# Patient Record
Sex: Female | Born: 1935 | Race: White | Hispanic: No | Smoking: Former smoker
Health system: Southern US, Community
[De-identification: ages and names within clinical notes are randomized; demographics above are authoritative.]

## PROBLEM LIST (undated history)

## (undated) DIAGNOSIS — K649 Unspecified hemorrhoids: Secondary | ICD-10-CM

## (undated) DIAGNOSIS — M1990 Unspecified osteoarthritis, unspecified site: Secondary | ICD-10-CM

## (undated) DIAGNOSIS — R32 Unspecified urinary incontinence: Secondary | ICD-10-CM

## (undated) DIAGNOSIS — L570 Actinic keratosis: Secondary | ICD-10-CM

## (undated) DIAGNOSIS — E785 Hyperlipidemia, unspecified: Secondary | ICD-10-CM

## (undated) DIAGNOSIS — C182 Malignant neoplasm of ascending colon: Secondary | ICD-10-CM

## (undated) DIAGNOSIS — R112 Nausea with vomiting, unspecified: Secondary | ICD-10-CM

## (undated) DIAGNOSIS — Z9889 Other specified postprocedural states: Secondary | ICD-10-CM

## (undated) DIAGNOSIS — G2581 Restless legs syndrome: Secondary | ICD-10-CM

## (undated) DIAGNOSIS — K222 Esophageal obstruction: Secondary | ICD-10-CM

## (undated) DIAGNOSIS — G709 Myoneural disorder, unspecified: Secondary | ICD-10-CM

## (undated) DIAGNOSIS — G4700 Insomnia, unspecified: Secondary | ICD-10-CM

## (undated) DIAGNOSIS — R252 Cramp and spasm: Secondary | ICD-10-CM

## (undated) DIAGNOSIS — K219 Gastro-esophageal reflux disease without esophagitis: Secondary | ICD-10-CM

## (undated) DIAGNOSIS — I10 Essential (primary) hypertension: Secondary | ICD-10-CM

## (undated) DIAGNOSIS — H409 Unspecified glaucoma: Secondary | ICD-10-CM

## (undated) DIAGNOSIS — F329 Major depressive disorder, single episode, unspecified: Secondary | ICD-10-CM

## (undated) DIAGNOSIS — K5909 Other constipation: Secondary | ICD-10-CM

## (undated) DIAGNOSIS — K5790 Diverticulosis of intestine, part unspecified, without perforation or abscess without bleeding: Secondary | ICD-10-CM

## (undated) DIAGNOSIS — F32A Depression, unspecified: Secondary | ICD-10-CM

## (undated) DIAGNOSIS — Z8719 Personal history of other diseases of the digestive system: Secondary | ICD-10-CM

## (undated) DIAGNOSIS — H3530 Unspecified macular degeneration: Secondary | ICD-10-CM

## (undated) DIAGNOSIS — C4490 Unspecified malignant neoplasm of skin, unspecified: Secondary | ICD-10-CM

## (undated) DIAGNOSIS — J449 Chronic obstructive pulmonary disease, unspecified: Secondary | ICD-10-CM

## (undated) DIAGNOSIS — H7390 Unspecified disorder of tympanic membrane, unspecified ear: Secondary | ICD-10-CM

## (undated) DIAGNOSIS — Z7982 Long term (current) use of aspirin: Secondary | ICD-10-CM

## (undated) HISTORY — PX: INTRAOPERATIVE ARTERIOGRAM: SHX5157

## (undated) HISTORY — PX: HEMORROIDECTOMY: SUR656

## (undated) HISTORY — PX: COLECTOMY: SHX59

## (undated) HISTORY — PX: MASTOID DEBRIDEMENT: SHX2002

## (undated) HISTORY — PX: CATARACT EXTRACTION: SUR2

## (undated) HISTORY — PX: CARDIAC CATHETERIZATION: SHX172

## (undated) HISTORY — PX: TONSILLECTOMY: SUR1361

## (undated) HISTORY — PX: LEG SURGERY: SHX1003

## (undated) HISTORY — PX: FOOT SURGERY: SHX648

## (undated) HISTORY — PX: CHOLECYSTECTOMY: SHX55

## (undated) HISTORY — PX: INNER EAR SURGERY: SHX679

## (undated) HISTORY — PX: TOE FUSION: SHX1070

## (undated) HISTORY — PX: SKIN CANCER EXCISION: SHX779

## (undated) HISTORY — PX: FINGER NAIL SURGERY: SHX717

## (undated) HISTORY — PX: BLADDER REPAIR: SHX76

## (undated) HISTORY — PX: ABDOMINAL HYSTERECTOMY: SHX81

## (undated) HISTORY — PX: KNEE SURGERY: SHX244

---

## 1985-12-14 DIAGNOSIS — K222 Esophageal obstruction: Secondary | ICD-10-CM

## 1985-12-14 HISTORY — DX: Esophageal obstruction: K22.2

## 2000-10-06 ENCOUNTER — Encounter: Admission: RE | Admit: 2000-10-06 | Discharge: 2000-10-06 | Payer: Self-pay | Admitting: Otolaryngology

## 2000-10-06 ENCOUNTER — Encounter: Payer: Self-pay | Admitting: Otolaryngology

## 2000-12-21 ENCOUNTER — Observation Stay (HOSPITAL_COMMUNITY): Admission: RE | Admit: 2000-12-21 | Discharge: 2000-12-22 | Payer: Self-pay | Admitting: Urology

## 2001-05-24 ENCOUNTER — Encounter: Payer: Self-pay | Admitting: Internal Medicine

## 2001-05-24 ENCOUNTER — Ambulatory Visit (HOSPITAL_COMMUNITY): Admission: RE | Admit: 2001-05-24 | Discharge: 2001-05-24 | Payer: Self-pay | Admitting: Internal Medicine

## 2001-10-25 ENCOUNTER — Encounter: Payer: Self-pay | Admitting: Internal Medicine

## 2001-10-25 ENCOUNTER — Encounter: Admission: RE | Admit: 2001-10-25 | Discharge: 2001-10-25 | Payer: Self-pay | Admitting: Internal Medicine

## 2001-11-22 ENCOUNTER — Ambulatory Visit (HOSPITAL_COMMUNITY): Admission: RE | Admit: 2001-11-22 | Discharge: 2001-11-22 | Payer: Self-pay | Admitting: Cardiology

## 2002-01-04 ENCOUNTER — Encounter: Payer: Self-pay | Admitting: Orthopedic Surgery

## 2002-01-04 ENCOUNTER — Encounter: Admission: RE | Admit: 2002-01-04 | Discharge: 2002-01-04 | Payer: Self-pay | Admitting: Orthopedic Surgery

## 2002-04-18 ENCOUNTER — Ambulatory Visit (HOSPITAL_COMMUNITY): Admission: RE | Admit: 2002-04-18 | Discharge: 2002-04-18 | Payer: Self-pay | Admitting: *Deleted

## 2003-03-27 ENCOUNTER — Ambulatory Visit (HOSPITAL_BASED_OUTPATIENT_CLINIC_OR_DEPARTMENT_OTHER): Admission: RE | Admit: 2003-03-27 | Discharge: 2003-03-28 | Payer: Self-pay | Admitting: Orthopedic Surgery

## 2003-11-28 ENCOUNTER — Ambulatory Visit (HOSPITAL_COMMUNITY): Admission: RE | Admit: 2003-11-28 | Discharge: 2003-11-28 | Payer: Self-pay | Admitting: *Deleted

## 2004-07-16 ENCOUNTER — Ambulatory Visit (HOSPITAL_COMMUNITY): Admission: RE | Admit: 2004-07-16 | Discharge: 2004-07-16 | Payer: Self-pay | Admitting: Orthopedic Surgery

## 2004-07-16 ENCOUNTER — Ambulatory Visit (HOSPITAL_BASED_OUTPATIENT_CLINIC_OR_DEPARTMENT_OTHER): Admission: RE | Admit: 2004-07-16 | Discharge: 2004-07-16 | Payer: Self-pay | Admitting: Orthopedic Surgery

## 2004-07-16 ENCOUNTER — Encounter (INDEPENDENT_AMBULATORY_CARE_PROVIDER_SITE_OTHER): Payer: Self-pay | Admitting: Orthopedic Surgery

## 2004-10-29 ENCOUNTER — Encounter: Admission: RE | Admit: 2004-10-29 | Discharge: 2004-10-29 | Payer: Self-pay | Admitting: Internal Medicine

## 2004-11-12 ENCOUNTER — Encounter: Admission: RE | Admit: 2004-11-12 | Discharge: 2004-11-12 | Payer: Self-pay | Admitting: Internal Medicine

## 2005-06-24 ENCOUNTER — Ambulatory Visit (HOSPITAL_BASED_OUTPATIENT_CLINIC_OR_DEPARTMENT_OTHER): Admission: RE | Admit: 2005-06-24 | Discharge: 2005-06-24 | Payer: Self-pay | Admitting: Orthopedic Surgery

## 2005-06-24 ENCOUNTER — Encounter (INDEPENDENT_AMBULATORY_CARE_PROVIDER_SITE_OTHER): Payer: Self-pay | Admitting: *Deleted

## 2005-06-24 ENCOUNTER — Ambulatory Visit (HOSPITAL_COMMUNITY): Admission: RE | Admit: 2005-06-24 | Discharge: 2005-06-24 | Payer: Self-pay | Admitting: Orthopedic Surgery

## 2005-06-30 ENCOUNTER — Ambulatory Visit (HOSPITAL_COMMUNITY): Admission: RE | Admit: 2005-06-30 | Discharge: 2005-06-30 | Payer: Self-pay | Admitting: Internal Medicine

## 2005-07-08 ENCOUNTER — Ambulatory Visit (HOSPITAL_COMMUNITY): Admission: RE | Admit: 2005-07-08 | Discharge: 2005-07-08 | Payer: Self-pay | Admitting: Orthopedic Surgery

## 2005-07-20 ENCOUNTER — Encounter: Admission: RE | Admit: 2005-07-20 | Discharge: 2005-07-20 | Payer: Self-pay | Admitting: Orthopedic Surgery

## 2005-07-23 ENCOUNTER — Ambulatory Visit (HOSPITAL_BASED_OUTPATIENT_CLINIC_OR_DEPARTMENT_OTHER): Admission: RE | Admit: 2005-07-23 | Discharge: 2005-07-23 | Payer: Self-pay | Admitting: Orthopedic Surgery

## 2005-07-23 ENCOUNTER — Encounter (INDEPENDENT_AMBULATORY_CARE_PROVIDER_SITE_OTHER): Payer: Self-pay | Admitting: Specialist

## 2005-07-23 ENCOUNTER — Ambulatory Visit (HOSPITAL_COMMUNITY): Admission: RE | Admit: 2005-07-23 | Discharge: 2005-07-23 | Payer: Self-pay | Admitting: Orthopedic Surgery

## 2006-01-11 ENCOUNTER — Ambulatory Visit: Payer: Self-pay | Admitting: Hematology & Oncology

## 2006-09-15 ENCOUNTER — Ambulatory Visit (HOSPITAL_BASED_OUTPATIENT_CLINIC_OR_DEPARTMENT_OTHER): Admission: RE | Admit: 2006-09-15 | Discharge: 2006-09-15 | Payer: Self-pay | Admitting: Orthopedic Surgery

## 2006-09-15 ENCOUNTER — Encounter (INDEPENDENT_AMBULATORY_CARE_PROVIDER_SITE_OTHER): Payer: Self-pay | Admitting: Specialist

## 2008-03-12 ENCOUNTER — Ambulatory Visit (HOSPITAL_COMMUNITY): Admission: RE | Admit: 2008-03-12 | Discharge: 2008-03-12 | Payer: Self-pay | Admitting: Internal Medicine

## 2008-04-04 ENCOUNTER — Ambulatory Visit: Payer: Self-pay | Admitting: Vascular Surgery

## 2008-06-29 ENCOUNTER — Ambulatory Visit: Payer: Self-pay | Admitting: Vascular Surgery

## 2008-07-25 ENCOUNTER — Ambulatory Visit: Payer: Self-pay | Admitting: Vascular Surgery

## 2008-08-01 ENCOUNTER — Ambulatory Visit: Payer: Self-pay | Admitting: Vascular Surgery

## 2008-10-24 ENCOUNTER — Ambulatory Visit: Payer: Self-pay | Admitting: Vascular Surgery

## 2008-10-31 ENCOUNTER — Ambulatory Visit: Payer: Self-pay | Admitting: Vascular Surgery

## 2008-11-16 ENCOUNTER — Ambulatory Visit: Payer: Self-pay | Admitting: Vascular Surgery

## 2008-11-28 ENCOUNTER — Encounter (INDEPENDENT_AMBULATORY_CARE_PROVIDER_SITE_OTHER): Payer: Self-pay | Admitting: *Deleted

## 2008-11-28 ENCOUNTER — Inpatient Hospital Stay (HOSPITAL_COMMUNITY): Admission: EM | Admit: 2008-11-28 | Discharge: 2008-11-30 | Payer: Self-pay | Admitting: Emergency Medicine

## 2009-03-10 ENCOUNTER — Inpatient Hospital Stay (HOSPITAL_COMMUNITY): Admission: EM | Admit: 2009-03-10 | Discharge: 2009-03-12 | Payer: Self-pay | Admitting: Emergency Medicine

## 2010-01-01 ENCOUNTER — Ambulatory Visit (HOSPITAL_BASED_OUTPATIENT_CLINIC_OR_DEPARTMENT_OTHER): Admission: RE | Admit: 2010-01-01 | Discharge: 2010-01-01 | Payer: Self-pay | Admitting: Orthopedic Surgery

## 2010-02-04 ENCOUNTER — Encounter: Admission: RE | Admit: 2010-02-04 | Discharge: 2010-02-04 | Payer: Self-pay | Admitting: Internal Medicine

## 2010-04-17 ENCOUNTER — Ambulatory Visit (HOSPITAL_COMMUNITY): Admission: RE | Admit: 2010-04-17 | Discharge: 2010-04-17 | Payer: Self-pay | Admitting: Orthopedic Surgery

## 2010-10-30 ENCOUNTER — Encounter: Admission: RE | Admit: 2010-10-30 | Discharge: 2010-10-30 | Payer: Self-pay | Admitting: Orthopedic Surgery

## 2010-11-04 ENCOUNTER — Ambulatory Visit (HOSPITAL_BASED_OUTPATIENT_CLINIC_OR_DEPARTMENT_OTHER): Admission: RE | Admit: 2010-11-04 | Discharge: 2010-11-04 | Payer: Self-pay | Admitting: Orthopedic Surgery

## 2010-12-30 ENCOUNTER — Other Ambulatory Visit: Payer: Self-pay | Admitting: Dermatology

## 2011-01-03 ENCOUNTER — Encounter: Payer: Self-pay | Admitting: Internal Medicine

## 2011-01-04 ENCOUNTER — Encounter: Payer: Self-pay | Admitting: Internal Medicine

## 2011-02-02 NOTE — Op Note (Signed)
NAMEKAREMA, TOCCI               ACCOUNT NO.:  [id number]  MEDICAL RECORD NO.:  [id number]          PATIENT TYPE:  AMB  LOCATION:  [place]                          FACILITY:  [facility]  PHYSICIAN:  Daryll Brod, M.D.       DATE OF BIRTH:  1936/09/13  DATE OF PROCEDURE:  11/04/2010 DATE OF DISCHARGE:                              OPERATIVE REPORT   PREOPERATIVE DIAGNOSIS:  Lesion in left thumb nail.  POSTOPERATIVE DIAGNOSIS:  Lesion in left thumb nail.  OPERATION:  Excision of lesion left thumb with full-thickness skin graft, ablation of nail matrix.  SURGEON:  Daryll Brod, MD  ASSISTANT:  Dimas Millin, RN  ANESTHESIA:  General with local infiltration.  ANESTHESIOLOGIST:  Lorrene Reid, MD  HISTORY:  The patient is a 75 year old female with a history of a mass on her left thumb partially beneath the nail plate.  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  She is admitted now for further excision with skin grafting, ablation of the nail matrix, left thumb.  She is well aware of risks and complications including infection, recurrence injury to arteries, nerves, tendons, incomplete relief of symptoms, dystrophy, possibility that this may have transformed into a squamous cell carcinoma that she has had the similar occurrence of that problem on her right thigh.  The patient was seen in the preoperative area, the extremity marked by both the patient and surgeon.  Antibiotic given.  PROCEDURE:  The patient was brought to the operating room where a general anesthetic was carried out without difficulty.  She was prepped using ChloraPrep, supine position with the left arm free.  A 3-minute dry time was allowed.  Time-out taken confirming the patient and procedure.  The limb was exsanguinated from the wrist proximally with an Esmarch bandage.  Tourniquet placed high and the arm was inflated to 250 mmHg.  The nail plate was removed.  The nail matrix and tumor were removed en  bloc with a margin of approximately 5 mm around the entire tumor.  This was taken down to a thin layer of tissue over the bone proximally and distally taking care to try to remove the entire tumor as much as possible.  A template was then made with an Esmarch bandage. This was cleaned.  The instruments were changed.  A full-thickness skin graft was made from the upper inner arm after creating the template, changing this to an ellipse.  This was taken as a full-thickness skin graft.  Bleeders were electrocauterized with bipolar.  The skin undermined and closed with interrupted 4-0 Vicryl Rapide sutures.  The thumb tip was then copiously irrigated with saline.  Bleeders electrocauterized.  The thumb graft was then sutured into position with interrupted 6-0 chromic and then completed with a running 6-0 chromic around the entire margin.  A stent dressing was then applied over nonadherent gauze, maintained position with Steri-Strips.  Compressive dressing and splint was applied.  A sterile compressive dressing was applied to the graft site.  Upon deflation of the tourniquet, the remaining fingers pinked and she was taken to the recovery room for observation in a satisfactory  condition.  Specimen is sent in toto to Pathology.          ______________________________ Daryll Brod, M.D.     GK/MEDQ  D:  11/04/2010  T:  11/04/2010  Job:  [id number]  Electronically Signed by Daryll Brod M.D. on 02/02/2011 12:26:04 PM

## 2011-02-24 LAB — BASIC METABOLIC PANEL
BUN: 18 mg/dL (ref 6–23)
CO2: 26 mEq/L (ref 19–32)
Chloride: 104 mEq/L (ref 96–112)
Creatinine, Ser: 1.08 mg/dL (ref 0.4–1.2)
Glucose, Bld: 94 mg/dL (ref 70–99)

## 2011-03-01 LAB — BASIC METABOLIC PANEL
BUN: 24 mg/dL — ABNORMAL HIGH (ref 6–23)
CO2: 28 mEq/L (ref 19–32)
Chloride: 105 mEq/L (ref 96–112)
Creatinine, Ser: 0.98 mg/dL (ref 0.4–1.2)
Glucose, Bld: 91 mg/dL (ref 70–99)

## 2011-03-03 LAB — PROTIME-INR: Prothrombin Time: 13.5 seconds (ref 11.6–15.2)

## 2011-03-03 LAB — BASIC METABOLIC PANEL
BUN: 23 mg/dL (ref 6–23)
CO2: 26 mEq/L (ref 19–32)
Chloride: 106 mEq/L (ref 96–112)
Creatinine, Ser: 0.92 mg/dL (ref 0.4–1.2)
Potassium: 3.3 mEq/L — ABNORMAL LOW (ref 3.5–5.1)

## 2011-03-03 LAB — CBC
HCT: 34.3 % — ABNORMAL LOW (ref 36.0–46.0)
MCHC: 35.2 g/dL (ref 30.0–36.0)
MCV: 95 fL (ref 78.0–100.0)
Platelets: 256 10*3/uL (ref 150–400)

## 2011-03-26 LAB — CBC
Hemoglobin: 11.8 g/dL — ABNORMAL LOW (ref 12.0–15.0)
MCHC: 34.6 g/dL (ref 30.0–36.0)
RBC: 3.64 MIL/uL — ABNORMAL LOW (ref 3.87–5.11)
RBC: 3.76 MIL/uL — ABNORMAL LOW (ref 3.87–5.11)
RDW: 13.2 % (ref 11.5–15.5)
WBC: 7.6 10*3/uL (ref 4.0–10.5)

## 2011-03-26 LAB — DIFFERENTIAL
Eosinophils Absolute: 0 10*3/uL (ref 0.0–0.7)
Eosinophils Relative: 0 % (ref 0–5)
Lymphocytes Relative: 30 % (ref 12–46)
Lymphs Abs: 0.5 10*3/uL — ABNORMAL LOW (ref 0.7–4.0)
Monocytes Absolute: 0.7 10*3/uL (ref 0.1–1.0)
Monocytes Relative: 0 % — ABNORMAL LOW (ref 3–12)
Monocytes Relative: 9 % (ref 3–12)
Neutro Abs: 4.3 10*3/uL (ref 1.7–7.7)

## 2011-03-26 LAB — POCT I-STAT, CHEM 8
BUN: 22 mg/dL (ref 6–23)
Creatinine, Ser: 1.3 mg/dL — ABNORMAL HIGH (ref 0.4–1.2)
Glucose, Bld: 97 mg/dL (ref 70–99)
Potassium: 3.9 mEq/L (ref 3.5–5.1)
TCO2: 26 mmol/L (ref 0–100)

## 2011-03-26 LAB — URINALYSIS, ROUTINE W REFLEX MICROSCOPIC
Bilirubin Urine: NEGATIVE
Nitrite: NEGATIVE
Specific Gravity, Urine: 1.02 (ref 1.005–1.030)
pH: 5.5 (ref 5.0–8.0)

## 2011-03-26 LAB — POCT CARDIAC MARKERS
CKMB, poc: <1 ng/mL — ABNORMAL LOW (ref 1.0–8.0)
Troponin i, poc: <0.05 ng/mL (ref 0.00–0.09)

## 2011-03-26 LAB — HEMOGLOBIN A1C: Mean Plasma Glucose: 94 mg/dL

## 2011-03-26 LAB — COMPREHENSIVE METABOLIC PANEL
ALT: 15 U/L (ref 0–35)
AST: 24 U/L (ref 0–37)
CO2: 23 mEq/L (ref 19–32)
Calcium: 9.2 mg/dL (ref 8.4–10.5)
GFR calc Af Amer: 52 mL/min — ABNORMAL LOW (ref >60)
Potassium: 4.5 mEq/L (ref 3.5–5.1)
Sodium: 137 mEq/L (ref 135–145)
Total Protein: 6.7 g/dL (ref 6.0–8.3)

## 2011-03-26 LAB — CARDIAC PANEL(CRET KIN+CKTOT+MB+TROPI)
CK, MB: 0.7 ng/mL (ref 0.3–4.0)
Relative Index: INVALID (ref 0.0–2.5)
Total CK: 24 U/L (ref 7–177)

## 2011-03-26 LAB — LIPID PANEL
Cholesterol: 151 mg/dL (ref 0–200)
HDL: 26 mg/dL — ABNORMAL LOW (ref >39)
LDL Cholesterol: 95 mg/dL (ref 0–99)
Total CHOL/HDL Ratio: 5.8 RATIO
Triglycerides: 149 mg/dL (ref <150)

## 2011-03-31 ENCOUNTER — Other Ambulatory Visit: Payer: Self-pay | Admitting: Dermatology

## 2011-04-28 NOTE — H&P (Signed)
Erica Escobar, Erica Escobar               ACCOUNT NO.:  [id number]   MEDICAL RECORD NO.:  [id number]          PATIENT TYPE:  EMS   LOCATION:  ED                           FACILITY:  [facility]   PHYSICIAN:  Barbette Merino, M.D.      DATE OF BIRTH:  Jun 01, 1936   DATE OF ADMISSION:  11/26/2008  DATE OF DISCHARGE:                              HISTORY & PHYSICAL   PRIMARY CARE PHYSICIAN:  Dr. Deland Pretty.   CARDIOLOGIST:  Dr. Glade Lloyd.   PRESENTING COMPLAINT:  Left-sided chest pain for 2 days.   HISTORY OF PRESENT ILLNESS:  The patient is a 75 year old female with  history of hypertension and dyslipidemia as well as gastroesophageal  reflux disease who has been having chest pain for the past 2 days.  The  chest pain is mainly left-sided.  It has been coming on and off  apparently for a long time, usually nonexertional, but occasionally  exertional as well.  Two days ago when it came, it was mainly left-sided  rated as 4-5/10.  It started from the epigastric region, and then  radiated down to her back along the sides of her rib cage.  It was  associated with some diaphoresis and nausea.  The patient spent all of  yesterday in bed, and decided to go to see her allergist today thinking  this was asthma.  She was seen and evaluated by her allergist who  actually sent her to the ER for evaluation of chest pain.  She had a  similar episode 2 years ago at which point she was evaluated by Dr.  Glade Lloyd, and had a cardiac cath done that was cleared according to  patient.  She has some risk factors for coronary artery disease  including hypertension, dyslipidemia.  Also had some family history of  heart disease, although this is advanced age.  She denied any tobacco  use.   ALLERGIES:  She is allergic to CODEINE, PENICILLIN, VICODIN, and  TIZANIDINE.   MEDICATIONS:  1. Gemfibrozil 600 mg p.o. b.i.d.  2. Amiloride 5 mg p.o. q.h.s.  3. Ropinirole 1 mg daily.  4. Potassium chloride 10 mEq daily.  5.  Lasix 40 mg daily.  6. Omeprazole 20 mg daily.  7. Aspirin 81 mg daily.  8. Fish oil.  9. Vitamin B12 liver.  10.Albuterol inhaler.  11.Aleve as needed.   SOCIAL HISTORY:  The patient is married, lives in [place], [place], with her husband.  Denied any to tobacco, alcohol or IV drug  use.   FAMILY HISTORY:  Both parents have history of heart disease, but at  advanced ages.  Mom was diabetic.  Father had some lung disease.  They  are both deceased.   REVIEW OF SYSTEMS:  A 14-point review of system attempted and  essentially negative except per HPI.   PHYSICAL EXAMINATION:  Temperature is 97.4, initial blood pressure  172/88 currently 146/90, pulse 66, respiratory rate 20, sats 98% on room  air.  GENERAL:  She is awake, alert, oriented, in no acute distress.  HEENT:  PERRL, EOMI.  NECK:  Supple.  No JVD, no lymphadenopathy.  RESPIRATORY:  Shows good air entry bilaterally, no wheezes, no rales.  CARDIOVASCULAR:  She has S1, S2, no murmurs.  ABDOMEN:  Soft, nontender with positive bowel sounds.  EXTREMITIES:  Show no edema, cyanosis or clubbing.   LABORATORIES:  White count is 11.7, hemoglobin 12.8 with platelet count  325 with an ANC of 8.6.  Initial cardiac enzymes negative.  Sodium 138,  potassium 3.9, chloride 101, CO2 29, glucose 110, BUN 24, creatinine  1.08.  Calcium 8.8.   Her chest x-ray showed no acute findings except for mild cardiomegaly.  EKG showed a rate of 66 with regular intervals.  Possible evidence of  LVH but poor voltage or other low voltage.  She had nonspecific ST-T  wave changes mainly flattening of her T-waves in the lateral wall, but  there is no old EKG to compare.   ASSESSMENT:  1. Therefore this is a 75 year old female presenting with nonspecific      chest pain.  The patient has some risk factors for coronary artery      disease, although she had a negative cardiac cath 2 years ago.      With this in mind, we will proceed to admit the  patient mainly for      observation and also to rule out myocardial infarction.  We will      check serial cardiac enzymes x3.  If negative will get Dr. Glade Lloyd      to possibly do a stress test on the patient.  2. Gastroesophageal reflux disease.  This may be responsible for      patient's symptoms.  Will continue with proton pump inhibitors and      caution her against taking non-steroidal anti-inflammatory drugs      like Aleve that she is taking right now.  3. Hypertension.  Will continue with her home blood pressure therapy.      I will put her on some nitroglycerin sublingual as needed for chest      pain.  4. Dyslipidemia.  I will check a fasting lipid panel, and continue      with her gemfibrozil.  5. Asthma.  The patient is not having any exacerbation at this point.      Will continue with scheduled nebulizers while she is in the      hospital.  Otherwise, further treatment will depend on how the      patient responds to our initial measures.      Barbette Merino, M.D.  Electronically Signed     LG/MEDQ  D:  11/26/2008  T:  11/27/2008  Job:  [id number]

## 2011-04-28 NOTE — Consult Note (Signed)
Erica Escobar, Erica Escobar               ACCOUNT NO.:  [id number]   MEDICAL RECORD NO.:  [id number]          PATIENT TYPE:  OBV   LOCATION:  1410                         FACILITY:  [facility]   PHYSICIAN:  Ezzard Standing, M.D.DATE OF BIRTH:  10-26-1936   DATE OF CONSULTATION:  11/27/2008  DATE OF DISCHARGE:                                 CONSULTATION   REASON FOR CONSULTATION:  Chest discomfort.   This 75 year old female is seen at the request of the [facility]  physicians for evaluation of chest discomfort and diaphoresis.  The  patient is a somewhat poor historian.  She has a longstanding history of  esophageal reflux, asthma, hypertension and mild hyperlipidemia.  About  2 years ago following surgery on her hands she developed some chest  discomfort and describes what sounds like a cardiac catheterization and  was told she had the heart of a 75 year old.  There are no records of  the catheterization within the [facility] system but she clearly  describes one.  She did relatively well following that but recently had  some sclerotherapy of both lower extremities by Dr. Donnetta Hutching.  She  developed midsternal chest discomfort with some radiation down to the  left lower chest area, some accompanied with diaphoresis and sweating.  She saw her asthma doctor the next day who told her asthma was not  giving her problems and recommended that she come to the [facility]  Emergency Room.  He recommend that she go to the emergency room.  The  emergency room EKG showed T-wave inversions in V2-V5 but no EKG was  available for comparison.  She was admitted to the hospital and  myocardial infarction was ruled out.  She has had negative serial  enzymes but no follow-up EKGs.  I was asked to see her and I am seeing  her in consult today.  Normally she feels reasonably well.   PAST HISTORY:  Remarkable for:  1. Esophageal reflux.  2. Asthma.  3. Osteoarthritis.  4. Previous history of squamous cell cancer.  5. Hypertension.   PAST SURGICAL HISTORY:  1. Hysterectomy.  2. Appendectomy.  3. Carpal tunnel syndrome.  4. Mastoid surgery on her ear.  5. Bilateral surgery for squamous cell carcinoma.  6. A right knee arthroscopy.   ALLERGIES:  1. VICODIN.  2. PENICILLIN.  3. CODEINE.   MEDICATIONS PRIOR TO ADMISSION:  1. Gemfibrozil 600 b.i.d.  2. Amlodipine 5 mg nightly.  3. Potassium.  4. Furosemide.   FAMILY HISTORY:  Father died of lymphoma at age 68.  Mother died of  heart disease and diabetes at age 24.  One brother died of lung cancer  at age 80, one brother who has had a previous history of cardiac arrest  and bypass grafting.   SOCIAL HISTORY:  She was divorced from her first her first husband, is  remarried to her second husband about 26 years.  She formally works for  [employer].  She has no significant cigarette smoking  history as an adult.  Smoked briefly when she was pregnant with her  child, has 2  children.  Does not use alcohol.  One child died previously  of possible pulmonary embolus.   REVIEW OF SYSTEMS:  Obese for several years.  She has no eye problems.  She has previous history of mastoiditis.  She has no difficulty  swallowing.  She had an endoscopy and colonoscopy several years ago by  Dr. Lajoyce Corners and takes omeprazole.  She complains of some incontinence.  She  complains of arthritis mainly involving her knee.  There is no history  of stroke or TIA.  She has recently had a vein sclerotherapy done with  laser by Dr. Donnetta Hutching.  She does complain of some pleuritic chest pain now,  had a negative chest x-ray on admission.   On Examination, she is a pleasant, obese female appearing stated age in  is talkative.  Blood pressure is 170/65.  Pulse is 73.  Temperature was normal.  Oxygen  saturations were normal.  SKIN:  Warm and dry.  ENT:  EOMI.  PERRLA.  CNS:  Clear.  Fundi not examined.  Pharynx negative.  NECK:  Supple without masses, JVD, thyromegaly  or bruits.  There is no  carotid bruit noted.  LUNGS:  Clear to A and P.  CARDIOVASCULAR:  Exam showed normal S1, S2, no S3, S4 or murmur.  ABDOMEN:  Soft and nontender.  Femoral pulse 2+, peripheral pulses are 2+, trace edema was noted.   A 12-lead EKG shows sinus rhythm with T-wave inversions in the anterior  leads.  Chest x-ray showed no acute disease.   LABORATORY DATA:  Unremarkable except for mildly elevated glucose.   IMPRESSION:  1. Chest discomfort with some features suggestive of unstable angina.      She does have an abnormal baseline EKG but negative enzymes.      Reports a catheterization 2 years ago but I cannot confirm this.      This will need to be confirmed with Dr. Pennie Banter office.  2. Abnormal baseline EKG.  3. Recent vein sclerotherapy.  4. Hypertension.  5. History of asthma.  6. Esophageal reflux.  7. Hyperlipidemia under treatment with gemfibrozil.  8. Venous insufficiency.   RECOMMENDATIONS:  Keep n.p.o. for now.  I would continue aspirin and  other treatment medication.  I would keep her n.p.o. after midnight.  I  will confirm records at Dr. Pennie Banter office tomorrow.  She may need a  repeat catheterization depending on whether we can find the results of  the one done a couple of years ago.  In the meantime, I would check a D-  dimer and a CT scan to rule out a pulmonary embolus.  Obtain  echocardiogram.      Ezzard Standing, M.D.  Electronically Signed     WST/MEDQ  D:  11/27/2008  T:  11/28/2008  Job:  [id number]   cc:   Lynnell Chad. Shelia Media, M.D.  Fax: [phone]

## 2011-04-28 NOTE — Discharge Summary (Signed)
Erica Escobar, Erica Escobar               ACCOUNT NO.:  [id number]   MEDICAL RECORD NO.:  [id number]          PATIENT TYPE:  INP   LOCATION:  1416                         [facility]ACILITY[facility]  [facility]   PHYSICIAN:  Leana Gamer, MDDATE OF BIRTH:  14-Feb-1936   DATE OF ADMISSION:  03/10/2009  DATE OF DISCHARGE:  03/12/2009                               DISCHARGE SUMMARY   DISCHARGE DISPOSITION:  Home.   FINAL DISCHARGE DIAGNOSES:  1. Acute sinusitis.  2. Chronic bronchitis.  3. Cough.  4. Status post fall.  5. Sinus congestion.  6. History of hypertension.  7. History of coronary artery disease.  8. History of gastroesophageal reflux disease.  9. History of dyslipidemia.  10.History of asthma.  11.History of osteoarthritis.  12.History of squamous cell carcinoma.   DISCHARGE MEDICATIONS:  1. Levaquin 750 mg p.o. daily x4 days.  2. Tussionex PK 5 mL p.o. q.12 h. p.r.n. cough; not to exceed 5 days      consecutively.  3. Lopid 600 mg p.o. b.i.d.  4. Amiloride 5 mg p.o. daily.  5. Potassium chloride 10 mEq p.o. daily.  6. Polyethylene glycol daily p.r.n.  7. Lasix 20 mg p.o. daily.  8. Omeprazole 20 mg p.o. daily.  9. Sertraline 50 mg p.o. daily.  10.Zocor 20 mg p.o. at bedtime.  11.Aspirin 81 mg p.o. daily.  12.Multivitamin 1 tablet p.o. daily.  13.Aleve 2 tabs p.o. daily.  14.ProAir HFA inhaled as needed.  15.Claritin 1 tablet p.o. daily p.r.n.  16.Darvocet 1 tablet p.o. q.8 h. p.r.n.   CONSULTANTS:  None.   PROCEDURES:  None.   DIAGNOSTIC STUDIES:  1. Chest x-ray two-view done on admission shows no active      cardiopulmonary disease.  2. CT of the head without contrast shows negative CT cranial      examination.  3. CT of C-spine without contrast shows no acute C-spine findings.  4. CT of the maxillofacial area shows no acute facial bone injury.      Right maxillary chronic sinusitis and left maxillary and sphenoid      acute sinusitis.   ALLERGIES:  1. PENICILLIN.  2.  CODEINE.  3. VICODIN.  4. ZANAFLEX.   CODE STATUS:  Full code.   PRIMARY CARE PHYSICIAN:  Dr. Deland Pretty   CHIEF COMPLAINT:  Generalized weakness and fall.   HISTORY OF PRESENT ILLNESS:  Please refer to the history and physical by  Dr. Albertine Grates for details of the HPI.   HOSPITAL COURSE:  The patient was admitted and found to have acute  sinusitis and started on IV Levaquin.  The patient was also given a dose  of steroids in the emergency room.  However, there was no positive  improvement in the patient's condition.  The patient states that she has  been on a recent tapering course of prednisone, and the patient had no  sequela of an exacerbation of asthma.  Thus, the prednisone was  discontinued.  The patient did complain of sinus congestion and was  treated with pseudoephedrine for 24 hours with some relief in sinus  congestion.  The most  compelling symptom for the patient is cough, and  she has been given Tussionex which has alleviated the cough.  The  patient is being sent home on Tussionex for no more than 5 days.  Otherwise, the patient has remained stable and is to continue her other  medications.  Of note, the patient did have positive orthostatics but  complained of no dizziness.  She also reiterates that her fall was not  secondary to dizziness or passing out but rather because she tripped and  fell on her face.   DIETARY RESTRICTIONS:  The patient should be on a heart-healthy 2 gram  sodium diet.   PHYSICAL RESTRICTIONS:  None.  The patient was evaluated by physical  therapy and occupational therapy while hospitalized, and no needs are  identified at this time.   FOLLOWUP:  The patient is to follow with Dr. Shelia Media in 1 week.   Total time for discharge was 31 minutes.      Leana Gamer, MD  Electronically Signed     MAM/MEDQ  D:  03/12/2009  T:  03/12/2009  Job:  [id number]

## 2011-04-28 NOTE — Procedures (Signed)
LOWER EXTREMITY VENOUS REFLUX EXAM   INDICATION:  Bilateral varicose vein pain.   EXAM:  Using color-flow imaging and pulse Doppler spectral analysis, the  right and left common femoral, superficial femoral, popliteal, posterior  tibial, greater and lesser saphenous veins are evaluated.  There is no  evidence suggesting deep venous insufficiency in the right and left  lower extremity.   The right and left saphenofemoral junctions are not competent.  The  right and left GSV's are not competent with the caliber as described  below.   The right and left proximal short saphenous vein demonstrates  competency.   GSV Diameter (used if found to be incompetent only)                                            Right    Left  Proximal Greater Saphenous Vein           0.78 cm  0.78 cm  Proximal-to-mid-thigh                     cm       cm  Mid thigh                                 0.46 cm  0.48 cm  Mid-distal thigh                          cm       cm  Distal thigh                              0.70 cm  0.39 cm  Knee                                      0.44 cm  0.41 cm   IMPRESSION:  1. The right greater saphenous vein reflux is identified with the      caliber ranging from 0.44 cm to 0.78 cm knee to groin.  The left      ranging from 0.39 cm to 0.78 cm.  2. The right and left greater saphenous veins are not aneurysmal.  3. The right and left greater saphenous veins are not tortuous.  4. The deep venous system is competent without evidence of deep venous      thrombosis.  5. The right and left lesser saphenous veins are competent.   ___________________________________________  Rosetta Posner, M.D.   AS/MEDQ  D:  06/29/2008  T:  06/29/2008  Job:  [id number]

## 2011-04-28 NOTE — H&P (Signed)
Erica Escobar, Erica Escobar               ACCOUNT NO.:  [id number]   MEDICAL RECORD NO.:  [id number]          PATIENT TYPE:  INP   LOCATION:  0104                         FACILITY:  [facility]   PHYSICIAN:  Remer Macho, MD        DATE OF BIRTH:  1936-06-04   DATE OF ADMISSION:  03/10/2009  DATE OF DISCHARGE:                              HISTORY & PHYSICAL   PRIMARY CARE PHYSICIAN:  Dr. Deland Pretty.   CHIEF COMPLAINT:  Generalized weakness/fall.   HISTORY OF PRESENT ILLNESS:  The patient is a 75 year old Caucasian  female who presents to the emergency room with complaints of weakness.  Apparently the patient fell from her bed two nights ago with injuries to  her head and neck but did not to come to the emergency room at that  time.  Her son who visited her today found her having increasing cough  and shortness of breath in addition to an injury from her recent fall  and decided to get her to the emergency room.  Upon further questioning  the patient states that she has had symptoms since the 14th of this  month.  At this time she was diagnosed with an upper respiratory  infection and treated with Zithromax which did not improve her symptoms  at all.  She has had continuous nasal drainage, has been coughing  incessantly causing her to have some chest discomfort as a result, and  her weakness has progressively gotten worse over the last 2 weeks.  She  also complains of headache, occasional chest tightness, and increasing  shortness of breath.  No palpitations or weakness, any loss of  consciousness or any focal weakness of any part of the body.   PAST MEDICAL HISTORY:  Significant for:  1. Hypertension.  2. Coronary artery disease.  3. GERD.  4. Dyslipidemia.  5. Asthma.  6. Osteoarthritis.  7. History of squamous cell carcinoma.   PAST SURGICAL HISTORY:  1. Recent cardiac catheterization in December 2009.  2. History of surgical removal of squamous cell carcinoma.  3. History of  pubovaginal sling with DuraMatrix.   CURRENT MEDICATIONS:  1. Gemfibrozil 600 mg twice a day.  2. Amiloride 5 mg p.o. nightly.  3. Potassium 10 mEq daily.  4. Lasix 20 mg daily.  5. Omeprazole 20 mg daily.  6. Aspirin 81 mg daily.  7. Vitamin.  8. Fish oil daily.  9. Albuterol inhaler as needed.  10.Aleve as needed.  11.Zoloft 50 mg daily.   ALLERGIES:  1. CODEINE.  2. PENICILLIN.  3. VICODIN.  4. TIZANIDINE.   SOCIAL HISTORY:  The patient is married, lives in [place], [place] with her husband.  Denies any alcohol, tobacco or drug use.  Is  independent in all her ADLs.   FAMILY HISTORY:  Both parents have a history of heart disease.  At  advanced age, mom was a diabetic.  Father had some lung disease.  They  are both deceased.   REVIEW OF SYSTEMS:  Extensive review of systems was done, and all  systems are negative except for the positives  mentioned in the history  of present illness.   PHYSICAL EXAMINATION:  VITALS:  On admission, pulse 68, blood pressure  131/68, respiratory rate of 20, O2 saturations of 95% on room air,  temperature 98.9.  GENERAL:  Elderly Caucasian female.  Alert and oriented to time, place  and person.  Mild to moderate distress, coughing frequently.  HEENT:  No scleral icterus.  Mild pallor.  Ears are negative.  Poor  dental hygiene.  NECK:  Supple.  No lymphadenopathy.  No JVD.  No carotid bruit.  CHEST:  Breath sounds heard bilaterally.  Ocean air entry.  Bilateral  rhonchi.  CVS: S1, S2, regular.  No gallop or rub.  ABDOMEN:  Soft, obese, nontender.  Bowel sounds present.  EXTREMITIES: No cyanosis, clubbing or edema.  NEUROLOGIC EXAMINATION:  Cranial nerves 2-12 are grossly intact.  No  focal motor or sensory deficits noted on gross examination.  SKIN:  There is a laceration to the bridge of the nose.   LABORATORY DATA:  Her WBC count was 7.6, hemoglobin 9.8, hematocrit  34.2, platelet count of 260.  Her troponin was less than  0.05.  Urinalysis was negative.  Her sodium was 142, potassium 3.9, chloride  107.  Glucose 97, BUN 22, creatinine 1.3.  Her CT of the head was  negative.  CT of the maxillofacial area revealed no acute facial bone  injury, left maxillary and sphenoid sinusitis and chronic maxillary  sinusitis.  CT of the cervical spine showed spondylosis.  Chest x-ray  showed no acute cardiopulmonary disease.   IMPRESSION AND PLAN:  This is a 75 year old Caucasian female with  history of hypertension, asthma and hyperlipidemia who presents with  persistent upper respiratory symptoms for the last 2 weeks and  generalized weakness and recent fall.  1. Asthma exacerbation/sinusitis.  Given the patient's persistent      symptoms even in the absence of her having a white count, fever or      x-ray evidence of pneumonia, we will go ahead and treat her with      antibiotics.  As she is allergic to penicillin, will put her on      Levaquin 750 mg IV daily and start her on Xopenex and Atrovent      nebulizers.  Give her a dose of intravenous Solu-Medrol in the ER      and monitor closely.  2. Generalized weakness secondary to orthostasis.  The patient was      noted to be orthostatic in the ER.  She is started on IV fluids.      We will hold her Lasix for now and monitor closely.  3. Hypertension as mentioned above.  The patient is orthostatic, and      her blood pressure is otherwise controlled.  For now we will      continue her on her current medications including Norvasc and      monitor.  4. Coronary artery disease.  The patient has had a negative recent      catheterization.  Her chest discomfort is      atypical.  EKG does not show any new changes.  We will cycle      enzymes and follow up.  She will not need any further workup if her      enzymes are negative.  5. Deep venous thrombosis and gastrointestinal prophylaxis.  Protonix      and subcutaneous heparin.      Remer Macho, MD   Electronically  Signed     BA/MEDQ  D:  03/10/2009  T:  03/10/2009  Job:  [id number]   cc:   Lynnell Chad. Shelia Media, M.D.  Fax: [phone]

## 2011-04-28 NOTE — Procedures (Signed)
DUPLEX DEEP VENOUS EXAM - LOWER EXTREMITY   INDICATION:  Followup right greater saphenous vein ablation.   HISTORY:  Edema:  Yes.  Trauma/Surgery:  Yes.  Pain:  Mild.  PE:  No.  Previous DVT:  No.  Anticoagulants:  Other:   DUPLEX EXAM:                CFV   SFV   PopV  PTV    GSV                R  L  R  L  R  L  R   L  R  L  Thrombosis    0  0  0     0     0      +  Spontaneous   +  +  +     +     +      0  Phasic        +  +  +     +     +      0  Augmentation  +  +  +     +     +      0  Compressible  +  +  +     +     +      0  Competent     +  +  +     +     +      0   Legend:  + - yes  o - no  p - partial  D - decreased   IMPRESSION:  1. No evidence of DVT noted in the right leg.  2. The right greater saphenous vein appears ablated (occluded) from      proximal thigh to distal.    _____________________________  Rosetta Posner, M.D.   MG/MEDQ  D:  08/01/2008  T:  08/01/2008  Job:  [id number]

## 2011-04-28 NOTE — Discharge Summary (Signed)
Erica Escobar, Erica Escobar               ACCOUNT NO.:  [id number]   MEDICAL RECORD NO.:  [id number]          PATIENT TYPE:  INP   LOCATION:                               FACILITY:  [facility]   PHYSICIAN:  Girard Cooter, MD         DATE OF BIRTH:  [birth year]   DATE OF ADMISSION:  11/29/2008  DATE OF DISCHARGE:  11/30/2008                               DISCHARGE SUMMARY   DISCHARGE DIAGNOSES:  1. Nonspecific atypical chest pain.  2. Coronary artery disease ruled out, status post cardiac      catheterization on November 29, 2008, with no significant coronary      artery disease, normal left ventricular function.  3. Gastroesophageal reflux disease, stable.  4. Hypertension, stable.  5. Dyslipidemia.  6. Asthma, stable.  7. Osteoarthritis.  8. History of squamous cell cancer.   BRIEF HISTORY OF PRESENT ILLNESS:  The patient is a 75 year old who  represented with chest discomfort and diaphoresis on day of admission,  November 28, 2008.  She has a long history of gastroesophageal reflux  disease and asthma.  She was admitted to the medical telemetry unit.  Cardiac enzymes were cycled.  Cardiology consultation was requested.  The patient underwent catheterization which was negative, results above.  The patient remained chest pain free.   CONSULTANT:  Dr. Wynonia Lawman.   PROCEDURE:  The patient had a cardiac catheterization on November 29, 2008, which showed normal coronary arteries, no significant  calcification, left main artery normal, left anterior descending largely  extends to the apex and appears normal.  No calcification or  irregularities.  Normal ventricular function.   RECOMMENDATIONS:  Continue intensive treatment medically.  Placed on  calcium channel blocker, rule out small-vessel disease.  Treat her with  nonsteroidal anti-inflammatories and the small effusion.   PROGNOSIS:  Good.   DISCHARGE DIET:  Low-salt cardiac diet.   FOLLOWUP:  Primary care physician outpatient.   DISCHARGE MEDICATIONS:  1. Albuterol 2.5 mg inhaled every 6 hours as needed.  2. New medication, Norvasc 5 mg p.o. at night.  3. Norvasc 5 mg p.o. daily.  4. Aspirin 81 mg p.o. daily.  5. Lasix 40 mg p.o. daily.  6. Lopid/gemfibrozil 600 mg p.o. twice daily.  7. Risperdal 1 mg p.o. per previous dose prior to admission.  8. Vitamin B12 per preadmission doses.  9. Fish oil per preadmission dose.  10.Omeprazole 20 mg p.o. daily.  11.Potassium chloride 20 mEq p.o. daily.  12.Amiloride 5 mg p.o. daily.   DISCHARGE CONDITION:  Stable.  The patient advised to call with any  chest pain, shortness of breath, nausea or vomiting.   FOLLOWUP:  Dr. Wynonia Lawman in 2 weeks.  The patient advised to make an  appointment.      Girard Cooter, MD  Electronically Signed     RR/MEDQ  D:  11/30/2008  T:  11/30/2008  Job:  [id number]

## 2011-04-28 NOTE — Assessment & Plan Note (Signed)
OFFICE VISIT   Escobar, Erica L  DOB:  03-16-1936                                       08/01/2008  JFHLK#:[id number]   The patient presents today in 1 week followup of laser ablation of her  right great saphenous vein and sclerotherapy of a prior area of bleeding  on her right pretibial calf.  She has done quite well with the  procedure.  She does have the usual amount of erythema in her thigh  related to the ablation.  The area of her varix that had bled was  decompressed.  She did undergo venous duplex today in our office and  this reveals closure of her saphenous vein throughout the thigh up to  the saphenofemoral junction and she has no evidence of deep venous  thrombosis.  I am quite pleased with her initial result as is the  patient.  She does wish to proceed with left leg laser ablation for  similar difficulty on the left leg but wishes to defer this temporarily  so she can care for her debilitated husband.  I will see her again in 6  weeks for further followup of her ablation of her right leg.  She will  notify us when she wishes to proceed with treatment of her left leg.   Rosetta Posner, M.D.  Electronically Signed   TFE/MEDQ  D:  08/01/2008  T:  08/02/2008  Job:  [id number]   cc:   Lynnell Chad. Shelia Media, M.D.  Dayton Bailiff. [place], M.D.

## 2011-04-28 NOTE — Consult Note (Signed)
NEW PATIENT CONSULTATION   Frutiger, Genifer L  DOB:  02-21-1936                                       04/04/2008  ZYYQM#:[id number]   Nori Winegar presents today for evaluation of venous hypertension worse  on her right leg than her left leg.  She has a past history of a  significant bleed from a right pretibial venous varicosity.  She  controlled this with local pressure.  She does have a history of  varicosities and multiple spider vein telangiectasia bilaterally, more  so on the right than on the left.  She has multiple complaints in her  lower extremities, particularly with heaviness with prolonged standing.  She also has severe night cramps and day cramps and also burning in her  feet and has been diagnosed with restless less syndrome.   PAST HISTORY:  Significant for hypertension, elevated cholesterol.  She  does not have any history of deep vein thrombosis.  She does not have a  premature atherosclerotic history in her family.   SOCIAL HISTORY:  She is married, she is retired, she does not smoke or  drink alcohol.   REVIEW OF SYSTEMS:  Her weight was reported at 190 pounds, she is 5 feet  3 inches tall, she does have shortness of breath of breath with  exertion, asthma and wheezing.  She does have a history of constipation.  She does have headaches and does have difficulty with arthritis and  muscle pain.   MEDICATIONS:  1. Gemfibrozil 600 mg one daily.  2. Amiloride 5 mg daily.  3. Requip 1 mg 30 minutes prior to bedtime.  4. Klor-Con 10 mEq daily.  5. GlycoLax powder daily.  6. One Bayer Aspirin daily.  7. Claritin one daily.  8. Aleve daily.  9. Omeprazole 20 mg daily.  10.Lasix 40 mg daily.   PHYSICAL EXAM:  A well-developed, well-nourished white female appearing  stated age of 81.  Blood pressure is 136/84, pulse 66, respirations 18,  her radial pulses are 2+ bilaterally.  She has 1 to 2+ posterior tibial  pulses bilaterally.  Her legs were  noted for an obvious area of very  superficial varix over her pretibial area on the right at the area where  she has had prior bleeding.  There is still blood in this superficial  varix.  She has multiple spider vein telangiectasia and some reticular  and varicose veins, more so on her right calf.  She underwent handheld  duplex by me and this reveals a gross reflux in her right great  saphenous vein on screening study.  She does have a dilated great  saphenous vein as well.  Her left great saphenous vein does not appear  to be as large and does not have reflux.  I discussed options with Mrs.  Zarr and her husband present.  I explained that the significant bleed  was related to venous hypertension which is correctable with laser  ablation.  Also would require sclerotherapy in the individual varix that  has bled at the time of her procedure.  She has not worn compression  garments and we will attempt this initially to determine if this gives  any improvement in her heavy sensation that she is feeling.  We will  plan to proceed with this and see her in 3 months to determine of  conservative therapy has helped.   Rosetta Posner, M.D.  Electronically Signed   TFE/MEDQ  D:  04/04/2008  T:  04/05/2008  Job:  [id number]   cc:   Dayton Bailiff. [place], M.D.  Lynnell Chad. Shelia Media, M.D.

## 2011-04-28 NOTE — Assessment & Plan Note (Signed)
OFFICE VISIT   Erica Escobar, Erica Escobar  DOB:  [birth year]                                       11/16/2008  VJKQA#:[id number]   Erica Escobar presents today for follow-up after her staged bilateral  great saphenous vein laser ablation and stab phlebectomies in her left  leg.  The right leg was treated in August 2009 and the left leg in  November 2009.  She reports minimal discomfort following her most recent  procedure approximately 3 weeks ago.  Her bruising is all completely  resolved and she has no further evidence of varicosities.  She had  undergone a duplex in our office on November 19 showing a complete  closure of her left great saphenous vein that was treated.  She did have  some distal side branch flow on the right.  I reimaged her with handheld  Doppler today and she does have closure of the saphenous vein  bilaterally.  I am quite pleased with her result as is Ms. Guidotti.  She  will see [place] again on an as-needed basis.   Rosetta Posner, M.D.  Electronically Signed   TFE/MEDQ  D:  11/16/2008  T:  11/19/2008  Job:  [id number]   cc:   Lynnell Chad. Shelia Media, M.D.

## 2011-04-28 NOTE — Cardiovascular Report (Signed)
NAMEMALETA, PACHA               ACCOUNT NO.:  [id number]   MEDICAL RECORD NO.:  [id number]          PATIENT TYPE:  OUT   LOCATION:  CATH                         FACILITY:  [facility]   PHYSICIAN:  Ezzard Standing, M.D.DATE OF BIRTH:  Apr 19, 1936   DATE OF PROCEDURE:  11/29/2008  DATE OF DISCHARGE:                            CARDIAC CATHETERIZATION   HISTORY:  A 75 year old female who has a history of hypertension,  hyperlipidemia, and an abnormal EKG.  She presented to the [facility]  Emergency Room with what was thought to be typical pain suggestive of  angina, relieved with nitroglycerin.  She has a baseline abnormal EKG  with anterolateral T wave inversions noted.  An echocardiogram shows a  very small pericardial effusion without tamponade.  She has continued to  have symptoms in the hospital and has negative enzymes.   PROCEDURE:  Left heart catheterization with coronary angiograms and left  ventriculogram.   COMMENTS ABOUT PROCEDURE:  The patient was brought to the cardiac  catheterization laboratory from [facility] and prepped in the usual  manner.  After Xylocaine anesthesia, a 6-French sheath was placed in the  right femoral percutaneously.  Angiograms were made using 6-French  catheters and a 30-mL ventriculogram was performed.  She tolerated the  procedure well and was returned later to [facility].  She was taken to  the holding area for manual sheath removal.   HEMODYNAMIC DATA:  Aorta postcontrast 130/67.  LV postcontrast 130/114.   ANGIOGRAPHIC DATA:  Left ventriculogram:  Performed in the 30-degree RAO  projection.  The aortic valve is normal.  The mitral valve is normal.  The left ventricle appears normal in size.  Estimated ejection fraction  is 65%.  Coronary arteries:  Arise and distribute normally.  There is no  significant coronary calcification noted.  The left main coronary artery  is normal.  The left anterior descending is a large vessel extends to  the apex and appears normal.  There is no calcification or  irregularities noted.  Circumflex has a large intermediate branch that  is free of disease.  The circumflex is free of disease and appears  normal.  The right coronary artery is a large dominant vessel and  contains no significant disease.   IMPRESSION:  1. No significant coronary artery disease identified.  2. Normal left ventricular function.   RECOMMENDATIONS:  The patient is brought back to [facility].  It is  recommended that she continued to be intensively treated medically.  I  would place her on a calcium channel blocker to rule out small vessel  disease and also treat her with nonsteroidal anti-inflammatories with  small effusion.  Her prognosis should be good from a cardiac viewpoint.     Ezzard Standing, M.D.  Electronically Signed    WST/MEDQ  D:  11/29/2008  T:  11/29/2008  Job:  [id number]

## 2011-04-28 NOTE — Assessment & Plan Note (Signed)
OFFICE VISIT   Erica Escobar, Erica Escobar  DOB:  28-Apr-1936                                       06/29/2008  VKPQA#:[id number]   The patient presents today for continued followup of her lower extremity  venous varicosities and pain.  Her symptoms are worse on her right leg  than on her left leg.  She does have a history of significant bleed from  the right pretibial superficial varix.  This varix is still present and  does have flow in it with no recurrent bleeding since the initial event.  She does continue to have pain and swelling in both lower extremities  despite using thigh high graduated compression garments.  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  She  also reports that bending and squatting are difficult secondary to leg  pain and swelling, and that it is very difficult mowing her yard,  picking vegetables in her garden, and housework.  She also reports pain  and swelling at night making it difficult for her to sleep.  She did  undergo a formal duplex evaluation, which shows reflux throughout her  great saphenous vein bilaterally with flow from the vein directly into a  varix, at the site of her former bleeding spot.  Her deep system shows  no evidence of reflux or DVT.  Her small saphenous veins are without  reflux bilaterally.  We discussed options for this patient.  I feel that  she is an excellent candidate for a laser ablation of her saphenous vein  for reduction of her venous hypertension and improvement of her pain and  swelling.  She also is at a continued risk for rebleeding from the very  superficial varix in her right pretibial area.  I have recommended that  we do sclerotherapy on this at the time of her laser ablation, again to  reduce her risk for rebleeding.  She understands and wishes to schedule  this as soon as possible.   Rosetta Posner, M.D.  Electronically Signed   TFE/MEDQ  D:  06/29/2008  T:  07/02/2008  Job:  [id number]   cc:   Dayton Bailiff. [place], M.D.  Lynnell Chad. Shelia Media, M.D.

## 2011-05-01 NOTE — Op Note (Signed)
NAMEKAELEA, GATHRIGHT               ACCOUNT NO.:  [id number]   MEDICAL RECORD NO.:  [id number]          PATIENT TYPE:  AMB   LOCATION:  [place]                          FACILITY:  [facility]   PHYSICIAN:  Daryll Brod, M.D.       DATE OF BIRTH:  12-May-1936   DATE OF PROCEDURE:  06/24/2005  DATE OF DISCHARGE:                                 OPERATIVE REPORT   PREOPERATIVE DIAGNOSIS:  Actinic keratosis with early squamous cell  carcinoma changes, right thumb.   POSTOPERATIVE DIAGNOSIS:  Actinic keratosis with early squamous cell  carcinoma changes, right thumb.   OPERATION:  Excisional biopsy, full-thickness skin graft antecubital fossa  to nail bed marker distally, right thumb.   SURGEON:  Daryll Brod, M.D.   ASSISTANT:  __________ .   ANESTHESIA:  General.   HISTORY:  The patient is a 75 year old female with a history of squamous  cell carcinoma on her left thumb.  She has developed a lesion on her right  thumb that has been biopsied.  Unfortunately, it has become infected.  This  reveals an actinic keratosis with squamous cell carcinoma changes.  She is  admitted for resection and skin graft.   DESCRIPTION OF PROCEDURE:  The patient was brought to the operating room  where a general anesthetic was carried out without difficulty.  She was  prepped using DuraPrep in the supine position with right arm free.  The limb  was exsanguinated with an Esmarch bandage from the wrist proximally and a  tourniquet placed on the arm was inflated to 150 mmHg.  The lesion was  excised using sharp dissection down to the nail matrix, trying to maintain  this as much as possible.  The extensor tendon was maintained.  The specimen  was marked distally and sent to pathology.  New instruments were then used.  The area was irrigated with saline. A full-thickness skin graft was then  taken from the antecubital fossa after a template was made of the area of  the lesion.  The full-thickness skin graft was defatted,  the area undermined  and closed in the antecubital fossa.  The skin graft was then shaped and  sutured into position, this was a kidney-shaped graft.  Suturing was done  with a running 5-0 chromic suture.  A stent was placed.  A sterile  compressive dressing and splint applied and nonadherent gauze plus  waterproof bandage applied to the antecubital fossa.  The patient tolerated  the procedure well and was taken to the recovery room observation in  satisfactory condition.   She is discharged home to return to the [facility] in a week  to 10 days on Talwin NX and Septra DS.       GK/MEDQ  D:  06/24/2005  T:  06/24/2005  Job:  [id number]   cc:   Dayton Bailiff. [place], M.D.  [address] [place]  [place] [address]  Fax: [phone]

## 2011-05-01 NOTE — Op Note (Signed)
NAMEGREYSON, RICCARDI               ACCOUNT NO.:  [id number]   MEDICAL RECORD NO.:  [id number]          PATIENT TYPE:  AMB   LOCATION:  [place]                          FACILITY:  [facility]   PHYSICIAN:  Daryll Brod, M.D.       DATE OF BIRTH:  01-Sep-1936   DATE OF PROCEDURE:  07/23/2005  DATE OF DISCHARGE:                                 OPERATIVE REPORT   PREOPERATIVE DIAGNOSIS:  Squamous cell carcinoma, right thumb involving the  nail margin.   POSTOPERATIVE DIAGNOSIS:  Squamous cell carcinoma, right thumb involving the  nail margin.   OPERATION:  Excision squamous cell carcinoma, ablation nail, full-thickness  skin graft, right thumb.   SURGEON:  Kuzma.   ASSISTANT:  None.   ANESTHESIA:  General.   HISTORY:  The patient is a 75 year old female who has a squamous cell  carcinoma of her right thumb. She has undergone excision to the level of the  nail bed.  A margin was not obtainable.  She is returned now for repeat  excision with ablation of the nail and full-thickness skin grafting.   OPERATION:  Ablation nail, excision squamous cell carcinoma, full-thickness  skin graft, right thumb from right upper arm.   PROCEDURE:  The patient is brought to the operating room where a general  anesthetic was carried out without difficulty. She was prepped using  DuraPrep, supine position, right arm free.  A stirrup tourniquet was used to  allow proximal placement of the skin graft donor site. A tourniquet placed  on the arm was inflated to 250 mmHg. A large incision was made involving the  entire nail matrix after removal of the nail plate including the old skin  graft with a margin around the entire aspect any potential skip lesion. This  was carried with sharp dissection through the subcutaneous tissue. The  entire specimen was lifted as a single unit.  This was tagged distally and  sent to pathology. The wound was irrigated.  A template was then made using  a portion of an Esmarch bandage.  This was made slightly smaller to allow  better compression. Bleeders were electrocauterized. A full-thickness skin  graft was then harvested from the inner upper arm. This area was then closed  with interrupted 5-0 nylon sutures. The shape of the old thumb nail and tip  was then shaped from the graft. This was placed on the thumb, sutured into  position with interrupted 6-0 chromic in a running 6-0 chromic around the  margin.  A stent dressing was applied fixed with Steri-Strips and Benzoin.  The wounds were then bandaged with a compressive dressing and splint to the  thumb and  Hypafix to the donor site. The patient tolerated the procedure well and was  taken to the recovery room for observation in satisfactory condition. She  will be discharged home to return to the [facility] in one  week on Talwin NX.       GK/MEDQ  D:  07/23/2005  T:  07/23/2005  Job:  [id number]

## 2011-05-01 NOTE — Op Note (Signed)
[facility]  Patient:    Erica Escobar, Erica Escobar                      MRN: [id number] Proc. Date: 12/21/00 Adm. Date:  [id number] Attending:  Hortencia Pilar CC:         Lynnell Chad. Shelia Media, M.D.  John R. Glade Lloyd, M.D.   Operative Report  PROCEDURE:  Pubovaginal sling with Dermatrix.  PREOPERATIVE DIAGNOSIS:  Stress incontinence.  POSTOPERATIVE DIAGNOSIS:  Stress incontinence.  SURGEON:  Dr. Irine Seal.  ANESTHESIA:  General.  DRAINS:  Suprapubic tube and vaginal pack.  COMPLICATIONS:  None.  INDICATIONS FOR PROCEDURE:  Ms. Canova is a 75 year old white female sent by Dr. Shelia Media for evaluation of stress incontinence. She was found to have type 3 stress incontinence with some vaginal mucosal atrophy. She was offered sling and urethral bulking agent. She elected to proceed with the sling.  FINDINGS AND PROCEDURE:  The patient was given p.o. tequin and taken to the operating room where a general anesthetic was induced. She was fitted with TED and PAS hose. She was placed in lithotomy position. Her mons were shaved.  She was prepped with Betadine solution and draped in the usual sterile fashion. A weighted vaginal retractor was placed. A Foley catheter was inserted through the urethra. The anterior vaginal wall was infiltrated with 10 cc of 1% lidocaine with epinephrine. A transverse incision was made at the midline just above the pubis approximately 3 cm in length. The fat was spread down to the fascia and a moist sponge was placed in the wound. The Dermatrix 2 x 8 cm strip was then placed in antibiotic solution. A vertical incision was made in the anterior vaginal well from bladder neck to mid urethral level. The vaginal mucosa was elevated off the pubourethral fascia on each side. The fascia was perforated at its attachment to the pubis with Strulle scissors and finger dissection was used to create a path into the retropubic space. This was  done initially on the right and then the left. The Dermatrix strip was then prepared by placing a #1 Prolene through each end using quadruple helical stitch. The sling was then tacked at the mid urethral level with a 2-0 Vicryl tacking stitch. Two passes of the Raz needle were then made from the abdominal incision into the each of side of the vaginal incision under finger guidance and the suspension sutures were passed in the abdominal incision. At this point, the vaginal incision was closed using a running locked 2-0 Vicryl. The Foley catheter was removed and cystoscopy was performed and this revealed no suture passage through the bladder wall and no injury to the bladder wall and good placement of the sling. The bladder was filled, the patient was placed in the Trendelenburg position and a microvasive fader tip suprapubic tube was placed through a separate stab wound superior to the abdominal incision. The tube was placed under cystoscopic guidance and once in position, the trocar was removed, the core was formed and secured. The bladder was then drained. The patient was taken out of Trendelenburg position. The cystoscope sheath was left in the urethra and held in the neutral position while the suspension sutures were tied under minimal to no tension. Once each was tied, they were tied across the midline to each other and the knot was tucked back in the abdominal incision. The abdominal incision was irrigated with antibiotic solution and closed with running intracuticular 4-0 Vicryl. A  2 incision iodoform vaginal pack was placed. The suprapubic tube was secured to the skin with a silk suture and Steri-Strips and dressing was applied to the wound. The patient was taken down from lithotomy position and her anesthetic was reversed. She was removed to the recovery room in stable condition, there were no complications. DD:  12/21/00 TD:  12/21/00 Job: [id number]

## 2011-05-01 NOTE — Op Note (Signed)
NAMEVALBORG, Erica Escobar               ACCOUNT NO.:  [id number]   MEDICAL RECORD NO.:  [id number]          PATIENT TYPE:  AMB   LOCATION:  [place]                          FACILITY:  [facility]   PHYSICIAN:  Daryll Brod, M.D.       DATE OF BIRTH:  September 23, 1936   DATE OF PROCEDURE:  09/15/2006  DATE OF DISCHARGE:                                 OPERATIVE REPORT   PREOPERATIVE DIAGNOSIS:  Questionable recurrent squamous cell carcinoma,  left thumb.   POSTOPERATIVE DIAGNOSIS:  Questionable recurrent squamous cell carcinoma,  left thumb.   OPERATION:  Excision, lesion, left thumb with full-thickness skin graft.   SURGEON:  Daryll Brod, M.D.   ASSISTANT:  Dimas Millin R.N.   ANESTHESIA:  General.   HISTORY:  The patient is a 75 year old female with a history of squamous  cell carcinoma of the nail folds of each thumb.  She has undergone ablation  of her right.  She is had skin grafting to the left, but there appears to be  a lesion forming, with the possibility of recurrence.   PROCEDURE:  The patient is brought to the operating room, where a general  anesthetic was carried out without difficulty after marking the area in the  preoperative area by both the patient and surgeon, answering questions.  A  general anesthetic was carried out without difficulty.  She was prepped  using DuraPrep, supine position, left arm free.  The limb was exsanguinated  from the wrist proximally.  A tourniquet placed high on the arm was  inflated, to 150 mmHg.  The nail plate was removed to be certain that the  lesion did not progress under the nail plate.  No changes were noted.  An  excision was then performed full-thickness to remove the entire area of  question.  This was tagged distally and sent to pathology.  This maintained  an area that was going to require a graft.  Template was made.  The  instruments were changed, as were gloves.  A full-thickness skin graft was  then taken from the antecubital fossa.  This was  undermined and closed with  interrupted 5-0 nylon sutures.  The graft was placed and sutured into  position with a running 6-0 chromic suture.  A stent dressing was applied.  The proximal aspect of the nail plate was reapproximated to the proximal  nail fold prior to the stent dressing.  A sterile compressive dressing was  applied to the thumb and to the antecubital fossa graft site.  The patient  tolerated the procedure well and was taken to the recovery room for  observation in satisfactory condition.  She will be discharged home to  return to the [facility] in 1 week on Talwin NX.           ______________________________  Daryll Brod, M.D.    GK/MEDQ  D:  09/15/2006  T:  09/16/2006  Job:  [id number]   cc:   Dayton Bailiff. [place], M.D.

## 2011-05-01 NOTE — Op Note (Signed)
NAMEELEINA, JERGENS                         ACCOUNT NO.:  [id number]   MEDICAL RECORD NO.:  [id number]                   PATIENT TYPE:  AMB   LOCATION:  [place]                                  FACILITY:  [facility]   PHYSICIAN:  Daryll Brod, M.D.                    DATE OF BIRTH:  [birth year]   DATE OF PROCEDURE:  07/16/2004  DATE OF DISCHARGE:                                 OPERATIVE REPORT   PREOPERATIVE DIAGNOSIS:  Possible recurrent squamous cell carcinoma, left  thumb.   POSTOPERATIVE DIAGNOSIS:  Possible recurrent squamous cell carcinoma, left  thumb.   OPERATION PERFORMED:  Excisional biopsy of multiple lesions, left thumb.   SURGEON:  Daryll Brod, M.D.   ASSISTANTDimas Millin.   ANESTHESIA:  General.   INDICATIONS FOR PROCEDURE:  The patient is a 75 year old female referred  from dermatology having had a treatment for a squamous cell carcinoma of her  nail cuticle.  She has had a recurrence of a lesion in the same area.  This  is crusted and hypertrophic.  She is desirous of excisional biopsy with  reconstruction of the nail attempting to maintain the nail plate and nail  bed.   DESCRIPTION OF PROCEDURE:  The patient was brought to the operating room a  general anesthetic was carried out without difficulty.  She was prepped  using DuraPrep in supine position, left arm free.  The limb was  exsanguinated from the wrist proximally with an Esmarch bandage.  Tourniquet  placed high on the arm was inflated to 250 mmHg.  An elliptical incision was  made covering the entire cuticle.  This was taken down through subcutaneous  tissue.  The entire area was sent to pathology.  A second lesion was  identified more proximally.  This was also excised elliptically.  This was  tagged with a suture in the distal margin and sent to pathology.  There was  a thickened area of the nail matrix.  This was also excised and sent to  pathology.  The wounds were irrigated, a template was then made.  This  was  then used for harvesting of a full thickness skin graft in the antecubital  fossa.  The wounds on the nail bed and skin proximally were closed with  interrupted 6-0 chromic sutures.  The skin graft was harvested.  This was  then closed with interrupted 5-0 nylon sutures.  The harvesting was done  with a new blade.  The skin graft was then placed onto the nail cuticle and  sutured into position with interrupted and running 6-0 chromic sutures.  Sterile compressive dressing and splint was applied.  The patient tolerated  the procedure well and was taken to the recovery room for observation in  satisfactory condition.  She is discharged to home to return to the [facility] in one week on Talwin NX.  Specimens  were sent  separately to pathology.                                               Daryll Brod, M.D.    Aretta Nip  D:  07/16/2004  T:  07/16/2004  Job:  [id number]

## 2011-05-01 NOTE — Op Note (Signed)
Erica Escobar, Erica Escobar                         ACCOUNT NO.:  [id number]   MEDICAL RECORD NO.:  [id number]                   PATIENT TYPE:  AMB   LOCATION:  [place]                                  FACILITY:  [facility]   PHYSICIAN:  Weber Cooks, M.D.                  DATE OF BIRTH:  11-10-1936   DATE OF PROCEDURE:  03/27/2003  DATE OF DISCHARGE:                                 OPERATIVE REPORT   PREOPERATIVE DIAGNOSES:  1. Right second metatarsalgia.  2. Right second through fifth acquired hammer toes.  3. Right side Achilles tendon.   POSTOPERATIVE DIAGNOSES:  1. Right second metatarsalgia.  2. Right second through fifth acquired hammer toes.  3. Right side Achilles tendon.   OPERATION:  1. Right second Weil metatarsal shortening osteotomy.  2. Right second and third extensor digitorum longus tenotomies.  3. Right fifth extensor digitorum longus lengthening.  4. Right second toe extensor digitorum brevis to extensor digitorum longus     transfer.  5. Right percutaneous tendon Achilles lengthening.  6. Right second metatarsal phalangeal joint dorsal capsulotomy with     collateral release.   SURGEON:  Weber Cooks, M.D.   ASSISTANT:  Gerri Lins, P.A.   ANESTHESIA:  General endotracheal tube.   ESTIMATED BLOOD LOSS:  Minimal.   TOURNIQUET TIME:  Approximately one hour.   COMPLICATIONS:  None   DISPOSITION:  Stable to the PR.   INDICATIONS FOR PROCEDURE:  This is a 75 year old female who has had long  standing forefoot pain that was resistant to conservative management.  She  was consented for the above procedure.  All risks, which include infection,  nerve or vessel injury, nonunion, malunion, rotation, failure, persistent  pain, worsening of pain, stiffness, arthritis, recurrence of deformity,  ischemia of the toe were all explained.  Questions were encouraged and  answered.   DESCRIPTION OF PROCEDURE:  The patient was brought to the operating room and  placed in  the supine position.  After adequate general endotracheal tube  anesthesia was administered, as well as Ancef 1 gm IV piggyback, the right  lower extremity was then prepped and draped in a sterile manner over a  proximal placed thigh tourniquet with the bump placed under ipsilateral hip.  The procedure commenced with a dorsal longitudinal incision over the second  MTP joint.  Extensor tendons were identified.  The extensor digitorum longus  was tenotomized proximal medial and the brevis distal lateral.  The MTP  joint dorsal capsulotomy was then done.  Once the dorsal capsulotomy with  collateral release was done with a #15 blade scalpel, the toe was plantar  flexed and the metatarsal head was visualized.  With a stacked sagittal saw  blade an osteotomy was made parallel to the plantar aspect of the foot as  described by Malka So.  Once this was done, the head was then proximally  transferred approximately 2 to  3 mm and then with a [place] right medical  12 mm Weil osteotomy screw then was then fixed.  This was then verified with  serial x-ray in AP and lateral planes to be in proper position.  This had  excellent compression of the osteotomy as well.  The end of the metatarsal  was then trimmed with a rongeur.  Toe was ranged and had excellent range of  motion.  The extensor digitorum brevis was then transferred to the extensor  digitorum longus using 3-0 PDS suture.  Once this was done, we then made a  longitudinal incision over the dorsal aspect over the fourth extensor  digitorum longus tendon.  Dissection was carried down through skin.  Extensor digitorum longus tendon was then identified to the third and fourth  toe and was tenotomized.  This was verified by pulling on each tendon and  extending the toe.  We then identified the fifth extensor digitorum longus  and then did a Z-lengthening of this tendon.  We also performed the  percutaneous tendon Achilles lengthening with two medial and  lateral  hemisections which extended this.  It had excellent release of the tight  Achilles tendon.  All wound were copiously irrigated with normal saline.  Hemostasis was obtained.  Wounds were closed with 4-0 Nylon.  Sterile  dressings were applied.  Cam walker boot was applied.  The patient was  stable to the PR.                                               Weber Cooks, M.D.    PB/MEDQ  D:  03/27/2003  T:  03/27/2003  Job:  [id number]

## 2011-05-01 NOTE — Procedures (Signed)
[facility]  Patient:    ANWYN, KRIEGEL Visit Number: [id number] MRN: [id number]          Service Type: END Location: ENDO Attending Physician:  Jim Desanctis Dictated by:   Jim Desanctis, M.D. Proc. Date: 04/18/02 Admit Date:  04/18/2002                             Procedure Report  PROCEDURE:  Colonoscopy.  ENDOSCOPIST:  Jim Desanctis, M.D.  INDICATIONS:  Rectal bleeding.  ANESTHESIA:  Demerol 30 mg, Versed 3 mg.  DESCRIPTION OF PROCEDURE:  With the patient mildly sedated in the left lateral decubitus position, the Olympus videoscopic was inserted colonoscope was inserted into the rectum and passed under direct vision to the cecum, identified by the ileocecal valve and appendiceal orifice.  Pressure was applied to the abdomen to advance to this area.  Photographs were taken from this point.  The endoscope was slowly withdrawn taking circumferential views of the entire colonic mucosa stopping only in the rectum which appeared normal.  Indirect view showed hemorrhoids and on retroflexed view.  The endoscope was straightened and withdrawn.  The patients vital signs and pulse oximetry remained stable.  The patient tolerated the procedure well without apparent complications.  FINDINGS: 1. Diverticulosis of the sigmoid colon. 2. Internal hemorrhoids. 3. Otherwise unremarkable examination.  PLAN:  Repeat examination possibly in 5-10 years. Dictated by:   Jim Desanctis, M.D. Attending Physician:  Jim Desanctis DD:  04/18/02 TD:  04/19/02 Job: [id number]

## 2011-05-01 NOTE — Op Note (Signed)
Erica Escobar, Erica Escobar                         ACCOUNT NO.:  [id number]   MEDICAL RECORD NO.:  [id number]                   PATIENT TYPE:  AMB   LOCATION:  [place]                                 [facility]ACILITY[facility]   PHYSICIAN:  Waverly Ferrari, M.D.                 DATE OF BIRTH:  1936/09/07   DATE OF PROCEDURE:  DATE OF DISCHARGE:                                 OPERATIVE REPORT   PROCEDURE:  Flexible sigmoidoscopy.   INDICATIONS FOR PROCEDURE:  Rectal bleeding consistent with hemorrhoidal  type bleeding.   ANESTHESIA:  None given.   DESCRIPTION OF PROCEDURE:  With the patient in the left lateral decubitus  position, perineal inspection revealed external hemorrhoids.  Subsequently a  rectal examination was performed which  felt internal hemorrhoids as well.  Subsequently the Olympus videoscopic colonoscope was inserted in the rectum,  passed to the distal sigmoid colon. The patient became uncomfortable and we  pulled back from there taking circumferential views of the colonic mucosa  visualized from approximately 50 cm distally until we reached back to the  rectum which appeared normal on direct view and showed large internal  hemorrhoids on retroflexed view. The endoscope was straightened and pulled  through the anal canal and no fissure was seen. The scope was withdrawn.  The patient's vital signs and pulse oximeter remained stable. The patient  tolerated the procedure well without apparent complications.   FINDINGS:  Flexible sigmoidoscopy revealing internal and external  hemorrhoids otherwise an unremarkable examination.   PLAN:  The patient has some degree of constipation, will add Citrucel to her  diet prior to supper and have her followup with me as needed.                                               Waverly Ferrari, M.D.    GMO/MEDQ  D:  11/28/2003  T:  11/28/2003  Job:  [id number]

## 2011-05-01 NOTE — Procedures (Signed)
. [facility]  Patient:    Erica Escobar, Erica Escobar Visit Number: [id number] MRN: [id number]          Service Type: END Location: ENDO Attending Physician:  Jim Desanctis Dictated by:   Jim Desanctis, M.D. Admit Date:  04/18/2002                             Procedure Report  PROCEDURE:  Upper endoscopy.  INDICATION:  GERD, Hemoccult-positive stools.  ANESTHESIA:  Demerol 70 mg, Versed 7 mg.  DESCRIPTION OF PROCEDURE:  With the patient mildly sedated in the left lateral decubitus position, the Olympus videoscopic endoscope was inserted in the mouth, passed under direct vision through the esophagus, which appeared normal, into the stomach.  The fundus, body, antrum, duodenal bulb, second portion of duodenum were all visualized, appeared normal.  From this point the endoscope was slowly withdrawn, taking circumferential views of the duodenal mucosa until the endoscope had been pulled back into the stomach, placed in retroflexion to view the stomach from below.  The endoscope was then straightened and withdrawn, taking circumferential views of the remaining gastric and esophageal mucosa.  The patients vital signs and pulse oximetry remained stable.  The patient tolerated the procedure well without apparent complications.  FINDINGS:  Negative endoscopic examination.  PLAN:  Continue present therapy and proceed to colonoscopy as planned. Dictated by:   Jim Desanctis, M.D. Attending Physician:  Jim Desanctis DD:  04/18/02 TD:  04/19/02 Job: [id number]

## 2011-05-01 NOTE — Procedures (Signed)
DUPLEX DEEP VENOUS EXAM - LOWER EXTREMITY   INDICATION:  Follow-up evaluation, status post greater saphenous vein  ablation.   HISTORY:  Edema:  Left leg edema.  Patient is using a stocking.  Trauma/Surgery:  Left greater saphenous vein ablation 1 week ago.  Right  leg greater saphenous vein ablation, 07/2008.  Pain:  Left leg.  PE:  No.  Previous DVT:  No.  Anticoagulants:  No.  Other:   DUPLEX EXAM:                CFV   SFV   PopV  PTV    GSV                R  L  R  L  R  L  R   L  R  L  Thrombosis    o  o     o     o      o  +  +  Spontaneous   +  +     +     +      +  +  o  Phasic        +  +     +     +      +  +  o  Augmentation  +  +     +     +      +  o  o  Compressible  +  +     +     +      +  o  o  Competent     +  +     +     +      +  o  o   Legend:  + - yes  o - no  p - partial  D - decreased   IMPRESSION:  1. 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  No thrombus is seen      in the circumflex vein or common femoral vein.  No left leg deep      venous thrombosis.  2. On the right, the anterior branch of the greater saphenous vein is      patent and incompetent to the mid thigh.  The mid-to-distal thigh      portion of the anterior branch is occluded.  The right medial      branch of the greater saphenous vein is competent.    _____________________________  Erica Escobar, M.D.   MC/MEDQ  D:  10/31/2008  T:  10/31/2008  Job:  [id number]

## 2011-05-01 NOTE — H&P (Signed)
Erica Escobar, Erica Escobar               ACCOUNT NO.:  [id number]   MEDICAL RECORD NO.:  [id number]          PATIENT TYPE:  AMB   LOCATION:                               FACILITY:  [facility]   PHYSICIAN:  Daryll Brod, M.D.       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  She  has undergone excision.  Margins, however, were not clear, the margin being  over to the nail matrix.  She is admitted now for re-removal with ablation  of her nail and full thickness skin grafting.  She is biopsy-proven and  surgery-proven squamous cell carcinoma.   PAST MEDICAL HISTORY:  She is allergic to PENICILLIN and CODEINE.  She is on  ___________, ___________, hydrochloride, Requip, Ceclor, K-Lor, Glycolax,  aspirin, Aleve, fish oil.   PAST SURGERIES:  1.  Hysterectomy.  2.  Hemorrhoidectomy.  3.  Knee surgery.  4.  Surgery on her right foot, left thumb.  5.  T&A as a child.  6.  Carpal tunnel release.   SOCIAL HISTORY:  She is retired.  She does not smoke.  Does not drink.   FAMILY HISTORY:  Noncontributory.   REVIEW OF SYSTEMS:  Positive for arthritis, joint pain, impaired vision, and  squamous cell carcinoma, otherwise negative.   PHYSICAL EXAMINATION:  GENERAL:  Well-developed, well-nourished female in no  acute distress.  Alert and oriented.  HEENT:  No bony changes.  Pupils are equal, round, and reactive to light and  accommodation.  She is normocephalic.  NECK:  Supple without mass or megaly.  CHEST:  Clear to auscultation.  BREASTS:  Not done.  HEART:  Regular without murmurs, rubs, or gallops.  ABDOMEN:  Soft, nontender without mass or megaly.  GENITOURINARY:  Not done.  EXTREMITIES:  Healing graft on her right thumb, healed graft on her left  thumb.  Skin is  otherwise normal.  She has full mobility, normal sensation.   DIAGNOSES:  Squamous cell carcinoma right thumb.   PLAN:  Re-resection with ablation of her nail matrix left thumb as an  outpatient.           ______________________________  Daryll Brod, M.D.     GK/MEDQ  D:  08/31/2005  T:  09/01/2005  Job:  [id number]

## 2011-09-17 LAB — CARDIAC PANEL(CRET KIN+CKTOT+MB+TROPI)
CK, MB: 0.7 ng/mL (ref 0.3–4.0)
CK, MB: 0.7 ng/mL (ref 0.3–4.0)
CK, MB: 0.8 ng/mL (ref 0.3–4.0)
Relative Index: INVALID (ref 0.0–2.5)
Relative Index: INVALID (ref 0.0–2.5)
Relative Index: INVALID (ref 0.0–2.5)
Total CK: 23 U/L (ref 7–177)
Total CK: 29 U/L (ref 7–177)
Total CK: 34 U/L (ref 7–177)
Troponin I: <0.01 ng/mL (ref 0.00–0.06)
Troponin I: <0.01 ng/mL (ref 0.00–0.06)
Troponin I: <0.01 ng/mL (ref 0.00–0.06)

## 2011-09-17 LAB — URINALYSIS, MICROSCOPIC ONLY
Bilirubin Urine: NEGATIVE
Nitrite: NEGATIVE
Specific Gravity, Urine: 1.011 (ref 1.005–1.030)
pH: 6.5 (ref 5.0–8.0)

## 2011-09-17 LAB — CBC
HCT: 37.8 % (ref 36.0–46.0)
Hemoglobin: 12.5 g/dL (ref 12.0–15.0)
Hemoglobin: 12.8 g/dL (ref 12.0–15.0)
RDW: 12.7 % (ref 11.5–15.5)
RDW: 13.1 % (ref 11.5–15.5)

## 2011-09-17 LAB — TSH: TSH: 1.24 u[IU]/mL (ref 0.350–4.500)

## 2011-09-17 LAB — BASIC METABOLIC PANEL
CO2: 29 mEq/L (ref 19–32)
Calcium: 9 mg/dL (ref 8.4–10.5)
Calcium: 9.1 mg/dL (ref 8.4–10.5)
Chloride: 102 mEq/L (ref 96–112)
Creatinine, Ser: 1.23 mg/dL — ABNORMAL HIGH (ref 0.4–1.2)
GFR calc Af Amer: 52 mL/min — ABNORMAL LOW (ref >60)
GFR calc Af Amer: 58 mL/min — ABNORMAL LOW (ref >60)
GFR calc non Af Amer: 48 mL/min — ABNORMAL LOW (ref >60)
GFR calc non Af Amer: 50 mL/min — ABNORMAL LOW (ref >60)
Glucose, Bld: 101 mg/dL — ABNORMAL HIGH (ref 70–99)
Glucose, Bld: 110 mg/dL — ABNORMAL HIGH (ref 70–99)
Potassium: 3.9 mEq/L (ref 3.5–5.1)
Sodium: 138 mEq/L (ref 135–145)
Sodium: 139 mEq/L (ref 135–145)

## 2011-09-17 LAB — POCT CARDIAC MARKERS: Troponin i, poc: <0.05 ng/mL (ref 0.00–0.09)

## 2011-09-17 LAB — APTT
aPTT: 30 seconds (ref 24–37)
aPTT: 34 seconds (ref 24–37)

## 2011-09-17 LAB — DIFFERENTIAL
Basophils Absolute: 0.1 10*3/uL (ref 0.0–0.1)
Eosinophils Relative: 1 % (ref 0–5)
Lymphocytes Relative: 18 % (ref 12–46)
Monocytes Absolute: 0.8 10*3/uL (ref 0.1–1.0)

## 2011-09-17 LAB — B-NATRIURETIC PEPTIDE (CONVERTED LAB): Pro B Natriuretic peptide (BNP): <30 pg/mL (ref 0.0–100.0)

## 2011-09-17 LAB — PHOSPHORUS: Phosphorus: 3.2 mg/dL (ref 2.3–4.6)

## 2011-09-17 LAB — COMPREHENSIVE METABOLIC PANEL
BUN: 20 mg/dL (ref 6–23)
CO2: 27 mEq/L (ref 19–32)
Calcium: 8.7 mg/dL (ref 8.4–10.5)
Chloride: 107 mEq/L (ref 96–112)
Creatinine, Ser: 1.02 mg/dL (ref 0.4–1.2)
GFR calc non Af Amer: 54 mL/min — ABNORMAL LOW (ref >60)
Total Bilirubin: 0.6 mg/dL (ref 0.3–1.2)

## 2011-09-17 LAB — LIPID PANEL
LDL Cholesterol: 91 mg/dL (ref 0–99)
VLDL: 21 mg/dL (ref 0–40)

## 2011-09-17 LAB — PROTIME-INR: Prothrombin Time: 14 seconds (ref 11.6–15.2)

## 2011-09-17 LAB — D-DIMER, QUANTITATIVE: D-Dimer, Quant: <0.22 ug/mL-FEU (ref 0.00–0.48)

## 2011-10-06 ENCOUNTER — Other Ambulatory Visit: Payer: Self-pay | Admitting: Dermatology

## 2012-04-05 ENCOUNTER — Other Ambulatory Visit: Payer: Self-pay | Admitting: Dermatology

## 2013-03-20 ENCOUNTER — Telehealth: Payer: Self-pay | Admitting: *Deleted

## 2013-03-20 MED ORDER — OXCARBAZEPINE 150 MG PO TABS: 150.0000 mg | ORAL_TABLET | Freq: Two times a day (BID) | ORAL | Status: DC

## 2013-03-20 NOTE — Telephone Encounter (Signed)
I called patient. The patient could not tolerate the gabapentin secondary to throat swelling and leg swelling. She was taken off of this medication. The patient will be placed on Trileptal for the occipital neuralgia.

## 2013-03-20 NOTE — Telephone Encounter (Signed)
Patient called stating Dr. Shelia Media has taken her off Gabapentin and she would like to speak with physician

## 2013-07-14 ENCOUNTER — Ambulatory Visit: Payer: Self-pay | Admitting: Neurology

## 2013-09-29 ENCOUNTER — Other Ambulatory Visit: Payer: Self-pay | Admitting: Dermatology

## 2013-10-16 ENCOUNTER — Other Ambulatory Visit: Payer: Self-pay | Admitting: Dermatology

## 2013-12-29 ENCOUNTER — Other Ambulatory Visit: Payer: Self-pay | Admitting: Dermatology

## 2014-03-30 ENCOUNTER — Other Ambulatory Visit: Payer: Self-pay | Admitting: Dermatology

## 2014-06-29 ENCOUNTER — Other Ambulatory Visit: Payer: Self-pay | Admitting: Dermatology

## 2014-12-13 ENCOUNTER — Other Ambulatory Visit: Payer: Self-pay | Admitting: Cardiology

## 2014-12-13 ENCOUNTER — Ambulatory Visit
Admission: RE | Admit: 2014-12-13 | Discharge: 2014-12-13 | Disposition: A | Discharge status: Home / Self Care | Payer: Medicare Other | Source: Ambulatory Visit | Attending: Cardiology | Admitting: Cardiology

## 2014-12-13 DIAGNOSIS — R0602 Shortness of breath: Secondary | ICD-10-CM

## 2014-12-18 DIAGNOSIS — D649 Anemia, unspecified: Secondary | ICD-10-CM | POA: Diagnosis not present

## 2014-12-20 DIAGNOSIS — R0602 Shortness of breath: Secondary | ICD-10-CM | POA: Diagnosis not present

## 2014-12-20 DIAGNOSIS — R0789 Other chest pain: Secondary | ICD-10-CM | POA: Diagnosis not present

## 2014-12-20 DIAGNOSIS — R9431 Abnormal electrocardiogram [ECG] [EKG]: Secondary | ICD-10-CM | POA: Diagnosis not present

## 2014-12-24 DIAGNOSIS — R195 Other fecal abnormalities: Secondary | ICD-10-CM | POA: Diagnosis not present

## 2014-12-24 DIAGNOSIS — Z1212 Encounter for screening for malignant neoplasm of rectum: Secondary | ICD-10-CM | POA: Diagnosis not present

## 2014-12-24 DIAGNOSIS — R1011 Right upper quadrant pain: Secondary | ICD-10-CM | POA: Diagnosis not present

## 2014-12-24 DIAGNOSIS — D508 Other iron deficiency anemias: Secondary | ICD-10-CM | POA: Diagnosis not present

## 2014-12-26 DIAGNOSIS — R195 Other fecal abnormalities: Secondary | ICD-10-CM | POA: Diagnosis not present

## 2014-12-26 DIAGNOSIS — D500 Iron deficiency anemia secondary to blood loss (chronic): Secondary | ICD-10-CM | POA: Diagnosis not present

## 2014-12-26 DIAGNOSIS — R112 Nausea with vomiting, unspecified: Secondary | ICD-10-CM | POA: Diagnosis not present

## 2015-01-01 ENCOUNTER — Other Ambulatory Visit: Payer: Self-pay | Admitting: Gastroenterology

## 2015-01-01 ENCOUNTER — Ambulatory Visit (HOSPITAL_COMMUNITY)
Admission: RE | Admit: 2015-01-01 | Discharge: 2015-01-01 | Disposition: A | Discharge status: Home / Self Care | Payer: Medicare Other | Source: Ambulatory Visit | Attending: Gastroenterology | Admitting: Gastroenterology

## 2015-01-01 DIAGNOSIS — K5730 Diverticulosis of large intestine without perforation or abscess without bleeding: Secondary | ICD-10-CM | POA: Diagnosis not present

## 2015-01-01 DIAGNOSIS — C182 Malignant neoplasm of ascending colon: Secondary | ICD-10-CM | POA: Diagnosis not present

## 2015-01-01 DIAGNOSIS — I700 Atherosclerosis of aorta: Secondary | ICD-10-CM | POA: Diagnosis not present

## 2015-01-01 DIAGNOSIS — D500 Iron deficiency anemia secondary to blood loss (chronic): Secondary | ICD-10-CM | POA: Diagnosis not present

## 2015-01-01 DIAGNOSIS — C183 Malignant neoplasm of hepatic flexure: Secondary | ICD-10-CM | POA: Diagnosis not present

## 2015-01-01 DIAGNOSIS — D122 Benign neoplasm of ascending colon: Secondary | ICD-10-CM | POA: Diagnosis not present

## 2015-01-01 DIAGNOSIS — Z85038 Personal history of other malignant neoplasm of large intestine: Secondary | ICD-10-CM | POA: Diagnosis not present

## 2015-01-01 DIAGNOSIS — K635 Polyp of colon: Secondary | ICD-10-CM | POA: Diagnosis not present

## 2015-01-01 DIAGNOSIS — K6389 Other specified diseases of intestine: Secondary | ICD-10-CM | POA: Diagnosis not present

## 2015-01-01 DIAGNOSIS — D509 Iron deficiency anemia, unspecified: Secondary | ICD-10-CM | POA: Diagnosis not present

## 2015-01-01 MED ORDER — IOHEXOL 300 MG/ML  SOLN
100.0000 mL | Freq: Once | INTRAMUSCULAR | Status: AC | PRN
  Administered 2015-01-01: 100 mL via INTRAVENOUS

## 2015-01-07 ENCOUNTER — Other Ambulatory Visit (INDEPENDENT_AMBULATORY_CARE_PROVIDER_SITE_OTHER): Payer: Self-pay | Admitting: Surgery

## 2015-01-07 DIAGNOSIS — C189 Malignant neoplasm of colon, unspecified: Secondary | ICD-10-CM

## 2015-01-10 ENCOUNTER — Ambulatory Visit
Admission: RE | Admit: 2015-01-10 | Discharge: 2015-01-10 | Disposition: A | Discharge status: Home / Self Care | Payer: Medicare Other | Source: Ambulatory Visit | Attending: Surgery | Admitting: Surgery

## 2015-01-10 ENCOUNTER — Other Ambulatory Visit: Payer: Self-pay | Admitting: Surgery

## 2015-01-10 DIAGNOSIS — Z01818 Encounter for other preprocedural examination: Secondary | ICD-10-CM | POA: Diagnosis not present

## 2015-01-10 DIAGNOSIS — R0602 Shortness of breath: Secondary | ICD-10-CM | POA: Diagnosis not present

## 2015-01-10 DIAGNOSIS — C189 Malignant neoplasm of colon, unspecified: Secondary | ICD-10-CM | POA: Diagnosis not present

## 2015-01-10 DIAGNOSIS — K449 Diaphragmatic hernia without obstruction or gangrene: Secondary | ICD-10-CM | POA: Diagnosis not present

## 2015-01-10 MED ORDER — IOHEXOL 300 MG/ML  SOLN
75.0000 mL | Freq: Once | INTRAMUSCULAR | Status: AC | PRN
  Administered 2015-01-10: 75 mL via INTRAVENOUS

## 2015-01-11 DIAGNOSIS — E611 Iron deficiency: Secondary | ICD-10-CM | POA: Diagnosis not present

## 2015-01-11 LAB — CEA: CEA: 8.9 ng/mL — ABNORMAL HIGH (ref 0.0–5.0)

## 2015-01-16 ENCOUNTER — Other Ambulatory Visit (INDEPENDENT_AMBULATORY_CARE_PROVIDER_SITE_OTHER): Payer: Self-pay | Admitting: Surgery

## 2015-01-16 DIAGNOSIS — C183 Malignant neoplasm of hepatic flexure: Secondary | ICD-10-CM | POA: Diagnosis not present

## 2015-01-16 NOTE — H&P (Signed)
Erica Escobar 01/16/2015 8:45 AM Location: [facility] Patient #: [id number] DOB: 1936-08-06 Married / Language: Erica Escobar / Race: White Female History of Present Illness Erica Hector MD; 01/16/2015 10:22 AM) Patient words: eval colon mass.  The patient is a 79 year old female who presents with colorectal cancer. Patient sent by her gastroenterologist Dr. Carol Escobar for mass and ascending colon causing anemia. Concerning for cancer. Pleasant elderly but active woman. Has been struggling with anemia and weakness. History of normal colonoscopies in the past. Underwent gastroenterology evaluation. Upper endoscopy normal. Lower endoscopy showed mass in the hepatic flexure. Biopsy showing poorly differentiated adenocarcinoma and ascending colon polyp as well as a mass in the hepatic flexure showing cancer. Most likely same area. Surgical consultation requested. Patient normally has a bowel movement every day. No history of bed constipation and diarrhea. She can walk 20 minutes without difficulty. No history of cardiac issues or strokes. Had an abdominal hysterectomy many decades ago. Tolerated laparoscopic cholecystectomy 4 years ago. She lives at home with her husband. He is wheelchair-bound. She takes care of him. Has some friends in town but no major family members. She is taking iron. That seems to have helped things. Surgical consultation recommended. No history Crohn's or colitis. Not limited bowel disease. No history of ulcers or heartburn issues. Takes 81 mg aspirin for general prevention. Not on any aggressive blood thinners. Other Problems Erica Buffy, RN; 01/16/2015 8:46 AM) Arthritis Back Pain Bladder Problems Chest pain Cholelithiasis Depression Gastroesophageal Reflux Disease General anesthesia - complications Hemorrhoids High blood pressure Hypercholesterolemia Oophorectomy  Past Surgical History Erica Buffy, RN; 01/16/2015  8:52 AM) Cataract Surgery Bilateral. Colon Polyp Removal - Open Foot Surgery Right. Gallbladder Surgery - Open Hysterectomy (not due to cancer) - Complete Knee Surgery Right. Tonsillectomy Catheterization, Right & Left Heart Hemorrhoidectomy Gallbladder Surgery bladder tack  Diagnostic Studies History Erica Buffy, RN; 01/16/2015 8:46 AM) Colonoscopy within last year Mammogram within last year Pap Smear >5 years ago  Allergies Erica Buffy, RN; 01/16/2015 8:46 AM) Gabapentin *ANTICONVULSANTS* Swelling.  Medication History Erica Buffy, RN; 01/16/2015 8:50 AM) Potassium Chloride ER (10MEQ Capsule ER, Oral daily) Active. AmLODIPine Besylate (5MG Tablet, Oral daily) Active. Gemfibrozil (600MG Tablet, Oral daily) Active. Lasix (40MG Tablet, Oral daily) Active. Aspirin Low Strength (81MG Tablet Chewable, Oral daily) Active. Loratadine Allergy Relief (10MG Tablet Disperse, Oral as needed) Active. Citracal + D (250-200MG-UNIT Tablet, Oral daily) Active. Aleve (220MG Capsule, Oral as needed) Active. Ocuvite Adult 50+ (Oral daily) Active. Multivitamin Adults 50+ (Oral daily) Active. MiraLax (Oral as needed) Active. Fish Oil Burp-Less (1000MG Capsule, Oral daily) Active. Omeprazole (20MG Capsule DR, Oral daily) Active. Nortriptyline HCl (10MG Capsule, Oral daily) Active. B12-Active (1MG Tablet Chewable, Oral daily) Active. ROPINIRole HCl (0.5MG Tablet, Oral daily) Active. Pantoprazole Sodium (40MG Tablet DR, Oral daily) Active.  Social History Erica Buffy, RN; 01/16/2015 8:46 AM) Caffeine use Carbonated beverages, Tea. No alcohol use No drug use  Family History Erica Buffy, RN; 01/16/2015 8:46 AM) Arthritis Mother. Cancer Brother, Father. Depression Mother. Diabetes Mellitus Mother. Heart Disease Father. Hypertension Father, Mother.  Pregnancy / Birth History Erica Buffy, RN; 01/16/2015 8:46 AM) Age at menarche 9  years. Gravida 2 Maternal age 37-25 Para 2     Review of Systems Erica Buffy RN; 01/16/2015 8:46 AM) General Present- Fatigue and Weight Loss. Not Present- Appetite Loss, Chills, Fever, Night Sweats and Weight Gain. Skin Present- Dryness. Not Present- Change in Wart/Mole, Hives, Jaundice, New Lesions, Non-Healing Wounds, Rash and Ulcer. HEENT  Present- Seasonal Allergies and Wears glasses/contact lenses. Not Present- Earache, Hearing Loss, Hoarseness, Nose Bleed, Oral Ulcers, Ringing in the Ears, Sinus Pain, Sore Throat, Visual Disturbances and Yellow Eyes. Respiratory Present- Snoring. Not Present- Bloody sputum, Chronic Cough, Difficulty Breathing and Wheezing. Breast Not Present- Breast Mass, Breast Pain, Nipple Discharge and Skin Changes. Cardiovascular Present- Leg Cramps. Not Present- Chest Pain, Difficulty Breathing Lying Down, Palpitations, Rapid Heart Rate, Shortness of Breath and Swelling of Extremities. Gastrointestinal Present- Abdominal Pain, Bloating, Constipation and Hemorrhoids. Not Present- Bloody Stool, Change in Bowel Habits, Chronic diarrhea, Difficulty Swallowing, Excessive gas, Gets full quickly at meals, Indigestion, Nausea, Rectal Pain and Vomiting. Female Genitourinary Present- Frequency and Nocturia. Not Present- Painful Urination, Pelvic Pain and Urgency. Musculoskeletal Present- Back Pain and Muscle Pain. Not Present- Joint Pain, Joint Stiffness, Muscle Weakness and Swelling of Extremities. Psychiatric Not Present- Anxiety, Bipolar, Change in Sleep Pattern, Depression, Fearful and Frequent crying. Endocrine Not Present- Cold Intolerance, Excessive Hunger, Hair Changes, Heat Intolerance, Hot flashes and New Diabetes. Hematology Not Present- Easy Bruising, Excessive bleeding, Gland problems, HIV and Persistent Infections.  Vitals Erica Shan Moffitt RN; 01/16/2015 8:46 AM) 01/16/2015 8:46 AM Weight: 173.6 lb Height: 63in Body Surface Area: 1.87 m Body Mass  Index: 30.75 kg/m Temp.: 98.2F(Oral)  Pulse: 68 (Regular)  Resp.: 18 (Unlabored)  BP: 128/72 (Sitting, Left Arm, Standard)     Physical Exam Erica Hector MD; 01/16/2015 10:23 AM)  General Mental Status-Alert. General Appearance-Not in acute distress, Not Sickly. Orientation-Oriented X3. Hydration-Well hydrated. Voice-Normal.  Integumentary Global Assessment Upon inspection and palpation of skin surfaces of the - Axillae: non-tender, no inflammation or ulceration, no drainage. and Distribution of scalp and body hair is normal. General Characteristics Temperature - normal warmth is noted.  Head and Neck Head-normocephalic, atraumatic with no lesions or palpable masses. Face Global Assessment - atraumatic, no absence of expression. Neck Global Assessment - no abnormal movements, no bruit auscultated on the right, no bruit auscultated on the left, no decreased range of motion, non-tender. Trachea-midline. Thyroid Gland Characteristics - non-tender.  Eye Eyeball - Left-Extraocular movements intact, No Nystagmus. Eyeball - Right-Extraocular movements intact, No Nystagmus. Cornea - Left-No Hazy. Cornea - Right-No Hazy. Sclera/Conjunctiva - Left-No scleral icterus, No Discharge. Sclera/Conjunctiva - Right-No scleral icterus, No Discharge. Pupil - Left-Direct reaction to light normal. Pupil - Right-Direct reaction to light normal.  ENMT Ears Pinna - Left - no drainage observed, no generalized tenderness observed. Right - no drainage observed, no generalized tenderness observed. Nose and Sinuses External Inspection of the Nose - no destructive lesion observed. Inspection of the nares - Left - quiet respiration. Right - quiet respiration. Mouth and Throat Lips - Upper Lip - no fissures observed, no pallor noted. Lower Lip - no fissures observed, no pallor noted. Nasopharynx - no discharge present. Oral Cavity/Oropharynx - Tongue - no  dryness observed. Oral Mucosa - no cyanosis observed. Hypopharynx - no evidence of airway distress observed.  Chest and Lung Exam Inspection Movements - Normal and Symmetrical. Accessory muscles - No use of accessory muscles in breathing. Palpation Palpation of the chest reveals - Non-tender. Auscultation Breath sounds - Normal and Clear.  Cardiovascular Auscultation Rhythm - Regular. Murmurs & Other Heart Sounds - Auscultation of the heart reveals - No Murmurs and No Systolic Clicks.  Abdomen Inspection Inspection of the abdomen reveals - No Visible peristalsis and No Abnormal pulsations. Umbilicus - No Bleeding, No Urine drainage. Palpation/Percussion Palpation and Percussion of the abdomen reveal - Soft, Non Tender, No Rebound  tenderness, No Rigidity (guarding) and No Cutaneous hyperesthesia. Note: Overweight but soft. Midline incision without hernia. Mild discomfort in right side of abdomen but no discrete mass. No peritonitis or guarding.   Female Genitourinary Sexual Maturity Tanner 5 - Adult hair pattern. Note: No vaginal bleeding nor discharge   Peripheral Vascular Upper Extremity Inspection - Left - No Cyanotic nailbeds, Not Ischemic. Right - No Cyanotic nailbeds, Not Ischemic.  Neurologic Neurologic evaluation reveals -normal attention span and ability to concentrate, able to name objects and repeat phrases. Appropriate fund of knowledge , normal sensation and normal coordination. Mental Status Affect - not angry, not paranoid. Cranial Nerves-Normal Bilaterally. Gait-Normal.  Neuropsychiatric Mental status exam performed with findings of-able to articulate well with normal speech/language, rate, volume and coherence, thought content normal with ability to perform basic computations and apply abstract reasoning and no evidence of hallucinations, delusions, obsessions or homicidal/suicidal ideation.  Musculoskeletal Global Assessment Spine, Ribs and  Pelvis - no instability, subluxation or laxity. Right Upper Extremity - no instability, subluxation or laxity.  Lymphatic Head & Neck  General Head & Neck Lymphatics: Bilateral - Description - No Localized lymphadenopathy. Axillary  General Axillary Region: Bilateral - Description - No Localized lymphadenopathy. Femoral & Inguinal  Generalized Femoral & Inguinal Lymphatics: Left - Description - No Localized lymphadenopathy. Right - Description - No Localized lymphadenopathy.    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 Confirms cancer. I recommended surgical resection.  Standard of care would be segmental colonic resection. Reasonable to do robotic/laparoscopic approach. Intracorporeal anastomosis. Hopefully decrease pain and ileus and faster recovery. She has already got cardiac clearance by Dr. Wynonia Lawman. She wishes to be aggressive. I discussed with her. We will work to coordinate a convenient time.  The anatomy & physiology of the digestive tract was discussed. The pathophysiology of the colon was discussed. Natural history risks without surgery was discussed. I feel the risks of no intervention will lead to serious problems that outweigh the operative risks; therefore, I recommended a partial colectomy to remove the pathology. Minimally invasive (Robotic/Laparoscopic) & open techniques were discussed.  Risks such as bleeding, infection, abscess, leak, reoperation, possible ostomy, hernia, heart attack, death, and other risks were discussed. I noted a good likelihood this will help address the problem. Goals of post-operative recovery were discussed as well. Need for bowel regimen and healthy physical activity to optimize recovery noted as well. We will work to minimize complications. Educational materials were given as well. Questions were answered. The  patient expresses understanding & wishes to proceed with surgery.  Current Plans Schedule for Surgery The anatomy & physiology of the digestive tract was discussed. The pathophysiology of the colon was discussed. Natural history risks without surgery was discussed. I feel the risks of no intervention will lead to serious problems that outweigh the operative risks; therefore, I recommended a partial colectomy to remove the pathology. Minimally invasive (Robotic/Laparoscopic) & open techniques were discussed.  Risks such as bleeding, infection, abscess, leak, reoperation, possible ostomy, hernia, heart attack, death, and other risks were discussed. I noted a good likelihood this will help address the problem. Goals of post-operative recovery were discussed as well. Need for adequate nutrition, daily bowel regimen and healthy physical activity, to optimize recovery was noted as well. We will work to minimize complications. Educational materials were available as well. Questions were answered. The patient  expresses understanding & wishes to proceed with surgery. Pt Education - [facility] Abdominal Surgery (Maraki Macquarrie) Pt Education - [facility] Good Bowel Health (Dudley Mages) Pt Education - [facility] Pain Control (Ayyub Krall) Pt Education - [facility] Pelvic Floor Exercises (Kegels) and Dysfunction HCI (Layson Bertsch) Pt Education - [facility] Colon Bowel Prep 2015 Miralax/Antibiotics Started Neomycin Sulfate 500MG, 2 (two) Tablet SEE NOTE, #6, 01/16/2015, No Refill. Local Order: TAKE TWO TABLETS AT 2 PM, 3 PM, AND 10 PM THE DAY PRIOR TO SURGERY Started Flagyl 500MG, 2 (two) Tablet SEE NOTE, #6, 01/16/2015, No Refill. Local Order: Take at 2pm, 3pm, and 10pm the day prior to your colon operation  Erica Escobar, M.D., F.A.C.S. Gastrointestinal and Minimally Invasive Surgery [facility] [address] [place], [place] [address] [phone] Main / Paging

## 2015-02-11 ENCOUNTER — Encounter (HOSPITAL_COMMUNITY)
Admission: RE | Admit: 2015-02-11 | Discharge: 2015-02-11 | Disposition: A | Discharge status: Home / Self Care | Payer: Medicare Other | Source: Ambulatory Visit | Attending: Surgery | Admitting: Surgery

## 2015-02-11 ENCOUNTER — Encounter (HOSPITAL_COMMUNITY): Payer: Self-pay

## 2015-02-11 DIAGNOSIS — C182 Malignant neoplasm of ascending colon: Secondary | ICD-10-CM | POA: Insufficient documentation

## 2015-02-11 DIAGNOSIS — Z01818 Encounter for other preprocedural examination: Secondary | ICD-10-CM | POA: Diagnosis not present

## 2015-02-11 HISTORY — DX: Unspecified osteoarthritis, unspecified site: M19.90

## 2015-02-11 HISTORY — DX: Cramp and spasm: R25.2

## 2015-02-11 HISTORY — DX: Personal history of other diseases of the digestive system: Z87.19

## 2015-02-11 HISTORY — DX: Unspecified macular degeneration: H35.30

## 2015-02-11 HISTORY — DX: Gastro-esophageal reflux disease without esophagitis: K21.9

## 2015-02-11 HISTORY — DX: Hyperlipidemia, unspecified: E78.5

## 2015-02-11 HISTORY — DX: Unspecified hemorrhoids: K64.9

## 2015-02-11 HISTORY — DX: Other specified postprocedural states: Z98.890

## 2015-02-11 HISTORY — DX: Unspecified disorder of tympanic membrane, unspecified ear: H73.90

## 2015-02-11 HISTORY — DX: Restless legs syndrome: G25.81

## 2015-02-11 HISTORY — DX: Chronic obstructive pulmonary disease, unspecified: J44.9

## 2015-02-11 HISTORY — DX: Myoneural disorder, unspecified: G70.9

## 2015-02-11 HISTORY — DX: Unspecified glaucoma: H40.9

## 2015-02-11 HISTORY — DX: Diverticulosis of intestine, part unspecified, without perforation or abscess without bleeding: K57.90

## 2015-02-11 HISTORY — DX: Depression, unspecified: F32.A

## 2015-02-11 HISTORY — DX: Long term (current) use of aspirin: Z79.82

## 2015-02-11 HISTORY — DX: Other constipation: K59.09

## 2015-02-11 HISTORY — DX: Actinic keratosis: L57.0

## 2015-02-11 HISTORY — DX: Insomnia, unspecified: G47.00

## 2015-02-11 HISTORY — DX: Major depressive disorder, single episode, unspecified: F32.9

## 2015-02-11 HISTORY — DX: Essential (primary) hypertension: I10

## 2015-02-11 HISTORY — DX: Unspecified malignant neoplasm of skin, unspecified: C44.90

## 2015-02-11 HISTORY — DX: Unspecified urinary incontinence: R32

## 2015-02-11 HISTORY — DX: Other specified postprocedural states: R11.2

## 2015-02-11 LAB — CBC
HCT: 33.1 % — ABNORMAL LOW (ref 36.0–46.0)
HEMOGLOBIN: 10.7 g/dL — AB (ref 12.0–15.0)
MCH: 29.2 pg (ref 26.0–34.0)
MCHC: 32.3 g/dL (ref 30.0–36.0)
MCV: 90.2 fL (ref 78.0–100.0)
Platelets: 323 10*3/uL (ref 150–400)
RBC: 3.67 MIL/uL — ABNORMAL LOW (ref 3.87–5.11)
RDW: 14.4 % (ref 11.5–15.5)
WBC: 6.6 10*3/uL (ref 4.0–10.5)

## 2015-02-11 LAB — BASIC METABOLIC PANEL
Anion gap: 9 (ref 5–15)
BUN: 18 mg/dL (ref 6–23)
CHLORIDE: 100 mmol/L (ref 96–112)
CO2: 30 mmol/L (ref 19–32)
Calcium: 9.4 mg/dL (ref 8.4–10.5)
Creatinine, Ser: 1.11 mg/dL — ABNORMAL HIGH (ref 0.50–1.10)
GFR calc non Af Amer: 47 mL/min — ABNORMAL LOW (ref >90)
GFR, EST AFRICAN AMERICAN: 54 mL/min — AB (ref >90)
GLUCOSE: 96 mg/dL (ref 70–99)
POTASSIUM: 4.1 mmol/L (ref 3.5–5.1)
Sodium: 139 mmol/L (ref 135–145)

## 2015-02-11 LAB — ABO/RH: ABO/RH(D): A POS

## 2015-02-11 NOTE — Patient Instructions (Addendum)
20 Erica Escobar  02/11/2015   Your procedure is scheduled on:   -02-15-2015  Enter through [facility]  Entrance and follow signs to [facility]. Arrive at      [place]  AM.  Call this number if you have problems the morning of surgery: [phone]  Or Presurgical Testing [phone].   For Living Will and/or Health Care Power Attorney Forms: please provide copy for your medical record,may bring AM of surgery(Forms should be already notarized -we do not provide this service).(02-11-15 No information preferred today).  Remember: Follow any bowel prep instructions per MD office.   Do not eat food/ or drink: After Midnight.      Take these medicines the morning of surgery with A SIP OF WATER: Amlodipine. .Gemfibrozil. Omeprazole. Use/bring eye drops.   Do not wear jewelry, make-up or nail polish.  Do not wear deodorant, lotions, powders, or perfumes.   Do not shave legs and under arms- 48 hours(2 days) prior to first CHG shower.(Shaving face and neck okay.)  Do not bring valuables to the hospital.(Hospital is not responsible for lost valuables).  Contacts, dentures or removable bridgework, body piercing, hair pins may not be worn into surgery.  Leave suitcase in the car. After surgery it may be brought to your room.  For patients admitted to the hospital, checkout time is 11:00 AM the day of discharge.(Restricted visitors-Any Persons displaying flu-like symptoms or illness).    Patients discharged the day of surgery will not be allowed to drive home. Must have responsible person with you x 24 hours once discharged.  Name and phone number of your driver: Erica Escobar will provide phone number AM of.     Please read over the following fact sheets that you were given:  CHG(Chlorhexidine Gluconate 4% Surgical Soap) use.  Remember : Type/Screen "Blue armbands" - may not be removed once applied(would result in being retested AM of surgery, if removed).         [place] -  Preparing for Surgery Before surgery, you can play an important role.  Because skin is not sterile, your skin needs to be as free of germs as possible.  You can reduce the number of germs on your skin by washing with CHG (chlorahexidine gluconate) soap before surgery.  CHG is an antiseptic cleaner which kills germs and bonds with the skin to continue killing germs even after washing. Please DO NOT use if you have an allergy to CHG or antibacterial soaps.  If your skin becomes reddened/irritated stop using the CHG and inform your nurse when you arrive at Short Stay. Do not shave (including legs and underarms) for at least 48 hours prior to the first CHG shower.  You may shave your face/neck. Please follow these instructions carefully:  1.  Shower with CHG Soap the night before surgery and the  morning of Surgery.  2.  If you choose to wash your hair, wash your hair first as usual with your  normal  shampoo.  3.  After you shampoo, rinse your hair and body thoroughly to remove the  shampoo.                           4.  Use CHG as you would any other liquid soap.  You can apply chg directly  to the skin and wash  Gently with a scrungie or clean washcloth.  5.  Apply the CHG Soap to your body ONLY FROM THE NECK DOWN.   Do not use on face/ open                           Wound or open sores. Avoid contact with eyes, ears mouth and genitals (private parts).                       Wash face,  Genitals (private parts) with your normal soap.             6.  Wash thoroughly, paying special attention to the area where your surgery  will be performed.  7.  Thoroughly rinse your body with warm water from the neck down.  8.  DO NOT shower/wash with your normal soap after using and rinsing off  the CHG Soap.                9.  Pat yourself dry with a clean towel.            10.  Wear clean pajamas.            11.  Place clean sheets on your bed the night of your first shower and do not  sleep  with pets. Day of Surgery : Do not apply any lotions/deodorants the morning of surgery.  Please wear clean clothes to the hospital/surgery center.  FAILURE TO FOLLOW THESE INSTRUCTIONS MAY RESULT IN THE CANCELLATION OF YOUR SURGERY PATIENT SIGNATURE_________________________________  NURSE SIGNATURE__________________________________  ________________________________________________________________________

## 2015-02-11 NOTE — Pre-Procedure Instructions (Signed)
02-11-15 EKG 1'16, Stress 1'16, CXR 12'15, LOV notes Dr. Shelia Media 01-11-15- reports with chart.

## 2015-02-11 NOTE — Progress Notes (Signed)
CBC and BMP results done 02/11/2015 faxed via EPIC to Dr Johney Maine.

## 2015-02-12 LAB — HEMOGLOBIN A1C
HEMOGLOBIN A1C: 5.1 % (ref 4.8–5.6)
Mean Plasma Glucose: 100 mg/dL

## 2015-02-14 MED ORDER — GENTAMICIN SULFATE 40 MG/ML IJ SOLN: 1.0000 "application " | INTRAMUSCULAR | Status: DC

## 2015-02-14 MED ORDER — GENTAMICIN SULFATE 40 MG/ML IJ SOLN
5.0000 mg/kg | INTRAVENOUS | Status: AC
  Administered 2015-02-15: 310 mg via INTRAVENOUS
  Filled 2015-02-14 (×2): qty 7.75

## 2015-02-14 MED ORDER — CLINDAMYCIN PHOSPHATE 900 MG/50ML IV SOLN
900.0000 mg | INTRAVENOUS | Status: AC
  Administered 2015-02-15: 900 mg via INTRAVENOUS

## 2015-02-14 MED ORDER — GENTAMICIN SULFATE 40 MG/ML IJ SOLN
INTRAMUSCULAR | Status: DC
  Filled 2015-02-14: qty 6

## 2015-02-15 ENCOUNTER — Inpatient Hospital Stay (HOSPITAL_COMMUNITY)
Admission: RE | Admit: 2015-02-15 | Discharge: 2015-02-19 | DRG: 330 | Disposition: A | Discharge status: Home / Self Care | Payer: Medicare Other | Source: Ambulatory Visit | Attending: Surgery | Admitting: Surgery

## 2015-02-15 ENCOUNTER — Encounter (HOSPITAL_COMMUNITY): Payer: Self-pay

## 2015-02-15 ENCOUNTER — Inpatient Hospital Stay (HOSPITAL_COMMUNITY): Payer: Medicare Other | Admitting: Anesthesiology

## 2015-02-15 ENCOUNTER — Encounter (HOSPITAL_COMMUNITY)
Admission: RE | Disposition: A | Discharge status: Home / Self Care | Payer: Self-pay | Source: Ambulatory Visit | Attending: Surgery

## 2015-02-15 DIAGNOSIS — Z79899 Other long term (current) drug therapy: Secondary | ICD-10-CM

## 2015-02-15 DIAGNOSIS — C183 Malignant neoplasm of hepatic flexure: Secondary | ICD-10-CM | POA: Diagnosis not present

## 2015-02-15 DIAGNOSIS — Z01812 Encounter for preprocedural laboratory examination: Secondary | ICD-10-CM | POA: Diagnosis not present

## 2015-02-15 DIAGNOSIS — M1990 Unspecified osteoarthritis, unspecified site: Secondary | ICD-10-CM | POA: Diagnosis not present

## 2015-02-15 DIAGNOSIS — G4700 Insomnia, unspecified: Secondary | ICD-10-CM | POA: Diagnosis present

## 2015-02-15 DIAGNOSIS — K639 Disease of intestine, unspecified: Secondary | ICD-10-CM | POA: Diagnosis present

## 2015-02-15 DIAGNOSIS — Z87891 Personal history of nicotine dependence: Secondary | ICD-10-CM | POA: Diagnosis not present

## 2015-02-15 DIAGNOSIS — Z809 Family history of malignant neoplasm, unspecified: Secondary | ICD-10-CM | POA: Diagnosis not present

## 2015-02-15 DIAGNOSIS — J449 Chronic obstructive pulmonary disease, unspecified: Secondary | ICD-10-CM | POA: Diagnosis present

## 2015-02-15 DIAGNOSIS — I10 Essential (primary) hypertension: Secondary | ICD-10-CM | POA: Diagnosis present

## 2015-02-15 DIAGNOSIS — Z8249 Family history of ischemic heart disease and other diseases of the circulatory system: Secondary | ICD-10-CM

## 2015-02-15 DIAGNOSIS — K449 Diaphragmatic hernia without obstruction or gangrene: Secondary | ICD-10-CM | POA: Diagnosis not present

## 2015-02-15 DIAGNOSIS — F329 Major depressive disorder, single episode, unspecified: Secondary | ICD-10-CM | POA: Diagnosis not present

## 2015-02-15 DIAGNOSIS — C189 Malignant neoplasm of colon, unspecified: Secondary | ICD-10-CM | POA: Diagnosis not present

## 2015-02-15 DIAGNOSIS — E876 Hypokalemia: Secondary | ICD-10-CM | POA: Diagnosis not present

## 2015-02-15 DIAGNOSIS — Z833 Family history of diabetes mellitus: Secondary | ICD-10-CM

## 2015-02-15 DIAGNOSIS — G2581 Restless legs syndrome: Secondary | ICD-10-CM | POA: Diagnosis present

## 2015-02-15 DIAGNOSIS — D62 Acute posthemorrhagic anemia: Secondary | ICD-10-CM | POA: Diagnosis not present

## 2015-02-15 DIAGNOSIS — C182 Malignant neoplasm of ascending colon: Secondary | ICD-10-CM | POA: Diagnosis present

## 2015-02-15 DIAGNOSIS — Z7982 Long term (current) use of aspirin: Secondary | ICD-10-CM | POA: Diagnosis not present

## 2015-02-15 DIAGNOSIS — F32A Depression, unspecified: Secondary | ICD-10-CM | POA: Diagnosis present

## 2015-02-15 DIAGNOSIS — K219 Gastro-esophageal reflux disease without esophagitis: Secondary | ICD-10-CM | POA: Diagnosis not present

## 2015-02-15 HISTORY — DX: Malignant neoplasm of ascending colon: C18.2

## 2015-02-15 HISTORY — DX: Esophageal obstruction: K22.2

## 2015-02-15 LAB — TYPE AND SCREEN
ABO/RH(D): A POS
Antibody Screen: NEGATIVE

## 2015-02-15 SURGERY — COLECTOMY, PARTIAL, ROBOT-ASSISTED, LAPAROSCOPIC
Anesthesia: General | Site: Abdomen

## 2015-02-15 MED ORDER — SACCHAROMYCES BOULARDII 250 MG PO CAPS
250.0000 mg | ORAL_CAPSULE | Freq: Two times a day (BID) | ORAL | Status: DC
  Administered 2015-02-15 – 2015-02-18 (×8): 250 mg via ORAL
  Filled 2015-02-15 (×10): qty 1

## 2015-02-15 MED ORDER — NEOSTIGMINE METHYLSULFATE 10 MG/10ML IV SOLN
INTRAVENOUS | Status: DC | PRN
  Administered 2015-02-15: 3 mg via INTRAVENOUS

## 2015-02-15 MED ORDER — GLYCOPYRROLATE 0.2 MG/ML IJ SOLN
INTRAMUSCULAR | Status: DC | PRN
  Administered 2015-02-15: 0.4 mg via INTRAVENOUS

## 2015-02-15 MED ORDER — ACETAMINOPHEN 500 MG PO TABS
1000.0000 mg | ORAL_TABLET | Freq: Three times a day (TID) | ORAL | Status: DC
  Administered 2015-02-15 – 2015-02-18 (×11): 1000 mg via ORAL
  Filled 2015-02-15 (×14): qty 2

## 2015-02-15 MED ORDER — BUPIVACAINE-EPINEPHRINE 0.25% -1:200000 IJ SOLN
INTRAMUSCULAR | Status: DC | PRN
  Administered 2015-02-15: 75 mL

## 2015-02-15 MED ORDER — LIP MEDEX EX OINT
1.0000 "application " | TOPICAL_OINTMENT | Freq: Two times a day (BID) | CUTANEOUS | Status: DC
  Administered 2015-02-15 – 2015-02-19 (×8): 1 via TOPICAL

## 2015-02-15 MED ORDER — KETOROLAC TROMETHAMINE 30 MG/ML IJ SOLN
INTRAMUSCULAR | Status: AC
  Filled 2015-02-15: qty 1

## 2015-02-15 MED ORDER — ALVIMOPAN 12 MG PO CAPS
12.0000 mg | ORAL_CAPSULE | Freq: Two times a day (BID) | ORAL | Status: DC
  Administered 2015-02-16: 12 mg via ORAL
  Filled 2015-02-15 (×2): qty 1

## 2015-02-15 MED ORDER — SODIUM CHLORIDE 0.9 % IV SOLN
INTRAVENOUS | Status: DC
  Administered 2015-02-15: 75 mL via INTRAVENOUS

## 2015-02-15 MED ORDER — EPHEDRINE SULFATE 50 MG/ML IJ SOLN
INTRAMUSCULAR | Status: DC | PRN
  Administered 2015-02-15: 5 mg via INTRAVENOUS

## 2015-02-15 MED ORDER — PROMETHAZINE HCL 25 MG/ML IJ SOLN: 6.2500 mg | INTRAMUSCULAR | Status: DC | PRN

## 2015-02-15 MED ORDER — METOPROLOL TARTRATE 1 MG/ML IV SOLN
5.0000 mg | Freq: Four times a day (QID) | INTRAVENOUS | Status: DC
  Administered 2015-02-15 – 2015-02-18 (×7): 5 mg via INTRAVENOUS
  Filled 2015-02-15 (×16): qty 5

## 2015-02-15 MED ORDER — CISATRACURIUM BESYLATE 20 MG/10ML IV SOLN
INTRAVENOUS | Status: AC
  Filled 2015-02-15: qty 10

## 2015-02-15 MED ORDER — ONDANSETRON HCL 4 MG PO TABS: 4.0000 mg | ORAL_TABLET | Freq: Four times a day (QID) | ORAL | Status: DC | PRN

## 2015-02-15 MED ORDER — CALCIUM CARBONATE-VITAMIN D 500-200 MG-UNIT PO TABS
1.0000 | ORAL_TABLET | Freq: Every morning | ORAL | Status: DC
  Administered 2015-02-15 – 2015-02-18 (×4): 1 via ORAL
  Filled 2015-02-15 (×5): qty 1

## 2015-02-15 MED ORDER — EPHEDRINE SULFATE 50 MG/ML IJ SOLN
INTRAMUSCULAR | Status: AC
  Filled 2015-02-15: qty 1

## 2015-02-15 MED ORDER — HYDROMORPHONE HCL 1 MG/ML IJ SOLN
0.2500 mg | INTRAMUSCULAR | Status: DC | PRN
  Administered 2015-02-15: 0.25 mg via INTRAVENOUS

## 2015-02-15 MED ORDER — PANTOPRAZOLE SODIUM 40 MG PO TBEC
40.0000 mg | DELAYED_RELEASE_TABLET | Freq: Every day | ORAL | Status: DC
  Administered 2015-02-16 – 2015-02-18 (×3): 40 mg via ORAL
  Filled 2015-02-15 (×5): qty 1

## 2015-02-15 MED ORDER — MENTHOL 3 MG MT LOZG
1.0000 | LOZENGE | OROMUCOSAL | Status: DC | PRN
  Filled 2015-02-15: qty 9

## 2015-02-15 MED ORDER — SODIUM CHLORIDE 0.9 % IJ SOLN
3.0000 mL | Freq: Two times a day (BID) | INTRAMUSCULAR | Status: DC
  Administered 2015-02-17 (×2): 3 mL via INTRAVENOUS

## 2015-02-15 MED ORDER — DIPHENHYDRAMINE HCL 50 MG/ML IJ SOLN: 12.5000 mg | Freq: Four times a day (QID) | INTRAMUSCULAR | Status: DC | PRN

## 2015-02-15 MED ORDER — ASPIRIN EC 81 MG PO TBEC
81.0000 mg | DELAYED_RELEASE_TABLET | Freq: Every morning | ORAL | Status: DC
  Administered 2015-02-15 – 2015-02-17 (×3): 81 mg via ORAL
  Filled 2015-02-15 (×4): qty 1

## 2015-02-15 MED ORDER — CHLORHEXIDINE GLUCONATE 4 % EX LIQD: 60.0000 mL | Freq: Once | CUTANEOUS | Status: DC

## 2015-02-15 MED ORDER — ONDANSETRON HCL 4 MG/2ML IJ SOLN
INTRAMUSCULAR | Status: AC
Start: 2015-02-15 — End: 2015-02-15
  Filled 2015-02-15: qty 2

## 2015-02-15 MED ORDER — ROPINIROLE HCL 0.5 MG PO TABS
0.5000 mg | ORAL_TABLET | Freq: Every day | ORAL | Status: DC
  Administered 2015-02-15 – 2015-02-18 (×4): 0.5 mg via ORAL
  Filled 2015-02-15 (×5): qty 1

## 2015-02-15 MED ORDER — PHENOL 1.4 % MT LIQD
2.0000 | OROMUCOSAL | Status: DC | PRN
  Filled 2015-02-15: qty 177

## 2015-02-15 MED ORDER — GEMFIBROZIL 600 MG PO TABS
600.0000 mg | ORAL_TABLET | Freq: Every day | ORAL | Status: DC
  Administered 2015-02-16 – 2015-02-19 (×4): 600 mg via ORAL
  Filled 2015-02-15 (×6): qty 1

## 2015-02-15 MED ORDER — ONDANSETRON HCL 4 MG/2ML IJ SOLN
INTRAMUSCULAR | Status: DC | PRN
  Administered 2015-02-15: 4 mg via INTRAVENOUS

## 2015-02-15 MED ORDER — OMEGA-3-ACID ETHYL ESTERS 1 G PO CAPS
1.0000 g | ORAL_CAPSULE | Freq: Every day | ORAL | Status: DC
  Administered 2015-02-15 – 2015-02-18 (×4): 1 g via ORAL
  Filled 2015-02-15 (×5): qty 1

## 2015-02-15 MED ORDER — LIDOCAINE HCL (CARDIAC) 20 MG/ML IV SOLN
INTRAVENOUS | Status: AC
  Filled 2015-02-15: qty 5

## 2015-02-15 MED ORDER — DEXAMETHASONE SODIUM PHOSPHATE 10 MG/ML IJ SOLN
INTRAMUSCULAR | Status: AC
  Filled 2015-02-15: qty 1

## 2015-02-15 MED ORDER — ZOLPIDEM TARTRATE 5 MG PO TABS: 5.0000 mg | ORAL_TABLET | Freq: Every evening | ORAL | Status: DC | PRN

## 2015-02-15 MED ORDER — SODIUM CHLORIDE 0.9 % IV SOLN
INTRAVENOUS | Status: DC | PRN
  Administered 2015-02-15: 11:00:00 via INTRAPERITONEAL

## 2015-02-15 MED ORDER — VITAMIN C 500 MG PO TABS
500.0000 mg | ORAL_TABLET | Freq: Every day | ORAL | Status: DC
Start: 2015-02-15 — End: 2015-02-19
  Administered 2015-02-15 – 2015-02-18 (×4): 500 mg via ORAL
  Filled 2015-02-15 (×5): qty 1

## 2015-02-15 MED ORDER — MAGIC MOUTHWASH
15.0000 mL | Freq: Four times a day (QID) | ORAL | Status: DC | PRN
  Filled 2015-02-15: qty 15

## 2015-02-15 MED ORDER — 0.9 % SODIUM CHLORIDE (POUR BTL) OPTIME
TOPICAL | Status: DC | PRN
  Administered 2015-02-15: 2000 mL

## 2015-02-15 MED ORDER — LORATADINE 10 MG PO TABS
10.0000 mg | ORAL_TABLET | Freq: Every day | ORAL | Status: DC | PRN
  Filled 2015-02-15: qty 1

## 2015-02-15 MED ORDER — AMLODIPINE BESYLATE 5 MG PO TABS
5.0000 mg | ORAL_TABLET | Freq: Every morning | ORAL | Status: DC
  Administered 2015-02-16 – 2015-02-18 (×3): 5 mg via ORAL
  Filled 2015-02-15 (×4): qty 1

## 2015-02-15 MED ORDER — OCUVITE PO TABS
1.0000 | ORAL_TABLET | Freq: Two times a day (BID) | ORAL | Status: DC
  Administered 2015-02-15 – 2015-02-18 (×7): 1 via ORAL
  Filled 2015-02-15 (×11): qty 1

## 2015-02-15 MED ORDER — CISATRACURIUM BESYLATE (PF) 10 MG/5ML IV SOLN
INTRAVENOUS | Status: DC | PRN
  Administered 2015-02-15: 4 mg via INTRAVENOUS
  Administered 2015-02-15 (×2): 2 mg via INTRAVENOUS
  Administered 2015-02-15: 10 mg via INTRAVENOUS

## 2015-02-15 MED ORDER — ADULT MULTIVITAMIN W/MINERALS CH
1.0000 | ORAL_TABLET | Freq: Every morning | ORAL | Status: DC
  Administered 2015-02-15 – 2015-02-18 (×4): 1 via ORAL
  Filled 2015-02-15 (×5): qty 1

## 2015-02-15 MED ORDER — PROPOFOL 10 MG/ML IV BOLUS
INTRAVENOUS | Status: AC
  Filled 2015-02-15: qty 20

## 2015-02-15 MED ORDER — PROPOFOL 10 MG/ML IV BOLUS
INTRAVENOUS | Status: DC | PRN
  Administered 2015-02-15: 120 mg via INTRAVENOUS

## 2015-02-15 MED ORDER — DIPHENHYDRAMINE HCL 12.5 MG/5ML PO ELIX: 12.5000 mg | ORAL_SOLUTION | Freq: Four times a day (QID) | ORAL | Status: DC | PRN

## 2015-02-15 MED ORDER — ONDANSETRON HCL 4 MG/2ML IJ SOLN
4.0000 mg | Freq: Four times a day (QID) | INTRAMUSCULAR | Status: DC | PRN
  Administered 2015-02-17: 4 mg via INTRAVENOUS
  Filled 2015-02-15: qty 2

## 2015-02-15 MED ORDER — SODIUM CHLORIDE 0.9 % IJ SOLN
3.0000 mL | INTRAMUSCULAR | Status: DC | PRN
  Administered 2015-02-16: 3 mL via INTRAVENOUS
  Filled 2015-02-15: qty 3

## 2015-02-15 MED ORDER — FENTANYL CITRATE 0.05 MG/ML IJ SOLN
INTRAMUSCULAR | Status: DC | PRN
  Administered 2015-02-15 (×5): 50 ug via INTRAVENOUS

## 2015-02-15 MED ORDER — LIDOCAINE HCL (CARDIAC) 20 MG/ML IV SOLN
INTRAVENOUS | Status: DC | PRN
  Administered 2015-02-15: 40 mg via INTRAVENOUS

## 2015-02-15 MED ORDER — HEPARIN SODIUM (PORCINE) 5000 UNIT/ML IJ SOLN
5000.0000 [IU] | Freq: Once | INTRAMUSCULAR | Status: AC
  Administered 2015-02-15: 5000 [IU] via SUBCUTANEOUS
  Filled 2015-02-15: qty 1

## 2015-02-15 MED ORDER — HYDROMORPHONE HCL 1 MG/ML IJ SOLN
INTRAMUSCULAR | Status: AC
  Filled 2015-02-15: qty 1

## 2015-02-15 MED ORDER — LACTATED RINGERS IV SOLN
INTRAVENOUS | Status: DC | PRN
  Administered 2015-02-15 (×3): via INTRAVENOUS

## 2015-02-15 MED ORDER — LIP MEDEX EX OINT
TOPICAL_OINTMENT | CUTANEOUS | Status: AC
  Administered 2015-02-15: 14:00:00
  Filled 2015-02-15: qty 7

## 2015-02-15 MED ORDER — KETOROLAC TROMETHAMINE 30 MG/ML IJ SOLN
INTRAMUSCULAR | Status: DC | PRN
  Administered 2015-02-15: 15 mg via INTRAVENOUS

## 2015-02-15 MED ORDER — SODIUM CHLORIDE 0.9 % IV SOLN
250.0000 mL | INTRAVENOUS | Status: DC | PRN
Start: 2015-02-15 — End: 2015-02-19

## 2015-02-15 MED ORDER — CLINDAMYCIN PHOSPHATE 900 MG/50ML IV SOLN
INTRAVENOUS | Status: AC
  Filled 2015-02-15: qty 50

## 2015-02-15 MED ORDER — LACTATED RINGERS IV BOLUS (SEPSIS): 1000.0000 mL | Freq: Three times a day (TID) | INTRAVENOUS | Status: AC | PRN

## 2015-02-15 MED ORDER — ALVIMOPAN 12 MG PO CAPS
12.0000 mg | ORAL_CAPSULE | Freq: Once | ORAL | Status: AC
  Administered 2015-02-15: 12 mg via ORAL
  Filled 2015-02-15: qty 1

## 2015-02-15 MED ORDER — NORTRIPTYLINE HCL 10 MG PO CAPS
20.0000 mg | ORAL_CAPSULE | Freq: Every day | ORAL | Status: DC
  Administered 2015-02-15 – 2015-02-17 (×3): 20 mg via ORAL
  Filled 2015-02-15 (×6): qty 4

## 2015-02-15 MED ORDER — ALUM & MAG HYDROXIDE-SIMETH 200-200-20 MG/5ML PO SUSP: 30.0000 mL | Freq: Four times a day (QID) | ORAL | Status: DC | PRN

## 2015-02-15 MED ORDER — FENTANYL CITRATE 0.05 MG/ML IJ SOLN
INTRAMUSCULAR | Status: AC
  Filled 2015-02-15: qty 5

## 2015-02-15 MED ORDER — HEPARIN SODIUM (PORCINE) 5000 UNIT/ML IJ SOLN
5000.0000 [IU] | Freq: Three times a day (TID) | INTRAMUSCULAR | Status: DC
  Administered 2015-02-16 – 2015-02-17 (×4): 5000 [IU] via SUBCUTANEOUS
  Filled 2015-02-15 (×7): qty 1

## 2015-02-15 MED ORDER — LACTATED RINGERS IR SOLN
Status: DC | PRN
  Administered 2015-02-15: 1000 mL

## 2015-02-15 MED ORDER — BUPIVACAINE-EPINEPHRINE (PF) 0.25% -1:200000 IJ SOLN
INTRAMUSCULAR | Status: AC
  Filled 2015-02-15: qty 30

## 2015-02-15 MED ORDER — HYDROMORPHONE HCL 1 MG/ML IJ SOLN
0.5000 mg | INTRAMUSCULAR | Status: DC | PRN
  Administered 2015-02-15 – 2015-02-17 (×10): 1 mg via INTRAVENOUS
  Administered 2015-02-17 (×2): 0.5 mg via INTRAVENOUS
  Administered 2015-02-17: 1 mg via INTRAVENOUS
  Administered 2015-02-18 (×2): 0.5 mg via INTRAVENOUS
  Filled 2015-02-15 (×15): qty 1

## 2015-02-15 MED ORDER — DEXAMETHASONE SODIUM PHOSPHATE 10 MG/ML IJ SOLN
INTRAMUSCULAR | Status: DC | PRN
  Administered 2015-02-15: 10 mg via INTRAVENOUS

## 2015-02-15 MED ORDER — BUPIVACAINE-EPINEPHRINE (PF) 0.25% -1:200000 IJ SOLN
INTRAMUSCULAR | Status: AC
Start: 2015-02-15 — End: 2015-02-15
  Filled 2015-02-15: qty 60

## 2015-02-15 MED ORDER — SODIUM CHLORIDE 0.9 % IJ SOLN
INTRAMUSCULAR | Status: AC
  Filled 2015-02-15: qty 10

## 2015-02-15 MED ORDER — TRAMADOL HCL 50 MG PO TABS: 50.0000 mg | ORAL_TABLET | Freq: Four times a day (QID) | ORAL | Status: DC | PRN

## 2015-02-15 SURGICAL SUPPLY — 119 items
APPLIER CLIP 5 13 M/L LIGAMAX5 (MISCELLANEOUS) ×3
APPLIER CLIP ROT 10 11.4 M/L (STAPLE)
APR CLP MED LRG 11.4X10 (STAPLE)
APR CLP MED LRG 5 ANG JAW (MISCELLANEOUS) ×1
BAG SPEC RTRVL LRG 6X4 10 (ENDOMECHANICALS) ×1
BLADE EXTENDED COATED 6.5IN (ELECTRODE) ×3 IMPLANT
BLADE SURG SZ11 CARB STEEL (BLADE) ×3 IMPLANT
CABLE HIGH FREQUENCY MONO STRZ (ELECTRODE) IMPLANT
CANNULA REDUC XI 12-8 STAPL (CANNULA) ×1
CANNULA REDUC XI 12-8MM STAPL (CANNULA) ×1
CANNULA REDUCER 12-8 DVNC XI (CANNULA) IMPLANT
CATH KIT ON-Q SILVERSOAK 7.5 (CATHETERS) IMPLANT
CATH KIT ON-Q SILVERSOAK 7.5IN (CATHETERS) IMPLANT
CATH ROBINSON RED A/P 22FR (CATHETERS) IMPLANT
CELLS DAT CNTRL 66122 CELL SVR (MISCELLANEOUS) ×1 IMPLANT
CLIP APPLIE 5 13 M/L LIGAMAX5 (MISCELLANEOUS) IMPLANT
CLIP APPLIE ROT 10 11.4 M/L (STAPLE) IMPLANT
CLIP LIGATING HEM O LOK PURPLE (MISCELLANEOUS) ×4 IMPLANT
CLIP LIGATING HEMO O LOK GREEN (MISCELLANEOUS) IMPLANT
CLIP LIGATING HEMOLOK MED (MISCELLANEOUS) ×2 IMPLANT
COVER TIP SHEARS 8 DVNC (MISCELLANEOUS) ×1 IMPLANT
COVER TIP SHEARS 8MM DA VINCI (MISCELLANEOUS) ×2
DECANTER SPIKE VIAL GLASS SM (MISCELLANEOUS) ×2 IMPLANT
DEVICE TROCAR PUNCTURE CLOSURE (ENDOMECHANICALS) IMPLANT
DRAIN CHANNEL 19F RND (DRAIN) IMPLANT
DRAPE ARM DVNC X/XI (DISPOSABLE) ×4 IMPLANT
DRAPE COLUMN DVNC XI (DISPOSABLE) ×1 IMPLANT
DRAPE DA VINCI XI ARM (DISPOSABLE) ×8
DRAPE DA VINCI XI COLUMN (DISPOSABLE) ×2
DRAPE SURG IRRIG POUCH 19X23 (DRAPES) ×3 IMPLANT
DRSG OPSITE POSTOP 4X10 (GAUZE/BANDAGES/DRESSINGS) IMPLANT
DRSG OPSITE POSTOP 4X6 (GAUZE/BANDAGES/DRESSINGS) IMPLANT
DRSG OPSITE POSTOP 4X8 (GAUZE/BANDAGES/DRESSINGS) IMPLANT
DRSG TEGADERM 2-3/8X2-3/4 SM (GAUZE/BANDAGES/DRESSINGS) ×9 IMPLANT
DRSG TEGADERM 4X4.75 (GAUZE/BANDAGES/DRESSINGS) ×2 IMPLANT
ELECT PENCIL ROCKER SW 15FT (MISCELLANEOUS) ×6 IMPLANT
ELECT REM PT RETURN 9FT ADLT (ELECTROSURGICAL) ×3
ELECTRODE REM PT RTRN 9FT ADLT (ELECTROSURGICAL) ×1 IMPLANT
ENDOLOOP SUT PDS II  0 18 (SUTURE)
ENDOLOOP SUT PDS II 0 18 (SUTURE) IMPLANT
EVACUATOR SILICONE 100CC (DRAIN) IMPLANT
GAUZE SPONGE 2X2 8PLY STRL LF (GAUZE/BANDAGES/DRESSINGS) ×1 IMPLANT
GAUZE SPONGE 4X4 12PLY STRL (GAUZE/BANDAGES/DRESSINGS) IMPLANT
GLOVE BIO SURGEON STRL SZ 6.5 (GLOVE) ×2 IMPLANT
GLOVE BIO SURGEONS STRL SZ 6.5 (GLOVE) ×2
GLOVE BIOGEL PI IND STRL 7.0 (GLOVE) IMPLANT
GLOVE BIOGEL PI IND STRL 7.5 (GLOVE) IMPLANT
GLOVE BIOGEL PI INDICATOR 7.0 (GLOVE) ×4
GLOVE BIOGEL PI INDICATOR 7.5 (GLOVE) ×4
GLOVE ECLIPSE 8.0 STRL XLNG CF (GLOVE) ×9 IMPLANT
GLOVE INDICATOR 8.0 STRL GRN (GLOVE) ×9 IMPLANT
GLOVE SURG SS PI 6.5 STRL IVOR (GLOVE) ×4 IMPLANT
GOWN STRL REUS W/TWL XL LVL3 (GOWN DISPOSABLE) ×16 IMPLANT
KIT PROCEDURE DA VINCI SI (MISCELLANEOUS)
KIT PROCEDURE DVNC SI (MISCELLANEOUS) ×1 IMPLANT
LEGGING LITHOTOMY PAIR STRL (DRAPES) ×3 IMPLANT
LUBRICANT JELLY K Y 4OZ (MISCELLANEOUS) IMPLANT
NDL INSUFFLATION 14GA 120MM (NEEDLE) ×1 IMPLANT
NEEDLE INSUFFLATION 14GA 120MM (NEEDLE) ×3 IMPLANT
PACK CARDIOVASCULAR III (CUSTOM PROCEDURE TRAY) ×3 IMPLANT
PACK COLON (CUSTOM PROCEDURE TRAY) ×3 IMPLANT
PEN SKIN MARKING BROAD (MISCELLANEOUS) ×3 IMPLANT
PORT LAP GEL ALEXIS MED 5-9CM (MISCELLANEOUS) IMPLANT
POUCH SPECIMEN RETRIEVAL 10MM (ENDOMECHANICALS) ×2 IMPLANT
PUMP PAIN ON-Q (MISCELLANEOUS) IMPLANT
RELOAD STAPLE 45 BLU REG DVNC (STAPLE) IMPLANT
RELOAD STAPLE 45 GRN THCK DVNC (STAPLE) IMPLANT
RETRACTOR WND ALEXIS 18 MED (MISCELLANEOUS) IMPLANT
RTRCTR WOUND ALEXIS 18CM MED (MISCELLANEOUS) ×3
SCISSORS LAP 5X45 EPIX DISP (ENDOMECHANICALS) IMPLANT
SCRUB PCMX 4 OZ (MISCELLANEOUS) ×3 IMPLANT
SEAL CANN UNIV 5-8 DVNC XI (MISCELLANEOUS) ×3 IMPLANT
SEAL XI 5MM-8MM UNIVERSAL (MISCELLANEOUS) ×6
SEALER TISSUE G2 STRG ARTC 35C (ENDOMECHANICALS) IMPLANT
SEALER VESSEL DA VINCI XI (MISCELLANEOUS)
SEALER VESSEL EXT DVNC XI (MISCELLANEOUS) IMPLANT
SET IRRIG TUBING LAPAROSCOPIC (IRRIGATION / IRRIGATOR) ×3 IMPLANT
SLEEVE XCEL OPT CAN 5 100 (ENDOMECHANICALS) ×2 IMPLANT
SOLUTION ELECTROLUBE (MISCELLANEOUS) ×3 IMPLANT
SPONGE GAUZE 2X2 STER 10/PKG (GAUZE/BANDAGES/DRESSINGS) ×2
STAPLER 45 BLU RELOAD XI (STAPLE) ×3 IMPLANT
STAPLER 45 BLUE RELOAD XI (STAPLE) ×6
STAPLER 45 GREEN RELOAD XI (STAPLE)
STAPLER 45 GRN RELOAD XI (STAPLE) IMPLANT
STAPLER CANNULA SEAL DVNC XI (STAPLE) ×1 IMPLANT
STAPLER CANNULA SEAL XI (STAPLE) ×4
STAPLER SHEATH (SHEATH)
STAPLER SHEATH ENDOWRIST DVNC (SHEATH) IMPLANT
SUT MNCRL AB 4-0 PS2 18 (SUTURE) ×3 IMPLANT
SUT PDS AB 1 CTX 36 (SUTURE) ×10 IMPLANT
SUT PDS AB 1 TP1 96 (SUTURE) IMPLANT
SUT PDS AB 2-0 CT2 27 (SUTURE) IMPLANT
SUT PROLENE 0 CT 2 (SUTURE) ×1 IMPLANT
SUT PROLENE 2 0 SH DA (SUTURE) IMPLANT
SUT SILK 2 0 (SUTURE) ×3
SUT SILK 2 0 SH (SUTURE) ×2 IMPLANT
SUT SILK 2 0 SH CR/8 (SUTURE) ×3 IMPLANT
SUT SILK 2-0 18XBRD TIE 12 (SUTURE) ×1 IMPLANT
SUT SILK 3 0 (SUTURE) ×3
SUT SILK 3 0 SH CR/8 (SUTURE) ×3 IMPLANT
SUT SILK 3-0 18XBRD TIE 12 (SUTURE) ×1 IMPLANT
SUT V-LOC BARB 180 2/0GR6 GS22 (SUTURE) ×6
SUT VIC AB 3-0 SH 18 (SUTURE) IMPLANT
SUT VIC AB 3-0 SH 27 (SUTURE) ×6
SUT VIC AB 3-0 SH 27XBRD (SUTURE) IMPLANT
SUT VICRYL 0 UR6 27IN ABS (SUTURE) ×5 IMPLANT
SUT VLOC 180 2-0 9IN GS21 (SUTURE) IMPLANT
SUTURE V-LC BRB 180 2/0GR6GS22 (SUTURE) IMPLANT
SYRINGE 10CC LL (SYRINGE) ×3 IMPLANT
SYS LAPSCP GELPORT 120MM (MISCELLANEOUS)
SYSTEM LAPSCP GELPORT 120MM (MISCELLANEOUS) IMPLANT
TAPE UMBILICAL COTTON 1/8X30 (MISCELLANEOUS) ×3 IMPLANT
TOWEL OR NON WOVEN STRL DISP B (DISPOSABLE) ×3 IMPLANT
TRAY FOLEY CATH 14FRSI W/METER (CATHETERS) ×3 IMPLANT
TROCAR BLADELESS OPT 5 100 (ENDOMECHANICALS) ×3 IMPLANT
TUBING CONNECTING 10 (TUBING) IMPLANT
TUBING CONNECTING 10' (TUBING)
TUBING FILTER THERMOFLATOR (ELECTROSURGICAL) ×3 IMPLANT
TUNNELER SHEATH ON-Q 16GX12 DP (PAIN MANAGEMENT) IMPLANT

## 2015-02-15 NOTE — Anesthesia Procedure Notes (Signed)
Procedure Name: Intubation Date/Time: 02/15/2015 7:38 AM Performed by: Glory Buff Pre-anesthesia Checklist: Patient identified, Emergency Drugs available, Suction available, Patient being monitored and Timeout performed Patient Re-evaluated:Patient Re-evaluated prior to inductionOxygen Delivery Method: Circle system utilized Preoxygenation: Pre-oxygenation with 100% oxygen Intubation Type: IV induction Ventilation: Mask ventilation without difficulty Laryngoscope Size: Miller and 3 Grade View: Grade I Tube type: Oral Tube size: 7.0 mm Number of attempts: 1 Airway Equipment and Method: Stylet Placement Confirmation: ETT inserted through vocal cords under direct vision,  positive ETCO2 and breath sounds checked- equal and bilateral Secured at: 20 cm Tube secured with: Tape Dental Injury: Teeth and Oropharynx as per pre-operative assessment

## 2015-02-15 NOTE — Anesthesia Postprocedure Evaluation (Signed)
  Anesthesia Post-op Note  Patient: Erica Escobar  Procedure(s) Performed: Procedure(s) (LRB): XI ROBOT ASSISTED PROXIMAL RIGHT COLON (N/A)  Patient Location: PACU  Anesthesia Type: General  Level of Consciousness: awake and alert   Airway and Oxygen Therapy: Patient Spontanous Breathing  Post-op Pain: mild  Post-op Assessment: Post-op Vital signs reviewed, Patient's Cardiovascular Status Stable, Respiratory Function Stable, Patent Airway and No signs of Nausea or vomiting  Last Vitals:  Filed Vitals:   02/15/15 1430  BP: 158/70  Pulse: 60  Temp: 36.7 C  Resp: 18    Post-op Vital Signs: stable   Complications: No apparent anesthesia complications

## 2015-02-15 NOTE — Interval H&P Note (Signed)
History and Physical Interval Note:  02/15/2015 6:59 AM  Erica Escobar  has presented today for surgery, with the diagnosis of Cancer of Hepatic Flexure of Colon  The various methods of treatment have been discussed with the patient and family. After consideration of risks, benefits and other options for treatment, the patient has consented to  Procedure(s): XI [place] (N/A) as a surgical intervention .  The patient's history has been reviewed, patient examined, no change in status, stable for surgery.  I have reviewed the patient's chart and labs.  Questions were answered to the patient's satisfaction.     Markasia Carrol C.

## 2015-02-15 NOTE — Op Note (Signed)
02/15/2015  11:22 AM  PATIENT:  Erica Escobar  79 y.o. female  Patient Care Team: Horatio Pel, MD as PCP - General (Internal Medicine) Michael Boston, MD as Consulting Physician (General Surgery) Beryle Beams, MD as Consulting Physician (Gastroenterology) Jacolyn Reedy, MD as Consulting Physician (Cardiology)  PRE-OPERATIVE DIAGNOSIS:  Cancer of Hepatic Flexure of Colon  POST-OPERATIVE DIAGNOSIS:  Cancer of Hepatic Flexure of Colon  PROCEDURE:  Procedure(s): XI ROBOT ASSISTED PROXIMAL RIGHT COLECTOMY  SURGEON:  Surgeon(s): Michael Boston, MD Leighton Ruff, MD - Assist   ANESTHESIA:   local and general  EBL:  Total I/O In: 2000 [I.V.:2000] Out: 250 [Urine:100; Blood:150]  Delay start of Pharmacological VTE agent (>24hrs) due to surgical blood loss or risk of bleeding:  no  DRAINS: none   SPECIMEN:  PROXIMAL COLON  DISPOSITION OF SPECIMEN:  PATHOLOGY  COUNTS:  YES  PLAN OF CARE: Admit to inpatient   PATIENT DISPOSITION:  PACU - hemodynamically stable.  INDICATION:    Pleasant woman with anemia.  Found to have bleeding cancer hepatic flexure of colon.  Cleared by cardiology.  I recommended segmental resection:  The anatomy & physiology of the digestive tract was discussed.  The pathophysiology was discussed.  Natural history risks without surgery was discussed.   I worked to give an overview of the disease and the frequent need to have multispecialty involvement.  I feel the risks of no intervention will lead to serious problems that outweigh the operative risks; therefore, I recommended a partial colectomy to remove the pathology.  Laparoscopic & open techniques were discussed.   Risks such as bleeding, infection, abscess, Escobar, reoperation, possible ostomy, hernia, heart attack, death, and other risks were discussed.  I noted a good likelihood this will help address the problem.   Goals of post-operative recovery were discussed as well.  We will work to  minimize complications.  An educational handout on the pathology was given as well.  Questions were answered.    The patient expresses understanding & wishes to proceed with surgery.  OR FINDINGS:   Patient had obvious mass tattooed just proximal to the hepatic flexure of the colon.  No obvious metastatic disease on visceral parietal peritoneum or liver.  It is an ileocolonic anastomosis (isoperistalstic side to side) that rests in the epigastric region.  DESCRIPTION:   Informed consent was confirmed.  The patient underwent general anaesthesia without difficulty.  The patient was positioned with arms tucked & secured appropriately.  VTE prevention in place.  The patient's abdomen was clipped, prepped, & draped in a sterile fashion.  Surgical timeout confirmed our plan.  The patient was positioned in reverse Trendelenburg.  Abdominal entry was gained using Veress needle technique using trach hook on the anterior abdominal wall fascia for countertension in the left upper abdomen.  Entry was clean.  I induced carbon dioxide insufflation.  Camera inspection revealed no injury.  Extra ports were carefully placed under direct laparoscopic visualization.  We docked the [employer] Vinci robot carefully and placed intstruments under visualization  I mobilized & reflected the greater omentum into the upper abdomen.  Obviously tattooed mass found in the distal ascending colon near the hepatic flexure.  I was able to elevate the proximal colon to isolate the ileocolonic pedicle.  I scored the ileal mesentery just proximal to that.   I carried that further dissection in a medial to lateral fashion.  I was able to bluntly get into the retro-mesenteric plane on the right side.  I freed the proximal right sided colonic mesentery off the retroperitoneum including the duodenal sweep, pancreatic head, & Gerota's fascia of the right kidney. I was able to get underneath the hepatic flexure.  I was able to get  underneath the proximal and mid transverse colon.  I isolated the proximal ileocecal pedicle.  I skeletonized it & transected the vessels.  There was a small side branch liter that was controlled with 5 mm clip applier.  Inspection of the duodenal sweep showed a small 6 mm serosal tear.  I covered that using a 2-0 silk suture using robotic intracorporeal suturing  I then proceeded to mobilize the terminal ileum & proximal "right" colon in a lateral to medial fashion.  I mobilized the distal ileal mesentery off its retroperitoneal and pelvic attachments.  I mobilized the ascending colon off It is side wall attachments to the paracolic gutter and retroperitoneum.  I also mobilized the greater omentum off the mid transverse colon and mobilized the mid to proximal transverse colon in a superior to inferior fashion.  This allowed me to mobilize the hepatic flexure and get a complete mobilization of the proximal "right" colon to the mid-transverse colon..  I could isolate the pathology.  We went ahead and proceeded with transection.  We took the distal ileal mesentery radially using Vessel sealer energy.   Also transected the transverse colon mesentery radially just proximal to the right middle colic vessel pedicle in the mid transverse colon.  I transected in the distal ileum with a robotic stapler.  Then transected at the proximal transverse colon with a robotic stapler. We assured hemostasis.   I did a side-to-side stapled anastomosis of ileum to mid-transverse colon using a 36m robotic stapler.  I sewed the common staple channel wound with an absorbable suture, 2-0 V-lock x 2 in a running CGreenwoodfashion starting at each corners and meeting in the middle..  We did reinspection of the abdomen.  Hemostasis was good.   Ureters, retroperitoneum, and bowel uninjured.  The anastomosis looked healthy.   We did a final irrigation of antibiotic solution (900 mg clindamycin/240 mg gentamicin in a liter of crystalloid) &  held that while we placed the wound protector through the 12 mm robotic port site in the left lower quadrant after extending the skin and anterior rectus fascia transversely for a wound of 6 cm.  Split through the rectus and peritoneum vertically.  Wound protector placed.  Specimen removed without incident.   Ports and wound protector removed.  The patient was re-draped.  Sterile unused instruments were used from this point out per colon SSI prevention protocol.  A closed the peritoneum at the extraction site with the rectus using 0 Vicryl suture vertically.  I closed the anterior rectus fascia transversely with PDS suture.   Skin closed using Monocryl stitch and sterile dressing.  I closed the skin with some interrupted Monocryl stitches. I placed wicks in between those areas. I placed sterile dressing.   Patient is being extubated go to recovery room. I discussed postop care with the patient in detail the office & in the holding area. Instructions are written.  I updated the patient's status to the family.  Recommendations were made.  Questions were answered.  The family expressed understanding & appreciation.  SAdin Hector M.D., F.A.C.S. Gastrointestinal and Minimally Invasive Surgery [facility] [address] [place] [phone] / Paging

## 2015-02-15 NOTE — Transfer of Care (Signed)
Immediate Anesthesia Transfer of Care Note  Patient: Erica Escobar  Procedure(s) Performed: Procedure(s): XI ROBOT ASSISTED PROXIMAL RIGHT COLON (N/A)  Patient Location: PACU  Anesthesia Type:General  Level of Consciousness: awake, alert  and oriented  Airway & Oxygen Therapy: Patient Spontanous Breathing and Patient connected to face mask oxygen  Post-op Assessment: Report given to RN and Post -op Vital signs reviewed and stable  Post vital signs: Reviewed and stable  Last Vitals:  Filed Vitals:   02/15/15 0506  BP: 136/85  Pulse: 81  Temp: 36.7 C  Resp: 16    Complications: No apparent anesthesia complications

## 2015-02-15 NOTE — Progress Notes (Signed)
Pt tolerated clear liquids well with no nausea or pain. Advanced to fulls. Will continue to monitor.

## 2015-02-15 NOTE — H&P (View-Only) (Signed)
Erica Escobar 01/16/2015 8:45 AM Location: [facility] Patient #: [id number] DOB: 01-25-1936 Married / Language: [name] / Race: White Female History of Present Illness [name]; 01/16/2015 10:22 AM) Patient words: eval colon mass.  The patient is a 79 year old female who presents with colorectal cancer. Patient sent by her gastroenterologist Dr. Carol Ada for mass and ascending colon causing anemia. Concerning for cancer. Pleasant elderly but active woman. Has been struggling with anemia and weakness. History of normal colonoscopies in the past. Underwent gastroenterology evaluation. Upper endoscopy normal. Lower endoscopy showed mass in the hepatic flexure. Biopsy showing poorly differentiated adenocarcinoma and ascending colon polyp as well as a mass in the hepatic flexure showing cancer. Most likely same area. Surgical consultation requested. Patient normally has a bowel movement every day. No history of bed constipation and diarrhea. She can walk 20 minutes without difficulty. No history of cardiac issues or strokes. Had an abdominal hysterectomy many decades ago. Tolerated laparoscopic cholecystectomy 4 years ago. She lives at home with her husband. He is wheelchair-bound. She takes care of him. Has some friends in town but no major family members. She is taking iron. That seems to have helped things. Surgical consultation recommended. No history Crohn's or colitis. Not limited bowel disease. No history of ulcers or heartburn issues. Takes 81 mg aspirin for general prevention. Not on any aggressive blood thinners. Other Problems [name], RN; 01/16/2015 8:46 AM) Arthritis Back Pain Bladder Problems Chest pain Cholelithiasis Depression Gastroesophageal Reflux Disease General anesthesia - complications Hemorrhoids High blood pressure Hypercholesterolemia Oophorectomy  Past Surgical History [name], RN; 01/16/2015  8:52 AM) Cataract Surgery Bilateral. Colon Polyp Removal - Open Foot Surgery Right. Gallbladder Surgery - Open Hysterectomy (not due to cancer) - Complete Knee Surgery Right. Tonsillectomy Catheterization, Right & Left Heart Hemorrhoidectomy Gallbladder Surgery bladder tack  Diagnostic Studies History [name], RN; 01/16/2015 8:46 AM) Colonoscopy within last year Mammogram within last year Pap Smear >5 years ago  Allergies [name], RN; 01/16/2015 8:46 AM) Gabapentin *ANTICONVULSANTS* Swelling.  Medication History [name], RN; 01/16/2015 8:50 AM) Potassium Chloride ER (10MEQ Capsule ER, Oral daily) Active. AmLODIPine Besylate (5MG Tablet, Oral daily) Active. Gemfibrozil (600MG Tablet, Oral daily) Active. Lasix (40MG Tablet, Oral daily) Active. Aspirin Low Strength (81MG Tablet Chewable, Oral daily) Active. Loratadine Allergy Relief (10MG Tablet Disperse, Oral as needed) Active. Citracal + D (250-200MG-UNIT Tablet, Oral daily) Active. Aleve (220MG Capsule, Oral as needed) Active. Ocuvite Adult 50+ (Oral daily) Active. Multivitamin Adults 50+ (Oral daily) Active. MiraLax (Oral as needed) Active. Fish Oil Burp-Less (1000MG Capsule, Oral daily) Active. Omeprazole (20MG Capsule DR, Oral daily) Active. Nortriptyline HCl (10MG Capsule, Oral daily) Active. B12-Active (1MG Tablet Chewable, Oral daily) Active. ROPINIRole HCl (0.5MG Tablet, Oral daily) Active. Pantoprazole Sodium (40MG Tablet DR, Oral daily) Active.  Social History [name], RN; 01/16/2015 8:46 AM) Caffeine use Carbonated beverages, Tea. No alcohol use No drug use  Family History [name], RN; 01/16/2015 8:46 AM) Arthritis Mother. Cancer Brother, Father. Depression Mother. Diabetes Mellitus Mother. Heart Disease Father. Hypertension Father, Mother.  Pregnancy / Birth History [name], RN; 01/16/2015 8:46 AM) Age at menarche 39  years. Gravida 2 Maternal age 1-25 Para 2     Review of Systems [name] RN; 01/16/2015 8:46 AM) General Present- Fatigue and Weight Loss. Not Present- Appetite Loss, Chills, Fever, Night Sweats and Weight Gain. Skin Present- Dryness. Not Present- Change in Wart/Mole, Hives, Jaundice, New Lesions, Non-Healing Wounds, Rash and Ulcer. HEENT  Present- Seasonal Allergies and Wears glasses/contact lenses. Not Present- Earache, Hearing Loss, Hoarseness, Nose Bleed, Oral Ulcers, Ringing in the Ears, Sinus Pain, Sore Throat, Visual Disturbances and Yellow Eyes. Respiratory Present- Snoring. Not Present- Bloody sputum, Chronic Cough, Difficulty Breathing and Wheezing. Breast Not Present- Breast Mass, Breast Pain, Nipple Discharge and Skin Changes. Cardiovascular Present- Leg Cramps. Not Present- Chest Pain, Difficulty Breathing Lying Down, Palpitations, Rapid Heart Rate, Shortness of Breath and Swelling of Extremities. Gastrointestinal Present- Abdominal Pain, Bloating, Constipation and Hemorrhoids. Not Present- Bloody Stool, Change in Bowel Habits, Chronic diarrhea, Difficulty Swallowing, Excessive gas, Gets full quickly at meals, Indigestion, Nausea, Rectal Pain and Vomiting. Female Genitourinary Present- Frequency and Nocturia. Not Present- Painful Urination, Pelvic Pain and Urgency. Musculoskeletal Present- Back Pain and Muscle Pain. Not Present- Joint Pain, Joint Stiffness, Muscle Weakness and Swelling of Extremities. Psychiatric Not Present- Anxiety, Bipolar, Change in Sleep Pattern, Depression, Fearful and Frequent crying. Endocrine Not Present- Cold Intolerance, Excessive Hunger, Hair Changes, Heat Intolerance, Hot flashes and New Diabetes. Hematology Not Present- Easy Bruising, Excessive bleeding, Gland problems, HIV and Persistent Infections.  Vitals [name] RN; 01/16/2015 8:46 AM) 01/16/2015 8:46 AM Weight: 173.6 lb Height: 63in Body Surface Area: 1.87 m Body Mass  Index: 30.75 kg/m Temp.: 98.47F(Oral)  Pulse: 68 (Regular)  Resp.: 18 (Unlabored)  BP: 128/72 (Sitting, Left Arm, Standard)     Physical Exam [name]; 01/16/2015 10:23 AM)  General Mental Status-Alert. General Appearance-Not in acute distress, Not Sickly. Orientation-Oriented X3. Hydration-Well hydrated. Voice-Normal.  Integumentary Global Assessment Upon inspection and palpation of skin surfaces of the - Axillae: non-tender, no inflammation or ulceration, no drainage. and Distribution of scalp and body hair is normal. General Characteristics Temperature - normal warmth is noted.  Head and Neck Head-normocephalic, atraumatic with no lesions or palpable masses. Face Global Assessment - atraumatic, no absence of expression. Neck Global Assessment - no abnormal movements, no bruit auscultated on the right, no bruit auscultated on the left, no decreased range of motion, non-tender. Trachea-midline. Thyroid Gland Characteristics - non-tender.  Eye Eyeball - Left-Extraocular movements intact, No Nystagmus. Eyeball - Right-Extraocular movements intact, No Nystagmus. Cornea - Left-No Hazy. Cornea - Right-No Hazy. Sclera/Conjunctiva - Left-No scleral icterus, No Discharge. Sclera/Conjunctiva - Right-No scleral icterus, No Discharge. Pupil - Left-Direct reaction to light normal. Pupil - Right-Direct reaction to light normal.  ENMT Ears Pinna - Left - no drainage observed, no generalized tenderness observed. Right - no drainage observed, no generalized tenderness observed. Nose and Sinuses External Inspection of the Nose - no destructive lesion observed. Inspection of the nares - Left - quiet respiration. Right - quiet respiration. Mouth and Throat Lips - Upper Lip - no fissures observed, no pallor noted. Lower Lip - no fissures observed, no pallor noted. Nasopharynx - no discharge present. Oral Cavity/Oropharynx - Tongue - no  dryness observed. Oral Mucosa - no cyanosis observed. Hypopharynx - no evidence of airway distress observed.  Chest and Lung Exam Inspection Movements - Normal and Symmetrical. Accessory muscles - No use of accessory muscles in breathing. Palpation Palpation of the chest reveals - Non-tender. Auscultation Breath sounds - Normal and Clear.  Cardiovascular Auscultation Rhythm - Regular. Murmurs & Other Heart Sounds - Auscultation of the heart reveals - No Murmurs and No Systolic Clicks.  Abdomen Inspection Inspection of the abdomen reveals - No Visible peristalsis and No Abnormal pulsations. Umbilicus - No Bleeding, No Urine drainage. Palpation/Percussion Palpation and Percussion of the abdomen reveal - Soft, Non Tender, No Rebound  tenderness, No Rigidity (guarding) and No Cutaneous hyperesthesia. Note: Overweight but soft. Midline incision without hernia. Mild discomfort in right side of abdomen but no discrete mass. No peritonitis or guarding.   Female Genitourinary Sexual Maturity Tanner 5 - Adult hair pattern. Note: No vaginal bleeding nor discharge   Peripheral Vascular Upper Extremity Inspection - Left - No Cyanotic nailbeds, Not Ischemic. Right - No Cyanotic nailbeds, Not Ischemic.  Neurologic Neurologic evaluation reveals -normal attention span and ability to concentrate, able to name objects and repeat phrases. Appropriate fund of knowledge , normal sensation and normal coordination. Mental Status Affect - not angry, not paranoid. Cranial Nerves-Normal Bilaterally. Gait-Normal.  Neuropsychiatric Mental status exam performed with findings of-able to articulate well with normal speech/language, rate, volume and coherence, thought content normal with ability to perform basic computations and apply abstract reasoning and no evidence of hallucinations, delusions, obsessions or homicidal/suicidal ideation.  Musculoskeletal Global Assessment Spine, Ribs and  Pelvis - no instability, subluxation or laxity. Right Upper Extremity - no instability, subluxation or laxity.  Lymphatic Head & Neck  General Head & Neck Lymphatics: Bilateral - Description - No Localized lymphadenopathy. Axillary  General Axillary Region: Bilateral - Description - No Localized lymphadenopathy. Femoral & Inguinal  Generalized Femoral & Inguinal Lymphatics: Left - Description - No Localized lymphadenopathy. Right - Description - No Localized lymphadenopathy.    Assessment & Plan [name]; 01/16/2015 10:23 AM)  PRIMARY CANCER OF HEPATIC FLEXURE OF COLON (153.0  C18.3) Impression: We were able to get the rest of the records. Confirms cancer. I recommended surgical resection.  Standard of care would be segmental colonic resection. Reasonable to do robotic/laparoscopic approach. Intracorporeal anastomosis. Hopefully decrease pain and ileus and faster recovery. She has already got cardiac clearance by Dr. Wynonia Lawman. She wishes to be aggressive. I discussed with her. We will work to coordinate a convenient time.  The anatomy & physiology of the digestive tract was discussed. The pathophysiology of the colon was discussed. Natural history risks without surgery was discussed. I feel the risks of no intervention will lead to serious problems that outweigh the operative risks; therefore, I recommended a partial colectomy to remove the pathology. Minimally invasive (Robotic/Laparoscopic) & open techniques were discussed.  Risks such as bleeding, infection, abscess, leak, reoperation, possible ostomy, hernia, heart attack, death, and other risks were discussed. I noted a good likelihood this will help address the problem. Goals of post-operative recovery were discussed as well. Need for bowel regimen and healthy physical activity to optimize recovery noted as well. We will work to minimize complications. Educational materials were given as well. Questions were answered. The  patient expresses understanding & wishes to proceed with surgery.  Current Plans Schedule for Surgery The anatomy & physiology of the digestive tract was discussed. The pathophysiology of the colon was discussed. Natural history risks without surgery was discussed. I feel the risks of no intervention will lead to serious problems that outweigh the operative risks; therefore, I recommended a partial colectomy to remove the pathology. Minimally invasive (Robotic/Laparoscopic) & open techniques were discussed.  Risks such as bleeding, infection, abscess, leak, reoperation, possible ostomy, hernia, heart attack, death, and other risks were discussed. I noted a good likelihood this will help address the problem. Goals of post-operative recovery were discussed as well. Need for adequate nutrition, daily bowel regimen and healthy physical activity, to optimize recovery was noted as well. We will work to minimize complications. Educational materials were available as well. Questions were answered. The patient  expresses understanding & wishes to proceed with surgery. Pt Education - [facility] Abdominal Surgery (Josemiguel Gries) Pt Education - [facility] Good Bowel Health (Juanmiguel Defelice) Pt Education - [facility] Pain Control (Elsa Ploch) Pt Education - [facility] Pelvic Floor Exercises (Kegels) and Dysfunction HCI (Ernesteen Mihalic) Pt Education - [facility] Colon Bowel Prep 2015 Miralax/Antibiotics Started Neomycin Sulfate 500MG, 2 (two) Tablet SEE NOTE, #6, 01/16/2015, No Refill. Local Order: TAKE TWO TABLETS AT 2 PM, 3 PM, AND 10 PM THE DAY PRIOR TO SURGERY Started Flagyl 500MG, 2 (two) Tablet SEE NOTE, #6, 01/16/2015, No Refill. Local Order: Take at 2pm, 3pm, and 10pm the day prior to your colon operation  [name], M.D., F.A.C.S. Gastrointestinal and Minimally Invasive Surgery [facility] [address] [place], [place] [address] [phone] Main / Paging

## 2015-02-15 NOTE — Anesthesia Preprocedure Evaluation (Addendum)
Anesthesia Evaluation  Patient identified by MRN, date of birth, ID band Patient awake    Reviewed: Allergy & Precautions, NPO status , Patient's Chart, lab work & pertinent test results  History of Anesthesia Complications (+) PONV and history of anesthetic complications  Airway Mallampati: II  TM Distance: >3 FB Neck ROM: Full    Dental  (+) Edentulous Lower, Edentulous Upper   Pulmonary COPDformer smoker,  breath sounds clear to auscultation  Pulmonary exam normal       Cardiovascular hypertension, Pt. on medications Rhythm:Regular Rate:Normal     Neuro/Psych PSYCHIATRIC DISORDERS Depression  Neuromuscular disease    GI/Hepatic Neg liver ROS, hiatal hernia, GERD-  ,  Endo/Other  negative endocrine ROS  Renal/GU negative Renal ROS  negative genitourinary   Musculoskeletal  (+) Arthritis -,   Abdominal (+) + obese,   Peds negative pediatric ROS (+)  Hematology negative hematology ROS (+)   Anesthesia Other Findings   Reproductive/Obstetrics negative OB ROS                           Anesthesia Physical Anesthesia Plan  ASA: II  Anesthesia Plan: General   Post-op Pain Management:    Induction: Intravenous  Airway Management Planned: Oral ETT  Additional Equipment:   Intra-op Plan:   Post-operative Plan: Extubation in OR  Informed Consent: I have reviewed the patients History and Physical, chart, labs and discussed the procedure including the risks, benefits and alternatives for the proposed anesthesia with the patient or authorized representative who has indicated his/her understanding and acceptance.   Dental advisory given  Plan Discussed with: CRNA  Anesthesia Plan Comments:         Anesthesia Quick Evaluation

## 2015-02-16 LAB — CBC
HEMATOCRIT: 25.1 % — AB (ref 36.0–46.0)
Hemoglobin: 8.4 g/dL — ABNORMAL LOW (ref 12.0–15.0)
MCH: 29.6 pg (ref 26.0–34.0)
MCHC: 33.5 g/dL (ref 30.0–36.0)
MCV: 88.4 fL (ref 78.0–100.0)
PLATELETS: 232 10*3/uL (ref 150–400)
RBC: 2.84 MIL/uL — ABNORMAL LOW (ref 3.87–5.11)
RDW: 14.3 % (ref 11.5–15.5)
WBC: 10.2 10*3/uL (ref 4.0–10.5)

## 2015-02-16 LAB — BASIC METABOLIC PANEL
ANION GAP: 5 (ref 5–15)
BUN: 11 mg/dL (ref 6–23)
CALCIUM: 8.3 mg/dL — AB (ref 8.4–10.5)
CO2: 26 mmol/L (ref 19–32)
Chloride: 105 mmol/L (ref 96–112)
Creatinine, Ser: 0.9 mg/dL (ref 0.50–1.10)
GFR, EST AFRICAN AMERICAN: 70 mL/min — AB (ref >90)
GFR, EST NON AFRICAN AMERICAN: 61 mL/min — AB (ref >90)
Glucose, Bld: 98 mg/dL (ref 70–99)
Potassium: 3.4 mmol/L — ABNORMAL LOW (ref 3.5–5.1)
Sodium: 136 mmol/L (ref 135–145)

## 2015-02-16 LAB — MAGNESIUM: MAGNESIUM: 1.7 mg/dL (ref 1.5–2.5)

## 2015-02-16 MED ORDER — POTASSIUM CHLORIDE IN NACL 20-0.9 MEQ/L-% IV SOLN
INTRAVENOUS | Status: DC
  Administered 2015-02-16 – 2015-02-18 (×4): via INTRAVENOUS
  Filled 2015-02-16 (×4): qty 1000

## 2015-02-16 NOTE — Progress Notes (Signed)
1 Day Post-Op  Subjective: Her pain is under control. She was up and walking several times yesterday with assistance. She had 2 episodes of fecal incontinence yesterday that were liquid stool with flatus. No nausea, vomiting, or bloating. She is using incentive spirometer as directed.    Objective: Vital signs in last 24 hours: Temp:  [97.6 F (36.4 C)-98.9 F (37.2 C)] 98.9 F (37.2 C) (03/05 0552) Pulse Rate:  [60-85] 72 (03/05 0552) Resp:  [12-18] 18 (03/05 0552) BP: (122-159)/(53-77) 139/62 mmHg (03/05 0552) SpO2:  [96 %-100 %] 100 % (03/05 0552) Weight:  [171 lb 4.8 oz (77.7 kg)] 171 lb 4.8 oz (77.7 kg) (03/05 0208) Last BM Date: 02/16/15  Intake/Output from previous day: 03/04 0701 - 03/05 0700 In: 4378.8 [P.O.:510; I.V.:3048.8] Out: 701 [Urine:550; Stool:1; Blood:150] Intake/Output this shift:    PE: General- In NAD CV - RRR Lungs - CTA b/l Abdomen- Soft, tender around incision sites. Dressings clean and intact. Hypoactive bowel sounds.  Lab Results:   Recent Labs  02/16/15 0513  WBC 10.2  HGB 8.4*  HCT 25.1*  PLT 232   BMET  Recent Labs  02/16/15 0513  NA 136  K 3.4*  CL 105  CO2 26  GLUCOSE 98  BUN 11  CREATININE 0.90  CALCIUM 8.3*   PT/INR No results for input(s): LABPROT, INR in the last 72 hours. Comprehensive Metabolic Panel:    Component Value Date/Time   NA 136 02/16/2015 0513   NA 139 02/11/2015 1200   K 3.4* 02/16/2015 0513   K 4.1 02/11/2015 1200   CL 105 02/16/2015 0513   CL 100 02/11/2015 1200   CO2 26 02/16/2015 0513   CO2 30 02/11/2015 1200   BUN 11 02/16/2015 0513   BUN 18 02/11/2015 1200   CREATININE 0.90 02/16/2015 0513   CREATININE 1.11* 02/11/2015 1200   GLUCOSE 98 02/16/2015 0513   GLUCOSE 96 02/11/2015 1200   CALCIUM 8.3* 02/16/2015 0513   CALCIUM 9.4 02/11/2015 1200   AST 24 03/11/2009 0450   AST 13 11/27/2008 0003   ALT 15 03/11/2009 0450   ALT 11 11/27/2008 0003   ALKPHOS 74 03/11/2009 0450   ALKPHOS 84  11/27/2008 0003   BILITOT 0.6 03/11/2009 0450   BILITOT 0.6 11/27/2008 0003   PROT 6.7 03/11/2009 0450   PROT 6.1 11/27/2008 0003   ALBUMIN 3.5 03/11/2009 0450   ALBUMIN 3.4* 11/27/2008 0003     Studies/Results: No results found.  Anti-infectives: Anti-infectives    Start     Dose/Rate Route Frequency Ordered Stop   02/15/15 1107  clindamycin (CLEOCIN) 900 mg, gentamicin (GARAMYCIN) 240 mg in sodium chloride 0.9 % 1,000 mL for intraperitoneal lavage  Status:  Discontinued       As needed 02/15/15 1107 02/15/15 1108   02/15/15 0600  clindamycin (CLEOCIN) 900 mg, gentamicin (GARAMYCIN) 240 mg in sodium chloride 0.9 % 1,000 mL for intraperitoneal lavage  Status:  Discontinued      Intraperitoneal On call to O.R. 02/14/15 1528 02/15/15 1148   02/15/15 0600  clindamycin (CLEOCIN) IVPB 900 mg     900 mg 100 mL/hr over 30 Minutes Intravenous On call to O.R. 02/14/15 1530 02/15/15 0810   02/15/15 0600  gentamicin (GARAMYCIN) 310 mg in dextrose 5 % 100 mL IVPB     5 mg/kg  62.6 kg (Adjusted) 107.8 mL/hr over 60 Minutes Intravenous On call to O.R. 02/14/15 1530 02/15/15 0840   02/14/15 1530  clindamycin (CLEOCIN) 900 mg, gentamicin (GARAMYCIN)  240 mg in sodium chloride 0.9 % 1,000 mL for intraperitoneal lavage  Status:  Discontinued    Comments:  Pharmacy may adjust dosing strength, schedule, rate of infusion, etc as needed to optimize therapy   1 application Intraperitoneal To Surgery 02/14/15 1525 02/14/15 1528      Assessment Principal Problem:   Cancer of ascending colon s/p robotic colectomy 02/15/2015-some ABL anemia Active Problems:   Hypokalemia   Restless leg syndrome   Depression   Insomnia   COPD (chronic obstructive pulmonary disease)   GERD (gastroesophageal reflux disease)    LOS: 1 day   Plan: Continue on full liquid diet. Continue to ambulate and use incentive spirometer. Correct potassium.  Repeat lab tomorrow.   Gwenith Spitz PA-S 02/16/2015

## 2015-02-17 LAB — CBC
HCT: 25.8 % — ABNORMAL LOW (ref 36.0–46.0)
HEMATOCRIT: 22.8 % — AB (ref 36.0–46.0)
HEMOGLOBIN: 7.6 g/dL — AB (ref 12.0–15.0)
Hemoglobin: 8.7 g/dL — ABNORMAL LOW (ref 12.0–15.0)
MCH: 29.8 pg (ref 26.0–34.0)
MCH: 30.5 pg (ref 26.0–34.0)
MCHC: 33.3 g/dL (ref 30.0–36.0)
MCHC: 33.7 g/dL (ref 30.0–36.0)
MCV: 89.4 fL (ref 78.0–100.0)
MCV: 90.5 fL (ref 78.0–100.0)
PLATELETS: 200 10*3/uL (ref 150–400)
Platelets: 230 10*3/uL (ref 150–400)
RBC: 2.55 MIL/uL — ABNORMAL LOW (ref 3.87–5.11)
RBC: 2.85 MIL/uL — ABNORMAL LOW (ref 3.87–5.11)
RDW: 14.8 % (ref 11.5–15.5)
RDW: 14.6 % (ref 11.5–15.5)
WBC: 6.7 10*3/uL (ref 4.0–10.5)
WBC: 7.8 10*3/uL (ref 4.0–10.5)

## 2015-02-17 LAB — BASIC METABOLIC PANEL
ANION GAP: 4 — AB (ref 5–15)
BUN: 8 mg/dL (ref 6–23)
CO2: 27 mmol/L (ref 19–32)
Calcium: 8.3 mg/dL — ABNORMAL LOW (ref 8.4–10.5)
Chloride: 108 mmol/L (ref 96–112)
Creatinine, Ser: 0.92 mg/dL (ref 0.50–1.10)
GFR calc Af Amer: 68 mL/min — ABNORMAL LOW (ref >90)
GFR calc non Af Amer: 59 mL/min — ABNORMAL LOW (ref >90)
Glucose, Bld: 90 mg/dL (ref 70–99)
Potassium: 3.8 mmol/L (ref 3.5–5.1)
Sodium: 139 mmol/L (ref 135–145)

## 2015-02-17 NOTE — Plan of Care (Signed)
Problem: Phase III Progression Outcomes Goal: Activity at appropriate level-compared to baseline (UP IN CHAIR FOR HEMODIALYSIS)  Outcome: Progressing Up with assistance. C/o dizziness when up to BR this am.

## 2015-02-17 NOTE — Progress Notes (Signed)
2 Days Post-Op  Subjective: Having loose BMs.  Felt a little light-headed when she got up.  Objective: Vital signs in last 24 hours: Temp:  [98.1 F (36.7 C)-98.5 F (36.9 C)] 98.5 F (36.9 C) (03/06 0500) Pulse Rate:  [64-74] 74 (03/06 0500) Resp:  [18-20] 20 (03/06 0500) BP: (122-158)/(55-59) 158/59 mmHg (03/06 0500) SpO2:  [93 %-95 %] 95 % (03/06 0500) Weight:  [172 lb 3.2 oz (78.109 kg)] 172 lb 3.2 oz (78.109 kg) (03/06 0500) Last BM Date: 02/17/15  Intake/Output from previous day: 03/05 0701 - 03/06 0700 In: 1950.5 [P.O.:480; I.V.:1470.5] Out: 2150 [Urine:2150] Intake/Output this shift: Total I/O In: -  Out: 200 [Urine:200]  PE: General- Awake, in NAD. Abdomen- Soft, tender around incision sites. Dressings clean and intact. Active bowel sounds.  Lab Results:   Recent Labs  02/16/15 0513 02/17/15 0455  WBC 10.2 6.7  HGB 8.4* 7.6*  HCT 25.1* 22.8*  PLT 232 200   BMET  Recent Labs  02/16/15 0513 02/17/15 0455  NA 136 139  K 3.4* 3.8  CL 105 108  CO2 26 27  GLUCOSE 98 90  BUN 11 8  CREATININE 0.90 0.92  CALCIUM 8.3* 8.3*   PT/INR No results for input(s): LABPROT, INR in the last 72 hours. Comprehensive Metabolic Panel:    Component Value Date/Time   NA 139 02/17/2015 0455   NA 136 02/16/2015 0513   K 3.8 02/17/2015 0455   K 3.4* 02/16/2015 0513   CL 108 02/17/2015 0455   CL 105 02/16/2015 0513   CO2 27 02/17/2015 0455   CO2 26 02/16/2015 0513   BUN 8 02/17/2015 0455   BUN 11 02/16/2015 0513   CREATININE 0.92 02/17/2015 0455   CREATININE 0.90 02/16/2015 0513   GLUCOSE 90 02/17/2015 0455   GLUCOSE 98 02/16/2015 0513   CALCIUM 8.3* 02/17/2015 0455   CALCIUM 8.3* 02/16/2015 0513   AST 24 03/11/2009 0450   AST 13 11/27/2008 0003   ALT 15 03/11/2009 0450   ALT 11 11/27/2008 0003   ALKPHOS 74 03/11/2009 0450   ALKPHOS 84 11/27/2008 0003   BILITOT 0.6 03/11/2009 0450   BILITOT 0.6 11/27/2008 0003   PROT 6.7 03/11/2009 0450   PROT 6.1  11/27/2008 0003   ALBUMIN 3.5 03/11/2009 0450   ALBUMIN 3.4* 11/27/2008 0003     Studies/Results: No results found.  Anti-infectives: Anti-infectives    Start     Dose/Rate Route Frequency Ordered Stop   02/15/15 1107  clindamycin (CLEOCIN) 900 mg, gentamicin (GARAMYCIN) 240 mg in sodium chloride 0.9 % 1,000 mL for intraperitoneal lavage  Status:  Discontinued       As needed 02/15/15 1107 02/15/15 1108   02/15/15 0600  clindamycin (CLEOCIN) 900 mg, gentamicin (GARAMYCIN) 240 mg in sodium chloride 0.9 % 1,000 mL for intraperitoneal lavage  Status:  Discontinued      Intraperitoneal On call to O.R. 02/14/15 1528 02/15/15 1148   02/15/15 0600  clindamycin (CLEOCIN) IVPB 900 mg     900 mg 100 mL/hr over 30 Minutes Intravenous On call to O.R. 02/14/15 1530 02/15/15 0810   02/15/15 0600  gentamicin (GARAMYCIN) 310 mg in dextrose 5 % 100 mL IVPB     5 mg/kg  62.6 kg (Adjusted) 107.8 mL/hr over 60 Minutes Intravenous On call to O.R. 02/14/15 1530 02/15/15 0840   02/14/15 1530  clindamycin (CLEOCIN) 900 mg, gentamicin (GARAMYCIN) 240 mg in sodium chloride 0.9 % 1,000 mL for intraperitoneal lavage  Status:  Discontinued  Comments:  Pharmacy may adjust dosing strength, schedule, rate of infusion, etc as needed to optimize therapy   1 application Intraperitoneal To Surgery 02/14/15 1525 02/14/15 1528      Assessment Principal Problem:   Cancer of ascending colon s/p robotic colectomy 02/15/2015-continued ABL anemia Active Problems:   Hypokalemia-corrected     LOS: 2 days   Plan: Continue  full liquid diet. Check hemoglobin this afternoon and tomorrow.  Mylisa Brunson J PA-S 02/17/2015

## 2015-02-18 LAB — CBC
HEMATOCRIT: 23.3 % — AB (ref 36.0–46.0)
Hemoglobin: 7.8 g/dL — ABNORMAL LOW (ref 12.0–15.0)
MCH: 29.8 pg (ref 26.0–34.0)
MCHC: 33.5 g/dL (ref 30.0–36.0)
MCV: 88.9 fL (ref 78.0–100.0)
Platelets: 201 10*3/uL (ref 150–400)
RBC: 2.62 MIL/uL — ABNORMAL LOW (ref 3.87–5.11)
RDW: 14.6 % (ref 11.5–15.5)
WBC: 6.4 10*3/uL (ref 4.0–10.5)

## 2015-02-18 MED ORDER — SODIUM CHLORIDE 0.9 % IV SOLN: 250.0000 mL | INTRAVENOUS | Status: DC | PRN

## 2015-02-18 MED ORDER — BISMUTH SUBSALICYLATE 262 MG/15ML PO SUSP
30.0000 mL | Freq: Three times a day (TID) | ORAL | Status: DC | PRN
  Filled 2015-02-18: qty 118

## 2015-02-18 MED ORDER — BISMUTH SUBSALICYLATE 262 MG/15ML PO SUSP
30.0000 mL | Freq: Two times a day (BID) | ORAL | Status: AC
  Administered 2015-02-18 (×2): 30 mL via ORAL
  Filled 2015-02-18: qty 118

## 2015-02-18 MED ORDER — OXYCODONE HCL 5 MG PO TABS
5.0000 mg | ORAL_TABLET | ORAL | Status: DC | PRN
  Administered 2015-02-18: 5 mg via ORAL
  Administered 2015-02-18 – 2015-02-19 (×2): 10 mg via ORAL
  Filled 2015-02-18: qty 1
  Filled 2015-02-18 (×2): qty 2

## 2015-02-18 MED ORDER — SODIUM CHLORIDE 0.9 % IJ SOLN
3.0000 mL | Freq: Two times a day (BID) | INTRAMUSCULAR | Status: DC
  Administered 2015-02-18 (×2): 3 mL via INTRAVENOUS

## 2015-02-18 MED ORDER — LACTATED RINGERS IV BOLUS (SEPSIS): 1000.0000 mL | Freq: Three times a day (TID) | INTRAVENOUS | Status: DC | PRN

## 2015-02-18 MED ORDER — FERROUS SULFATE 325 (65 FE) MG PO TABS
325.0000 mg | ORAL_TABLET | Freq: Three times a day (TID) | ORAL | Status: DC
  Administered 2015-02-18 – 2015-02-19 (×3): 325 mg via ORAL
  Filled 2015-02-18 (×6): qty 1

## 2015-02-18 MED ORDER — SODIUM CHLORIDE 0.9 % IJ SOLN: 3.0000 mL | INTRAMUSCULAR | Status: DC | PRN

## 2015-02-18 NOTE — Care Management Note (Signed)
    Page 1 of 1   02/18/2015     12:13:48 PM CARE MANAGEMENT NOTE 02/18/2015  Patient:  Erica Escobar, Erica Escobar   Account Number:  [id number]  Date Initiated:  02/18/2015  Documentation initiated by:  Sunday Spillers  Subjective/Objective Assessment:   79 yo female admitted s/p colectomy. PTA lived at home with spouse.     Action/Plan:   Home when stable   Anticipated DC Date:  02/19/2015   Anticipated DC Plan:  [place]  CM consult      Choice offered to / List presented to:             Status of service:  Completed, signed off Medicare Important Message given?  YES (If response is "NO", the following Medicare IM given date fields will be blank) Date Medicare IM given:  02/18/2015 Medicare IM given by:  [facility] Date Additional Medicare IM given:   Additional Medicare IM given by:    Discharge Disposition:  HOME/SELF CARE  Per UR Regulation:  Reviewed for med. necessity/level of care/duration of stay  If discussed at [place] of Stay Meetings, dates discussed:    Comments:

## 2015-02-18 NOTE — Progress Notes (Addendum)
[address] [place], [place] [address] Phone: [phone] FAX: [place] [phone] 03/29/1936  CARE TEAM:  PCP: Horatio Pel, MD  Outpatient Care Team: Patient Care Team: Horatio Pel, MD as PCP - General (Internal Medicine) Michael Boston, MD as Consulting Physician (General Surgery) Beryle Beams, MD as Consulting Physician (Gastroenterology) Jacolyn Reedy, MD as Consulting Physician (Cardiology)  Inpatient Treatment Team: Treatment Team: Attending Provider: Michael Boston, MD; Technician: Joaquin Music, NT; Registered Nurse: Kristopher Oppenheim, RN; Technician: Rudi Heap, NT  Problem List:   Principal Problem:   Cancer of ascending colon s/p robotic colectomy 02/15/2015 Active Problems:   Restless leg syndrome   Depression   Insomnia   COPD (chronic obstructive pulmonary disease)   GERD (gastroesophageal reflux disease)   3 Days Post-Op    POST-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  Add back iron.  Stop ASA -follow off IVF.  Let her auto-diurese.  Hold on Lasix for now.  Check lytes -f/u pathology -VTE prophylaxis- SCDs, etc -mobilize as tolerated to help recovery.  PT/OT evals.  Claims she has home helper for her husband & may use her as well.  [name], M.D., F.A.C.S. Gastrointestinal and Minimally Invasive Surgery [facility] [address] [place],  [address] [phone] Main / Paging   02/18/2015  Subjective:  Tired but better Walking in room Using bathroom  Objective:  Vital  signs:  Filed Vitals:   02/17/15 1257 02/17/15 1823 02/17/15 2152 02/18/15 0607  BP: 148/66 137/54 138/68 140/80  Pulse: 73 79 82 79  Temp: 98.2 F (36.8 C)  98.8 F (37.1 C) 98.3 F (36.8 C)  TempSrc: Oral  Oral Oral  Resp: 18   18  Height:      Weight:      SpO2: 97%  98% 96%    Last BM Date: 02/18/15  Intake/Output   Yesterday:  03/06 0701 - 03/07 0700 In: 2763 [P.O.:960; I.V.:1803] Out: 2875 [Urine:2875] This shift:     Bowel function:  Flatus: y  BM: many loose  Drain: n/a  Physical Exam:  General: Pt awake/alert/oriented x4 in no acute distress Eyes: PERRL, normal EOM.  Sclera clear.  No icterus Neuro: CN II-XII intact w/o focal sensory/motor deficits. Lymph: No head/neck/groin lymphadenopathy Psych:  No delerium/psychosis/paranoia HENT: Normocephalic, Mucus membranes moist.  No thrush Neck: Supple, No tracheal deviation Chest: No chest wall pain w good excursion CV:  Pulses intact.  Regular rhythm MS: Normal AROM mjr joints.  No obvious deformity Abdomen: Soft.  Nondistended.  Mildly tender at incisions only.  No evidence of peritonitis.  No incarcerated hernias. Ext:  SCDs BLE.  No mjr edema.  No cyanosis Skin: No petechiae / purpura  Results:   Labs: Results for orders placed or performed during the hospital encounter of 02/15/15 (from the past 48 hour(s))  CBC     Status: Abnormal   Collection Time: 02/17/15  4:55 AM  Result Value Ref Range   WBC 6.7 4.0 - 10.5 K/uL   RBC 2.55 (L) 3.87 - 5.11 MIL/uL  Hemoglobin 7.6 (L) 12.0 - 15.0 g/dL   HCT 22.8 (L) 36.0 - 46.0 %   MCV 89.4 78.0 - 100.0 fL   MCH 29.8 26.0 - 34.0 pg   MCHC 33.3 30.0 - 36.0 g/dL   RDW 14.8 11.5 - 15.5 %   Platelets 200 150 - 400 K/uL  Basic metabolic panel     Status: Abnormal   Collection Time: 02/17/15  4:55 AM  Result Value Ref Range   Sodium 139 135 - 145 mmol/L   Potassium 3.8 3.5 - 5.1 mmol/L   Chloride 108 96 - 112 mmol/L   CO2 27 19 - 32 mmol/L   Glucose, Bld  90 70 - 99 mg/dL   BUN 8 6 - 23 mg/dL   Creatinine, Ser 0.92 0.50 - 1.10 mg/dL   Calcium 8.3 (L) 8.4 - 10.5 mg/dL   GFR calc non Af Amer 59 (L) >90 mL/min   GFR calc Af Amer 68 (L) >90 mL/min    Comment: (NOTE) The eGFR has been calculated using the CKD EPI equation. This calculation has not been validated in all clinical situations. eGFR's persistently <90 mL/min signify possible Chronic Kidney Disease.    Anion gap 4 (L) 5 - 15  CBC     Status: Abnormal   Collection Time: 02/17/15  3:15 PM  Result Value Ref Range   WBC 7.8 4.0 - 10.5 K/uL   RBC 2.85 (L) 3.87 - 5.11 MIL/uL   Hemoglobin 8.7 (L) 12.0 - 15.0 g/dL   HCT 25.8 (L) 36.0 - 46.0 %   MCV 90.5 78.0 - 100.0 fL   MCH 30.5 26.0 - 34.0 pg   MCHC 33.7 30.0 - 36.0 g/dL   RDW 14.6 11.5 - 15.5 %   Platelets 230 150 - 400 K/uL  CBC     Status: Abnormal   Collection Time: 02/18/15  4:34 AM  Result Value Ref Range   WBC 6.4 4.0 - 10.5 K/uL   RBC 2.62 (L) 3.87 - 5.11 MIL/uL   Hemoglobin 7.8 (L) 12.0 - 15.0 g/dL   HCT 23.3 (L) 36.0 - 46.0 %   MCV 88.9 78.0 - 100.0 fL   MCH 29.8 26.0 - 34.0 pg   MCHC 33.5 30.0 - 36.0 g/dL   RDW 14.6 11.5 - 15.5 %   Platelets 201 150 - 400 K/uL    Imaging / Studies: No results found.  Medications / Allergies: per chart  Antibiotics: Anti-infectives    Start     Dose/Rate Route Frequency Ordered Stop   02/15/15 1107  clindamycin (CLEOCIN) 900 mg, gentamicin (GARAMYCIN) 240 mg in sodium chloride 0.9 % 1,000 mL for intraperitoneal lavage  Status:  Discontinued       As needed 02/15/15 1107 02/15/15 1108   02/15/15 0600  clindamycin (CLEOCIN) 900 mg, gentamicin (GARAMYCIN) 240 mg in sodium chloride 0.9 % 1,000 mL for intraperitoneal lavage  Status:  Discontinued      Intraperitoneal On call to O.R. 02/14/15 1528 02/15/15 1148   02/15/15 0600  clindamycin (CLEOCIN) IVPB 900 mg     900 mg 100 mL/hr over 30 Minutes Intravenous On call to O.R. 02/14/15 1530 02/15/15 0810   02/15/15 0600   gentamicin (GARAMYCIN) 310 mg in dextrose 5 % 100 mL IVPB     5 mg/kg  62.6 kg (Adjusted) 107.8 mL/hr over 60 Minutes Intravenous On call to O.R. 02/14/15 1530 02/15/15 0840   02/14/15 1530  clindamycin (CLEOCIN) 900 mg, gentamicin (GARAMYCIN) 240 mg in  sodium chloride 0.9 % 1,000 mL for intraperitoneal lavage  Status:  Discontinued    Comments:  Pharmacy may adjust dosing strength, schedule, rate of infusion, etc as needed to optimize therapy   1 application Intraperitoneal To Surgery 02/14/15 1525 02/14/15 1528       Note: Portions of this report may have been transcribed using voice recognition software. Every effort was made to ensure accuracy; however, inadvertent computerized transcription errors may be present.   Any transcriptional errors that result from this process are unintentional.     [name], M.D., F.A.C.S. Gastrointestinal and Minimally Invasive Surgery [facility] [address], [place], [place] [address] [phone] Main / Paging   02/18/2015

## 2015-02-19 LAB — CBC
HEMATOCRIT: 25.8 % — AB (ref 36.0–46.0)
HEMOGLOBIN: 8.6 g/dL — AB (ref 12.0–15.0)
MCH: 29.8 pg (ref 26.0–34.0)
MCHC: 33.3 g/dL (ref 30.0–36.0)
MCV: 89.3 fL (ref 78.0–100.0)
Platelets: 291 10*3/uL (ref 150–400)
RBC: 2.89 MIL/uL — ABNORMAL LOW (ref 3.87–5.11)
RDW: 14.5 % (ref 11.5–15.5)
WBC: 5.9 10*3/uL (ref 4.0–10.5)

## 2015-02-19 LAB — BASIC METABOLIC PANEL
Anion gap: 8 (ref 5–15)
BUN: 8 mg/dL (ref 6–23)
CHLORIDE: 104 mmol/L (ref 96–112)
CO2: 29 mmol/L (ref 19–32)
Calcium: 8.6 mg/dL (ref 8.4–10.5)
Creatinine, Ser: 0.89 mg/dL (ref 0.50–1.10)
GFR calc non Af Amer: 61 mL/min — ABNORMAL LOW (ref >90)
GFR, EST AFRICAN AMERICAN: 71 mL/min — AB (ref >90)
Glucose, Bld: 89 mg/dL (ref 70–99)
POTASSIUM: 3.9 mmol/L (ref 3.5–5.1)
SODIUM: 141 mmol/L (ref 135–145)

## 2015-02-19 NOTE — Progress Notes (Signed)
Discharge instructions reviewed with patient, patient is alert and oriented, incisions are within normal limits, patient to follow up with MD, patient tolerating diet without complaints, questions and concerns answered Neta Mends RN

## 2015-02-19 NOTE — Evaluation (Signed)
Physical Therapy One Time Evaluation Patient Details Name: LANIYAH ROSENWALD MRN: [id number] DOB: 1936-11-30 Today's Date: 02/19/2015   History of Present Illness  Pt is a 79 year old female admitted for cancer of ascending colon s/p robotic colectomy3/03/2015 with PMHx of restless legs, neuropathy, COPD, GERD  Clinical Impression  Patient evaluated by Physical Therapy with no further acute PT needs identified. All education has been completed and the patient has no further questions.  Pt ambulating in hallway without assistive device, slow pace however steady, no LOB.  Pt reports she hired caregiver to assist with spouse as well as perform household chores for approx a week while she continues to recover.  No follow-up Physical Therapy or equipment needs.  Pt has multiple walkers and agreeable to use if she feels the need.  PT is signing off. Thank you for this referral.     Follow Up Recommendations No PT follow up    Equipment Recommendations  None recommended by PT    Recommendations for Other Services       Precautions / Restrictions Precautions Precautions: None Restrictions Weight Bearing Restrictions: No      Mobility  Bed Mobility Overal bed mobility: Modified Independent                Transfers Overall transfer level: Modified independent                  Ambulation/Gait Ambulation/Gait assistance: Supervision;Modified independent (Device/Increase time) Ambulation Distance (Feet): 400 Feet   Gait Pattern/deviations: Step-through pattern;Decreased stride length Gait velocity: decr   General Gait Details: decreased arm swing as pt maintained hands on upper thighs during gait, also slow pace however steady, no LOB  Stairs            Wheelchair Mobility    Modified Rankin (Stroke Patients Only)       Balance Overall balance assessment: No apparent balance deficits (not formally assessed)                                            Pertinent Vitals/Pain Pain Assessment: 0-10 Pain Score: 2  Pain Location: abdominal surgical site Pain Descriptors / Indicators: Sore Pain Intervention(s): Limited activity within patient's tolerance;Monitored during session    Home Living Family/patient expects to be discharged to:: Private residence Living Arrangements: Spouse/significant other Available Help at Discharge: Personal care attendant (for a week, days) Type of Home: House Home Access: Stairs to enter;Ramped entrance     Home Layout: One level Home Equipment: Environmental consultant - 2 wheels;Cane - single point      Prior Function Level of Independence: Independent               Hand Dominance        Extremity/Trunk Assessment               Lower Extremity Assessment: Overall WFL for tasks assessed         Communication   Communication: No difficulties  Cognition Arousal/Alertness: Awake/alert Behavior During Therapy: WFL for tasks assessed/performed Overall Cognitive Status: Within Functional Limits for tasks assessed                      General Comments      Exercises        Assessment/Plan    PT Assessment Patent does not need any further PT services  PT Diagnosis     PT Problem List    PT Treatment Interventions     PT Goals (Current goals can be found in the Care Plan section) Acute Rehab PT Goals PT Goal Formulation: All assessment and education complete, DC therapy    Frequency     Barriers to discharge        Co-evaluation               End of Session   Activity Tolerance: Patient tolerated treatment well Patient left: in bed;with call bell/phone within reach           Time: 0855-0907 PT Time Calculation (min) (ACUTE ONLY): 12 min   Charges:   PT Evaluation $Initial PT Evaluation Tier I: 1 Procedure     PT G Codes:        Yaelis Scharfenberg,KATHrine E 02/19/2015, 9:11 AM Carmelia Bake, PT, DPT 02/19/2015 Pager: [phone]

## 2015-02-19 NOTE — Discharge Summary (Signed)
Physician Discharge Summary  Patient ID: Erica Escobar MRN: [id number] DOB/AGE: November 21, 1936 79 y.o.  Admit date: 02/15/2015 Discharge date: 02/19/2015  Patient Care Team: Deland Pretty, MD as PCP - General (Internal Medicine) Michael Boston, MD as Consulting Physician (General Surgery) Carol Ada, MD as Consulting Physician (Gastroenterology) Jacolyn Reedy, MD as Consulting Physician (Cardiology)  Admission Diagnoses: Principal Problem:   Cancer of ascending colon s/p robotic colectomy 02/15/2015 Active Problems:   Restless leg syndrome   Depression   Insomnia   COPD (chronic obstructive pulmonary disease)   GERD (gastroesophageal reflux disease)   Discharge Diagnoses:  Principal Problem:   Cancer of ascending colon s/p robotic colectomy 02/15/2015 Active Problems:   Restless leg syndrome   Depression   Insomnia   COPD (chronic obstructive pulmonary disease)   GERD (gastroesophageal reflux disease)   POST-OPERATIVE DIAGNOSIS: Cancer of Hepatic Flexure of Colon  PROCEDURE: Procedure(s): XI ROBOT ASSISTED PROXIMAL RIGHT COLECTOMY  SURGEON: Surgeon(s): Michael Boston, MD Leighton Ruff, MD - Assist   PATHOLOGY PENDING  Consults: None  Hospital Course:   The patient underwent the surgery above.  Postoperatively, the patient gradually mobilized and advanced to a solid diet.  Pain and other symptoms were treated aggressively.  Early diarrhea improved with solid diet & bismuth x 2 doses.  By the time of discharge, the patient was walking well the hallways, eating food, having flatus.  Pain was well-controlled on an oral medications.  Based on meeting discharge criteria and continuing to recover, I felt it was safe for the patient to be discharged from the hospital to further recover with close followup. Postoperative recommendations were discussed in detail.  Patient had many appropriate questions - answered to her satisfaction.  They are written as well.  Follow up on  pathology results later this week   Significant Diagnostic Studies:  Results for orders placed or performed during the hospital encounter of 02/15/15 (from the past 72 hour(s))  CBC     Status: Abnormal   Collection Time: 02/17/15  4:55 AM  Result Value Ref Range   WBC 6.7 4.0 - 10.5 K/uL   RBC 2.55 (L) 3.87 - 5.11 MIL/uL   Hemoglobin 7.6 (L) 12.0 - 15.0 g/dL   HCT 22.8 (L) 36.0 - 46.0 %   MCV 89.4 78.0 - 100.0 fL   MCH 29.8 26.0 - 34.0 pg   MCHC 33.3 30.0 - 36.0 g/dL   RDW 14.8 11.5 - 15.5 %   Platelets 200 150 - 400 K/uL  Basic metabolic panel     Status: Abnormal   Collection Time: 02/17/15  4:55 AM  Result Value Ref Range   Sodium 139 135 - 145 mmol/L   Potassium 3.8 3.5 - 5.1 mmol/L   Chloride 108 96 - 112 mmol/L   CO2 27 19 - 32 mmol/L   Glucose, Bld 90 70 - 99 mg/dL   BUN 8 6 - 23 mg/dL   Creatinine, Ser 0.92 0.50 - 1.10 mg/dL   Calcium 8.3 (L) 8.4 - 10.5 mg/dL   GFR calc non Af Amer 59 (L) >90 mL/min   GFR calc Af Amer 68 (L) >90 mL/min    Comment: (NOTE) The eGFR has been calculated using the CKD EPI equation. This calculation has not been validated in all clinical situations. eGFR's persistently <90 mL/min signify possible Chronic Kidney Disease.    Anion gap 4 (L) 5 - 15  CBC     Status: Abnormal   Collection Time: 02/17/15  3:15 PM  Result Value Ref Range   WBC 7.8 4.0 - 10.5 K/uL   RBC 2.85 (L) 3.87 - 5.11 MIL/uL   Hemoglobin 8.7 (L) 12.0 - 15.0 g/dL   HCT 25.8 (L) 36.0 - 46.0 %   MCV 90.5 78.0 - 100.0 fL   MCH 30.5 26.0 - 34.0 pg   MCHC 33.7 30.0 - 36.0 g/dL   RDW 14.6 11.5 - 15.5 %   Platelets 230 150 - 400 K/uL  CBC     Status: Abnormal   Collection Time: 02/18/15  4:34 AM  Result Value Ref Range   WBC 6.4 4.0 - 10.5 K/uL   RBC 2.62 (L) 3.87 - 5.11 MIL/uL   Hemoglobin 7.8 (L) 12.0 - 15.0 g/dL   HCT 23.3 (L) 36.0 - 46.0 %   MCV 88.9 78.0 - 100.0 fL   MCH 29.8 26.0 - 34.0 pg   MCHC 33.5 30.0 - 36.0 g/dL   RDW 14.6 11.5 - 15.5 %   Platelets  201 150 - 400 K/uL  CBC     Status: Abnormal   Collection Time: 02/19/15  5:21 AM  Result Value Ref Range   WBC 5.9 4.0 - 10.5 K/uL   RBC 2.89 (L) 3.87 - 5.11 MIL/uL   Hemoglobin 8.6 (L) 12.0 - 15.0 g/dL   HCT 25.8 (L) 36.0 - 46.0 %   MCV 89.3 78.0 - 100.0 fL   MCH 29.8 26.0 - 34.0 pg   MCHC 33.3 30.0 - 36.0 g/dL   RDW 14.5 11.5 - 15.5 %   Platelets 291 150 - 400 K/uL    Comment: REPEATED TO VERIFY DELTA CHECK NOTED   Basic metabolic panel     Status: Abnormal   Collection Time: 02/19/15  5:21 AM  Result Value Ref Range   Sodium 141 135 - 145 mmol/L   Potassium 3.9 3.5 - 5.1 mmol/L   Chloride 104 96 - 112 mmol/L   CO2 29 19 - 32 mmol/L   Glucose, Bld 89 70 - 99 mg/dL   BUN 8 6 - 23 mg/dL   Creatinine, Ser 0.89 0.50 - 1.10 mg/dL   Calcium 8.6 8.4 - 10.5 mg/dL   GFR calc non Af Amer 61 (L) >90 mL/min   GFR calc Af Amer 71 (L) >90 mL/min    Comment: (NOTE) The eGFR has been calculated using the CKD EPI equation. This calculation has not been validated in all clinical situations. eGFR's persistently <90 mL/min signify possible Chronic Kidney Disease.    Anion gap 8 5 - 15    No results found.  Discharge Exam: Blood pressure 163/61, pulse 75, temperature 98.5 F (36.9 C), temperature source Oral, resp. rate 18, height _0  (1.6 m), weight 172 lb 3.2 oz (78.109 kg), SpO2 98 %.  General: Pt awake/alert/oriented x4 in no major acute distress Eyes: PERRL, normal EOM. Sclera nonicteric Neuro: CN II-XII intact w/o focal sensory/motor deficits. Lymph: No head/neck/groin lymphadenopathy Psych:  No delerium/psychosis/paranoia.  Inquisitive.  Calm. HENT: Normocephalic, Mucus membranes moist.  No thrush Neck: Supple, No tracheal deviation Chest: No pain.  Good respiratory excursion. CV:  Pulses intact.  Regular rhythm MS: Normal AROM mjr joints.  No obvious deformity Abdomen: Soft, Nondistended.  Incisions on abdomen well healed with normal healing ridges.  Nontender.  No  incarcerated hernias. Ext:  SCDs BLE.  No significant edema.  No cyanosis Skin: No petechiae / purpura  Discharged Condition: good   Past Medical History  Diagnosis Date  . PONV (postoperative  nausea and vomiting)   . Hypertension   . Keratosis, actinic   . Neuromuscular disorder     neuropathy legs,   . Restless leg syndrome   . Urinary incontinence     occ, stress  . History of hiatal hernia     dx. '87 Schatzki's ring  . Hyperlipidemia     good control  . Constipation, chronic   . On aspirin at home     chronic use  . Skin cancer     multiple, "tx. area right anterior wrist"  . Abnormal tympanic membrane     right ear and mastoid problems  . Diverticulosis   . Glaucoma     bilateral-sugery with laser, using eye drops  . Macular degeneration   . Arthritis     oseoarthritis  . Hemorrhoids     Internal  . Depression   . Insomnia     hx. of  . COPD (chronic obstructive pulmonary disease)     mild COPD  . Leg cramps   . GERD (gastroesophageal reflux disease)   . Schatzki's ring 1987  . Cancer of ascending colon 02/15/2015    Past Surgical History  Procedure Laterality Date  . Cataract extraction, bilateral Bilateral   . Colonoscopy    . Hemorrhoidectomy       x2  . Cardiac catheterization      last 12'09- negative  . Eye surgery      laser eye surgery  . Abdominal hysterectomy    . Tonsillectomy      History   Social History  . Marital Status: Married    Spouse Name: N/A  . Number of Children: N/A  . Years of Education: N/A   Occupational History  . Not on file.   Social History Main Topics  . Smoking status: Former Research scientist (life sciences)  . Smokeless tobacco: Not on file     Comment: short time x1 - during pregnancy  . Alcohol Use: No  . Drug Use: No  . Sexual Activity: Not Currently   Other Topics Concern  . Not on file   Social History Narrative    History reviewed. No pertinent family history.  Current Facility-Administered Medications   Medication Dose Route Frequency Provider Last Rate Last Dose  . 0.9 %  sodium chloride infusion  250 mL Intravenous PRN Michael Boston, MD      . 0.9 %  sodium chloride infusion  250 mL Intravenous PRN Michael Boston, MD      . acetaminophen (TYLENOL) tablet 1,000 mg  1,000 mg Oral TID Michael Boston, MD   1,000 mg at 02/18/15 2103  . alum & mag hydroxide-simeth (MAALOX/MYLANTA) 200-200-20 MG/5ML suspension 30 mL  30 mL Oral Q6H PRN Michael Boston, MD      . amLODipine (NORVASC) tablet 5 mg  5 mg Oral q morning - 10a Michael Boston, MD   5 mg at 02/18/15 0947  . beta carotene w/minerals (OCUVITE) tablet 1 tablet  1 tablet Oral BID Michael Boston, MD   1 tablet at 02/18/15 2103  . bismuth subsalicylate (PEPTO BISMOL) 262 MG/15ML suspension 30 mL  30 mL Oral Q8H PRN Michael Boston, MD      . calcium-vitamin D (OSCAL WITH D) 500-200 MG-UNIT per tablet 1 tablet  1 tablet Oral q morning - 10a Michael Boston, MD   1 tablet at 02/18/15 [phone]  . diphenhydrAMINE (BENADRYL) 12.5 MG/5ML elixir 12.5 mg  12.5 mg Oral Q6H PRN Michael Boston, MD  Or  . diphenhydrAMINE (BENADRYL) injection 12.5 mg  12.5 mg Intravenous Q6H PRN Michael Boston, MD      . ferrous sulfate tablet 325 mg  325 mg Oral TID WC Michael Boston, MD   325 mg at 02/18/15 1644  . gemfibrozil (LOPID) tablet 600 mg  600 mg Oral QAC breakfast Michael Boston, MD   600 mg at 02/18/15 0814  . HYDROmorphone (DILAUDID) injection 0.5-2 mg  0.5-2 mg Intravenous Q1H PRN Michael Boston, MD   0.5 mg at 02/18/15 0353  . lactated ringers bolus 1,000 mL  1,000 mL Intravenous Q8H PRN Michael Boston, MD      . lip balm (CARMEX) ointment 1 application  1 application Topical BID Michael Boston, MD   1 application at 74/08/14 2200  . loratadine (CLARITIN) tablet 10 mg  10 mg Oral Daily PRN Michael Boston, MD      . magic mouthwash  15 mL Oral QID PRN Michael Boston, MD      . menthol-cetylpyridinium (CEPACOL) lozenge 3 mg  1 lozenge Oral PRN Michael Boston, MD      . multivitamin with minerals  tablet 1 tablet  1 tablet Oral q morning - 10a Michael Boston, MD   1 tablet at 02/18/15 [phone]  . nortriptyline (PAMELOR) capsule 20-40 mg  20-40 mg Oral QHS Michael Boston, MD   20 mg at 02/17/15 2105  . omega-3 acid ethyl esters (LOVAZA) capsule 1 g  1 g Oral Daily Michael Boston, MD   1 g at 02/18/15 0947  . ondansetron (ZOFRAN) tablet 4 mg  4 mg Oral Q6H PRN Michael Boston, MD       Or  . ondansetron [facility]) injection 4 mg  4 mg Intravenous Q6H PRN Michael Boston, MD   4 mg at 02/17/15 1211  . oxyCODONE (Oxy IR/ROXICODONE) immediate release tablet 5-10 mg  5-10 mg Oral Q4H PRN Michael Boston, MD   10 mg at 02/19/15 0604  . pantoprazole (PROTONIX) EC tablet 40 mg  40 mg Oral Daily Michael Boston, MD   40 mg at 02/18/15 0947  . phenol (CHLORASEPTIC) mouth spray 2 spray  2 spray Mouth/Throat PRN Michael Boston, MD      . promethazine (PHENERGAN) injection 6.25-12.5 mg  6.25-12.5 mg Intravenous Q4H PRN Michael Boston, MD      . rOPINIRole (REQUIP) tablet 0.5 mg  0.5 mg Oral Q2000 Michael Boston, MD   0.5 mg at 02/18/15 2103  . saccharomyces boulardii (FLORASTOR) capsule 250 mg  250 mg Oral BID Michael Boston, MD   250 mg at 02/18/15 2103  . sodium chloride 0.9 % injection 3 mL  3 mL Intravenous Q12H Michael Boston, MD   3 mL at 02/17/15 2200  . sodium chloride 0.9 % injection 3 mL  3 mL Intravenous PRN Michael Boston, MD   3 mL at 02/16/15 1034  . sodium chloride 0.9 % injection 3 mL  3 mL Intravenous Q12H Michael Boston, MD   3 mL at 02/18/15 2104  . sodium chloride 0.9 % injection 3 mL  3 mL Intravenous PRN Michael Boston, MD      . vitamin C (ASCORBIC ACID) tablet 500 mg  500 mg Oral Daily Michael Boston, MD   500 mg at 02/18/15 0947  . zolpidem (AMBIEN) tablet 5 mg  5 mg Oral QHS PRN Michael Boston, MD         Allergies  Allergen Reactions  . Codeine Itching  . Gabapentin Swelling    Throat  and leg swelling  . Oxybutynin Other (See Comments)    "made me go crazy"-- had crazy dreams.   . Penicillins Itching  .  Pentazocine Itching and Nausea Only    Headache.   . Tizanidine Other (See Comments)    "made me crazy"  . Vicodin [Hydrocodone-Acetaminophen] Other (See Comments)    "made me go crazy"    Disposition:   Discharge Instructions    Call MD for:  extreme fatigue    Complete by:  As directed      Call MD for:  extreme fatigue    Complete by:  As directed      Call MD for:  hives    Complete by:  As directed      Call MD for:  hives    Complete by:  As directed      Call MD for:  persistant nausea and vomiting    Complete by:  As directed      Call MD for:  persistant nausea and vomiting    Complete by:  As directed      Call MD for:  redness, tenderness, or signs of infection (pain, swelling, redness, odor or green/yellow discharge around incision site)    Complete by:  As directed      Call MD for:  redness, tenderness, or signs of infection (pain, swelling, redness, odor or green/yellow discharge around incision site)    Complete by:  As directed      Call MD for:  severe uncontrolled pain    Complete by:  As directed      Call MD for:  severe uncontrolled pain    Complete by:  As directed      Call MD for:    Complete by:  As directed   Temperature > 101.59F     Call MD for:    Complete by:  As directed   Temperature > 101.59F     Diet - low sodium heart healthy    Complete by:  As directed      Diet - low sodium heart healthy    Complete by:  As directed      Discharge instructions    Complete by:  As directed   Please see discharge instruction sheets.  Also refer to handout given an office.  Please call our office if you have any questions or concerns [phone]     Discharge instructions    Complete by:  As directed   Please see discharge instruction sheets.  Also refer to handout given an office.  Please call our office if you have any questions or concerns [phone]     Discharge wound care:    Complete by:  As directed   If you have closed incisions, shower  and bathe over these incisions with soap and water every day.  Remove all surgical dressings on postoperative day #3.  You do not need to replace dressings over the closed incisions unless you feel more comfortable with a Band-Aid covering it.   If you have an open wound that requires packing, please see wound care instructions.  In general, remove all dressings, wash wound with soap and water and then replace with saline moistened gauze.  Do the dressing change at least every day.  Please call our office [phone] if you have further questions.     Discharge wound care:    Complete by:  As directed   If you have closed incisions, shower  and bathe over these incisions with soap and water every day.  Remove all surgical dressings on postoperative day #3.  You do not need to replace dressings over the closed incisions unless you feel more comfortable with a Band-Aid covering it.   If you have an open wound that requires packing, please see wound care instructions.  In general, remove all dressings, wash wound with soap and water and then replace with saline moistened gauze.  Do the dressing change at least every day.  Please call our office [phone] if you have further questions.     Driving Restrictions    Complete by:  As directed   No driving until off narcotics and can safely swerve away without pain during an emergency     Driving Restrictions    Complete by:  As directed   No driving until off narcotics and can safely swerve away without pain during an emergency     Increase activity slowly    Complete by:  As directed   Walk an hour a day.  Use 20-30 minute walks.  When you can walk 30 minutes without difficulty, increase to low impact/moderate activities such as biking, jogging, swimming, sexual activity..  Eventually can increase to unrestricted activity when not feeling pain.  If you feel pain: STOP!Marland Kitchen   Let pain protect you from overdoing it.  Use ice/heat/over-the-counter pain  medications to help minimize his soreness.  Use pain prescriptions as needed to remain active.  It is better to take extra pain medications and be more active than to stay bedridden to avoid all pain medications.     Increase activity slowly    Complete by:  As directed   Walk an hour a day.  Use 20-30 minute walks.  When you can walk 30 minutes without difficulty, increase to low impact/moderate activities such as biking, jogging, swimming, sexual activity..  Eventually can increase to unrestricted activity when not feeling pain.  If you feel pain: STOP!Marland Kitchen   Let pain protect you from overdoing it.  Use ice/heat/over-the-counter pain medications to help minimize his soreness.  Use pain prescriptions as needed to remain active.  It is better to take extra pain medications and be more active than to stay bedridden to avoid all pain medications.     Lifting restrictions    Complete by:  As directed   Avoid heavy lifting initially.  Do not push through pain.  You have no specific weight limit.  Coughing and sneezing or four more stressful to your incision than any lifting you will do. Pain will protect you from injury.  Therefore, avoid intense activity until off all narcotic pain medications.  Coughing and sneezing or four more stressful to your incision than any lifting he will do.     Lifting restrictions    Complete by:  As directed   Avoid heavy lifting initially.  Do not push through pain.  You have no specific weight limit.  Coughing and sneezing or four more stressful to your incision than any lifting you will do. Pain will protect you from injury.  Therefore, avoid intense activity until off all narcotic pain medications.  Coughing and sneezing or four more stressful to your incision than any lifting he will do.     May shower / Bathe    Complete by:  As directed      May shower / Bathe    Complete by:  As directed      May walk up steps  Complete by:  As directed      May walk up steps     Complete by:  As directed      Sexual Activity Restrictions    Complete by:  As directed   Sexual activity as tolerated.  Do not push through pain.  Pain will protect you from injury.     Sexual Activity Restrictions    Complete by:  As directed   Sexual activity as tolerated.  Do not push through pain.  Pain will protect you from injury.     Walk with assistance    Complete by:  As directed   Walk over an hour a day.  May use a walker/cane/companion to help with balance and stamina.     Walk with assistance    Complete by:  As directed   Walk over an hour a day.  May use a walker/cane/companion to help with balance and stamina.            Medication List    STOP taking these medications        bisacodyl 5 MG EC tablet  Commonly known as:  DULCOLAX     metroNIDAZOLE 500 MG tablet  Commonly known as:  FLAGYL     neomycin 500 MG tablet  Commonly known as:  MYCIFRADIN     OXcarbazepine 150 MG tablet  Commonly known as:  TRILEPTAL     pantoprazole 40 MG tablet  Commonly known as:  PROTONIX      TAKE these medications        acetaminophen 325 MG tablet  Commonly known as:  TYLENOL  Take 650 mg by mouth every 6 (six) hours as needed for moderate pain or headache. Equate     amLODipine 5 MG tablet  Commonly known as:  NORVASC  Take 5 mg by mouth every morning.     aspirin EC 81 MG tablet  Take 81 mg by mouth every morning.     beta carotene w/minerals tablet  Take 1 tablet by mouth 2 (two) times daily.     calcium-vitamin D 250-125 MG-UNIT per tablet  Commonly known as:  OSCAL  Take 1 tablet by mouth every morning.     ferrous sulfate 325 (65 FE) MG tablet  Take 325 mg by mouth 3 (three) times daily with meals.     FISH OIL PO  Take 2 capsules by mouth every morning.     furosemide 40 MG tablet  Commonly known as:  LASIX  Take 20 mg by mouth every morning.     gemfibrozil 600 MG tablet  Commonly known as:  LOPID  Take 600 mg by mouth every morning.      loratadine 10 MG tablet  Commonly known as:  CLARITIN  Take 10 mg by mouth daily as needed for allergies.     multivitamin with minerals Tabs tablet  Take 1 tablet by mouth every morning.     naproxen sodium 220 MG tablet  Commonly known as:  ANAPROX  Take 440 mg by mouth every morning.     nortriptyline 10 MG capsule  Commonly known as:  PAMELOR  Take 20-40 mg by mouth at bedtime.     omeprazole 20 MG capsule  Commonly known as:  PRILOSEC  Take 20 mg by mouth every morning.     OVER THE COUNTER MEDICATION  Take 1 drop by mouth 2 (two) times daily. Smoothe II     polyethylene glycol packet  Commonly known  as:  MIRALAX / GLYCOLAX  Take 17 g by mouth at bedtime.     potassium chloride 10 MEQ tablet  Commonly known as:  K-DUR  Take 10 mEq by mouth every morning.     rOPINIRole 0.5 MG tablet  Commonly known as:  REQUIP  Take 0.5 mg by mouth at bedtime. 1-3 hours before bedtime.     traMADol 50 MG tablet  Commonly known as:  ULTRAM  Take 1-2 tablets (50-100 mg total) by mouth every 6 (six) hours as needed for moderate pain or severe pain.     VITAMIN B 12 PO  Take 1 tablet by mouth every morning.     vitamin C 500 MG tablet  Commonly known as:  ASCORBIC ACID  Take 500 mg by mouth daily. Will not take prior to surgery          Signed: Morton Peters, M.D., F.A.C.S. Gastrointestinal and Minimally Invasive Surgery [facility] [address] [place], [place] [address] [phone] Main / Paging   02/19/2015, 7:39 AM

## 2015-02-21 ENCOUNTER — Telehealth: Payer: Self-pay | Admitting: *Deleted

## 2015-02-21 NOTE — Telephone Encounter (Signed)
Noted referral request from Dr. Johney Maine for medical oncology. Scheduled for Dr. Benay Spice on 03/12/15 at 2:00 pm. Made her aware of [facility] parking, bring someone with her, start writing down questions. Also bring medication list. Patient had inquired about Dr. Marin Olp, but she does not want to drive to [facility], so she will see Dr. Benay Spice.

## 2015-02-22 ENCOUNTER — Telehealth: Payer: Self-pay | Admitting: Oncology

## 2015-02-22 NOTE — Telephone Encounter (Signed)
Chart delivered 02/22/15

## 2015-02-26 ENCOUNTER — Encounter (HOSPITAL_COMMUNITY): Payer: Self-pay

## 2015-02-27 ENCOUNTER — Encounter: Payer: Self-pay | Admitting: *Deleted

## 2015-02-27 NOTE — [facility] Oncology Navigator Note (Signed)
Patient presented at [facility] today. Needs BRAF and MSI testing on case #[id number]. Request sent to [facility] pathology department via email.

## 2015-03-04 ENCOUNTER — Encounter (HOSPITAL_COMMUNITY): Payer: Self-pay

## 2015-03-05 DIAGNOSIS — D649 Anemia, unspecified: Secondary | ICD-10-CM | POA: Diagnosis not present

## 2015-03-11 ENCOUNTER — Ambulatory Visit: Payer: Medicare Other | Admitting: Oncology

## 2015-03-12 ENCOUNTER — Ambulatory Visit (HOSPITAL_BASED_OUTPATIENT_CLINIC_OR_DEPARTMENT_OTHER): Payer: Medicare Other | Admitting: Oncology

## 2015-03-12 ENCOUNTER — Encounter: Payer: Self-pay | Admitting: Oncology

## 2015-03-12 ENCOUNTER — Telehealth: Payer: Self-pay | Admitting: Oncology

## 2015-03-12 ENCOUNTER — Other Ambulatory Visit (HOSPITAL_BASED_OUTPATIENT_CLINIC_OR_DEPARTMENT_OTHER): Payer: Medicare Other

## 2015-03-12 ENCOUNTER — Other Ambulatory Visit: Payer: Self-pay | Admitting: Oncology

## 2015-03-12 VITALS — BP 138/84 | HR 87 | Temp 98.5°F | Resp 18 | Ht 63.0 in | Wt 167.6 lb

## 2015-03-12 DIAGNOSIS — D649 Anemia, unspecified: Secondary | ICD-10-CM

## 2015-03-12 DIAGNOSIS — Z85038 Personal history of other malignant neoplasm of large intestine: Secondary | ICD-10-CM

## 2015-03-12 DIAGNOSIS — C182 Malignant neoplasm of ascending colon: Secondary | ICD-10-CM

## 2015-03-12 DIAGNOSIS — Z801 Family history of malignant neoplasm of trachea, bronchus and lung: Secondary | ICD-10-CM

## 2015-03-12 LAB — CBC WITH DIFFERENTIAL/PLATELET
BASO%: 0.5 % (ref 0.0–2.0)
BASOS ABS: 0 10*3/uL (ref 0.0–0.1)
EOS ABS: 0.4 10*3/uL (ref 0.0–0.5)
EOS%: 6.6 % (ref 0.0–7.0)
HEMATOCRIT: 31.1 % — AB (ref 34.8–46.6)
HGB: 10.2 g/dL — ABNORMAL LOW (ref 11.6–15.9)
LYMPH#: 1.4 10*3/uL (ref 0.9–3.3)
LYMPH%: 22.4 % (ref 14.0–49.7)
MCH: 29.2 pg (ref 25.1–34.0)
MCHC: 33 g/dL (ref 31.5–36.0)
MCV: 88.4 fL (ref 79.5–101.0)
MONO#: 0.6 10*3/uL (ref 0.1–0.9)
MONO%: 10.1 % (ref 0.0–14.0)
NEUT%: 60.4 % (ref 38.4–76.8)
NEUTROS ABS: 3.7 10*3/uL (ref 1.5–6.5)
PLATELETS: 328 10*3/uL (ref 145–400)
RBC: 3.51 10*6/uL — AB (ref 3.70–5.45)
RDW: 15.4 % — ABNORMAL HIGH (ref 11.2–14.5)
WBC: 6.2 10*3/uL (ref 3.9–10.3)

## 2015-03-12 LAB — CEA: CEA: 2.1 ng/mL (ref 0.0–5.0)

## 2015-03-12 NOTE — Progress Notes (Signed)
[facility] New Patient Consult   Referring MD: Ulani Degrasse 79 y.o.  08/14/1936    Reason for Referral: Colon cancer   HPI: She reports developing malaise beginning in the summer of 2015. She was referred to cardiology and noted to have anemia. She was referred to Dr. Benson Norway and underwent an upper endoscopy/colonoscopy on 01/01/2015. The endoscopy revealed a normal stomach, duodenum, and esophagus. A fungating mass was found in the hepatic flexure. No bleeding was present. The mass was biopsied. The area was tattooed. 2 sessile polyps were found in the ascending colon these were removed. The pathology revealed fragments of poorly differentiated adenocarcinoma at the ascending colon biopsy. The hepatic flexure mass returned as a poorly invasive adenocarcinoma. Immunohistochemical stains for mismatch repair proteins revealed absent MLH1 and PMS2.  She was referred to Dr. Johney Maine. A CT of the abdomen and pelvis on 01/01/2015 revealed a mass in the mid ascending colon. No adjacent lymphadenopathy. No lymphadenopathy in the abdomen or pelvis. A CT of the chest on 01/10/2015.  She was taken the operating room on 02/15/2015 for a robotic-assisted  right colectomy. A mass was noted proximal to the hepatic flexure. No metastatic disease was noted.  The pathology ([id number]) revealed invasive poorly depicted adenocarcinoma of the ascending colon. Macroscopic tumor perforation was not present. Tumor invaded through the muscularis propria. Lymphovascular invasion was present. No perineural invasion. No tumor deposits. The resection margins were negative. 19 lymph nodes were negative. The tumor returned MSI-high with loss of MLH1 and PMS2 expression. A BRAF mutation was detected.   Past Medical History  Diagnosis Date  . PONV (postoperative nausea and vomiting)   . Hypertension   . Keratosis, actinic   . Neuromuscular disorder     neuropathy legs,   . Restless leg  syndrome   . Urinary incontinence     occ, stress  . History of hiatal hernia     dx. '87 Schatzki's ring  . Hyperlipidemia     good control  . Constipation, chronic   .        Marland Kitchen Skin cancer-multiple squamous cell carcinomas        . Abnormal tympanic membrane     right ear and mastoid problems  . Diverticulosis   . Glaucoma     bilateral-sugery with laser, using eye drops  . Macular degeneration   . Arthritis     oseoarthritis  . Hemorrhoids     Internal  . Depression   . Insomnia     hx. of  . COPD (chronic obstructive pulmonary disease)     mild COPD  . Leg cramps   . GERD (gastroesophageal reflux disease)   . Schatzki's ring 1987  . Cancer of ascending colon (T3 N0)  02/15/2015    Past Surgical History  Procedure Laterality Date  . Cataract extraction, bilateral Bilateral   . Colonoscopy    . Hemorrhoidectomy       x2  . Cardiac catheterization      last 12'09- negative  . Eye surgery      laser eye surgery  . Abdominal hysterectomy    . Tonsillectomy      .   Cholecystectomy     Medications: Reviewed  Allergies:  Allergies  Allergen Reactions  . Codeine Itching  . Gabapentin Swelling    Throat and leg swelling  . Oxybutynin Other (See Comments)    "made me go crazy"-- had crazy dreams.   Marland Kitchen  Penicillins Itching  . Pentazocine Itching and Nausea Only    Headache.   . Tizanidine Other (See Comments)    "made me crazy"  . Vicodin [Hydrocodone-Acetaminophen] Other (See Comments)    "made me go crazy"    Family history: Her father had lymphoma. Her mother had "cancer ". A brother died of lung cancer at age 40 and was a smoker  Social History:   She lives with her husband in [place]. She is a Agricultural engineer. She does not use tobacco or alcohol. No transfusion history.  History  Alcohol Use No    History  Smoking status  . Former Smoker  Smokeless tobacco  . Not on file    Comment: short time x1 - during pregnancy      ROS:   Positives  include: Constipation, right abdominal pain prior to surgery, malaise prior to surgery  A complete ROS was otherwise negative.  Physical Exam:  Blood pressure 138/84, pulse 87, temperature 98.5 F (36.9 C), temperature source Oral, resp. rate 18, height _0  (1.6 m), weight 167 lb 9.6 oz (76.023 kg), SpO2 98 %.  HEENT: Neck without mass, oropharynx without visible mass Lungs: Clear bilaterally Cardiac: Regular rate and rhythm Abdomen: No hepatosplenomegaly, no mass, healed surgical incisions  Vascular: No leg edema Lymph nodes: No cervical, supraclavicular, axillary, or inguinal nodes Neurologic: Alert and oriented, the motor exam appears intact in the upper and lower extremities Skin: Multiple scars at the site of mole removal. Raised nodular lesion at the distal right arm Musculoskeletal: No spine tenderness   LAB:  CBC  Lab Results  Component Value Date   WBC 6.2 03/12/2015   HGB 10.2* 03/12/2015   HCT 31.1* 03/12/2015   MCV 88.4 03/12/2015   PLT 328 03/12/2015   NEUTROABS 3.7 03/12/2015     CMP      Component Value Date/Time   NA 141 02/19/2015 0521   K 3.9 02/19/2015 0521   CL 104 02/19/2015 0521   CO2 29 02/19/2015 0521   GLUCOSE 89 02/19/2015 0521   BUN 8 02/19/2015 0521   CREATININE 0.89 02/19/2015 0521   CALCIUM 8.6 02/19/2015 0521   PROT 6.7 03/11/2009 0450   ALBUMIN 3.5 03/11/2009 0450   AST 24 03/11/2009 0450   ALT 15 03/11/2009 0450   ALKPHOS 74 03/11/2009 0450   BILITOT 0.6 03/11/2009 0450   GFRNONAA 61* 02/19/2015 0521   GFRAA 71* 02/19/2015 0521    Lab Results  Component Value Date   CEA 8.9* 01/10/2015    Imaging: As per history of present illness, abdomen CT 01/01/2015 reviewed   Assessment/Plan:   1. Poorly differentiated adenocarcinoma of the ascending colon, stage II (T3 N0) , status post a right colectomy 02/15/2015  Loss of MLH1 and PMS2, MSI-high, BRAF mutation detected  2.  Anemia-likely secondary to bleeding from the  colon cancer and surgery, improved today 3.  Ascending colon "polyps "removed on the colonoscopy 12/22/2014 with the pathology revealing poorly to Richard adenocarcinoma and a sessile serrated adenoma polyp, similar to the pathology on the hepatic flexure mass 4.  History of multiple skin cancers 5.  Glaucoma   Disposition:   Ms. Hollar has been diagnosed with stage II colon cancer. I discussed the prognosis and reviewed the details of the surgical pathology report with her today. She has a good prognosis for a long-term disease-free survival. She appears to have a sporadic tumor with a BRAF mutation. The tumor is microsatellite instability high which may predict  for a decreased response to 5-fluorouracil chemotherapy.  I explained there is no clear role for adjuvant chemotherapy in patients with resected stage II colon cancer. The tumor has lymphovascular invasion as the only clear high-risk feature. I do not recommend adjuvant chemotherapy in her case. She is comfortable with observation.  She understands her family members are at increased risk for developing colon cancer. She will alert them of her diagnosis so they can receive appropriate screening. We discussed diet and exercise maneuvers that may decrease the risk of developing colon cancer.  Ms. Kaufhold will return for an office visit and CEA in 6 months. She will continue surveillance colonoscopies with Dr. Benson Norway.  Approximate 50 minutes were spent with patient today. The majority of the time was used for counseling and coordination of care.  [place], [place] 03/12/2015, 4:52 PM

## 2015-03-12 NOTE — Telephone Encounter (Signed)
Gave avs & calendar for September

## 2015-03-12 NOTE — [facility] Oncology Navigator Note (Signed)
Met with patient and daughter-in-law Vaughan Basta) during new patient visit. Explained the role of the GI Nurse Navigator and provided New Patient Packet with information on: 1.  Colon cancer 2. Support groups 3. Advanced Directives 4. Fall Safety Plan Answered questions, reviewed current treatment plan using TEACH back and provided emotional support. Provided copy of current treatment plan.  Merceda Elks, RN, BSN GI Oncology [facility]

## 2015-03-13 ENCOUNTER — Telehealth: Payer: Self-pay | Admitting: *Deleted

## 2015-03-13 DIAGNOSIS — D509 Iron deficiency anemia, unspecified: Secondary | ICD-10-CM | POA: Diagnosis not present

## 2015-03-13 NOTE — Telephone Encounter (Signed)
Per Dr. Benay Spice; notified pt that hb is better '10.2'.  Pt happy; verbalized understanding and confirmed apt for 09/16

## 2015-03-13 NOTE — Telephone Encounter (Signed)
-----   Message from Ladell Pier, MD sent at 03/13/2015  3:05 PM EDT ----- Please call patient, cea is normal

## 2015-03-13 NOTE — Telephone Encounter (Signed)
Per Dr. Benay Spice; notified pt that cea is normal.  Pt verbalized understanding.

## 2015-03-13 NOTE — Telephone Encounter (Signed)
-----   Message from Ladell Pier, MD sent at 03/12/2015  5:11 PM EDT ----- Please call patient, hb is better

## 2015-03-19 ENCOUNTER — Other Ambulatory Visit: Payer: Self-pay | Admitting: Dermatology

## 2015-03-19 DIAGNOSIS — L570 Actinic keratosis: Secondary | ICD-10-CM | POA: Diagnosis not present

## 2015-03-19 DIAGNOSIS — C44622 Squamous cell carcinoma of skin of right upper limb, including shoulder: Secondary | ICD-10-CM | POA: Diagnosis not present

## 2015-03-19 DIAGNOSIS — L821 Other seborrheic keratosis: Secondary | ICD-10-CM | POA: Diagnosis not present

## 2015-03-19 DIAGNOSIS — D0461 Carcinoma in situ of skin of right upper limb, including shoulder: Secondary | ICD-10-CM | POA: Diagnosis not present

## 2015-03-19 DIAGNOSIS — Z85828 Personal history of other malignant neoplasm of skin: Secondary | ICD-10-CM | POA: Diagnosis not present

## 2015-03-26 DIAGNOSIS — D509 Iron deficiency anemia, unspecified: Secondary | ICD-10-CM | POA: Diagnosis not present

## 2015-03-26 DIAGNOSIS — E785 Hyperlipidemia, unspecified: Secondary | ICD-10-CM | POA: Diagnosis not present

## 2015-03-26 DIAGNOSIS — I10 Essential (primary) hypertension: Secondary | ICD-10-CM | POA: Diagnosis not present

## 2015-03-26 DIAGNOSIS — Z0000 Encounter for general adult medical examination without abnormal findings: Secondary | ICD-10-CM | POA: Diagnosis not present

## 2015-03-29 DIAGNOSIS — I10 Essential (primary) hypertension: Secondary | ICD-10-CM | POA: Diagnosis not present

## 2015-03-29 DIAGNOSIS — Z0000 Encounter for general adult medical examination without abnormal findings: Secondary | ICD-10-CM | POA: Diagnosis not present

## 2015-03-29 DIAGNOSIS — E785 Hyperlipidemia, unspecified: Secondary | ICD-10-CM | POA: Diagnosis not present

## 2015-03-29 DIAGNOSIS — C189 Malignant neoplasm of colon, unspecified: Secondary | ICD-10-CM | POA: Diagnosis not present

## 2015-04-09 ENCOUNTER — Other Ambulatory Visit: Payer: Self-pay | Admitting: Dermatology

## 2015-04-09 DIAGNOSIS — C44622 Squamous cell carcinoma of skin of right upper limb, including shoulder: Secondary | ICD-10-CM | POA: Diagnosis not present

## 2015-04-16 DIAGNOSIS — Z08 Encounter for follow-up examination after completed treatment for malignant neoplasm: Secondary | ICD-10-CM | POA: Diagnosis not present

## 2015-05-06 DIAGNOSIS — D649 Anemia, unspecified: Secondary | ICD-10-CM | POA: Diagnosis not present

## 2015-05-29 DIAGNOSIS — I10 Essential (primary) hypertension: Secondary | ICD-10-CM | POA: Diagnosis not present

## 2015-05-29 DIAGNOSIS — C189 Malignant neoplasm of colon, unspecified: Secondary | ICD-10-CM | POA: Diagnosis not present

## 2015-05-29 DIAGNOSIS — D509 Iron deficiency anemia, unspecified: Secondary | ICD-10-CM | POA: Diagnosis not present

## 2015-05-29 DIAGNOSIS — E611 Iron deficiency: Secondary | ICD-10-CM | POA: Diagnosis not present

## 2015-06-28 DIAGNOSIS — D225 Melanocytic nevi of trunk: Secondary | ICD-10-CM | POA: Diagnosis not present

## 2015-06-28 DIAGNOSIS — L570 Actinic keratosis: Secondary | ICD-10-CM | POA: Diagnosis not present

## 2015-06-28 DIAGNOSIS — L821 Other seborrheic keratosis: Secondary | ICD-10-CM | POA: Diagnosis not present

## 2015-06-28 DIAGNOSIS — C44321 Squamous cell carcinoma of skin of nose: Secondary | ICD-10-CM | POA: Diagnosis not present

## 2015-06-28 DIAGNOSIS — Z85828 Personal history of other malignant neoplasm of skin: Secondary | ICD-10-CM | POA: Diagnosis not present

## 2015-06-28 DIAGNOSIS — C44602 Unspecified malignant neoplasm of skin of right upper limb, including shoulder: Secondary | ICD-10-CM | POA: Diagnosis not present

## 2015-06-28 DIAGNOSIS — C44692 Other specified malignant neoplasm of skin of right upper limb, including shoulder: Secondary | ICD-10-CM | POA: Diagnosis not present

## 2015-07-16 DIAGNOSIS — L905 Scar conditions and fibrosis of skin: Secondary | ICD-10-CM | POA: Diagnosis not present

## 2015-07-16 DIAGNOSIS — C44692 Other specified malignant neoplasm of skin of right upper limb, including shoulder: Secondary | ICD-10-CM | POA: Diagnosis not present

## 2015-07-24 DIAGNOSIS — C44321 Squamous cell carcinoma of skin of nose: Secondary | ICD-10-CM | POA: Diagnosis not present

## 2015-07-30 DIAGNOSIS — E611 Iron deficiency: Secondary | ICD-10-CM | POA: Diagnosis not present

## 2015-08-02 DIAGNOSIS — H8110 Benign paroxysmal vertigo, unspecified ear: Secondary | ICD-10-CM | POA: Diagnosis not present

## 2015-08-02 DIAGNOSIS — Z23 Encounter for immunization: Secondary | ICD-10-CM | POA: Diagnosis not present

## 2015-08-02 DIAGNOSIS — D509 Iron deficiency anemia, unspecified: Secondary | ICD-10-CM | POA: Diagnosis not present

## 2015-08-08 ENCOUNTER — Encounter (HOSPITAL_COMMUNITY): Payer: Self-pay

## 2015-08-08 ENCOUNTER — Emergency Department (HOSPITAL_COMMUNITY): Payer: Medicare Other

## 2015-08-08 ENCOUNTER — Emergency Department (HOSPITAL_COMMUNITY)
Admission: EM | Admit: 2015-08-08 | Discharge: 2015-08-08 | Disposition: A | Discharge status: Home / Self Care | Payer: Medicare Other | Attending: Emergency Medicine | Admitting: Emergency Medicine

## 2015-08-08 DIAGNOSIS — W06XXXA Fall from bed, initial encounter: Secondary | ICD-10-CM | POA: Insufficient documentation

## 2015-08-08 DIAGNOSIS — S0990XA Unspecified injury of head, initial encounter: Secondary | ICD-10-CM | POA: Diagnosis present

## 2015-08-08 DIAGNOSIS — Y9289 Other specified places as the place of occurrence of the external cause: Secondary | ICD-10-CM | POA: Diagnosis not present

## 2015-08-08 DIAGNOSIS — Z85038 Personal history of other malignant neoplasm of large intestine: Secondary | ICD-10-CM | POA: Insufficient documentation

## 2015-08-08 DIAGNOSIS — I10 Essential (primary) hypertension: Secondary | ICD-10-CM | POA: Insufficient documentation

## 2015-08-08 DIAGNOSIS — Y9389 Activity, other specified: Secondary | ICD-10-CM | POA: Insufficient documentation

## 2015-08-08 DIAGNOSIS — R51 Headache: Secondary | ICD-10-CM | POA: Diagnosis not present

## 2015-08-08 DIAGNOSIS — Z872 Personal history of diseases of the skin and subcutaneous tissue: Secondary | ICD-10-CM | POA: Insufficient documentation

## 2015-08-08 DIAGNOSIS — J449 Chronic obstructive pulmonary disease, unspecified: Secondary | ICD-10-CM | POA: Insufficient documentation

## 2015-08-08 DIAGNOSIS — E785 Hyperlipidemia, unspecified: Secondary | ICD-10-CM | POA: Diagnosis not present

## 2015-08-08 DIAGNOSIS — G2581 Restless legs syndrome: Secondary | ICD-10-CM | POA: Diagnosis not present

## 2015-08-08 DIAGNOSIS — Z79899 Other long term (current) drug therapy: Secondary | ICD-10-CM | POA: Diagnosis not present

## 2015-08-08 DIAGNOSIS — Q394 Esophageal web: Secondary | ICD-10-CM | POA: Insufficient documentation

## 2015-08-08 DIAGNOSIS — F329 Major depressive disorder, single episode, unspecified: Secondary | ICD-10-CM | POA: Diagnosis not present

## 2015-08-08 DIAGNOSIS — Z7982 Long term (current) use of aspirin: Secondary | ICD-10-CM | POA: Insufficient documentation

## 2015-08-08 DIAGNOSIS — Y998 Other external cause status: Secondary | ICD-10-CM | POA: Diagnosis not present

## 2015-08-08 DIAGNOSIS — M1990 Unspecified osteoarthritis, unspecified site: Secondary | ICD-10-CM | POA: Insufficient documentation

## 2015-08-08 DIAGNOSIS — Z87891 Personal history of nicotine dependence: Secondary | ICD-10-CM | POA: Insufficient documentation

## 2015-08-08 DIAGNOSIS — W19XXXA Unspecified fall, initial encounter: Secondary | ICD-10-CM

## 2015-08-08 DIAGNOSIS — Z880 Allergy status to penicillin: Secondary | ICD-10-CM | POA: Insufficient documentation

## 2015-08-08 DIAGNOSIS — K219 Gastro-esophageal reflux disease without esophagitis: Secondary | ICD-10-CM | POA: Insufficient documentation

## 2015-08-08 DIAGNOSIS — S0003XA Contusion of scalp, initial encounter: Secondary | ICD-10-CM | POA: Insufficient documentation

## 2015-08-08 MED ORDER — ACETAMINOPHEN 325 MG PO TABS
650.0000 mg | ORAL_TABLET | Freq: Once | ORAL | Status: AC
  Administered 2015-08-08: 650 mg via ORAL
  Filled 2015-08-08: qty 2

## 2015-08-08 NOTE — ED Provider Notes (Signed)
Patient fell out of bed yesterday morning striking her forehead on corner of night table.  Complains of frontal headache and slight blurred vision of left eye. No other associated complaint. On exam alert Glasgow Coma Score 15 HEENT exam there is a 5cm meter by 2 cm oblong  ecchymosisat left frontal area without hematoma. Eyes without redness. Extraocular muscles intact. Otherwise normocephalic atraumatic neck supple. Nontender. Neurologic Glasgow Coma Score 15 gait normal Romberg normal pronator drift normal motor strength 5 over 5 overall cranial nerves II through XII grossly intact  Orlie Dakin, MD 08/08/15 1729

## 2015-08-08 NOTE — ED Notes (Signed)
Pt c/o increasing headache/head injury from hitting her head on a nightstand after falling out of bed x 1 day ago.  Pain score 8/10.  Pt reports that she was dreaming that she was chasing monsters when she fell out of bed.  Hematoma noted to L forehead.  Pt take 28m Aspirin daily and took Aleive x 2 w/o relief.

## 2015-08-08 NOTE — ED Provider Notes (Signed)
CSN: [id number]     Arrival date & time 08/08/15  1446 History   First MD Initiated Contact with Patient 08/08/15 1658     Chief Complaint  Patient presents with  . Fall  . Head Injury     (Consider location/radiation/quality/duration/timing/severity/associated sxs/prior Treatment) HPI Comments: Patient presents today with a head injury.  She states that she fell out of bed yesterday morning and hit the left side of her forehead on a night stand.  She sustained a hematoma to the left forehead.  She is unsure is she loss consciousness, but she does not think that she did.  She states that she has had a constant headache since the time of the fall.  Headache located on the left side of her forehead in the area of hematoma.  She has taken Aleve for the pain with some relief.  She also reports some blurred vision of the left eye and some intermittent dizziness since the fall.  She denies diplopia.  She denies nausea or vomiting.  She is currently on 81 mg ASA daily, but no other anticoagulant.    The history is provided by the patient.    Past Medical History  Diagnosis Date  . PONV (postoperative nausea and vomiting)   . Hypertension   . Keratosis, actinic   . Neuromuscular disorder     neuropathy legs,   . Restless leg syndrome   . Urinary incontinence     occ, stress  . History of hiatal hernia     dx. '87 Schatzki's ring  . Hyperlipidemia     good control  . Constipation, chronic   . On aspirin at home     chronic use  . Skin cancer     multiple, "tx. area right anterior wrist"  . Abnormal tympanic membrane     right ear and mastoid problems  . Diverticulosis   . Glaucoma     bilateral-sugery with laser, using eye drops  . Macular degeneration   . Arthritis     oseoarthritis  . Hemorrhoids     Internal  . Depression   . Insomnia     hx. of  . COPD (chronic obstructive pulmonary disease)     mild COPD  . Leg cramps   . GERD (gastroesophageal reflux disease)   .  Schatzki's ring 1987  . Cancer of ascending colon 02/15/2015   Past Surgical History  Procedure Laterality Date  . Cataract extraction, bilateral Bilateral   . Colonoscopy    . Hemorrhoidectomy       x2  . Cardiac catheterization      last 12'09- negative  . Eye surgery      laser eye surgery  . Abdominal hysterectomy    . Tonsillectomy     History reviewed. No pertinent family history. Social History  Substance Use Topics  . Smoking status: Former Research scientist (life sciences)  . Smokeless tobacco: None     Comment: short time x1 - during pregnancy  . Alcohol Use: No   OB History    No data available     Review of Systems  All other systems reviewed and are negative.     Allergies  Codeine; Gabapentin; Oxybutynin; Penicillins; Pentazocine; Tizanidine; and Vicodin  Home Medications   Prior to Admission medications   Medication Sig Start Date End Date Taking? Authorizing Provider  acetaminophen (TYLENOL) 325 MG tablet Take 650 mg by mouth every 6 (six) hours as needed for moderate pain or headache. Equate  Historical Provider, MD  amLODipine (NORVASC) 5 MG tablet Take 5 mg by mouth every morning.    Historical Provider, MD  aspirin EC 81 MG tablet Take 81 mg by mouth every morning.    Historical Provider, MD  beta carotene w/minerals (OCUVITE) tablet Take 1 tablet by mouth 2 (two) times daily.    Historical Provider, MD  calcium-vitamin D (OSCAL) 250-125 MG-UNIT per tablet Take 1 tablet by mouth every morning.    Historical Provider, MD  Cyanocobalamin (VITAMIN B 12 PO) Take 1 tablet by mouth every morning.    Historical Provider, MD  ferrous sulfate 325 (65 FE) MG tablet Take 325 mg by mouth 3 (three) times daily with meals.    Historical Provider, MD  furosemide (LASIX) 40 MG tablet Take 20 mg by mouth every morning.    Historical Provider, MD  gemfibrozil (LOPID) 600 MG tablet Take 600 mg by mouth every morning.    Historical Provider, MD  loratadine (CLARITIN) 10 MG tablet Take 10 mg by  mouth daily as needed for allergies.    Historical Provider, MD  Multiple Vitamin (MULTIVITAMIN WITH MINERALS) TABS tablet Take 1 tablet by mouth every morning.    Historical Provider, MD  naproxen sodium (ANAPROX) 220 MG tablet Take 440 mg by mouth every morning.    Historical Provider, MD  nortriptyline (PAMELOR) 10 MG capsule Take 20-40 mg by mouth at bedtime.    Historical Provider, MD  Omega-3 Fatty Acids (FISH OIL PO) Take 2 capsules by mouth every morning.    Historical Provider, MD  omeprazole (PRILOSEC) 20 MG capsule Take 20 mg by mouth every morning.    Historical Provider, MD  OVER THE COUNTER MEDICATION Place 1 drop into both eyes 2 (two) times daily. Smoothe II    Historical Provider, MD  polyethylene glycol (MIRALAX / GLYCOLAX) packet Take 17 g by mouth at bedtime.    Historical Provider, MD  potassium chloride (K-DUR) 10 MEQ tablet Take 10 mEq by mouth every morning.    Historical Provider, MD  rOPINIRole (REQUIP) 0.5 MG tablet Take 0.5 mg by mouth at bedtime. 1-3 hours before bedtime.    Historical Provider, MD  traMADol (ULTRAM) 50 MG tablet Take 1-2 tablets (50-100 mg total) by mouth every 6 (six) hours as needed for moderate pain or severe pain. 02/15/15   Michael Boston, MD  vitamin C (ASCORBIC ACID) 500 MG tablet Take 500 mg by mouth daily. Will not take prior to surgery    Historical Provider, MD   BP 157/81 mmHg  Pulse 78  Temp(Src) 98.1 F (36.7 C) (Oral)  Ht 5' 3"  (1.6 m)  Wt 174 lb (78.926 kg)  BMI 30.83 kg/m2  SpO2 96% Physical Exam  Constitutional: She appears well-developed and well-nourished.  HENT:  Head:    Eyes: EOM are normal. Pupils are equal, round, and reactive to light.    Visual Acuity  Right Eye Distance: 20/50 Left Eye Distance: 20/50 (With effort; eye squinting) Bilateral Distance: 20/50  Right Eye Near: R Near: 2020 Left Eye Near:  L Near: 20/20 Bilateral Near:  20/20   Neck: Normal range of motion. Neck supple.  Cardiovascular: Normal  rate, regular rhythm and normal heart sounds.   Pulmonary/Chest: Effort normal and breath sounds normal.  Musculoskeletal: Normal range of motion.       Cervical back: She exhibits normal range of motion, no tenderness, no bony tenderness, no swelling, no edema and no deformity.       Thoracic  back: She exhibits normal range of motion, no tenderness, no bony tenderness, no swelling, no edema and no deformity.       Lumbar back: She exhibits normal range of motion, no tenderness, no bony tenderness, no swelling, no edema and no deformity.  Full UE and LE bilaterally without pain  Neurological: She is alert. She has normal strength. No cranial nerve deficit or sensory deficit. Coordination and gait normal.  Skin: Skin is warm and dry.  Psychiatric: She has a normal mood and affect.  Nursing note and vitals reviewed.   ED Course  Procedures (including critical care time) Labs Review Labs Reviewed - No data to display  Imaging Review Ct Head Wo Contrast  08/08/2015   CLINICAL DATA:  Increasing headache.  Recent head injury.  EXAM: CT HEAD WITHOUT CONTRAST  TECHNIQUE: Contiguous axial images were obtained from the base of the skull through the vertex without intravenous contrast.  COMPARISON:  03/10/2009  FINDINGS: No acute cortical infarct, hemorrhage, or mass lesion ispresent. Ventricles are of normal size. No significant extra-axial fluid collection is present. The paranasal sinuses andmastoid air cells are clear. The osseous skull is intact. There is a left frontal scalp hematoma, image 18 of series 2.  IMPRESSION: 1. No acute intracranial abnormalities. 2. Left frontal scalp hematoma.   Electronically Signed   By: Kerby Moors M.D.   On: 08/08/2015 18:29   I have personally reviewed and evaluated these images and lab results as part of my medical decision-making.   EKG Interpretation None      MDM   Final diagnoses:  None   Patient presents today with a headache and hematoma of  the left forehead after falling and hitting her head on a nightstand yesterday morning.   Normal neurological exam.  CT head w/o contrast for acute intracranial abnormalities.  Patient with normal gait.  No spinal tenderness to palpation.  Full ROM of all extremities.  Patient stable for discharge.  Return precautions given.    Hyman Bible, PA-C 08/08/15 [place], MD 08/08/15 [phone]

## 2015-08-14 DIAGNOSIS — R42 Dizziness and giddiness: Secondary | ICD-10-CM | POA: Diagnosis not present

## 2015-09-10 ENCOUNTER — Ambulatory Visit (HOSPITAL_BASED_OUTPATIENT_CLINIC_OR_DEPARTMENT_OTHER): Payer: Medicare Other

## 2015-09-10 ENCOUNTER — Telehealth: Payer: Self-pay | Admitting: Oncology

## 2015-09-10 ENCOUNTER — Ambulatory Visit (HOSPITAL_BASED_OUTPATIENT_CLINIC_OR_DEPARTMENT_OTHER): Payer: Medicare Other | Admitting: Oncology

## 2015-09-10 VITALS — BP 151/71 | HR 71 | Temp 97.5°F | Resp 18 | Ht 63.0 in | Wt 174.1 lb

## 2015-09-10 DIAGNOSIS — D649 Anemia, unspecified: Secondary | ICD-10-CM

## 2015-09-10 DIAGNOSIS — C182 Malignant neoplasm of ascending colon: Secondary | ICD-10-CM

## 2015-09-10 DIAGNOSIS — Z85038 Personal history of other malignant neoplasm of large intestine: Secondary | ICD-10-CM

## 2015-09-10 LAB — CBC WITH DIFFERENTIAL/PLATELET
BASO%: 0.7 % (ref 0.0–2.0)
Basophils Absolute: 0 10*3/uL (ref 0.0–0.1)
EOS ABS: 0.2 10*3/uL (ref 0.0–0.5)
EOS%: 3.4 % (ref 0.0–7.0)
HCT: 32.3 % — ABNORMAL LOW (ref 34.8–46.6)
HGB: 11.4 g/dL — ABNORMAL LOW (ref 11.6–15.9)
LYMPH%: 23.5 % (ref 14.0–49.7)
MCH: 32.7 pg (ref 25.1–34.0)
MCHC: 35.2 g/dL (ref 31.5–36.0)
MCV: 93 fL (ref 79.5–101.0)
MONO#: 0.5 10*3/uL (ref 0.1–0.9)
MONO%: 10.4 % (ref 0.0–14.0)
NEUT#: 3.1 10*3/uL (ref 1.5–6.5)
NEUT%: 62 % (ref 38.4–76.8)
PLATELETS: 247 10*3/uL (ref 145–400)
RBC: 3.48 10*6/uL — AB (ref 3.70–5.45)
RDW: 13.5 % (ref 11.2–14.5)
WBC: 5 10*3/uL (ref 3.9–10.3)
lymph#: 1.2 10*3/uL (ref 0.9–3.3)

## 2015-09-10 NOTE — Telephone Encounter (Signed)
Gave and printed appt sched and avs for pt for March 2017..the patient sched to see Dr. Benson Norway on 12.5 @ 9am

## 2015-09-10 NOTE — Progress Notes (Signed)
  [facility] OFFICE PROGRESS NOTE   Diagnosis: Colon cancer  INTERVAL HISTORY:   Erica Escobar returns for a scheduled visit. Good appetite. She feels well. She takes MiraLAX in order to have a bowel movement. She continues iron 3 times per day. She had one episode of "hemorrhoid "bleeding after a firm bowel movement.  Objective:  Vital signs in last 24 hours:  Blood pressure 151/71, pulse 71, temperature 97.5 F (36.4 C), temperature source Oral, resp. rate 18, height 5' 3"  (1.6 m), weight 174 lb 1.6 oz (78.971 kg), SpO2 100 %.    HEENT: Neck without mass Lymphatics: No cervical, supra-clavicular, axillary, or inguinal nodes Resp: Lungs clear bilaterally Cardio: Regular rate and rhythm GI: No hepatosplenomegaly, no mass, nontender Vascular: No leg edema N  Lab Results:  Lab Results  Component Value Date   WBC 5.0 09/10/2015   HGB 11.4* 09/10/2015   HCT 32.3* 09/10/2015   MCV 93.0 09/10/2015   PLT 247 09/10/2015   NEUTROABS 3.1 09/10/2015     Lab Results  Component Value Date   CEA 2.1 03/12/2015    Medications: I have reviewed the patient's current medications.  Assessment/Plan: 1. Poorly differentiated adenocarcinoma of the ascending colon, stage II (T3 N0) , status post a right colectomy 02/15/2015  Loss of MLH1 and PMS2, MSI-high, BRAF mutation detected  2. Anemia-likely secondary to bleeding from the colon cancer and surgery, improved 3. Ascending colon "polyps "removed on the colonoscopy 12/22/2014 with the pathology revealing poorly differentiated adenocarcinoma and a sessile serrated adenoma polyp, similar to the pathology on the hepatic flexure mass 4. History of multiple skin cancers 5. Glaucoma    Disposition:  Erica Escobar remains in clinical remission from colon cancer. We will follow-up on the CEA from today. She has persistent mild anemia, unlikely related to iron deficiency. She will decrease the first sulfate to once daily.  Hopefully this will help the constipation. She will return for an office and lab visit in 6 months. We will refer her to Dr. Benson Norway for a one-year surveillance colonoscopy.  Betsy Coder, MD  09/10/2015  11:48 AM

## 2015-09-11 ENCOUNTER — Telehealth: Payer: Self-pay | Admitting: *Deleted

## 2015-09-11 LAB — CEA: CEA: 2.4 ng/mL (ref 0.0–5.0)

## 2015-09-11 NOTE — Telephone Encounter (Signed)
-----   Message from Ladell Pier, MD sent at 09/11/2015  9:15 AM EDT ----- Please call patient, cea is normal

## 2015-09-13 DIAGNOSIS — H40013 Open angle with borderline findings, low risk, bilateral: Secondary | ICD-10-CM | POA: Diagnosis not present

## 2015-09-13 DIAGNOSIS — Z9849 Cataract extraction status, unspecified eye: Secondary | ICD-10-CM | POA: Diagnosis not present

## 2015-10-04 DIAGNOSIS — L820 Inflamed seborrheic keratosis: Secondary | ICD-10-CM | POA: Diagnosis not present

## 2015-10-04 DIAGNOSIS — Z85828 Personal history of other malignant neoplasm of skin: Secondary | ICD-10-CM | POA: Diagnosis not present

## 2015-10-04 DIAGNOSIS — D0472 Carcinoma in situ of skin of left lower limb, including hip: Secondary | ICD-10-CM | POA: Diagnosis not present

## 2015-10-04 DIAGNOSIS — L570 Actinic keratosis: Secondary | ICD-10-CM | POA: Diagnosis not present

## 2015-10-04 DIAGNOSIS — D1801 Hemangioma of skin and subcutaneous tissue: Secondary | ICD-10-CM | POA: Diagnosis not present

## 2015-11-12 DIAGNOSIS — G2581 Restless legs syndrome: Secondary | ICD-10-CM | POA: Diagnosis not present

## 2015-11-12 DIAGNOSIS — D649 Anemia, unspecified: Secondary | ICD-10-CM | POA: Diagnosis not present

## 2015-11-12 DIAGNOSIS — R252 Cramp and spasm: Secondary | ICD-10-CM | POA: Diagnosis not present

## 2015-11-18 DIAGNOSIS — Z85038 Personal history of other malignant neoplasm of large intestine: Secondary | ICD-10-CM | POA: Diagnosis not present

## 2015-11-22 ENCOUNTER — Encounter: Payer: Self-pay | Admitting: Neurology

## 2015-11-22 ENCOUNTER — Ambulatory Visit (INDEPENDENT_AMBULATORY_CARE_PROVIDER_SITE_OTHER): Payer: Medicare Other | Admitting: Neurology

## 2015-11-22 ENCOUNTER — Telehealth: Payer: Self-pay | Admitting: Neurology

## 2015-11-22 VITALS — BP 130/80 | HR 90 | Ht 63.0 in | Wt 177.0 lb

## 2015-11-22 DIAGNOSIS — G4752 REM sleep behavior disorder: Secondary | ICD-10-CM | POA: Diagnosis not present

## 2015-11-22 DIAGNOSIS — G609 Hereditary and idiopathic neuropathy, unspecified: Secondary | ICD-10-CM | POA: Diagnosis not present

## 2015-11-22 DIAGNOSIS — R252 Cramp and spasm: Secondary | ICD-10-CM | POA: Diagnosis not present

## 2015-11-22 MED ORDER — PREGABALIN 50 MG PO CAPS: 50.0000 mg | ORAL_CAPSULE | Freq: Every day | ORAL | Status: DC

## 2015-11-22 NOTE — Telephone Encounter (Signed)
Called patient and made her aware she could  take a shot glass size of tonic water prior to bed to help with leg cramps. She will call with any concerns.

## 2015-11-22 NOTE — Progress Notes (Signed)
Erica Escobar was seen today in neurologic consultation at the request of Horatio Pel, MD.  The consultation is for the evaluation of leg pain.  I have reviewed records made available to me.  The patient was previously a patient of Dr. Jannifer Franklin.  I do not have his records as they are not currently in epic.  There is a phone note from April, 2014 and it appears that he was seeing her for occipital neuralgia and tried gabapentin and the patient felt that caused throat swelling and leg swelling and it was discontinued.  Pt does state that she didn't have trouble breathing with the medication.  The patient states today that she is having burning, stinging pain in the legs but mostly below the knee and "the bottom of my feet are the worse."  It has been going on for years but it is getting worse.  She states that balance is off when she first gets off of the couch but otherwise she does pretty well.  She asks me what she can do to improve balance.  She is not DM.    Her primary care physician tried her on Requip 1 mg at night for presumed restless leg syndrome.  It is not helping.  She was also on pamelor 30 mg at night and it was decreased to 20 mg at night because it wasn't helping.  She thinks it may help a little but not a lot.  Her legs do cramp some at night and walking helps that.  She does report that she has previously had an EMG and was told that she had neuropathy.  The etiology was unknown.  This was years ago.   She does have episodes of acting out dreams.  She has fallen out of the bed 3 times but has vivid dreams nearly every night.   ALLERGIES:   Allergies  Allergen Reactions  . Codeine Itching  . Gabapentin Swelling    Throat and leg swelling  . Niaspan [Niacin]   . Oxybutynin Other (See Comments)    "made me go crazy"-- had crazy dreams.   . Paxil [Paroxetine Hcl]   . Penicillins Itching  . Pentazocine Itching and Nausea Only    Headache.   . Tizanidine Other (See  Comments)    "made me crazy"  . Vicodin [Hydrocodone-Acetaminophen] Other (See Comments)    "made me go crazy"    CURRENT MEDICATIONS:  Outpatient Encounter Prescriptions as of 11/22/2015  Medication Sig  . amLODipine (NORVASC) 5 MG tablet Take 5 mg by mouth every morning.  Marland Kitchen aspirin EC 81 MG tablet Take 81 mg by mouth every morning.  . beta carotene w/minerals (OCUVITE) tablet Take 1 tablet by mouth daily.   . calcium-vitamin D (OSCAL) 250-125 MG-UNIT per tablet Take 1 tablet by mouth every morning.  . Cyanocobalamin (VITAMIN B 12 PO) Take 1 tablet by mouth every morning.  . furosemide (LASIX) 40 MG tablet Take 20 mg by mouth every morning.  Marland Kitchen gemfibrozil (LOPID) 600 MG tablet Take 600 mg by mouth every morning.  Marland Kitchen KLOR-CON M20 20 MEQ tablet Take 20 mEq by mouth daily.  Marland Kitchen loratadine (CLARITIN) 10 MG tablet Take 10 mg by mouth daily as needed for allergies.  . Multiple Vitamin (MULTIVITAMIN WITH MINERALS) TABS tablet Take 1 tablet by mouth every morning.  . naproxen sodium (ANAPROX) 220 MG tablet Take 440 mg by mouth every morning.  . nortriptyline (PAMELOR) 10 MG capsule Take 20-40 mg by mouth  at bedtime.  . Omega-3 Fatty Acids (FISH OIL PO) Take 2 capsules by mouth every morning.  Marland Kitchen omeprazole (PRILOSEC) 20 MG capsule Take 20 mg by mouth every morning.  Marland Kitchen OVER THE COUNTER MEDICATION Place 1 drop into both eyes 2 (two) times daily. Smoothe II  . pantoprazole (PROTONIX) 40 MG tablet Take 40 mg by mouth 2 (two) times daily.   . polyethylene glycol (MIRALAX / GLYCOLAX) packet Take 17 g by mouth at bedtime.  . potassium chloride (K-DUR) 10 MEQ tablet Take 10 mEq by mouth every morning.  Marland Kitchen rOPINIRole (REQUIP) 1 MG tablet Take 1 mg by mouth at bedtime. 1-3 hours before bedtime  . vitamin C (ASCORBIC ACID) 500 MG tablet Take 500 mg by mouth daily. Will not take prior to surgery  . [DISCONTINUED] acetaminophen (TYLENOL) 325 MG tablet Take 650 mg by mouth every 6 (six) hours as needed for  moderate pain or headache. Equate  . [DISCONTINUED] ferrous sulfate 325 (65 FE) MG tablet Take 325 mg by mouth 3 (three) times daily with meals.  . [DISCONTINUED] traMADol (ULTRAM) 50 MG tablet Take 1-2 tablets (50-100 mg total) by mouth every 6 (six) hours as needed for moderate pain or severe pain. (Patient not taking: Reported on 08/08/2015)   No facility-administered encounter medications on file as of 11/22/2015.    PAST MEDICAL HISTORY:   Past Medical History  Diagnosis Date  . PONV (postoperative nausea and vomiting)   . Hypertension   . Keratosis, actinic   . Neuromuscular disorder (HCC)     neuropathy legs,   . Restless leg syndrome   . Urinary incontinence     occ, stress  . History of hiatal hernia     dx. '87 Schatzki's ring  . Hyperlipidemia     good control  . Constipation, chronic   . On aspirin at home     chronic use  . Skin cancer     multiple, "tx. area right anterior wrist"  . Abnormal tympanic membrane     right ear and mastoid problems  . Diverticulosis   . Glaucoma     bilateral-sugery with laser, using eye drops  . Macular degeneration   . Arthritis     oseoarthritis  . Hemorrhoids     Internal  . Depression   . Insomnia     hx. of  . COPD (chronic obstructive pulmonary disease) (HCC)     mild COPD  . Leg cramps   . GERD (gastroesophageal reflux disease)   . Schatzki's ring 1987  . Cancer of ascending colon ([place]) 02/15/2015    PAST SURGICAL HISTORY:   Past Surgical History  Procedure Laterality Date  . Inner ear surgery Right     3-4 times  . Hemorroidectomy    . Abdominal hysterectomy    . Knee surgery Right   . Foot surgery Right   . Skin cancer excision      right/left thumb nail, left face, right ear/neck, right leg  . Tonsillectomy    . Bladder repair    . Mastoid debridement      x2  . Cardiac catheterization  2007, 2009  . Finger nail surgery    . Toe fusion    . Leg surgery Bilateral     laser ablation  . Cholecystectomy     . Intraoperative arteriogram      right arm  . Cataract extraction Bilateral   . Colectomy      SOCIAL HISTORY:  Social History   Social History  . Marital Status: Married    Spouse Name: N/A  . Number of Children: N/A  . Years of Education: N/A   Occupational History  . Not on file.   Social History Main Topics  . Smoking status: Former Research scientist (life sciences)  . Smokeless tobacco: Not on file     Comment: short time x1 - during pregnancy  . Alcohol Use: No  . Drug Use: No  . Sexual Activity: Not Currently   Other Topics Concern  . Not on file   Social History Narrative    FAMILY HISTORY:   Family Status  Relation Status Death Age  . Mother Deceased     DM  . Father Deceased     lymphoma  . Brother Deceased     lung cancer  . Brother Alive     bladder cancer    ROS:  A complete 10 system review of systems was obtained and was unremarkable apart from what is mentioned above.  PHYSICAL EXAMINATION:    VITALS:   Filed Vitals:   11/22/15 1313  BP: 130/80  Pulse: 90  Height: 5' 3"  (1.6 m)  Weight: 177 lb (80.287 kg)    GEN:  Normal appears female in no acute distress.  Appears stated age. HEENT:  Normocephalic, atraumatic. The mucous membranes are moist. The superficial temporal arteries are without ropiness or tenderness. Cardiovascular: Regular rate and rhythm. Lungs: Clear to auscultation bilaterally. Neck/Heme: There are no carotid bruits noted bilaterally.  NEUROLOGICAL: Orientation:  The patient is alert and oriented x 3.  Fund of knowledge is appropriate.  Recent and remote memory intact.  Attention span and concentration normal.  Repeats and names without difficulty. Cranial nerves: There is good facial symmetry. The pupils are equal round and reactive to light bilaterally. Fundoscopic exam is attempted but the disc margins are not well visualized bilaterally. Extraocular muscles are intact and visual fields are full to confrontational testing. Speech is  fluent and clear. Soft palate rises symmetrically and there is no tongue deviation. Hearing is intact to conversational tone. Tone: Tone is good throughout. Sensation: Sensation is intact to light touch and pinprick throughout (facial, trunk, extremities).  Pinprick is decreased in a stocking and glove distribution.  Vibration is markedly decreased in a distal fashion.. There is no extinction with double simultaneous stimulation. There is no sensory dermatomal level identified. Coordination:  The patient has no difficulty with RAM's or FNF bilaterally. Motor: Strength is 5/5 in the bilateral upper and lower extremities.  Shoulder shrug is equal and symmetric. There is no pronator drift.  There are no fasciculations noted. DTR's: Deep tendon reflexes are 2-/4 at the bilateral biceps, triceps, brachioradialis, patella and absent at the bilateral achilles.  Plantar responses are downgoing bilaterally. Gait and Station: The patient is able to ambulate without difficulty.  she is wide-based.  She is unable to ambulate in a tandem fashion.  She is able to stand in Romberg position with eyes open and closed.   position.  Lab Results  Component Value Date   WBC 5.0 09/10/2015   HGB 11.4* 09/10/2015   HCT 32.3* 09/10/2015   MCV 93.0 09/10/2015   PLT 247 09/10/2015    No results found for: VITAMINB12 (is a B12 from 12/18/2014 from her primary care physician and it was 912).    Chemistry      Component Value Date/Time   NA 141 02/19/2015 0521   K 3.9 02/19/2015 0521   CL  104 02/19/2015 0521   CO2 29 02/19/2015 0521   BUN 8 02/19/2015 0521   CREATININE 0.89 02/19/2015 0521      Component Value Date/Time   CALCIUM 8.6 02/19/2015 0521   ALKPHOS 74 03/11/2009 0450   AST 24 03/11/2009 0450   ALT 15 03/11/2009 0450   BILITOT 0.6 03/11/2009 0450     Lab Results  Component Value Date   HGBA1C 5.1 02/11/2015     IMPRESSION/PLAN  1. gait instability due to peripheral neuropathy  -I wanted  to do lab work today to look at causes for peripheral neuropathy, but she states that she just had lab work done at her primary care physician and did not want further lab work done today.  I will try to get a copy.  -She asked me about what she could do to help balance and I talked to her about balance therapy, but she ultimately decided that she did not want to go.  -We decided to very cautiously try Lyrica, 50 mg daily.  I explained that this is a similar drug to gabapentin.  She had difficulties with this drug in the past.  I gave her samples.  I will call her in 2 weeks and if she tolerates it, we will obviously need to increase this very small dose.  Risks, benefits, side effects and alternative therapies were discussed.  The opportunity to ask questions was given and they were answered to the best of my ability.  The patient expressed understanding and willingness to follow the outlined treatment protocols.  -Safety associated with peripheral neuropathy was discussed.  2.  REM behavior disorder  -I did not want to add too many medications at once.  For now, she will need to put pillows around her to make sure that she is safe and does not fall out of the bed.   3.  Nightime leg cramps  -try tonic water  4.  I will plan on seeing her back in the next few months, sooner should new neurologic issues arise.  Much greater than 50% of this visit was spent in counseling with the patient and the family.  Total face to face time:  60 min

## 2015-11-22 NOTE — Patient Instructions (Signed)
You can take a shot glass size of tonic water prior to bed to help with leg cramps.

## 2015-12-04 DIAGNOSIS — Z85828 Personal history of other malignant neoplasm of skin: Secondary | ICD-10-CM | POA: Diagnosis not present

## 2015-12-04 DIAGNOSIS — D044 Carcinoma in situ of skin of scalp and neck: Secondary | ICD-10-CM | POA: Diagnosis not present

## 2015-12-04 DIAGNOSIS — L570 Actinic keratosis: Secondary | ICD-10-CM | POA: Diagnosis not present

## 2015-12-04 DIAGNOSIS — L218 Other seborrheic dermatitis: Secondary | ICD-10-CM | POA: Diagnosis not present

## 2015-12-04 DIAGNOSIS — D045 Carcinoma in situ of skin of trunk: Secondary | ICD-10-CM | POA: Diagnosis not present

## 2015-12-06 ENCOUNTER — Telehealth: Payer: Self-pay | Admitting: Neurology

## 2015-12-06 NOTE — Telephone Encounter (Signed)
She wanted to wait and finish the samples to let me know.

## 2015-12-06 NOTE — Telephone Encounter (Signed)
Spoke with patient to see how she is doing on Lyrica 50 mg daily. She states she is doing well. She has no interest in increasing the medication right now. She states she will call when she is out of samples and may be okay with increasing at that time.

## 2015-12-06 NOTE — Telephone Encounter (Signed)
Okay.  Would she at least like a RX if she is doing well?

## 2015-12-12 ENCOUNTER — Telehealth: Payer: Self-pay | Admitting: Neurology

## 2015-12-12 NOTE — Telephone Encounter (Signed)
PT called in regards to the medication Lyraca/Dawn CB# [phone]

## 2015-12-13 NOTE — Telephone Encounter (Signed)
Patient states that for several days she has had swelling in her legs and face and aching in all her joints. She states she also feels like her mind is not clear. She wants to stop Lyrica, because she thinks symptoms are coming from medication. She will stop medication today, aware Dr Tat is not back until Monday to suggest any alternative medications. She is still not interested in balance therapy. Please advise.

## 2015-12-13 NOTE — Telephone Encounter (Signed)
Stop medication for now. Thanks

## 2015-12-13 NOTE — Telephone Encounter (Signed)
Patient stopped medication for now and will address next steps next week.

## 2015-12-13 NOTE — Telephone Encounter (Signed)
Left message for patient to call back  

## 2016-01-14 DIAGNOSIS — R252 Cramp and spasm: Secondary | ICD-10-CM | POA: Diagnosis not present

## 2016-01-14 DIAGNOSIS — G629 Polyneuropathy, unspecified: Secondary | ICD-10-CM | POA: Diagnosis not present

## 2016-01-14 DIAGNOSIS — G2581 Restless legs syndrome: Secondary | ICD-10-CM | POA: Diagnosis not present

## 2016-02-11 DIAGNOSIS — Z85038 Personal history of other malignant neoplasm of large intestine: Secondary | ICD-10-CM | POA: Diagnosis not present

## 2016-02-11 DIAGNOSIS — Z980 Intestinal bypass and anastomosis status: Secondary | ICD-10-CM | POA: Diagnosis not present

## 2016-02-11 DIAGNOSIS — D126 Benign neoplasm of colon, unspecified: Secondary | ICD-10-CM | POA: Diagnosis not present

## 2016-02-11 DIAGNOSIS — Z1211 Encounter for screening for malignant neoplasm of colon: Secondary | ICD-10-CM | POA: Diagnosis not present

## 2016-02-11 DIAGNOSIS — K5730 Diverticulosis of large intestine without perforation or abscess without bleeding: Secondary | ICD-10-CM | POA: Diagnosis not present

## 2016-02-11 DIAGNOSIS — K635 Polyp of colon: Secondary | ICD-10-CM | POA: Diagnosis not present

## 2016-02-11 DIAGNOSIS — Z86010 Personal history of colonic polyps: Secondary | ICD-10-CM | POA: Diagnosis not present

## 2016-02-11 DIAGNOSIS — K5140 Inflammatory polyps of colon without complications: Secondary | ICD-10-CM | POA: Diagnosis not present

## 2016-02-18 ENCOUNTER — Ambulatory Visit: Payer: Medicare Other | Admitting: Neurology

## 2016-03-04 DIAGNOSIS — L814 Other melanin hyperpigmentation: Secondary | ICD-10-CM | POA: Diagnosis not present

## 2016-03-04 DIAGNOSIS — Z85828 Personal history of other malignant neoplasm of skin: Secondary | ICD-10-CM | POA: Diagnosis not present

## 2016-03-04 DIAGNOSIS — D225 Melanocytic nevi of trunk: Secondary | ICD-10-CM | POA: Diagnosis not present

## 2016-03-04 DIAGNOSIS — L821 Other seborrheic keratosis: Secondary | ICD-10-CM | POA: Diagnosis not present

## 2016-03-04 DIAGNOSIS — L570 Actinic keratosis: Secondary | ICD-10-CM | POA: Diagnosis not present

## 2016-03-07 ENCOUNTER — Telehealth: Payer: Self-pay | Admitting: Oncology

## 2016-03-07 NOTE — Telephone Encounter (Signed)
Returned call to patient re rescheduling 3/28 lab/fu due to her spouse passed awary. Spoke with patient re new date/time for 4/7 @ 11:15 am.

## 2016-03-10 ENCOUNTER — Other Ambulatory Visit: Payer: Medicare Other

## 2016-03-10 ENCOUNTER — Ambulatory Visit: Payer: Medicare Other | Admitting: Nurse Practitioner

## 2016-03-20 ENCOUNTER — Telehealth: Payer: Self-pay | Admitting: Nurse Practitioner

## 2016-03-20 ENCOUNTER — Other Ambulatory Visit (HOSPITAL_BASED_OUTPATIENT_CLINIC_OR_DEPARTMENT_OTHER): Payer: Medicare Other

## 2016-03-20 ENCOUNTER — Ambulatory Visit (HOSPITAL_BASED_OUTPATIENT_CLINIC_OR_DEPARTMENT_OTHER): Payer: Medicare Other | Admitting: Nurse Practitioner

## 2016-03-20 VITALS — BP 138/70 | HR 74 | Temp 97.7°F | Resp 18 | Ht 63.0 in | Wt 176.7 lb

## 2016-03-20 DIAGNOSIS — Z85038 Personal history of other malignant neoplasm of large intestine: Secondary | ICD-10-CM | POA: Diagnosis not present

## 2016-03-20 DIAGNOSIS — C182 Malignant neoplasm of ascending colon: Secondary | ICD-10-CM

## 2016-03-20 DIAGNOSIS — D649 Anemia, unspecified: Secondary | ICD-10-CM

## 2016-03-20 DIAGNOSIS — H409 Unspecified glaucoma: Secondary | ICD-10-CM

## 2016-03-20 LAB — CBC WITH DIFFERENTIAL/PLATELET
BASO%: 0.7 % (ref 0.0–2.0)
BASOS ABS: 0 10*3/uL (ref 0.0–0.1)
EOS%: 6.8 % (ref 0.0–7.0)
Eosinophils Absolute: 0.4 10*3/uL (ref 0.0–0.5)
HCT: 35.5 % (ref 34.8–46.6)
HGB: 12.3 g/dL (ref 11.6–15.9)
LYMPH%: 22.5 % (ref 14.0–49.7)
MCH: 31.8 pg (ref 25.1–34.0)
MCHC: 34.6 g/dL (ref 31.5–36.0)
MCV: 91.8 fL (ref 79.5–101.0)
MONO#: 0.5 10*3/uL (ref 0.1–0.9)
MONO%: 8.4 % (ref 0.0–14.0)
NEUT#: 3.9 10*3/uL (ref 1.5–6.5)
NEUT%: 61.6 % (ref 38.4–76.8)
Platelets: 263 10*3/uL (ref 145–400)
RBC: 3.86 10*6/uL (ref 3.70–5.45)
RDW: 13.3 % (ref 11.2–14.5)
WBC: 6.2 10*3/uL (ref 3.9–10.3)
lymph#: 1.4 10*3/uL (ref 0.9–3.3)

## 2016-03-20 NOTE — Progress Notes (Signed)
  [facility] OFFICE PROGRESS NOTE   Diagnosis:  Colon cancer  INTERVAL HISTORY:   Erica Escobar returns as scheduled. No change in bowel habits. Bowels moving regularly. No bloody or black stools. No abdominal pain.  Objective:  Vital signs in last 24 hours:  Blood pressure 138/70, pulse 74, temperature 97.7 F (36.5 C), temperature source Oral, resp. rate 18, height 5' 3" (1.6 m), weight 176 lb 11.2 oz (80.151 kg), SpO2 99 %.    HEENT: No thrush or ulcers. Lymphatics: No palpable cervical, supraclavicular, axillary or inguinal lymph nodes. Resp: Lungs clear bilaterally. Cardio: Regular rate and rhythm. GI: Abdomen soft and nontender. No hepatomegaly. No mass. Vascular: Trace lower leg edema bilaterally.   Lab Results:  Lab Results  Component Value Date   WBC 6.2 03/20/2016   HGB 12.3 03/20/2016   HCT 35.5 03/20/2016   MCV 91.8 03/20/2016   PLT 263 03/20/2016   NEUTROABS 3.9 03/20/2016    Imaging:  No results found.  Medications: I have reviewed the patient's current medications.  Assessment/Plan: 1. Poorly differentiated adenocarcinoma of the ascending colon, stage II (T3 N0) , status post a right colectomy 02/15/2015  Loss of MLH1 and PMS2, MSI-high, BRAF mutation detected  Colonoscopy February 2017  2. Anemia-likely secondary to bleeding from the colon cancer and surgery. Hemoglobin in normal range 03/20/2016 3. Ascending colon "polyps" removed on the colonoscopy 12/22/2014 with the pathology revealing poorly differentiated adenocarcinoma and a sessile serrated adenoma polyp, similar to the pathology on the hepatic flexure mass 4. History of multiple skin cancers 5. Glaucoma    Disposition: Erica Escobar remains in clinical remission from colon cancer. We will follow-up on the CEA from today. She will return for a follow-up visit and CEA in 6 months.  We will contact Dr. Ulyses Amor office for the recent colonoscopy report.    Ned Card  ANP/GNP-BC   03/20/2016  12:26 PM

## 2016-03-20 NOTE — Telephone Encounter (Signed)
per pof to sch pt appt-gave pt copy of avs °

## 2016-03-21 LAB — CEA (PARALLEL TESTING): CEA: 1.4 ng/mL

## 2016-03-21 LAB — CEA: CEA: 2.8 ng/mL (ref 0.0–4.7)

## 2016-03-23 ENCOUNTER — Telehealth: Payer: Self-pay | Admitting: *Deleted

## 2016-03-23 NOTE — Telephone Encounter (Signed)
Per Dr. Benay Spice, pt notified that cea is normal.  Pt appreciative of call and has no questions at this time.

## 2016-03-23 NOTE — Telephone Encounter (Signed)
-----   Message from Ladell Pier, MD sent at 03/21/2016  9:00 AM EDT ----- Please call patient, cea is normal

## 2016-03-25 DIAGNOSIS — E785 Hyperlipidemia, unspecified: Secondary | ICD-10-CM | POA: Diagnosis not present

## 2016-03-25 DIAGNOSIS — I10 Essential (primary) hypertension: Secondary | ICD-10-CM | POA: Diagnosis not present

## 2016-03-25 DIAGNOSIS — Z0000 Encounter for general adult medical examination without abnormal findings: Secondary | ICD-10-CM | POA: Diagnosis not present

## 2016-03-25 DIAGNOSIS — E611 Iron deficiency: Secondary | ICD-10-CM | POA: Diagnosis not present

## 2016-04-01 DIAGNOSIS — G4752 REM sleep behavior disorder: Secondary | ICD-10-CM | POA: Diagnosis not present

## 2016-04-01 DIAGNOSIS — G629 Polyneuropathy, unspecified: Secondary | ICD-10-CM | POA: Diagnosis not present

## 2016-04-01 DIAGNOSIS — Z85038 Personal history of other malignant neoplasm of large intestine: Secondary | ICD-10-CM | POA: Diagnosis not present

## 2016-04-01 DIAGNOSIS — H3530 Unspecified macular degeneration: Secondary | ICD-10-CM | POA: Diagnosis not present

## 2016-06-05 DIAGNOSIS — B078 Other viral warts: Secondary | ICD-10-CM | POA: Diagnosis not present

## 2016-06-05 DIAGNOSIS — L821 Other seborrheic keratosis: Secondary | ICD-10-CM | POA: Diagnosis not present

## 2016-06-05 DIAGNOSIS — L814 Other melanin hyperpigmentation: Secondary | ICD-10-CM | POA: Diagnosis not present

## 2016-06-05 DIAGNOSIS — D045 Carcinoma in situ of skin of trunk: Secondary | ICD-10-CM | POA: Diagnosis not present

## 2016-06-05 DIAGNOSIS — Z85828 Personal history of other malignant neoplasm of skin: Secondary | ICD-10-CM | POA: Diagnosis not present

## 2016-06-05 DIAGNOSIS — L570 Actinic keratosis: Secondary | ICD-10-CM | POA: Diagnosis not present

## 2016-09-08 DIAGNOSIS — L821 Other seborrheic keratosis: Secondary | ICD-10-CM | POA: Diagnosis not present

## 2016-09-08 DIAGNOSIS — D1801 Hemangioma of skin and subcutaneous tissue: Secondary | ICD-10-CM | POA: Diagnosis not present

## 2016-09-08 DIAGNOSIS — D0471 Carcinoma in situ of skin of right lower limb, including hip: Secondary | ICD-10-CM | POA: Diagnosis not present

## 2016-09-08 DIAGNOSIS — C44622 Squamous cell carcinoma of skin of right upper limb, including shoulder: Secondary | ICD-10-CM | POA: Diagnosis not present

## 2016-09-08 DIAGNOSIS — Z85828 Personal history of other malignant neoplasm of skin: Secondary | ICD-10-CM | POA: Diagnosis not present

## 2016-09-08 DIAGNOSIS — L814 Other melanin hyperpigmentation: Secondary | ICD-10-CM | POA: Diagnosis not present

## 2016-09-08 DIAGNOSIS — L570 Actinic keratosis: Secondary | ICD-10-CM | POA: Diagnosis not present

## 2016-09-16 DIAGNOSIS — H40013 Open angle with borderline findings, low risk, bilateral: Secondary | ICD-10-CM | POA: Diagnosis not present

## 2016-09-16 DIAGNOSIS — H353131 Nonexudative age-related macular degeneration, bilateral, early dry stage: Secondary | ICD-10-CM | POA: Diagnosis not present

## 2016-09-16 DIAGNOSIS — H11423 Conjunctival edema, bilateral: Secondary | ICD-10-CM | POA: Diagnosis not present

## 2016-09-16 DIAGNOSIS — H35033 Hypertensive retinopathy, bilateral: Secondary | ICD-10-CM | POA: Diagnosis not present

## 2016-09-16 DIAGNOSIS — H26493 Other secondary cataract, bilateral: Secondary | ICD-10-CM | POA: Diagnosis not present

## 2016-09-21 ENCOUNTER — Ambulatory Visit (HOSPITAL_BASED_OUTPATIENT_CLINIC_OR_DEPARTMENT_OTHER): Payer: Medicare Other | Admitting: Oncology

## 2016-09-21 ENCOUNTER — Telehealth: Payer: Self-pay | Admitting: *Deleted

## 2016-09-21 ENCOUNTER — Other Ambulatory Visit (HOSPITAL_BASED_OUTPATIENT_CLINIC_OR_DEPARTMENT_OTHER): Payer: Medicare Other

## 2016-09-21 ENCOUNTER — Telehealth: Payer: Self-pay | Admitting: Oncology

## 2016-09-21 VITALS — BP 132/86 | HR 85 | Temp 98.0°F | Resp 17 | Ht 63.0 in | Wt 175.8 lb

## 2016-09-21 DIAGNOSIS — C182 Malignant neoplasm of ascending colon: Secondary | ICD-10-CM

## 2016-09-21 DIAGNOSIS — D649 Anemia, unspecified: Secondary | ICD-10-CM

## 2016-09-21 DIAGNOSIS — H401190 Primary open-angle glaucoma, unspecified eye, stage unspecified: Secondary | ICD-10-CM | POA: Diagnosis not present

## 2016-09-21 DIAGNOSIS — Z85038 Personal history of other malignant neoplasm of large intestine: Secondary | ICD-10-CM

## 2016-09-21 DIAGNOSIS — Z85828 Personal history of other malignant neoplasm of skin: Secondary | ICD-10-CM | POA: Diagnosis not present

## 2016-09-21 LAB — CEA (IN HOUSE-CHCC): CEA (CHCC-In House): 1.54 ng/mL (ref 0.00–5.00)

## 2016-09-21 NOTE — Progress Notes (Signed)
  [place] OFFICE PROGRESS NOTE   Diagnosis: Colon cancer  INTERVAL HISTORY:   Ms. Zachman returns as scheduled. She complains of intermittent sharp pain in the right abdomen for the past several weeks. She also has burning discomfort in the upper abdomen/chest. She is having bowel movements. No bleeding. She complains of exertional dyspnea. Mild cough and wheezing.  Objective:  Vital signs in last 24 hours:  Blood pressure 132/86, pulse 85, temperature 98 F (36.7 C), temperature source Oral, resp. rate 17, height _0  (1.6 m), weight 175 lb 12.8 oz (79.7 kg), SpO2 98 %.    HEENT: Neck without mass, mild conjunctival pallor Lymphatics: No cervical, supra-clavicular, axillary, or inguinal nodes Resp: Lungs clear bilaterally Cardio: Regular rate and rhythm GI: No hepatosplenic the, no mass, mild tenderness in the right mid abdomen Vascular: No leg edema     Lab Results:  Lab Results  Component Value Date   WBC 6.2 03/20/2016   HGB 12.3 03/20/2016   HCT 35.5 03/20/2016   MCV 91.8 03/20/2016   PLT 263 03/20/2016   NEUTROABS 3.9 03/20/2016    CEA pending today Medications: I have reviewed the patient's current medications.  Assessment/Plan: 1. 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 Loss of MLH1 and PMS2, MSI-high, BRAF mutation detected ? Colonoscopy February 2017  2. Anemia-likely secondary to bleeding from the colon cancer and surgery. Hemoglobin in normal range 03/20/2016 3. Ascending colon "polyps" removed on the colonoscopy 12/22/2014 with the pathology revealing poorly differentiated adenocarcinoma and a sessile serrated adenoma polyp, similar to the pathology on the hepatic flexure mass 4. History of multiple skin cancers 5. Glaucoma   Disposition:  Erica Escobar remains in clinical remission from colon cancer. We will follow-up on the CEA from today. She  will return for an office visit and CEA in 6 months.  She will contact us for consistent abdominal pain. She will try an over-the-counter antiacid for relief of the pain.  She is scheduled to see Dr. Shelia Media next week. She will ask him to evaluate the dyspnea. We will ask Dr. Shelia Media to check a hemoglobin level.   Betsy Coder, MD  09/21/2016  12:31 PM

## 2016-09-21 NOTE — Telephone Encounter (Signed)
Called Dr. Pennie Banter office: spoke with Estill Bamberg, requested CBC to be faxed to this office after PCP visit next week.

## 2016-09-21 NOTE — Telephone Encounter (Signed)
Avs report and appointment schedule given to patient, per 09/21/16 los

## 2016-09-22 ENCOUNTER — Telehealth: Payer: Self-pay | Admitting: *Deleted

## 2016-09-22 LAB — CEA: CEA: 2.7 ng/mL (ref 0.0–4.7)

## 2016-09-22 NOTE — Telephone Encounter (Signed)
Pt notified of CEA result. She voiced appreciation for call.

## 2016-09-22 NOTE — Telephone Encounter (Signed)
-----   Message from Ladell Pier, MD sent at 09/21/2016  4:55 PM EDT ----- Please call patient, cea is normal

## 2016-09-28 ENCOUNTER — Encounter: Payer: Self-pay | Admitting: *Deleted

## 2016-09-28 DIAGNOSIS — D649 Anemia, unspecified: Secondary | ICD-10-CM | POA: Diagnosis not present

## 2016-09-28 NOTE — Progress Notes (Signed)
Patient in lobby and states that she was in Dr. Pennie Banter office this AM and Gay Filler from Dr. Pennie Banter office informed her that there was not an order for CBC results to be faxed to [facility].  Call placed to Dr. Pennie Banter office and spoke with Dr. Shelia Media.  CBC was complete this AM at his office and faxed to [facility] by Dr. Shelia Media.

## 2016-10-02 DIAGNOSIS — J449 Chronic obstructive pulmonary disease, unspecified: Secondary | ICD-10-CM | POA: Diagnosis not present

## 2016-10-02 DIAGNOSIS — Z23 Encounter for immunization: Secondary | ICD-10-CM | POA: Diagnosis not present

## 2016-10-02 DIAGNOSIS — D649 Anemia, unspecified: Secondary | ICD-10-CM | POA: Diagnosis not present

## 2016-10-02 DIAGNOSIS — R079 Chest pain, unspecified: Secondary | ICD-10-CM | POA: Diagnosis not present

## 2016-10-02 DIAGNOSIS — R0602 Shortness of breath: Secondary | ICD-10-CM | POA: Diagnosis not present

## 2016-10-05 DIAGNOSIS — R9431 Abnormal electrocardiogram [ECG] [EKG]: Secondary | ICD-10-CM | POA: Diagnosis not present

## 2016-10-05 DIAGNOSIS — R0789 Other chest pain: Secondary | ICD-10-CM | POA: Diagnosis not present

## 2016-10-05 DIAGNOSIS — R0602 Shortness of breath: Secondary | ICD-10-CM | POA: Diagnosis not present

## 2016-10-05 DIAGNOSIS — R0600 Dyspnea, unspecified: Secondary | ICD-10-CM | POA: Diagnosis not present

## 2016-10-06 ENCOUNTER — Other Ambulatory Visit: Payer: Self-pay | Admitting: Cardiology

## 2016-10-06 DIAGNOSIS — R0602 Shortness of breath: Secondary | ICD-10-CM

## 2016-10-07 ENCOUNTER — Ambulatory Visit (HOSPITAL_COMMUNITY)
Admission: RE | Admit: 2016-10-07 | Discharge: 2016-10-07 | Disposition: A | Discharge status: Home / Self Care | Payer: Medicare Other | Source: Ambulatory Visit | Attending: Cardiology | Admitting: Cardiology

## 2016-10-07 DIAGNOSIS — R0600 Dyspnea, unspecified: Secondary | ICD-10-CM | POA: Diagnosis present

## 2016-10-07 DIAGNOSIS — I517 Cardiomegaly: Secondary | ICD-10-CM | POA: Insufficient documentation

## 2016-10-07 DIAGNOSIS — R0602 Shortness of breath: Secondary | ICD-10-CM | POA: Insufficient documentation

## 2016-10-07 DIAGNOSIS — R0609 Other forms of dyspnea: Secondary | ICD-10-CM | POA: Diagnosis not present

## 2016-10-07 NOTE — Progress Notes (Signed)
  Echocardiogram 2D Echocardiogram has been performed.  Erica Escobar 10/07/2016, 11:45 AM

## 2016-10-12 DIAGNOSIS — I119 Hypertensive heart disease without heart failure: Secondary | ICD-10-CM | POA: Diagnosis not present

## 2016-10-12 DIAGNOSIS — R0602 Shortness of breath: Secondary | ICD-10-CM | POA: Diagnosis not present

## 2016-10-14 DIAGNOSIS — I519 Heart disease, unspecified: Secondary | ICD-10-CM | POA: Diagnosis not present

## 2016-10-15 DIAGNOSIS — H903 Sensorineural hearing loss, bilateral: Secondary | ICD-10-CM | POA: Diagnosis not present

## 2016-10-15 DIAGNOSIS — H9313 Tinnitus, bilateral: Secondary | ICD-10-CM | POA: Diagnosis not present

## 2016-10-15 DIAGNOSIS — H8143 Vertigo of central origin, bilateral: Secondary | ICD-10-CM | POA: Diagnosis not present

## 2016-10-15 DIAGNOSIS — J320 Chronic maxillary sinusitis: Secondary | ICD-10-CM | POA: Diagnosis not present

## 2016-10-15 DIAGNOSIS — J322 Chronic ethmoidal sinusitis: Secondary | ICD-10-CM | POA: Diagnosis not present

## 2016-10-15 DIAGNOSIS — H7011 Chronic mastoiditis, right ear: Secondary | ICD-10-CM | POA: Diagnosis not present

## 2016-10-16 DIAGNOSIS — M48061 Spinal stenosis, lumbar region without neurogenic claudication: Secondary | ICD-10-CM | POA: Diagnosis not present

## 2016-10-16 DIAGNOSIS — M545 Low back pain: Secondary | ICD-10-CM | POA: Diagnosis not present

## 2016-10-22 DIAGNOSIS — C169 Malignant neoplasm of stomach, unspecified: Secondary | ICD-10-CM | POA: Diagnosis not present

## 2016-11-11 DIAGNOSIS — R0609 Other forms of dyspnea: Secondary | ICD-10-CM | POA: Diagnosis not present

## 2016-11-11 DIAGNOSIS — N390 Urinary tract infection, site not specified: Secondary | ICD-10-CM | POA: Diagnosis not present

## 2016-11-24 DIAGNOSIS — I872 Venous insufficiency (chronic) (peripheral): Secondary | ICD-10-CM | POA: Diagnosis not present

## 2016-11-24 DIAGNOSIS — R0602 Shortness of breath: Secondary | ICD-10-CM | POA: Diagnosis not present

## 2016-11-24 DIAGNOSIS — I119 Hypertensive heart disease without heart failure: Secondary | ICD-10-CM | POA: Diagnosis not present

## 2016-12-03 DIAGNOSIS — R0789 Other chest pain: Secondary | ICD-10-CM | POA: Diagnosis not present

## 2016-12-03 DIAGNOSIS — I119 Hypertensive heart disease without heart failure: Secondary | ICD-10-CM | POA: Diagnosis not present

## 2016-12-03 DIAGNOSIS — R0602 Shortness of breath: Secondary | ICD-10-CM | POA: Diagnosis not present

## 2016-12-16 DIAGNOSIS — D692 Other nonthrombocytopenic purpura: Secondary | ICD-10-CM | POA: Diagnosis not present

## 2016-12-16 DIAGNOSIS — L820 Inflamed seborrheic keratosis: Secondary | ICD-10-CM | POA: Diagnosis not present

## 2016-12-16 DIAGNOSIS — L814 Other melanin hyperpigmentation: Secondary | ICD-10-CM | POA: Diagnosis not present

## 2016-12-16 DIAGNOSIS — L821 Other seborrheic keratosis: Secondary | ICD-10-CM | POA: Diagnosis not present

## 2016-12-16 DIAGNOSIS — L570 Actinic keratosis: Secondary | ICD-10-CM | POA: Diagnosis not present

## 2016-12-16 DIAGNOSIS — D2261 Melanocytic nevi of right upper limb, including shoulder: Secondary | ICD-10-CM | POA: Diagnosis not present

## 2016-12-16 DIAGNOSIS — Z85828 Personal history of other malignant neoplasm of skin: Secondary | ICD-10-CM | POA: Diagnosis not present

## 2016-12-22 DIAGNOSIS — K219 Gastro-esophageal reflux disease without esophagitis: Secondary | ICD-10-CM | POA: Diagnosis not present

## 2016-12-22 DIAGNOSIS — R350 Frequency of micturition: Secondary | ICD-10-CM | POA: Diagnosis not present

## 2016-12-22 DIAGNOSIS — J0100 Acute maxillary sinusitis, unspecified: Secondary | ICD-10-CM | POA: Diagnosis not present

## 2016-12-25 DIAGNOSIS — S0502XA Injury of conjunctiva and corneal abrasion without foreign body, left eye, initial encounter: Secondary | ICD-10-CM | POA: Diagnosis not present

## 2016-12-26 DIAGNOSIS — S0502XD Injury of conjunctiva and corneal abrasion without foreign body, left eye, subsequent encounter: Secondary | ICD-10-CM | POA: Diagnosis not present

## 2016-12-28 DIAGNOSIS — S0502XS Injury of conjunctiva and corneal abrasion without foreign body, left eye, sequela: Secondary | ICD-10-CM | POA: Diagnosis not present

## 2017-02-17 DIAGNOSIS — M810 Age-related osteoporosis without current pathological fracture: Secondary | ICD-10-CM | POA: Diagnosis not present

## 2017-03-01 DIAGNOSIS — M810 Age-related osteoporosis without current pathological fracture: Secondary | ICD-10-CM | POA: Diagnosis not present

## 2017-03-17 DIAGNOSIS — L570 Actinic keratosis: Secondary | ICD-10-CM | POA: Diagnosis not present

## 2017-03-17 DIAGNOSIS — L821 Other seborrheic keratosis: Secondary | ICD-10-CM | POA: Diagnosis not present

## 2017-03-17 DIAGNOSIS — L814 Other melanin hyperpigmentation: Secondary | ICD-10-CM | POA: Diagnosis not present

## 2017-03-17 DIAGNOSIS — D485 Neoplasm of uncertain behavior of skin: Secondary | ICD-10-CM | POA: Diagnosis not present

## 2017-03-17 DIAGNOSIS — Z85828 Personal history of other malignant neoplasm of skin: Secondary | ICD-10-CM | POA: Diagnosis not present

## 2017-03-17 DIAGNOSIS — C44519 Basal cell carcinoma of skin of other part of trunk: Secondary | ICD-10-CM | POA: Diagnosis not present

## 2017-03-22 ENCOUNTER — Telehealth: Payer: Self-pay | Admitting: Oncology

## 2017-03-22 ENCOUNTER — Ambulatory Visit (HOSPITAL_BASED_OUTPATIENT_CLINIC_OR_DEPARTMENT_OTHER): Payer: Medicare Other | Admitting: Nurse Practitioner

## 2017-03-22 ENCOUNTER — Other Ambulatory Visit (HOSPITAL_BASED_OUTPATIENT_CLINIC_OR_DEPARTMENT_OTHER): Payer: Medicare Other

## 2017-03-22 VITALS — BP 129/82 | HR 77 | Temp 98.2°F | Resp 18 | Ht 63.0 in | Wt 171.5 lb

## 2017-03-22 DIAGNOSIS — Z85038 Personal history of other malignant neoplasm of large intestine: Secondary | ICD-10-CM

## 2017-03-22 DIAGNOSIS — C182 Malignant neoplasm of ascending colon: Secondary | ICD-10-CM

## 2017-03-22 LAB — CEA (IN HOUSE-CHCC): CEA (CHCC-In House): 1.47 ng/mL (ref 0.00–5.00)

## 2017-03-22 NOTE — Progress Notes (Signed)
  [place] OFFICE PROGRESS NOTE   Diagnosis:  Colon cancer  INTERVAL HISTORY:   Erica Escobar returns as scheduled. Overall she feels good. No change in bowel habits. No bloody or black stools. She has occasional abdominal pain. Dyspnea on exertion is better as compared to 6 months ago. She reports undergoing a negative cardiac evaluation.  Objective:  Vital signs in last 24 hours:  Blood pressure 129/82, pulse 77, temperature 98.2 F (36.8 C), temperature source Oral, resp. rate 18, height 5' 3"  (1.6 m), weight 171 lb 8 oz (77.8 kg), SpO2 98 %.    HEENT: Neck without mass. Lymphatics: No palpable cervical, supra clavicular, axillary or inguinal lymph nodes. Resp: Lungs clear bilaterally. Cardio: Regular rate and rhythm. GI: Abdomen soft and nontender. No hepatomegaly. Vascular: No leg edema.   Lab Results:  Lab Results  Component Value Date   WBC 6.2 03/20/2016   HGB 12.3 03/20/2016   HCT 35.5 03/20/2016   MCV 91.8 03/20/2016   PLT 263 03/20/2016   NEUTROABS 3.9 03/20/2016    Imaging:  No results found.  Medications: I have reviewed the patient's current medications.  Assessment/Plan: 1. 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 Loss of MLH1 and PMS2, MSI-high, BRAF mutation detected ? Colonoscopy February 2017  2. Anemia-likely secondary to bleeding from the colon cancer and surgery. Hemoglobin in normal range 03/20/2016 3. Ascending colon "polyps" removed on the colonoscopy 12/22/2014 with the pathology revealing poorly differentiated adenocarcinoma and a sessile serrated adenoma polyp, similar to the pathology on the hepatic flexure mass 4. History of multiple skin cancers. Seen by dermatology every 3 months. 5. Glaucoma    Disposition:Erica Escobar remains in clinical remission from colon cancer. We will follow-up on the CEA from today. She will return for a  follow-up visit and CEA in 6 months. She will contact the office in the interim with any problems.    Ned Card ANP/GNP-BC   03/22/2017  9:58 AM

## 2017-03-22 NOTE — Telephone Encounter (Signed)
Gave patient AVS and calender per 4/9 los.

## 2017-03-23 ENCOUNTER — Telehealth: Payer: Self-pay | Admitting: *Deleted

## 2017-03-23 LAB — CEA: CEA1: 2.4 ng/mL (ref 0.0–4.7)

## 2017-03-23 NOTE — Telephone Encounter (Signed)
Telephone call to patient advised lab results are normal. Pt verbalized an understanding and appreciated the call.

## 2017-03-23 NOTE — Telephone Encounter (Signed)
-----   Message from Ladell Pier, MD sent at 03/23/2017  6:47 AM EDT ----- Please call patient, cea is normal

## 2017-04-05 DIAGNOSIS — E559 Vitamin D deficiency, unspecified: Secondary | ICD-10-CM | POA: Diagnosis not present

## 2017-04-05 DIAGNOSIS — R252 Cramp and spasm: Secondary | ICD-10-CM | POA: Diagnosis not present

## 2017-04-05 DIAGNOSIS — I10 Essential (primary) hypertension: Secondary | ICD-10-CM | POA: Diagnosis not present

## 2017-04-05 DIAGNOSIS — R600 Localized edema: Secondary | ICD-10-CM | POA: Diagnosis not present

## 2017-04-05 DIAGNOSIS — D509 Iron deficiency anemia, unspecified: Secondary | ICD-10-CM | POA: Diagnosis not present

## 2017-04-05 DIAGNOSIS — Z0000 Encounter for general adult medical examination without abnormal findings: Secondary | ICD-10-CM | POA: Diagnosis not present

## 2017-04-05 DIAGNOSIS — M810 Age-related osteoporosis without current pathological fracture: Secondary | ICD-10-CM | POA: Diagnosis not present

## 2017-04-08 DIAGNOSIS — E785 Hyperlipidemia, unspecified: Secondary | ICD-10-CM | POA: Diagnosis not present

## 2017-04-08 DIAGNOSIS — I10 Essential (primary) hypertension: Secondary | ICD-10-CM | POA: Diagnosis not present

## 2017-04-08 DIAGNOSIS — R350 Frequency of micturition: Secondary | ICD-10-CM | POA: Diagnosis not present

## 2017-04-08 DIAGNOSIS — Z85038 Personal history of other malignant neoplasm of large intestine: Secondary | ICD-10-CM | POA: Diagnosis not present

## 2017-04-08 DIAGNOSIS — J449 Chronic obstructive pulmonary disease, unspecified: Secondary | ICD-10-CM | POA: Diagnosis not present

## 2017-04-15 ENCOUNTER — Other Ambulatory Visit (HOSPITAL_COMMUNITY): Payer: Self-pay | Admitting: Respiratory Therapy

## 2017-04-15 DIAGNOSIS — J449 Chronic obstructive pulmonary disease, unspecified: Secondary | ICD-10-CM

## 2017-04-19 ENCOUNTER — Ambulatory Visit (HOSPITAL_COMMUNITY)
Admission: RE | Admit: 2017-04-19 | Discharge: 2017-04-19 | Disposition: A | Discharge status: Home / Self Care | Payer: Medicare Other | Source: Ambulatory Visit | Attending: Internal Medicine | Admitting: Internal Medicine

## 2017-04-19 DIAGNOSIS — J449 Chronic obstructive pulmonary disease, unspecified: Secondary | ICD-10-CM | POA: Diagnosis not present

## 2017-04-19 MED ORDER — ALBUTEROL SULFATE (2.5 MG/3ML) 0.083% IN NEBU
2.5000 mg | INHALATION_SOLUTION | Freq: Once | RESPIRATORY_TRACT | Status: AC
  Administered 2017-04-19: 2.5 mg via RESPIRATORY_TRACT

## 2017-04-22 LAB — PULMONARY FUNCTION TEST
DL/VA % pred: 88 %
DL/VA: 4 ml/min/mmHg/L
DLCO UNC: 9.74 ml/min/mmHg
DLCO unc % pred: 45 %
FEF 25-75 Post: 0.86 L/sec
FEF 25-75 Pre: 1.38 L/sec
FEF2575-%Change-Post: -37 %
FEF2575-%Pred-Post: 63 %
FEF2575-%Pred-Pre: 101 %
FEV1-%Change-Post: -9 %
FEV1-%PRED-PRE: 87 %
FEV1-%Pred-Post: 79 %
FEV1-POST: 1.43 L
FEV1-Pre: 1.57 L
FEV1FVC-%CHANGE-POST: 3 %
FEV1FVC-%Pred-Pre: 105 %
FEV6-%CHANGE-POST: -12 %
FEV6-%PRED-PRE: 88 %
FEV6-%Pred-Post: 77 %
FEV6-PRE: 2.02 L
FEV6-Post: 1.77 L
FEV6FVC-%Change-Post: 0 %
FEV6FVC-%PRED-POST: 105 %
FEV6FVC-%PRED-PRE: 105 %
FVC-%Change-Post: -12 %
FVC-%Pred-Post: 73 %
FVC-%Pred-Pre: 83 %
FVC-PRE: 2.02 L
FVC-Post: 1.77 L
POST FEV1/FVC RATIO: 81 %
POST FEV6/FVC RATIO: 100 %
Pre FEV1/FVC ratio: 78 %
Pre FEV6/FVC Ratio: 100 %
RV % PRED: 113 %
RV: 2.56 L
TLC % PRED: 92 %
TLC: 4.4 L

## 2017-04-26 DIAGNOSIS — R0602 Shortness of breath: Secondary | ICD-10-CM | POA: Diagnosis not present

## 2017-06-21 DIAGNOSIS — Z85828 Personal history of other malignant neoplasm of skin: Secondary | ICD-10-CM | POA: Diagnosis not present

## 2017-06-21 DIAGNOSIS — L814 Other melanin hyperpigmentation: Secondary | ICD-10-CM | POA: Diagnosis not present

## 2017-06-21 DIAGNOSIS — D0471 Carcinoma in situ of skin of right lower limb, including hip: Secondary | ICD-10-CM | POA: Diagnosis not present

## 2017-06-21 DIAGNOSIS — D225 Melanocytic nevi of trunk: Secondary | ICD-10-CM | POA: Diagnosis not present

## 2017-06-21 DIAGNOSIS — L570 Actinic keratosis: Secondary | ICD-10-CM | POA: Diagnosis not present

## 2017-06-21 DIAGNOSIS — L821 Other seborrheic keratosis: Secondary | ICD-10-CM | POA: Diagnosis not present

## 2017-06-21 DIAGNOSIS — L218 Other seborrheic dermatitis: Secondary | ICD-10-CM | POA: Diagnosis not present

## 2017-09-22 DIAGNOSIS — H11423 Conjunctival edema, bilateral: Secondary | ICD-10-CM | POA: Diagnosis not present

## 2017-09-22 DIAGNOSIS — H1820 Unspecified corneal edema: Secondary | ICD-10-CM | POA: Diagnosis not present

## 2017-09-22 DIAGNOSIS — H16143 Punctate keratitis, bilateral: Secondary | ICD-10-CM | POA: Diagnosis not present

## 2017-09-22 DIAGNOSIS — H02824 Cysts of left upper eyelid: Secondary | ICD-10-CM | POA: Diagnosis not present

## 2017-09-22 DIAGNOSIS — H16223 Keratoconjunctivitis sicca, not specified as Sjogren's, bilateral: Secondary | ICD-10-CM | POA: Diagnosis not present

## 2017-09-27 DIAGNOSIS — D225 Melanocytic nevi of trunk: Secondary | ICD-10-CM | POA: Diagnosis not present

## 2017-09-27 DIAGNOSIS — D0471 Carcinoma in situ of skin of right lower limb, including hip: Secondary | ICD-10-CM | POA: Diagnosis not present

## 2017-09-27 DIAGNOSIS — D1801 Hemangioma of skin and subcutaneous tissue: Secondary | ICD-10-CM | POA: Diagnosis not present

## 2017-09-27 DIAGNOSIS — D0472 Carcinoma in situ of skin of left lower limb, including hip: Secondary | ICD-10-CM | POA: Diagnosis not present

## 2017-09-27 DIAGNOSIS — Z85828 Personal history of other malignant neoplasm of skin: Secondary | ICD-10-CM | POA: Diagnosis not present

## 2017-09-27 DIAGNOSIS — L570 Actinic keratosis: Secondary | ICD-10-CM | POA: Diagnosis not present

## 2017-09-27 DIAGNOSIS — L821 Other seborrheic keratosis: Secondary | ICD-10-CM | POA: Diagnosis not present

## 2017-09-27 DIAGNOSIS — L814 Other melanin hyperpigmentation: Secondary | ICD-10-CM | POA: Diagnosis not present

## 2017-09-28 ENCOUNTER — Ambulatory Visit (HOSPITAL_BASED_OUTPATIENT_CLINIC_OR_DEPARTMENT_OTHER): Payer: Medicare Other | Admitting: Oncology

## 2017-09-28 ENCOUNTER — Telehealth: Payer: Self-pay | Admitting: Nurse Practitioner

## 2017-09-28 ENCOUNTER — Other Ambulatory Visit (HOSPITAL_BASED_OUTPATIENT_CLINIC_OR_DEPARTMENT_OTHER): Payer: Medicare Other

## 2017-09-28 ENCOUNTER — Telehealth: Payer: Self-pay | Admitting: Emergency Medicine

## 2017-09-28 VITALS — BP 122/71 | HR 85 | Temp 98.5°F | Resp 19 | Ht 63.0 in | Wt 170.3 lb

## 2017-09-28 DIAGNOSIS — D649 Anemia, unspecified: Secondary | ICD-10-CM

## 2017-09-28 DIAGNOSIS — M545 Low back pain: Secondary | ICD-10-CM

## 2017-09-28 DIAGNOSIS — C182 Malignant neoplasm of ascending colon: Secondary | ICD-10-CM

## 2017-09-28 DIAGNOSIS — Z9181 History of falling: Secondary | ICD-10-CM

## 2017-09-28 LAB — CEA (IN HOUSE-CHCC): CEA (CHCC-In House): 2.23 ng/mL (ref 0.00–5.00)

## 2017-09-28 NOTE — Telephone Encounter (Signed)
Gave avs and calendar for April 2019

## 2017-09-28 NOTE — Telephone Encounter (Addendum)
Pt verbalized understanding of this note.    ----- Message from Ladell Pier, MD sent at 09/28/2017  3:28 PM EDT ----- Please call patient, cea is normal, f/u as scheduled

## 2017-09-28 NOTE — Progress Notes (Signed)
  [place] OFFICE PROGRESS NOTE   Diagnosis: colon cancer  INTERVAL HISTORY:   Erica Escobar returns as scheduled.she recently fell and injured her back. She has discomfort at the lower back.She has pain after a firm bowel movement. No bleeding. She had multiple skin lesions removed by Dr. Elvera Lennox yesterday. Objective:  Vital signs in last 24 hours:  Blood pressure 122/71, pulse 85, temperature 98.5 F (36.9 C), temperature source Oral, resp. rate 19, height _0  (1.6 m), weight 170 lb 4.8 oz (77.2 kg), SpO2 100 %.    HEENT: neck without mass Lymphatics: no cervical, supraclavicular, axillary, or inguinal nodes Resp: lungs clear bilaterally Cardio: regular rate and rhythm GI: no hepatosplenomegaly, no mass, nontender Vascular: no leg edema   Lab Results:  Lab Results  Component Value Date   CEA1 2.23 09/28/2017     Medications: I have reviewed the patient's current medications.  Assessment/Plan: 1. 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 Loss of MLH1 and PMS2, MSI-high, BRAF mutation detected ? Colonoscopy February 2017  2. Anemia-likely secondary to bleeding from the colon cancer and surgery. Hemoglobin in normal range 03/20/2016 3. Ascending colon "polyps" removed on the colonoscopy 12/22/2014 with the pathology revealing poorly differentiated adenocarcinoma and a sessile serrated adenoma polyp, similar to the pathology on the hepatic flexure mass 4. History of multiple skin cancers. Seen by dermatology every 3 months. 5. Glaucoma    Disposition:  Erica Escobar remains in clinical remission from colon cancer. She will return for an office visit and CEA in 6 months.  15 minutes were spent with the patient today. The majority of the time was used for counseling and coordination of care.  Donneta Romberg, MD  09/28/2017  12:14 PM

## 2017-10-08 DIAGNOSIS — N184 Chronic kidney disease, stage 4 (severe): Secondary | ICD-10-CM | POA: Diagnosis not present

## 2017-10-08 DIAGNOSIS — I10 Essential (primary) hypertension: Secondary | ICD-10-CM | POA: Diagnosis not present

## 2017-10-08 DIAGNOSIS — D649 Anemia, unspecified: Secondary | ICD-10-CM | POA: Diagnosis not present

## 2017-10-08 DIAGNOSIS — M545 Low back pain: Secondary | ICD-10-CM | POA: Diagnosis not present

## 2017-10-08 DIAGNOSIS — Z23 Encounter for immunization: Secondary | ICD-10-CM | POA: Diagnosis not present

## 2017-10-14 DIAGNOSIS — G8929 Other chronic pain: Secondary | ICD-10-CM | POA: Diagnosis not present

## 2017-10-14 DIAGNOSIS — M5442 Lumbago with sciatica, left side: Secondary | ICD-10-CM | POA: Diagnosis not present

## 2017-10-14 DIAGNOSIS — M5090 Cervical disc disorder, unspecified, unspecified cervical region: Secondary | ICD-10-CM | POA: Diagnosis not present

## 2017-10-14 DIAGNOSIS — M48061 Spinal stenosis, lumbar region without neurogenic claudication: Secondary | ICD-10-CM | POA: Diagnosis not present

## 2017-10-24 ENCOUNTER — Emergency Department (HOSPITAL_COMMUNITY)
Admission: EM | Admit: 2017-10-24 | Discharge: 2017-10-25 | Disposition: A | Discharge status: Home / Self Care | Payer: Medicare Other | Attending: Emergency Medicine | Admitting: Emergency Medicine

## 2017-10-24 ENCOUNTER — Other Ambulatory Visit: Payer: Self-pay

## 2017-10-24 ENCOUNTER — Encounter (HOSPITAL_COMMUNITY): Payer: Self-pay | Admitting: Emergency Medicine

## 2017-10-24 DIAGNOSIS — M5440 Lumbago with sciatica, unspecified side: Secondary | ICD-10-CM | POA: Insufficient documentation

## 2017-10-24 DIAGNOSIS — Z7982 Long term (current) use of aspirin: Secondary | ICD-10-CM | POA: Insufficient documentation

## 2017-10-24 DIAGNOSIS — I10 Essential (primary) hypertension: Secondary | ICD-10-CM | POA: Diagnosis not present

## 2017-10-24 DIAGNOSIS — Z79899 Other long term (current) drug therapy: Secondary | ICD-10-CM | POA: Insufficient documentation

## 2017-10-24 DIAGNOSIS — J449 Chronic obstructive pulmonary disease, unspecified: Secondary | ICD-10-CM | POA: Diagnosis not present

## 2017-10-24 DIAGNOSIS — M5489 Other dorsalgia: Secondary | ICD-10-CM | POA: Diagnosis not present

## 2017-10-24 DIAGNOSIS — G8929 Other chronic pain: Secondary | ICD-10-CM

## 2017-10-24 DIAGNOSIS — R030 Elevated blood-pressure reading, without diagnosis of hypertension: Secondary | ICD-10-CM | POA: Diagnosis not present

## 2017-10-24 DIAGNOSIS — Z87891 Personal history of nicotine dependence: Secondary | ICD-10-CM | POA: Insufficient documentation

## 2017-10-24 DIAGNOSIS — M549 Dorsalgia, unspecified: Secondary | ICD-10-CM | POA: Diagnosis present

## 2017-10-24 MED ORDER — TRAMADOL HCL 50 MG PO TABS
50.0000 mg | ORAL_TABLET | Freq: Once | ORAL | Status: AC
Start: 2017-10-24 — End: 2017-10-24
  Administered 2017-10-24: 50 mg via ORAL
  Filled 2017-10-24: qty 1

## 2017-10-24 MED ORDER — ONDANSETRON 4 MG PO TBDP
4.0000 mg | ORAL_TABLET | Freq: Once | ORAL | Status: AC
  Administered 2017-10-24: 4 mg via ORAL
  Filled 2017-10-24: qty 1

## 2017-10-24 NOTE — ED Provider Notes (Signed)
[facility] DEPT Provider Note   CSN: [id number] Arrival date & time: 10/24/17  2011     History   Chief Complaint Chief Complaint  Patient presents with  . Back Pain    HPI Erica Escobar is a 81 y.o. female.  81yo F w/ PMH including COPD, HTN, previous colon CA, arthritis who p/w back pain.  Patient states that she has had long-standing back pain but she had a fall on 08/18/17 after which her back pain has been worse.  She followed up with an orthopedic doctor for her symptoms and at the beginning of November had x-rays done of her back.  He started her on prednisone.  Her primary care provider also switched her from Aleve to tramadol.  She takes 1/2 tablet of tramadol every 6 hours.  She reports that her back pain starts in her right lower back and then radiates up her back.  She occasionally has tingling down her right leg.  No incontinence, saddle anesthesia, or leg weakness although she states when the pain becomes severe she has to sit down.  This evening her back pain flared up again and she took half of a tramadol tablet around 615 with no relief of her symptoms so she called EMS. No fevers or abdominal pain. No urinary sx.   The history is provided by the patient.  Back Pain      Past Medical History:  Diagnosis Date  . Abnormal tympanic membrane    right ear and mastoid problems  . Arthritis    oseoarthritis  . Cancer of ascending colon ([place]) 02/15/2015  . Constipation, chronic   . COPD (chronic obstructive pulmonary disease) (HCC)    mild COPD  . Depression   . Diverticulosis   . GERD (gastroesophageal reflux disease)   . Glaucoma    bilateral-sugery with laser, using eye drops  . Hemorrhoids    Internal  . History of hiatal hernia    dx. '87 Schatzki's ring  . Hyperlipidemia    good control  . Hypertension   . Insomnia    hx. of  . Keratosis, actinic   . Leg cramps   . Macular degeneration   . Neuromuscular disorder (HCC)     neuropathy legs,   . On aspirin at home    chronic use  . PONV (postoperative nausea and vomiting)   . Restless leg syndrome   . Schatzki's ring 1987  . Skin cancer    multiple, "tx. area right anterior wrist"  . Urinary incontinence    occ, stress    Patient Active Problem List   Diagnosis Date Noted  . Cancer of ascending colon s/p robotic colectomy 02/15/2015 02/15/2015  . Restless leg syndrome   . Depression   . Insomnia   . COPD (chronic obstructive pulmonary disease) ([place])   . GERD (gastroesophageal reflux disease)     Past Surgical History:  Procedure Laterality Date  . ABDOMINAL HYSTERECTOMY    . BLADDER REPAIR    . CARDIAC CATHETERIZATION  2007, 2009  . CATARACT EXTRACTION Bilateral   . CHOLECYSTECTOMY    . COLECTOMY    . FINGER NAIL SURGERY    . FOOT SURGERY Right   . HEMORROIDECTOMY    . INNER EAR SURGERY Right    3-4 times  . INTRAOPERATIVE ARTERIOGRAM     right arm  . KNEE SURGERY Right   . LEG SURGERY Bilateral    laser ablation  . MASTOID DEBRIDEMENT  x2  . SKIN CANCER EXCISION     right/left thumb nail, left face, right ear/neck, right leg  . TOE FUSION    . TONSILLECTOMY      OB History    No data available       Home Medications    Prior to Admission medications   Medication Sig Start Date End Date Taking? Authorizing Provider  acetaminophen (TYLENOL) 325 MG tablet Take 650 mg by mouth every 6 (six) hours as needed.    [provider]  amLODipine (NORVASC) 5 MG tablet Take 5 mg by mouth every morning.    [provider]  aspirin EC 81 MG tablet Take 81 mg by mouth every morning.    [provider]  beta carotene w/minerals (OCUVITE) tablet Take 1 tablet by mouth daily.     [provider]  calcium-vitamin D (OSCAL) 250-125 MG-UNIT per tablet Take 1 tablet by mouth every morning.    [provider]  Cyanocobalamin (VITAMIN B 12 PO) Take 1 tablet by mouth every morning.    [provider]  furosemide (LASIX) 40 MG tablet Take 20 mg by mouth every morning.    [provider]  gemfibrozil (LOPID) 600 MG tablet Take 600 mg by mouth every morning.    [provider]  loratadine (CLARITIN) 10 MG tablet Take 10 mg by mouth daily as needed for allergies.    [provider]  Multiple Vitamin (MULTIVITAMIN WITH MINERALS) TABS tablet Take 1 tablet by mouth every morning.    [provider]  naproxen sodium (ANAPROX) 220 MG tablet Take 440 mg by mouth every morning.    [provider]  Omega-3 Fatty Acids (FISH OIL PO) Take 2 capsules by mouth every morning.    [provider]  OVER THE COUNTER MEDICATION Place 1 drop into both eyes 2 (two) times daily. Smoothe II    [provider]  pantoprazole (PROTONIX) 40 MG tablet Take 40 mg by mouth daily. 03/09/17   [provider]  polyethylene glycol (MIRALAX / GLYCOLAX) packet Take 17 g by mouth at bedtime.    [provider]  vitamin C (ASCORBIC ACID) 500 MG tablet Take 500 mg by mouth daily. Will not take prior to surgery    [provider]    Family History History reviewed. No pertinent family history.  Social History Social History   Tobacco Use  . Smoking status: Former Research scientist (life sciences)  . Smokeless tobacco: Never Used  . Tobacco comment: short time x1 - during pregnancy  Substance Use Topics  . Alcohol use: No    Alcohol/week: 0.0 oz  . Drug use: No     Allergies   Codeine; Gabapentin; Niaspan [niacin]; Oxybutynin; Paxil [paroxetine hcl]; Penicillins; Pentazocine; Tizanidine; and Vicodin [hydrocodone-acetaminophen]   Review of Systems Review of Systems  Musculoskeletal: Positive for back pain.   All other systems reviewed and are negative except that which was mentioned in HPI   Physical Exam Updated Vital Signs BP (!) 141/89   Pulse 80   Temp 98.8 F (37.1 C) (Oral)   Resp 16   Ht 5' 3"  (1.6 m)   Wt 77.1 kg (170 lb)    SpO2 97%   BMI 30.11 kg/m   Physical Exam  Constitutional: She is oriented to person, place, and time. She appears well-developed and well-nourished. No distress.  HENT:  Head: Normocephalic and atraumatic.  Moist mucous membranes  Eyes: Conjunctivae are normal. Pupils are equal, round,  and reactive to light.  Neck: Neck supple.  Cardiovascular: Normal rate, regular rhythm and normal heart sounds.  No murmur heard. Pulmonary/Chest: Effort normal and breath sounds normal.  Abdominal: Soft. Bowel sounds are normal. She exhibits no distension. There is no tenderness.  Musculoskeletal: She exhibits no edema or tenderness.  No focal back tenderness  Neurological: She is alert and oriented to person, place, and time. She displays normal reflexes. She exhibits normal muscle tone.  Fluent speech 5/5 strength and normal sensation BLE No clonus Slow steady gait  Skin: Skin is warm and dry. No rash noted.  Psychiatric: She has a normal mood and affect. Judgment normal.  Nursing note and vitals reviewed.    ED Treatments / Results  Labs (all labs ordered are listed, but only abnormal results are displayed) Labs Reviewed - No data to display  EKG  EKG Interpretation None       Radiology No results found.  Procedures Procedures (including critical care time)  Medications Ordered in ED Medications  traMADol (ULTRAM) tablet 50 mg (50 mg Oral Given 10/24/17 2219)  ondansetron (ZOFRAN-ODT) disintegrating tablet 4 mg (4 mg Oral Given 10/24/17 2219)     Initial Impression / Assessment and Plan / ED Course  I have reviewed the triage vital signs and the nursing notes.      PT w/ acute flare of back pain that she has been dealing with chronically, worse after a fall 2 months ago. Recent XR done at ortho office, on prednisone and tramadol. Well appearing on exam, no midline tenderness. No recent trauma and given she's had previous imaging for the same symptoms, I do not feel  repeat imaging would be of benefit to her today.  I explained that I can try to improve her pain tonight but would not be able to offer definitive management, rather she will have to follow-up with orthopedics for ongoing symptom control. Gave tramadol (allergy to vicodin) after which she was able to ambulate independently without problems. Encouraged her to take 1 tramadol rather than 1/2 tablet if her pain is severe. No signs/sx to suggest infection or cauda equina. Return precautions given.  Final Clinical Impressions(s) / ED Diagnoses   Final diagnoses:  Chronic low back pain with sciatica, sciatica laterality unspecified, unspecified back pain laterality    ED Discharge Orders    None       Little, Wenda Overland, MD 10/24/17 [phone]

## 2017-10-24 NOTE — Discharge Instructions (Signed)
YOU MAY TAKE 1 TABLET OF TRAMADOL EVERY 6 HOURS IF YOU NEED TO FOR SEVERE PAIN. RETURN TO ER IF YOU HAVE ANY LEG WEAKNESS, NUMBNESS IN YOUR LEGS OR GROIN, OR INABILITY TO GO TO THE BATHROOM. FOLLOW UP WITH YOUR ORTHOPEDIC SPECIALIST.

## 2017-10-24 NOTE — ED Notes (Signed)
Bed: IO27 Expected date:  Expected time:  Means of arrival:  Comments: 81 f back pain

## 2017-10-24 NOTE — ED Notes (Signed)
Pt stated, "my back doesn't hurt as bad as it did before I took that Tramadol."

## 2017-10-24 NOTE — ED Notes (Signed)
Pt has ambulated to and from the bathroom two times without assistance with steady gait.

## 2017-10-24 NOTE — ED Triage Notes (Signed)
Patient called EMS. Patient has hx of back pain. Patient states she only hurts when she moves. Patient states around 6 pm she started getting worse and needed to come to the ED.

## 2017-10-26 DIAGNOSIS — M5090 Cervical disc disorder, unspecified, unspecified cervical region: Secondary | ICD-10-CM | POA: Diagnosis not present

## 2017-10-26 DIAGNOSIS — M48061 Spinal stenosis, lumbar region without neurogenic claudication: Secondary | ICD-10-CM | POA: Diagnosis not present

## 2017-11-10 DIAGNOSIS — R2681 Unsteadiness on feet: Secondary | ICD-10-CM | POA: Diagnosis not present

## 2017-11-10 DIAGNOSIS — R1084 Generalized abdominal pain: Secondary | ICD-10-CM | POA: Diagnosis not present

## 2017-11-10 DIAGNOSIS — J069 Acute upper respiratory infection, unspecified: Secondary | ICD-10-CM | POA: Diagnosis not present

## 2017-11-10 DIAGNOSIS — R260 Ataxic gait: Secondary | ICD-10-CM | POA: Diagnosis not present

## 2017-11-10 DIAGNOSIS — K625 Hemorrhage of anus and rectum: Secondary | ICD-10-CM | POA: Diagnosis not present

## 2017-11-15 DIAGNOSIS — R1310 Dysphagia, unspecified: Secondary | ICD-10-CM | POA: Diagnosis not present

## 2017-11-15 DIAGNOSIS — K625 Hemorrhage of anus and rectum: Secondary | ICD-10-CM | POA: Diagnosis not present

## 2017-11-15 DIAGNOSIS — Z85038 Personal history of other malignant neoplasm of large intestine: Secondary | ICD-10-CM | POA: Diagnosis not present

## 2017-11-17 DIAGNOSIS — Z01810 Encounter for preprocedural cardiovascular examination: Secondary | ICD-10-CM | POA: Diagnosis not present

## 2017-11-17 DIAGNOSIS — E785 Hyperlipidemia, unspecified: Secondary | ICD-10-CM | POA: Diagnosis not present

## 2017-11-17 DIAGNOSIS — I119 Hypertensive heart disease without heart failure: Secondary | ICD-10-CM | POA: Diagnosis not present

## 2017-11-17 DIAGNOSIS — I872 Venous insufficiency (chronic) (peripheral): Secondary | ICD-10-CM | POA: Diagnosis not present

## 2017-12-03 DIAGNOSIS — Z791 Long term (current) use of non-steroidal anti-inflammatories (NSAID): Secondary | ICD-10-CM | POA: Diagnosis not present

## 2017-12-03 DIAGNOSIS — D649 Anemia, unspecified: Secondary | ICD-10-CM | POA: Diagnosis not present

## 2017-12-03 DIAGNOSIS — I129 Hypertensive chronic kidney disease with stage 1 through stage 4 chronic kidney disease, or unspecified chronic kidney disease: Secondary | ICD-10-CM | POA: Diagnosis not present

## 2017-12-03 DIAGNOSIS — K219 Gastro-esophageal reflux disease without esophagitis: Secondary | ICD-10-CM | POA: Diagnosis not present

## 2017-12-03 DIAGNOSIS — N184 Chronic kidney disease, stage 4 (severe): Secondary | ICD-10-CM | POA: Diagnosis not present

## 2017-12-08 ENCOUNTER — Other Ambulatory Visit: Payer: Self-pay | Admitting: Nephrology

## 2017-12-08 DIAGNOSIS — I129 Hypertensive chronic kidney disease with stage 1 through stage 4 chronic kidney disease, or unspecified chronic kidney disease: Secondary | ICD-10-CM

## 2017-12-08 DIAGNOSIS — N184 Chronic kidney disease, stage 4 (severe): Secondary | ICD-10-CM

## 2017-12-15 DIAGNOSIS — H26493 Other secondary cataract, bilateral: Secondary | ICD-10-CM | POA: Diagnosis not present

## 2017-12-15 DIAGNOSIS — H353131 Nonexudative age-related macular degeneration, bilateral, early dry stage: Secondary | ICD-10-CM | POA: Diagnosis not present

## 2017-12-15 DIAGNOSIS — H16223 Keratoconjunctivitis sicca, not specified as Sjogren's, bilateral: Secondary | ICD-10-CM | POA: Diagnosis not present

## 2017-12-15 DIAGNOSIS — H40013 Open angle with borderline findings, low risk, bilateral: Secondary | ICD-10-CM | POA: Diagnosis not present

## 2017-12-15 DIAGNOSIS — H04123 Dry eye syndrome of bilateral lacrimal glands: Secondary | ICD-10-CM | POA: Diagnosis not present

## 2017-12-16 DIAGNOSIS — Z85038 Personal history of other malignant neoplasm of large intestine: Secondary | ICD-10-CM | POA: Diagnosis not present

## 2017-12-16 DIAGNOSIS — R1310 Dysphagia, unspecified: Secondary | ICD-10-CM | POA: Diagnosis not present

## 2017-12-16 DIAGNOSIS — D123 Benign neoplasm of transverse colon: Secondary | ICD-10-CM | POA: Diagnosis not present

## 2017-12-16 DIAGNOSIS — K635 Polyp of colon: Secondary | ICD-10-CM | POA: Diagnosis not present

## 2017-12-16 DIAGNOSIS — K210 Gastro-esophageal reflux disease with esophagitis: Secondary | ICD-10-CM | POA: Diagnosis not present

## 2017-12-16 DIAGNOSIS — K5730 Diverticulosis of large intestine without perforation or abscess without bleeding: Secondary | ICD-10-CM | POA: Diagnosis not present

## 2017-12-16 DIAGNOSIS — D125 Benign neoplasm of sigmoid colon: Secondary | ICD-10-CM | POA: Diagnosis not present

## 2017-12-16 DIAGNOSIS — K208 Other esophagitis: Secondary | ICD-10-CM | POA: Diagnosis not present

## 2017-12-21 DIAGNOSIS — M5432 Sciatica, left side: Secondary | ICD-10-CM | POA: Diagnosis not present

## 2017-12-21 DIAGNOSIS — M5431 Sciatica, right side: Secondary | ICD-10-CM | POA: Diagnosis not present

## 2017-12-22 ENCOUNTER — Other Ambulatory Visit: Payer: Self-pay | Admitting: Internal Medicine

## 2017-12-22 DIAGNOSIS — M5432 Sciatica, left side: Secondary | ICD-10-CM

## 2017-12-24 ENCOUNTER — Ambulatory Visit
Admission: RE | Admit: 2017-12-24 | Discharge: 2017-12-24 | Disposition: A | Discharge status: Home / Self Care | Payer: Medicare Other | Source: Ambulatory Visit | Attending: Nephrology | Admitting: Nephrology

## 2017-12-24 DIAGNOSIS — N184 Chronic kidney disease, stage 4 (severe): Secondary | ICD-10-CM

## 2017-12-24 DIAGNOSIS — I129 Hypertensive chronic kidney disease with stage 1 through stage 4 chronic kidney disease, or unspecified chronic kidney disease: Secondary | ICD-10-CM

## 2017-12-28 DIAGNOSIS — C44629 Squamous cell carcinoma of skin of left upper limb, including shoulder: Secondary | ICD-10-CM | POA: Diagnosis not present

## 2017-12-28 DIAGNOSIS — C44221 Squamous cell carcinoma of skin of unspecified ear and external auricular canal: Secondary | ICD-10-CM | POA: Diagnosis not present

## 2017-12-28 DIAGNOSIS — D0461 Carcinoma in situ of skin of right upper limb, including shoulder: Secondary | ICD-10-CM | POA: Diagnosis not present

## 2017-12-28 DIAGNOSIS — C44622 Squamous cell carcinoma of skin of right upper limb, including shoulder: Secondary | ICD-10-CM | POA: Diagnosis not present

## 2017-12-28 DIAGNOSIS — L570 Actinic keratosis: Secondary | ICD-10-CM | POA: Diagnosis not present

## 2017-12-28 DIAGNOSIS — L814 Other melanin hyperpigmentation: Secondary | ICD-10-CM | POA: Diagnosis not present

## 2017-12-28 DIAGNOSIS — D1801 Hemangioma of skin and subcutaneous tissue: Secondary | ICD-10-CM | POA: Diagnosis not present

## 2017-12-28 DIAGNOSIS — Z85828 Personal history of other malignant neoplasm of skin: Secondary | ICD-10-CM | POA: Diagnosis not present

## 2017-12-28 DIAGNOSIS — L821 Other seborrheic keratosis: Secondary | ICD-10-CM | POA: Diagnosis not present

## 2017-12-28 DIAGNOSIS — C44229 Squamous cell carcinoma of skin of left ear and external auricular canal: Secondary | ICD-10-CM | POA: Diagnosis not present

## 2017-12-29 ENCOUNTER — Other Ambulatory Visit: Payer: Medicare Other

## 2018-01-03 ENCOUNTER — Encounter (HOSPITAL_COMMUNITY): Payer: Self-pay | Admitting: Emergency Medicine

## 2018-01-03 ENCOUNTER — Emergency Department (HOSPITAL_COMMUNITY): Payer: Medicare Other

## 2018-01-03 ENCOUNTER — Other Ambulatory Visit: Payer: Self-pay

## 2018-01-03 ENCOUNTER — Emergency Department (HOSPITAL_BASED_OUTPATIENT_CLINIC_OR_DEPARTMENT_OTHER)
Admission: EM | Admit: 2018-01-03 | Discharge: 2018-01-03 | Disposition: A | Discharge status: Home / Self Care | Payer: Medicare Other | Source: Home / Self Care

## 2018-01-03 ENCOUNTER — Emergency Department (HOSPITAL_COMMUNITY)
Admission: EM | Admit: 2018-01-03 | Discharge: 2018-01-03 | Disposition: A | Discharge status: Home / Self Care | Payer: Medicare Other | Attending: Emergency Medicine | Admitting: Emergency Medicine

## 2018-01-03 DIAGNOSIS — Z87891 Personal history of nicotine dependence: Secondary | ICD-10-CM | POA: Diagnosis not present

## 2018-01-03 DIAGNOSIS — Z85038 Personal history of other malignant neoplasm of large intestine: Secondary | ICD-10-CM | POA: Diagnosis not present

## 2018-01-03 DIAGNOSIS — Z7982 Long term (current) use of aspirin: Secondary | ICD-10-CM | POA: Insufficient documentation

## 2018-01-03 DIAGNOSIS — M79609 Pain in unspecified limb: Secondary | ICD-10-CM

## 2018-01-03 DIAGNOSIS — I10 Essential (primary) hypertension: Secondary | ICD-10-CM | POA: Diagnosis not present

## 2018-01-03 DIAGNOSIS — M7989 Other specified soft tissue disorders: Secondary | ICD-10-CM

## 2018-01-03 DIAGNOSIS — J449 Chronic obstructive pulmonary disease, unspecified: Secondary | ICD-10-CM | POA: Diagnosis not present

## 2018-01-03 DIAGNOSIS — L03116 Cellulitis of left lower limb: Secondary | ICD-10-CM | POA: Diagnosis not present

## 2018-01-03 DIAGNOSIS — Z85828 Personal history of other malignant neoplasm of skin: Secondary | ICD-10-CM | POA: Diagnosis not present

## 2018-01-03 DIAGNOSIS — R2242 Localized swelling, mass and lump, left lower limb: Secondary | ICD-10-CM | POA: Diagnosis not present

## 2018-01-03 DIAGNOSIS — Z79899 Other long term (current) drug therapy: Secondary | ICD-10-CM | POA: Insufficient documentation

## 2018-01-03 DIAGNOSIS — M79605 Pain in left leg: Secondary | ICD-10-CM | POA: Diagnosis not present

## 2018-01-03 LAB — BASIC METABOLIC PANEL
Anion gap: 11 (ref 5–15)
BUN: 16 mg/dL (ref 6–20)
CALCIUM: 8.9 mg/dL (ref 8.9–10.3)
CO2: 30 mmol/L (ref 22–32)
CREATININE: 1.16 mg/dL — AB (ref 0.44–1.00)
Chloride: 101 mmol/L (ref 101–111)
GFR calc Af Amer: 51 mL/min — ABNORMAL LOW (ref >60)
GFR, EST NON AFRICAN AMERICAN: 44 mL/min — AB (ref >60)
GLUCOSE: 97 mg/dL (ref 65–99)
Potassium: 3.2 mmol/L — ABNORMAL LOW (ref 3.5–5.1)
Sodium: 142 mmol/L (ref 135–145)

## 2018-01-03 LAB — CBC WITH DIFFERENTIAL/PLATELET
BASOS ABS: 0 10*3/uL (ref 0.0–0.1)
Basophils Relative: 0 %
EOS PCT: 0 %
Eosinophils Absolute: 0 10*3/uL (ref 0.0–0.7)
HCT: 30.9 % — ABNORMAL LOW (ref 36.0–46.0)
Hemoglobin: 10.6 g/dL — ABNORMAL LOW (ref 12.0–15.0)
LYMPHS ABS: 0.9 10*3/uL (ref 0.7–4.0)
LYMPHS PCT: 8 %
MCH: 32.4 pg (ref 26.0–34.0)
MCHC: 34.3 g/dL (ref 30.0–36.0)
MCV: 94.5 fL (ref 78.0–100.0)
MONO ABS: 0.7 10*3/uL (ref 0.1–1.0)
Monocytes Relative: 6 %
Neutro Abs: 9.8 10*3/uL — ABNORMAL HIGH (ref 1.7–7.7)
Neutrophils Relative %: 86 %
PLATELETS: 299 10*3/uL (ref 150–400)
RBC: 3.27 MIL/uL — ABNORMAL LOW (ref 3.87–5.11)
RDW: 13.1 % (ref 11.5–15.5)
WBC: 11.5 10*3/uL — ABNORMAL HIGH (ref 4.0–10.5)

## 2018-01-03 LAB — I-STAT CG4 LACTIC ACID, ED: Lactic Acid, Venous: 1.26 mmol/L (ref 0.5–1.9)

## 2018-01-03 MED ORDER — OXYCODONE-ACETAMINOPHEN 5-325 MG PO TABS
1.0000 | ORAL_TABLET | Freq: Once | ORAL | Status: AC
  Administered 2018-01-03: 1 via ORAL
  Filled 2018-01-03: qty 1

## 2018-01-03 MED ORDER — DOXYCYCLINE HYCLATE 100 MG PO CAPS: 100.0000 mg | ORAL_CAPSULE | Freq: Two times a day (BID) | ORAL | 0 refills | Status: DC

## 2018-01-03 NOTE — ED Provider Notes (Signed)
Medical screening examination/treatment/procedure(s) were conducted as a shared visit with non-physician practitioner(s) and myself.  I personally evaluated the patient during the encounter.   EKG Interpretation None     82 year old female complains of pain and swelling with erythema to her mid left foot on the dorsal surface.  Findings consistent with cellulitis.  Will treat with antibiotics.  Had a negative Doppler study of her leg.   Lacretia Leigh, MD 01/03/18 1416

## 2018-01-03 NOTE — ED Triage Notes (Addendum)
Pt complaint of left foot and leg pain onset yesterday; denies injury. Pt feels touch. Denies recent travel or hx of same.

## 2018-01-03 NOTE — Progress Notes (Signed)
Left lower extremity venous duplex has been completed. Negative for DVT. Results were given to Carmon Sails PA.  01/03/18 1:04 PM Carlos Levering RVT

## 2018-01-03 NOTE — ED Notes (Signed)
Bed: WA02 Expected date:  Expected time:  Means of arrival:  Comments: 

## 2018-01-03 NOTE — ED Notes (Signed)
Left foot marked with marker to denote redness area.

## 2018-01-03 NOTE — ED Notes (Signed)
Pt reports that she has been having left foot pain that started yesterday area is red, has some swelling, and is markedly warm to touch. 8/10 throbbing/aching pain. No signs of injury or trauma is noted to the area. Pt is escorted with a friend who is at bedside.

## 2018-01-03 NOTE — Discharge Instructions (Signed)
X-ray showed some age-related arthritis on foot but no fractures. Ultrasound was negative for a blood clot in your leg. Given your symptoms we will treat you for superficial skin infection, cellulitis. Take the medications as prescribed. For pain, can take 1000 mg of Tylenol every 8 hours. Take your tramadol for more severe pain. Follow with your primary care doctor in 2-3 days for reevaluation of redness to the foot. Return to the ED sooner if you have fevers, chills, numbness to foot.

## 2018-01-03 NOTE — ED Provider Notes (Signed)
[facility] DEPT Provider Note   CSN: [id number] Arrival date & time: 01/03/18  1029     History   Chief Complaint Chief Complaint  Patient presents with  . Foot Pain  . Leg Pain    HPI Erica Escobar is a 82 y.o. female ith history of colon cancer after resection,hypertension, hyperlipidemia is here for evaluation of gradually worsening pain, redness, warmth to the left lower extremity worse at the top of her foot. Denies trauma. Aggravating factors include palpation, range of motion and weightbearing. Has not been able to walk today due to the pain. Has tried aspirin cream which has not helped. She denies previous history of DVT/PE.No history of gout. IV drug use. No trauma. No recent travel, immobilization, estrogen use.  She denies fevers, chills, chest pain, shortness of breath, pleuritic chest pain, cough, abdominal pain, vomiting, nausea, diarrhea, constipation. No numbness, tingling or weakness to lower extremities. No back pain or recent falls.  HPI  Past Medical History:  Diagnosis Date  . Abnormal tympanic membrane    right ear and mastoid problems  . Arthritis    oseoarthritis  . Cancer of ascending colon ([place]) 02/15/2015  . Constipation, chronic   . COPD (chronic obstructive pulmonary disease) (HCC)    mild COPD  . Depression   . Diverticulosis   . GERD (gastroesophageal reflux disease)   . Glaucoma    bilateral-sugery with laser, using eye drops  . Hemorrhoids    Internal  . History of hiatal hernia    dx. '87 Schatzki's ring  . Hyperlipidemia    good control  . Hypertension   . Insomnia    hx. of  . Keratosis, actinic   . Leg cramps   . Macular degeneration   . Neuromuscular disorder (HCC)    neuropathy legs,   . On aspirin at home    chronic use  . PONV (postoperative nausea and vomiting)   . Restless leg syndrome   . Schatzki's ring 1987  . Skin cancer    multiple, "tx. area right anterior wrist"  . Urinary  incontinence    occ, stress    Patient Active Problem List   Diagnosis Date Noted  . Cancer of ascending colon s/p robotic colectomy 02/15/2015 02/15/2015  . Restless leg syndrome   . Depression   . Insomnia   . COPD (chronic obstructive pulmonary disease) ([place])   . GERD (gastroesophageal reflux disease)     Past Surgical History:  Procedure Laterality Date  . ABDOMINAL HYSTERECTOMY    . BLADDER REPAIR    . CARDIAC CATHETERIZATION  2007, 2009  . CATARACT EXTRACTION Bilateral   . CHOLECYSTECTOMY    . COLECTOMY    . FINGER NAIL SURGERY    . FOOT SURGERY Right   . HEMORROIDECTOMY    . INNER EAR SURGERY Right    3-4 times  . INTRAOPERATIVE ARTERIOGRAM     right arm  . KNEE SURGERY Right   . LEG SURGERY Bilateral    laser ablation  . MASTOID DEBRIDEMENT     x2  . SKIN CANCER EXCISION     right/left thumb nail, left face, right ear/neck, right leg  . TOE FUSION    . TONSILLECTOMY      OB History    No data available       Home Medications    Prior to Admission medications   Medication Sig Start Date End Date Taking? Authorizing Provider  acetaminophen (TYLENOL) 325  MG tablet Take 650 mg by mouth every 6 (six) hours as needed for mild pain, moderate pain, fever or headache.    Yes [provider]  Artificial Tear Solution (SOOTHE HYDRATION) 1.25 % SOLN Place 6 drops into both eyes daily.   Yes [provider]  Ascorbic Acid (VITAMIN C) 1000 MG tablet Take 1,000 mg by mouth daily.   Yes [provider]  aspirin EC 81 MG tablet Take 81 mg by mouth every morning.   Yes [provider]  beta carotene w/minerals (OCUVITE) tablet Take 1 tablet by mouth daily.    Yes [provider]  calcium-vitamin D (OSCAL) 250-125 MG-UNIT per tablet Take 1 tablet by mouth every morning.   Yes [provider]  Cyanocobalamin (VITAMIN B 12 PO) Take 1 tablet by mouth every morning.   Yes [provider]  cycloSPORINE (RESTASIS)  0.05 % ophthalmic emulsion Place 1 drop into both eyes 2 (two) times daily.   Yes [provider]  furosemide (LASIX) 40 MG tablet Take 40 mg by mouth daily.    Yes [provider]  gemfibrozil (LOPID) 600 MG tablet Take 600 mg by mouth every morning.   Yes [provider]  loratadine (CLARITIN) 10 MG tablet Take 10 mg by mouth daily as needed for allergies.   Yes [provider]  Multiple Vitamin (MULTIVITAMIN WITH MINERALS) TABS tablet Take 1 tablet by mouth every morning.   Yes [provider]  Omega-3 Fatty Acids (FISH OIL PO) Take 1 capsule by mouth daily with breakfast.    Yes [provider]  Pancrelipase, Lip-Prot-Amyl, (CREON PO) Take 1 capsule by mouth daily. *gets samples from dr*   Yes [provider]  pantoprazole (PROTONIX) 40 MG tablet Take 40 mg by mouth daily. 03/09/17  Yes [provider]  Polyvinyl Alcohol-Povidone PF (REFRESH) 1.4-0.6 % SOLN Place 6 drops into both eyes daily.   Yes [provider]  traMADol (ULTRAM) 50 MG tablet Take 25 mg by mouth every 6 (six) hours. 12/21/17  Yes [provider]  doxycycline (VIBRAMYCIN) 100 MG capsule Take 1 capsule (100 mg total) by mouth 2 (two) times daily. 01/03/18   Kinnie Feil, PA-C    Family History No family history on file.  Social History Social History   Tobacco Use  . Smoking status: Former Research scientist (life sciences)  . Smokeless tobacco: Never Used  . Tobacco comment: short time x1 - during pregnancy  Substance Use Topics  . Alcohol use: No    Alcohol/week: 0.0 oz  . Drug use: No     Allergies   Azithromycin; Codeine; Gabapentin; Niaspan [niacin]; Oxybutynin; Paxil [paroxetine hcl]; Penicillins; Pentazocine; Tizanidine; and Vicodin [hydrocodone-acetaminophen]   Review of Systems Review of Systems  Musculoskeletal: Positive for myalgias.  Skin: Positive for color change.  All other systems reviewed and are negative.    Physical  Exam Updated Vital Signs BP (!) 154/74 (BP Location: Left Arm)   Pulse 73   Temp 98.3 F (36.8 C) (Oral)   Resp 18   Ht 5' 3"  (1.6 m)   Wt 75.3 kg (166 lb)   SpO2 98%   BMI 29.41 kg/m   Physical Exam  Constitutional: She is oriented to person, place, and time. She appears well-developed and well-nourished. No distress.  NAD. Non toxic. AxO to self, place, time, event.   HENT:  Head: Normocephalic and atraumatic.  Right Ear: External ear normal.  Left Ear: External ear normal.  Nose: Nose  normal.  Eyes: Conjunctivae and EOM are normal. No scleral icterus.  Neck: Normal range of motion. Neck supple.  Cardiovascular: Normal rate, regular rhythm and normal heart sounds.  No murmur heard. 2+ DP, PT and popliteal pulses bilaterally. Edema from left foot to knee compared to right.   Pulmonary/Chest: Effort normal and breath sounds normal. She has no wheezes.  Abdominal: Soft. There is no tenderness.  Musculoskeletal: Normal range of motion. She exhibits no deformity.  Exquisite pain to left dorsum foot along metatarsals, lateral foot and sole. Mild pain with PROM of ankle and toes.  No focal bony tenderness to heels, malleoli, knee.   Neurological: She is alert and oriented to person, place, and time.  Sensation to light touch intact in lower extremities. 5/5 strength with dorsiflexion and plantar flexion bilaterally.   Skin: Skin is warm and dry. Capillary refill takes less than 2 seconds. There is erythema.  3 x 5 cm area of mild erythema to the left lateral dorsum foot, tender.  Psychiatric: She has a normal mood and affect. Her behavior is normal. Judgment and thought content normal.  Nursing note and vitals reviewed.    ED Treatments / Results  Labs (all labs ordered are listed, but only abnormal results are displayed) Labs Reviewed  CBC WITH DIFFERENTIAL/PLATELET - Abnormal; Notable for the following components:      Result Value   WBC 11.5 (*)    RBC 3.27 (*)     Hemoglobin 10.6 (*)    HCT 30.9 (*)    Neutro Abs 9.8 (*)    All other components within normal limits  BASIC METABOLIC PANEL - Abnormal; Notable for the following components:   Potassium 3.2 (*)    Creatinine, Ser 1.16 (*)    GFR calc non Af Amer 44 (*)    GFR calc Af Amer 51 (*)    All other components within normal limits  I-STAT CG4 LACTIC ACID, ED  I-STAT CG4 LACTIC ACID, ED    EKG  EKG Interpretation None       Radiology Dg Foot Complete Left  Result Date: 01/03/2018 CLINICAL DATA:  Swelling and erythema began yesterday over the dorsum of the foot anteriorly. No known injury. EXAM: LEFT FOOT - COMPLETE 3+ VIEW COMPARISON:  None in PACs FINDINGS: The bones are subjectively mildly osteopenic. The phalanges and metatarsals exhibit no fractures. No lytic or blastic lesions are observed. The interphalangeal joints exhibit mild narrowing. The MTP joints appear normal. The TMT and intertarsal joints exhibit mild degenerative change. The tarsal bones are intact. There are plantar and Achilles region calcaneal spurs. The soft tissues exhibit no significant swelling radiographically. IMPRESSION: There is no acute bony abnormality of the left foot. There are mild degenerative changes of the intertarsal, tarsometatarsal, and interphalangeal joints. Electronically Signed   By: David  Martinique M.D.   On: 01/03/2018 12:26    Procedures Procedures (including critical care time)  Medications Ordered in ED Medications  oxyCODONE-acetaminophen (PERCOCET/ROXICET) 5-325 MG per tablet 1 tablet (1 tablet Oral Given 01/03/18 1230)     Initial Impression / Assessment and Plan / ED Course  I have reviewed the triage vital signs and the nursing notes.  Pertinent labs & imaging results that were available during my care of the patient were reviewed by me and considered in my medical decision making (see chart for details).    82 yo F with h/o cancer here for LLE edema, warmth and pain x 2 days. On  exam she  has asymmetric edema from left foot to knee, exquisite tenderness and small area of erythema to lateral foot. NVI. No TTP to MTP. No h/o gout, IVD, trauma, DM.  X-ray and LLE [place] w/o acute injuries. No leukocytosis. No fever, tachycardia in ED. Will discharge with tx for cellulitis. Pt shared with SP.   Final Clinical Impressions(s) / ED Diagnoses   Final diagnoses:  Pain and swelling of left lower extremity  Cellulitis of left lower extremity    ED Discharge Orders        Ordered    doxycycline (VIBRAMYCIN) 100 MG capsule  2 times daily     01/03/18 1416       Kinnie Feil, [place] 01/03/18 1419

## 2018-01-06 DIAGNOSIS — R110 Nausea: Secondary | ICD-10-CM | POA: Diagnosis not present

## 2018-01-06 DIAGNOSIS — L03116 Cellulitis of left lower limb: Secondary | ICD-10-CM | POA: Diagnosis not present

## 2018-01-10 DIAGNOSIS — L03115 Cellulitis of right lower limb: Secondary | ICD-10-CM | POA: Diagnosis not present

## 2018-01-10 DIAGNOSIS — L98491 Non-pressure chronic ulcer of skin of other sites limited to breakdown of skin: Secondary | ICD-10-CM | POA: Diagnosis not present

## 2018-01-13 DIAGNOSIS — M419 Scoliosis, unspecified: Secondary | ICD-10-CM | POA: Diagnosis not present

## 2018-01-17 DIAGNOSIS — L0390 Cellulitis, unspecified: Secondary | ICD-10-CM | POA: Diagnosis not present

## 2018-01-17 DIAGNOSIS — D72829 Elevated white blood cell count, unspecified: Secondary | ICD-10-CM | POA: Diagnosis not present

## 2018-01-17 DIAGNOSIS — L03116 Cellulitis of left lower limb: Secondary | ICD-10-CM | POA: Diagnosis not present

## 2018-01-17 DIAGNOSIS — N183 Chronic kidney disease, stage 3 (moderate): Secondary | ICD-10-CM | POA: Diagnosis not present

## 2018-01-17 DIAGNOSIS — Z86010 Personal history of colonic polyps: Secondary | ICD-10-CM | POA: Diagnosis not present

## 2018-01-17 DIAGNOSIS — M19072 Primary osteoarthritis, left ankle and foot: Secondary | ICD-10-CM | POA: Diagnosis not present

## 2018-01-17 DIAGNOSIS — K5900 Constipation, unspecified: Secondary | ICD-10-CM | POA: Diagnosis not present

## 2018-01-17 DIAGNOSIS — M79672 Pain in left foot: Secondary | ICD-10-CM | POA: Diagnosis not present

## 2018-01-17 DIAGNOSIS — K208 Other esophagitis: Secondary | ICD-10-CM | POA: Diagnosis not present

## 2018-01-18 DIAGNOSIS — E785 Hyperlipidemia, unspecified: Secondary | ICD-10-CM | POA: Diagnosis not present

## 2018-01-18 DIAGNOSIS — Z85038 Personal history of other malignant neoplasm of large intestine: Secondary | ICD-10-CM | POA: Diagnosis not present

## 2018-01-18 DIAGNOSIS — I10 Essential (primary) hypertension: Secondary | ICD-10-CM | POA: Diagnosis not present

## 2018-01-18 DIAGNOSIS — D649 Anemia, unspecified: Secondary | ICD-10-CM | POA: Diagnosis not present

## 2018-01-19 DIAGNOSIS — E876 Hypokalemia: Secondary | ICD-10-CM | POA: Diagnosis not present

## 2018-01-19 DIAGNOSIS — M79672 Pain in left foot: Secondary | ICD-10-CM | POA: Diagnosis not present

## 2018-01-19 DIAGNOSIS — D72829 Elevated white blood cell count, unspecified: Secondary | ICD-10-CM | POA: Diagnosis not present

## 2018-01-24 DIAGNOSIS — M5431 Sciatica, right side: Secondary | ICD-10-CM | POA: Diagnosis not present

## 2018-01-24 DIAGNOSIS — R1110 Vomiting, unspecified: Secondary | ICD-10-CM | POA: Diagnosis not present

## 2018-01-24 DIAGNOSIS — D509 Iron deficiency anemia, unspecified: Secondary | ICD-10-CM | POA: Diagnosis not present

## 2018-01-24 DIAGNOSIS — E876 Hypokalemia: Secondary | ICD-10-CM | POA: Diagnosis not present

## 2018-01-24 DIAGNOSIS — I10 Essential (primary) hypertension: Secondary | ICD-10-CM | POA: Diagnosis not present

## 2018-01-24 DIAGNOSIS — M109 Gout, unspecified: Secondary | ICD-10-CM | POA: Diagnosis not present

## 2018-01-24 DIAGNOSIS — K5909 Other constipation: Secondary | ICD-10-CM | POA: Diagnosis not present

## 2018-01-25 DIAGNOSIS — Z791 Long term (current) use of non-steroidal anti-inflammatories (NSAID): Secondary | ICD-10-CM | POA: Diagnosis not present

## 2018-01-25 DIAGNOSIS — M109 Gout, unspecified: Secondary | ICD-10-CM | POA: Diagnosis not present

## 2018-01-25 DIAGNOSIS — I129 Hypertensive chronic kidney disease with stage 1 through stage 4 chronic kidney disease, or unspecified chronic kidney disease: Secondary | ICD-10-CM | POA: Diagnosis not present

## 2018-01-25 DIAGNOSIS — K219 Gastro-esophageal reflux disease without esophagitis: Secondary | ICD-10-CM | POA: Diagnosis not present

## 2018-01-25 DIAGNOSIS — N184 Chronic kidney disease, stage 4 (severe): Secondary | ICD-10-CM | POA: Diagnosis not present

## 2018-01-26 DIAGNOSIS — T50905A Adverse effect of unspecified drugs, medicaments and biological substances, initial encounter: Secondary | ICD-10-CM | POA: Diagnosis not present

## 2018-01-26 DIAGNOSIS — L299 Pruritus, unspecified: Secondary | ICD-10-CM | POA: Diagnosis not present

## 2018-01-28 DIAGNOSIS — Z85828 Personal history of other malignant neoplasm of skin: Secondary | ICD-10-CM | POA: Diagnosis not present

## 2018-01-28 DIAGNOSIS — L570 Actinic keratosis: Secondary | ICD-10-CM | POA: Diagnosis not present

## 2018-01-28 DIAGNOSIS — L821 Other seborrheic keratosis: Secondary | ICD-10-CM | POA: Diagnosis not present

## 2018-02-01 DIAGNOSIS — E790 Hyperuricemia without signs of inflammatory arthritis and tophaceous disease: Secondary | ICD-10-CM | POA: Diagnosis not present

## 2018-02-01 DIAGNOSIS — M109 Gout, unspecified: Secondary | ICD-10-CM | POA: Diagnosis not present

## 2018-02-01 DIAGNOSIS — M79673 Pain in unspecified foot: Secondary | ICD-10-CM | POA: Diagnosis not present

## 2018-02-01 DIAGNOSIS — N189 Chronic kidney disease, unspecified: Secondary | ICD-10-CM | POA: Diagnosis not present

## 2018-02-01 DIAGNOSIS — Z79899 Other long term (current) drug therapy: Secondary | ICD-10-CM | POA: Diagnosis not present

## 2018-02-02 DIAGNOSIS — L299 Pruritus, unspecified: Secondary | ICD-10-CM | POA: Diagnosis not present

## 2018-02-02 DIAGNOSIS — T50905A Adverse effect of unspecified drugs, medicaments and biological substances, initial encounter: Secondary | ICD-10-CM | POA: Diagnosis not present

## 2018-02-15 DIAGNOSIS — L299 Pruritus, unspecified: Secondary | ICD-10-CM | POA: Diagnosis not present

## 2018-02-15 DIAGNOSIS — T50905A Adverse effect of unspecified drugs, medicaments and biological substances, initial encounter: Secondary | ICD-10-CM | POA: Diagnosis not present

## 2018-02-18 DIAGNOSIS — M48061 Spinal stenosis, lumbar region without neurogenic claudication: Secondary | ICD-10-CM | POA: Diagnosis not present

## 2018-02-18 DIAGNOSIS — M5136 Other intervertebral disc degeneration, lumbar region: Secondary | ICD-10-CM | POA: Diagnosis not present

## 2018-02-18 DIAGNOSIS — M5416 Radiculopathy, lumbar region: Secondary | ICD-10-CM | POA: Diagnosis not present

## 2018-02-18 DIAGNOSIS — M4726 Other spondylosis with radiculopathy, lumbar region: Secondary | ICD-10-CM | POA: Diagnosis not present

## 2018-03-03 DIAGNOSIS — Z79899 Other long term (current) drug therapy: Secondary | ICD-10-CM | POA: Diagnosis not present

## 2018-03-03 DIAGNOSIS — M109 Gout, unspecified: Secondary | ICD-10-CM | POA: Diagnosis not present

## 2018-03-03 DIAGNOSIS — M79673 Pain in unspecified foot: Secondary | ICD-10-CM | POA: Diagnosis not present

## 2018-03-03 DIAGNOSIS — N189 Chronic kidney disease, unspecified: Secondary | ICD-10-CM | POA: Diagnosis not present

## 2018-03-03 DIAGNOSIS — E790 Hyperuricemia without signs of inflammatory arthritis and tophaceous disease: Secondary | ICD-10-CM | POA: Diagnosis not present

## 2018-03-16 DIAGNOSIS — E790 Hyperuricemia without signs of inflammatory arthritis and tophaceous disease: Secondary | ICD-10-CM | POA: Diagnosis not present

## 2018-03-16 DIAGNOSIS — M79673 Pain in unspecified foot: Secondary | ICD-10-CM | POA: Diagnosis not present

## 2018-03-16 DIAGNOSIS — N189 Chronic kidney disease, unspecified: Secondary | ICD-10-CM | POA: Diagnosis not present

## 2018-03-16 DIAGNOSIS — Z79899 Other long term (current) drug therapy: Secondary | ICD-10-CM | POA: Diagnosis not present

## 2018-03-16 DIAGNOSIS — M109 Gout, unspecified: Secondary | ICD-10-CM | POA: Diagnosis not present

## 2018-03-23 DIAGNOSIS — M545 Low back pain: Secondary | ICD-10-CM | POA: Diagnosis not present

## 2018-03-29 ENCOUNTER — Inpatient Hospital Stay: Payer: Medicare Other | Attending: Nurse Practitioner | Admitting: Nurse Practitioner

## 2018-03-29 ENCOUNTER — Inpatient Hospital Stay: Payer: Medicare Other

## 2018-03-29 ENCOUNTER — Telehealth: Payer: Self-pay | Admitting: Oncology

## 2018-03-29 ENCOUNTER — Encounter: Payer: Self-pay | Admitting: Nurse Practitioner

## 2018-03-29 ENCOUNTER — Other Ambulatory Visit: Payer: Self-pay | Admitting: Nurse Practitioner

## 2018-03-29 VITALS — BP 133/84 | HR 88 | Temp 97.6°F | Resp 18 | Ht 63.0 in | Wt 157.9 lb

## 2018-03-29 DIAGNOSIS — H409 Unspecified glaucoma: Secondary | ICD-10-CM | POA: Insufficient documentation

## 2018-03-29 DIAGNOSIS — C182 Malignant neoplasm of ascending colon: Secondary | ICD-10-CM

## 2018-03-29 DIAGNOSIS — Z79899 Other long term (current) drug therapy: Secondary | ICD-10-CM | POA: Insufficient documentation

## 2018-03-29 DIAGNOSIS — Z85038 Personal history of other malignant neoplasm of large intestine: Secondary | ICD-10-CM

## 2018-03-29 DIAGNOSIS — Z85828 Personal history of other malignant neoplasm of skin: Secondary | ICD-10-CM | POA: Insufficient documentation

## 2018-03-29 DIAGNOSIS — D649 Anemia, unspecified: Secondary | ICD-10-CM | POA: Insufficient documentation

## 2018-03-29 LAB — CBC WITH DIFFERENTIAL (CANCER CENTER ONLY)
Basophils Absolute: 0 10*3/uL (ref 0.0–0.1)
Basophils Relative: 0 %
EOS ABS: 0.1 10*3/uL (ref 0.0–0.5)
EOS PCT: 1 %
HCT: 34.3 % — ABNORMAL LOW (ref 34.8–46.6)
Hemoglobin: 11.9 g/dL (ref 11.6–15.9)
LYMPHS ABS: 1 10*3/uL (ref 0.9–3.3)
Lymphocytes Relative: 9 %
MCH: 32.9 pg (ref 25.1–34.0)
MCHC: 34.6 g/dL (ref 31.5–36.0)
MCV: 95.3 fL (ref 79.5–101.0)
MONOS PCT: 8 %
Monocytes Absolute: 0.9 10*3/uL (ref 0.1–0.9)
Neutro Abs: 8.9 10*3/uL — ABNORMAL HIGH (ref 1.5–6.5)
Neutrophils Relative %: 82 %
PLATELETS: 430 10*3/uL — AB (ref 145–400)
RBC: 3.6 MIL/uL — ABNORMAL LOW (ref 3.70–5.45)
RDW: 15.5 % — ABNORMAL HIGH (ref 11.2–14.5)
WBC Count: 10.8 10*3/uL — ABNORMAL HIGH (ref 3.9–10.3)

## 2018-03-29 LAB — CMP (CANCER CENTER ONLY)
ALBUMIN: 3.1 g/dL — AB (ref 3.5–5.0)
ALK PHOS: 60 U/L (ref 40–150)
ALT: 10 U/L (ref 0–55)
AST: 10 U/L (ref 5–34)
Anion gap: 11 (ref 3–11)
BUN: 32 mg/dL — ABNORMAL HIGH (ref 7–26)
CALCIUM: 9.4 mg/dL (ref 8.4–10.4)
CO2: 29 mmol/L (ref 22–29)
CREATININE: 1.34 mg/dL — AB (ref 0.60–1.10)
Chloride: 104 mmol/L (ref 98–109)
GFR, EST AFRICAN AMERICAN: 42 mL/min — AB (ref >60)
GFR, Estimated: 37 mL/min — ABNORMAL LOW (ref >60)
GLUCOSE: 93 mg/dL (ref 70–140)
Potassium: 3.9 mmol/L (ref 3.5–5.1)
SODIUM: 144 mmol/L (ref 136–145)
Total Bilirubin: 0.5 mg/dL (ref 0.2–1.2)
Total Protein: 6.9 g/dL (ref 6.4–8.3)

## 2018-03-29 LAB — CEA (IN HOUSE-CHCC): CEA (CHCC-In House): 4.21 ng/mL (ref 0.00–5.00)

## 2018-03-29 NOTE — Telephone Encounter (Signed)
Appt scheduled AVS/Calendar printed per 4/16 los

## 2018-03-29 NOTE — Progress Notes (Addendum)
[facility] OFFICE PROGRESS NOTE   Diagnosis: Colon cancer  INTERVAL HISTORY:   Ms. Frees returns as scheduled.  No change in bowel habits.  She thinks she may occasionally see a small amount of blood in her stool.  Main complaint today is progressive back pain related to arthritis.  She is seeing Dr. Gioffre.  She had a recent "injection" with no improvement.  She reports being diagnosed with "gout" in February.  Around the same time she developed nausea/vomiting/diarrhea for 2 weeks and reports losing about 30 pounds.  She has regained some of that weight.  She overall has a good appetite.  Objective:  Vital signs in last 24 hours:  Blood pressure 133/84, pulse 88, temperature 97.6 F (36.4 C), temperature source Oral, resp. rate 18, height 5' 3" (1.6 m), weight 157 lb 14.4 oz (71.6 kg), SpO2 100 %.    HEENT: Neck without mass. Lymphatics: No palpable cervical, supraclavicular, axillary or inguinal lymph nodes. Resp: Lungs clear bilaterally. Cardio: Regular rate and rhythm. GI: Abdomen soft and nontender.  No hepatomegaly. Vascular: Trace lower leg edema bilaterally.   Lab Results:  Lab Results  Component Value Date   WBC 11.5 (H) 01/03/2018   HGB 10.6 (L) 01/03/2018   HCT 30.9 (L) 01/03/2018   MCV 94.5 01/03/2018   PLT 299 01/03/2018   NEUTROABS 9.8 (H) 01/03/2018    Imaging:  No results found.  Medications: I have reviewed the patient's current medications.  Assessment/Plan: 1. 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 Loss of MLH1 and PMS2, MSI-high, BRAF mutation detected ? Colonoscopy February 2017 ? Colonoscopy 12/16/2017-3 mm polyp in the transverse colon, 10 mm polyp in the sigmoid colon.  Diverticulosis in the sigmoid colon.  Transverse colon polyp, tubular adenoma; sigmoid colon polyp, tubular adenoma.  2. Anemia-likely secondary to bleeding from the colon  cancer and surgery. Hemoglobin in normal range 03/20/2016 3. Ascending colon "polyps" removed on the colonoscopy 12/22/2014 with the pathology revealing poorly differentiated adenocarcinoma and a sessile serrated adenoma polyp, similar to the pathology on the hepatic flexure mass 4. History of multiple skin cancers. Seen by dermatology every 3 months. 5. Glaucoma    Disposition: Ms. Rowen has a history of stage II colon cancer dating to March 2016.  She remains in clinical remission.  We will follow-up on the CEA from today.    At today's visit she has several complaints including worsening back pain over the past 6 months and weight loss.  We recommended she follow-up with Dr. Gioffre in the near future regarding the back pain.  She thinks she is regaining some of the weight she lost earlier this year.    Review of labs done in January of this year show recurrent anemia.  She will return to the lab today for a CBC and chemistry panel.    We decided to see her back at a 3-month interval for reevaluation of the above.  She will contact the office prior to that visit with further problems.  Patient seen with Dr. Sherrill.  25 minutes were spent face-to-face at today's visit with the majority of that time involved in counseling/coordination of care.      ANP/GNP-BC   03/29/2018  10:19 AM  This was a shared visit with  .  Ms. Mcneary was interviewed and examined.  The back pain is most likely related to a benign musculoskeletal condition.  She reports   weight loss to nausea when she had an episode of gout in February.  She reports recent weight gain.  We will follow-up on a CBC and CEA today.  Julieanne Manson, MD

## 2018-03-31 ENCOUNTER — Telehealth: Payer: Self-pay

## 2018-03-31 ENCOUNTER — Telehealth: Payer: Self-pay | Admitting: Oncology

## 2018-03-31 NOTE — Telephone Encounter (Addendum)
Pt voiced understanding of message below. Also states that she is in pain and requesting pain medication. Will alert Lattie Haw NP Per Lattie Haw NP, pt to follow up with Dr. Gladstone Lighter. Pt voiced understanding ----- Message from Owens Shark, NP sent at 03/29/2018  3:51 PM EDT ----- Please let her know the CEA from today was a little higher but is still in the normal range.  Dr. Benay Spice would like for her to return for a follow-up visit and repeat CEA in 1 month.  I will enter the lab order and send the schedule message.

## 2018-03-31 NOTE — Telephone Encounter (Signed)
Scheduled appt per 4/16 sch msg - spoke with patient regarding appts that were added.

## 2018-04-12 DIAGNOSIS — N390 Urinary tract infection, site not specified: Secondary | ICD-10-CM | POA: Diagnosis not present

## 2018-04-12 DIAGNOSIS — M109 Gout, unspecified: Secondary | ICD-10-CM | POA: Diagnosis not present

## 2018-04-12 DIAGNOSIS — E559 Vitamin D deficiency, unspecified: Secondary | ICD-10-CM | POA: Diagnosis not present

## 2018-04-12 DIAGNOSIS — Z7982 Long term (current) use of aspirin: Secondary | ICD-10-CM | POA: Diagnosis not present

## 2018-04-12 DIAGNOSIS — I10 Essential (primary) hypertension: Secondary | ICD-10-CM | POA: Diagnosis not present

## 2018-04-12 DIAGNOSIS — E785 Hyperlipidemia, unspecified: Secondary | ICD-10-CM | POA: Diagnosis not present

## 2018-04-14 DIAGNOSIS — M79673 Pain in unspecified foot: Secondary | ICD-10-CM | POA: Diagnosis not present

## 2018-04-14 DIAGNOSIS — Z79899 Other long term (current) drug therapy: Secondary | ICD-10-CM | POA: Diagnosis not present

## 2018-04-14 DIAGNOSIS — M109 Gout, unspecified: Secondary | ICD-10-CM | POA: Diagnosis not present

## 2018-04-14 DIAGNOSIS — E790 Hyperuricemia without signs of inflammatory arthritis and tophaceous disease: Secondary | ICD-10-CM | POA: Diagnosis not present

## 2018-04-14 DIAGNOSIS — N189 Chronic kidney disease, unspecified: Secondary | ICD-10-CM | POA: Diagnosis not present

## 2018-04-15 DIAGNOSIS — Z0000 Encounter for general adult medical examination without abnormal findings: Secondary | ICD-10-CM | POA: Diagnosis not present

## 2018-04-15 DIAGNOSIS — I519 Heart disease, unspecified: Secondary | ICD-10-CM | POA: Diagnosis not present

## 2018-04-15 DIAGNOSIS — J309 Allergic rhinitis, unspecified: Secondary | ICD-10-CM | POA: Diagnosis not present

## 2018-04-15 DIAGNOSIS — K449 Diaphragmatic hernia without obstruction or gangrene: Secondary | ICD-10-CM | POA: Diagnosis not present

## 2018-04-15 DIAGNOSIS — I10 Essential (primary) hypertension: Secondary | ICD-10-CM | POA: Diagnosis not present

## 2018-04-19 DIAGNOSIS — J449 Chronic obstructive pulmonary disease, unspecified: Secondary | ICD-10-CM | POA: Diagnosis not present

## 2018-04-19 DIAGNOSIS — M810 Age-related osteoporosis without current pathological fracture: Secondary | ICD-10-CM | POA: Diagnosis not present

## 2018-05-02 ENCOUNTER — Encounter: Payer: Self-pay | Admitting: Nurse Practitioner

## 2018-05-02 ENCOUNTER — Telehealth: Payer: Self-pay | Admitting: Oncology

## 2018-05-02 ENCOUNTER — Inpatient Hospital Stay: Payer: Medicare Other | Admitting: Nurse Practitioner

## 2018-05-02 ENCOUNTER — Inpatient Hospital Stay: Payer: Medicare Other | Attending: Nurse Practitioner

## 2018-05-02 ENCOUNTER — Inpatient Hospital Stay: Payer: Medicare Other

## 2018-05-02 VITALS — BP 144/95 | HR 87 | Temp 98.6°F | Resp 21 | Ht 63.0 in | Wt 164.8 lb

## 2018-05-02 DIAGNOSIS — D649 Anemia, unspecified: Secondary | ICD-10-CM

## 2018-05-02 DIAGNOSIS — Z85828 Personal history of other malignant neoplasm of skin: Secondary | ICD-10-CM | POA: Insufficient documentation

## 2018-05-02 DIAGNOSIS — C182 Malignant neoplasm of ascending colon: Secondary | ICD-10-CM

## 2018-05-02 DIAGNOSIS — Z85038 Personal history of other malignant neoplasm of large intestine: Secondary | ICD-10-CM

## 2018-05-02 LAB — CMP (CANCER CENTER ONLY)
ALT: 11 U/L (ref 0–55)
ANION GAP: 8 (ref 3–11)
AST: 14 U/L (ref 5–34)
Albumin: 3.5 g/dL (ref 3.5–5.0)
Alkaline Phosphatase: 56 U/L (ref 40–150)
BILIRUBIN TOTAL: 0.7 mg/dL (ref 0.2–1.2)
BUN: 27 mg/dL — ABNORMAL HIGH (ref 7–26)
CALCIUM: 9.1 mg/dL (ref 8.4–10.4)
CO2: 28 mmol/L (ref 22–29)
Chloride: 107 mmol/L (ref 98–109)
Creatinine: 1.53 mg/dL — ABNORMAL HIGH (ref 0.60–1.10)
GFR, EST AFRICAN AMERICAN: 36 mL/min — AB (ref >60)
GFR, Estimated: 31 mL/min — ABNORMAL LOW (ref >60)
Glucose, Bld: 81 mg/dL (ref 70–140)
Potassium: 4.1 mmol/L (ref 3.5–5.1)
Sodium: 143 mmol/L (ref 136–145)
TOTAL PROTEIN: 6.5 g/dL (ref 6.4–8.3)

## 2018-05-02 LAB — CEA (IN HOUSE-CHCC): CEA (CHCC-In House): 2.78 ng/mL (ref 0.00–5.00)

## 2018-05-02 LAB — CBC WITH DIFFERENTIAL (CANCER CENTER ONLY)
BASOS ABS: 0 10*3/uL (ref 0.0–0.1)
BASOS PCT: 0 %
Eosinophils Absolute: 0.1 10*3/uL (ref 0.0–0.5)
Eosinophils Relative: 1 %
HEMATOCRIT: 30.2 % — AB (ref 34.8–46.6)
HEMOGLOBIN: 10.3 g/dL — AB (ref 11.6–15.9)
Lymphocytes Relative: 16 %
Lymphs Abs: 1 10*3/uL (ref 0.9–3.3)
MCH: 33.7 pg (ref 25.1–34.0)
MCHC: 34.2 g/dL (ref 31.5–36.0)
MCV: 98.5 fL (ref 79.5–101.0)
Monocytes Absolute: 0.5 10*3/uL (ref 0.1–0.9)
Monocytes Relative: 9 %
NEUTROS ABS: 4.6 10*3/uL (ref 1.5–6.5)
NEUTROS PCT: 74 %
Platelet Count: 291 10*3/uL (ref 145–400)
RBC: 3.06 MIL/uL — ABNORMAL LOW (ref 3.70–5.45)
RDW: 15.1 % — AB (ref 11.2–14.5)
WBC Count: 6.2 10*3/uL (ref 3.9–10.3)

## 2018-05-02 NOTE — Progress Notes (Signed)
  [place] OFFICE PROGRESS NOTE   Diagnosis: Colon cancer  INTERVAL HISTORY:   Erica Escobar returns as scheduled.  She reports her back pain is much better since beginning meloxicam.  She also applies Aspercreme.  Bowels moving regularly.  Overall she has a good appetite.  She has gained some weight since her last visit.  Objective:  Vital signs in last 24 hours:  Blood pressure (!) 144/95, pulse 87, temperature 98.6 F (37 C), temperature source Oral, resp. rate (!) 21, height 5' 3"  (1.6 m), weight 164 lb 12.8 oz (74.8 kg), SpO2 99 %.    HEENT: No thrush or ulcers. Resp: Lungs clear bilaterally. Cardio: Regular rate and rhythm. GI: Abdomen is soft.  No hepatomegaly. Vascular: Trace edema at the lower legs bilaterally.   Lab Results:  Lab Results  Component Value Date   WBC 10.8 (H) 03/29/2018   HGB 11.9 03/29/2018   HCT 34.3 (L) 03/29/2018   MCV 95.3 03/29/2018   PLT 430 (H) 03/29/2018   NEUTROABS 8.9 (H) 03/29/2018   05/02/2018 CEA- 2.78 Imaging:  No results found.  Medications: I have reviewed the patient's current medications.  Assessment/Plan: 1. 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 Loss of MLH1 and PMS2, MSI-high, BRAF mutation detected ? Colonoscopy February 2017 ? Colonoscopy 12/16/2017-3 mm polyp in the transverse colon, 10 mm polyp in the sigmoid colon.  Diverticulosis in the sigmoid colon.  Transverse colon polyp, tubular adenoma; sigmoid colon polyp, tubular adenoma.  2. Anemia-likely secondary to bleeding from the colon cancer and surgery. Hemoglobin in normal range 03/20/2016 3. Ascending colon "polyps" removed on the colonoscopy 12/22/2014 with the pathology revealing poorly differentiated adenocarcinoma and a sessile serrated adenoma polyp, similar to the pathology on the hepatic flexure mass 4. History of multiple skin cancers. Seen by dermatology  every 3 months. 5. Glaucoma    Disposition: Erica Escobar appears stable.  She has a history of stage II colon cancer dating to March 2016.  She remains in clinical remission.  The CEA was higher in the normal range when she was here 03/29/2017.  Repeat CEA from today is improved.  She was having significant back pain when she was here last month.  She notes improvement since beginning meloxicam.  She will continue to follow-up with Dr. Gladstone Lighter.  She will return to the lab today for a follow-up CBC and chemistry panel.  She will return for lab and follow-up in 2 months.  She will contact the office in the interim with any problems.  Plan reviewed with Dr. Benay Spice.    Ned Card ANP/GNP-BC   05/02/2018  3:00 PM

## 2018-05-02 NOTE — Telephone Encounter (Signed)
Appointments scheduled AVS/Calendar printed per 5/20 los

## 2018-05-03 ENCOUNTER — Telehealth: Payer: Self-pay | Admitting: Emergency Medicine

## 2018-05-03 ENCOUNTER — Encounter: Payer: Self-pay | Admitting: Internal Medicine

## 2018-05-03 ENCOUNTER — Ambulatory Visit: Payer: Medicare Other | Admitting: Internal Medicine

## 2018-05-03 VITALS — BP 128/72 | HR 98 | Ht 63.0 in | Wt 166.4 lb

## 2018-05-03 DIAGNOSIS — R942 Abnormal results of pulmonary function studies: Secondary | ICD-10-CM | POA: Diagnosis not present

## 2018-05-03 DIAGNOSIS — R0689 Other abnormalities of breathing: Secondary | ICD-10-CM | POA: Diagnosis not present

## 2018-05-03 DIAGNOSIS — J42 Unspecified chronic bronchitis: Secondary | ICD-10-CM

## 2018-05-03 DIAGNOSIS — R0600 Dyspnea, unspecified: Secondary | ICD-10-CM | POA: Diagnosis not present

## 2018-05-03 NOTE — Progress Notes (Signed)
Subjective:     Patient ID: Erica Escobar, female   DOB: March 13, 1936, 82 y.o.   MRN: [id number]   Erica Dopp, MD  HPI   IOV 05/03/18 D Chief Complaint  Patient presents with  . Consult    Referred by Dr. Shelia Escobar for COPD.  CAT score: 24.   History is gained from talking to the patient, review of the old chart.  Patient is a fairly accurate historian.  She tells me that she is been dyspneic for many years.  Unclear if it is progressive or stable.  Moderate in intensity.  She needs a walker because of DJD and gout issues and back pain.  She has no problems going to the mailbox which is downhill but while coming back uphill she has to stop to rest and this is because of shortness of breath associated with knee and back pain.  She does have atypical chest pains and she is on nitroglycerin as needed tablets but she tells me a few years ago Dr. Wynonia Escobar did nuclear medicine cardiac stress test and this was normal.  She is listed as a smoker but then she tells me that she only smoked for 9 months when she was pregnant with her first child because she was craving as part of her pregnancy cigarettes but then she quit.  There is no associated cough or wheezing  Relevant tests reviewed include echocardiogram October 2017 that shows grade 1 diastolic dysfunction but normal systolic function She has a history of adenocarcinoma of the colon She had CT scan of the chest January 2016 that I personally visualized and the lung fields look clear Blood labs April 2019 shows slightly elevated creatinine 1.34 mg percent with a reduced GFR of 37 and a hemoglobin of 11.9 g% She had pulmonary function test Apr 15, 2018 that basically shows normal spirometry and normal total lung capacity but isolated reduction in diffusion capacity to 46%.  But she did not desaturate here but stopped it to labs because of shortness of breath.      CAT COPD Symptom & Quality of Life Score (GSK trademark) 0 is no burden. 5  is highest burden 05/03/2018   Never Cough -> Cough all the time   No phlegm in chest -> Chest is full of phlegm   No chest tightness -> Chest feels very tight   No dyspnea for 1 flight stairs/hill -> Very dyspneic for 1 flight of stairs   No limitations for ADL at home -> Very limited with ADL at home   Confident leaving home -> Not at all confident leaving home   Sleep soundly -> Do not sleep soundly because of lung condition   Lots of Energy -> No energy at all   TOTAL Score (max 40)  24    Simple office walk 185 feet x  3 laps goal with forehead probe 05/03/2018   O2 used Room air  Number laps completed 2   Comments about pace Very slow pace, needed walker  Resting Pulse Ox/HR 98% and 98/min  Final Pulse Ox/HR 97% and 107/min  Desaturated </= 88% no  Desaturated <= 3% points no  Got Tachycardic >/= 90/min yes  Symptoms at end of test Fatigute, back pain, dyspnea  Miscellaneous comments Only 2 laps   Results for Erica Escobar (MRN [id number]) as of 05/03/2018 09:47  Ref. Range 04/15/2017 13:52  FEV1-Post Latest Units: L 1.43  FEV1-%Pred-Post Latest Units: % 79  FEV1-%Change-Post Latest Units: % -9  Post FEV1/FVC ratio Latest Units: % 81  FEV1FVC-%Change-Post Latest Units: % 3  Results for Erica Escobar (MRN [id number]) as of 05/03/2018 09:47  Ref. Range 04/15/2017 13:52  DLCO unc Latest Units: ml/min/mmHg 9.74  DLCO unc % pred Latest Units: % 45     has a past medical history of Abnormal tympanic membrane, Arthritis, Cancer of ascending colon ([place]) (02/15/2015), Constipation, chronic, COPD (chronic obstructive pulmonary disease) (HCC), Depression, Diverticulosis, GERD (gastroesophageal reflux disease), Glaucoma, Hemorrhoids, History of hiatal hernia, Hyperlipidemia, Hypertension, Insomnia, Keratosis, actinic, Leg cramps, Macular degeneration, Neuromuscular disorder (HCC), On aspirin at home, PONV (postoperative nausea and vomiting), Restless leg syndrome, Schatzki's ring (1987),  Skin cancer, and Urinary incontinence.   reports that she has quit smoking. She has never used smokeless tobacco.  Past Surgical History:  Procedure Laterality Date  . ABDOMINAL HYSTERECTOMY    . BLADDER REPAIR    . CARDIAC CATHETERIZATION  2007, 2009  . CATARACT EXTRACTION Bilateral   . CHOLECYSTECTOMY    . COLECTOMY    . FINGER NAIL SURGERY    . FOOT SURGERY Right   . HEMORROIDECTOMY    . INNER EAR SURGERY Right    3-4 times  . INTRAOPERATIVE ARTERIOGRAM     right arm  . KNEE SURGERY Right   . LEG SURGERY Bilateral    laser ablation  . MASTOID DEBRIDEMENT     x2  . SKIN CANCER EXCISION     right/left thumb nail, left face, right ear/neck, right leg  . TOE FUSION    . TONSILLECTOMY      Allergies  Allergen Reactions  . Azithromycin Itching  . Cephalexin   . Codeine Itching  . Gabapentin Swelling    Throat and leg swelling  . Niaspan [Niacin]   . Oxybutynin Other (See Comments)    "made me go crazy"-- had crazy dreams.   . Paxil [Paroxetine Hcl]     Unknown reaction  . Penicillins Itching    Has patient had a PCN reaction causing immediate rash, facial/tongue/throat swelling, SOB or lightheadedness with hypotension: No Has patient had a PCN reaction causing severe rash involving mucus membranes or skin necrosis: Yes Has patient had a PCN reaction that required hospitalization: No Has patient had a PCN reaction occurring within the last 10 years: No If all of the above answers are "NO", then may proceed with Cephalosporin use.   Marland Kitchen Pentazocine Itching and Nausea Only    Headache.   . Tizanidine Other (See Comments)    "made me crazy"  . Vicodin [Hydrocodone-Acetaminophen] Other (See Comments)    "made me go crazy"    Immunization History  Administered Date(s) Administered  . Influenza, High Dose Seasonal PF 12/03/2017    No family history on file.   Current Outpatient Medications:  .  acetaminophen (TYLENOL) 325 MG tablet, Take 650 mg by mouth every 6  (six) hours as needed for mild pain, moderate pain, fever or headache. , Disp: , Rfl:  .  Artificial Tear Solution (SOOTHE HYDRATION) 1.25 % SOLN, Place 6 drops into both eyes daily., Disp: , Rfl:  .  Ascorbic Acid (VITAMIN C) 1000 MG tablet, Take 1,000 mg by mouth daily., Disp: , Rfl:  .  aspirin EC 81 MG tablet, Take 81 mg by mouth every morning., Disp: , Rfl:  .  beta carotene w/minerals (OCUVITE) tablet, Take 1 tablet by mouth daily. , Disp: , Rfl:  .  betamethasone valerate ointment (VALISONE) 0.1 %, , Disp: , Rfl:  .  calcium-vitamin D (OSCAL) 250-125 MG-UNIT per tablet, Take 1 tablet by mouth every morning., Disp: , Rfl:  .  clotrimazole-betamethasone (LOTRISONE) cream, clotrimazole-betamethasone 1 %-0.05 % topical cream, Disp: , Rfl:  .  Cyanocobalamin (VITAMIN B 12 PO), Take 1 tablet by mouth every morning., Disp: , Rfl:  .  furosemide (LASIX) 40 MG tablet, Take 40 mg by mouth daily. , Disp: , Rfl:  .  gemfibrozil (LOPID) 600 MG tablet, Take 600 mg by mouth every morning., Disp: , Rfl:  .  loratadine (CLARITIN) 10 MG tablet, Take 10 mg by mouth daily as needed for allergies., Disp: , Rfl:  .  Multiple Vitamin (MULTIVITAMIN WITH MINERALS) TABS tablet, Take 1 tablet by mouth every morning., Disp: , Rfl:  .  Omega-3 Fatty Acids (FISH OIL PO), Take 1 capsule by mouth daily with breakfast. , Disp: , Rfl:  .  pantoprazole (PROTONIX) 40 MG tablet, Take 40 mg by mouth daily., Disp: , Rfl:  .  Polyvinyl Alcohol-Povidone PF (REFRESH) 1.4-0.6 % SOLN, Place 6 drops into both eyes daily., Disp: , Rfl:  .  predniSONE (DELTASONE) 10 MG tablet, , Disp: , Rfl:  .  promethazine (PHENERGAN) 25 MG tablet, , Disp: , Rfl:  .  ULORIC 40 MG tablet, , Disp: , Rfl:    Review of Systems     Objective:   Physical Exam Vitals:   05/03/18 0920  BP: 128/72  Pulse: 98  SpO2: 100%  Weight: 166 lb 6.4 oz (75.5 kg)  Height: 5' 3"  (1.6 m)    Estimated body mass index is 29.48 kg/m as calculated from the  following:   Height as of this encounter: 5' 3"  (1.6 m).   Weight as of this encounter: 166 lb 6.4 oz (75.5 kg).   General Appearance:     OBESE - +  Head:    Normocephalic, without obvious abnormality, atraumatic  Eyes:    PERRL - yes, conjunctiva/corneas - clear      Ears:    Normal external ear canals, both ears  Nose:   NG tube - no  Throat:  ETT TUBE - no , OG tube - no  Neck:   Supple,  No enlargement/tenderness/nodules     Lungs:     Clear to auscultation bilaterally,   Chest wall:    No deformity  Heart:    S1 and S2 normal, no murmur, CVP - no.  Pressors - no  Abdomen:     Soft, no masses, no organomegaly  Genitalia:    Not done  Rectal:   not done  Extremities:   Extremities- intact. HAS WALKER.      Skin:   Intact in exposed areas .      Neurologic:   Sedation - none -> RASS - na . Moves all 4s - yes. CAM-ICU - neg . Orientation - x3+        Assessment:       ICD-10-CM   1. Dyspnea and respiratory abnormalities R06.00    R06.89   2. Chronic bronchitis, unspecified chronic bronchitis type (HCC) J42 CT CHEST HIGH RESOLUTION    Pulse oximetry, overnight  3. Abnormal PFT R94.2    My concern is that she has obesity and diastolic dysfunction related to shortness of breath.  Also physical deconditioning.  The isolated reduction diffusion capacity in the absence of significant smoking behooves that we rule out interstitial lung disease and test for significant nocturnal desaturations.    Plan:  We will regroup after high-resolution CT chest and overnight oxygen study   Dr. Brand Males, M.D., [facility].C.P Pulmonary and Critical Care Medicine Staff Physician, [place] Director - Interstitial Lung Disease  Program  [facility] [place] at [place], [place], [address]  Pager: [phone], If no answer or between  15:00h - 7:00h: call [phone] Telephone: [phone]

## 2018-05-03 NOTE — Progress Notes (Signed)
   Subjective:    Patient ID: [name], female    DOB: May 25, 1936, 82 y.o.   MRN: [id number]  HPI    Review of Systems  Constitutional: Negative for fever and unexpected weight change.  HENT: Positive for postnasal drip and sinus pressure. Negative for congestion, dental problem, ear pain, nosebleeds, rhinorrhea, sneezing, sore throat and trouble swallowing.   Eyes: Negative for redness and itching.  Respiratory: Positive for shortness of breath. Negative for cough, chest tightness and wheezing.   Cardiovascular: Negative for palpitations and leg swelling.  Gastrointestinal: Negative for nausea and vomiting.  Genitourinary: Negative for dysuria.  Musculoskeletal: Negative for joint swelling.  Skin: Negative for rash.  Neurological: Negative for headaches.  Hematological: Does not bruise/bleed easily.  Psychiatric/Behavioral: Negative for dysphoric mood. The patient is not nervous/anxious.        Objective:   Physical Exam        Assessment & Plan:

## 2018-05-03 NOTE — Patient Instructions (Signed)
Shortness of breath  Plan  - do HRCT supine and prone  - do ONO test on room air  Followup - next few weeks but after above; ok to see app to discuss results

## 2018-05-03 NOTE — Telephone Encounter (Addendum)
Labs faxed to Dr.Dunhams office at [phone]  ----- Message from Owens Shark, NP sent at 05/03/2018 12:45 PM EDT ----- Please send copy of the labs from 05/02/2018 to Dr. Jamal Maes

## 2018-05-06 ENCOUNTER — Telehealth: Payer: Self-pay | Admitting: Internal Medicine

## 2018-05-06 DIAGNOSIS — Z85828 Personal history of other malignant neoplasm of skin: Secondary | ICD-10-CM | POA: Diagnosis not present

## 2018-05-06 DIAGNOSIS — C44622 Squamous cell carcinoma of skin of right upper limb, including shoulder: Secondary | ICD-10-CM | POA: Diagnosis not present

## 2018-05-06 DIAGNOSIS — L570 Actinic keratosis: Secondary | ICD-10-CM | POA: Diagnosis not present

## 2018-05-06 DIAGNOSIS — L814 Other melanin hyperpigmentation: Secondary | ICD-10-CM | POA: Diagnosis not present

## 2018-05-06 DIAGNOSIS — D485 Neoplasm of uncertain behavior of skin: Secondary | ICD-10-CM | POA: Diagnosis not present

## 2018-05-06 DIAGNOSIS — L821 Other seborrheic keratosis: Secondary | ICD-10-CM | POA: Diagnosis not present

## 2018-05-06 NOTE — Telephone Encounter (Signed)
Called and spoke to patient. Patient stated that [facility] was supposed to bring out the equipment for ONO on room air twice now and haven't shown up either time. She has called multiple times and has become frustrated. I attempted to call [facility] but it is now after hours.  PCCs please help follow up on this. Thank you!

## 2018-05-10 ENCOUNTER — Encounter: Payer: Self-pay | Admitting: Internal Medicine

## 2018-05-10 DIAGNOSIS — J449 Chronic obstructive pulmonary disease, unspecified: Secondary | ICD-10-CM | POA: Diagnosis not present

## 2018-05-10 DIAGNOSIS — R0902 Hypoxemia: Secondary | ICD-10-CM | POA: Diagnosis not present

## 2018-05-10 NOTE — Telephone Encounter (Signed)
Erica Escobar called me back & left vm.  Erica Escobar called all of his patients back on Friday but neglected to call Erica Escobar.  He called her this morning & told her he would be bringing machine to her today.  Erica Escobar stated he would be picking it up tomorrow himself & would download so MR will have results asap.  Nothing further needed at this time.

## 2018-05-10 NOTE — Telephone Encounter (Signed)
Called [employer] & spoke to [place].  Order was received on 5/21 & per note on the acct Tomasita Crumble the tech called pt on 5/23 to deliver ono. Pt stated she had doctor's appt at 11:00 & wouldn't be home til 2:00.  Jeneen Rinks is going to call Tomasita Crumble this morning & find out what happened.  If machine was not delivered Tomasita Crumble would still have machine with him.  There is no record of pt calling them multiple times and Jeneen Rinks also checked log of voicemails & pt did not left them one.  Would appear maybe pt had tried to call them & just couldn't get thru.  He will call me back after he talks to [place].

## 2018-05-13 ENCOUNTER — Ambulatory Visit (INDEPENDENT_AMBULATORY_CARE_PROVIDER_SITE_OTHER)
Admission: RE | Admit: 2018-05-13 | Discharge: 2018-05-13 | Disposition: A | Discharge status: Home / Self Care | Payer: Medicare Other | Source: Ambulatory Visit | Attending: Internal Medicine | Admitting: Internal Medicine

## 2018-05-13 DIAGNOSIS — R0600 Dyspnea, unspecified: Secondary | ICD-10-CM

## 2018-05-13 DIAGNOSIS — J40 Bronchitis, not specified as acute or chronic: Secondary | ICD-10-CM | POA: Diagnosis not present

## 2018-05-13 DIAGNOSIS — J42 Unspecified chronic bronchitis: Secondary | ICD-10-CM

## 2018-05-24 ENCOUNTER — Encounter: Payer: Self-pay | Admitting: Internal Medicine

## 2018-05-24 ENCOUNTER — Ambulatory Visit: Payer: Medicare Other | Admitting: Internal Medicine

## 2018-05-24 VITALS — BP 120/68 | HR 87 | Ht 63.0 in | Wt 160.6 lb

## 2018-05-24 DIAGNOSIS — R0600 Dyspnea, unspecified: Secondary | ICD-10-CM | POA: Diagnosis not present

## 2018-05-24 DIAGNOSIS — R0689 Other abnormalities of breathing: Secondary | ICD-10-CM | POA: Diagnosis not present

## 2018-05-24 NOTE — Patient Instructions (Signed)
ICD-10-CM   1. Dyspnea and respiratory abnormalities R06.00    R06.89    Shortness of breath is due to physical deconditioning, weight and back pain No evidence of lung disease No evidence of night o2 drop  Plan followup as needed Please address back issues with PCP Deland Pretty, MD or Dr Kathlene November

## 2018-05-24 NOTE — Progress Notes (Signed)
Subjective:     Patient ID: Erica Escobar, female   DOB: Nov 10, 1936, 82 y.o.   MRN: [id number]  HPI  PCPP Erica Pretty, MD  HPI   IOV 05/03/18 D Chief Complaint  Patient presents with  . Consult    Referred by Dr. Shelia Media for COPD.  CAT score: 24.   History is gained from talking to the patient, review of the old chart.  Patient is a fairly accurate historian.  She tells me that she is been dyspneic for many years.  Unclear if it is progressive or stable.  Moderate in intensity.  She needs a walker because of DJD and gout issues and back pain.  She has no problems going to the mailbox which is downhill but while coming back uphill she has to stop to rest and this is because of shortness of breath associated with knee and back pain.  She does have atypical chest pains and she is on nitroglycerin as needed tablets but she tells me a few years ago Dr. Wynonia Lawman did nuclear medicine cardiac stress test and this was normal.  She is listed as a smoker but then she tells me that she only smoked for 9 months when she was pregnant with her first child because she was craving as part of her pregnancy cigarettes but then she quit.  There is no associated cough or wheezing  Relevant tests reviewed include echocardiogram October 2017 that shows grade 1 diastolic dysfunction but normal systolic function  She has a history of adenocarcinoma of the colon  She had CT scan of the chest January 2016 that I personally visualized and the lung fields look clear  Blood labs April 2019 shows slightly elevated creatinine 1.34 mg percent with a reduced GFR of 37 and a hemoglobin of 11.9 g%  She had pulmonary function test Apr 15, 2018 that basically shows normal spirometry and normal total lung capacity but isolated reduction in diffusion capacity to 46%.  But she did not desaturate here but stopped it to labs because of shortness of breath.        Simple office walk 185 feet x  3 laps goal with forehead  probe 05/03/2018   O2 used Room air  Number laps completed 2   Comments about pace Very slow pace, needed walker  Resting Pulse Ox/HR 98% and 98/min  Final Pulse Ox/HR 97% and 107/min  Desaturated </= 88% no  Desaturated <= 3% points no  Got Tachycardic >/= 90/min yes  Symptoms at end of test Fatigute, back pain, dyspnea  Miscellaneous comments Only 2 laps   Results for Erica, Escobar (MRN [id number]) as of 05/03/2018 09:47  Ref. Range 04/15/2017 13:52  FEV1-Post Latest Units: L 1.43  FEV1-%Pred-Post Latest Units: % 79  FEV1-%Change-Post Latest Units: % -9  Post FEV1/FVC ratio Latest Units: % 81  FEV1FVC-%Change-Post Latest Units: % 3  Results for Erica, Escobar (MRN [id number]) as of 05/03/2018 09:47  Ref. Range 04/15/2017 13:52  DLCO unc Latest Units: ml/min/mmHg 9.74  DLCO unc % pred Latest Units: % 45    OV 05/24/2018  Chief Complaint  Patient presents with  . Follow-up    HRCT performed 5/31.  Pt is having a lot of trouble with back pain today but other than that states she has been doing well.    Erica Escobar , 82 y.o. , with dob 09/07/1936 and female ,Not Hispanic or Latino from [address] [place] [address] - presents to lung  clinic for fu of dyspnea.  She had overnight pulse oximetry study.  This visit is purely to follow results of overnight pulse oximetry and high-resolution CT chest.  Overnight pulse oximetry done on May 10, 2018 shows the overall average pulse ox to be 94%.  Her lowest pulse ox was 75%.  The total time spent less than or equal to 88% was 2 minutes and 39 seconds.  She does not qualify for oxygen need at night.  She did have a high-resolution CT chest that I personally visualized and I do agree with the radiologist that this is essentially normal lung fields.  She tells me that her dyspnea persist unchanged.  The big issue is that she is having acute flareup of her back pain for the last few to several days.  She is going to see the rheumatologist  Escobar soon.  Tylenol is not helping it.  She does not have any bladder or bowel incontinence or any weakness in her legs at this point.  She continues to use a walker.       has a past medical history of Abnormal tympanic membrane, Arthritis, Cancer of ascending colon ([place]) (02/15/2015), Constipation, chronic, COPD (chronic obstructive pulmonary disease) ([place]), Depression, Diverticulosis, GERD (gastroesophageal reflux disease), Glaucoma, Hemorrhoids, History of hiatal hernia, Hyperlipidemia, Hypertension, Insomnia, Keratosis, actinic, Leg cramps, Macular degeneration, Neuromuscular disorder ([place]), On aspirin at home, PONV (postoperative nausea and vomiting), Restless leg syndrome, Schatzki's ring (1987), Skin cancer, and Urinary incontinence.   reports that she has quit smoking. She has never used smokeless tobacco.  Past Surgical History:  Procedure Laterality Date  . ABDOMINAL HYSTERECTOMY    . BLADDER REPAIR    . CARDIAC CATHETERIZATION  2007, 2009  . CATARACT EXTRACTION Bilateral   . CHOLECYSTECTOMY    . COLECTOMY    . FINGER NAIL SURGERY    . FOOT SURGERY Right   . HEMORROIDECTOMY    . INNER EAR SURGERY Right    3-4 times  . INTRAOPERATIVE ARTERIOGRAM     right arm  . KNEE SURGERY Right   . LEG SURGERY Bilateral    laser ablation  . MASTOID DEBRIDEMENT     x2  . SKIN CANCER EXCISION     right/left thumb nail, left face, right ear/neck, right leg  . TOE FUSION    . TONSILLECTOMY      Allergies  Allergen Reactions  . Azithromycin Itching  . Cephalexin   . Codeine Itching  . Gabapentin Swelling    Throat and leg swelling  . Niaspan [Niacin]   . Oxybutynin Other (See Comments)    "made me go crazy"-- had crazy dreams.   . Paxil [Paroxetine Hcl]     Unknown reaction  . Penicillins Itching    Has patient had a PCN reaction causing immediate rash, facial/tongue/throat swelling, SOB or lightheadedness with hypotension: No Has patient had a PCN reaction causing severe  rash involving mucus membranes or skin necrosis: Yes Has patient had a PCN reaction that required hospitalization: No Has patient had a PCN reaction occurring within the last 10 years: No If all of the above answers are "NO", then may proceed with Cephalosporin use.   Marland Kitchen Pentazocine Itching and Nausea Only    Headache.   . Tizanidine Other (See Comments)    "made me crazy"  . Vicodin [Hydrocodone-Acetaminophen] Other (See Comments)    "made me go crazy"    Immunization History  Administered Date(s) Administered  . Influenza, High Dose Seasonal PF  12/03/2017    No family history on file.   Current Outpatient Medications:  .  acetaminophen (TYLENOL) 325 MG tablet, Take 650 mg by mouth every 6 (six) hours as needed for mild pain, moderate pain, fever or headache. , Disp: , Rfl:  .  allopurinol (ZYLOPRIM) 100 MG tablet, , Disp: , Rfl:  .  Artificial Tear Solution (SOOTHE HYDRATION) 1.25 % SOLN, Place 6 drops into both eyes daily., Disp: , Rfl:  .  Ascorbic Acid (VITAMIN C) 1000 MG tablet, Take 1,000 mg by mouth daily., Disp: , Rfl:  .  aspirin EC 81 MG tablet, Take 81 mg by mouth every morning., Disp: , Rfl:  .  beta carotene w/minerals (OCUVITE) tablet, Take 1 tablet by mouth daily. , Disp: , Rfl:  .  betamethasone valerate ointment (VALISONE) 0.1 %, , Disp: , Rfl:  .  calcium-vitamin D (OSCAL) 250-125 MG-UNIT per tablet, Take 1 tablet by mouth every morning., Disp: , Rfl:  .  clotrimazole-betamethasone (LOTRISONE) cream, clotrimazole-betamethasone 1 %-0.05 % topical cream, Disp: , Rfl:  .  Cyanocobalamin (VITAMIN B 12 PO), Take 1 tablet by mouth every morning., Disp: , Rfl:  .  furosemide (LASIX) 40 MG tablet, Take 40 mg by mouth daily. , Disp: , Rfl:  .  gemfibrozil (LOPID) 600 MG tablet, Take 600 mg by mouth every morning., Disp: , Rfl:  .  loratadine (CLARITIN) 10 MG tablet, Take 10 mg by mouth daily as needed for allergies., Disp: , Rfl:  .  Multiple Vitamin (MULTIVITAMIN WITH  MINERALS) TABS tablet, Take 1 tablet by mouth every morning., Disp: , Rfl:  .  Omega-3 Fatty Acids (FISH OIL PO), Take 1 capsule by mouth daily with breakfast. , Disp: , Rfl:  .  pantoprazole (PROTONIX) 40 MG tablet, Take 40 mg by mouth daily., Disp: , Rfl:  .  Polyvinyl Alcohol-Povidone PF (REFRESH) 1.4-0.6 % SOLN, Place 6 drops into both eyes daily., Disp: , Rfl:  .  predniSONE (DELTASONE) 10 MG tablet, , Disp: , Rfl:  .  promethazine (PHENERGAN) 25 MG tablet, , Disp: , Rfl:  .  trolamine salicylate (ASPERCREME) 10 % cream, Apply 1 application topically as needed for muscle pain (as needed for back pain)., Disp: , Rfl:  .  ULORIC 40 MG tablet, , Disp: , Rfl:      has a past medical history of Abnormal tympanic membrane, Arthritis, Cancer of ascending colon ([place]) (02/15/2015), Constipation, chronic, COPD (chronic obstructive pulmonary disease) ([place]), Depression, Diverticulosis, GERD (gastroesophageal reflux disease), Glaucoma, Hemorrhoids, History of hiatal hernia, Hyperlipidemia, Hypertension, Insomnia, Keratosis, actinic, Leg cramps, Macular degeneration, Neuromuscular disorder ([place]), On aspirin at home, PONV (postoperative nausea and vomiting), Restless leg syndrome, Schatzki's ring (1987), Skin cancer, and Urinary incontinence.   reports that she has quit smoking. She has never used smokeless tobacco.  Past Surgical History:  Procedure Laterality Date  . ABDOMINAL HYSTERECTOMY    . BLADDER REPAIR    . CARDIAC CATHETERIZATION  2007, 2009  . CATARACT EXTRACTION Bilateral   . CHOLECYSTECTOMY    . COLECTOMY    . FINGER NAIL SURGERY    . FOOT SURGERY Right   . HEMORROIDECTOMY    . INNER EAR SURGERY Right    3-4 times  . INTRAOPERATIVE ARTERIOGRAM     right arm  . KNEE SURGERY Right   . LEG SURGERY Bilateral    laser ablation  . MASTOID DEBRIDEMENT     x2  . SKIN CANCER EXCISION     right/left  thumb nail, left face, right ear/neck, right leg  . TOE FUSION    . TONSILLECTOMY       Allergies  Allergen Reactions  . Azithromycin Itching  . Cephalexin   . Codeine Itching  . Gabapentin Swelling    Throat and leg swelling  . Niaspan [Niacin]   . Oxybutynin Other (See Comments)    "made me go crazy"-- had crazy dreams.   . Paxil [Paroxetine Hcl]     Unknown reaction  . Penicillins Itching    Has patient had a PCN reaction causing immediate rash, facial/tongue/throat swelling, SOB or lightheadedness with hypotension: No Has patient had a PCN reaction causing severe rash involving mucus membranes or skin necrosis: Yes Has patient had a PCN reaction that required hospitalization: No Has patient had a PCN reaction occurring within the last 10 years: No If all of the above answers are "NO", then may proceed with Cephalosporin use.   Marland Kitchen Pentazocine Itching and Nausea Only    Headache.   . Tizanidine Other (See Comments)    "made me crazy"  . Vicodin [Hydrocodone-Acetaminophen] Other (See Comments)    "made me go crazy"    Immunization History  Administered Date(s) Administered  . Influenza, High Dose Seasonal PF 12/03/2017    No family history on file.   Current Outpatient Medications:  .  acetaminophen (TYLENOL) 325 MG tablet, Take 650 mg by mouth every 6 (six) hours as needed for mild pain, moderate pain, fever or headache. , Disp: , Rfl:  .  allopurinol (ZYLOPRIM) 100 MG tablet, , Disp: , Rfl:  .  Artificial Tear Solution (SOOTHE HYDRATION) 1.25 % SOLN, Place 6 drops into both eyes daily., Disp: , Rfl:  .  Ascorbic Acid (VITAMIN C) 1000 MG tablet, Take 1,000 mg by mouth daily., Disp: , Rfl:  .  aspirin EC 81 MG tablet, Take 81 mg by mouth every morning., Disp: , Rfl:  .  beta carotene w/minerals (OCUVITE) tablet, Take 1 tablet by mouth daily. , Disp: , Rfl:  .  betamethasone valerate ointment (VALISONE) 0.1 %, , Disp: , Rfl:  .  calcium-vitamin D (OSCAL) 250-125 MG-UNIT per tablet, Take 1 tablet by mouth every morning., Disp: , Rfl:  .   clotrimazole-betamethasone (LOTRISONE) cream, clotrimazole-betamethasone 1 %-0.05 % topical cream, Disp: , Rfl:  .  Cyanocobalamin (VITAMIN B 12 PO), Take 1 tablet by mouth every morning., Disp: , Rfl:  .  furosemide (LASIX) 40 MG tablet, Take 40 mg by mouth daily. , Disp: , Rfl:  .  gemfibrozil (LOPID) 600 MG tablet, Take 600 mg by mouth every morning., Disp: , Rfl:  .  loratadine (CLARITIN) 10 MG tablet, Take 10 mg by mouth daily as needed for allergies., Disp: , Rfl:  .  Multiple Vitamin (MULTIVITAMIN WITH MINERALS) TABS tablet, Take 1 tablet by mouth every morning., Disp: , Rfl:  .  Omega-3 Fatty Acids (FISH OIL PO), Take 1 capsule by mouth daily with breakfast. , Disp: , Rfl:  .  pantoprazole (PROTONIX) 40 MG tablet, Take 40 mg by mouth daily., Disp: , Rfl:  .  Polyvinyl Alcohol-Povidone PF (REFRESH) 1.4-0.6 % SOLN, Place 6 drops into both eyes daily., Disp: , Rfl:  .  predniSONE (DELTASONE) 10 MG tablet, , Disp: , Rfl:  .  promethazine (PHENERGAN) 25 MG tablet, , Disp: , Rfl:  .  trolamine salicylate (ASPERCREME) 10 % cream, Apply 1 application topically as needed for muscle pain (as needed for back pain)., Disp: , Rfl:  .  ULORIC 40 MG tablet, , Disp: , Rfl:    Review of Systems     Objective:   Physical Exam Vitals:   05/24/18 1103  BP: 120/68  Pulse: 87  SpO2: 96%  Weight: 160 lb 9.6 oz (72.8 kg)  Height: 5' 3"  (1.6 m)    Estimated body mass index is 28.45 kg/m as calculated from the following:   Height as of this encounter: 5' 3"  (1.6 m).   Weight as of this encounter: 160 lb 9.6 oz (72.8 kg).  Obese lady.  Looks to be somewhat in chronic pain.  Has a walker.  Normal neuromuscular strength in the lower extremities.  Able to stand from a sitting position without any problem clear to auscultation normal heart sounds.  Flat affect.     Assessment:       ICD-10-CM   1. Dyspnea and respiratory abnormalities R06.00    R06.89        Plan:     Shortness of breath is  due to physical deconditioning, weight and back pain No evidence of lung disease No evidence of night o2 drop  Plan followup as needed Please address back issues with PCP Erica Pretty, MD or Dr Kathlene November     Dr. Brand Males, M.D., [facility].C.P Pulmonary and Critical Care Medicine Staff Physician, [place] Director - Interstitial Lung Disease  Program  Pulmonary [place] [facility] [place], [place], [address]  Pager: [phone], If no answer or between  15:00h - 7:00h: call [phone] Telephone: [phone]

## 2018-05-25 DIAGNOSIS — M546 Pain in thoracic spine: Secondary | ICD-10-CM | POA: Diagnosis not present

## 2018-05-25 DIAGNOSIS — M5136 Other intervertebral disc degeneration, lumbar region: Secondary | ICD-10-CM | POA: Diagnosis not present

## 2018-05-25 DIAGNOSIS — M62830 Muscle spasm of back: Secondary | ICD-10-CM | POA: Diagnosis not present

## 2018-06-03 DIAGNOSIS — N189 Chronic kidney disease, unspecified: Secondary | ICD-10-CM | POA: Diagnosis not present

## 2018-06-03 DIAGNOSIS — Z79899 Other long term (current) drug therapy: Secondary | ICD-10-CM | POA: Diagnosis not present

## 2018-06-03 DIAGNOSIS — E790 Hyperuricemia without signs of inflammatory arthritis and tophaceous disease: Secondary | ICD-10-CM | POA: Diagnosis not present

## 2018-06-03 DIAGNOSIS — M109 Gout, unspecified: Secondary | ICD-10-CM | POA: Diagnosis not present

## 2018-06-03 DIAGNOSIS — M79673 Pain in unspecified foot: Secondary | ICD-10-CM | POA: Diagnosis not present

## 2018-06-11 ENCOUNTER — Emergency Department (HOSPITAL_COMMUNITY): Payer: Medicare Other

## 2018-06-11 ENCOUNTER — Encounter (HOSPITAL_COMMUNITY): Payer: Self-pay | Admitting: *Deleted

## 2018-06-11 ENCOUNTER — Other Ambulatory Visit: Payer: Self-pay

## 2018-06-11 ENCOUNTER — Emergency Department (HOSPITAL_COMMUNITY)
Admission: EM | Admit: 2018-06-11 | Discharge: 2018-06-11 | Disposition: A | Discharge status: Home / Self Care | Payer: Medicare Other | Attending: Emergency Medicine | Admitting: Emergency Medicine

## 2018-06-11 DIAGNOSIS — R42 Dizziness and giddiness: Secondary | ICD-10-CM | POA: Diagnosis not present

## 2018-06-11 DIAGNOSIS — M545 Low back pain, unspecified: Secondary | ICD-10-CM

## 2018-06-11 DIAGNOSIS — Z7982 Long term (current) use of aspirin: Secondary | ICD-10-CM | POA: Insufficient documentation

## 2018-06-11 DIAGNOSIS — Z87891 Personal history of nicotine dependence: Secondary | ICD-10-CM | POA: Insufficient documentation

## 2018-06-11 DIAGNOSIS — G8929 Other chronic pain: Secondary | ICD-10-CM

## 2018-06-11 DIAGNOSIS — Z85828 Personal history of other malignant neoplasm of skin: Secondary | ICD-10-CM | POA: Diagnosis not present

## 2018-06-11 DIAGNOSIS — J449 Chronic obstructive pulmonary disease, unspecified: Secondary | ICD-10-CM | POA: Insufficient documentation

## 2018-06-11 DIAGNOSIS — I10 Essential (primary) hypertension: Secondary | ICD-10-CM | POA: Diagnosis not present

## 2018-06-11 DIAGNOSIS — R1011 Right upper quadrant pain: Secondary | ICD-10-CM | POA: Insufficient documentation

## 2018-06-11 DIAGNOSIS — K5730 Diverticulosis of large intestine without perforation or abscess without bleeding: Secondary | ICD-10-CM | POA: Diagnosis not present

## 2018-06-11 LAB — CBC
HCT: 34.6 % — ABNORMAL LOW (ref 36.0–46.0)
HEMOGLOBIN: 12.1 g/dL (ref 12.0–15.0)
MCH: 33.4 pg (ref 26.0–34.0)
MCHC: 35 g/dL (ref 30.0–36.0)
MCV: 95.6 fL (ref 78.0–100.0)
Platelets: 351 10*3/uL (ref 150–400)
RBC: 3.62 MIL/uL — AB (ref 3.87–5.11)
RDW: 13.2 % (ref 11.5–15.5)
WBC: 9.9 10*3/uL (ref 4.0–10.5)

## 2018-06-11 LAB — BASIC METABOLIC PANEL
ANION GAP: 9 (ref 5–15)
BUN: 44 mg/dL — ABNORMAL HIGH (ref 8–23)
CALCIUM: 9.2 mg/dL (ref 8.9–10.3)
CO2: 26 mmol/L (ref 22–32)
Chloride: 107 mmol/L (ref 98–111)
Creatinine, Ser: 1.39 mg/dL — ABNORMAL HIGH (ref 0.44–1.00)
GFR calc non Af Amer: 35 mL/min — ABNORMAL LOW (ref >60)
GFR, EST AFRICAN AMERICAN: 41 mL/min — AB (ref >60)
Glucose, Bld: 94 mg/dL (ref 70–99)
Potassium: 3.8 mmol/L (ref 3.5–5.1)
SODIUM: 142 mmol/L (ref 135–145)

## 2018-06-11 LAB — URINALYSIS, ROUTINE W REFLEX MICROSCOPIC
BILIRUBIN URINE: NEGATIVE
Glucose, UA: NEGATIVE mg/dL
Hgb urine dipstick: NEGATIVE
Ketones, ur: NEGATIVE mg/dL
Leukocytes, UA: NEGATIVE
NITRITE: NEGATIVE
PROTEIN: NEGATIVE mg/dL
SPECIFIC GRAVITY, URINE: 1.021 (ref 1.005–1.030)
pH: 5 (ref 5.0–8.0)

## 2018-06-11 MED ORDER — TRAMADOL HCL 50 MG PO TABS
50.0000 mg | ORAL_TABLET | Freq: Once | ORAL | Status: AC
  Administered 2018-06-11: 50 mg via ORAL
  Filled 2018-06-11: qty 1

## 2018-06-11 MED ORDER — ACETAMINOPHEN 500 MG PO TABS: 1000.0000 mg | ORAL_TABLET | Freq: Four times a day (QID) | ORAL | 0 refills | Status: DC | PRN

## 2018-06-11 MED ORDER — TRAMADOL HCL 50 MG PO TABS: 50.0000 mg | ORAL_TABLET | Freq: Four times a day (QID) | ORAL | 0 refills | Status: DC | PRN

## 2018-06-11 NOTE — Discharge Instructions (Signed)
1.  You may take 2 extra strength Tylenol and 1 tramadol tablet (every 6-8 hours) with breakfast, lunch and dinner for pain control for the next couple of days.  If you feel that the tramadol is making you dizzy, confused or unsteady, discontinue it. 2.  Call your orthopedic doctor or family doctor for a follow-up appointment regarding back pain and appropriate referrals or alternative treatments for back pain  as you described not tolerating pain medications very well.

## 2018-06-11 NOTE — ED Notes (Signed)
Medicated for back and abdominal pain-assisted to bathroom with stedy-void qs-assisted back to bed and reconnected to hardwire monitor

## 2018-06-11 NOTE — ED Triage Notes (Addendum)
Pt states she has fallen against the wall 3 times today due to dizziness, pt c/o of pain and burning all over both inside and outside, rt flank/side pain, pt continues to describe multiple complaints. Just finished z pack for back pain, stated by pt

## 2018-06-11 NOTE — ED Provider Notes (Signed)
[facility] DEPT Provider Note   CSN: [id number] Arrival date & time: 06/11/18  1807     History   Chief Complaint Chief Complaint  Patient presents with  . Dizziness  . Flank Pain    Right    HPI Erica Escobar is a 82 y.o. female.  HPI Patient has had left lower back pain and flank pain for about 3 weeks.  She has been seeing her doctor and has been given steroids.  She reports she just finished a steroid taper.  Reports she gets a sharp knifelike pain in her back which is the flank and iliac region on the left.  She reports it radiates up her back and to her buttock.  She denies any pain into her leg.  She denies weakness numbness or tingling of the legs.  She denies difficulty urinating.  Is worse with certain movements and positions.  She reports she has been unable to sleep for 3 nights due to pain.  She reports starting last night she began feeling kind of "swimmy headed".  She denies she has had any recent falls.  She reports she gets off balance and has to steady herself on the walls but has not fallen to the ground or struck her head.  No visual changes, no weakness numbness or tingling of extremities.  No confusion or speech changes.  Patient reports she does get episodes of dizziness that wax and wane.  She reports this is happened since she took a really hard fall in February.  Reports  at that time she did strike her head and has had problems with intermittent dizziness since that time.  She reports he also gets problems with abdominal pain and nausea due to her hiatal hernia. Past Medical History:  Diagnosis Date  . Abnormal tympanic membrane    right ear and mastoid problems  . Arthritis    oseoarthritis  . Cancer of ascending colon ([facility]) 02/15/2015  . Constipation, chronic   . COPD (chronic obstructive pulmonary disease) (HCC)    mild COPD  . Depression   . Diverticulosis   . GERD (gastroesophageal reflux disease)   . Glaucoma    bilateral-sugery with laser, using eye drops  . Hemorrhoids    Internal  . History of hiatal hernia    dx. '87 Schatzki's ring  . Hyperlipidemia    good control  . Hypertension   . Insomnia    hx. of  . Keratosis, actinic   . Leg cramps   . Macular degeneration   . Neuromuscular disorder (HCC)    neuropathy legs,   . On aspirin at home    chronic use  . PONV (postoperative nausea and vomiting)   . Restless leg syndrome   . Schatzki's ring 1987  . Skin cancer    multiple, "tx. area right anterior wrist"  . Urinary incontinence    occ, stress    Patient Active Problem List   Diagnosis Date Noted  . Cancer of ascending colon s/p robotic colectomy 02/15/2015 02/15/2015  . Restless leg syndrome   . Depression   . Insomnia   . COPD (chronic obstructive pulmonary disease) ([facility])   . GERD (gastroesophageal reflux disease)     Past Surgical History:  Procedure Laterality Date  . ABDOMINAL HYSTERECTOMY    . BLADDER REPAIR    . CARDIAC CATHETERIZATION  2007, 2009  . CATARACT EXTRACTION Bilateral   . CHOLECYSTECTOMY    . COLECTOMY    .  FINGER NAIL SURGERY    . FOOT SURGERY Right   . HEMORROIDECTOMY    . INNER EAR SURGERY Right    3-4 times  . INTRAOPERATIVE ARTERIOGRAM     right arm  . KNEE SURGERY Right   . LEG SURGERY Bilateral    laser ablation  . MASTOID DEBRIDEMENT     x2  . SKIN CANCER EXCISION     right/left thumb nail, left face, right ear/neck, right leg  . TOE FUSION    . TONSILLECTOMY       OB History   None      Home Medications    Prior to Admission medications   Medication Sig Start Date End Date Taking? Authorizing Provider  acetaminophen (TYLENOL) 500 MG tablet Take 500-1,000 mg by mouth every 6 (six) hours as needed for moderate pain.   Yes [provider]  aspirin EC 81 MG tablet Take 81 mg by mouth every morning.   Yes [provider]  beta carotene w/minerals (OCUVITE) tablet Take 1 tablet by mouth daily.    Yes  [provider]  calcium-vitamin D (OSCAL) 250-125 MG-UNIT per tablet Take 1 tablet by mouth every morning.   Yes [provider]  Cyanocobalamin (VITAMIN B 12 PO) Take 1,000 mg by mouth every morning.    Yes [provider]  furosemide (LASIX) 40 MG tablet Take 20 mg by mouth daily.    Yes [provider]  gemfibrozil (LOPID) 600 MG tablet Take 600 mg by mouth every morning.   Yes [provider]  meloxicam (MOBIC) 15 MG tablet Take 15 mg by mouth daily.  05/30/18  Yes [provider]  Multiple Vitamin (MULTIVITAMIN WITH MINERALS) TABS tablet Take 1 tablet by mouth every morning.   Yes [provider]  Omega-3 Fatty Acids (FISH OIL PO) Take 2 capsules by mouth daily with breakfast.    Yes [provider]  pantoprazole (PROTONIX) 40 MG tablet Take 40 mg by mouth daily. 03/09/17  Yes [provider]  polyethylene glycol (MIRALAX / GLYCOLAX) packet Take 17 g by mouth daily as needed for moderate constipation.   Yes [provider]  Polyvinyl Alcohol-Povidone PF (REFRESH) 1.4-0.6 % SOLN Place 6 drops into both eyes daily.   Yes [provider]  potassium chloride (K-DUR) 10 MEQ tablet Take 10 mEq by mouth daily.  06/08/18  Yes [provider]  predniSONE (DELTASONE) 5 MG tablet Take 5 mg by mouth daily with breakfast.  06/09/18  Yes [provider]  promethazine (PHENERGAN) 25 MG tablet Take 12.5 mg by mouth every 8 (eight) hours as needed for nausea or vomiting.   Yes [provider]  ULORIC 40 MG tablet Take 40 mg by mouth daily.  03/16/18  Yes [provider]  acetaminophen (TYLENOL) 500 MG tablet Take 2 tablets (1,000 mg total) by mouth every 6 (six) hours as needed. 06/11/18   Charlesetta Shanks, MD  loratadine (CLARITIN) 10 MG tablet Take 10 mg by mouth daily as needed for allergies.    [provider]  traMADol (ULTRAM) 50 MG tablet Take 1 tablet (50 mg total) by  mouth every 6 (six) hours as needed. 06/11/18   Charlesetta Shanks, MD  trolamine salicylate (ASPERCREME) 10 % cream Apply 1 application topically as needed for muscle pain (as needed for back pain).    [provider]    Family History No family history on file.  Social History Social History   Tobacco  Use  . Smoking status: Former Smoker  . Smokeless tobacco: Never Used  . Tobacco comment: short time x1 - during pregnancy  Substance Use Topics  . Alcohol use: No    Alcohol/week: 0.0 oz  . Drug use: No     Allergies   Azithromycin; Allopurinol; Cephalexin; Codeine; Gabapentin; Niaspan [niacin]; Oxybutynin; Paxil [paroxetine hcl]; Penicillins; Pentazocine; Tizanidine; and Vicodin [hydrocodone-acetaminophen]   Review of Systems Review of Systems 10 Systems reviewed and are negative for acute change except as noted in the HPI.   Physical Exam Updated Vital Signs BP (!) 192/88   Pulse 79   Temp 98 F (36.7 C) (Oral)   Resp 19   Ht 5' 3"  (1.6 m)   Wt 72.6 kg (160 lb)   SpO2 99%   BMI 28.34 kg/m   Physical Exam  Constitutional: She is oriented to person, place, and time.  Patient is alert and nontoxic.  Mental status clear.  Deconditioned with central obesity.  HENT:  Head: Normocephalic and atraumatic.  Eyes: EOM are normal.  Neck: Neck supple.  No C-spine tenderness.  Cardiovascular: Normal rate, regular rhythm, normal heart sounds and intact distal pulses.  Pulmonary/Chest: Effort normal and breath sounds normal.  Abdominal: Soft. She exhibits no distension. There is no tenderness. There is no guarding.  Musculoskeletal:  Examination of the patient's back shows it to be normal without rashes or lesions.  She does wince and endorse pain to palpation to the flank area on the left just above the wing of the iliac and slightly superior.  She does not endorse bony point tenderness over the central vertebral column.  Patient was examined in a standing position.   When I was pressing into the flank region she would wince and buckle her knee slightly on the left.  She was then again seated and lower extremity strength tested to be symmetric 5\5.  She did experience pain with having to twist and rotate to get back into the stretcher.  He again became more comfortable in a supine position on her right side.  Neurological: She is alert and oriented to person, place, and time. She exhibits normal muscle tone. Coordination normal.  Skin: Skin is warm.  Psychiatric: She has a normal mood and affect.     ED Treatments / Results  Labs (all labs ordered are listed, but only abnormal results are displayed) Labs Reviewed  BASIC METABOLIC PANEL - Abnormal; Notable for the following components:      Result Value   BUN 44 (*)    Creatinine, Ser 1.39 (*)    GFR calc non Af Amer 35 (*)    GFR calc Af Amer 41 (*)    All other components within normal limits  CBC - Abnormal; Notable for the following components:   RBC 3.62 (*)    HCT 34.6 (*)    All other components within normal limits  URINALYSIS, ROUTINE W REFLEX MICROSCOPIC  CBG MONITORING, ED    EKG EKG Interpretation  Date/Time:  Saturday June 11 2018 18:44:20 EDT Ventricular Rate:  89 PR Interval:    QRS Duration: 92 QT Interval:  353 QTC Calculation: 430 R Axis:   -13 Text Interpretation:  Sinus rhythm Low voltage, precordial leads Abnormal R-wave progression, early transition Borderline repolarization abnormality no sig change from previous Confirmed by Charlesetta Shanks [phone]) on 06/11/2018 8:00:11 PM   Radiology Ct Renal Stone Study  Result Date: 06/11/2018 CLINICAL DATA:  Right flank pain and burning. EXAM: CT ABDOMEN  AND PELVIS WITHOUT CONTRAST TECHNIQUE: Multidetector CT imaging of the abdomen and pelvis was performed following the standard protocol without IV contrast. COMPARISON:  01/01/2015 CT FINDINGS: Lower chest: Normal heart size without pericardial effusion. Small hiatal hernia.  Clear lung bases. Hepatobiliary: Cholecystectomy. The unenhanced liver is unremarkable. No biliary dilatation or definite mass. Pancreas: Normal without inflammation or ductal dilatation. Spleen: Normal without mass. Adrenals/Urinary Tract: Normal bilateral adrenal glands. No nephrolithiasis nor hydroureteronephrosis. Nondistended urinary bladder without focal mural thickening or calculi. Stomach/Bowel: Right hemicolectomy with sutures in place since the interim. No bowel obstruction or inflammation. Moderate stool burden within the colon. Colonic diverticulosis along the distal descending and sigmoid colon without acute diverticulitis. Vascular/Lymphatic: Small vascular phleboliths are seen adjacent to the ureters bilaterally. Mild aortoiliac atherosclerosis. Reproductive: Hysterectomy.  No adnexal mass. Other: No free air nor free fluid. Musculoskeletal: Thoracolumbar spondylosis with mild lower lumbar levoscoliosis. IMPRESSION: 1. No nephrolithiasis nor hydroureteronephrosis. 2. Status post right hemicolectomy. Increased colonic stool burden. No bowel obstruction or inflammation. 3. Left-sided colonic diverticulosis without acute diverticulitis. 4. Thoracolumbar spondylosis. Electronically Signed   By: Ashley Royalty M.D.   On: 06/11/2018 21:12    Procedures Procedures (including critical care time)  Medications Ordered in ED Medications  traMADol (ULTRAM) tablet 50 mg (50 mg Oral Given 06/11/18 2024)     Initial Impression / Assessment and Plan / ED Course  I have reviewed the triage vital signs and the nursing notes.  Pertinent labs & imaging results that were available during my care of the patient were reviewed by me and considered in my medical decision making (see chart for details).      Final Clinical Impressions(s) / ED Diagnoses   Final diagnoses:  Chronic left-sided low back pain without sciatica   Patient describes problems with left-sided lower back pain for weeks.  She has been  seeing her doctor and has been tried on steroids and been taking Tylenol without relief.  She does not have radiculopathy or lower extremity weakness.  Pain is off the midline in the flank area.  Urinalysis is negative and CT scan does not show any stone or other acute findings.  Pain is reproducible to palpation and patient experienced pain with position changes.  At this time appears most consistent musculoskeletal pain.  Patient has many allergies medication intolerances listed.  Tramadol was not included on this list.  He ordered a tramadol for the patient to try to help alleviate pain.  She reported to me that she told the nurse she had an allergy to tramadol and it was a medication that made her dizzy and fall.  She reports though that she was then agreeable to taking the medication.  She reports after she took it on the way to CT scan she sneezed really hard 4 or 5 times and she thinks it was due to the tramadol.  Now however she reports her pain is improved.  Discussed the fact that we are very limited in the medications that she can take for pain.  She may not have any NSAIDs due to chronic renal insufficiency.  She is aware of this.  She reports Vicodin "makes her crazy".  Now apparently the tramadol is not tolerated either both due to previous experience of dizziness and sneezing.  Patient did however plan to try it in conjunction with Tylenol as she wished for pain control.  She is counseled that if she finds it has adverse effects that she may discontinue it but there are  really no other prescription medications that are appropriate for her.  Is counseled to follow-up with her physician or orthopedic doctor to discuss possible interventional treatment or physical therapy. ED Discharge Orders        Ordered    traMADol (ULTRAM) 50 MG tablet  Every 6 hours PRN     06/11/18 2210    acetaminophen (TYLENOL) 500 MG tablet  Every 6 hours PRN     06/11/18 2210       Charlesetta Shanks, MD 06/11/18  2226

## 2018-06-11 NOTE — ED Notes (Signed)
CBG 81. Info not transferring.

## 2018-06-14 DIAGNOSIS — M545 Low back pain: Secondary | ICD-10-CM | POA: Diagnosis not present

## 2018-06-14 LAB — CBG MONITORING, ED: Glucose-Capillary: 81 mg/dL (ref 70–99)

## 2018-06-28 ENCOUNTER — Telehealth: Payer: Self-pay | Admitting: Oncology

## 2018-06-28 ENCOUNTER — Inpatient Hospital Stay: Payer: Medicare Other

## 2018-06-28 ENCOUNTER — Inpatient Hospital Stay: Payer: Medicare Other | Attending: Nurse Practitioner | Admitting: Oncology

## 2018-06-28 VITALS — BP 142/92 | HR 102 | Temp 97.8°F | Resp 18 | Ht 63.0 in | Wt 152.9 lb

## 2018-06-28 DIAGNOSIS — C182 Malignant neoplasm of ascending colon: Secondary | ICD-10-CM | POA: Diagnosis not present

## 2018-06-28 LAB — CBC WITH DIFFERENTIAL (CANCER CENTER ONLY)
BASOS PCT: 0 %
Basophils Absolute: 0 10*3/uL (ref 0.0–0.1)
EOS PCT: 1 %
Eosinophils Absolute: 0.1 10*3/uL (ref 0.0–0.5)
HCT: 31.6 % — ABNORMAL LOW (ref 34.8–46.6)
HEMOGLOBIN: 11.3 g/dL — AB (ref 11.6–15.9)
LYMPHS ABS: 1 10*3/uL (ref 0.9–3.3)
LYMPHS PCT: 11 %
MCH: 33.8 pg (ref 25.1–34.0)
MCHC: 35.7 g/dL (ref 31.5–36.0)
MCV: 94.7 fL (ref 79.5–101.0)
MONO ABS: 0.9 10*3/uL (ref 0.1–0.9)
Monocytes Relative: 11 %
Neutro Abs: 6.7 10*3/uL — ABNORMAL HIGH (ref 1.5–6.5)
Neutrophils Relative %: 77 %
PLATELETS: 312 10*3/uL (ref 145–400)
RBC: 3.33 MIL/uL — AB (ref 3.70–5.45)
RDW: 13.9 % (ref 11.2–14.5)
WBC Count: 8.7 10*3/uL (ref 3.9–10.3)

## 2018-06-28 LAB — SEDIMENTATION RATE: Sed Rate: 46 mm/hr — ABNORMAL HIGH (ref 0–22)

## 2018-06-28 LAB — CMP (CANCER CENTER ONLY)
ALBUMIN: 3.1 g/dL — AB (ref 3.5–5.0)
ALT: 9 U/L (ref 0–44)
AST: 17 U/L (ref 15–41)
Alkaline Phosphatase: 61 U/L (ref 38–126)
Anion gap: 11 (ref 5–15)
BILIRUBIN TOTAL: 0.5 mg/dL (ref 0.3–1.2)
BUN: 20 mg/dL (ref 8–23)
CHLORIDE: 103 mmol/L (ref 98–111)
CO2: 29 mmol/L (ref 22–32)
Calcium: 9.5 mg/dL (ref 8.9–10.3)
Creatinine: 1.39 mg/dL — ABNORMAL HIGH (ref 0.44–1.00)
GFR, Est AFR Am: 41 mL/min — ABNORMAL LOW (ref >60)
GFR, Estimated: 35 mL/min — ABNORMAL LOW (ref >60)
GLUCOSE: 96 mg/dL (ref 70–99)
POTASSIUM: 3.8 mmol/L (ref 3.5–5.1)
Sodium: 143 mmol/L (ref 135–145)
Total Protein: 6.2 g/dL — ABNORMAL LOW (ref 6.5–8.1)

## 2018-06-28 LAB — CEA (IN HOUSE-CHCC): CEA (CHCC-IN HOUSE): 1.75 ng/mL (ref 0.00–5.00)

## 2018-06-28 NOTE — Progress Notes (Signed)
Erica Escobar OFFICE PROGRESS NOTE   Diagnosis: Colon cancer  INTERVAL HISTORY:   Erica Escobar returns for a scheduled visit.  She continues to have severe "back "pain.  She describes pain in the right flank region.  The pain is worse with standing.  She gets relief when she lies down.  She is not taking pain medication.  She has nausea when the pain is severe. Erica Escobar was seen in the emergency room on 06/11/2018.  She was noted to have tenderness in the right flank region.  A CT renal stone study revealed no stone and no acute finding in the abdomen.  There is thoracolumbar spondylosis. She had a CT of the chest ordered by pulmonary medicine on 05/13/2018 that revealed no active pulmonary disease.  No aggressive appearing osseous lesions were seen.  Objective:  Vital signs in last 24 hours:  Blood pressure (!) 142/92, pulse (!) 102, temperature 97.8 F (36.6 C), temperature source Oral, resp. rate 18, height _0  (1.6 m), weight 152 lb 14.4 oz (69.4 kg), SpO2 100 %.    HEENT: Neck without mass Lymphatics: No cervical, supraclavicular, axillary, or inguinal nodes Resp: Lungs clear bilaterally Cardio: Regular rate and rhythm GI: No hepatosplenomegaly, soft, mild diffuse tenderness, no mass Vascular: No leg edema Neuro: The leg and foot strength are intact bilaterally Skin: No rash Skeletal: Tender at the right mid posterior chest wall.  No mass    Lab Results:  Lab Results  Component Value Date   WBC 8.7 06/28/2018   HGB 11.3 (L) 06/28/2018   HCT 31.6 (L) 06/28/2018   MCV 94.7 06/28/2018   PLT 312 06/28/2018   NEUTROABS 6.7 (H) 06/28/2018    CMP  Lab Results  Component Value Date   NA 143 06/28/2018   K 3.8 06/28/2018   CL 103 06/28/2018   CO2 29 06/28/2018   GLUCOSE 96 06/28/2018   BUN 20 06/28/2018   CREATININE 1.39 (H) 06/28/2018   CALCIUM 9.5 06/28/2018   PROT 6.2 (L) 06/28/2018   ALBUMIN 3.1 (L) 06/28/2018   AST 17 06/28/2018   ALT 9  06/28/2018   ALKPHOS 61 06/28/2018   BILITOT 0.5 06/28/2018   GFRNONAA 35 (L) 06/28/2018   GFRAA 41 (L) 06/28/2018    Lab Results  Component Value Date   CEA1 1.75 06/28/2018     Medications: I have reviewed the patient's current medications.   Assessment/Plan: 1. 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 Loss of MLH1 and PMS2, MSI-high, BRAF mutation detected ? Colonoscopy February 2017 ? Colonoscopy 12/16/2017-3 mm polyp in the transverse colon, 10 mm polyp in the sigmoid colon. Diverticulosis in the sigmoid colon. Transverse colon polyp, tubular adenoma; sigmoid colon polyp, tubular adenoma.  2. Anemia-likely secondary to bleeding from the colon cancer and surgery. Hemoglobin in normal range 03/20/2016, mild persistent anemia 3. Ascending colon "polyps" removed on the colonoscopy 12/22/2014 with the pathology revealing poorly differentiated adenocarcinoma and a sessile serrated adenoma polyp, similar to the pathology on the hepatic flexure mass 4. History of multiple skin cancers. Seen by dermatology every 3 months. 5. Glaucoma 6.  Severe back pain-etiology unclear 7.  Anorexia/weight loss   Disposition: Erica Escobar remains in clinical remission from colon cancer.  She continues to have severe back pain of unclear etiology.  There is no clear explanation for the pain.  She is losing weight. Recent CTs of the chest and abdomen  have not revealed evidence of recurrent colon cancer or another malignancy.  I will reviewed the CT images and radiology with attention to the thoracic and lumbar spine.  I contacted Dr. Shelia Media.  He will evaluate Erica Escobar this week.  We checked an erythrocyte sedimentation rate today.  Erica Escobar will return for an office visit in 2 months.  I am available to see her sooner as needed.  25 minutes were spent with the patient today.  The majority of the time was used  for counseling and coordination of care.  Betsy Coder, MD  06/28/2018  12:57 PM

## 2018-06-28 NOTE — Telephone Encounter (Signed)
Gave patient avs report and appointments for September.  °

## 2018-06-29 DIAGNOSIS — M5440 Lumbago with sciatica, unspecified side: Secondary | ICD-10-CM | POA: Diagnosis not present

## 2018-06-29 DIAGNOSIS — R42 Dizziness and giddiness: Secondary | ICD-10-CM | POA: Diagnosis not present

## 2018-06-29 DIAGNOSIS — Z85038 Personal history of other malignant neoplasm of large intestine: Secondary | ICD-10-CM | POA: Diagnosis not present

## 2018-06-29 DIAGNOSIS — S0990XA Unspecified injury of head, initial encounter: Secondary | ICD-10-CM | POA: Diagnosis not present

## 2018-06-30 ENCOUNTER — Other Ambulatory Visit (HOSPITAL_COMMUNITY): Payer: Self-pay | Admitting: Internal Medicine

## 2018-06-30 DIAGNOSIS — Z85038 Personal history of other malignant neoplasm of large intestine: Secondary | ICD-10-CM

## 2018-06-30 DIAGNOSIS — M5440 Lumbago with sciatica, unspecified side: Principal | ICD-10-CM

## 2018-06-30 DIAGNOSIS — G8929 Other chronic pain: Secondary | ICD-10-CM

## 2018-07-01 DIAGNOSIS — M546 Pain in thoracic spine: Secondary | ICD-10-CM | POA: Diagnosis not present

## 2018-07-04 ENCOUNTER — Other Ambulatory Visit: Payer: Self-pay | Admitting: Internal Medicine

## 2018-07-04 DIAGNOSIS — S0990XA Unspecified injury of head, initial encounter: Secondary | ICD-10-CM

## 2018-07-06 DIAGNOSIS — R2681 Unsteadiness on feet: Secondary | ICD-10-CM | POA: Diagnosis not present

## 2018-07-06 DIAGNOSIS — D649 Anemia, unspecified: Secondary | ICD-10-CM | POA: Diagnosis not present

## 2018-07-06 DIAGNOSIS — Z85038 Personal history of other malignant neoplasm of large intestine: Secondary | ICD-10-CM | POA: Diagnosis not present

## 2018-07-06 DIAGNOSIS — I10 Essential (primary) hypertension: Secondary | ICD-10-CM | POA: Diagnosis not present

## 2018-07-07 ENCOUNTER — Encounter (HOSPITAL_COMMUNITY): Payer: Self-pay

## 2018-07-07 ENCOUNTER — Emergency Department (HOSPITAL_COMMUNITY)
Admission: EM | Admit: 2018-07-07 | Discharge: 2018-07-07 | Disposition: A | Discharge status: Home / Self Care | Payer: Medicare Other | Attending: Emergency Medicine | Admitting: Emergency Medicine

## 2018-07-07 ENCOUNTER — Emergency Department (HOSPITAL_COMMUNITY): Payer: Medicare Other

## 2018-07-07 ENCOUNTER — Other Ambulatory Visit: Payer: Self-pay

## 2018-07-07 DIAGNOSIS — M79605 Pain in left leg: Secondary | ICD-10-CM | POA: Diagnosis not present

## 2018-07-07 DIAGNOSIS — M47896 Other spondylosis, lumbar region: Secondary | ICD-10-CM | POA: Diagnosis not present

## 2018-07-07 DIAGNOSIS — M25552 Pain in left hip: Secondary | ICD-10-CM | POA: Diagnosis not present

## 2018-07-07 DIAGNOSIS — Z7982 Long term (current) use of aspirin: Secondary | ICD-10-CM | POA: Diagnosis not present

## 2018-07-07 DIAGNOSIS — Z87891 Personal history of nicotine dependence: Secondary | ICD-10-CM | POA: Insufficient documentation

## 2018-07-07 DIAGNOSIS — R531 Weakness: Secondary | ICD-10-CM | POA: Diagnosis present

## 2018-07-07 DIAGNOSIS — M47816 Spondylosis without myelopathy or radiculopathy, lumbar region: Secondary | ICD-10-CM | POA: Diagnosis not present

## 2018-07-07 DIAGNOSIS — M25551 Pain in right hip: Secondary | ICD-10-CM | POA: Diagnosis not present

## 2018-07-07 DIAGNOSIS — M5489 Other dorsalgia: Secondary | ICD-10-CM | POA: Diagnosis not present

## 2018-07-07 DIAGNOSIS — Z79899 Other long term (current) drug therapy: Secondary | ICD-10-CM | POA: Insufficient documentation

## 2018-07-07 DIAGNOSIS — S79911A Unspecified injury of right hip, initial encounter: Secondary | ICD-10-CM | POA: Diagnosis not present

## 2018-07-07 DIAGNOSIS — S79912A Unspecified injury of left hip, initial encounter: Secondary | ICD-10-CM | POA: Diagnosis not present

## 2018-07-07 MED ORDER — ACETAMINOPHEN 500 MG PO TABS
1000.0000 mg | ORAL_TABLET | Freq: Once | ORAL | Status: AC
  Administered 2018-07-07: 1000 mg via ORAL
  Filled 2018-07-07: qty 2

## 2018-07-07 NOTE — ED Provider Notes (Signed)
[facility] DEPT Provider Note   CSN: [id number] Arrival date & time: 07/07/18  2100     History   Chief Complaint Chief Complaint  Patient presents with  . Fall  . Weakness    HPI Erica Escobar is a 82 y.o. female.  HPI   She presents for evaluation of pain in the left leg, radiating from the left pelvic region to the left lower thigh.  She also has ongoing chronic back pain.  She saw her PCP yesterday, for follow-up on recent initiation of treatment for depression and poor appetite.  At that time the PCP talked about referring her to a neurosurgeon for her chronic back pain.  She had a back injection about 1 month ago, by her orthopedist, for chronic back pain.  She denies fever, chills, vomiting, dizziness, dysuria or urinary frequency.  She came here with her son by private vehicle for evaluation.  There are no other no modifying factors.  Past Medical History:  Diagnosis Date  . Abnormal tympanic membrane    right ear and mastoid problems  . Arthritis    oseoarthritis  . Cancer of ascending colon ([place]) 02/15/2015  . Constipation, chronic   . COPD (chronic obstructive pulmonary disease) (HCC)    mild COPD  . Depression   . Diverticulosis   . GERD (gastroesophageal reflux disease)   . Glaucoma    bilateral-sugery with laser, using eye drops  . Hemorrhoids    Internal  . History of hiatal hernia    dx. '87 Schatzki's ring  . Hyperlipidemia    good control  . Hypertension   . Insomnia    hx. of  . Keratosis, actinic   . Leg cramps   . Macular degeneration   . Neuromuscular disorder (HCC)    neuropathy legs,   . On aspirin at home    chronic use  . PONV (postoperative nausea and vomiting)   . Restless leg syndrome   . Schatzki's ring 1987  . Skin cancer    multiple, "tx. area right anterior wrist"  . Urinary incontinence    occ, stress    Patient Active Problem List   Diagnosis Date Noted  . Cancer of ascending colon s/p  robotic colectomy 02/15/2015 02/15/2015  . Restless leg syndrome   . Depression   . Insomnia   . COPD (chronic obstructive pulmonary disease) ([place])   . GERD (gastroesophageal reflux disease)     Past Surgical History:  Procedure Laterality Date  . ABDOMINAL HYSTERECTOMY    . BLADDER REPAIR    . CARDIAC CATHETERIZATION  2007, 2009  . CATARACT EXTRACTION Bilateral   . CHOLECYSTECTOMY    . COLECTOMY    . FINGER NAIL SURGERY    . FOOT SURGERY Right   . HEMORROIDECTOMY    . INNER EAR SURGERY Right    3-4 times  . INTRAOPERATIVE ARTERIOGRAM     right arm  . KNEE SURGERY Right   . LEG SURGERY Bilateral    laser ablation  . MASTOID DEBRIDEMENT     x2  . SKIN CANCER EXCISION     right/left thumb nail, left face, right ear/neck, right leg  . TOE FUSION    . TONSILLECTOMY       OB History   None      Home Medications    Prior to Admission medications   Medication Sig Start Date End Date Taking? Authorizing Provider  acetaminophen (TYLENOL) 500 MG tablet Take 2  tablets (1,000 mg total) by mouth every 6 (six) hours as needed. 06/11/18  Yes Charlesetta Shanks, MD  aspirin EC 81 MG tablet Take 81 mg by mouth every morning.   Yes [provider]  beta carotene w/minerals (OCUVITE) tablet Take 1 tablet by mouth daily.    Yes [provider]  calcium-vitamin D (OSCAL) 250-125 MG-UNIT per tablet Take 1 tablet by mouth every morning.   Yes [provider]  Cyanocobalamin (VITAMIN B 12 PO) Take 1,000 mg by mouth every morning.    Yes [provider]  cyclobenzaprine (FLEXERIL) 5 MG tablet Take 5 mg by mouth 3 (three) times daily. 06/29/18  Yes [provider]  furosemide (LASIX) 40 MG tablet Take 20 mg by mouth daily.    Yes [provider]  gemfibrozil (LOPID) 600 MG tablet Take 600 mg by mouth every morning.   Yes [provider]  loratadine (CLARITIN) 10 MG tablet Take 10 mg by mouth daily as needed for allergies.   Yes  [provider]  meloxicam (MOBIC) 15 MG tablet Take 15 mg by mouth daily.  05/30/18  Yes [provider]  mirtazapine (REMERON) 15 MG tablet Take 7.5 mg by mouth at bedtime. 06/29/18  Yes [provider]  Multiple Vitamin (MULTIVITAMIN WITH MINERALS) TABS tablet Take 1 tablet by mouth every morning.   Yes [provider]  Omega-3 Fatty Acids (FISH OIL PO) Take 2 capsules by mouth daily with breakfast.    Yes [provider]  pantoprazole (PROTONIX) 40 MG tablet Take 40 mg by mouth daily. 03/09/17  Yes [provider]  polyethylene glycol (MIRALAX / GLYCOLAX) packet Take 17 g by mouth daily as needed for moderate constipation.   Yes [provider]  Polyvinyl Alcohol-Povidone PF (REFRESH) 1.4-0.6 % SOLN Place 6 drops into both eyes daily.   Yes [provider]  potassium chloride (K-DUR) 10 MEQ tablet Take 10 mEq by mouth daily.  06/08/18  Yes [provider]  promethazine (PHENERGAN) 25 MG tablet Take 12.5 mg by mouth every 8 (eight) hours as needed for nausea or vomiting.   Yes [provider]  trolamine salicylate (ASPERCREME) 10 % cream Apply 1 application topically as needed for muscle pain (as needed for back pain).   Yes [provider]  ULORIC 40 MG tablet Take 40 mg by mouth daily.  03/16/18  Yes [provider]    Family History History reviewed. No pertinent family history.  Social History Social History   Tobacco Use  . Smoking status: Former Research scientist (life sciences)  . Smokeless tobacco: Never Used  . Tobacco comment: short time x1 - during pregnancy  Substance Use Topics  . Alcohol use: No    Alcohol/week: 0.0 oz  . Drug use: No     Allergies   Azithromycin; Allopurinol; Cephalexin; Codeine; Gabapentin; Niaspan [niacin]; Oxybutynin; Paxil [paroxetine hcl]; Penicillins; Pentazocine; Tizanidine; Tramadol; and Vicodin [hydrocodone-acetaminophen]   Review of Systems Review of Systems    All other systems reviewed and are negative.    Physical Exam Updated Vital Signs BP (!) 167/109 (BP Location: Left Arm)   Pulse (!) 105   Temp 98.2 F (36.8 C) (Oral)   Resp 18   SpO2 94%   Physical Exam  Constitutional: She is oriented to person, place, and time. She appears well-developed and well-nourished.  HENT:  Head: Normocephalic and atraumatic.  Eyes: Pupils are equal, round, and reactive to light. Conjunctivae and EOM are normal.  Neck: Normal  range of motion and phonation normal. Neck supple.  Cardiovascular: Normal rate and regular rhythm.  Pulmonary/Chest: Effort normal and breath sounds normal. She exhibits no tenderness.  Musculoskeletal:  Patient is able to move to sit up on the edge of the bed, with minimal assistance.  Negative straight leg raising bilaterally.  No pain with mobilization of the hips bilaterally, by me.  Mild tenderness lower lumbar area diffusely.  Neurological: She is alert and oriented to person, place, and time. She exhibits normal muscle tone.  Skin: Skin is warm and dry.  Psychiatric: She has a normal mood and affect. Her behavior is normal. Judgment and thought content normal.  Nursing note and vitals reviewed.    ED Treatments / Results  Labs (all labs ordered are listed, but only abnormal results are displayed) Labs Reviewed - No data to display  EKG None  Radiology Dg Hip Unilat With Pelvis 2-3 Views Left  Result Date: 07/07/2018 CLINICAL DATA:  Fall at home today. Weakness and difficulty walking for the past few months. Bilateral hip pain, worse on the left. EXAM: DG HIP (WITH OR WITHOUT PELVIS) 2-3V RIGHT; DG HIP (WITH OR WITHOUT PELVIS) 2-3V LEFT COMPARISON:  CT abdomen and pelvis 06/11/2018 FINDINGS: Single view of the pelvis including the hips with two views of both hips obtained. Degenerative changes in the lower lumbar spine and in both hips. Diffuse bone demineralization. No evidence of acute fracture or dislocation  involving the pelvis, right hip, or left hip. SI joints and symphysis pubis are not displaced. Calcified phleboliths in pelvis. IMPRESSION: Degenerative changes in the hips. No acute displaced fractures identified. Electronically Signed   By: Lucienne Capers M.D.   On: 07/07/2018 22:20   Dg Hip Unilat  With Pelvis 2-3 Views Right  Result Date: 07/07/2018 CLINICAL DATA:  Fall at home today. Weakness and difficulty walking for the past few months. Bilateral hip pain, worse on the left. EXAM: DG HIP (WITH OR WITHOUT PELVIS) 2-3V RIGHT; DG HIP (WITH OR WITHOUT PELVIS) 2-3V LEFT COMPARISON:  CT abdomen and pelvis 06/11/2018 FINDINGS: Single view of the pelvis including the hips with two views of both hips obtained. Degenerative changes in the lower lumbar spine and in both hips. Diffuse bone demineralization. No evidence of acute fracture or dislocation involving the pelvis, right hip, or left hip. SI joints and symphysis pubis are not displaced. Calcified phleboliths in pelvis. IMPRESSION: Degenerative changes in the hips. No acute displaced fractures identified. Electronically Signed   By: Lucienne Capers M.D.   On: 07/07/2018 22:20    Procedures Procedures (including critical care time)  Medications Ordered in ED Medications  acetaminophen (TYLENOL) tablet 1,000 mg (has no administration in time range)     Initial Impression / Assessment and Plan / ED Course  I have reviewed the triage vital signs and the nursing notes.  Pertinent labs & imaging results that were available during my care of the patient were reviewed by me and considered in my medical decision making (see chart for details).  Clinical Course as of Jul 08 2307  Thu Jul 07, 2018  2258 No fracture or significant arthritis images reviewed by me  DG Hip Unilat  With Pelvis 2-3 Views Right [EW]  2258 No fracture or significant arthritis, images reviewed by me  DG Hip Unilat With Pelvis 2-3 Views Left [EW]    Clinical Course User  Index [EW] Daleen Bo, MD     Patient Vitals for the past 24 hrs:  BP  Temp Temp src Pulse Resp SpO2  07/07/18 2119 (!) 167/109 98.2 F (36.8 C) Oral (!) 105 18 94 %    10:59 PM Reevaluation with update and discussion. After initial assessment and treatment, an updated evaluation reveals no change in clinical status, findings discussed with patient and son, all questions answered. Daleen Bo   Medical Decision Making: Left leg pain with back pain, and chronic back disorder.  I was able to review some spine images of the patient's lumbar region, with the patient's son and the patient.  It shows significant spondylosis with shift of multiple lumbar vertebrae, due to the arthritis.  This likely represents the source of her back and leg pain.  She appears to have a lumbar radiculopathy with left hip and thigh pain.  Doubt fracture, myelopathy, intraperitoneal abnormality.  CRITICAL CARE-no Performed by: Daleen Bo   Nursing Notes Reviewed/ Care Coordinated Applicable Imaging Reviewed Interpretation of Laboratory Data incorporated into ED treatment  The patient appears reasonably screened and/or stabilized for discharge and I doubt any other medical condition or other [facility] requiring further screening, evaluation, or treatment in the ED at this time prior to discharge.  Plan: Home Medications-APAP for pain, usual medications; Home Treatments-rest, fluids, heat to affected area; return here if the recommended treatment, does not improve the symptoms; Recommended follow up-PCP as needed for ongoing management of chronic back pain, with possible referral to neurosurgery    Final Clinical Impressions(s) / ED Diagnoses   Final diagnoses:  Lumbar spondylosis    ED Discharge Orders    None       Daleen Bo, MD 07/07/18 2308

## 2018-07-07 NOTE — Discharge Instructions (Addendum)
Use heat on your lower back 3 or 4 times a day.  For pain use Tylenol every 4 hours.

## 2018-07-07 NOTE — ED Triage Notes (Signed)
Pt reports that she been having increased weakness and is unable to ambulate at her baseline.  pt denies n/v/d. Pt reports that she saw her PCP yesterday and was placed on new medications Cyclobenzaprine and mirtazaprine. Pt report that she had a fall on Tuesday and did not go to ED,  pt reports that she hurt her left hip and is c/o 10/10 sharp pain to bilateral hips and back.  Pt is escorted with Son. Pt is moaning in discomfort.

## 2018-07-08 ENCOUNTER — Other Ambulatory Visit: Payer: Self-pay

## 2018-07-08 ENCOUNTER — Emergency Department (HOSPITAL_COMMUNITY): Payer: Medicare Other

## 2018-07-08 ENCOUNTER — Emergency Department (HOSPITAL_COMMUNITY)
Admission: EM | Admit: 2018-07-08 | Discharge: 2018-07-08 | Disposition: A | Discharge status: Home / Self Care | Payer: Medicare Other | Attending: Emergency Medicine | Admitting: Emergency Medicine

## 2018-07-08 ENCOUNTER — Encounter (HOSPITAL_COMMUNITY): Payer: Self-pay

## 2018-07-08 DIAGNOSIS — Z79899 Other long term (current) drug therapy: Secondary | ICD-10-CM | POA: Diagnosis not present

## 2018-07-08 DIAGNOSIS — R262 Difficulty in walking, not elsewhere classified: Secondary | ICD-10-CM | POA: Diagnosis not present

## 2018-07-08 DIAGNOSIS — Y9301 Activity, walking, marching and hiking: Secondary | ICD-10-CM | POA: Diagnosis not present

## 2018-07-08 DIAGNOSIS — Y999 Unspecified external cause status: Secondary | ICD-10-CM | POA: Insufficient documentation

## 2018-07-08 DIAGNOSIS — W01198A Fall on same level from slipping, tripping and stumbling with subsequent striking against other object, initial encounter: Secondary | ICD-10-CM | POA: Insufficient documentation

## 2018-07-08 DIAGNOSIS — I10 Essential (primary) hypertension: Secondary | ICD-10-CM | POA: Diagnosis not present

## 2018-07-08 DIAGNOSIS — Z043 Encounter for examination and observation following other accident: Secondary | ICD-10-CM | POA: Diagnosis present

## 2018-07-08 DIAGNOSIS — Y92012 Bathroom of single-family (private) house as the place of occurrence of the external cause: Secondary | ICD-10-CM | POA: Diagnosis not present

## 2018-07-08 DIAGNOSIS — M545 Low back pain: Secondary | ICD-10-CM | POA: Diagnosis not present

## 2018-07-08 DIAGNOSIS — S79912A Unspecified injury of left hip, initial encounter: Secondary | ICD-10-CM | POA: Diagnosis not present

## 2018-07-08 DIAGNOSIS — Z85038 Personal history of other malignant neoplasm of large intestine: Secondary | ICD-10-CM | POA: Diagnosis not present

## 2018-07-08 DIAGNOSIS — J449 Chronic obstructive pulmonary disease, unspecified: Secondary | ICD-10-CM | POA: Insufficient documentation

## 2018-07-08 DIAGNOSIS — W19XXXA Unspecified fall, initial encounter: Secondary | ICD-10-CM | POA: Diagnosis not present

## 2018-07-08 DIAGNOSIS — R0789 Other chest pain: Secondary | ICD-10-CM | POA: Diagnosis not present

## 2018-07-08 DIAGNOSIS — M25552 Pain in left hip: Secondary | ICD-10-CM | POA: Diagnosis not present

## 2018-07-08 DIAGNOSIS — R269 Unspecified abnormalities of gait and mobility: Secondary | ICD-10-CM | POA: Diagnosis not present

## 2018-07-08 DIAGNOSIS — S3992XA Unspecified injury of lower back, initial encounter: Secondary | ICD-10-CM | POA: Diagnosis not present

## 2018-07-08 DIAGNOSIS — I959 Hypotension, unspecified: Secondary | ICD-10-CM | POA: Diagnosis not present

## 2018-07-08 LAB — BASIC METABOLIC PANEL
Anion gap: 10 (ref 5–15)
BUN: 24 mg/dL — AB (ref 8–23)
CO2: 28 mmol/L (ref 22–32)
CREATININE: 1.48 mg/dL — AB (ref 0.44–1.00)
Calcium: 9.8 mg/dL (ref 8.9–10.3)
Chloride: 102 mmol/L (ref 98–111)
GFR calc Af Amer: 38 mL/min — ABNORMAL LOW (ref >60)
GFR, EST NON AFRICAN AMERICAN: 32 mL/min — AB (ref >60)
Glucose, Bld: 103 mg/dL — ABNORMAL HIGH (ref 70–99)
Potassium: 3.4 mmol/L — ABNORMAL LOW (ref 3.5–5.1)
SODIUM: 140 mmol/L (ref 135–145)

## 2018-07-08 LAB — URINALYSIS, ROUTINE W REFLEX MICROSCOPIC
Bilirubin Urine: NEGATIVE
GLUCOSE, UA: NEGATIVE mg/dL
Hgb urine dipstick: NEGATIVE
KETONES UR: NEGATIVE mg/dL
Leukocytes, UA: NEGATIVE
NITRITE: NEGATIVE
PH: 5 (ref 5.0–8.0)
Protein, ur: NEGATIVE mg/dL
Specific Gravity, Urine: 1.013 (ref 1.005–1.030)

## 2018-07-08 LAB — CBC WITH DIFFERENTIAL/PLATELET
BASOS ABS: 0 10*3/uL (ref 0.0–0.1)
Basophils Relative: 0 %
EOS ABS: 0.1 10*3/uL (ref 0.0–0.7)
EOS PCT: 1 %
HCT: 29.6 % — ABNORMAL LOW (ref 36.0–46.0)
Hemoglobin: 10.7 g/dL — ABNORMAL LOW (ref 12.0–15.0)
Lymphocytes Relative: 13 %
Lymphs Abs: 0.8 10*3/uL (ref 0.7–4.0)
MCH: 33.9 pg (ref 26.0–34.0)
MCHC: 36.1 g/dL — ABNORMAL HIGH (ref 30.0–36.0)
MCV: 93.7 fL (ref 78.0–100.0)
Monocytes Absolute: 0.8 10*3/uL (ref 0.1–1.0)
Monocytes Relative: 12 %
Neutro Abs: 4.7 10*3/uL (ref 1.7–7.7)
Neutrophils Relative %: 74 %
Platelets: 347 10*3/uL (ref 150–400)
RBC: 3.16 MIL/uL — AB (ref 3.87–5.11)
RDW: 13.4 % (ref 11.5–15.5)
WBC: 6.4 10*3/uL (ref 4.0–10.5)

## 2018-07-08 MED ORDER — FENTANYL CITRATE (PF) 100 MCG/2ML IJ SOLN
25.0000 ug | Freq: Once | INTRAMUSCULAR | Status: AC
  Administered 2018-07-08: 25 ug via INTRAVENOUS
  Filled 2018-07-08: qty 2

## 2018-07-08 MED ORDER — ACETAMINOPHEN 500 MG PO TABS
1000.0000 mg | ORAL_TABLET | Freq: Once | ORAL | Status: AC
  Administered 2018-07-08: 1000 mg via ORAL
  Filled 2018-07-08: qty 2

## 2018-07-08 MED ORDER — OXYCODONE-ACETAMINOPHEN 5-325 MG PO TABS: 1.0000 | ORAL_TABLET | ORAL | 0 refills | Status: DC | PRN

## 2018-07-08 NOTE — ED Provider Notes (Signed)
[facility] DEPT Provider Note   CSN: [id number] Arrival date & time: 07/08/18  1535     History   Chief Complaint Chief Complaint  Patient presents with  . Fall    HPI Erica Escobar is a 82 y.o. female.  Pt presents to the ED today with fall and left side pain.  Pt walks with a walker and was walking into the bathroom.  The pt said her legs became wobby and gave out.  She fell, hitting her left side against the sink.  She does not think she hit her head.  No loc.  She was not able to stand up, but crawled to the phone where she called 911.  No blood thinners.  She was here last night for her chronic back pain.     Past Medical History:  Diagnosis Date  . Abnormal tympanic membrane    right ear and mastoid problems  . Arthritis    oseoarthritis  . Cancer of ascending colon ([place]) 02/15/2015  . Constipation, chronic   . COPD (chronic obstructive pulmonary disease) (HCC)    mild COPD  . Depression   . Diverticulosis   . GERD (gastroesophageal reflux disease)   . Glaucoma    bilateral-sugery with laser, using eye drops  . Hemorrhoids    Internal  . History of hiatal hernia    dx. '87 Schatzki's ring  . Hyperlipidemia    good control  . Hypertension   . Insomnia    hx. of  . Keratosis, actinic   . Leg cramps   . Macular degeneration   . Neuromuscular disorder (HCC)    neuropathy legs,   . On aspirin at home    chronic use  . PONV (postoperative nausea and vomiting)   . Restless leg syndrome   . Schatzki's ring 1987  . Skin cancer    multiple, "tx. area right anterior wrist"  . Urinary incontinence    occ, stress    Patient Active Problem List   Diagnosis Date Noted  . Cancer of ascending colon s/p robotic colectomy 02/15/2015 02/15/2015  . Restless leg syndrome   . Depression   . Insomnia   . COPD (chronic obstructive pulmonary disease) ([place])   . GERD (gastroesophageal reflux disease)     Past Surgical History:    Procedure Laterality Date  . ABDOMINAL HYSTERECTOMY    . BLADDER REPAIR    . CARDIAC CATHETERIZATION  2007, 2009  . CATARACT EXTRACTION Bilateral   . CHOLECYSTECTOMY    . COLECTOMY    . FINGER NAIL SURGERY    . FOOT SURGERY Right   . HEMORROIDECTOMY    . INNER EAR SURGERY Right    3-4 times  . INTRAOPERATIVE ARTERIOGRAM     right arm  . KNEE SURGERY Right   . LEG SURGERY Bilateral    laser ablation  . MASTOID DEBRIDEMENT     x2  . SKIN CANCER EXCISION     right/left thumb nail, left face, right ear/neck, right leg  . TOE FUSION    . TONSILLECTOMY       OB History   None      Home Medications    Prior to Admission medications   Medication Sig Start Date End Date Taking? Authorizing Provider  acetaminophen (TYLENOL) 500 MG tablet Take 2 tablets (1,000 mg total) by mouth every 6 (six) hours as needed. 06/11/18  Yes Charlesetta Shanks, MD  aspirin EC 81 MG tablet Take 81  mg by mouth every morning.   Yes [provider]  beta carotene w/minerals (OCUVITE) tablet Take 1 tablet by mouth daily.    Yes [provider]  calcium-vitamin D (OSCAL) 250-125 MG-UNIT per tablet Take 1 tablet by mouth every morning.   Yes [provider]  Cyanocobalamin (VITAMIN B 12 PO) Take 1,000 mg by mouth every morning.    Yes [provider]  cyclobenzaprine (FLEXERIL) 5 MG tablet Take 5 mg by mouth 3 (three) times daily. 06/29/18  Yes [provider]  furosemide (LASIX) 40 MG tablet Take 20 mg by mouth daily.    Yes [provider]  gemfibrozil (LOPID) 600 MG tablet Take 600 mg by mouth every morning.   Yes [provider]  meloxicam (MOBIC) 15 MG tablet Take 15 mg by mouth daily.  05/30/18  Yes [provider]  mirtazapine (REMERON) 15 MG tablet Take 7.5 mg by mouth at bedtime. 06/29/18  Yes [provider]  Multiple Vitamin (MULTIVITAMIN WITH MINERALS) TABS tablet Take 1 tablet by mouth every morning.   Yes [provider]  Omega-3 Fatty Acids (FISH OIL PO) Take 2 capsules by mouth daily with breakfast.    Yes [provider]  pantoprazole (PROTONIX) 40 MG tablet Take 40 mg by mouth daily. 03/09/17  Yes [provider]  Polyvinyl Alcohol-Povidone PF (REFRESH) 1.4-0.6 % SOLN Place 6 drops into both eyes daily.   Yes [provider]  potassium chloride (K-DUR) 10 MEQ tablet Take 10 mEq by mouth daily.  06/08/18  Yes [provider]  promethazine (PHENERGAN) 25 MG tablet Take 12.5 mg by mouth every 8 (eight) hours as needed for nausea or vomiting.   Yes [provider]  ULORIC 40 MG tablet Take 40 mg by mouth daily.  03/16/18  Yes [provider]  loratadine (CLARITIN) 10 MG tablet Take 10 mg by mouth daily as needed for allergies.    [provider]  polyethylene glycol (MIRALAX / GLYCOLAX) packet Take 17 g by mouth daily as needed for moderate constipation.    [provider]  trolamine salicylate (ASPERCREME) 10 % cream Apply 1 application topically as needed for muscle pain (as needed for back pain).    [provider]    Family History History reviewed. No pertinent family history.  Social History Social History   Tobacco Use  . Smoking status: Former Research scientist (life sciences)  . Smokeless tobacco: Never Used  . Tobacco comment: short time x1 - during pregnancy  Substance Use Topics  . Alcohol use: No    Alcohol/week: 0.0 oz  . Drug use: No     Allergies   Azithromycin; Allopurinol; Cephalexin; Codeine; Gabapentin; Niaspan [niacin]; Oxybutynin; Paxil [paroxetine hcl]; Penicillins; Pentazocine; Tizanidine; Tramadol; and Vicodin [hydrocodone-acetaminophen]   Review of Systems Review of Systems  Musculoskeletal:       Left sided cp, left hip, back pain  All other systems reviewed and are negative.    Physical Exam Updated Vital Signs BP (!) 159/89   Pulse 95   Temp 98.1 F (36.7 C)   Resp 18   Ht 5' 3"  (1.6 m)   Wt  68.9 kg (152 lb)   SpO2 97%   BMI 26.93 kg/m   Physical Exam  Constitutional: She is oriented to person, place, and time. She appears well-developed and well-nourished.  HENT:  Head: Normocephalic and atraumatic.  Right Ear: External ear normal.  Left Ear: External ear normal.  Nose: Nose normal.  Mouth/Throat: Oropharynx is clear and moist.  Eyes: Pupils are equal, round, and reactive to light. Conjunctivae and EOM are normal.  Neck: Normal range of motion. Neck supple.  Cardiovascular: Normal rate, regular rhythm, normal heart sounds and intact distal pulses.  Pulmonary/Chest: Effort normal and breath sounds normal.  Abdominal: Soft. Bowel sounds are normal.  Musculoskeletal:       Arms: Neurological: She is alert and oriented to person, place, and time.  Skin: Skin is warm. Capillary refill takes less than 2 seconds.  Psychiatric: She has a normal mood and affect. Her behavior is normal. Judgment and thought content normal.  Nursing note and vitals reviewed.    ED Treatments / Results  Labs (all labs ordered are listed, but only abnormal results are displayed) Labs Reviewed  BASIC METABOLIC PANEL - Abnormal; Notable for the following components:      Result Value   Potassium 3.4 (*)    Glucose, Bld 103 (*)    BUN 24 (*)    Creatinine, Ser 1.48 (*)    GFR calc non Af Amer 32 (*)    GFR calc Af Amer 38 (*)    All other components within normal limits  CBC WITH DIFFERENTIAL/PLATELET - Abnormal; Notable for the following components:   RBC 3.16 (*)    Hemoglobin 10.7 (*)    HCT 29.6 (*)    MCHC 36.1 (*)    All other components within normal limits  URINALYSIS, ROUTINE W REFLEX MICROSCOPIC    EKG None  Radiology Dg Ribs Unilateral W/chest Left  Result Date: 07/08/2018 CLINICAL DATA:  Pain after fall EXAM: LEFT RIBS AND CHEST - 3+ VIEW COMPARISON:  Chest x-ray October 02, 2016 FINDINGS: The heart, hila, mediastinum, lungs, and pleura are normal. No acute rib  fractures noted. No pneumothorax. IMPRESSION: Negative. Electronically Signed   By: Dorise Bullion III M.D   On: 07/08/2018 18:04   Dg Lumbar Spine Complete  Result Date: 07/08/2018 CLINICAL DATA:  Pain after fall EXAM: LUMBAR SPINE - COMPLETE 4+ VIEW COMPARISON:  CT scan June 11, 2018 FINDINGS: Scoliotic curvature of the lumbar spine, apex to the right. No acute fractures are seen. IMPRESSION: Degenerative changes and scoliosis.  No acute fracture identified. Electronically Signed   By: Dorise Bullion III M.D   On: 07/08/2018 18:02   Dg Hip Unilat W Or Wo Pelvis 2-3 Views Left  Result Date: 07/08/2018 CLINICAL DATA:  Pain after fall. EXAM: DG HIP (WITH OR WITHOUT PELVIS) 2-3V LEFT COMPARISON:  None. FINDINGS: There is no evidence of hip fracture or dislocation. There is no evidence of arthropathy or other focal bone abnormality. IMPRESSION: Negative. Electronically Signed   By: Dorise Bullion III M.D   On: 07/08/2018 18:00   Dg Hip Unilat With Pelvis 2-3 Views Left  Result Date: 07/07/2018 CLINICAL DATA:  Fall at home today. Weakness and difficulty walking for the past few months. Bilateral hip pain, worse on the left. EXAM: DG HIP (WITH OR WITHOUT PELVIS) 2-3V RIGHT; DG HIP (WITH OR WITHOUT PELVIS) 2-3V LEFT COMPARISON:  CT abdomen and pelvis 06/11/2018 FINDINGS: Single view of the pelvis including the hips with two views of both hips obtained. Degenerative changes in the lower lumbar spine and in both hips. Diffuse bone demineralization. No evidence of acute fracture or dislocation involving the pelvis, right hip, or left hip. SI joints and symphysis pubis are not displaced. Calcified phleboliths in pelvis. IMPRESSION: Degenerative changes in the hips. No acute displaced fractures identified. Electronically  Signed   By: Lucienne Capers M.D.   On: 07/07/2018 22:20   Dg Hip Unilat  With Pelvis 2-3 Views Right  Result Date: 07/07/2018 CLINICAL DATA:  Fall at home today. Weakness and difficulty  walking for the past few months. Bilateral hip pain, worse on the left. EXAM: DG HIP (WITH OR WITHOUT PELVIS) 2-3V RIGHT; DG HIP (WITH OR WITHOUT PELVIS) 2-3V LEFT COMPARISON:  CT abdomen and pelvis 06/11/2018 FINDINGS: Single view of the pelvis including the hips with two views of both hips obtained. Degenerative changes in the lower lumbar spine and in both hips. Diffuse bone demineralization. No evidence of acute fracture or dislocation involving the pelvis, right hip, or left hip. SI joints and symphysis pubis are not displaced. Calcified phleboliths in pelvis. IMPRESSION: Degenerative changes in the hips. No acute displaced fractures identified. Electronically Signed   By: Lucienne Capers M.D.   On: 07/07/2018 22:20    Procedures Procedures (including critical care time)  Medications Ordered in ED Medications  acetaminophen (TYLENOL) tablet 1,000 mg (1,000 mg Oral Given 07/08/18 1654)     Initial Impression / Assessment and Plan / ED Course  I have reviewed the triage vital signs and the nursing notes.  Pertinent labs & imaging results that were available during my care of the patient were reviewed by me and considered in my medical decision making (see chart for details).    Pt able to ambulate.  Continue using walker.  Return if worse.  F/u with pcp.  Final Clinical Impressions(s) / ED Diagnoses   Final diagnoses:  Fall, initial encounter  Ambulatory dysfunction    ED Discharge Orders    None       Isla Pence, MD 07/08/18 1816

## 2018-07-08 NOTE — ED Triage Notes (Signed)
Pt arrived via EMS pt had a fall today in her bedroom. Pt tripped going from ned to bathroom. Pt denies LOC. Pt is now c/o hip pain and back pain.      EMS v/s 138/62 HR 98 O2 98%  RR 18

## 2018-07-08 NOTE — ED Notes (Addendum)
Pt was changed into blue scrubs for discharge.   ED is under emergency lockdown, which is delaying pt's discharge.

## 2018-07-08 NOTE — ED Notes (Signed)
Called pt son to report pt was in [place]. Pt son was aware. Made son aware that pt would be discharged shortly. Son stated to call back when pt was ready to go, after pt was is able to ambulate and bear weight,

## 2018-07-08 NOTE — ED Notes (Signed)
Per verbal order from EDP, attempted to ambulate with a walker. Pt was able to stand, take a few steps within the room, and sit down on a chair. Pt then was able to stand from chair and walk back to bed. Pt states she is in pain and believes that is what is hindering her progress with ambulating.

## 2018-07-08 NOTE — ED Notes (Signed)
PATIENT UNABLE TO WALK PATIENT UNABLE TO STAND WITHOUT MAX ASSIST

## 2018-07-12 ENCOUNTER — Encounter (HOSPITAL_COMMUNITY)
Admission: RE | Admit: 2018-07-12 | Discharge: 2018-07-12 | Disposition: A | Discharge status: Home / Self Care | Payer: Medicare Other | Source: Ambulatory Visit | Attending: Internal Medicine | Admitting: Internal Medicine

## 2018-07-12 ENCOUNTER — Encounter (HOSPITAL_COMMUNITY): Payer: Self-pay

## 2018-07-12 ENCOUNTER — Other Ambulatory Visit: Payer: Self-pay

## 2018-07-12 ENCOUNTER — Emergency Department (HOSPITAL_COMMUNITY): Payer: Medicare Other

## 2018-07-12 ENCOUNTER — Inpatient Hospital Stay (HOSPITAL_COMMUNITY)
Admission: EM | Admit: 2018-07-12 | Discharge: 2018-07-16 | DRG: 844 | Disposition: A | Discharge status: Skilled Nursing Facility | Payer: Medicare Other | Attending: Internal Medicine | Admitting: Internal Medicine

## 2018-07-12 DIAGNOSIS — C182 Malignant neoplasm of ascending colon: Secondary | ICD-10-CM | POA: Diagnosis not present

## 2018-07-12 DIAGNOSIS — I130 Hypertensive heart and chronic kidney disease with heart failure and stage 1 through stage 4 chronic kidney disease, or unspecified chronic kidney disease: Secondary | ICD-10-CM | POA: Diagnosis not present

## 2018-07-12 DIAGNOSIS — M6281 Muscle weakness (generalized): Secondary | ICD-10-CM | POA: Diagnosis not present

## 2018-07-12 DIAGNOSIS — Z791 Long term (current) use of non-steroidal anti-inflammatories (NSAID): Secondary | ICD-10-CM

## 2018-07-12 DIAGNOSIS — Z880 Allergy status to penicillin: Secondary | ICD-10-CM

## 2018-07-12 DIAGNOSIS — Z7401 Bed confinement status: Secondary | ICD-10-CM | POA: Diagnosis not present

## 2018-07-12 DIAGNOSIS — R627 Adult failure to thrive: Secondary | ICD-10-CM | POA: Diagnosis not present

## 2018-07-12 DIAGNOSIS — N183 Chronic kidney disease, stage 3 (moderate): Secondary | ICD-10-CM | POA: Diagnosis not present

## 2018-07-12 DIAGNOSIS — R488 Other symbolic dysfunctions: Secondary | ICD-10-CM | POA: Diagnosis not present

## 2018-07-12 DIAGNOSIS — Z6826 Body mass index (BMI) 26.0-26.9, adult: Secondary | ICD-10-CM | POA: Diagnosis not present

## 2018-07-12 DIAGNOSIS — C799 Secondary malignant neoplasm of unspecified site: Secondary | ICD-10-CM | POA: Diagnosis present

## 2018-07-12 DIAGNOSIS — K219 Gastro-esophageal reflux disease without esophagitis: Secondary | ICD-10-CM | POA: Diagnosis not present

## 2018-07-12 DIAGNOSIS — M25552 Pain in left hip: Secondary | ICD-10-CM | POA: Diagnosis present

## 2018-07-12 DIAGNOSIS — Z85038 Personal history of other malignant neoplasm of large intestine: Secondary | ICD-10-CM | POA: Insufficient documentation

## 2018-07-12 DIAGNOSIS — C7951 Secondary malignant neoplasm of bone: Secondary | ICD-10-CM | POA: Diagnosis not present

## 2018-07-12 DIAGNOSIS — N2889 Other specified disorders of kidney and ureter: Secondary | ICD-10-CM

## 2018-07-12 DIAGNOSIS — G8929 Other chronic pain: Secondary | ICD-10-CM | POA: Insufficient documentation

## 2018-07-12 DIAGNOSIS — C412 Malignant neoplasm of vertebral column: Secondary | ICD-10-CM | POA: Diagnosis not present

## 2018-07-12 DIAGNOSIS — M25551 Pain in right hip: Secondary | ICD-10-CM | POA: Diagnosis present

## 2018-07-12 DIAGNOSIS — M5440 Lumbago with sciatica, unspecified side: Principal | ICD-10-CM

## 2018-07-12 DIAGNOSIS — R222 Localized swelling, mass and lump, trunk: Secondary | ICD-10-CM | POA: Diagnosis not present

## 2018-07-12 DIAGNOSIS — E785 Hyperlipidemia, unspecified: Secondary | ICD-10-CM | POA: Diagnosis present

## 2018-07-12 DIAGNOSIS — D630 Anemia in neoplastic disease: Secondary | ICD-10-CM | POA: Diagnosis not present

## 2018-07-12 DIAGNOSIS — C7989 Secondary malignant neoplasm of other specified sites: Secondary | ICD-10-CM | POA: Diagnosis not present

## 2018-07-12 DIAGNOSIS — F32A Depression, unspecified: Secondary | ICD-10-CM | POA: Diagnosis present

## 2018-07-12 DIAGNOSIS — Z85820 Personal history of malignant melanoma of skin: Secondary | ICD-10-CM | POA: Diagnosis not present

## 2018-07-12 DIAGNOSIS — W19XXXA Unspecified fall, initial encounter: Secondary | ICD-10-CM

## 2018-07-12 DIAGNOSIS — I5032 Chronic diastolic (congestive) heart failure: Secondary | ICD-10-CM | POA: Diagnosis present

## 2018-07-12 DIAGNOSIS — G2581 Restless legs syndrome: Secondary | ICD-10-CM | POA: Diagnosis not present

## 2018-07-12 DIAGNOSIS — M8458XA Pathological fracture in neoplastic disease, other specified site, initial encounter for fracture: Secondary | ICD-10-CM | POA: Diagnosis not present

## 2018-07-12 DIAGNOSIS — K5900 Constipation, unspecified: Secondary | ICD-10-CM | POA: Diagnosis not present

## 2018-07-12 DIAGNOSIS — H3530 Unspecified macular degeneration: Secondary | ICD-10-CM | POA: Diagnosis present

## 2018-07-12 DIAGNOSIS — M2550 Pain in unspecified joint: Secondary | ICD-10-CM | POA: Diagnosis not present

## 2018-07-12 DIAGNOSIS — Z87891 Personal history of nicotine dependence: Secondary | ICD-10-CM | POA: Diagnosis not present

## 2018-07-12 DIAGNOSIS — Z9049 Acquired absence of other specified parts of digestive tract: Secondary | ICD-10-CM | POA: Diagnosis not present

## 2018-07-12 DIAGNOSIS — R630 Anorexia: Secondary | ICD-10-CM | POA: Diagnosis not present

## 2018-07-12 DIAGNOSIS — Z7982 Long term (current) use of aspirin: Secondary | ICD-10-CM

## 2018-07-12 DIAGNOSIS — M8448XA Pathological fracture, other site, initial encounter for fracture: Secondary | ICD-10-CM | POA: Diagnosis present

## 2018-07-12 DIAGNOSIS — R2689 Other abnormalities of gait and mobility: Secondary | ICD-10-CM | POA: Diagnosis not present

## 2018-07-12 DIAGNOSIS — E876 Hypokalemia: Secondary | ICD-10-CM | POA: Diagnosis present

## 2018-07-12 DIAGNOSIS — F329 Major depressive disorder, single episode, unspecified: Secondary | ICD-10-CM | POA: Diagnosis present

## 2018-07-12 DIAGNOSIS — R918 Other nonspecific abnormal finding of lung field: Secondary | ICD-10-CM | POA: Diagnosis not present

## 2018-07-12 DIAGNOSIS — H409 Unspecified glaucoma: Secondary | ICD-10-CM | POA: Diagnosis not present

## 2018-07-12 DIAGNOSIS — R109 Unspecified abdominal pain: Secondary | ICD-10-CM | POA: Diagnosis not present

## 2018-07-12 DIAGNOSIS — M549 Dorsalgia, unspecified: Secondary | ICD-10-CM

## 2018-07-12 DIAGNOSIS — Z9181 History of falling: Secondary | ICD-10-CM | POA: Diagnosis not present

## 2018-07-12 DIAGNOSIS — M545 Low back pain: Secondary | ICD-10-CM | POA: Diagnosis not present

## 2018-07-12 DIAGNOSIS — J449 Chronic obstructive pulmonary disease, unspecified: Secondary | ICD-10-CM | POA: Diagnosis not present

## 2018-07-12 DIAGNOSIS — Z85828 Personal history of other malignant neoplasm of skin: Secondary | ICD-10-CM

## 2018-07-12 DIAGNOSIS — C7889 Secondary malignant neoplasm of other digestive organs: Secondary | ICD-10-CM | POA: Diagnosis not present

## 2018-07-12 DIAGNOSIS — G893 Neoplasm related pain (acute) (chronic): Secondary | ICD-10-CM | POA: Diagnosis not present

## 2018-07-12 DIAGNOSIS — S3991XA Unspecified injury of abdomen, initial encounter: Secondary | ICD-10-CM | POA: Diagnosis not present

## 2018-07-12 DIAGNOSIS — R296 Repeated falls: Secondary | ICD-10-CM | POA: Diagnosis not present

## 2018-07-12 DIAGNOSIS — M8458XD Pathological fracture in neoplastic disease, other specified site, subsequent encounter for fracture with routine healing: Secondary | ICD-10-CM | POA: Diagnosis not present

## 2018-07-12 DIAGNOSIS — I129 Hypertensive chronic kidney disease with stage 1 through stage 4 chronic kidney disease, or unspecified chronic kidney disease: Secondary | ICD-10-CM | POA: Diagnosis not present

## 2018-07-12 LAB — COMPREHENSIVE METABOLIC PANEL
ALK PHOS: 69 U/L (ref 38–126)
ALT: 10 U/L (ref 0–44)
ANION GAP: 16 — AB (ref 5–15)
AST: 24 U/L (ref 15–41)
Albumin: 3.4 g/dL — ABNORMAL LOW (ref 3.5–5.0)
BILIRUBIN TOTAL: 0.8 mg/dL (ref 0.3–1.2)
BUN: 21 mg/dL (ref 8–23)
CALCIUM: 11.3 mg/dL — AB (ref 8.9–10.3)
CO2: 31 mmol/L (ref 22–32)
Chloride: 95 mmol/L — ABNORMAL LOW (ref 98–111)
Creatinine, Ser: 1.29 mg/dL — ABNORMAL HIGH (ref 0.44–1.00)
GFR calc Af Amer: 44 mL/min — ABNORMAL LOW (ref >60)
GFR calc non Af Amer: 38 mL/min — ABNORMAL LOW (ref >60)
Glucose, Bld: 100 mg/dL — ABNORMAL HIGH (ref 70–99)
Potassium: 3.3 mmol/L — ABNORMAL LOW (ref 3.5–5.1)
Sodium: 142 mmol/L (ref 135–145)
Total Protein: 7 g/dL (ref 6.5–8.1)

## 2018-07-12 LAB — CBC WITH DIFFERENTIAL/PLATELET
BASOS PCT: 0 %
Basophils Absolute: 0 10*3/uL (ref 0.0–0.1)
EOS PCT: 0 %
Eosinophils Absolute: 0 10*3/uL (ref 0.0–0.7)
HCT: 34.3 % — ABNORMAL LOW (ref 36.0–46.0)
HEMOGLOBIN: 12.2 g/dL (ref 12.0–15.0)
Lymphocytes Relative: 10 %
Lymphs Abs: 0.8 10*3/uL (ref 0.7–4.0)
MCH: 33.2 pg (ref 26.0–34.0)
MCHC: 35.6 g/dL (ref 30.0–36.0)
MCV: 93.5 fL (ref 78.0–100.0)
MONO ABS: 0.9 10*3/uL (ref 0.1–1.0)
Monocytes Relative: 11 %
NEUTROS PCT: 79 %
Neutro Abs: 6.4 10*3/uL (ref 1.7–7.7)
Platelets: 368 10*3/uL (ref 150–400)
RBC: 3.67 MIL/uL — ABNORMAL LOW (ref 3.87–5.11)
RDW: 13.5 % (ref 11.5–15.5)
WBC: 8.1 10*3/uL (ref 4.0–10.5)

## 2018-07-12 LAB — MAGNESIUM: MAGNESIUM: 1.5 mg/dL — AB (ref 1.7–2.4)

## 2018-07-12 LAB — URINALYSIS, ROUTINE W REFLEX MICROSCOPIC
BILIRUBIN URINE: NEGATIVE
Glucose, UA: NEGATIVE mg/dL
Hgb urine dipstick: NEGATIVE
KETONES UR: NEGATIVE mg/dL
Leukocytes, UA: NEGATIVE
NITRITE: NEGATIVE
PH: 5 (ref 5.0–8.0)
Protein, ur: NEGATIVE mg/dL
Specific Gravity, Urine: 1.008 (ref 1.005–1.030)

## 2018-07-12 LAB — LIPASE, BLOOD: Lipase: 27 U/L (ref 11–51)

## 2018-07-12 MED ORDER — PROSIGHT PO TABS
1.0000 | ORAL_TABLET | Freq: Every day | ORAL | Status: DC
  Administered 2018-07-13 – 2018-07-14 (×2): 1 via ORAL
  Filled 2018-07-12 (×2): qty 1

## 2018-07-12 MED ORDER — GEMFIBROZIL 600 MG PO TABS
600.0000 mg | ORAL_TABLET | Freq: Every morning | ORAL | Status: DC
  Administered 2018-07-13 – 2018-07-16 (×4): 600 mg via ORAL
  Filled 2018-07-12 (×4): qty 1

## 2018-07-12 MED ORDER — ONDANSETRON HCL 4 MG/2ML IJ SOLN: 4.0000 mg | Freq: Four times a day (QID) | INTRAMUSCULAR | Status: DC | PRN

## 2018-07-12 MED ORDER — PANTOPRAZOLE SODIUM 40 MG PO TBEC
40.0000 mg | DELAYED_RELEASE_TABLET | Freq: Every day | ORAL | Status: DC
  Administered 2018-07-13 – 2018-07-16 (×4): 40 mg via ORAL
  Filled 2018-07-12 (×4): qty 1

## 2018-07-12 MED ORDER — POTASSIUM CHLORIDE CRYS ER 20 MEQ PO TBCR
40.0000 meq | EXTENDED_RELEASE_TABLET | Freq: Once | ORAL | Status: AC
  Administered 2018-07-12: 40 meq via ORAL
  Filled 2018-07-12: qty 2

## 2018-07-12 MED ORDER — DOCUSATE SODIUM 100 MG PO CAPS
100.0000 mg | ORAL_CAPSULE | Freq: Two times a day (BID) | ORAL | Status: DC
  Administered 2018-07-13 – 2018-07-15 (×6): 100 mg via ORAL
  Filled 2018-07-12 (×6): qty 1

## 2018-07-12 MED ORDER — ONDANSETRON HCL 4 MG PO TABS: 4.0000 mg | ORAL_TABLET | Freq: Four times a day (QID) | ORAL | Status: DC | PRN

## 2018-07-12 MED ORDER — POLYETHYLENE GLYCOL 3350 17 G PO PACK
17.0000 g | PACK | Freq: Every day | ORAL | Status: DC | PRN
Start: 2018-07-12 — End: 2018-07-16
  Administered 2018-07-14 – 2018-07-16 (×3): 17 g via ORAL
  Filled 2018-07-12 (×4): qty 1

## 2018-07-12 MED ORDER — ACETAMINOPHEN 650 MG RE SUPP: 650.0000 mg | Freq: Four times a day (QID) | RECTAL | Status: DC | PRN

## 2018-07-12 MED ORDER — OXYCODONE-ACETAMINOPHEN 5-325 MG PO TABS
1.0000 | ORAL_TABLET | ORAL | Status: DC | PRN
  Administered 2018-07-12 – 2018-07-16 (×9): 1 via ORAL
  Filled 2018-07-12 (×9): qty 1

## 2018-07-12 MED ORDER — LORATADINE 10 MG PO TABS: 10.0000 mg | ORAL_TABLET | Freq: Every day | ORAL | Status: DC | PRN

## 2018-07-12 MED ORDER — TECHNETIUM TC 99M MEDRONATE IV KIT
20.0000 | PACK | Freq: Once | INTRAVENOUS | Status: AC | PRN
  Administered 2018-07-12: 20 via INTRAVENOUS

## 2018-07-12 MED ORDER — ONDANSETRON HCL 4 MG/2ML IJ SOLN
4.0000 mg | Freq: Once | INTRAMUSCULAR | Status: AC
  Administered 2018-07-12: 4 mg via INTRAVENOUS
  Filled 2018-07-12: qty 2

## 2018-07-12 MED ORDER — IOPAMIDOL (ISOVUE-300) INJECTION 61%
INTRAVENOUS | Status: AC
  Filled 2018-07-12: qty 100

## 2018-07-12 MED ORDER — PROMETHAZINE HCL 25 MG PO TABS
12.5000 mg | ORAL_TABLET | Freq: Three times a day (TID) | ORAL | Status: DC | PRN
Start: 2018-07-12 — End: 2018-07-16

## 2018-07-12 MED ORDER — IOPAMIDOL (ISOVUE-300) INJECTION 61%
80.0000 mL | Freq: Once | INTRAVENOUS | Status: AC | PRN
  Administered 2018-07-12: 80 mL via INTRAVENOUS

## 2018-07-12 MED ORDER — CALCIUM CARBONATE-VITAMIN D 500-200 MG-UNIT PO TABS
1.0000 | ORAL_TABLET | Freq: Every morning | ORAL | Status: DC
Start: 2018-07-13 — End: 2018-07-14
  Administered 2018-07-13: 1 via ORAL
  Filled 2018-07-12 (×2): qty 1

## 2018-07-12 MED ORDER — MELOXICAM 15 MG PO TABS
15.0000 mg | ORAL_TABLET | Freq: Every day | ORAL | Status: DC
  Administered 2018-07-13 – 2018-07-16 (×4): 15 mg via ORAL
  Filled 2018-07-12 (×4): qty 1

## 2018-07-12 MED ORDER — FUROSEMIDE 20 MG PO TABS
20.0000 mg | ORAL_TABLET | Freq: Every day | ORAL | Status: DC
  Administered 2018-07-13 – 2018-07-16 (×4): 20 mg via ORAL
  Filled 2018-07-12 (×4): qty 1

## 2018-07-12 MED ORDER — ASPIRIN EC 81 MG PO TBEC
81.0000 mg | DELAYED_RELEASE_TABLET | Freq: Every morning | ORAL | Status: DC
  Administered 2018-07-13 – 2018-07-16 (×4): 81 mg via ORAL
  Filled 2018-07-12 (×4): qty 1

## 2018-07-12 MED ORDER — FEBUXOSTAT 40 MG PO TABS
40.0000 mg | ORAL_TABLET | Freq: Every day | ORAL | Status: DC
  Administered 2018-07-13 – 2018-07-16 (×4): 40 mg via ORAL
  Filled 2018-07-12 (×4): qty 1

## 2018-07-12 MED ORDER — MORPHINE SULFATE (PF) 4 MG/ML IV SOLN
4.0000 mg | Freq: Once | INTRAVENOUS | Status: AC
  Administered 2018-07-12: 4 mg via INTRAVENOUS
  Filled 2018-07-12: qty 1

## 2018-07-12 MED ORDER — POTASSIUM CHLORIDE CRYS ER 10 MEQ PO TBCR
10.0000 meq | EXTENDED_RELEASE_TABLET | Freq: Every day | ORAL | Status: DC
  Administered 2018-07-13 – 2018-07-16 (×4): 10 meq via ORAL
  Filled 2018-07-12 (×4): qty 1

## 2018-07-12 MED ORDER — CYCLOBENZAPRINE HCL 5 MG PO TABS
5.0000 mg | ORAL_TABLET | Freq: Three times a day (TID) | ORAL | Status: DC
  Administered 2018-07-13 – 2018-07-16 (×11): 5 mg via ORAL
  Filled 2018-07-12 (×11): qty 1

## 2018-07-12 MED ORDER — GLYCERIN-HYPROMELLOSE-PEG 400 0.2-0.2-1 % OP SOLN
6.0000 [drp] | Freq: Every day | OPHTHALMIC | Status: DC
Start: 2018-07-12 — End: 2018-07-16
  Administered 2018-07-13 – 2018-07-16 (×4): 6 [drp] via OPHTHALMIC
  Filled 2018-07-12: qty 15

## 2018-07-12 MED ORDER — ACETAMINOPHEN 325 MG PO TABS: 650.0000 mg | ORAL_TABLET | Freq: Four times a day (QID) | ORAL | Status: DC | PRN

## 2018-07-12 MED ORDER — MIRTAZAPINE 15 MG PO TABS
7.5000 mg | ORAL_TABLET | Freq: Every day | ORAL | Status: DC
  Administered 2018-07-13 – 2018-07-15 (×4): 7.5 mg via ORAL
  Filled 2018-07-12 (×6): qty 1

## 2018-07-12 MED ORDER — ENOXAPARIN SODIUM 40 MG/0.4ML ~~LOC~~ SOLN
40.0000 mg | Freq: Every day | SUBCUTANEOUS | Status: DC
  Administered 2018-07-13: 40 mg via SUBCUTANEOUS
  Filled 2018-07-12: qty 0.4

## 2018-07-12 MED ORDER — VITAMIN B-12 1000 MCG PO TABS
1000.0000 ug | ORAL_TABLET | Freq: Every morning | ORAL | Status: DC
  Administered 2018-07-13 – 2018-07-16 (×4): 1000 ug via ORAL
  Filled 2018-07-12 (×4): qty 1

## 2018-07-12 MED ORDER — POLYVINYL ALCOHOL 1.4 % OP SOLN
6.0000 [drp] | Freq: Every day | OPHTHALMIC | Status: DC
  Filled 2018-07-12: qty 15

## 2018-07-12 NOTE — ED Notes (Signed)
ED TO INPATIENT HANDOFF REPORT  Name/Age/Gender Erica Escobar 82 y.o. female  Code Status    Code Status Orders  (From admission, onward)        Start     Ordered   07/12/18 2004  Full code  Continuous     07/12/18 2005    Code Status History    Date Active Date Inactive Code Status Order ID Comments User Context   02/15/2015 1239 02/19/2015 1334 Full Code [id number]  Michael Boston, MD Inpatient      Home/SNF/Other Home  Chief Complaint Fall (last week), back pain  Level of Care/Admitting Diagnosis ED Disposition    ED Disposition Condition [place]: [facility] [[id number]]  Level of Care: Med-Surg [16]  Diagnosis: Metastatic cancer [facility]) [[id number]]  Admitting Physician: Rosemarie Beath [facility] [[id number]]  Attending Physician: [facility], [facility] [[id number]]  Estimated length of stay: past midnight tomorrow  Certification:: I certify this patient will need inpatient services for at least 2 midnights  PT Class (Do Not Modify): Inpatient [101]  PT Acc Code (Do Not Modify): Private [1]       Medical History Past Medical History:  Diagnosis Date  . Abnormal tympanic membrane    right ear and mastoid problems  . Arthritis    oseoarthritis  . Cancer of ascending colon ([place]) 02/15/2015  . Constipation, chronic   . COPD (chronic obstructive pulmonary disease) (HCC)    mild COPD  . Depression   . Diverticulosis   . GERD (gastroesophageal reflux disease)   . Glaucoma    bilateral-sugery with laser, using eye drops  . Hemorrhoids    Internal  . History of hiatal hernia    dx. '87 Schatzki's ring  . Hyperlipidemia    good control  . Hypertension   . Insomnia    hx. of  . Keratosis, actinic   . Leg cramps   . Macular degeneration   . Neuromuscular disorder (HCC)    neuropathy legs,   . On aspirin at home    chronic use  . PONV (postoperative nausea and vomiting)   . Restless leg syndrome   . Schatzki's ring 1987  .  Skin cancer    multiple, "tx. area right anterior wrist"  . Urinary incontinence    occ, stress    Allergies Allergies  Allergen Reactions  . Azithromycin Itching  . Allopurinol   . Cephalexin   . Codeine Itching  . Gabapentin Swelling    Throat and leg swelling  . Niaspan [Niacin]   . Oxybutynin Other (See Comments)    "made me go crazy"-- had crazy dreams.   . Paxil [Paroxetine Hcl]     Unknown reaction  . Penicillins Itching    Has patient had a PCN reaction causing immediate rash, facial/tongue/throat swelling, SOB or lightheadedness with hypotension: No Has patient had a PCN reaction causing severe rash involving mucus membranes or skin necrosis: Yes Has patient had a PCN reaction that required hospitalization: No Has patient had a PCN reaction occurring within the last 10 years: No If all of the above answers are "NO", then may proceed with Cephalosporin use.   Marland Kitchen Pentazocine Itching and Nausea Only    Headache.   . Tizanidine Other (See Comments)    "made me crazy"  . Tramadol     Head feels "swimmy" and weakness  . Vicodin [Hydrocodone-Acetaminophen] Other (See Comments)    "made me go crazy"    IV  Location/Drains/Wounds Patient Lines/Drains/Airways Status   Active Line/Drains/Airways    Name:   Placement date:   Placement time:   Site:   Days:   Peripheral IV 07/12/18 Right Antecubital   07/12/18    1532    Antecubital   less than 1          Labs/Imaging Results for orders placed or performed during the hospital encounter of 07/12/18 (from the past 48 hour(s))  Urinalysis, Routine w reflex microscopic     Status: None   Collection Time: 07/12/18  3:20 PM  Result Value Ref Range   Color, Urine YELLOW YELLOW   APPearance CLEAR CLEAR   Specific Gravity, Urine 1.008 1.005 - 1.030   pH 5.0 5.0 - 8.0   Glucose, UA NEGATIVE NEGATIVE mg/dL   Hgb urine dipstick NEGATIVE NEGATIVE   Bilirubin Urine NEGATIVE NEGATIVE   Ketones, ur NEGATIVE NEGATIVE mg/dL    Protein, ur NEGATIVE NEGATIVE mg/dL   Nitrite NEGATIVE NEGATIVE   Leukocytes, UA NEGATIVE NEGATIVE    Comment: Performed at [facility], [place] [address], [place], [place] [address]  CBC with Differential     Status: Abnormal   Collection Time: 07/12/18  3:30 PM  Result Value Ref Range   WBC 8.1 4.0 - 10.5 K/uL   RBC 3.67 (L) 3.87 - 5.11 MIL/uL   Hemoglobin 12.2 12.0 - 15.0 g/dL   HCT 34.3 (L) 36.0 - 46.0 %   MCV 93.5 78.0 - 100.0 fL   MCH 33.2 26.0 - 34.0 pg   MCHC 35.6 30.0 - 36.0 g/dL   RDW 13.5 11.5 - 15.5 %   Platelets 368 150 - 400 K/uL    Comment: SPECIMEN CHECKED FOR CLOTS REPEATED TO VERIFY PLATELET COUNT CONFIRMED BY SMEAR    Neutrophils Relative % 79 %   Lymphocytes Relative 10 %   Monocytes Relative 11 %   Eosinophils Relative 0 %   Basophils Relative 0 %   Neutro Abs 6.4 1.7 - 7.7 K/uL   Lymphs Abs 0.8 0.7 - 4.0 K/uL   Monocytes Absolute 0.9 0.1 - 1.0 K/uL   Eosinophils Absolute 0.0 0.0 - 0.7 K/uL   Basophils Absolute 0.0 0.0 - 0.1 K/uL    Comment: Performed at [facility], [place] [address] [place], [place] [address]  Comprehensive metabolic panel     Status: Abnormal   Collection Time: 07/12/18  3:30 PM  Result Value Ref Range   Sodium 142 135 - 145 mmol/L   Potassium 3.3 (L) 3.5 - 5.1 mmol/L   Chloride 95 (L) 98 - 111 mmol/L   CO2 31 22 - 32 mmol/L   Glucose, Bld 100 (H) 70 - 99 mg/dL   BUN 21 8 - 23 mg/dL   Creatinine, Ser 1.29 (H) 0.44 - 1.00 mg/dL   Calcium 11.3 (H) 8.9 - 10.3 mg/dL   Total Protein 7.0 6.5 - 8.1 g/dL   Albumin 3.4 (L) 3.5 - 5.0 g/dL   AST 24 15 - 41 U/L   ALT 10 0 - 44 U/L   Alkaline Phosphatase 69 38 - 126 U/L   Total Bilirubin 0.8 0.3 - 1.2 mg/dL   GFR calc non Af Amer 38 (L) >60 mL/min   GFR calc Af Amer 44 (L) >60 mL/min    Comment: (NOTE) The eGFR has been calculated using the CKD EPI equation. This calculation has not been validated in all clinical situations. eGFR's persistently <60  mL/min signify possible Chronic Kidney Disease.  Anion gap 16 (H) 5 - 15    Comment: Performed at [facility], [place] [address], [place], [place] [address]  Lipase, blood     Status: None   Collection Time: 07/12/18  3:30 PM  Result Value Ref Range   Lipase 27 11 - 51 U/L    Comment: Performed at [facility], [place] [address], [place], [place] [address]  Magnesium     Status: Abnormal   Collection Time: 07/12/18  7:57 PM  Result Value Ref Range   Magnesium 1.5 (L) 1.7 - 2.4 mg/dL    Comment: Performed at [facility], [place] [address], [place], [place] [address]   Dg Chest 2 View  Result Date: 07/12/2018 CLINICAL DATA:  Chest mass EXAM: CHEST - 2 VIEW COMPARISON:  07/08/2018, bone scan 07/12/2018 FINDINGS: Low lung volume. No pleural effusion. Borderline to mild cardiomegaly. No pneumothorax. Scarring within the anterior lung base. IMPRESSION: No active cardiopulmonary disease.  Borderline to mild cardiomegaly. Electronically Signed   By: Donavan Foil M.D.   On: 07/12/2018 19:41   Nm Bone Scan Whole Body  Result Date: 07/12/2018 CLINICAL DATA:  Chronic BILATERAL low back pain with sciatica, history colon cancer EXAM: NUCLEAR MEDICINE WHOLE BODY BONE SCAN TECHNIQUE: Whole body anterior and posterior images were obtained approximately 3 hours after intravenous injection of radiopharmaceutical. RADIOPHARMACEUTICALS:  20.2 mCi Technetium-55mMDP IV COMPARISON:  None Radiographic correlation: CT abdomen pelvis 07/12/2018 FINDINGS: Motion artifacts at head/shoulders. Uptake at the shoulders, knees and feet typically degenerative. Small focus of abnormal increased tracer localization is seen at the posterior RIGHT eleventh rib with adjacent areas of slightly decreased tracer localization at the medial posterior RIGHT tenth and eleventh ribs. These correspond to a pathologic fracture of the RIGHT eleventh rib and to a large posterior chest wall  destructive soft tissue mass seen on CT which extends into the RIGHT paraspinal muscles. No additional sites of abnormal osseous tracer accumulation are identified. Expected urinary tract and soft tissue distribution of tracer. IMPRESSION: Foci of decreased uptake in the posterior medial RIGHT tenth and eleventh ribs with additional uptake at a pathologic fracture of the posterior RIGHT eleventh rib, all at a site of a destructive chest wall soft tissue mass identified on CT. No additional scintigraphic abnormalities identified. Electronically Signed   By: MLavonia DanaM.D.   On: 07/12/2018 17:49   Ct Abdomen Pelvis W Contrast  Result Date: 07/12/2018 CLINICAL DATA:  Back pain and BILATERAL hip pain after 3 falls in the past week, worsening abdominal and back pain, blunt abdominal trauma, stable, history COPD, colon cancer, hypertension, former smoker EXAM: CT ABDOMEN AND PELVIS WITH CONTRAST TECHNIQUE: Multidetector CT imaging of the abdomen and pelvis was performed using the standard protocol following bolus administration of intravenous contrast. Sagittal and coronal MPR images reconstructed from axial data set. Additional reconstructed images of the CONTRAST:  827mISOVUE-300 IOPAMIDOL (ISOVUE-300) INJECTION 61% IV. No oral contrast. COMPARISON:  06/11/2018 FINDINGS: Lower chest: Subsegmental atelectasis BILATERAL lower lobes Hepatobiliary: Gallbladder surgically absent.  Liver unremarkable. Pancreas: Atrophic without mass Spleen: Calcified granuloma.  Otherwise normal appearance. Adrenals/Urinary Tract: Adrenal glands unremarkable. Mass in LEFT perinephric space lateral to mid LEFT kidney measuring 3.0 x 2.3 x 3.0 cm in size, increased since 06/11/2018. This abuts the cortex of the LEFT kidney but appears to arise within the adjacent perinephric space rather than renal parenchyma. Kidneys, ureters, and bladder otherwise normal appearance. Stomach/Bowel: Prior RIGHT hemicolectomy. Mild sigmoid diverticulosis.  Stomach and bowel loops otherwise normal appearance. Vascular/Lymphatic: Atherosclerotic calcifications aorta without aneurysm. No adenopathy. Reproductive: Uterus surgically absent. Nonvisualization of ovaries. Other: No free air or free fluid. No acute inflammatory process. No hernia. Musculoskeletal: Subcutaneous nodule anterior LEFT mid abdomen, 2.2 x 1.4 x 1.7 cm increased since prior exam question subcutaneous metastasis. Large to soft tissue mass identified at the RIGHT paraspinal region extending from T8-L2 measuring 10.5 x 6.1 x 8.3 cm in size consistent with a large chest wall metastasis. Associated destruction of the posterior RIGHT tenth and eleventh ribs with a pathologic fracture of the posterior eleventh rib. Mass involves the RIGHT paraspinal muscles and traverses the midline into the medial LEFT paraspinal muscles. Pathologic fractures are also seen at the spinous processes of T8 and T9 with additional bone destruction at the posterior elements of RIGHT T9, T10 and T11. Mass extends into the RIGHT T10-T11 neural foramen. IMPRESSION: Large destructive chest wall mass posterior RIGHT paraspinal at T10-L2 measuring 10.5 x 6.1 x 8.3 cm in size, with associated bone destruction involving the RIGHT posterior elements of T9, T10 and T11, spinous processes of T8 and T9, and the posterior RIGHT tenth and eleventh ribs as above. Probable subcutaneous metastasis anterior LEFT abdominal wall 2.2 cm greatest size. Enlarging mass in the LEFT perinephric space 3.0 cm in size, question metastasis versus renal tumor. Electronically Signed   By: Lavonia Dana M.D.   On: 07/12/2018 18:17   Ct L-spine No Charge  Result Date: 07/12/2018 CLINICAL DATA:  Back pain and bilateral hip pain. Multiple recent falls. EXAM: CT LUMBAR SPINE WITHOUT CONTRAST TECHNIQUE: Multidetector CT imaging of the lumbar spine was performed without intravenous contrast administration. Multiplanar CT image reconstructions were also generated.  COMPARISON:  None. FINDINGS: Segmentation: 5 lumbar type vertebrae. Alignment: Left convex scoliosis apexes at L4. Vertebrae: There is a soft tissue mass centered in the right paraspinous musculature at the T10-T11 level, extending inferiorly as far as the L1 level. The mass is predominantly hyperdense and has increased in size over time. There is associated destruction of the right transverse process of T11 and T12. Paraspinal and other soft tissues: Please refer to dedicated CT abdomen pelvis report. Disc levels: T11-T12: Suspected encroachment on the right neural foramen by the paraspinous mass. No other stenosis. T12-L1: Disc vacuum phenomenon without stenosis. L1-L2: Disc vacuum phenomenon with moderate left foraminal stenosis. L2-L3: Disc space narrowing with severe left foraminal stenosis. L3-L4: Disc space narrowing with prominent endplate osteophytes. Severe bilateral neural foraminal stenosis. Mild spinal canal stenosis. L4-L5: Disc space narrowing with endplate osteophytes. Severe right and moderate left foraminal stenosis. L5-S1: Disc space narrowing with moderate facet hypertrophy. Severe right and mild left foraminal stenosis. IMPRESSION: 1. Paraspinous soft tissue mass on the right at the lower thoracic spine, likely metastatic disease. This causes destruction of the right T11 and 12 transverse processes and may encroach on the right T11 neural foramen. 2. Please refer to dedicated report for concomitant CT abdomen pelvis for soft tissue findings. 3. Multilevel severe neural foraminal stenosis. Electronically Signed   By: Ulyses Jarred M.D.   On: 07/12/2018 18:10   Dg Hips Bilat W Or Wo Pelvis 2 Views  Result Date: 07/12/2018 CLINICAL DATA:  Bilateral hip pain after multiple falls. EXAM: DG HIP (WITH OR WITHOUT PELVIS) 2V BILAT COMPARISON:  None. FINDINGS: There is no evidence of hip fracture or dislocation. There is no evidence of arthropathy or other focal bone abnormality. IMPRESSION: Normal  bilateral hips. Electronically Signed  By: Marijo Conception, M.D.   On: 07/12/2018 16:28    Pending Labs Unresulted Labs (From admission, onward)   Start     Ordered   07/13/18 0500  Comprehensive metabolic panel  Tomorrow morning,   R     07/12/18 2005   07/13/18 0500  CBC  Tomorrow morning,   R     07/12/18 2005   07/12/18 1957  CEA  Once,   R     07/12/18 1957      Vitals/Pain Today's Vitals   07/12/18 1840 07/12/18 1900 07/12/18 2054 07/12/18 2207  BP: (!) 169/85 (!) 181/96 (!) 141/87   Pulse: 93 94 100   Resp: 14 16 16    Temp:      TempSrc:      SpO2: 90% 97% 97%   Weight:      Height:      PainSc:    8     Isolation Precautions No active isolations  Medications Medications  iopamidol (ISOVUE-300) 61 % injection (has no administration in time range)  aspirin EC tablet 81 mg (has no administration in time range)  beta carotene w/minerals (OCUVITE) tablet 1 tablet (has no administration in time range)  calcium-vitamin D (OSCAL) 250-125 MG-UNIT per tablet 1 tablet (has no administration in time range)  Vitamin B 12 LOZG 1,000 mg (has no administration in time range)  cyclobenzaprine (FLEXERIL) tablet 5 mg (has no administration in time range)  furosemide (LASIX) tablet 20 mg (has no administration in time range)  gemfibrozil (LOPID) tablet 600 mg (has no administration in time range)  loratadine (CLARITIN) tablet 10 mg (has no administration in time range)  meloxicam (MOBIC) tablet 15 mg (has no administration in time range)  mirtazapine (REMERON) tablet 7.5 mg (has no administration in time range)  oxyCODONE-acetaminophen (PERCOCET/ROXICET) 5-325 MG per tablet 1 tablet (has no administration in time range)  pantoprazole (PROTONIX) EC tablet 40 mg (has no administration in time range)  polyethylene glycol (MIRALAX / GLYCOLAX) packet 17 g (has no administration in time range)  Polyvinyl Alcohol-Povidone PF 1.4-0.6 % SOLN 6 drop (has no administration in time range)   potassium chloride (K-DUR,KLOR-CON) CR tablet 10 mEq (has no administration in time range)  promethazine (PHENERGAN) tablet 12.5 mg (has no administration in time range)  febuxostat (ULORIC) tablet 40 mg (has no administration in time range)  enoxaparin (LOVENOX) injection 40 mg (has no administration in time range)  acetaminophen (TYLENOL) tablet 650 mg (has no administration in time range)    Or  acetaminophen (TYLENOL) suppository 650 mg (has no administration in time range)  docusate sodium (COLACE) capsule 100 mg (has no administration in time range)  ondansetron (ZOFRAN) tablet 4 mg (has no administration in time range)    Or  ondansetron (ZOFRAN) injection 4 mg (has no administration in time range)  morphine 4 MG/ML injection 4 mg (4 mg Intravenous Given 07/12/18 1534)  ondansetron (ZOFRAN) injection 4 mg (4 mg Intravenous Given 07/12/18 1533)  iopamidol (ISOVUE-300) 61 % injection 80 mL (80 mLs Intravenous Contrast Given 07/12/18 1726)  potassium chloride SA (K-DUR,KLOR-CON) CR tablet 40 mEq (40 mEq Oral Given 07/12/18 2025)    Mobility non-ambulatory

## 2018-07-12 NOTE — ED Notes (Signed)
Urine sample at bedside if needed

## 2018-07-12 NOTE — ED Notes (Addendum)
Receiving RN was unable to take report at this time.

## 2018-07-12 NOTE — ED Triage Notes (Signed)
Pt c/o of back pain and bilateral hip pain after 3 falls in the past week. Seen x3 days ago for same.

## 2018-07-12 NOTE — ED Provider Notes (Signed)
[facility] DEPT Provider Note   CSN: [id number] Arrival date & time: 07/12/18  1332     History   Chief Complaint Chief Complaint  Patient presents with  . Back Pain    HPI Erica Escobar is a 82 y.o. female.  Erica Escobar is a 82 y.o. Female with a history of hypertension, hyperlipidemia, COPD, GERD, colon cancer and neuropathy, who presents to the emergency department for evaluation of recurrent falls, she was last seen on 7/26, after a fall and then reports she fell again hitting her low back and hips as she fell into a piece of furniture on 7/27, she additionally did not return to the emergency department because she had been evaluated twice in the 2 days prior for falls, reports she is continued to have severe pain in her hips, left < right, as well as the middle of her low back.  Patient reports she has been seeing Dr.Goiffre at [facility] for low back pain, with some improvement but after her falls pain seems to have gotten significantly worse. Patient also reports some abdominal pain and is unsure if she hit this.  Patient does not think she hit her head, did not have loss of consciousness, and she denies pain in her neck from this fall.  She reports it feels like her back and hips are on fire and is radiating down her legs.  She denies chest pain or shortness of breath.  Reports diffuse abdominal pain worse across the lower abdomen, no nausea or vomiting.  Reports she has tripped on things are lost her balance, occasionally feels dizzy before falling.  Patient reports she uses a walker when ambulating at all times, currently lives by herself and has no help at home aside from her sister intermittently coming to visit and check on her.    Additional history obtained from electronic medical record, reviewing patient's 2 prior visits for falls, as well as from the patient's sister.     Past Medical History:  Diagnosis Date  . Abnormal tympanic  membrane    right ear and mastoid problems  . Arthritis    oseoarthritis  . Cancer of ascending colon ([place]) 02/15/2015  . Constipation, chronic   . COPD (chronic obstructive pulmonary disease) (HCC)    mild COPD  . Depression   . Diverticulosis   . GERD (gastroesophageal reflux disease)   . Glaucoma    bilateral-sugery with laser, using eye drops  . Hemorrhoids    Internal  . History of hiatal hernia    dx. '87 Schatzki's ring  . Hyperlipidemia    good control  . Hypertension   . Insomnia    hx. of  . Keratosis, actinic   . Leg cramps   . Macular degeneration   . Neuromuscular disorder (HCC)    neuropathy legs,   . On aspirin at home    chronic use  . PONV (postoperative nausea and vomiting)   . Restless leg syndrome   . Schatzki's ring 1987  . Skin cancer    multiple, "tx. area right anterior wrist"  . Urinary incontinence    occ, stress    Patient Active Problem List   Diagnosis Date Noted  . Metastatic cancer ([place]) 07/12/2018  . Chronic back pain 07/12/2018  . Pathologic rib fracture, initial encounter 07/12/2018  . Chest wall mass 07/12/2018  . Cancer of ascending colon s/p robotic colectomy 02/15/2015 02/15/2015  . Restless leg syndrome   . Depression   .  Insomnia   . COPD (chronic obstructive pulmonary disease) ([place])   . GERD (gastroesophageal reflux disease)     Past Surgical History:  Procedure Laterality Date  . ABDOMINAL HYSTERECTOMY    . BLADDER REPAIR    . CARDIAC CATHETERIZATION  2007, 2009  . CATARACT EXTRACTION Bilateral   . CHOLECYSTECTOMY    . COLECTOMY    . FINGER NAIL SURGERY    . FOOT SURGERY Right   . HEMORROIDECTOMY    . INNER EAR SURGERY Right    3-4 times  . INTRAOPERATIVE ARTERIOGRAM     right arm  . KNEE SURGERY Right   . LEG SURGERY Bilateral    laser ablation  . MASTOID DEBRIDEMENT     x2  . SKIN CANCER EXCISION     right/left thumb nail, left face, right ear/neck, right leg  . TOE FUSION    . TONSILLECTOMY       OB  History   None      Home Medications    Prior to Admission medications   Medication Sig Start Date End Date Taking? Authorizing Provider  acetaminophen (TYLENOL) 500 MG tablet Take 2 tablets (1,000 mg total) by mouth every 6 (six) hours as needed. 06/11/18  Yes Charlesetta Shanks, MD  aspirin EC 81 MG tablet Take 81 mg by mouth every morning.   Yes [provider]  beta carotene w/minerals (OCUVITE) tablet Take 1 tablet by mouth daily.    Yes [provider]  calcium-vitamin D (OSCAL) 250-125 MG-UNIT per tablet Take 1 tablet by mouth every morning.   Yes [provider]  Cyanocobalamin (VITAMIN B 12 PO) Take 1,000 mg by mouth every morning.    Yes [provider]  cyclobenzaprine (FLEXERIL) 5 MG tablet Take 5 mg by mouth 3 (three) times daily. 06/29/18  Yes [provider]  furosemide (LASIX) 40 MG tablet Take 20 mg by mouth daily.    Yes [provider]  gemfibrozil (LOPID) 600 MG tablet Take 600 mg by mouth every morning.   Yes [provider]  loratadine (CLARITIN) 10 MG tablet Take 10 mg by mouth daily as needed for allergies.   Yes [provider]  meloxicam (MOBIC) 15 MG tablet Take 15 mg by mouth daily.  05/30/18  Yes [provider]  mirtazapine (REMERON) 15 MG tablet Take 7.5 mg by mouth at bedtime. 06/29/18  Yes [provider]  Multiple Vitamin (MULTIVITAMIN WITH MINERALS) TABS tablet Take 1 tablet by mouth every morning.   Yes [provider]  Omega-3 Fatty Acids (FISH OIL PO) Take 2 capsules by mouth daily with breakfast.    Yes [provider]  oxyCODONE-acetaminophen (PERCOCET/ROXICET) 5-325 MG tablet Take 1 tablet by mouth every 4 (four) hours as needed for severe pain. 07/08/18  Yes Isla Pence, MD  pantoprazole (PROTONIX) 40 MG tablet Take 40 mg by mouth daily. 03/09/17  Yes [provider]  polyethylene glycol (MIRALAX / GLYCOLAX) packet Take 17 g by mouth daily  as needed for moderate constipation.   Yes [provider]  Polyvinyl Alcohol-Povidone PF (REFRESH) 1.4-0.6 % SOLN Place 6 drops into both eyes daily.   Yes [provider]  potassium chloride (K-DUR) 10 MEQ tablet Take 10 mEq by mouth daily.  06/08/18  Yes [provider]  promethazine (PHENERGAN) 25 MG tablet Take 12.5 mg by mouth every 8 (eight) hours as needed for nausea or vomiting.   Yes [provider]  trolamine salicylate (ASPERCREME) 10 %  cream Apply 1 application topically as needed for muscle pain (as needed for back pain).   Yes [provider]  ULORIC 40 MG tablet Take 40 mg by mouth daily.  03/16/18  Yes [provider]    Family History No family history on file.  Social History Social History   Tobacco Use  . Smoking status: Former Research scientist (life sciences)  . Smokeless tobacco: Never Used  . Tobacco comment: short time x1 - during pregnancy  Substance Use Topics  . Alcohol use: No    Alcohol/week: 0.0 oz  . Drug use: No     Allergies   Azithromycin; Allopurinol; Cephalexin; Codeine; Gabapentin; Niaspan [niacin]; Oxybutynin; Paxil [paroxetine hcl]; Penicillins; Pentazocine; Tizanidine; Tramadol; and Vicodin [hydrocodone-acetaminophen]   Review of Systems Review of Systems  Constitutional: Negative for chills and fever.  HENT: Negative for congestion, rhinorrhea and sore throat.   Eyes: Negative for visual disturbance.  Respiratory: Negative for cough and shortness of breath.   Cardiovascular: Negative for chest pain.  Gastrointestinal: Positive for abdominal pain. Negative for diarrhea, nausea and vomiting.  Genitourinary: Negative for dysuria and flank pain.  Musculoskeletal: Positive for arthralgias and back pain. Negative for joint swelling and neck pain.  Skin: Negative for color change, rash and wound.  Neurological: Positive for dizziness and weakness. Negative for numbness and headaches.     Physical Exam Updated  Vital Signs BP (!) 185/92 (BP Location: Right Arm)   Pulse (!) 110   Temp 97.6 F (36.4 C) (Oral)   Resp 16   Ht 5' 3"  (1.6 m)   Wt 68.9 kg (152 lb)   SpO2 95%   BMI 26.93 kg/m   Physical Exam  Constitutional: She is oriented to person, place, and time. She appears well-developed and well-nourished. No distress.  HENT:  Head: Normocephalic and atraumatic.  Scalp without signs of trauma, no palpable hematoma, no step-off, negative battle sign, no evidence of hemotympanum or CSF otorrhea   Eyes: Right eye exhibits no discharge. Left eye exhibits no discharge.  Neck: Normal range of motion. Neck supple.  C-spine nontender to palpation at midline or paraspinally, normal range of motion in all directions.  No seatbelt sign, no palpable deformity or crepitus  Cardiovascular: Normal rate, regular rhythm, normal heart sounds and intact distal pulses.  Pulmonary/Chest: Effort normal and breath sounds normal. No stridor. No respiratory distress. She has no wheezes. She has no rales. She exhibits no tenderness.  Respirations equal and unlabored, patient able to speak in full sentences, lungs clear to auscultation bilaterally. Chest NTTP, no bruising or palpable defmority  Abdominal: Soft. Bowel sounds are normal. She exhibits no distension and no mass. There is tenderness. There is guarding.  Abdomen soft, nondistended, no ecchymosis noted, bowel sounds present throughout, there is diffuse tenderness, most focal in the lower quadrants with guarding present  Musculoskeletal: She exhibits no edema or deformity.  Tenderness to palpation over the lower thoracic and upper lumbar spine at midline without palpable deformity.  Tenderness over both hips without obvious palpable deformity, tenderness more severe over the left hip.  Both legs are not shortened, or held in internal or external rotation.  Patient is able to move knees and ankles without difficulty, no appreciable swelling or deformity.    Neurological: She is alert and oriented to person, place, and time. Coordination normal.  Speech is clear, able to follow commands CN III-XII intact Normal strength in upper and lower extremities bilaterally including dorsiflexion and plantar flexion, strong and equal grip  strength Sensation normal to light and sharp touch Moves extremities without ataxia, coordination intact  Skin: Skin is warm and dry. She is not diaphoretic.  Psychiatric: She has a normal mood and affect. Her behavior is normal.  Nursing note and vitals reviewed.    ED Treatments / Results  Labs (all labs ordered are listed, but only abnormal results are displayed) Labs Reviewed  CBC WITH DIFFERENTIAL/PLATELET - Abnormal; Notable for the following components:      Result Value   RBC 3.67 (*)    HCT 34.3 (*)    All other components within normal limits  COMPREHENSIVE METABOLIC PANEL - Abnormal; Notable for the following components:   Potassium 3.3 (*)    Chloride 95 (*)    Glucose, Bld 100 (*)    Creatinine, Ser 1.29 (*)    Calcium 11.3 (*)    Albumin 3.4 (*)    GFR calc non Af Amer 38 (*)    GFR calc Af Amer 44 (*)    Anion gap 16 (*)    All other components within normal limits  MAGNESIUM - Abnormal; Notable for the following components:   Magnesium 1.5 (*)    All other components within normal limits  LIPASE, BLOOD  URINALYSIS, ROUTINE W REFLEX MICROSCOPIC  CEA  COMPREHENSIVE METABOLIC PANEL  CBC    EKG None  Radiology Dg Chest 2 View  Result Date: 07/12/2018 CLINICAL DATA:  Chest mass EXAM: CHEST - 2 VIEW COMPARISON:  07/08/2018, bone scan 07/12/2018 FINDINGS: Low lung volume. No pleural effusion. Borderline to mild cardiomegaly. No pneumothorax. Scarring within the anterior lung base. IMPRESSION: No active cardiopulmonary disease.  Borderline to mild cardiomegaly. Electronically Signed   By: Donavan Foil M.D.   On: 07/12/2018 19:41   Nm Bone Scan Whole Body  Result Date:  07/12/2018 CLINICAL DATA:  Chronic BILATERAL low back pain with sciatica, history colon cancer EXAM: NUCLEAR MEDICINE WHOLE BODY BONE SCAN TECHNIQUE: Whole body anterior and posterior images were obtained approximately 3 hours after intravenous injection of radiopharmaceutical. RADIOPHARMACEUTICALS:  20.2 mCi Technetium-29mMDP IV COMPARISON:  None Radiographic correlation: CT abdomen pelvis 07/12/2018 FINDINGS: Motion artifacts at head/shoulders. Uptake at the shoulders, knees and feet typically degenerative. Small focus of abnormal increased tracer localization is seen at the posterior RIGHT eleventh rib with adjacent areas of slightly decreased tracer localization at the medial posterior RIGHT tenth and eleventh ribs. These correspond to a pathologic fracture of the RIGHT eleventh rib and to a large posterior chest wall destructive soft tissue mass seen on CT which extends into the RIGHT paraspinal muscles. No additional sites of abnormal osseous tracer accumulation are identified. Expected urinary tract and soft tissue distribution of tracer. IMPRESSION: Foci of decreased uptake in the posterior medial RIGHT tenth and eleventh ribs with additional uptake at a pathologic fracture of the posterior RIGHT eleventh rib, all at a site of a destructive chest wall soft tissue mass identified on CT. No additional scintigraphic abnormalities identified. Electronically Signed   By: MLavonia DanaM.D.   On: 07/12/2018 17:49   Ct Abdomen Pelvis W Contrast  Result Date: 07/12/2018 CLINICAL DATA:  Back pain and BILATERAL hip pain after 3 falls in the past week, worsening abdominal and back pain, blunt abdominal trauma, stable, history COPD, colon cancer, hypertension, former smoker EXAM: CT ABDOMEN AND PELVIS WITH CONTRAST TECHNIQUE: Multidetector CT imaging of the abdomen and pelvis was performed using the standard protocol following bolus administration of intravenous contrast. Sagittal and coronal MPR images  reconstructed  from axial data set. Additional reconstructed images of the CONTRAST:  8m ISOVUE-300 IOPAMIDOL (ISOVUE-300) INJECTION 61% IV. No oral contrast. COMPARISON:  06/11/2018 FINDINGS: Lower chest: Subsegmental atelectasis BILATERAL lower lobes Hepatobiliary: Gallbladder surgically absent.  Liver unremarkable. Pancreas: Atrophic without mass Spleen: Calcified granuloma.  Otherwise normal appearance. Adrenals/Urinary Tract: Adrenal glands unremarkable. Mass in LEFT perinephric space lateral to mid LEFT kidney measuring 3.0 x 2.3 x 3.0 cm in size, increased since 06/11/2018. This abuts the cortex of the LEFT kidney but appears to arise within the adjacent perinephric space rather than renal parenchyma. Kidneys, ureters, and bladder otherwise normal appearance. Stomach/Bowel: Prior RIGHT hemicolectomy. Mild sigmoid diverticulosis. Stomach and bowel loops otherwise normal appearance. Vascular/Lymphatic: Atherosclerotic calcifications aorta without aneurysm. No adenopathy. Reproductive: Uterus surgically absent. Nonvisualization of ovaries. Other: No free air or free fluid. No acute inflammatory process. No hernia. Musculoskeletal: Subcutaneous nodule anterior LEFT mid abdomen, 2.2 x 1.4 x 1.7 cm increased since prior exam question subcutaneous metastasis. Large to soft tissue mass identified at the RIGHT paraspinal region extending from T8-L2 measuring 10.5 x 6.1 x 8.3 cm in size consistent with a large chest wall metastasis. Associated destruction of the posterior RIGHT tenth and eleventh ribs with a pathologic fracture of the posterior eleventh rib. Mass involves the RIGHT paraspinal muscles and traverses the midline into the medial LEFT paraspinal muscles. Pathologic fractures are also seen at the spinous processes of T8 and T9 with additional bone destruction at the posterior elements of RIGHT T9, T10 and T11. Mass extends into the RIGHT T10-T11 neural foramen. IMPRESSION: Large destructive chest wall mass posterior  RIGHT paraspinal at T10-L2 measuring 10.5 x 6.1 x 8.3 cm in size, with associated bone destruction involving the RIGHT posterior elements of T9, T10 and T11, spinous processes of T8 and T9, and the posterior RIGHT tenth and eleventh ribs as above. Probable subcutaneous metastasis anterior LEFT abdominal wall 2.2 cm greatest size. Enlarging mass in the LEFT perinephric space 3.0 cm in size, question metastasis versus renal tumor. Electronically Signed   By: MLavonia DanaM.D.   On: 07/12/2018 18:17   Ct L-spine No Charge  Result Date: 07/12/2018 CLINICAL DATA:  Back pain and bilateral hip pain. Multiple recent falls. EXAM: CT LUMBAR SPINE WITHOUT CONTRAST TECHNIQUE: Multidetector CT imaging of the lumbar spine was performed without intravenous contrast administration. Multiplanar CT image reconstructions were also generated. COMPARISON:  None. FINDINGS: Segmentation: 5 lumbar type vertebrae. Alignment: Left convex scoliosis apexes at L4. Vertebrae: There is a soft tissue mass centered in the right paraspinous musculature at the T10-T11 level, extending inferiorly as far as the L1 level. The mass is predominantly hyperdense and has increased in size over time. There is associated destruction of the right transverse process of T11 and T12. Paraspinal and other soft tissues: Please refer to dedicated CT abdomen pelvis report. Disc levels: T11-T12: Suspected encroachment on the right neural foramen by the paraspinous mass. No other stenosis. T12-L1: Disc vacuum phenomenon without stenosis. L1-L2: Disc vacuum phenomenon with moderate left foraminal stenosis. L2-L3: Disc space narrowing with severe left foraminal stenosis. L3-L4: Disc space narrowing with prominent endplate osteophytes. Severe bilateral neural foraminal stenosis. Mild spinal canal stenosis. L4-L5: Disc space narrowing with endplate osteophytes. Severe right and moderate left foraminal stenosis. L5-S1: Disc space narrowing with moderate facet hypertrophy.  Severe right and mild left foraminal stenosis. IMPRESSION: 1. Paraspinous soft tissue mass on the right at the lower thoracic spine, likely metastatic disease. This causes destruction of the right  T11 and 12 transverse processes and may encroach on the right T11 neural foramen. 2. Please refer to dedicated report for concomitant CT abdomen pelvis for soft tissue findings. 3. Multilevel severe neural foraminal stenosis. Electronically Signed   By: Ulyses Jarred M.D.   On: 07/12/2018 18:10   Dg Hips Bilat W Or Wo Pelvis 2 Views  Result Date: 07/12/2018 CLINICAL DATA:  Bilateral hip pain after multiple falls. EXAM: DG HIP (WITH OR WITHOUT PELVIS) 2V BILAT COMPARISON:  None. FINDINGS: There is no evidence of hip fracture or dislocation. There is no evidence of arthropathy or other focal bone abnormality. IMPRESSION: Normal bilateral hips. Electronically Signed   By: Marijo Conception, M.D.   On: 07/12/2018 16:28    Procedures Procedures (including critical care time)  Medications Ordered in ED Medications  morphine 4 MG/ML injection 4 mg (4 mg Intravenous Given 07/12/18 1534)  ondansetron (ZOFRAN) injection 4 mg (4 mg Intravenous Given 07/12/18 1533)  iopamidol (ISOVUE-300) 61 % injection 80 mL (80 mLs Intravenous Contrast Given 07/12/18 1726)  potassium chloride SA (K-DUR,KLOR-CON) CR tablet 40 mEq (40 mEq Oral Given 07/12/18 2025)     Initial Impression / Assessment and Plan / ED Course  I have reviewed the triage vital signs and the nursing notes.  Pertinent labs & imaging results that were available during my care of the patient were reviewed by me and considered in my medical decision making (see chart for details).  Presents for evaluation of multiple falls with persistent back pain, as well as some abdominal pain.  On arrival patient tachycardic and hypertensive, although I do feel this could be due to pain, patient does seem quite uncomfortable.  Patient is tender to palpation along the lower  thoracic and upper lumbar spine without obvious palpable deformity.  She is also diffusely tender across the lower abdomen with some guarding.  And has diffuse tenderness over bilateral hips, left> right.  Lower extremities are neurovascularly intact, with normal neurologic exam.  Denies head or neck trauma and there is no evidence of this on exam.  Will get abdominal labs, and CT abdomen pelvis with recon of the lumbar spine as well as x-ray of bilateral hips.  Labs overall reassuring, normal hemoglobin and no leukocytosis to suggest intra-abdominal bleeding or injury.  Patient has mild hypokalemia and hypochloremia, no other acute electrolyte derangements.  Creatinine is slightly improved from baseline, normal liver function, and normal lipase.  Urinalysis without signs of infection, no hematuria to suggest trauma to the urinary tract.  X-ray of hips is negative for acute fracture.  CT shows Large destructive chest wall mass posterior RIGHT paraspinal at T10-L2 measuring 10.5 x 6.1 x 8.3 cm in size, with associated bone destruction involving the RIGHT posterior elements of T9, T10 and T11, spinous processes of T8 and T9, and the posterior RIGHT tenth and eleventh ribs, pathologic fractures of the 10th and 11th ribs. Probable subcutaneous metastasis anterior LEFT abdominal wall 2.2 cm greatest size. Enlarging mass in the LEFT perinephric space 3.0 cm in size.  These masses are concerning for possible metastasis, versus primary renal tumor, patient does have known history of colon cancer, but this has been in remission.  Discussed results with Dr. Darl Householder, and feel patient will require admission for pain control and further evaluation and work-up of these masses.  Case discussed with Dr. Hollice Gong with [facility] hospital[facility] who will see and admit the patient.   Final Clinical Impressions(s) / ED Diagnoses   Final diagnoses:  Fall  Chest wall mass  Renal mass, left  Pathological fracture of rib, initial  encounter  Hypokalemia    ED Discharge Orders    None       Jacqlyn Larsen, [place] 07/13/18 0057    Drenda Freeze, MD 07/16/18 [id number]

## 2018-07-12 NOTE — H&P (Signed)
History and Physical  Erica Escobar RDE:[id number] DOB: [birth year] DOA: 07/12/2018   PCP: Deland Pretty, MD   Patient coming from: Home   Chief Complaint: Back pain, weakness, falls   HPI: Erica Escobar is a 82 y.o. female with medical history significant for poorly differentiated colon adenocarcinoma s/p treatment in 2016 as well as chronic back pain who is being admitted with acute on chronic back pain found to have large posterior chest wall mass concerning for metastatic disease. The history is provided by the patient who says she had worsening of her chronic back pain about 2 weeks ago. Pain is in the low back and radiates across the whole back and into her left hip. She had had normal appetite but lost about 18 pounds unintentionally in the last year. Her oncologist is Dr. Benay Spice and she has had regular followup with the Oncology team with no evidence of disease recurrence thus far. She has been having some worsening constipation of late, but it is well controlled with Miralax. She has had five falls now in the last few months.   ED Course: In the ER, CT imaging was performed and showed evidence of a large soft tissue mass in the posterior chest wall with related bony destruction and rib fractures as detailed below. Also a perinephric mass and left anterior abdominal wall mass. Hospitalist was asked to consider admission for pain control, physical therapy and to expedite workup of these new soft tissue masses.  Review of Systems: Please see HPI for pertinent positives and negatives. A complete 10 system review of systems are otherwise negative.  Past Medical History:  Diagnosis Date  . Abnormal tympanic membrane    right ear and mastoid problems  . Arthritis    oseoarthritis  . Cancer of ascending colon ([place]) 02/15/2015  . Constipation, chronic   . COPD (chronic obstructive pulmonary disease) (HCC)    mild COPD  . Depression   . Diverticulosis   . GERD (gastroesophageal reflux  disease)   . Glaucoma    bilateral-sugery with laser, using eye drops  . Hemorrhoids    Internal  . History of hiatal hernia    dx. '87 Schatzki's ring  . Hyperlipidemia    good control  . Hypertension   . Insomnia    hx. of  . Keratosis, actinic   . Leg cramps   . Macular degeneration   . Neuromuscular disorder (HCC)    neuropathy legs,   . On aspirin at home    chronic use  . PONV (postoperative nausea and vomiting)   . Restless leg syndrome   . Schatzki's ring 1987  . Skin cancer    multiple, "tx. area right anterior wrist"  . Urinary incontinence    occ, stress   Past Surgical History:  Procedure Laterality Date  . ABDOMINAL HYSTERECTOMY    . BLADDER REPAIR    . CARDIAC CATHETERIZATION  2007, 2009  . CATARACT EXTRACTION Bilateral   . CHOLECYSTECTOMY    . COLECTOMY    . FINGER NAIL SURGERY    . FOOT SURGERY Right   . HEMORROIDECTOMY    . INNER EAR SURGERY Right    3-4 times  . INTRAOPERATIVE ARTERIOGRAM     right arm  . KNEE SURGERY Right   . LEG SURGERY Bilateral    laser ablation  . MASTOID DEBRIDEMENT     x2  . SKIN CANCER EXCISION     right/left thumb nail, left face, right ear/neck, right leg  .  TOE FUSION    . TONSILLECTOMY      Social History:  reports that she has quit smoking. She has never used smokeless tobacco. She reports that she does not drink alcohol or use drugs.   Allergies  Allergen Reactions  . Azithromycin Itching  . Allopurinol   . Cephalexin   . Codeine Itching  . Gabapentin Swelling    Throat and leg swelling  . Niaspan [Niacin]   . Oxybutynin Other (See Comments)    "made me go crazy"-- had crazy dreams.   . Paxil [Paroxetine Hcl]     Unknown reaction  . Penicillins Itching    Has patient had a PCN reaction causing immediate rash, facial/tongue/throat swelling, SOB or lightheadedness with hypotension: No Has patient had a PCN reaction causing severe rash involving mucus membranes or skin necrosis: Yes Has patient had  a PCN reaction that required hospitalization: No Has patient had a PCN reaction occurring within the last 10 years: No If all of the above answers are "NO", then may proceed with Cephalosporin use.   Marland Kitchen Pentazocine Itching and Nausea Only    Headache.   . Tizanidine Other (See Comments)    "made me crazy"  . Tramadol     Head feels "swimmy" and weakness  . Vicodin [Hydrocodone-Acetaminophen] Other (See Comments)    "made me go crazy"    No family history on file.   Prior to Admission medications   Medication Sig Start Date End Date Taking? Authorizing Provider  acetaminophen (TYLENOL) 500 MG tablet Take 2 tablets (1,000 mg total) by mouth every 6 (six) hours as needed. 06/11/18  Yes Charlesetta Shanks, MD  aspirin EC 81 MG tablet Take 81 mg by mouth every morning.   Yes [provider]  beta carotene w/minerals (OCUVITE) tablet Take 1 tablet by mouth daily.    Yes [provider]  calcium-vitamin D (OSCAL) 250-125 MG-UNIT per tablet Take 1 tablet by mouth every morning.   Yes [provider]  Cyanocobalamin (VITAMIN B 12 PO) Take 1,000 mg by mouth every morning.    Yes [provider]  cyclobenzaprine (FLEXERIL) 5 MG tablet Take 5 mg by mouth 3 (three) times daily. 06/29/18  Yes [provider]  furosemide (LASIX) 40 MG tablet Take 20 mg by mouth daily.    Yes [provider]  gemfibrozil (LOPID) 600 MG tablet Take 600 mg by mouth every morning.   Yes [provider]  loratadine (CLARITIN) 10 MG tablet Take 10 mg by mouth daily as needed for allergies.   Yes [provider]  meloxicam (MOBIC) 15 MG tablet Take 15 mg by mouth daily.  05/30/18  Yes [provider]  mirtazapine (REMERON) 15 MG tablet Take 7.5 mg by mouth at bedtime. 06/29/18  Yes [provider]  Multiple Vitamin (MULTIVITAMIN WITH MINERALS) TABS tablet Take 1 tablet by mouth every morning.   Yes [provider]  Omega-3 Fatty Acids  (FISH OIL PO) Take 2 capsules by mouth daily with breakfast.    Yes [provider]  oxyCODONE-acetaminophen (PERCOCET/ROXICET) 5-325 MG tablet Take 1 tablet by mouth every 4 (four) hours as needed for severe pain. 07/08/18  Yes Isla Pence, MD  pantoprazole (PROTONIX) 40 MG tablet Take 40 mg by mouth daily. 03/09/17  Yes [provider]  polyethylene glycol (MIRALAX / GLYCOLAX) packet Take 17 g by mouth daily as needed for moderate constipation.   Yes [provider]  Polyvinyl Alcohol-Povidone PF (REFRESH) 1.4-0.6 %  SOLN Place 6 drops into both eyes daily.   Yes [provider]  potassium chloride (K-DUR) 10 MEQ tablet Take 10 mEq by mouth daily.  06/08/18  Yes [provider]  promethazine (PHENERGAN) 25 MG tablet Take 12.5 mg by mouth every 8 (eight) hours as needed for nausea or vomiting.   Yes [provider]  trolamine salicylate (ASPERCREME) 10 % cream Apply 1 application topically as needed for muscle pain (as needed for back pain).   Yes [provider]  ULORIC 40 MG tablet Take 40 mg by mouth daily.  03/16/18  Yes [provider]    Physical Exam: BP (!) 169/85   Pulse 93   Temp 97.6 F (36.4 C) (Oral)   Resp 14   Ht 5' 3"  (1.6 m)   Wt 68.9 kg (152 lb)   SpO2 90%   BMI 26.93 kg/m   General:  Alert, oriented, calm, in no acute distress, appears her stated age Eyes: EOMI, clear conjuctivae, white sclerea Neck: supple, no masses, trachea mildline  Cardiovascular: RRR, no murmurs or rubs, no peripheral edema  Respiratory: clear to auscultation bilaterally, no wheezes, no crackles  Abdomen: soft, no rebound, some tenderness in LLQ, nondistended, normal bowel tones heard  Skin: dry, no rashes  Musculoskeletal: no joint effusions, normal range of motion, point tenderness along the lower thoracic spine Psychiatric: appropriate affect, normal speech  Neurologic: extraocular muscles intact, clear speech, moving  all extremities with intact sensorium            Labs on Admission:  Basic Metabolic Panel: Recent Labs  Lab 07/08/18 1606 07/12/18 1530  NA 140 142  K 3.4* 3.3*  CL 102 95*  CO2 28 31  GLUCOSE 103* 100*  BUN 24* 21  CREATININE 1.48* 1.29*  CALCIUM 9.8 11.3*   Liver Function Tests: Recent Labs  Lab 07/12/18 1530  AST 24  ALT 10  ALKPHOS 69  BILITOT 0.8  PROT 7.0  ALBUMIN 3.4*   Recent Labs  Lab 07/12/18 1530  LIPASE 27   No results for input(s): AMMONIA in the last 168 hours. CBC: Recent Labs  Lab 07/08/18 1606 07/12/18 1530  WBC 6.4 8.1  NEUTROABS 4.7 6.4  HGB 10.7* 12.2  HCT 29.6* 34.3*  MCV 93.7 93.5  PLT 347 368   Cardiac Enzymes: No results for input(s): CKTOTAL, CKMB, CKMBINDEX, TROPONINI in the last 168 hours.  BNP (last 3 results) No results for input(s): BNP in the last 8760 hours.  ProBNP (last 3 results) No results for input(s): PROBNP in the last 8760 hours.  CBG: No results for input(s): GLUCAP in the last 168 hours.  Radiological Exams on Admission: Dg Chest 2 View  Result Date: 07/12/2018 CLINICAL DATA:  Chest mass EXAM: CHEST - 2 VIEW COMPARISON:  07/08/2018, bone scan 07/12/2018 FINDINGS: Low lung volume. No pleural effusion. Borderline to mild cardiomegaly. No pneumothorax. Scarring within the anterior lung base. IMPRESSION: No active cardiopulmonary disease.  Borderline to mild cardiomegaly. Electronically Signed   By: Donavan Foil M.D.   On: 07/12/2018 19:41   Nm Bone Scan Whole Body  Result Date: 07/12/2018 CLINICAL DATA:  Chronic BILATERAL low back pain with sciatica, history colon cancer EXAM: NUCLEAR MEDICINE WHOLE BODY BONE SCAN TECHNIQUE: Whole body anterior and posterior images were obtained approximately 3 hours after intravenous injection of radiopharmaceutical. RADIOPHARMACEUTICALS:  20.2 mCi Technetium-5mMDP IV COMPARISON:  None Radiographic correlation: CT abdomen pelvis 07/12/2018 FINDINGS: Motion artifacts at  head/shoulders. Uptake at  the shoulders, knees and feet typically degenerative. Small focus of abnormal increased tracer localization is seen at the posterior RIGHT eleventh rib with adjacent areas of slightly decreased tracer localization at the medial posterior RIGHT tenth and eleventh ribs. These correspond to a pathologic fracture of the RIGHT eleventh rib and to a large posterior chest wall destructive soft tissue mass seen on CT which extends into the RIGHT paraspinal muscles. No additional sites of abnormal osseous tracer accumulation are identified. Expected urinary tract and soft tissue distribution of tracer. IMPRESSION: Foci of decreased uptake in the posterior medial RIGHT tenth and eleventh ribs with additional uptake at a pathologic fracture of the posterior RIGHT eleventh rib, all at a site of a destructive chest wall soft tissue mass identified on CT. No additional scintigraphic abnormalities identified. Electronically Signed   By: Lavonia Dana M.D.   On: 07/12/2018 17:49   Ct Abdomen Pelvis W Contrast  Result Date: 07/12/2018 CLINICAL DATA:  Back pain and BILATERAL hip pain after 3 falls in the past week, worsening abdominal and back pain, blunt abdominal trauma, stable, history COPD, colon cancer, hypertension, former smoker EXAM: CT ABDOMEN AND PELVIS WITH CONTRAST TECHNIQUE: Multidetector CT imaging of the abdomen and pelvis was performed using the standard protocol following bolus administration of intravenous contrast. Sagittal and coronal MPR images reconstructed from axial data set. Additional reconstructed images of the CONTRAST:  34m ISOVUE-300 IOPAMIDOL (ISOVUE-300) INJECTION 61% IV. No oral contrast. COMPARISON:  06/11/2018 FINDINGS: Lower chest: Subsegmental atelectasis BILATERAL lower lobes Hepatobiliary: Gallbladder surgically absent.  Liver unremarkable. Pancreas: Atrophic without mass Spleen: Calcified granuloma.  Otherwise normal appearance. Adrenals/Urinary Tract: Adrenal  glands unremarkable. Mass in LEFT perinephric space lateral to mid LEFT kidney measuring 3.0 x 2.3 x 3.0 cm in size, increased since 06/11/2018. This abuts the cortex of the LEFT kidney but appears to arise within the adjacent perinephric space rather than renal parenchyma. Kidneys, ureters, and bladder otherwise normal appearance. Stomach/Bowel: Prior RIGHT hemicolectomy. Mild sigmoid diverticulosis. Stomach and bowel loops otherwise normal appearance. Vascular/Lymphatic: Atherosclerotic calcifications aorta without aneurysm. No adenopathy. Reproductive: Uterus surgically absent. Nonvisualization of ovaries. Other: No free air or free fluid. No acute inflammatory process. No hernia. Musculoskeletal: Subcutaneous nodule anterior LEFT mid abdomen, 2.2 x 1.4 x 1.7 cm increased since prior exam question subcutaneous metastasis. Large to soft tissue mass identified at the RIGHT paraspinal region extending from T8-L2 measuring 10.5 x 6.1 x 8.3 cm in size consistent with a large chest wall metastasis. Associated destruction of the posterior RIGHT tenth and eleventh ribs with a pathologic fracture of the posterior eleventh rib. Mass involves the RIGHT paraspinal muscles and traverses the midline into the medial LEFT paraspinal muscles. Pathologic fractures are also seen at the spinous processes of T8 and T9 with additional bone destruction at the posterior elements of RIGHT T9, T10 and T11. Mass extends into the RIGHT T10-T11 neural foramen. IMPRESSION: Large destructive chest wall mass posterior RIGHT paraspinal at T10-L2 measuring 10.5 x 6.1 x 8.3 cm in size, with associated bone destruction involving the RIGHT posterior elements of T9, T10 and T11, spinous processes of T8 and T9, and the posterior RIGHT tenth and eleventh ribs as above. Probable subcutaneous metastasis anterior LEFT abdominal wall 2.2 cm greatest size. Enlarging mass in the LEFT perinephric space 3.0 cm in size, question metastasis versus renal tumor.  Electronically Signed   By: MLavonia DanaM.D.   On: 07/12/2018 18:17   Ct L-spine No Charge  Result Date: 07/12/2018 CLINICAL DATA:  Back pain and bilateral hip pain. Multiple recent falls. EXAM: CT LUMBAR SPINE WITHOUT CONTRAST TECHNIQUE: Multidetector CT imaging of the lumbar spine was performed without intravenous contrast administration. Multiplanar CT image reconstructions were also generated. COMPARISON:  None. FINDINGS: Segmentation: 5 lumbar type vertebrae. Alignment: Left convex scoliosis apexes at L4. Vertebrae: There is a soft tissue mass centered in the right paraspinous musculature at the T10-T11 level, extending inferiorly as far as the L1 level. The mass is predominantly hyperdense and has increased in size over time. There is associated destruction of the right transverse process of T11 and T12. Paraspinal and other soft tissues: Please refer to dedicated CT abdomen pelvis report. Disc levels: T11-T12: Suspected encroachment on the right neural foramen by the paraspinous mass. No other stenosis. T12-L1: Disc vacuum phenomenon without stenosis. L1-L2: Disc vacuum phenomenon with moderate left foraminal stenosis. L2-L3: Disc space narrowing with severe left foraminal stenosis. L3-L4: Disc space narrowing with prominent endplate osteophytes. Severe bilateral neural foraminal stenosis. Mild spinal canal stenosis. L4-L5: Disc space narrowing with endplate osteophytes. Severe right and moderate left foraminal stenosis. L5-S1: Disc space narrowing with moderate facet hypertrophy. Severe right and mild left foraminal stenosis. IMPRESSION: 1. Paraspinous soft tissue mass on the right at the lower thoracic spine, likely metastatic disease. This causes destruction of the right T11 and 12 transverse processes and may encroach on the right T11 neural foramen. 2. Please refer to dedicated report for concomitant CT abdomen pelvis for soft tissue findings. 3. Multilevel severe neural foraminal stenosis.  Electronically Signed   By: Ulyses Jarred M.D.   On: 07/12/2018 18:10   Dg Hips Bilat W Or Wo Pelvis 2 Views  Result Date: 07/12/2018 CLINICAL DATA:  Bilateral hip pain after multiple falls. EXAM: DG HIP (WITH OR WITHOUT PELVIS) 2V BILAT COMPARISON:  None. FINDINGS: There is no evidence of hip fracture or dislocation. There is no evidence of arthropathy or other focal bone abnormality. IMPRESSION: Normal bilateral hips. Electronically Signed   By: Marijo Conception, M.D.   On: 07/12/2018 16:28    Assessment/Plan Present on Admission: . Metastatic cancer ([place]) . Cancer of ascending colon s/p robotic colectomy 02/15/2015 . Restless leg syndrome . Depression . GERD (gastroesophageal reflux disease) . Chronic back pain . Pathologic rib fracture, initial encounter . Chest wall mass  Principal Problem:   Metastatic cancer ([place]) suspected due to large soft tissue mass and history of poorly differentiated colon adenocarcinoma. - hospital admission due to falls and need to expeditiously workup this new mass - oncology consult with Dr. Benay Spice in AM (needs to be called) - IR consultation placed to consider CT guided biopsy of soft tissue mass - check CEA level Active Problems:   Cancer of ascending colon s/p robotic colectomy 02/15/2015   Hypokalemia - likely due to diuretic use, replete orally and check K. Resume home K dosing.   Restless leg syndrome   Multiple falls at home - physical therapy evaluation   Depression   GERD (gastroesophageal reflux disease) - PO PPI   Chronic back pain - continue home oral analgesia, add additional PRN due to known rib fractures   Pathologic rib fracture, initial encounter - likely due to destruction from soft tissue mass and recent falls   CKD - unclear etiology, has had elevated Cr since the last 6 months or so, seems to be at her new baseline  DVT prophylaxis: Lovenox   Code Status: FULL   Family Communication: Family present at bedside in ER.  Disposition Plan: Likely home with [facility] or SNF at DC.   Consults called: None   Admission status: Inpatient  Time spent: 48 minutes  Kambree Krauss Marry Guan MD [facility] Pager [phone]  If 7PM-7AM, please contact night-coverage [email or web address] Password [facility]  07/12/2018, 8:28 PM

## 2018-07-13 ENCOUNTER — Inpatient Hospital Stay (HOSPITAL_COMMUNITY): Payer: Medicare Other

## 2018-07-13 DIAGNOSIS — C799 Secondary malignant neoplasm of unspecified site: Secondary | ICD-10-CM

## 2018-07-13 LAB — CBC
HCT: 30.2 % — ABNORMAL LOW (ref 36.0–46.0)
HEMOGLOBIN: 10.7 g/dL — AB (ref 12.0–15.0)
MCH: 33.2 pg (ref 26.0–34.0)
MCHC: 35.4 g/dL (ref 30.0–36.0)
MCV: 93.8 fL (ref 78.0–100.0)
Platelets: 370 10*3/uL (ref 150–400)
RBC: 3.22 MIL/uL — AB (ref 3.87–5.11)
RDW: 13.5 % (ref 11.5–15.5)
WBC: 6.3 10*3/uL (ref 4.0–10.5)

## 2018-07-13 LAB — COMPREHENSIVE METABOLIC PANEL
ALT: 11 U/L (ref 0–44)
ANION GAP: 11 (ref 5–15)
AST: 21 U/L (ref 15–41)
Albumin: 2.9 g/dL — ABNORMAL LOW (ref 3.5–5.0)
Alkaline Phosphatase: 58 U/L (ref 38–126)
BILIRUBIN TOTAL: 0.6 mg/dL (ref 0.3–1.2)
BUN: 19 mg/dL (ref 8–23)
CHLORIDE: 97 mmol/L — AB (ref 98–111)
CO2: 33 mmol/L — ABNORMAL HIGH (ref 22–32)
Calcium: 10.8 mg/dL — ABNORMAL HIGH (ref 8.9–10.3)
Creatinine, Ser: 1.27 mg/dL — ABNORMAL HIGH (ref 0.44–1.00)
GFR calc non Af Amer: 39 mL/min — ABNORMAL LOW (ref >60)
GFR, EST AFRICAN AMERICAN: 45 mL/min — AB (ref >60)
Glucose, Bld: 101 mg/dL — ABNORMAL HIGH (ref 70–99)
Potassium: 3.5 mmol/L (ref 3.5–5.1)
Sodium: 141 mmol/L (ref 135–145)
TOTAL PROTEIN: 6.1 g/dL — AB (ref 6.5–8.1)

## 2018-07-13 LAB — PROTIME-INR
INR: 1
Prothrombin Time: 13.1 seconds (ref 11.4–15.2)

## 2018-07-13 MED ORDER — ENOXAPARIN SODIUM 40 MG/0.4ML ~~LOC~~ SOLN
40.0000 mg | SUBCUTANEOUS | Status: DC
  Administered 2018-07-14 – 2018-07-16 (×3): 40 mg via SUBCUTANEOUS
  Filled 2018-07-13 (×2): qty 0.4

## 2018-07-13 MED ORDER — MIDAZOLAM HCL 2 MG/2ML IJ SOLN
INTRAMUSCULAR | Status: AC
  Filled 2018-07-13: qty 4

## 2018-07-13 MED ORDER — FENTANYL CITRATE (PF) 100 MCG/2ML IJ SOLN
INTRAMUSCULAR | Status: AC | PRN
  Administered 2018-07-13: 50 ug via INTRAVENOUS

## 2018-07-13 MED ORDER — FENTANYL CITRATE (PF) 100 MCG/2ML IJ SOLN
INTRAMUSCULAR | Status: AC
Start: 2018-07-13 — End: 2018-07-13
  Filled 2018-07-13: qty 2

## 2018-07-13 MED ORDER — MIDAZOLAM HCL 2 MG/2ML IJ SOLN
INTRAMUSCULAR | Status: AC | PRN
  Administered 2018-07-13: 1 mg via INTRAVENOUS

## 2018-07-13 MED ORDER — HYDRALAZINE HCL 20 MG/ML IJ SOLN
5.0000 mg | INTRAMUSCULAR | Status: DC | PRN
  Administered 2018-07-13: 5 mg via INTRAVENOUS
  Filled 2018-07-13 (×2): qty 1

## 2018-07-13 MED ORDER — LIDOCAINE HCL (PF) 1 % IJ SOLN
INTRAMUSCULAR | Status: AC | PRN
  Administered 2018-07-13: 10 mL

## 2018-07-13 MED ORDER — HYDROCORTISONE 1 % EX LOTN
TOPICAL_LOTION | Freq: Four times a day (QID) | CUTANEOUS | Status: DC | PRN
  Filled 2018-07-13 (×2): qty 118

## 2018-07-13 MED ORDER — MORPHINE SULFATE (PF) 2 MG/ML IV SOLN
2.0000 mg | INTRAVENOUS | Status: DC | PRN
  Administered 2018-07-13 – 2018-07-16 (×4): 2 mg via INTRAVENOUS
  Filled 2018-07-13 (×4): qty 1

## 2018-07-13 MED ORDER — SODIUM CHLORIDE 0.9 % IV SOLN
INTRAVENOUS | Status: DC
  Administered 2018-07-13 (×2): via INTRAVENOUS

## 2018-07-13 MED ORDER — MAGNESIUM SULFATE 2 GM/50ML IV SOLN
2.0000 g | Freq: Once | INTRAVENOUS | Status: AC
  Administered 2018-07-13: 2 g via INTRAVENOUS
  Filled 2018-07-13: qty 50

## 2018-07-13 NOTE — Progress Notes (Signed)
PROGRESS NOTE    AMELIA BURGARD  UXL:[id number] DOB: 01-11-1936 DOA: 07/12/2018 PCP: Deland Pretty, MD     Brief Narrative:  Erica Escobar is a 82 y.o. female with medical history significant for poorly differentiated colon adenocarcinoma s/p treatment in 2016 as well as chronic back pain who is being admitted with acute on chronic back pain found to have large posterior chest wall mass concerning for metastatic disease. The history is provided by the patient who says she had worsening of her chronic back pain about 2 weeks ago. Pain is in the low back and radiates across the whole back and into her left hip. She had had normal appetite but lost about 30 pounds unintentionally in the last year. Her oncologist is Dr. Benay Spice and she has had regular followup with the Oncology team with no evidence of disease recurrence thus far. In the ER, CT imaging was performed and showed evidence of a large soft tissue mass in the posterior chest wall with related bony destruction and rib fractures as detailed below. Also a perinephric mass and left anterior abdominal wall mass. She was admitted for further work up and treatment.   New events last 24 hours / Subjective: No new events, continues to have low back pain which radiates upward to her shoulder blades. No complaints of chest pain, has some right sided abdominal pain which has been ongoing for 3 months. No nausea, vomiting. Admits to poor PO intake and decreased appetite along with unintentional weight loss.   Assessment & Plan:   Principal Problem:   Metastatic cancer ([place]) Active Problems:   Cancer of ascending colon s/p robotic colectomy 02/15/2015   Restless leg syndrome   Depression   GERD (gastroesophageal reflux disease)   Chronic back pain   Pathologic rib fracture, initial encounter   Chest wall mass  Concern for metastatic disease -CT A/P revealed large soft tissue chest wall mass posterior paraspinal with associated bone destruction  right posterior T9-T11, spinous process T8-T9, posterior right rib 10, 11, subcutaneous mets anterior left abdominal wall, enlarging mass left perinephric space  -PET with uptake at pathologic fx posterior right rib 11 and site of destructive chest wall soft tissue mass -IR consulted for CT guided biopsy -CEA pending  -Spoke with Dr. Benay Spice for consultation, he will follow up tomorrow AM. He recommends going forward with IR biopsy   Hx poorly differentiated adenocarcinoma of ascending colon cancer s/p right colectomy 2016 -Follows with Dr. Benay Spice   Acute on chronic back pain -Due to #1 -Pain control  Falls at home -PT eval  Hypomagnesemia -Replace, trend   CKD stage 3 -Baseline Cr 1.3-1.5 -Stable   Chronic diastolic HF -Without acute exacerbation, continue home lasix    DVT prophylaxis: Lovenox Code Status: Full Family Communication: At bedside Disposition Plan: Pending work up   Consultants:   IR  Oncology  Procedures:   None   Antimicrobials:  Anti-infectives (From admission, onward)   None       Objective: Vitals:   07/12/18 2054 07/12/18 2327 07/13/18 0302 07/13/18 0940  BP: (!) 141/87 136/78 (!) 145/82 (!) 163/87  Pulse: 100 98 91 93  Resp: 16 20  20   Temp:  98.6 F (37 C) 98.3 F (36.8 C)   TempSrc:  Oral Oral   SpO2: 97% 94% 97% 97%  Weight:  68.9 kg (151 lb 14.4 oz)    Height:  5' 3"  (1.6 m)      Intake/Output Summary (Last 24 hours)  at 07/13/2018 1055 Last data filed at 07/13/2018 0600 Gross per 24 hour  Intake 333.3 ml  Output 125 ml  Net 208.3 ml   Filed Weights   07/12/18 1338 07/12/18 2327  Weight: 68.9 kg (152 lb) 68.9 kg (151 lb 14.4 oz)    Examination:  General exam: Appears calm and comfortable  Respiratory system: Clear to auscultation. Respiratory effort normal. Cardiovascular system: S1 & S2 heard, RRR. No JVD, murmurs, rubs, gallops or clicks. No pedal edema. Gastrointestinal system: Abdomen is nondistended, soft  and nontender. No organomegaly or masses felt. Normal bowel sounds heard. Central nervous system: Alert and oriented. No focal neurological deficits. Extremities: Symmetric in appearance  Skin: No rashes, lesions or ulcers Psychiatry: Judgement and insight appear normal. Mood & affect appropriate.   Data Reviewed: I have personally reviewed following labs and imaging studies  CBC: Recent Labs  Lab 07/08/18 1606 07/12/18 1530 07/13/18 0439  WBC 6.4 8.1 6.3  NEUTROABS 4.7 6.4  --   HGB 10.7* 12.2 10.7*  HCT 29.6* 34.3* 30.2*  MCV 93.7 93.5 93.8  PLT 347 368 448   Basic Metabolic Panel: Recent Labs  Lab 07/08/18 1606 07/12/18 1530 07/12/18 1957 07/13/18 0439  NA 140 142  --  141  K 3.4* 3.3*  --  3.5  CL 102 95*  --  97*  CO2 28 31  --  33*  GLUCOSE 103* 100*  --  101*  BUN 24* 21  --  19  CREATININE 1.48* 1.29*  --  1.27*  CALCIUM 9.8 11.3*  --  10.8*  MG  --   --  1.5*  --    GFR: Estimated Creatinine Clearance: 33.5 mL/min (A) (by C-G formula based on SCr of 1.27 mg/dL (H)). Liver Function Tests: Recent Labs  Lab 07/12/18 1530 07/13/18 0439  AST 24 21  ALT 10 11  ALKPHOS 69 58  BILITOT 0.8 0.6  PROT 7.0 6.1*  ALBUMIN 3.4* 2.9*   Recent Labs  Lab 07/12/18 1530  LIPASE 27   No results for input(s): AMMONIA in the last 168 hours. Coagulation Profile: Recent Labs  Lab 07/13/18 0910  INR 1.00   Cardiac Enzymes: No results for input(s): CKTOTAL, CKMB, CKMBINDEX, TROPONINI in the last 168 hours. BNP (last 3 results) No results for input(s): PROBNP in the last 8760 hours. HbA1C: No results for input(s): HGBA1C in the last 72 hours. CBG: No results for input(s): GLUCAP in the last 168 hours. Lipid Profile: No results for input(s): CHOL, HDL, LDLCALC, TRIG, CHOLHDL, LDLDIRECT in the last 72 hours. Thyroid Function Tests: No results for input(s): TSH, T4TOTAL, FREET4, T3FREE, THYROIDAB in the last 72 hours. Anemia Panel: No results for input(s):  VITAMINB12, FOLATE, FERRITIN, TIBC, IRON, RETICCTPCT in the last 72 hours. Sepsis Labs: No results for input(s): PROCALCITON, LATICACIDVEN in the last 168 hours.  No results found for this or any previous visit (from the past 240 hour(s)).     Radiology Studies: Dg Chest 2 View  Result Date: 07/12/2018 CLINICAL DATA:  Chest mass EXAM: CHEST - 2 VIEW COMPARISON:  07/08/2018, bone scan 07/12/2018 FINDINGS: Low lung volume. No pleural effusion. Borderline to mild cardiomegaly. No pneumothorax. Scarring within the anterior lung base. IMPRESSION: No active cardiopulmonary disease.  Borderline to mild cardiomegaly. Electronically Signed   By: Donavan Foil M.D.   On: 07/12/2018 19:41   Nm Bone Scan Whole Body  Result Date: 07/12/2018 CLINICAL DATA:  Chronic BILATERAL low back pain with sciatica, history colon  cancer EXAM: NUCLEAR MEDICINE WHOLE BODY BONE SCAN TECHNIQUE: Whole body anterior and posterior images were obtained approximately 3 hours after intravenous injection of radiopharmaceutical. RADIOPHARMACEUTICALS:  20.2 mCi Technetium-42mMDP IV COMPARISON:  None Radiographic correlation: CT abdomen pelvis 07/12/2018 FINDINGS: Motion artifacts at head/shoulders. Uptake at the shoulders, knees and feet typically degenerative. Small focus of abnormal increased tracer localization is seen at the posterior RIGHT eleventh rib with adjacent areas of slightly decreased tracer localization at the medial posterior RIGHT tenth and eleventh ribs. These correspond to a pathologic fracture of the RIGHT eleventh rib and to a large posterior chest wall destructive soft tissue mass seen on CT which extends into the RIGHT paraspinal muscles. No additional sites of abnormal osseous tracer accumulation are identified. Expected urinary tract and soft tissue distribution of tracer. IMPRESSION: Foci of decreased uptake in the posterior medial RIGHT tenth and eleventh ribs with additional uptake at a pathologic fracture of  the posterior RIGHT eleventh rib, all at a site of a destructive chest wall soft tissue mass identified on CT. No additional scintigraphic abnormalities identified. Electronically Signed   By: MLavonia DanaM.D.   On: 07/12/2018 17:49   Ct Abdomen Pelvis W Contrast  Result Date: 07/12/2018 CLINICAL DATA:  Back pain and BILATERAL hip pain after 3 falls in the past week, worsening abdominal and back pain, blunt abdominal trauma, stable, history COPD, colon cancer, hypertension, former smoker EXAM: CT ABDOMEN AND PELVIS WITH CONTRAST TECHNIQUE: Multidetector CT imaging of the abdomen and pelvis was performed using the standard protocol following bolus administration of intravenous contrast. Sagittal and coronal MPR images reconstructed from axial data set. Additional reconstructed images of the CONTRAST:  852mISOVUE-300 IOPAMIDOL (ISOVUE-300) INJECTION 61% IV. No oral contrast. COMPARISON:  06/11/2018 FINDINGS: Lower chest: Subsegmental atelectasis BILATERAL lower lobes Hepatobiliary: Gallbladder surgically absent.  Liver unremarkable. Pancreas: Atrophic without mass Spleen: Calcified granuloma.  Otherwise normal appearance. Adrenals/Urinary Tract: Adrenal glands unremarkable. Mass in LEFT perinephric space lateral to mid LEFT kidney measuring 3.0 x 2.3 x 3.0 cm in size, increased since 06/11/2018. This abuts the cortex of the LEFT kidney but appears to arise within the adjacent perinephric space rather than renal parenchyma. Kidneys, ureters, and bladder otherwise normal appearance. Stomach/Bowel: Prior RIGHT hemicolectomy. Mild sigmoid diverticulosis. Stomach and bowel loops otherwise normal appearance. Vascular/Lymphatic: Atherosclerotic calcifications aorta without aneurysm. No adenopathy. Reproductive: Uterus surgically absent. Nonvisualization of ovaries. Other: No free air or free fluid. No acute inflammatory process. No hernia. Musculoskeletal: Subcutaneous nodule anterior LEFT mid abdomen, 2.2 x 1.4 x 1.7  cm increased since prior exam question subcutaneous metastasis. Large to soft tissue mass identified at the RIGHT paraspinal region extending from T8-L2 measuring 10.5 x 6.1 x 8.3 cm in size consistent with a large chest wall metastasis. Associated destruction of the posterior RIGHT tenth and eleventh ribs with a pathologic fracture of the posterior eleventh rib. Mass involves the RIGHT paraspinal muscles and traverses the midline into the medial LEFT paraspinal muscles. Pathologic fractures are also seen at the spinous processes of T8 and T9 with additional bone destruction at the posterior elements of RIGHT T9, T10 and T11. Mass extends into the RIGHT T10-T11 neural foramen. IMPRESSION: Large destructive chest wall mass posterior RIGHT paraspinal at T10-L2 measuring 10.5 x 6.1 x 8.3 cm in size, with associated bone destruction involving the RIGHT posterior elements of T9, T10 and T11, spinous processes of T8 and T9, and the posterior RIGHT tenth and eleventh ribs as above. Probable subcutaneous metastasis anterior LEFT  abdominal wall 2.2 cm greatest size. Enlarging mass in the LEFT perinephric space 3.0 cm in size, question metastasis versus renal tumor. Electronically Signed   By: Lavonia Dana M.D.   On: 07/12/2018 18:17   Ct L-spine No Charge  Result Date: 07/12/2018 CLINICAL DATA:  Back pain and bilateral hip pain. Multiple recent falls. EXAM: CT LUMBAR SPINE WITHOUT CONTRAST TECHNIQUE: Multidetector CT imaging of the lumbar spine was performed without intravenous contrast administration. Multiplanar CT image reconstructions were also generated. COMPARISON:  None. FINDINGS: Segmentation: 5 lumbar type vertebrae. Alignment: Left convex scoliosis apexes at L4. Vertebrae: There is a soft tissue mass centered in the right paraspinous musculature at the T10-T11 level, extending inferiorly as far as the L1 level. The mass is predominantly hyperdense and has increased in size over time. There is associated  destruction of the right transverse process of T11 and T12. Paraspinal and other soft tissues: Please refer to dedicated CT abdomen pelvis report. Disc levels: T11-T12: Suspected encroachment on the right neural foramen by the paraspinous mass. No other stenosis. T12-L1: Disc vacuum phenomenon without stenosis. L1-L2: Disc vacuum phenomenon with moderate left foraminal stenosis. L2-L3: Disc space narrowing with severe left foraminal stenosis. L3-L4: Disc space narrowing with prominent endplate osteophytes. Severe bilateral neural foraminal stenosis. Mild spinal canal stenosis. L4-L5: Disc space narrowing with endplate osteophytes. Severe right and moderate left foraminal stenosis. L5-S1: Disc space narrowing with moderate facet hypertrophy. Severe right and mild left foraminal stenosis. IMPRESSION: 1. Paraspinous soft tissue mass on the right at the lower thoracic spine, likely metastatic disease. This causes destruction of the right T11 and 12 transverse processes and may encroach on the right T11 neural foramen. 2. Please refer to dedicated report for concomitant CT abdomen pelvis for soft tissue findings. 3. Multilevel severe neural foraminal stenosis. Electronically Signed   By: Ulyses Jarred M.D.   On: 07/12/2018 18:10   Dg Hips Bilat W Or Wo Pelvis 2 Views  Result Date: 07/12/2018 CLINICAL DATA:  Bilateral hip pain after multiple falls. EXAM: DG HIP (WITH OR WITHOUT PELVIS) 2V BILAT COMPARISON:  None. FINDINGS: There is no evidence of hip fracture or dislocation. There is no evidence of arthropathy or other focal bone abnormality. IMPRESSION: Normal bilateral hips. Electronically Signed   By: Marijo Conception, M.D.   On: 07/12/2018 16:28      Scheduled Meds: . aspirin EC  81 mg Oral q morning - 10a  . calcium-vitamin D  1 tablet Oral q morning - 10a  . cyclobenzaprine  5 mg Oral TID  . docusate sodium  100 mg Oral BID  . enoxaparin (LOVENOX) injection  40 mg Subcutaneous QHS  . febuxostat  40 mg  Oral Daily  . furosemide  20 mg Oral Daily  . gemfibrozil  600 mg Oral q morning - 10a  . Glycerin-Hypromellose-PEG 400  6 drop Both Eyes Daily  . meloxicam  15 mg Oral Daily  . mirtazapine  7.5 mg Oral QHS  . multivitamin  1 tablet Oral Daily  . pantoprazole  40 mg Oral Daily  . potassium chloride  10 mEq Oral Daily  . vitamin B-12  1,000 mcg Oral q morning - 10a   Continuous Infusions: . sodium chloride 75 mL/hr at 07/13/18 0600  . magnesium sulfate 1 - 4 g bolus IVPB       LOS: 1 day    Time spent: 35 minutes   Dessa Phi, DO [facility] [email or web address] Password [facility] 07/13/2018, 10:55 AM

## 2018-07-13 NOTE — Evaluation (Signed)
Physical Therapy Evaluation Patient Details Name: Erica Escobar MRN: [id number] DOB: 1936/05/27 Today's Date: 07/13/2018   History of Present Illness  Erica Escobar is a 82 y.o. female with medical history significant for poorly differentiated colon adenocarcinoma, chronic back pain who is being admitted 07/12/18 with acute on chronic back pain.  CT imaging  showed evidence of a large soft tissue mass in the posterior chest wall with related bony destruction left 11th  rib fracture,  pathological fractures T8/9 ,, destruction of the right T11 and 12 transverse processes and may encroach on the right T11 neural foramen.   Clinical Impression  The patient did mobilize to  Recliner with 1 assist and RW. Limited by feeling dizzy and some increased pain in the back. Pt admitted with above diagnosis. Pt currently with functional limitations due to the deficits listed below (see PT Problem List).  Pt will benefit from skilled PT to increase their independence and safety with mobility to allow discharge to the venue listed below.       Follow Up Recommendations SNF    Equipment Recommendations  None recommended by PT    Recommendations for Other Services       Precautions / Restrictions Precautions Precaution Comments: pathological fxs T spine, ribs      Mobility  Bed Mobility Overal bed mobility: Needs Assistance Bed Mobility: Supine to Sit     Supine to sit: Min assist;Mod assist     General bed mobility comments: extra time to mobilize to bed edge, gentle pull of bed pad to assist the patient . Patient able to sit upright.   Transfers Overall transfer level: Needs assistance Equipment used: Rolling walker (2 wheeled) Transfers: Sit to/from [facility] Sit to Stand: Mod assist Stand pivot transfers: Mod assist       General transfer comment: Steady assist to rise from bed , small steps to recliner. Complained of feeling dizzy. BP 146/93/ HR  95  Ambulation/Gait                Stairs            Wheelchair Mobility    Modified Rankin (Stroke Patients Only)       Balance Overall balance assessment: Needs assistance Sitting-balance support: Feet supported;No upper extremity supported Sitting balance-Leahy Scale: Good     Standing balance support: During functional activity;Bilateral upper extremity supported Standing balance-Leahy Scale: Fair                               Pertinent Vitals/Pain Pain Assessment: Faces Faces Pain Scale: Hurts little more Pain Location: left posterior lower back Pain Descriptors / Indicators: Aching Pain Intervention(s): Monitored during session;Premedicated before session;Repositioned    Home Living Family/patient expects to be discharged to:: Private residence Living Arrangements: Alone   Type of Home: Mobile home Home Access: Ramped entrance     Home Layout: One level Home Equipment: Environmental consultant - 4 wheels      Prior Function Level of Independence: Independent with assistive device(s)         Comments: has not driven in  3 months     Hand Dominance        Extremity/Trunk Assessment   Upper Extremity Assessment Upper Extremity Assessment: Generalized weakness    Lower Extremity Assessment Lower Extremity Assessment: Generalized weakness    Cervical / Trunk Assessment Cervical / Trunk Assessment: Normal  Communication   Communication: No difficulties;HOH  Cognition Arousal/Alertness: Awake/alert Behavior During Therapy: WFL for tasks assessed/performed Overall Cognitive Status: Within Functional Limits for tasks assessed                                        General Comments      Exercises     Assessment/Plan    PT Assessment Patient needs continued PT services  PT Problem List Decreased strength;Decreased range of motion;Decreased activity tolerance;Decreased mobility;Decreased knowledge of  precautions;Decreased safety awareness;Decreased knowledge of use of DME;Pain       PT Treatment Interventions DME instruction;Gait training;Therapeutic exercise;Functional mobility training;Therapeutic activities;Patient/family education    PT Goals (Current goals can be found in the Care Plan section)  Acute Rehab PT Goals Patient Stated Goal: agreed to mobilize. PT Goal Formulation: With patient/family Time For Goal Achievement: 07/27/18 Potential to Achieve Goals: Good    Frequency Min 2X/week   Barriers to discharge Decreased caregiver support      Co-evaluation               AM-PAC PT "6 Clicks" Daily Activity  Outcome Measure Difficulty turning over in bed (including adjusting bedclothes, sheets and blankets)?: A Lot Difficulty moving from lying on back to sitting on the side of the bed? : A Lot Difficulty sitting down on and standing up from a chair with arms (e.g., wheelchair, bedside commode, etc,.)?: A Lot Help needed moving to and from a bed to chair (including a wheelchair)?: Total Help needed walking in hospital room?: Total Help needed climbing 3-5 steps with a railing? : Total 6 Click Score: 9    End of Session   Activity Tolerance: Patient limited by fatigue Patient left: in chair;with call bell/phone within reach;with chair alarm set;with family/visitor present Nurse Communication: Mobility status PT Visit Diagnosis: Unsteadiness on feet (R26.81);Pain Pain - Right/Left: Left Pain - part of body: Hip    Time: 1100-1134 PT Time Calculation (min) (ACUTE ONLY): 34 min   Charges:   PT Evaluation $PT Eval Low Complexity: 1 Low PT Treatments $Therapeutic Activity: 8-22 mins        [facility] [phone]   Claretha Cooper 07/13/2018, 11:57 AM

## 2018-07-13 NOTE — Progress Notes (Signed)
PT Cancellation Note  Patient Details Name: Erica Escobar MRN: [id number] DOB: [birth year]   Cancelled Treatment:    Reason Eval/Treat Not Completed: Pain limiting ability to participate(going for biopsy)will check back another time.   Claretha Cooper 07/13/2018, 9:56 AM Tresa Endo PT [phone]

## 2018-07-13 NOTE — Plan of Care (Signed)
  Problem: Nutrition: Goal: Adequate nutrition will be maintained Outcome: Progressing   Problem: Safety: Goal: Ability to remain free from injury will improve Outcome: Progressing   Problem: Elimination: Goal: Will not experience complications related to bowel motility Outcome: Progressing

## 2018-07-13 NOTE — Progress Notes (Signed)
MEDICATION-RELATED CONSULT NOTE   IR Procedure Consult - Anticoagulant/Antiplatelet PTA/Inpatient Med List Review by Pharmacist    Procedure: Biopsy of R paraspinal mass    Completed: 07/13/2018 14:30  Post-Procedural bleeding risk per IR MD assessment:  standard  Antithrombotic medications on inpatient or PTA profile prior to procedure:   Enoxaparin 24m SQ q24h ASA 842mdaily  Recommended restart time per IR Post-Procedure Guidelines:   Day + 1 (the next am)  Other considerations:      Plan:      Adjust enoxaparin 4021mo start 8/1 at 10am  ASA - next dose due 8/1 am  DusDoreene ElandharmD, BCPS.   07/13/2018 2:37 PM

## 2018-07-13 NOTE — Procedures (Signed)
Interventional Radiology Procedure Note  Procedure: CT guided biopsy of right paraspinal mass  Complications: None  Estimated Blood Loss: None  Recommendations: - Path pending - Bedrest x 2 hrs  Signed,  Criselda Peaches, MD

## 2018-07-13 NOTE — Progress Notes (Signed)
MD oncall notified Kennon Holter), advised of client's dietary status of NPO past MN, K on admission 3.3, 125 ml out for shift, no active fluid orders. Awaiting call back or order. Will continue to monitor client closely. Willene Hatchet , RN

## 2018-07-13 NOTE — Progress Notes (Signed)
Referring Physician(s): Erica Escobar  Supervising Physician: Erica Escobar  Patient Status:  Erica Escobar - In-pt  Chief Complaint:  Back/hip pain  Subjective: Patient familiar to IR service from prior right upper extremity arteriogram in 2011.  She has a history of colon cancer in 2016, status post partial colectomy.  She is currently not receiving any additional treatment.  She was admitted to [facility] yesterday with acute on chronic back pain / hip pain along with constipation and weight loss.  She has had multiple falls at home brought on by episodes of balance difficulties/vertigo.  Subsequent imaging revealed a large destructive chest wall mass posterior right paraspinal region at T10/L2 with associated bone destruction, probable subcutaneous metastasis anterior left abdominal wall in addition to enlarging left perinephric space mass.  Request now received for image guided biopsy of the right paraspinal/chest wall mass.  She currently denies fever, chest pain, worsening dyspnea, cough, nausea, vomiting or bleeding.  She does have occasional headaches and pruritis as well.  Past Medical History:  Diagnosis Date  . Abnormal tympanic membrane    right ear and mastoid problems  . Arthritis    oseoarthritis  . Cancer of ascending colon ([place]) 02/15/2015  . Constipation, chronic   . COPD (chronic obstructive pulmonary disease) (HCC)    mild COPD  . Depression   . Diverticulosis   . GERD (gastroesophageal reflux disease)   . Glaucoma    bilateral-sugery with laser, using eye drops  . Hemorrhoids    Internal  . History of hiatal hernia    dx. '87 Schatzki's ring  . Hyperlipidemia    good control  . Hypertension   . Insomnia    hx. of  . Keratosis, actinic   . Leg cramps   . Macular degeneration   . Neuromuscular disorder (HCC)    neuropathy legs,   . On aspirin at home    chronic use  . PONV (postoperative nausea and vomiting)   . Restless leg syndrome   .  Schatzki's ring 1987  . Skin cancer    multiple, "tx. area right anterior wrist"  . Urinary incontinence    occ, stress      Allergies: Azithromycin; Allopurinol; Cephalexin; Codeine; Gabapentin; Niaspan [niacin]; Oxybutynin; Paxil [paroxetine hcl]; Penicillins; Pentazocine; Tizanidine; Tramadol; and Vicodin [hydrocodone-acetaminophen]  Medications: Prior to Admission medications   Medication Sig Start Date End Date Taking? Authorizing Provider  acetaminophen (TYLENOL) 500 MG tablet Take 2 tablets (1,000 mg total) by mouth every 6 (six) hours as needed. 06/11/18  Yes Charlesetta Shanks, MD  aspirin EC 81 MG tablet Take 81 mg by mouth every morning.   Yes [provider]  beta carotene w/minerals (OCUVITE) tablet Take 1 tablet by mouth daily.    Yes [provider]  calcium-vitamin D (OSCAL) 250-125 MG-UNIT per tablet Take 1 tablet by mouth every morning.   Yes [provider]  Cyanocobalamin (VITAMIN B 12 PO) Take 1,000 mg by mouth every morning.    Yes [provider]  cyclobenzaprine (FLEXERIL) 5 MG tablet Take 5 mg by mouth 3 (three) times daily. 06/29/18  Yes [provider]  furosemide (LASIX) 40 MG tablet Take 20 mg by mouth daily.    Yes [provider]  gemfibrozil (LOPID) 600 MG tablet Take 600 mg by mouth every morning.   Yes [provider]  loratadine (CLARITIN) 10 MG tablet Take 10 mg by mouth daily as needed for allergies.   Yes [provider]  meloxicam (MOBIC) 15 MG tablet Take 15 mg by mouth daily.  05/30/18  Yes [provider]  mirtazapine (REMERON) 15 MG tablet Take 7.5 mg by mouth at bedtime. 06/29/18  Yes [provider]  Multiple Vitamin (MULTIVITAMIN WITH MINERALS) TABS tablet Take 1 tablet by mouth every morning.   Yes [provider]  Omega-3 Fatty Acids (FISH OIL PO) Take 2 capsules by mouth daily with breakfast.    Yes [provider]  oxyCODONE-acetaminophen  (PERCOCET/ROXICET) 5-325 MG tablet Take 1 tablet by mouth every 4 (four) hours as needed for severe pain. 07/08/18  Yes Isla Pence, MD  pantoprazole (PROTONIX) 40 MG tablet Take 40 mg by mouth daily. 03/09/17  Yes [provider]  polyethylene glycol (MIRALAX / GLYCOLAX) packet Take 17 g by mouth daily as needed for moderate constipation.   Yes [provider]  Polyvinyl Alcohol-Povidone PF (REFRESH) 1.4-0.6 % SOLN Place 6 drops into both eyes daily.   Yes [provider]  potassium chloride (K-DUR) 10 MEQ tablet Take 10 mEq by mouth daily.  06/08/18  Yes [provider]  promethazine (PHENERGAN) 25 MG tablet Take 12.5 mg by mouth every 8 (eight) hours as needed for nausea or vomiting.   Yes [provider]  trolamine salicylate (ASPERCREME) 10 % cream Apply 1 application topically as needed for muscle pain (as needed for back pain).   Yes [provider]  ULORIC 40 MG tablet Take 40 mg by mouth daily.  03/16/18  Yes [provider]     Vital Signs: BP (!) 163/87 (BP Location: Left Arm)   Pulse 93   Temp 98.3 F (36.8 C) (Oral)   Resp 20   Ht 5' 3"  (1.6 m)   Wt 151 lb 14.4 oz (68.9 kg)   SpO2 97%   BMI 26.91 kg/m   Physical Exam awake, alert.  Chest clear to auscultation bilaterally.  Heart with regular rate and rhythm.  Abdomen soft, positive bowel sounds, minimal generalized tenderness to palpation.  Upper to mid back pain noted.  No significant lower extremity edema.  Imaging: Dg Chest 2 View  Result Date: 07/12/2018 CLINICAL DATA:  Chest mass EXAM: CHEST - 2 VIEW COMPARISON:  07/08/2018, bone scan 07/12/2018 FINDINGS: Low lung volume. No pleural effusion. Borderline to mild cardiomegaly. No pneumothorax. Scarring within the anterior lung base. IMPRESSION: No active cardiopulmonary disease.  Borderline to mild cardiomegaly. Electronically Signed   By: Donavan Foil M.D.   On: 07/12/2018 19:41   Nm Bone Scan Whole  Body  Result Date: 07/12/2018 CLINICAL DATA:  Chronic BILATERAL low back pain with sciatica, history colon cancer EXAM: NUCLEAR MEDICINE WHOLE BODY BONE SCAN TECHNIQUE: Whole body anterior and posterior images were obtained approximately 3 hours after intravenous injection of radiopharmaceutical. RADIOPHARMACEUTICALS:  20.2 mCi Technetium-45mMDP IV COMPARISON:  None Radiographic correlation: CT abdomen pelvis 07/12/2018 FINDINGS: Motion artifacts at head/shoulders. Uptake at the shoulders, knees and feet typically degenerative. Small focus of abnormal increased tracer localization is seen at the posterior RIGHT eleventh rib with adjacent areas of slightly decreased tracer localization at the medial posterior RIGHT tenth and eleventh ribs. These correspond to a pathologic fracture of the RIGHT eleventh rib and to a large posterior chest wall destructive soft tissue mass seen on CT which extends into the RIGHT paraspinal muscles. No additional sites of abnormal osseous tracer accumulation are identified. Expected urinary tract and soft tissue distribution of tracer. IMPRESSION: Foci of decreased uptake in the posterior medial RIGHT  tenth and eleventh ribs with additional uptake at a pathologic fracture of the posterior RIGHT eleventh rib, all at a site of a destructive chest wall soft tissue mass identified on CT. No additional scintigraphic abnormalities identified. Electronically Signed   By: Lavonia Dana M.D.   On: 07/12/2018 17:49   Ct Abdomen Pelvis W Contrast  Result Date: 07/12/2018 CLINICAL DATA:  Back pain and BILATERAL hip pain after 3 falls in the past week, worsening abdominal and back pain, blunt abdominal trauma, stable, history COPD, colon cancer, hypertension, former smoker EXAM: CT ABDOMEN AND PELVIS WITH CONTRAST TECHNIQUE: Multidetector CT imaging of the abdomen and pelvis was performed using the standard protocol following bolus administration of intravenous contrast. Sagittal and coronal MPR  images reconstructed from axial data set. Additional reconstructed images of the CONTRAST:  49m ISOVUE-300 IOPAMIDOL (ISOVUE-300) INJECTION 61% IV. No oral contrast. COMPARISON:  06/11/2018 FINDINGS: Lower chest: Subsegmental atelectasis BILATERAL lower lobes Hepatobiliary: Gallbladder surgically absent.  Liver unremarkable. Pancreas: Atrophic without mass Spleen: Calcified granuloma.  Otherwise normal appearance. Adrenals/Urinary Tract: Adrenal glands unremarkable. Mass in LEFT perinephric space lateral to mid LEFT kidney measuring 3.0 x 2.3 x 3.0 cm in size, increased since 06/11/2018. This abuts the cortex of the LEFT kidney but appears to arise within the adjacent perinephric space rather than renal parenchyma. Kidneys, ureters, and bladder otherwise normal appearance. Stomach/Bowel: Prior RIGHT hemicolectomy. Mild sigmoid diverticulosis. Stomach and bowel loops otherwise normal appearance. Vascular/Lymphatic: Atherosclerotic calcifications aorta without aneurysm. No adenopathy. Reproductive: Uterus surgically absent. Nonvisualization of ovaries. Other: No free air or free fluid. No acute inflammatory process. No hernia. Musculoskeletal: Subcutaneous nodule anterior LEFT mid abdomen, 2.2 x 1.4 x 1.7 cm increased since prior exam question subcutaneous metastasis. Large to soft tissue mass identified at the RIGHT paraspinal region extending from T8-L2 measuring 10.5 x 6.1 x 8.3 cm in size consistent with a large chest wall metastasis. Associated destruction of the posterior RIGHT tenth and eleventh ribs with a pathologic fracture of the posterior eleventh rib. Mass involves the RIGHT paraspinal muscles and traverses the midline into the medial LEFT paraspinal muscles. Pathologic fractures are also seen at the spinous processes of T8 and T9 with additional bone destruction at the posterior elements of RIGHT T9, T10 and T11. Mass extends into the RIGHT T10-T11 neural foramen. IMPRESSION: Large destructive chest  wall mass posterior RIGHT paraspinal at T10-L2 measuring 10.5 x 6.1 x 8.3 cm in size, with associated bone destruction involving the RIGHT posterior elements of T9, T10 and T11, spinous processes of T8 and T9, and the posterior RIGHT tenth and eleventh ribs as above. Probable subcutaneous metastasis anterior LEFT abdominal wall 2.2 cm greatest size. Enlarging mass in the LEFT perinephric space 3.0 cm in size, question metastasis versus renal tumor. Electronically Signed   By: MLavonia DanaM.D.   On: 07/12/2018 18:17   Ct L-spine No Charge  Result Date: 07/12/2018 CLINICAL DATA:  Back pain and bilateral hip pain. Multiple recent falls. EXAM: CT LUMBAR SPINE WITHOUT CONTRAST TECHNIQUE: Multidetector CT imaging of the lumbar spine was performed without intravenous contrast administration. Multiplanar CT image reconstructions were also generated. COMPARISON:  None. FINDINGS: Segmentation: 5 lumbar type vertebrae. Alignment: Left convex scoliosis apexes at L4. Vertebrae: There is a soft tissue mass centered in the right paraspinous musculature at the T10-T11 level, extending inferiorly as far as the L1 level. The mass is predominantly hyperdense and has increased in size over time. There is associated destruction of the right transverse process of  T11 and T12. Paraspinal and other soft tissues: Please refer to dedicated CT abdomen pelvis report. Disc levels: T11-T12: Suspected encroachment on the right neural foramen by the paraspinous mass. No other stenosis. T12-L1: Disc vacuum phenomenon without stenosis. L1-L2: Disc vacuum phenomenon with moderate left foraminal stenosis. L2-L3: Disc space narrowing with severe left foraminal stenosis. L3-L4: Disc space narrowing with prominent endplate osteophytes. Severe bilateral neural foraminal stenosis. Mild spinal canal stenosis. L4-L5: Disc space narrowing with endplate osteophytes. Severe right and moderate left foraminal stenosis. L5-S1: Disc space narrowing with  moderate facet hypertrophy. Severe right and mild left foraminal stenosis. IMPRESSION: 1. Paraspinous soft tissue mass on the right at the lower thoracic spine, likely metastatic disease. This causes destruction of the right T11 and 12 transverse processes and may encroach on the right T11 neural foramen. 2. Please refer to dedicated report for concomitant CT abdomen pelvis for soft tissue findings. 3. Multilevel severe neural foraminal stenosis. Electronically Signed   By: Ulyses Jarred M.D.   On: 07/12/2018 18:10   Dg Hips Bilat W Or Wo Pelvis 2 Views  Result Date: 07/12/2018 CLINICAL DATA:  Bilateral hip pain after multiple falls. EXAM: DG HIP (WITH OR WITHOUT PELVIS) 2V BILAT COMPARISON:  None. FINDINGS: There is no evidence of hip fracture or dislocation. There is no evidence of arthropathy or other focal bone abnormality. IMPRESSION: Normal bilateral hips. Electronically Signed   By: Marijo Conception, M.D.   On: 07/12/2018 16:28    Labs:  CBC: Recent Labs    06/28/18 1135 07/08/18 1606 07/12/18 1530 07/13/18 0439  WBC 8.7 6.4 8.1 6.3  HGB 11.3* 10.7* 12.2 10.7*  HCT 31.6* 29.6* 34.3* 30.2*  PLT 312 347 368 370    COAGS: Recent Labs    07/13/18 0910  INR 1.00    BMP: Recent Labs    06/28/18 1135 07/08/18 1606 07/12/18 1530 07/13/18 0439  NA 143 140 142 141  K 3.8 3.4* 3.3* 3.5  CL 103 102 95* 97*  CO2 29 28 31  33*  GLUCOSE 96 103* 100* 101*  BUN 20 24* 21 19  CALCIUM 9.5 9.8 11.3* 10.8*  CREATININE 1.39* 1.48* 1.29* 1.27*  GFRNONAA 35* 32* 38* 39*  GFRAA 41* 38* 44* 45*    LIVER FUNCTION TESTS: Recent Labs    05/02/18 1513 06/28/18 1135 07/12/18 1530 07/13/18 0439  BILITOT 0.7 0.5 0.8 0.6  AST 14 17 24 21   ALT 11 9 10 11   ALKPHOS 56 61 69 58  PROT 6.5 6.2* 7.0 6.1*  ALBUMIN 3.5 3.1* 3.4* 2.9*    Assessment and Plan: Pt with history of colon cancer in 2016, status post partial colectomy.  She is currently not receiving any additional treatment.  She  was admitted to [facility] yesterday with acute on chronic back pain / hip pain along with constipation and weight loss.  She has had multiple falls at home brought on by episodes of balance difficulties/vertigo.  Subsequent imaging revealed a large destructive chest wall mass posterior right paraspinal region at T10/L2 with associated bone destruction, probable subcutaneous metastasis anterior left abdominal wall in addition to enlarging left perinephric space mass.  Request now received for image guided biopsy of the right paraspinal/chest wall mass.  Imaging studies have been reviewed by Dr. Laurence Ferrari.Risks and benefits discussed with the patient/family including, but not limited to bleeding, infection, damage to adjacent structures or low yield requiring additional tests.  All of the patient's questions were answered, patient is agreeable  to proceed. Consent signed and in chart.     Electronically Signed: D. Rowe Robert, PA-C 07/13/2018, 10:55 AM   I spent a total of 25 minutes at the the patient's bedside AND on the patient's [facility] floor or unit, greater than 50% of which was counseling/coordinating care for image guided biopsy of right paraspinal/chest wall mass    Patient ID: Erica [facility], female   DOB: May 20, 1936, 82 y.o.   MRN: [id number]

## 2018-07-14 ENCOUNTER — Encounter: Payer: Self-pay | Admitting: Radiation Oncology

## 2018-07-14 ENCOUNTER — Ambulatory Visit
Admit: 2018-07-14 | Discharge: 2018-07-14 | Disposition: A | Discharge status: Home / Self Care | Payer: Medicare Other | Source: Ambulatory Visit | Attending: Radiation Oncology | Admitting: Radiation Oncology

## 2018-07-14 ENCOUNTER — Ambulatory Visit
Admission: RE | Admit: 2018-07-14 | Discharge: 2018-07-14 | Disposition: A | Discharge status: Home / Self Care | Payer: Medicare Other | Source: Ambulatory Visit | Attending: Radiation Oncology | Admitting: Radiation Oncology

## 2018-07-14 ENCOUNTER — Ambulatory Visit
Admit: 2018-07-14 | Discharge: 2018-07-14 | Disposition: A | Discharge status: Home / Self Care | Payer: Medicare Other | Attending: Radiation Oncology | Admitting: Radiation Oncology

## 2018-07-14 DIAGNOSIS — R630 Anorexia: Secondary | ICD-10-CM

## 2018-07-14 DIAGNOSIS — R627 Adult failure to thrive: Secondary | ICD-10-CM

## 2018-07-14 DIAGNOSIS — H409 Unspecified glaucoma: Secondary | ICD-10-CM

## 2018-07-14 DIAGNOSIS — C799 Secondary malignant neoplasm of unspecified site: Secondary | ICD-10-CM

## 2018-07-14 DIAGNOSIS — C182 Malignant neoplasm of ascending colon: Secondary | ICD-10-CM

## 2018-07-14 DIAGNOSIS — Z85820 Personal history of malignant melanoma of skin: Secondary | ICD-10-CM

## 2018-07-14 DIAGNOSIS — D630 Anemia in neoplastic disease: Secondary | ICD-10-CM

## 2018-07-14 DIAGNOSIS — M549 Dorsalgia, unspecified: Secondary | ICD-10-CM

## 2018-07-14 DIAGNOSIS — Z6826 Body mass index (BMI) 26.0-26.9, adult: Secondary | ICD-10-CM

## 2018-07-14 LAB — BASIC METABOLIC PANEL
Anion gap: 9 (ref 5–15)
BUN: 22 mg/dL (ref 8–23)
CO2: 31 mmol/L (ref 22–32)
Calcium: 9.9 mg/dL (ref 8.9–10.3)
Chloride: 100 mmol/L (ref 98–111)
Creatinine, Ser: 1.22 mg/dL — ABNORMAL HIGH (ref 0.44–1.00)
GFR calc Af Amer: 48 mL/min — ABNORMAL LOW (ref >60)
GFR calc non Af Amer: 41 mL/min — ABNORMAL LOW (ref >60)
GLUCOSE: 112 mg/dL — AB (ref 70–99)
POTASSIUM: 4.4 mmol/L (ref 3.5–5.1)
Sodium: 140 mmol/L (ref 135–145)

## 2018-07-14 LAB — CBC
HEMATOCRIT: 27.8 % — AB (ref 36.0–46.0)
HEMOGLOBIN: 9.8 g/dL — AB (ref 12.0–15.0)
MCH: 33.1 pg (ref 26.0–34.0)
MCHC: 35.3 g/dL (ref 30.0–36.0)
MCV: 93.9 fL (ref 78.0–100.0)
Platelets: 326 10*3/uL (ref 150–400)
RBC: 2.96 MIL/uL — AB (ref 3.87–5.11)
RDW: 13.5 % (ref 11.5–15.5)
WBC: 6 10*3/uL (ref 4.0–10.5)

## 2018-07-14 LAB — CEA: CEA1: 2.2 ng/mL (ref 0.0–4.7)

## 2018-07-14 LAB — MAGNESIUM: Magnesium: 2 mg/dL (ref 1.7–2.4)

## 2018-07-14 MED ORDER — DEXAMETHASONE 4 MG PO TABS
4.0000 mg | ORAL_TABLET | Freq: Two times a day (BID) | ORAL | Status: DC
  Administered 2018-07-14 – 2018-07-16 (×5): 4 mg via ORAL
  Filled 2018-07-14 (×5): qty 1

## 2018-07-14 NOTE — Progress Notes (Signed)
PROGRESS NOTE    Erica Escobar  XVQ:[id number] DOB: [birth year] DOA: 07/12/2018 PCP: Deland Pretty, MD     Brief Narrative:  Erica Escobar is a 82 y.o. female with medical history significant for poorly differentiated colon adenocarcinoma s/p treatment in 2016 as well as chronic back pain who is being admitted with acute on chronic back pain found to have large posterior chest wall mass concerning for metastatic disease. The history is provided by the patient who says she had worsening of her chronic back pain about 2 weeks ago. Pain is in the low back and radiates across the whole back and into her left hip. She had had normal appetite but lost about 30 pounds unintentionally in the last year. Her oncologist is Dr. Benay Spice and she has had regular followup with the Oncology team with no evidence of disease recurrence thus far. In the ER, CT imaging was performed and showed evidence of a large soft tissue mass in the posterior chest wall with related bony destruction and rib fractures as detailed below. Also a perinephric mass and left anterior abdominal wall mass. She was admitted for further work up and treatment.  She underwent CT-guided biopsy of right paraspinal mass on 7/31.  New events last 24 hours / Subjective: Continues to complain of back pain, no new events or complaints.  Assessment & Plan:   Principal Problem:   Metastatic cancer ([place]) Active Problems:   Cancer of ascending colon s/p robotic colectomy 02/15/2015   Restless leg syndrome   Depression   GERD (gastroesophageal reflux disease)   Chronic back pain   Pathologic rib fracture, initial encounter   Chest wall mass  Concern for metastatic disease -CT A/P revealed large soft tissue chest wall mass posterior paraspinal with associated bone destruction right posterior T9-T11, spinous process T8-T9, posterior right rib 10, 11, subcutaneous mets anterior left abdominal wall, enlarging mass left perinephric space  -PET with  uptake at pathologic fx posterior right rib 11 and site of destructive chest wall soft tissue mass -CEA 2.2  -Status post CT-guided biopsy of right paraspinal mass 7/31, pathology pending -Appreciate Dr. Benay Spice consultation.  Radiation oncology consulted as well today  Hx poorly differentiated adenocarcinoma of ascending colon cancer s/p right colectomy 2016 -Follows with Dr. Benay Spice   Acute on chronic back pain -Due to #1 -Pain control  Falls at home -PT eval  CKD stage 3 -Baseline Cr 1.3-1.5 -Stable   Chronic diastolic HF -Without acute exacerbation, continue home lasix    DVT prophylaxis: Lovenox Code Status: Full Family Communication: No family at bedside Disposition Plan: Pending work up, will need skilled nursing facility on discharge   Consultants:   IR  Oncology  Procedures:   Status post CT-guided biopsy of right paraspinal mass 7/31  Antimicrobials:  Anti-infectives (From admission, onward)   None       Objective: Vitals:   07/13/18 2109 07/13/18 2317 07/14/18 0537 07/14/18 0645  BP: (!) 201/94 (!) 154/80 (!) 173/97 (!) 150/73  Pulse: 90 85 85   Resp: 18  16   Temp: 98.1 F (36.7 C)  98.1 F (36.7 C)   TempSrc: Oral  Oral   SpO2: 97% 99% 96%   Weight:      Height:        Intake/Output Summary (Last 24 hours) at 07/14/2018 1135 Last data filed at 07/13/2018 1400 Gross per 24 hour  Intake 890.46 ml  Output -  Net 890.46 ml   [employer]  07/12/18 1338 07/12/18 2327  Weight: 68.9 kg (152 lb) 68.9 kg (151 lb 14.4 oz)    Examination: General exam: Appears calm and comfortable  Respiratory system: Clear to auscultation. Respiratory effort normal. Cardiovascular system: S1 & S2 heard, RRR. No JVD, murmurs, rubs, gallops or clicks. No pedal edema. Gastrointestinal system: Abdomen is nondistended, soft and nontender. No organomegaly or masses felt. Normal bowel sounds heard. Central nervous system: Alert and oriented. No focal  neurological deficits. Extremities: Symmetric 5 x 5 power. Skin: No rashes, lesions or ulcers Psychiatry: Judgement and insight appear normal. Mood & affect appropriate.    Data Reviewed: I have personally reviewed following labs and imaging studies  CBC: Recent Labs  Lab 07/08/18 1606 07/12/18 1530 07/13/18 0439 07/14/18 0358  WBC 6.4 8.1 6.3 6.0  NEUTROABS 4.7 6.4  --   --   HGB 10.7* 12.2 10.7* 9.8*  HCT 29.6* 34.3* 30.2* 27.8*  MCV 93.7 93.5 93.8 93.9  PLT 347 368 370 416   Basic Metabolic Panel: Recent Labs  Lab 07/08/18 1606 07/12/18 1530 07/12/18 1957 07/13/18 0439 07/14/18 0358  NA 140 142  --  141 140  K 3.4* 3.3*  --  3.5 4.4  CL 102 95*  --  97* 100  CO2 28 31  --  33* 31  GLUCOSE 103* 100*  --  101* 112*  BUN 24* 21  --  19 22  CREATININE 1.48* 1.29*  --  1.27* 1.22*  CALCIUM 9.8 11.3*  --  10.8* 9.9  MG  --   --  1.5*  --  2.0   GFR: Estimated Creatinine Clearance: 34.8 mL/min (A) (by C-G formula based on SCr of 1.22 mg/dL (H)). Liver Function Tests: Recent Labs  Lab 07/12/18 1530 07/13/18 0439  AST 24 21  ALT 10 11  ALKPHOS 69 58  BILITOT 0.8 0.6  PROT 7.0 6.1*  ALBUMIN 3.4* 2.9*   Recent Labs  Lab 07/12/18 1530  LIPASE 27   No results for input(s): AMMONIA in the last 168 hours. Coagulation Profile: Recent Labs  Lab 07/13/18 0910  INR 1.00   Cardiac Enzymes: No results for input(s): CKTOTAL, CKMB, CKMBINDEX, TROPONINI in the last 168 hours. BNP (last 3 results) No results for input(s): PROBNP in the last 8760 hours. HbA1C: No results for input(s): HGBA1C in the last 72 hours. CBG: No results for input(s): GLUCAP in the last 168 hours. Lipid Profile: No results for input(s): CHOL, HDL, LDLCALC, TRIG, CHOLHDL, LDLDIRECT in the last 72 hours. Thyroid Function Tests: No results for input(s): TSH, T4TOTAL, FREET4, T3FREE, THYROIDAB in the last 72 hours. Anemia Panel: No results for input(s): VITAMINB12, FOLATE, FERRITIN, TIBC,  IRON, RETICCTPCT in the last 72 hours. Sepsis Labs: No results for input(s): PROCALCITON, LATICACIDVEN in the last 168 hours.  No results found for this or any previous visit (from the past 240 hour(s)).     Radiology Studies: Dg Chest 2 View  Result Date: 07/12/2018 CLINICAL DATA:  Chest mass EXAM: CHEST - 2 VIEW COMPARISON:  07/08/2018, bone scan 07/12/2018 FINDINGS: Low lung volume. No pleural effusion. Borderline to mild cardiomegaly. No pneumothorax. Scarring within the anterior lung base. IMPRESSION: No active cardiopulmonary disease.  Borderline to mild cardiomegaly. Electronically Signed   By: Donavan Foil M.D.   On: 07/12/2018 19:41   Nm Bone Scan Whole Body  Result Date: 07/12/2018 CLINICAL DATA:  Chronic BILATERAL low back pain with sciatica, history colon cancer EXAM: NUCLEAR MEDICINE WHOLE BODY BONE SCAN TECHNIQUE:  Whole body anterior and posterior images were obtained approximately 3 hours after intravenous injection of radiopharmaceutical. RADIOPHARMACEUTICALS:  20.2 mCi Technetium-69mMDP IV COMPARISON:  None Radiographic correlation: CT abdomen pelvis 07/12/2018 FINDINGS: Motion artifacts at head/shoulders. Uptake at the shoulders, knees and feet typically degenerative. Small focus of abnormal increased tracer localization is seen at the posterior RIGHT eleventh rib with adjacent areas of slightly decreased tracer localization at the medial posterior RIGHT tenth and eleventh ribs. These correspond to a pathologic fracture of the RIGHT eleventh rib and to a large posterior chest wall destructive soft tissue mass seen on CT which extends into the RIGHT paraspinal muscles. No additional sites of abnormal osseous tracer accumulation are identified. Expected urinary tract and soft tissue distribution of tracer. IMPRESSION: Foci of decreased uptake in the posterior medial RIGHT tenth and eleventh ribs with additional uptake at a pathologic fracture of the posterior RIGHT eleventh rib, all  at a site of a destructive chest wall soft tissue mass identified on CT. No additional scintigraphic abnormalities identified. Electronically Signed   By: MLavonia DanaM.D.   On: 07/12/2018 17:49   Ct Abdomen Pelvis W Contrast  Result Date: 07/12/2018 CLINICAL DATA:  Back pain and BILATERAL hip pain after 3 falls in the past week, worsening abdominal and back pain, blunt abdominal trauma, stable, history COPD, colon cancer, hypertension, former smoker EXAM: CT ABDOMEN AND PELVIS WITH CONTRAST TECHNIQUE: Multidetector CT imaging of the abdomen and pelvis was performed using the standard protocol following bolus administration of intravenous contrast. Sagittal and coronal MPR images reconstructed from axial data set. Additional reconstructed images of the CONTRAST:  871mISOVUE-300 IOPAMIDOL (ISOVUE-300) INJECTION 61% IV. No oral contrast. COMPARISON:  06/11/2018 FINDINGS: Lower chest: Subsegmental atelectasis BILATERAL lower lobes Hepatobiliary: Gallbladder surgically absent.  Liver unremarkable. Pancreas: Atrophic without mass Spleen: Calcified granuloma.  Otherwise normal appearance. Adrenals/Urinary Tract: Adrenal glands unremarkable. Mass in LEFT perinephric space lateral to mid LEFT kidney measuring 3.0 x 2.3 x 3.0 cm in size, increased since 06/11/2018. This abuts the cortex of the LEFT kidney but appears to arise within the adjacent perinephric space rather than renal parenchyma. Kidneys, ureters, and bladder otherwise normal appearance. Stomach/Bowel: Prior RIGHT hemicolectomy. Mild sigmoid diverticulosis. Stomach and bowel loops otherwise normal appearance. Vascular/Lymphatic: Atherosclerotic calcifications aorta without aneurysm. No adenopathy. Reproductive: Uterus surgically absent. Nonvisualization of ovaries. Other: No free air or free fluid. No acute inflammatory process. No hernia. Musculoskeletal: Subcutaneous nodule anterior LEFT mid abdomen, 2.2 x 1.4 x 1.7 cm increased since prior exam question  subcutaneous metastasis. Large to soft tissue mass identified at the RIGHT paraspinal region extending from T8-L2 measuring 10.5 x 6.1 x 8.3 cm in size consistent with a large chest wall metastasis. Associated destruction of the posterior RIGHT tenth and eleventh ribs with a pathologic fracture of the posterior eleventh rib. Mass involves the RIGHT paraspinal muscles and traverses the midline into the medial LEFT paraspinal muscles. Pathologic fractures are also seen at the spinous processes of T8 and T9 with additional bone destruction at the posterior elements of RIGHT T9, T10 and T11. Mass extends into the RIGHT T10-T11 neural foramen. IMPRESSION: Large destructive chest wall mass posterior RIGHT paraspinal at T10-L2 measuring 10.5 x 6.1 x 8.3 cm in size, with associated bone destruction involving the RIGHT posterior elements of T9, T10 and T11, spinous processes of T8 and T9, and the posterior RIGHT tenth and eleventh ribs as above. Probable subcutaneous metastasis anterior LEFT abdominal wall 2.2 cm greatest size. Enlarging mass in  the LEFT perinephric space 3.0 cm in size, question metastasis versus renal tumor. Electronically Signed   By: Lavonia Dana M.D.   On: 07/12/2018 18:17   Ct L-spine No Charge  Result Date: 07/12/2018 CLINICAL DATA:  Back pain and bilateral hip pain. Multiple recent falls. EXAM: CT LUMBAR SPINE WITHOUT CONTRAST TECHNIQUE: Multidetector CT imaging of the lumbar spine was performed without intravenous contrast administration. Multiplanar CT image reconstructions were also generated. COMPARISON:  None. FINDINGS: Segmentation: 5 lumbar type vertebrae. Alignment: Left convex scoliosis apexes at L4. Vertebrae: There is a soft tissue mass centered in the right paraspinous musculature at the T10-T11 level, extending inferiorly as far as the L1 level. The mass is predominantly hyperdense and has increased in size over time. There is associated destruction of the right transverse process of  T11 and T12. Paraspinal and other soft tissues: Please refer to dedicated CT abdomen pelvis report. Disc levels: T11-T12: Suspected encroachment on the right neural foramen by the paraspinous mass. No other stenosis. T12-L1: Disc vacuum phenomenon without stenosis. L1-L2: Disc vacuum phenomenon with moderate left foraminal stenosis. L2-L3: Disc space narrowing with severe left foraminal stenosis. L3-L4: Disc space narrowing with prominent endplate osteophytes. Severe bilateral neural foraminal stenosis. Mild spinal canal stenosis. L4-L5: Disc space narrowing with endplate osteophytes. Severe right and moderate left foraminal stenosis. L5-S1: Disc space narrowing with moderate facet hypertrophy. Severe right and mild left foraminal stenosis. IMPRESSION: 1. Paraspinous soft tissue mass on the right at the lower thoracic spine, likely metastatic disease. This causes destruction of the right T11 and 12 transverse processes and may encroach on the right T11 neural foramen. 2. Please refer to dedicated report for concomitant CT abdomen pelvis for soft tissue findings. 3. Multilevel severe neural foraminal stenosis. Electronically Signed   By: Ulyses Jarred M.D.   On: 07/12/2018 18:10   Ct Biopsy  Result Date: 07/13/2018 INDICATION: 82 year old female with a destructive mass centered in the right paraspinal musculature. She presents for CT-guided biopsy of the same. She has a history of colon cancer. EXAM: CT BIOPSY MEDICATIONS: None. ANESTHESIA/SEDATION: Moderate (conscious) sedation was employed during this procedure. A total of Versed 1 mg and Fentanyl 50 mcg was administered intravenously. Moderate Sedation Time: 10 minutes. The patient's level of consciousness and vital signs were monitored continuously by radiology nursing throughout the procedure under my direct supervision. FLUOROSCOPY TIME:  Fluoroscopy Time: 0 minutes 0 seconds (0 mGy). COMPLICATIONS: None immediate. PROCEDURE: Informed written consent was  obtained from the patient after a thorough discussion of the procedural risks, benefits and alternatives. All questions were addressed. Maximal Sterile Barrier Technique was utilized including caps, mask, sterile gowns, sterile gloves, sterile drape, hand hygiene and skin antiseptic. A timeout was performed prior to the initiation of the procedure. A planning axial CT scan was performed. The right paraspinal soft tissue mass was identified. The overlying skin was prepped and draped in standard fashion using chlorhexidine skin prep. Local anesthesia was attained by infiltration with 1% lidocaine. A small dermatotomy was made. Under intermittent CT guidance, a 17 gauge trocar needle was advanced into the periphery of the mass. Multiple 18 gauge core biopsies were then coaxially obtained using the bio Pince automated biopsy device. Biopsy specimens were placed in formalin and delivered to pathology for further analysis. IMPRESSION: Successful CT-guided biopsy of right paraspinal mass. Electronically Signed   By: Jacqulynn Cadet M.D.   On: 07/13/2018 14:38   Dg Hips Bilat W Or Wo Pelvis 2 Views  Result Date: 07/12/2018  CLINICAL DATA:  Bilateral hip pain after multiple falls. EXAM: DG HIP (WITH OR WITHOUT PELVIS) 2V BILAT COMPARISON:  None. FINDINGS: There is no evidence of hip fracture or dislocation. There is no evidence of arthropathy or other focal bone abnormality. IMPRESSION: Normal bilateral hips. Electronically Signed   By: Marijo Conception, M.D.   On: 07/12/2018 16:28      Scheduled Meds: . aspirin EC  81 mg Oral q morning - 10a  . cyclobenzaprine  5 mg Oral TID  . dexamethasone  4 mg Oral BID  . docusate sodium  100 mg Oral BID  . enoxaparin (LOVENOX) injection  40 mg Subcutaneous Q24H  . febuxostat  40 mg Oral Daily  . furosemide  20 mg Oral Daily  . gemfibrozil  600 mg Oral q morning - 10a  . Glycerin-Hypromellose-PEG 400  6 drop Both Eyes Daily  . meloxicam  15 mg Oral Daily  .  mirtazapine  7.5 mg Oral QHS  . multivitamin  1 tablet Oral Daily  . pantoprazole  40 mg Oral Daily  . potassium chloride  10 mEq Oral Daily  . vitamin B-12  1,000 mcg Oral q morning - 10a   Continuous Infusions: . sodium chloride 75 mL/hr at 07/13/18 1400     LOS: 2 days    Time spent: 20 minutes   Dessa Phi, DO [facility] [email or web address] Password [email or web address] 07/14/2018, 11:35 AM

## 2018-07-14 NOTE — Progress Notes (Signed)
  Radiation Oncology         [phone] ________________________________  Name: Erica Escobar MRN: [id number]  Date: 07/14/2018  DOB: 1936-07-01  SIMULATION AND TREATMENT PLANNING NOTE - INPATIENT    ICD-10-CM   1. Metastatic cancer ([facility]) C79.9     DIAGNOSIS:  Stage II Poorly Differentiated Adenocarcinoma of the Ascending Colon, now with Metastasis  NARRATIVE:  The patient was brought to the [facility].  Identity was confirmed.  All relevant records and images related to the planned course of therapy were reviewed.  The patient freely provided informed written consent to proceed with treatment after reviewing the details related to the planned course of therapy. The consent form was witnessed and verified by the simulation staff.  Then, the patient was set-up in a stable reproducible  supine position for radiation therapy.  CT images were obtained.  Surface markings were placed.  The CT images were loaded into the planning software.  Then the target and avoidance structures were contoured.  Treatment planning then occurred.  The radiation prescription was entered and confirmed.  Then, I designed and supervised the construction of a total of 4 medically necessary complex treatment devices.  I have requested : 3D Simulation  I have requested a DVH of the following structures: GTV, PTV, left and right kidney.  I have ordered: dose calculation.  PLAN:  The patient will receive 30 Gy in 10 fractions. Treatments will start later today.  -----------------------------------  Blair Promise, PhD, MD  This document serves as a record of services personally performed by Gery Pray, MD. It was created on his behalf by Wilburn Mylar, a trained medical scribe. The creation of this record is based on the scribe's personal observations and the provider's statements to them. This document has been checked and approved by the attending provider.

## 2018-07-14 NOTE — Progress Notes (Signed)
Initial Nutrition Assessment  DOCUMENTATION CODES:   Not applicable  INTERVENTION:    Magic cup TID with meals, each supplement provides 290 kcal and 9 grams of protein  NUTRITION DIAGNOSIS:   Increased nutrient needs related to cancer and cancer related treatments as evidenced by estimated needs.  GOAL:   Patient will meet greater than or equal to 90% of their needs  MONITOR:   Supplement acceptance, Weight trends, Labs, PO intake  REASON FOR ASSESSMENT:   Malnutrition Screening Tool    ASSESSMENT:   Patient with PMH significant for GERD, depression, and poorly differentiated colon adenocarcinoma s/p treatment in 2016. Presents this admission with concern for metastatic disease s/p CT A/P revealing large soft tissue chest wall mass.    7/31- pt underwent biopsy, radiation oncology consulted  RN reports has been a little confused today. Pt denies any loss in appetite PTA. States she usually eats three meals/day that typically consist of sandwich items like chicken, [place], cheese, and vegetables. She reports swallowing issues with drier meats and she often has to chew them longer. She does not wish to have chopped meats and denies having issues with meals this admission. She claims to have finished 100% of her eggs, bacon, and grits this morning. Records do not reflect any meal completions at this time. Pt does not like Ensure but is willing to try magic cups. RD to order.    Pt endorses a UBW of 160 lb and a recent wt loss on 10 lb. Records indicate pt weighed 160 lb 05/24/18 and 151 lb this admission (5.6% wt loss in two months, not significant for time frame). Nutrition-Focused physical exam completed.   Medications reviewed and include: 20 mg lasix once/day, Remeron, Prosight MVI, 10 mEq KCl once/day, vitamin B12 Labs reviewed.   NUTRITION - FOCUSED PHYSICAL EXAM:    Most Recent Value  Orbital Region  No depletion  Upper Arm Region  No depletion  Thoracic and Lumbar  Region  No depletion  Buccal Region  No depletion  Temple Region  No depletion  Clavicle Bone Region  No depletion  Clavicle and Acromion Bone Region  No depletion  Scapular Bone Region  No depletion  Dorsal Hand  No depletion  Patellar Region  No depletion  Anterior Thigh Region  No depletion  Posterior Calf Region  No depletion  Edema (RD Assessment)  None  Hair  Reviewed  Eyes  Reviewed  Mouth  Reviewed  Skin  Reviewed  Nails  Reviewed     Diet Order:   Diet Order           Diet regular Room service appropriate? Yes; Fluid consistency: Thin  Diet effective now          EDUCATION NEEDS:   Education needs have been addressed  Skin:  Skin Assessment: Reviewed RN Assessment  Last BM:  07/11/18  Height:   Ht Readings from Last 1 Encounters:  07/12/18 5' 3"  (1.6 m)    Weight:   Wt Readings from Last 1 Encounters:  07/12/18 151 lb 14.4 oz (68.9 kg)    Ideal Body Weight:  52.3 kg  BMI:  Body mass index is 26.91 kg/m.  Estimated Nutritional Needs:   Kcal:  1600-1800 kcal  Protein:  75-85 grams   Fluid:  >/= 1.6 L/day    Mariana Single RD, LDN Clinical Nutrition Pager # - [phone]

## 2018-07-14 NOTE — Progress Notes (Signed)
  Radiation Oncology         [phone] ________________________________  Name: Erica Escobar MRN: [id number]  Date: 07/14/2018  DOB: Apr 23, 1936  Simulation Verification Note    ICD-10-CM   1. Metastatic cancer ([place]) C79.9     Status: inpatient  NARRATIVE: The patient was brought to the treatment unit and placed in the planned treatment position. The clinical setup was verified. Then port films were obtained and uploaded to the radiation oncology medical record software.  The treatment beams were carefully compared against the planned radiation fields. The position location and shape of the radiation fields was reviewed. They targeted volume of tissue appears to be appropriately covered by the radiation beams. Organs at risk appear to be excluded as planned.  Based on my personal review, I approved the simulation verification. The patient's treatment will proceed as planned.  -----------------------------------  Blair Promise, PhD, MD  This document serves as a record of services personally performed by Gery Pray, MD. It was created on his behalf by Wilburn Mylar, a trained medical scribe. The creation of this record is based on the scribe's personal observations and the provider's statements to them. This document has been checked and approved by the attending provider.

## 2018-07-14 NOTE — Progress Notes (Signed)
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   HISTORY OF PRESENT ILLNESS::Erica Escobar is a 82 y.o. female  who has been admitted to the hospital after 3 occasions over the past week of pack pain and falls. She notes living by herself, and she's been able to walk by herself. X-rays taken on 07/07/18, 07/08/18, 07/12/18 were negative. On 07/12/18, a whole body bone scan was performed, which revealed: foci of decreased uptake in the posterior medial RIGHT tenth and eleventh ribs with additional uptake at a pathologic fracture of the posterior RIGHT eleventh rib, all at a site of a destructive chest wall soft tissue mass identified on CT. This was followed by an abdomen/pelvis CT scan which showed: large destructive chest wall mass posterior RIGHT paraspinal at T10-L2 measuring 10.5 x 6.1 x 8.3 cm in size, with associated bone destruction involving the RIGHT posterior elements of T9, T10 and T11, spinous processes of T8 and T9, and the posterior RIGHT tenth and eleventh ribs as above; probable subcutaneous metastasis anterior LEFT abdominal wall 2.2 cm greatest size; enlarging mass in the LEFT perinephric space 3.0 cm in size, question metastasis versus renal tumor. A L-spine CT scan was also performed 07/12/18 revealing: paraspinous soft tissue mass on the right at the lower thoracic spine, likely metastatic disease, this causes destruction of the right T11 and 12 transverse processes and may encroach on the right T11 neural foramen; multilevel severe neural foraminal  stenosis.  She has a surgical history of hysterectomy and colectomy, with no chemotherapy or radiation.  Cancer Staging Cancer of ascending colon s/p robotic colectomy 02/15/2015 Staging form: Colon and Rectum, AJCC 7th Edition - Pathologic: Stage IIA (T3, N0, cM0) - Signed by Ladell Pier, MD on 03/12/2015   Oncology History   12/26/14-Presented with anemia, darker than normal stools, abdominal pain and nausea.      Cancer of ascending colon s/p robotic colectomy 02/15/2015   01/01/2015 Imaging    CT ABDOMEN AND PELVIS WITH CONTRAST: 1. 3.7 x 2.3 x 2.7 cm mass in the mid ascending colon highlysuspicious for primary colonic neoplasm. No surrounding lymphadenopathy. No definite signs of metastatic disease in the abdomen or pelvis.  2. Atherosclerosis      01/01/2015 Procedure    Colonoscopy-Dr. Benson [place]: non-obstructing large mass at hepatic flexure and #2 sessile polyps in ascending colon      01/10/2015 Imaging    CT CHEST WITH CONTRAST:Negative for metastatic or acute disease      02/15/2015 Initial Diagnosis    XI ROBOT ASSISTED PROXIMAL RIGHT COLECTOMY SURGEON: Surgeon(s): Michael Boston, MD      02/15/2015 Pathologic Stage    Histologic type(s): Invasive adenocarcinoma. Histologic grade and differentiation: G3, poorly differentiated  Pathologic Staging: pT3, pN0, pMX      02/15/2015 Pathology Results    MLH1: LOSS OF NUCLEAR EXPRESSION (LESS THAN 5% TUMOR EXPRESSION) MSH2: Preserved nuclear expression (greater 50% tumor expression) MSH6: Preserved nuclear expression (greater 50% tumor expression) PMS2: LOSS OF NUCLEAR EXPRESSIO      02/15/2015 Clinical Stage    Stage II         She reports intense pain across her back, headaches, and tingling in her legs and shaking that started with the falls. She has an abdominal surgical scar that she notes is itchy. She reports no  fecal or urinary incontinence. She was ambulating prior to her most recent admission to the hospital  PREVIOUS RADIATION THERAPY: No  PAST MEDICAL HISTORY:  has a past medical history of Abnormal tympanic membrane, Arthritis, Cancer of ascending colon ([place]) (02/15/2015), Constipation, chronic, COPD (chronic obstructive pulmonary disease) ([place]), Depression, Diverticulosis, GERD (gastroesophageal reflux disease), Glaucoma, Hemorrhoids, History of hiatal hernia, Hyperlipidemia, Hypertension, Insomnia, Keratosis, actinic, Leg cramps, Macular degeneration, Neuromuscular disorder ([place]), On aspirin at home, PONV (postoperative nausea and vomiting), Restless leg syndrome, Schatzki's ring (1987), Skin cancer, and Urinary incontinence.    PAST SURGICAL HISTORY: Past Surgical History:  Procedure Laterality Date  . ABDOMINAL HYSTERECTOMY    . BLADDER REPAIR    . CARDIAC CATHETERIZATION  2007, 2009  . CATARACT EXTRACTION Bilateral   . CHOLECYSTECTOMY    . COLECTOMY    . FINGER NAIL SURGERY    . FOOT SURGERY Right   . HEMORROIDECTOMY    . INNER EAR SURGERY Right    3-4 times  . INTRAOPERATIVE ARTERIOGRAM     right arm  . KNEE SURGERY Right   . LEG SURGERY Bilateral    laser ablation  . MASTOID DEBRIDEMENT     x2  . SKIN CANCER EXCISION     right/left thumb nail, left face, right ear/neck, right leg  . TOE FUSION    . TONSILLECTOMY      FAMILY HISTORY: family history is not on file.  SOCIAL HISTORY:  reports that she has quit smoking. She has never used smokeless tobacco. She reports that she does not drink alcohol or use drugs.  ALLERGIES: Azithromycin; Allopurinol; Cephalexin; Codeine; Gabapentin; Niaspan [niacin]; Oxybutynin; Paxil [paroxetine hcl]; Penicillins; Pentazocine; Tizanidine; Tramadol; and Vicodin [hydrocodone-acetaminophen]  MEDICATIONS:  No current facility-administered medications for this encounter.    No current outpatient medications on file.   Facility-Administered  Medications Ordered in Other Encounters  Medication Dose Route Frequency Provider Last Rate Last Dose  . acetaminophen (TYLENOL) tablet 650 mg  650 mg Oral Q6H PRN Hollice Gong, Mir Earlie Server, MD       Or  . acetaminophen (TYLENOL) suppository 650 mg  650 mg Rectal Q6H PRN Hollice Gong, Mir Earlie Server, MD      . aspirin EC tablet 81 mg  81 mg Oral q morning - 10a Hollice Gong, Mir Mohammed, MD   81 mg at 07/14/18 0930  . cyclobenzaprine (FLEXERIL) tablet 5 mg  5 mg Oral TID Tomma Rakers, MD   5 mg at 07/14/18 1700  . dexamethasone (DECADRON) tablet 4 mg  4 mg Oral BID Ladell Pier, MD   4 mg at 07/14/18 0930  . docusate sodium (COLACE) capsule 100 mg  100 mg Oral BID Hollice Gong, Mir Mohammed, MD   100 mg at 07/14/18 0930  . enoxaparin (LOVENOX) injection 40 mg  40 mg Subcutaneous Q24H Berton Mount, RPH   40 mg at 07/14/18 0930  .  febuxostat (ULORIC) tablet 40 mg  40 mg Oral Daily Hollice Gong, Mir Mohammed, MD   40 mg at 07/14/18 0930  . furosemide (LASIX) tablet 20 mg  20 mg Oral Daily Hollice Gong, Mir Mohammed, MD   20 mg at 07/14/18 0930  . gemfibrozil (LOPID) tablet 600 mg  600 mg Oral q morning - 10a Hollice Gong, Mir Mohammed, MD   600 mg at 07/14/18 0930  . Glycerin-Hypromellose-PEG 400 0.2-0.2-1 % SOLN 6 drop  6 drop Both Eyes Daily Tomma Rakers, MD   6 drop at 07/13/18 [phone]  . hydrALAZINE (APRESOLINE) injection 5 mg  5 mg Intravenous Q4H PRN Lovey Newcomer T, NP   5 mg at 07/13/18 2130  . hydrocortisone 1 % lotion   Topical QID PRN Dessa Phi, DO      . loratadine (CLARITIN) tablet 10 mg  10 mg Oral Daily PRN Hollice Gong, Mir Mohammed, MD      . meloxicam [facility]) tablet 15 mg  15 mg Oral Daily Hollice Gong, Mir Mohammed, MD   15 mg at 07/14/18 0930  . mirtazapine (REMERON) tablet 7.5 mg  7.5 mg Oral QHS Hollice Gong, Mir Mohammed, MD   7.5 mg at 07/13/18 2347  . morphine 2 MG/ML injection 2 mg  2 mg Intravenous Q4H PRN Dessa Phi, DO   2 mg at 07/14/18 1310  .  multivitamin (PROSIGHT) tablet 1 tablet  1 tablet Oral Daily Tomma Rakers, MD   1 tablet at 07/14/18 0930  . ondansetron (ZOFRAN) tablet 4 mg  4 mg Oral Q6H PRN Hollice Gong, Mir Mohammed, MD       Or  . ondansetron [facility]) injection 4 mg  4 mg Intravenous Q6H PRN Hollice Gong, Mir Mohammed, MD      . oxyCODONE-acetaminophen (PERCOCET/ROXICET) 5-325 MG per tablet 1 tablet  1 tablet Oral Q4H PRN Tomma Rakers, MD   1 tablet at 07/14/18 0827  . pantoprazole (PROTONIX) EC tablet 40 mg  40 mg Oral Daily Hollice Gong, Mir Mohammed, MD   40 mg at 07/14/18 0930  . polyethylene glycol (MIRALAX / GLYCOLAX) packet 17 g  17 g Oral Daily PRN Hollice Gong, Mir Mohammed, MD      . potassium chloride (K-DUR,KLOR-CON) CR tablet 10 mEq  10 mEq Oral Daily Hollice Gong, Mir Mohammed, MD   10 mEq at 07/14/18 0930  . promethazine (PHENERGAN) tablet 12.5 mg  12.5 mg Oral Q8H PRN Hollice Gong, Mir Mohammed, MD      . vitamin B-12 (CYANOCOBALAMIN) tablet 1,000 mcg  1,000 mcg Oral q morning - 10a Hollice Gong, Mir Earlie Server, MD   1,000 mcg at 07/14/18 0930    REVIEW OF SYSTEMS:  REVIEW OF SYSTEMS: A 10+ POINT REVIEW OF SYSTEMS WAS OBTAINED including neurology, dermatology, psychiatry, cardiac, respiratory, lymph, extremities, GI, GU, musculoskeletal, constitutional, reproductive, HEENT. All pertinent positives are noted in the HPI. All others are negative.   PHYSICAL EXAM:  General: Alert and oriented, in no acute distress, lying in her hospital bed HEENT: Head is normocephalic. Extraocular movements are intact. Oropharynx is clear. Neck: Neck is supple, no palpable cervical or supraclavicular lymphadenopathy. Heart: Regular in rate and rhythm with no murmurs, rubs, or gallops. Chest: Clear to auscultation bilaterally, with no rhonchi, wheezes, or rales. Abdomen: Soft, nontender, nondistended, with no rigidity or guarding. She has laparoscopic surgical scars.  Extremities: No cyanosis or edema. Lymphatics: see  Neck Exam Skin: No concerning lesions. She has gauze on mid back, covering biopsy site. Musculoskeletal: symmetric strength and muscle tone throughout. Can raise both  legs easily off the hospital bed Neurologic: Cranial nerves II through XII are grossly intact. No obvious focalities. Speech is fluent. Coordination is intact. Psychiatric: Judgment and insight are intact. Affect is appropriate.   ECOG = 3  0 - Asymptomatic (Fully active, able to carry on all predisease activities without restriction)  1 - Symptomatic but completely ambulatory (Restricted in physically strenuous activity but ambulatory and able to carry out work of a light or sedentary nature. For example, light housework, office work)  2 - Symptomatic, <50% in bed during the day (Ambulatory and capable of all self care but unable to carry out any work activities. Up and about more than 50% of waking hours)  3 - Symptomatic, >50% in bed, but not bedbound (Capable of only limited self-care, confined to bed or chair 50% or more of waking hours)  4 - Bedbound (Completely disabled. Cannot carry on any self-care. Totally confined to bed or chair)  5 - Death   Eustace Pen MM, Creech RH, Tormey DC, et al. [phone]). "Toxicity and response criteria of the [facility] Group". [place] Oncol. 5 (6): 649-55  LABORATORY DATA:  Lab Results  Component Value Date   WBC 6.0 07/14/2018   HGB 9.8 (L) 07/14/2018   HCT 27.8 (L) 07/14/2018   MCV 93.9 07/14/2018   PLT 326 07/14/2018   NEUTROABS 6.4 07/12/2018   Lab Results  Component Value Date   NA 140 07/14/2018   K 4.4 07/14/2018   CL 100 07/14/2018   CO2 31 07/14/2018   GLUCOSE 112 (H) 07/14/2018   CREATININE 1.22 (H) 07/14/2018   CALCIUM 9.9 07/14/2018      RADIOGRAPHY: Dg Chest 2 View  Result Date: 07/12/2018 CLINICAL DATA:  Chest mass EXAM: CHEST - 2 VIEW COMPARISON:  07/08/2018, bone scan 07/12/2018 FINDINGS: Low lung volume. No pleural effusion. Borderline  to mild cardiomegaly. No pneumothorax. Scarring within the anterior lung base. IMPRESSION: No active cardiopulmonary disease.  Borderline to mild cardiomegaly. Electronically Signed   By: Donavan Foil M.D.   On: 07/12/2018 19:41   Dg Ribs Unilateral W/chest Left  Result Date: 07/08/2018 CLINICAL DATA:  Pain after fall EXAM: LEFT RIBS AND CHEST - 3+ VIEW COMPARISON:  Chest x-ray October 02, 2016 FINDINGS: The heart, hila, mediastinum, lungs, and pleura are normal. No acute rib fractures noted. No pneumothorax. IMPRESSION: Negative. Electronically Signed   By: Dorise Bullion III M.D   On: 07/08/2018 18:04   Dg Lumbar Spine Complete  Result Date: 07/08/2018 CLINICAL DATA:  Pain after fall EXAM: LUMBAR SPINE - COMPLETE 4+ VIEW COMPARISON:  CT scan June 11, 2018 FINDINGS: Scoliotic curvature of the lumbar spine, apex to the right. No acute fractures are seen. IMPRESSION: Degenerative changes and scoliosis.  No acute fracture identified. Electronically Signed   By: Dorise Bullion III M.D   On: 07/08/2018 18:02   Nm Bone Scan Whole Body  Result Date: 07/12/2018 CLINICAL DATA:  Chronic BILATERAL low back pain with sciatica, history colon cancer EXAM: NUCLEAR MEDICINE WHOLE BODY BONE SCAN TECHNIQUE: Whole body anterior and posterior images were obtained approximately 3 hours after intravenous injection of radiopharmaceutical. RADIOPHARMACEUTICALS:  20.2 mCi Technetium-90mMDP IV COMPARISON:  None Radiographic correlation: CT abdomen pelvis 07/12/2018 FINDINGS: Motion artifacts at head/shoulders. Uptake at the shoulders, knees and feet typically degenerative. Small focus of abnormal increased tracer localization is seen at the posterior RIGHT eleventh rib with adjacent areas of slightly decreased tracer localization at the medial posterior RIGHT tenth and  eleventh ribs. These correspond to a pathologic fracture of the RIGHT eleventh rib and to a large posterior chest wall destructive soft tissue mass seen on CT  which extends into the RIGHT paraspinal muscles. No additional sites of abnormal osseous tracer accumulation are identified. Expected urinary tract and soft tissue distribution of tracer. IMPRESSION: Foci of decreased uptake in the posterior medial RIGHT tenth and eleventh ribs with additional uptake at a pathologic fracture of the posterior RIGHT eleventh rib, all at a site of a destructive chest wall soft tissue mass identified on CT. No additional scintigraphic abnormalities identified. Electronically Signed   By: Lavonia Dana M.D.   On: 07/12/2018 17:49   Ct Abdomen Pelvis W Contrast  Result Date: 07/12/2018 CLINICAL DATA:  Back pain and BILATERAL hip pain after 3 falls in the past week, worsening abdominal and back pain, blunt abdominal trauma, stable, history COPD, colon cancer, hypertension, former smoker EXAM: CT ABDOMEN AND PELVIS WITH CONTRAST TECHNIQUE: Multidetector CT imaging of the abdomen and pelvis was performed using the standard protocol following bolus administration of intravenous contrast. Sagittal and coronal MPR images reconstructed from axial data set. Additional reconstructed images of the CONTRAST:  31m ISOVUE-300 IOPAMIDOL (ISOVUE-300) INJECTION 61% IV. No oral contrast. COMPARISON:  06/11/2018 FINDINGS: Lower chest: Subsegmental atelectasis BILATERAL lower lobes Hepatobiliary: Gallbladder surgically absent.  Liver unremarkable. Pancreas: Atrophic without mass Spleen: Calcified granuloma.  Otherwise normal appearance. Adrenals/Urinary Tract: Adrenal glands unremarkable. Mass in LEFT perinephric space lateral to mid LEFT kidney measuring 3.0 x 2.3 x 3.0 cm in size, increased since 06/11/2018. This abuts the cortex of the LEFT kidney but appears to arise within the adjacent perinephric space rather than renal parenchyma. Kidneys, ureters, and bladder otherwise normal appearance. Stomach/Bowel: Prior RIGHT hemicolectomy. Mild sigmoid diverticulosis. Stomach and bowel loops otherwise  normal appearance. Vascular/Lymphatic: Atherosclerotic calcifications aorta without aneurysm. No adenopathy. Reproductive: Uterus surgically absent. Nonvisualization of ovaries. Other: No free air or free fluid. No acute inflammatory process. No hernia. Musculoskeletal: Subcutaneous nodule anterior LEFT mid abdomen, 2.2 x 1.4 x 1.7 cm increased since prior exam question subcutaneous metastasis. Large to soft tissue mass identified at the RIGHT paraspinal region extending from T8-L2 measuring 10.5 x 6.1 x 8.3 cm in size consistent with a large chest wall metastasis. Associated destruction of the posterior RIGHT tenth and eleventh ribs with a pathologic fracture of the posterior eleventh rib. Mass involves the RIGHT paraspinal muscles and traverses the midline into the medial LEFT paraspinal muscles. Pathologic fractures are also seen at the spinous processes of T8 and T9 with additional bone destruction at the posterior elements of RIGHT T9, T10 and T11. Mass extends into the RIGHT T10-T11 neural foramen. IMPRESSION: Large destructive chest wall mass posterior RIGHT paraspinal at T10-L2 measuring 10.5 x 6.1 x 8.3 cm in size, with associated bone destruction involving the RIGHT posterior elements of T9, T10 and T11, spinous processes of T8 and T9, and the posterior RIGHT tenth and eleventh ribs as above. Probable subcutaneous metastasis anterior LEFT abdominal wall 2.2 cm greatest size. Enlarging mass in the LEFT perinephric space 3.0 cm in size, question metastasis versus renal tumor. Electronically Signed   By: MLavonia DanaM.D.   On: 07/12/2018 18:17   Ct L-spine No Charge  Result Date: 07/12/2018 CLINICAL DATA:  Back pain and bilateral hip pain. Multiple recent falls. EXAM: CT LUMBAR SPINE WITHOUT CONTRAST TECHNIQUE: Multidetector CT imaging of the lumbar spine was performed without intravenous contrast administration. Multiplanar CT image reconstructions were also generated.  COMPARISON:  None. FINDINGS:  Segmentation: 5 lumbar type vertebrae. Alignment: Left convex scoliosis apexes at L4. Vertebrae: There is a soft tissue mass centered in the right paraspinous musculature at the T10-T11 level, extending inferiorly as far as the L1 level. The mass is predominantly hyperdense and has increased in size over time. There is associated destruction of the right transverse process of T11 and T12. Paraspinal and other soft tissues: Please refer to dedicated CT abdomen pelvis report. Disc levels: T11-T12: Suspected encroachment on the right neural foramen by the paraspinous mass. No other stenosis. T12-L1: Disc vacuum phenomenon without stenosis. L1-L2: Disc vacuum phenomenon with moderate left foraminal stenosis. L2-L3: Disc space narrowing with severe left foraminal stenosis. L3-L4: Disc space narrowing with prominent endplate osteophytes. Severe bilateral neural foraminal stenosis. Mild spinal canal stenosis. L4-L5: Disc space narrowing with endplate osteophytes. Severe right and moderate left foraminal stenosis. L5-S1: Disc space narrowing with moderate facet hypertrophy. Severe right and mild left foraminal stenosis. IMPRESSION: 1. Paraspinous soft tissue mass on the right at the lower thoracic spine, likely metastatic disease. This causes destruction of the right T11 and 12 transverse processes and may encroach on the right T11 neural foramen. 2. Please refer to dedicated report for concomitant CT abdomen pelvis for soft tissue findings. 3. Multilevel severe neural foraminal stenosis. Electronically Signed   By: Ulyses Jarred M.D.   On: 07/12/2018 18:10   Ct Biopsy  Result Date: 07/13/2018 INDICATION: 82 year old female with a destructive mass centered in the right paraspinal musculature. She presents for CT-guided biopsy of the same. She has a history of colon cancer. EXAM: CT BIOPSY MEDICATIONS: None. ANESTHESIA/SEDATION: Moderate (conscious) sedation was employed during this procedure. A total of Versed 1 mg and  Fentanyl 50 mcg was administered intravenously. Moderate Sedation Time: 10 minutes. The patient's level of consciousness and vital signs were monitored continuously by radiology nursing throughout the procedure under my direct supervision. FLUOROSCOPY TIME:  Fluoroscopy Time: 0 minutes 0 seconds (0 mGy). COMPLICATIONS: None immediate. PROCEDURE: Informed written consent was obtained from the patient after a thorough discussion of the procedural risks, benefits and alternatives. All questions were addressed. Maximal Sterile Barrier Technique was utilized including caps, mask, sterile gowns, sterile gloves, sterile drape, hand hygiene and skin antiseptic. A timeout was performed prior to the initiation of the procedure. A planning axial CT scan was performed. The right paraspinal soft tissue mass was identified. The overlying skin was prepped and draped in standard fashion using chlorhexidine skin prep. Local anesthesia was attained by infiltration with 1% lidocaine. A small dermatotomy was made. Under intermittent CT guidance, a 17 gauge trocar needle was advanced into the periphery of the mass. Multiple 18 gauge core biopsies were then coaxially obtained using the bio Pince automated biopsy device. Biopsy specimens were placed in formalin and delivered to pathology for further analysis. IMPRESSION: Successful CT-guided biopsy of right paraspinal mass. Electronically Signed   By: Jacqulynn Cadet M.D.   On: 07/13/2018 14:38   Dg Hips Bilat W Or Wo Pelvis 2 Views  Result Date: 07/12/2018 CLINICAL DATA:  Bilateral hip pain after multiple falls. EXAM: DG HIP (WITH OR WITHOUT PELVIS) 2V BILAT COMPARISON:  None. FINDINGS: There is no evidence of hip fracture or dislocation. There is no evidence of arthropathy or other focal bone abnormality. IMPRESSION: Normal bilateral hips. Electronically Signed   By: Marijo Conception, M.D.   On: 07/12/2018 16:28   Dg Hip Unilat W Or Wo Pelvis 2-3 Views Left  Result Date:  07/08/2018 CLINICAL DATA:  Pain after fall. EXAM: DG HIP (WITH OR WITHOUT PELVIS) 2-3V LEFT COMPARISON:  None. FINDINGS: There is no evidence of hip fracture or dislocation. There is no evidence of arthropathy or other focal bone abnormality. IMPRESSION: Negative. Electronically Signed   By: Dorise Bullion III M.D   On: 07/08/2018 18:00   Dg Hip Unilat With Pelvis 2-3 Views Left  Result Date: 07/07/2018 CLINICAL DATA:  Fall at home today. Weakness and difficulty walking for the past few months. Bilateral hip pain, worse on the left. EXAM: DG HIP (WITH OR WITHOUT PELVIS) 2-3V RIGHT; DG HIP (WITH OR WITHOUT PELVIS) 2-3V LEFT COMPARISON:  CT abdomen and pelvis 06/11/2018 FINDINGS: Single view of the pelvis including the hips with two views of both hips obtained. Degenerative changes in the lower lumbar spine and in both hips. Diffuse bone demineralization. No evidence of acute fracture or dislocation involving the pelvis, right hip, or left hip. SI joints and symphysis pubis are not displaced. Calcified phleboliths in pelvis. IMPRESSION: Degenerative changes in the hips. No acute displaced fractures identified. Electronically Signed   By: Lucienne Capers M.D.   On: 07/07/2018 22:20   Dg Hip Unilat  With Pelvis 2-3 Views Right  Result Date: 07/07/2018 CLINICAL DATA:  Fall at home today. Weakness and difficulty walking for the past few months. Bilateral hip pain, worse on the left. EXAM: DG HIP (WITH OR WITHOUT PELVIS) 2-3V RIGHT; DG HIP (WITH OR WITHOUT PELVIS) 2-3V LEFT COMPARISON:  CT abdomen and pelvis 06/11/2018 FINDINGS: Single view of the pelvis including the hips with two views of both hips obtained. Degenerative changes in the lower lumbar spine and in both hips. Diffuse bone demineralization. No evidence of acute fracture or dislocation involving the pelvis, right hip, or left hip. SI joints and symphysis pubis are not displaced. Calcified phleboliths in pelvis. IMPRESSION: Degenerative changes in the  hips. No acute displaced fractures identified. Electronically Signed   By: Lucienne Capers M.D.   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 Special stains are pending at this time. Patient is having severe back pain relating to to this  paraspinal mass. I would therefore recommend a short course of palliative radiation therapy directed at the large paraspinal mass. Today, I talked to the patient and about the findings and work-up thus far.  We discussed the natural history of colon cancer and general treatment, highlighting the role of radiotherapy in the management.  We discussed the available radiation techniques, and focused on the details of logistics and delivery.  We reviewed the anticipated acute and late sequelae associated with radiation in this setting.  The patient was encouraged to ask questions that I answered to the best of my ability.  A patient consent form was discussed and signed.  We retained a copy for our records.  The patient would like to proceed with radiation and will be scheduled for CT simulation today.  PLAN: The patient will proceed with first radiation treatment later today.      ------------------------------------------------  Blair Promise, PhD, MD  This document serves as a record of services personally performed by Gery Pray, MD. It was created on his behalf by Wilburn Mylar, a trained medical scribe. The creation of this record is based on the scribe's personal observations and the provider's statements to them. This document has  been checked and approved by the attending provider.

## 2018-07-14 NOTE — Progress Notes (Addendum)
IP PROGRESS NOTE  Subjective:   Erica Escobar is known to me with a history of colon cancer and recent back pain. She presented to the emergency room on 3 occasions over the past week with back pain and falls.  She presented to the emergency room 07/12/2018 and was admitted for further evaluation.  A CT of the abdomen and pelvis revealed a mass in the left perinephric space measuring 3 x 2.3 x 3 cm.  A subcutaneous nodule at the left anterior abdomen measured 2.2 x 1.4 x 1.7 cm.  A soft tissue mass is noted at the right paraspinal region extending from T8-L2.  There is destruction of the right 10th and 11th ribs with a pathologic fracture of the posterior 11th rib.  The mass involves the right paraspinous muscles and crosses the midline to the left paraspinous muscles.  Pathologic fractures at the spinous processes of T8 and T9.  The mass extends into the T10-T11 neural foramen.  She reports persistent back pain.  She is constipated, but reports no difficulty with bowel or bladder control.  She complains of diffuse back and abdominal pain. Objective: Vital signs in last 24 hours: Blood pressure (!) 150/73, pulse 85, temperature 98.1 F (36.7 C), temperature source Oral, resp. rate 16, height 5' 3"  (1.6 m), weight 151 lb 14.4 oz (68.9 kg), SpO2 96 %.  Intake/Output from previous day: 07/31 0701 - 08/01 0700 In: 890.5 [P.O.:360; I.V.:480.5; IV Piggyback:50] Out: -   Physical Exam:  HEENT: No thrush Lungs: Clear bilaterally, no respiratory distress Cardiac: Regular rate and rhythm Abdomen: No hepatosplenomegaly, no mass Extremities: No leg edema Neurologic: Alert, follows commands, the motor exam appears intact in the upper and lower extremities bilaterally.  Slightly confused    Lab Results: Recent Labs    07/13/18 0439 07/14/18 0358  WBC 6.3 6.0  HGB 10.7* 9.8*  HCT 30.2* 27.8*  PLT 370 326    BMET Recent Labs    07/13/18 0439 07/14/18 0358  NA 141 140  K 3.5 4.4  CL 97*  100  CO2 33* 31  GLUCOSE 101* 112*  BUN 19 22  CREATININE 1.27* 1.22*  CALCIUM 10.8* 9.9    Lab Results  Component Value Date   CEA1 1.75 06/28/2018    Studies/Results: Dg Chest 2 View  Result Date: 07/12/2018 CLINICAL DATA:  Chest mass EXAM: CHEST - 2 VIEW COMPARISON:  07/08/2018, bone scan 07/12/2018 FINDINGS: Low lung volume. No pleural effusion. Borderline to mild cardiomegaly. No pneumothorax. Scarring within the anterior lung base. IMPRESSION: No active cardiopulmonary disease.  Borderline to mild cardiomegaly. Electronically Signed   By: Donavan Foil M.D.   On: 07/12/2018 19:41   Nm Bone Scan Whole Body  Result Date: 07/12/2018 CLINICAL DATA:  Chronic BILATERAL low back pain with sciatica, history colon cancer EXAM: NUCLEAR MEDICINE WHOLE BODY BONE SCAN TECHNIQUE: Whole body anterior and posterior images were obtained approximately 3 hours after intravenous injection of radiopharmaceutical. RADIOPHARMACEUTICALS:  20.2 mCi Technetium-41mMDP IV COMPARISON:  None Radiographic correlation: CT abdomen pelvis 07/12/2018 FINDINGS: Motion artifacts at head/shoulders. Uptake at the shoulders, knees and feet typically degenerative. Small focus of abnormal increased tracer localization is seen at the posterior RIGHT eleventh rib with adjacent areas of slightly decreased tracer localization at the medial posterior RIGHT tenth and eleventh ribs. These correspond to a pathologic fracture of the RIGHT eleventh rib and to a large posterior chest wall destructive soft tissue mass seen on CT which extends into the RIGHT paraspinal  muscles. No additional sites of abnormal osseous tracer accumulation are identified. Expected urinary tract and soft tissue distribution of tracer. IMPRESSION: Foci of decreased uptake in the posterior medial RIGHT tenth and eleventh ribs with additional uptake at a pathologic fracture of the posterior RIGHT eleventh rib, all at a site of a destructive chest wall soft tissue  mass identified on CT. No additional scintigraphic abnormalities identified. Electronically Signed   By: Lavonia Dana M.D.   On: 07/12/2018 17:49   Ct Abdomen Pelvis W Contrast  Result Date: 07/12/2018 CLINICAL DATA:  Back pain and BILATERAL hip pain after 3 falls in the past week, worsening abdominal and back pain, blunt abdominal trauma, stable, history COPD, colon cancer, hypertension, former smoker EXAM: CT ABDOMEN AND PELVIS WITH CONTRAST TECHNIQUE: Multidetector CT imaging of the abdomen and pelvis was performed using the standard protocol following bolus administration of intravenous contrast. Sagittal and coronal MPR images reconstructed from axial data set. Additional reconstructed images of the CONTRAST:  81m ISOVUE-300 IOPAMIDOL (ISOVUE-300) INJECTION 61% IV. No oral contrast. COMPARISON:  06/11/2018 FINDINGS: Lower chest: Subsegmental atelectasis BILATERAL lower lobes Hepatobiliary: Gallbladder surgically absent.  Liver unremarkable. Pancreas: Atrophic without mass Spleen: Calcified granuloma.  Otherwise normal appearance. Adrenals/Urinary Tract: Adrenal glands unremarkable. Mass in LEFT perinephric space lateral to mid LEFT kidney measuring 3.0 x 2.3 x 3.0 cm in size, increased since 06/11/2018. This abuts the cortex of the LEFT kidney but appears to arise within the adjacent perinephric space rather than renal parenchyma. Kidneys, ureters, and bladder otherwise normal appearance. Stomach/Bowel: Prior RIGHT hemicolectomy. Mild sigmoid diverticulosis. Stomach and bowel loops otherwise normal appearance. Vascular/Lymphatic: Atherosclerotic calcifications aorta without aneurysm. No adenopathy. Reproductive: Uterus surgically absent. Nonvisualization of ovaries. Other: No free air or free fluid. No acute inflammatory process. No hernia. Musculoskeletal: Subcutaneous nodule anterior LEFT mid abdomen, 2.2 x 1.4 x 1.7 cm increased since prior exam question subcutaneous metastasis. Large to soft tissue mass  identified at the RIGHT paraspinal region extending from T8-L2 measuring 10.5 x 6.1 x 8.3 cm in size consistent with a large chest wall metastasis. Associated destruction of the posterior RIGHT tenth and eleventh ribs with a pathologic fracture of the posterior eleventh rib. Mass involves the RIGHT paraspinal muscles and traverses the midline into the medial LEFT paraspinal muscles. Pathologic fractures are also seen at the spinous processes of T8 and T9 with additional bone destruction at the posterior elements of RIGHT T9, T10 and T11. Mass extends into the RIGHT T10-T11 neural foramen. IMPRESSION: Large destructive chest wall mass posterior RIGHT paraspinal at T10-L2 measuring 10.5 x 6.1 x 8.3 cm in size, with associated bone destruction involving the RIGHT posterior elements of T9, T10 and T11, spinous processes of T8 and T9, and the posterior RIGHT tenth and eleventh ribs as above. Probable subcutaneous metastasis anterior LEFT abdominal wall 2.2 cm greatest size. Enlarging mass in the LEFT perinephric space 3.0 cm in size, question metastasis versus renal tumor. Electronically Signed   By: MLavonia DanaM.D.   On: 07/12/2018 18:17   Ct L-spine No Charge  Result Date: 07/12/2018 CLINICAL DATA:  Back pain and bilateral hip pain. Multiple recent falls. EXAM: CT LUMBAR SPINE WITHOUT CONTRAST TECHNIQUE: Multidetector CT imaging of the lumbar spine was performed without intravenous contrast administration. Multiplanar CT image reconstructions were also generated. COMPARISON:  None. FINDINGS: Segmentation: 5 lumbar type vertebrae. Alignment: Left convex scoliosis apexes at L4. Vertebrae: There is a soft tissue mass centered in the right paraspinous musculature at the T10-T11 level,  extending inferiorly as far as the L1 level. The mass is predominantly hyperdense and has increased in size over time. There is associated destruction of the right transverse process of T11 and T12. Paraspinal and other soft tissues:  Please refer to dedicated CT abdomen pelvis report. Disc levels: T11-T12: Suspected encroachment on the right neural foramen by the paraspinous mass. No other stenosis. T12-L1: Disc vacuum phenomenon without stenosis. L1-L2: Disc vacuum phenomenon with moderate left foraminal stenosis. L2-L3: Disc space narrowing with severe left foraminal stenosis. L3-L4: Disc space narrowing with prominent endplate osteophytes. Severe bilateral neural foraminal stenosis. Mild spinal canal stenosis. L4-L5: Disc space narrowing with endplate osteophytes. Severe right and moderate left foraminal stenosis. L5-S1: Disc space narrowing with moderate facet hypertrophy. Severe right and mild left foraminal stenosis. IMPRESSION: 1. Paraspinous soft tissue mass on the right at the lower thoracic spine, likely metastatic disease. This causes destruction of the right T11 and 12 transverse processes and may encroach on the right T11 neural foramen. 2. Please refer to dedicated report for concomitant CT abdomen pelvis for soft tissue findings. 3. Multilevel severe neural foraminal stenosis. Electronically Signed   By: Ulyses Jarred M.D.   On: 07/12/2018 18:10   Ct Biopsy  Result Date: 07/13/2018 INDICATION: 82 year old female with a destructive mass centered in the right paraspinal musculature. She presents for CT-guided biopsy of the same. She has a history of colon cancer. EXAM: CT BIOPSY MEDICATIONS: None. ANESTHESIA/SEDATION: Moderate (conscious) sedation was employed during this procedure. A total of Versed 1 mg and Fentanyl 50 mcg was administered intravenously. Moderate Sedation Time: 10 minutes. The patient's level of consciousness and vital signs were monitored continuously by radiology nursing throughout the procedure under my direct supervision. FLUOROSCOPY TIME:  Fluoroscopy Time: 0 minutes 0 seconds (0 mGy). COMPLICATIONS: None immediate. PROCEDURE: Informed written consent was obtained from the patient after a thorough  discussion of the procedural risks, benefits and alternatives. All questions were addressed. Maximal Sterile Barrier Technique was utilized including caps, mask, sterile gowns, sterile gloves, sterile drape, hand hygiene and skin antiseptic. A timeout was performed prior to the initiation of the procedure. A planning axial CT scan was performed. The right paraspinal soft tissue mass was identified. The overlying skin was prepped and draped in standard fashion using chlorhexidine skin prep. Local anesthesia was attained by infiltration with 1% lidocaine. A small dermatotomy was made. Under intermittent CT guidance, a 17 gauge trocar needle was advanced into the periphery of the mass. Multiple 18 gauge core biopsies were then coaxially obtained using the bio Pince automated biopsy device. Biopsy specimens were placed in formalin and delivered to pathology for further analysis. IMPRESSION: Successful CT-guided biopsy of right paraspinal mass. Electronically Signed   By: Jacqulynn Cadet M.D.   On: 07/13/2018 14:38   Dg Hips Bilat W Or Wo Pelvis 2 Views  Result Date: 07/12/2018 CLINICAL DATA:  Bilateral hip pain after multiple falls. EXAM: DG HIP (WITH OR WITHOUT PELVIS) 2V BILAT COMPARISON:  None. FINDINGS: There is no evidence of hip fracture or dislocation. There is no evidence of arthropathy or other focal bone abnormality. IMPRESSION: Normal bilateral hips. Electronically Signed   By: Marijo Conception, M.D.   On: 07/12/2018 16:28    Medications: I have reviewed the patient's current medications.  Assessment/Plan: 1. 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 Loss of MLH1 and PMS2, MSI-high, BRAF mutation detected ? Colonoscopy February 2017 ? Colonoscopy  12/16/2017-3 mm polyp in the transverse colon, 10 mm polyp in the sigmoid colon. Diverticulosis in the sigmoid colon. Transverse colon polyp, tubular adenoma; sigmoid colon  polyp, tubular adenoma. ? CT 07/12/2018- probable subcutaneous metastasis at the left abdominal wall, left perinephric mass, destructive thoracic/lumbar paraspinous mass ? CT-guided biopsy of the paraspinous mass 07/13/2018  2. Anemia-likely secondary to bleeding from the colon cancer and surgery. Hemoglobin in normal range 03/20/2016, mild persistent anemia 3. Ascending colon "polyps" removed on the colonoscopy 12/22/2014 with the pathology revealing poorly differentiated adenocarcinoma and a sessile serrated adenoma polyp, similar to the pathology on the hepatic flexure mass 4. History of multiple skin cancers. Seen by dermatology every 3 months. 5. Glaucoma 6.  Severe back pain- CT 07/12/2018- large destructive right paraspinal chest wall mass lower thoracic/upper lumbar spine 7.  Anorexia/weight loss 8.  Mild elevation of calcium- hypercalcemia of malignancy?   Ms. Kahan is admitted with severe back pain, multiple falls, and failure to thrive.  The admission CT confirmed a large destructive paraspinous mass.  This is responsible for her pain.  She appears to have metastatic disease involving the abdominal cavity.  She most likely has metastatic colon cancer.  We will follow-up on the results from the 07/13/2018 biopsy.  The original tumor had a MSI high phenotype suggesting a high chance of clinical improvement with a PD1 inhibitor.  I will recommend treatment with pembrolizumab if a diagnosis of metastatic colon cancer is confirmed.  She may benefit from palliative radiation to the destructive paraspinous mass.  She does not appear to have a cord compression syndrome at present.  I will consult radiation oncology today.  Recommendations: 1.  Follow-up pathology, plan for outpatient pembrolizumab if a diagnosis of metastatic colon cancer is confirmed 2.  Radiation oncology consult to consider palliative radiation to the thoracic/lumbar paraspinous mass 3.  Continue narcotic analgesics as  needed for pain 4.  Physical therapy as tolerated 5.  Discontinue calcium supplement, monitor calcium level, consider biphosphonate therapy if persistently elevated 6.  Begin trial of Decadron, plan to taper rapidly prior to beginning pembrolizumab  Please call oncology as needed.  I will schedule outpatient follow-up for the week of 07/18/2018.  Ned Card, nurse practitioner will check on her 07/15/2018.  I will be out until 07/18/2018.  I will contact pathology to discuss the 07/13/2018 biopsy today.  I discussed the case with Dr. Sondra Come.  Radiation oncology will see her today.  LOS: 2 days   Betsy Coder, MD   07/14/2018, 7:20 AM

## 2018-07-14 NOTE — Progress Notes (Signed)
CM consult requested. Per PT eval pt has been recommended for SNF. If pt declines SNF placement then CM will offer choice for home health services. CM will continue to follow and assist as needed. Marney Doctor RN,BSN,NCM [phone]

## 2018-07-15 ENCOUNTER — Other Ambulatory Visit: Payer: Medicare Other

## 2018-07-15 ENCOUNTER — Ambulatory Visit
Admit: 2018-07-15 | Discharge: 2018-07-15 | Disposition: A | Discharge status: Home / Self Care | Payer: Medicare Other | Attending: Radiation Oncology | Admitting: Radiation Oncology

## 2018-07-15 LAB — BASIC METABOLIC PANEL
Anion gap: 11 (ref 5–15)
BUN: 23 mg/dL (ref 8–23)
CALCIUM: 10 mg/dL (ref 8.9–10.3)
CHLORIDE: 98 mmol/L (ref 98–111)
CO2: 30 mmol/L (ref 22–32)
CREATININE: 1.34 mg/dL — AB (ref 0.44–1.00)
GFR calc non Af Amer: 37 mL/min — ABNORMAL LOW (ref >60)
GFR, EST AFRICAN AMERICAN: 42 mL/min — AB (ref >60)
GLUCOSE: 139 mg/dL — AB (ref 70–99)
Potassium: 4.4 mmol/L (ref 3.5–5.1)
Sodium: 139 mmol/L (ref 135–145)

## 2018-07-15 LAB — CBC
HEMATOCRIT: 29.2 % — AB (ref 36.0–46.0)
Hemoglobin: 10 g/dL — ABNORMAL LOW (ref 12.0–15.0)
MCH: 32.8 pg (ref 26.0–34.0)
MCHC: 34.2 g/dL (ref 30.0–36.0)
MCV: 95.7 fL (ref 78.0–100.0)
Platelets: 345 10*3/uL (ref 150–400)
RBC: 3.05 MIL/uL — ABNORMAL LOW (ref 3.87–5.11)
RDW: 13.4 % (ref 11.5–15.5)
WBC: 6.8 10*3/uL (ref 4.0–10.5)

## 2018-07-15 MED ORDER — OXYCODONE-ACETAMINOPHEN 5-325 MG PO TABS: 1.0000 | ORAL_TABLET | ORAL | 0 refills | Status: AC | PRN

## 2018-07-15 MED ORDER — DEXAMETHASONE 4 MG PO TABS: 4.0000 mg | ORAL_TABLET | Freq: Two times a day (BID) | ORAL | 0 refills | Status: DC

## 2018-07-15 MED ORDER — SENNOSIDES-DOCUSATE SODIUM 8.6-50 MG PO TABS
1.0000 | ORAL_TABLET | Freq: Two times a day (BID) | ORAL | Status: DC
  Administered 2018-07-15 – 2018-07-16 (×2): 1 via ORAL
  Filled 2018-07-15 (×3): qty 1

## 2018-07-15 NOTE — Clinical Social Work Note (Signed)
Clinical Social Work Assessment  Patient Details  Name: Erica Escobar MRN: [id number] Date of Birth: [birth year]  Date of referral:  07/15/18               Reason for consult:                   Permission sought to share information with:  Facility Sport and exercise psychologist, Case Optician, dispensing granted to share information::  Yes, Verbal Permission Granted  Name::        Agency::  SNF  Relationship::   Son/ Daughter in Financial trader Information:     Housing/Transportation Living arrangements for the past 2 months:  Single Family Home Source of Information:  Patient Patient Interpreter Needed:  None Criminal Activity/Legal Involvement Pertinent to Current Situation/Hospitalization:  No - Comment as needed Significant Relationships:  Adult Children Lives with:  Self Do you feel safe going back to the place where you live?  No Need for family participation in patient care:  No (Coment)  Care giving concerns:   Admitted with acute on chronic back pain found to have large posterior chest wall mass concerning for metastatic disease. PT recommends SNF for rehab.   Social Worker assessment / plan: CSW met with patient and daughter in law to discuss discharge planning to SNF. Patient agreeable. Patient is currently receiving radiation treatments. She will continue to need treatment after discharge. CSW explain to patient family SNF process and followed up with bed offers. CSW explain the patient will need transportation to the [facility].  The family agreeable to [facility]. The facility states they will be able to provide transportation. Patient express she is ready to transition back home after regaining her strength.    FL2 completed.  PASRR done. [id number]  Plan: SNF/ Insurance authorization pending.   Employment status:  Retired Nurse, adult PT Recommendations:  [place] / Referral to community resources:  [place]  Patient/Family's Response to care:  Agreeable and Responding well to care.   Patient/Family's Understanding of and Emotional Response to Diagnosis, Current Treatment, and Prognosis:  Patient has a basic understanding of diagnosis and is often very forgetful. Patient son and daughter in law have knowledge of patient current diagnosis and learning about treatment.   Emotional Assessment Appearance:  Appears stated age Attitude/Demeanor/Rapport:    Affect (typically observed):  Accepting, Pleasant Orientation:  Oriented to Self, Oriented to Place, Oriented to  Time, Oriented to Situation Alcohol / Substance use:  Not Applicable Psych involvement (Current and /or in the community):  No (Comment)  Discharge Needs  Concerns to be addressed:  Discharge Planning Concerns Readmission within the last 30 days:  No Current discharge risk:  Dependent with Mobility Barriers to Discharge:  Continued Medical Work up, [place], [place] 07/15/2018, 10:09 AM

## 2018-07-15 NOTE — Progress Notes (Signed)
PROGRESS NOTE    Erica Escobar  XFG:[id number] DOB: 03-19-1936 DOA: 07/12/2018 PCP: Deland Pretty, MD     Brief Narrative:  Erica Escobar is a 82 y.o. female with medical history significant for poorly differentiated colon adenocarcinoma s/p treatment in 2016 as well as chronic back pain who is being admitted with acute on chronic back pain found to have large posterior chest wall mass concerning for metastatic disease. The history is provided by the patient who says she had worsening of her chronic back pain about 2 weeks ago. Pain is in the low back and radiates across the whole back and into her left hip. She had had normal appetite but lost about 30 pounds unintentionally in the last year. Her oncologist is Dr. Benay Spice and she has had regular followup with the Oncology team with no evidence of disease recurrence thus far. In the ER, CT imaging was performed and showed evidence of a large soft tissue mass in the posterior chest wall with related bony destruction and rib fractures as detailed below. Also a perinephric mass and left anterior abdominal wall mass. She was admitted for further work up and treatment.  She underwent CT-guided biopsy of right paraspinal mass on 7/31.  New events last 24 hours / Subjective: Wants to get out of bed today. Pain well controlled.   Assessment & Plan:   Principal Problem:   Metastatic cancer ([place]) Active Problems:   Cancer of ascending colon s/p robotic colectomy 02/15/2015   Restless leg syndrome   Depression   GERD (gastroesophageal reflux disease)   Chronic back pain   Pathologic rib fracture, initial encounter   Chest wall mass  Concern for metastatic disease -CT A/P revealed large soft tissue chest wall mass posterior paraspinal with associated bone destruction right posterior T9-T11, spinous process T8-T9, posterior right rib 10, 11, subcutaneous mets anterior left abdominal wall, enlarging mass left perinephric space  -PET with uptake at  pathologic fx posterior right rib 11 and site of destructive chest wall soft tissue mass -CEA 2.2  -Status post CT-guided biopsy of right paraspinal mass 7/31, pathology revealed poorly differentiated malignant neoplasm, awaiting immunohistochemical stain for further characterization of tumor  -Appreciate Dr. Benay Spice consultation -Started radiation tx #2/10 today   Hx poorly differentiated adenocarcinoma of ascending colon cancer s/p right colectomy 2016 -Follows with Dr. Benay Spice   Acute on chronic back pain -Due to #1 -Pain control  Falls at home -PT eval, recommending SNF   CKD stage 3 -Baseline Cr 1.3-1.5 -Stable Cr 1.34 today   Chronic diastolic HF -Without acute exacerbation, continue home lasix    DVT prophylaxis: Lovenox Code Status: Full Family Communication: No family at bedside Disposition Plan: Continue radiation tx, awaiting pathology result, discharge to SNF when placement available    Consultants:   IR  Oncology  Radiation oncology   Procedures:   Status post CT-guided biopsy of right paraspinal mass 7/31  Antimicrobials:  Anti-infectives (From admission, onward)   None       Objective: Vitals:   07/14/18 0645 07/14/18 1413 07/14/18 2155 07/15/18 0532  BP: (!) 150/73 (!) 141/78 123/67 134/68  Pulse:   96 88  Resp:  16 18 16   Temp:  98.4 F (36.9 C) 98.5 F (36.9 C) 98.4 F (36.9 C)  TempSrc:  Oral Oral Oral  SpO2:   94% 96%  Weight:      Height:        Intake/Output Summary (Last 24 hours) at 07/15/2018 1244 Last  data filed at 07/15/2018 0736 Gross per 24 hour  Intake 200 ml  Output 900 ml  Net -700 ml   Filed Weights   07/12/18 1338 07/12/18 2327  Weight: 68.9 kg (152 lb) 68.9 kg (151 lb 14.4 oz)    Examination: General exam: Appears calm and comfortable  Respiratory system: Clear to auscultation. Respiratory effort normal. Cardiovascular system: S1 & S2 heard, RRR. No JVD, murmurs, rubs, gallops or clicks. No pedal  edema. Gastrointestinal system: Abdomen is nondistended, soft and nontender. No organomegaly or masses felt. Normal bowel sounds heard. Central nervous system: Alert and oriented. No focal neurological deficits. Extremities: Symmetric 5 x 5 power. Skin: No rashes, lesions or ulcers Psychiatry: Judgement and insight appear normal. Mood & affect appropriate.    Data Reviewed: I have personally reviewed following labs and imaging studies  CBC: Recent Labs  Lab 07/08/18 1606 07/12/18 1530 07/21/18 0439 07/14/18 0358 07/15/18 0356  WBC 6.4 8.1 6.3 6.0 6.8  NEUTROABS 4.7 6.4  --   --   --   HGB 10.7* 12.2 10.7* 9.8* 10.0*  HCT 29.6* 34.3* 30.2* 27.8* 29.2*  MCV 93.7 93.5 93.8 93.9 95.7  PLT 347 368 370 326 448   Basic Metabolic Panel: Recent Labs  Lab 07/08/18 1606 07/12/18 1530 07/12/18 1957 July 21, 2018 0439 07/14/18 0358 07/15/18 0356  NA 140 142  --  141 140 139  K 3.4* 3.3*  --  3.5 4.4 4.4  CL 102 95*  --  97* 100 98  CO2 28 31  --  33* 31 30  GLUCOSE 103* 100*  --  101* 112* 139*  BUN 24* 21  --  19 22 23   CREATININE 1.48* 1.29*  --  1.27* 1.22* 1.34*  CALCIUM 9.8 11.3*  --  10.8* 9.9 10.0  MG  --   --  1.5*  --  2.0  --    GFR: Estimated Creatinine Clearance: 31.7 mL/min (A) (by C-G formula based on SCr of 1.34 mg/dL (H)). Liver Function Tests: Recent Labs  Lab 07/12/18 1530 07-21-18 0439  AST 24 21  ALT 10 11  ALKPHOS 69 58  BILITOT 0.8 0.6  PROT 7.0 6.1*  ALBUMIN 3.4* 2.9*   Recent Labs  Lab 07/12/18 1530  LIPASE 27   No results for input(s): AMMONIA in the last 168 hours. Coagulation Profile: Recent Labs  Lab 07-21-2018 0910  INR 1.00   Cardiac Enzymes: No results for input(s): CKTOTAL, CKMB, CKMBINDEX, TROPONINI in the last 168 hours. BNP (last 3 results) No results for input(s): PROBNP in the last 8760 hours. HbA1C: No results for input(s): HGBA1C in the last 72 hours. CBG: No results for input(s): GLUCAP in the last 168 hours. Lipid  Profile: No results for input(s): CHOL, HDL, LDLCALC, TRIG, CHOLHDL, LDLDIRECT in the last 72 hours. Thyroid Function Tests: No results for input(s): TSH, T4TOTAL, FREET4, T3FREE, THYROIDAB in the last 72 hours. Anemia Panel: No results for input(s): VITAMINB12, FOLATE, FERRITIN, TIBC, IRON, RETICCTPCT in the last 72 hours. Sepsis Labs: No results for input(s): PROCALCITON, LATICACIDVEN in the last 168 hours.  No results found for this or any previous visit (from the past 240 hour(s)).     Radiology Studies: Ct Biopsy  Result Date: July 21, 2018 INDICATION: 82 year old female with a destructive mass centered in the right paraspinal musculature. She presents for CT-guided biopsy of the same. She has a history of colon cancer. EXAM: CT BIOPSY MEDICATIONS: None. ANESTHESIA/SEDATION: Moderate (conscious) sedation was employed during this procedure. A  total of Versed 1 mg and Fentanyl 50 mcg was administered intravenously. Moderate Sedation Time: 10 minutes. The patient's level of consciousness and vital signs were monitored continuously by radiology nursing throughout the procedure under my direct supervision. FLUOROSCOPY TIME:  Fluoroscopy Time: 0 minutes 0 seconds (0 mGy). COMPLICATIONS: None immediate. PROCEDURE: Informed written consent was obtained from the patient after a thorough discussion of the procedural risks, benefits and alternatives. All questions were addressed. Maximal Sterile Barrier Technique was utilized including caps, mask, sterile gowns, sterile gloves, sterile drape, hand hygiene and skin antiseptic. A timeout was performed prior to the initiation of the procedure. A planning axial CT scan was performed. The right paraspinal soft tissue mass was identified. The overlying skin was prepped and draped in standard fashion using chlorhexidine skin prep. Local anesthesia was attained by infiltration with 1% lidocaine. A small dermatotomy was made. Under intermittent CT guidance, a 17 gauge  trocar needle was advanced into the periphery of the mass. Multiple 18 gauge core biopsies were then coaxially obtained using the bio Pince automated biopsy device. Biopsy specimens were placed in formalin and delivered to pathology for further analysis. IMPRESSION: Successful CT-guided biopsy of right paraspinal mass. Electronically Signed   By: Jacqulynn Cadet M.D.   On: 07/13/2018 14:38      Scheduled Meds: . aspirin EC  81 mg Oral q morning - 10a  . cyclobenzaprine  5 mg Oral TID  . dexamethasone  4 mg Oral BID  . docusate sodium  100 mg Oral BID  . enoxaparin (LOVENOX) injection  40 mg Subcutaneous Q24H  . febuxostat  40 mg Oral Daily  . furosemide  20 mg Oral Daily  . gemfibrozil  600 mg Oral q morning - 10a  . Glycerin-Hypromellose-PEG 400  6 drop Both Eyes Daily  . meloxicam  15 mg Oral Daily  . mirtazapine  7.5 mg Oral QHS  . pantoprazole  40 mg Oral Daily  . potassium chloride  10 mEq Oral Daily  . vitamin B-12  1,000 mcg Oral q morning - 10a   Continuous Infusions:    LOS: 3 days    Time spent: 20 minutes   Dessa Phi, DO [facility] [email or web address] Password [facility] 07/15/2018, 12:44 PM

## 2018-07-15 NOTE — Progress Notes (Signed)
Physical Therapy Treatment Patient Details Name: Erica Escobar MRN: [id number] DOB: [birth year] Today's Date: 07/15/2018    History of Present Illness Erica Escobar is a 82 y.o. female with medical history significant for poorly differentiated colon adenocarcinoma, chronic back pain who is being admitted 07/12/18 with acute on chronic back pain.  CT imaging  showed evidence of a large soft tissue mass in the posterior chest wall with related bony destruction left 11th  rib fracture,  pathological fractures T8/9 ,, destruction of the right T11 and 12 transverse processes and may encroach on the right T11 neural foramen.     PT Comments    The patient  Presents with some difficulty with expression, word finding. Patient ambulated x 15' x 2 which was about the maximum pt. Can tolerate. Trunk more flexed.  patient asking if she has "cracked ribs". Defer  To family and MD for answer.  continue PT.    Follow Up Recommendations  SNF     Equipment Recommendations  None recommended by PT    Recommendations for Other Services       Precautions / Restrictions Precautions Precautions: Fall Precaution Comments: pathological fxs T spine, ribs    Mobility  Bed Mobility Overal bed mobility: Needs Assistance Bed Mobility: Supine to Sit     Supine to sit: Mod assist     General bed mobility comments: extra time to mobilize to bed edge, t . Patient able to sit upright.   Transfers Overall transfer level: Needs assistance Equipment used: Rolling walker (2 wheeled) Transfers: Sit to/from Stand Sit to Stand: Min assist         General transfer comment: Steady assist to rise from bed , and low toilet.  Ambulation/Gait Ambulation/Gait assistance: Mod assist Gait Distance (Feet): 15 Feet Assistive device: Rolling walker (2 wheeled) Gait Pattern/deviations: Step-to pattern;Staggering left;Staggering right;Trunk flexed Gait velocity: decr   General Gait Details: ambulated to BR with  more upright trunk posture. Return to Recliner with noted more trunk flexion, especially when wahing  at sink.    Stairs             Wheelchair Mobility    Modified Rankin (Stroke Patients Only)       Balance   Sitting-balance support: Feet supported;No upper extremity supported Sitting balance-Leahy Scale: Good     Standing balance support: During functional activity;Bilateral upper extremity supported Standing balance-Leahy Scale: Poor Standing balance comment: when moving with RW, requires steady assist, fatigue and pain.                            Cognition Arousal/Alertness: Awake/alert   Overall Cognitive Status: Impaired/Different from baseline Area of Impairment: Problem solving                             Problem Solving: Slow processing General Comments: patient noted with difficulty expressing self,  stops mid sentence, pt. states that she knows she cannot  say what she wants at times      Exercises      General Comments        Pertinent Vitals/Pain Pain Score: 10-Worst pain ever Pain Location: left posterior lower back, lower abdomen- constipated per pt. Pain Descriptors / Indicators: Aching;Cramping Pain Intervention(s): Monitored during session;Limited activity within patient's tolerance;Patient requesting pain meds-RN notified    Home Living  Prior Function            PT Goals (current goals can now be found in the care plan section) Progress towards PT goals: Progressing toward goals    Frequency    Min 2X/week      PT Plan Current plan remains appropriate    Co-evaluation              AM-PAC PT "6 Clicks" Daily Activity  Outcome Measure  Difficulty turning over in bed (including adjusting bedclothes, sheets and blankets)?: A Lot Difficulty moving from lying on back to sitting on the side of the bed? : A Lot Difficulty sitting down on and standing up from a chair  with arms (e.g., wheelchair, bedside commode, etc,.)?: A Lot Help needed moving to and from a bed to chair (including a wheelchair)?: Total Help needed walking in hospital room?: Total Help needed climbing 3-5 steps with a railing? : Total 6 Click Score: 9    End of Session Equipment Utilized During Treatment: Gait belt Activity Tolerance: Patient limited by pain Patient left: in chair;with call bell/phone within reach;with chair alarm set;with family/visitor present Nurse Communication: Mobility status PT Visit Diagnosis: Unsteadiness on feet (R26.81);Pain Pain - Right/Left: Left Pain - part of body: Hip     Time: 6606-3016 PT Time Calculation (min) (ACUTE ONLY): 21 min  Charges:  $Gait Training: 8-22 mins                     [place] [facility] [phone]    Erica Escobar 07/15/2018, 5:16 PM

## 2018-07-15 NOTE — Care Management Important Message (Signed)
Important Message  Patient Details  Name: Erica Escobar MRN: [id number] Date of Birth: 1936/10/28   Medicare Important Message Given:  Yes    [name] 07/15/2018, 10:26 AMImportant Message  Patient Details  Name: Erica Escobar MRN: [id number] Date of Birth: 04/24/1936   Medicare Important Message Given:  Yes    [name] 07/15/2018, 10:24 AM

## 2018-07-15 NOTE — NC FL2 (Signed)
[place] LEVEL OF CARE SCREENING TOOL     IDENTIFICATION  Patient Name: Erica Escobar Birthdate: 04-09-1936 Sex: female Admission Date (Current Location): 07/12/2018  [facility] and [place] Number:  Herbalist and Address:  [facility] [place],  [place], [place]      Provider Number: [id number]  Attending Physician Name and Address:  Dessa Phi, DO  Relative Name and Phone Number:       Current Level of Care: Hospital Recommended Level of Care: [place] Prior Approval Number:    Date Approved/Denied:   PASRR Number:    Discharge Plan: SNF    Current Diagnoses: Patient Active Problem List   Diagnosis Date Noted  . Metastatic cancer ([place]) 07/12/2018  . Chronic back pain 07/12/2018  . Pathologic rib fracture, initial encounter 07/12/2018  . Chest wall mass 07/12/2018  . Cancer of ascending colon s/p robotic colectomy 02/15/2015 02/15/2015  . Restless leg syndrome   . Depression   . Insomnia   . COPD (chronic obstructive pulmonary disease) ([place])   . GERD (gastroesophageal reflux disease)     Orientation RESPIRATION BLADDER Height & Weight     Self, Time, Situation, Place  Normal Continent Weight: 68.9 kg (151 lb 14.4 oz) Height:  5' 3"  (160 cm)  BEHAVIORAL SYMPTOMS/MOOD NEUROLOGICAL BOWEL NUTRITION STATUS      Continent Diet(Regular)  AMBULATORY STATUS COMMUNICATION OF NEEDS Skin   Extensive Assist Verbally Normal                       Personal Care Assistance Level of Assistance  Bathing, Feeding, Dressing Bathing Assistance: Limited assistance Feeding assistance: Independent Dressing Assistance: Limited assistance     Functional Limitations Info  Sight, Hearing, Speech Sight Info: Impaired Hearing Info: Adequate Speech Info: Adequate    SPECIAL CARE FACTORS FREQUENCY  PT (By licensed PT), OT (By licensed OT)     PT Frequency: 5x/week OT Frequency: 5x/week             Contractures Contractures Info: Not present    Additional Factors Info  Code Status, Allergies, Psychotropic Code Status Info: Fullcode Allergies Info: Azithromycin, Allopurinol, Cephalexin, Codeine, Gabapentin, Niaspan Niacin, Oxybutynin, Paxil Paroxetine Hcl, Penicillins, Pentazocine, Tizanidine, Tramadol, Vicodin Hydrocodone-acetaminophen Psychotropic Info: remeron         Current Medications (07/15/2018):  This is the current hospital active medication list Current Facility-Administered Medications  Medication Dose Route Frequency Provider Last Rate Last Dose  . acetaminophen (TYLENOL) tablet 650 mg  650 mg Oral Q6H PRN Hollice Gong, Mir Earlie Server, MD       Or  . acetaminophen (TYLENOL) suppository 650 mg  650 mg Rectal Q6H PRN Hollice Gong, Mir Earlie Server, MD      . aspirin EC tablet 81 mg  81 mg Oral q morning - 10a Hollice Gong, Mir Mohammed, MD   81 mg at 07/15/18 0850  . cyclobenzaprine (FLEXERIL) tablet 5 mg  5 mg Oral TID Tomma Rakers, MD   5 mg at 07/15/18 0849  . dexamethasone (DECADRON) tablet 4 mg  4 mg Oral BID Ladell Pier, MD   4 mg at 07/15/18 0849  . docusate sodium (COLACE) capsule 100 mg  100 mg Oral BID Tomma Rakers, MD   100 mg at 07/15/18 0849  . enoxaparin (LOVENOX) injection 40 mg  40 mg Subcutaneous Q24H Berton Mount, RPH   40 mg at 07/15/18 0850  . febuxostat (ULORIC) tablet 40 mg  40 mg Oral Daily Hollice Gong, Mir Mohammed, MD   40 mg at 07/15/18 0850  . furosemide (LASIX) tablet 20 mg  20 mg Oral Daily Hollice Gong, Mir Mohammed, MD   20 mg at 07/15/18 0850  . gemfibrozil (LOPID) tablet 600 mg  600 mg Oral q morning - 10a Hollice Gong, Mir Mohammed, MD   600 mg at 07/15/18 0850  . Glycerin-Hypromellose-PEG 400 0.2-0.2-1 % SOLN 6 drop  6 drop Both Eyes Daily Tomma Rakers, MD   6 drop at 07/15/18 0851  . hydrALAZINE (APRESOLINE) injection 5 mg  5 mg Intravenous Q4H PRN Lovey Newcomer T, NP   5 mg at 07/13/18 2130  .  hydrocortisone 1 % lotion   Topical QID PRN Dessa Phi, DO      . loratadine (CLARITIN) tablet 10 mg  10 mg Oral Daily PRN Hollice Gong, Mir Mohammed, MD      . meloxicam [facility]) tablet 15 mg  15 mg Oral Daily Hollice Gong, Mir Mohammed, MD   15 mg at 07/15/18 0850  . mirtazapine (REMERON) tablet 7.5 mg  7.5 mg Oral QHS Hollice Gong, Mir Mohammed, MD   7.5 mg at 07/14/18 2206  . morphine 2 MG/ML injection 2 mg  2 mg Intravenous Q4H PRN Dessa Phi, DO   2 mg at 07/14/18 1310  . ondansetron (ZOFRAN) tablet 4 mg  4 mg Oral Q6H PRN Hollice Gong, Mir Mohammed, MD       Or  . ondansetron [facility] [place]) injection 4 mg  4 mg Intravenous Q6H PRN Hollice Gong, Mir Mohammed, MD      . oxyCODONE-acetaminophen (PERCOCET/ROXICET) 5-325 MG per tablet 1 tablet  1 tablet Oral Q4H PRN Tomma Rakers, MD   1 tablet at 07/15/18 [phone]  . pantoprazole (PROTONIX) EC tablet 40 mg  40 mg Oral Daily Hollice Gong, Mir Mohammed, MD   40 mg at 07/15/18 0849  . polyethylene glycol (MIRALAX / GLYCOLAX) packet 17 g  17 g Oral Daily PRN Tomma Rakers, MD   17 g at 07/14/18 2204  . potassium chloride (K-DUR,KLOR-CON) CR tablet 10 mEq  10 mEq Oral Daily Hollice Gong, Mir Mohammed, MD   10 mEq at 07/15/18 0849  . promethazine (PHENERGAN) tablet 12.5 mg  12.5 mg Oral Q8H PRN Hollice Gong, Mir Mohammed, MD      . vitamin B-12 (CYANOCOBALAMIN) tablet 1,000 mcg  1,000 mcg Oral q morning - 10a Hollice Gong, Mir Earlie Server, MD   1,000 mcg at 07/15/18 1443     Discharge Medications: Please see discharge summary for a list of discharge medications.  Relevant Imaging Results:  Relevant Lab Results:   Additional Information [id number]  Lia Hopping, LCSW

## 2018-07-15 NOTE — Progress Notes (Signed)
[facility] Therapy Treatment Record Phone [phone]   Radiation Therapy was administered to Erica Escobar on: 07/15/2018  10:22 AM and was treatment # 2 out of a planned course of 10 treatments.  Radiation Treatment  1). Beam photons with 6-10 energy  2). Brachytherapy None  3). Stereotactic Radiosurgery None  4). Other Radiation None     Bennett Scrape, RT (T)

## 2018-07-16 DIAGNOSIS — C8330 Diffuse large B-cell lymphoma, unspecified site: Secondary | ICD-10-CM | POA: Diagnosis not present

## 2018-07-16 DIAGNOSIS — Z5309 Procedure and treatment not carried out because of other contraindication: Secondary | ICD-10-CM | POA: Diagnosis not present

## 2018-07-16 DIAGNOSIS — B370 Candidal stomatitis: Secondary | ICD-10-CM | POA: Diagnosis not present

## 2018-07-16 DIAGNOSIS — D6481 Anemia due to antineoplastic chemotherapy: Secondary | ICD-10-CM | POA: Diagnosis not present

## 2018-07-16 DIAGNOSIS — M549 Dorsalgia, unspecified: Secondary | ICD-10-CM | POA: Diagnosis not present

## 2018-07-16 DIAGNOSIS — L98499 Non-pressure chronic ulcer of skin of other sites with unspecified severity: Secondary | ICD-10-CM | POA: Diagnosis not present

## 2018-07-16 DIAGNOSIS — M2550 Pain in unspecified joint: Secondary | ICD-10-CM | POA: Diagnosis not present

## 2018-07-16 DIAGNOSIS — Z5111 Encounter for antineoplastic chemotherapy: Secondary | ICD-10-CM | POA: Diagnosis not present

## 2018-07-16 DIAGNOSIS — I129 Hypertensive chronic kidney disease with stage 1 through stage 4 chronic kidney disease, or unspecified chronic kidney disease: Secondary | ICD-10-CM | POA: Diagnosis not present

## 2018-07-16 DIAGNOSIS — C8580 Other specified types of non-Hodgkin lymphoma, unspecified site: Secondary | ICD-10-CM | POA: Diagnosis not present

## 2018-07-16 DIAGNOSIS — C799 Secondary malignant neoplasm of unspecified site: Secondary | ICD-10-CM | POA: Diagnosis not present

## 2018-07-16 DIAGNOSIS — D701 Agranulocytosis secondary to cancer chemotherapy: Secondary | ICD-10-CM | POA: Diagnosis not present

## 2018-07-16 DIAGNOSIS — C7951 Secondary malignant neoplasm of bone: Secondary | ICD-10-CM | POA: Diagnosis not present

## 2018-07-16 DIAGNOSIS — M8448XA Pathological fracture, other site, initial encounter for fracture: Secondary | ICD-10-CM | POA: Diagnosis not present

## 2018-07-16 DIAGNOSIS — Z452 Encounter for adjustment and management of vascular access device: Secondary | ICD-10-CM | POA: Diagnosis not present

## 2018-07-16 DIAGNOSIS — Z9889 Other specified postprocedural states: Secondary | ICD-10-CM | POA: Diagnosis not present

## 2018-07-16 DIAGNOSIS — C8339 Diffuse large B-cell lymphoma, extranodal and solid organ sites: Secondary | ICD-10-CM | POA: Diagnosis not present

## 2018-07-16 DIAGNOSIS — Z5112 Encounter for antineoplastic immunotherapy: Secondary | ICD-10-CM | POA: Diagnosis not present

## 2018-07-16 DIAGNOSIS — C7889 Secondary malignant neoplasm of other digestive organs: Secondary | ICD-10-CM | POA: Diagnosis not present

## 2018-07-16 DIAGNOSIS — D649 Anemia, unspecified: Secondary | ICD-10-CM | POA: Diagnosis not present

## 2018-07-16 DIAGNOSIS — N183 Chronic kidney disease, stage 3 (moderate): Secondary | ICD-10-CM | POA: Diagnosis not present

## 2018-07-16 DIAGNOSIS — Z85828 Personal history of other malignant neoplasm of skin: Secondary | ICD-10-CM | POA: Diagnosis not present

## 2018-07-16 DIAGNOSIS — Z7401 Bed confinement status: Secondary | ICD-10-CM | POA: Diagnosis not present

## 2018-07-16 DIAGNOSIS — I5032 Chronic diastolic (congestive) heart failure: Secondary | ICD-10-CM | POA: Diagnosis not present

## 2018-07-16 DIAGNOSIS — R222 Localized swelling, mass and lump, trunk: Secondary | ICD-10-CM | POA: Diagnosis not present

## 2018-07-16 DIAGNOSIS — M546 Pain in thoracic spine: Secondary | ICD-10-CM | POA: Diagnosis not present

## 2018-07-16 DIAGNOSIS — Z9181 History of falling: Secondary | ICD-10-CM | POA: Diagnosis not present

## 2018-07-16 DIAGNOSIS — C182 Malignant neoplasm of ascending colon: Secondary | ICD-10-CM | POA: Diagnosis not present

## 2018-07-16 DIAGNOSIS — D72829 Elevated white blood cell count, unspecified: Secondary | ICD-10-CM | POA: Diagnosis not present

## 2018-07-16 DIAGNOSIS — Z6825 Body mass index (BMI) 25.0-25.9, adult: Secondary | ICD-10-CM | POA: Diagnosis not present

## 2018-07-16 DIAGNOSIS — H409 Unspecified glaucoma: Secondary | ICD-10-CM | POA: Diagnosis not present

## 2018-07-16 DIAGNOSIS — R488 Other symbolic dysfunctions: Secondary | ICD-10-CM | POA: Diagnosis not present

## 2018-07-16 DIAGNOSIS — T380X5A Adverse effect of glucocorticoids and synthetic analogues, initial encounter: Secondary | ICD-10-CM | POA: Diagnosis not present

## 2018-07-16 DIAGNOSIS — E785 Hyperlipidemia, unspecified: Secondary | ICD-10-CM | POA: Diagnosis not present

## 2018-07-16 DIAGNOSIS — M8458XD Pathological fracture in neoplastic disease, other specified site, subsequent encounter for fracture with routine healing: Secondary | ICD-10-CM | POA: Diagnosis not present

## 2018-07-16 DIAGNOSIS — R630 Anorexia: Secondary | ICD-10-CM | POA: Diagnosis not present

## 2018-07-16 DIAGNOSIS — M6281 Muscle weakness (generalized): Secondary | ICD-10-CM | POA: Diagnosis not present

## 2018-07-16 DIAGNOSIS — R2689 Other abnormalities of gait and mobility: Secondary | ICD-10-CM | POA: Diagnosis not present

## 2018-07-16 DIAGNOSIS — J449 Chronic obstructive pulmonary disease, unspecified: Secondary | ICD-10-CM | POA: Diagnosis not present

## 2018-07-16 DIAGNOSIS — Z86010 Personal history of colonic polyps: Secondary | ICD-10-CM | POA: Diagnosis not present

## 2018-07-16 LAB — CBC
HEMATOCRIT: 28.1 % — AB (ref 36.0–46.0)
Hemoglobin: 9.7 g/dL — ABNORMAL LOW (ref 12.0–15.0)
MCH: 33.4 pg (ref 26.0–34.0)
MCHC: 34.5 g/dL (ref 30.0–36.0)
MCV: 96.9 fL (ref 78.0–100.0)
Platelets: 373 10*3/uL (ref 150–400)
RBC: 2.9 MIL/uL — ABNORMAL LOW (ref 3.87–5.11)
RDW: 13.5 % (ref 11.5–15.5)
WBC: 11.1 10*3/uL — ABNORMAL HIGH (ref 4.0–10.5)

## 2018-07-16 LAB — BASIC METABOLIC PANEL
Anion gap: 13 (ref 5–15)
BUN: 34 mg/dL — AB (ref 8–23)
CALCIUM: 10 mg/dL (ref 8.9–10.3)
CO2: 32 mmol/L (ref 22–32)
CREATININE: 1.42 mg/dL — AB (ref 0.44–1.00)
Chloride: 97 mmol/L — ABNORMAL LOW (ref 98–111)
GFR calc non Af Amer: 34 mL/min — ABNORMAL LOW (ref >60)
GFR, EST AFRICAN AMERICAN: 40 mL/min — AB (ref >60)
Glucose, Bld: 120 mg/dL — ABNORMAL HIGH (ref 70–99)
Potassium: 4.4 mmol/L (ref 3.5–5.1)
SODIUM: 142 mmol/L (ref 135–145)

## 2018-07-16 MED ORDER — BISACODYL 10 MG RE SUPP
10.0000 mg | Freq: Once | RECTAL | Status: AC
  Administered 2018-07-16: 10 mg via RECTAL
  Filled 2018-07-16: qty 1

## 2018-07-16 NOTE — Discharge Summary (Addendum)
Physician Discharge Summary  Erica Escobar:[id number] DOB: 1936-09-28 DOA: 07/12/2018  PCP: Deland Pretty, MD  Admit date: 07/12/2018 Discharge date: 07/16/2018  Admitted From: Home Disposition:  SNF  Recommendations for Outpatient Follow-up:  1. Follow up with PCP in 1 week 2. Follow up with Dr. Benay Spice next week regarding final biopsy pathology results, treatment plan 3. Continue radiation tx as scheduled   Discharge Condition: Stable CODE STATUS: Full  Diet recommendation: Regular diet   Brief/Interim Summary: Erica L Colemanis a 82 y.o.femalewith medical history significant forpoorly differentiated colon adenocarcinoma s/p treatment in 2016 as well as chronic back pain who is being admitted with acute on chronic back pain found to have large posterior chest wall mass concerning for metastatic disease. The history is provided by the patient who says she had worsening of her chronic back pain about 2 weeks ago. Pain is in the low back and radiates across the whole back and into her left hip. She had had normal appetite but lost about 30 pounds unintentionally in the last year. Her oncologist is Dr. Benay Spice and she has had regular followup with the Oncology team with no evidence of disease recurrence thus far. In the ER, CT imaging was performed and showed evidence of a large soft tissue mass in the posterior chest wall with related bony destruction and rib fractures as detailed below. Also a perinephric mass and left anterior abdominal wall mass. She was admitted for further work up and treatment.  She underwent CT-guided biopsy of right paraspinal mass on 7/31; pathology revealed poorly differentiated malignant neoplasm, awaiting immunohistochemical stain for further characterization of tumor. She was started on radiation treatment while in hospital.   Discharge Diagnoses:  Principal Problem:   Metastatic cancer [facility]) Active Problems:   Cancer of ascending colon s/p robotic  colectomy 02/15/2015   Restless leg syndrome   Depression   GERD (gastroesophageal reflux disease)   Chronic back pain   Pathologic rib fracture, initial encounter   Chest wall mass  Concern for metastatic disease -CT A/P revealed large soft tissue chest wall mass posterior paraspinal with associated bone destruction right posterior T9-T11, spinous process T8-T9, posterior right rib 10, 11, subcutaneous mets anterior left abdominal wall, enlarging mass left perinephric space  -PET with uptake at pathologic fx posterior right rib 11 and site of destructive chest wall soft tissue mass -CEA 2.2  -Status post CT-guided biopsy of right paraspinal mass 7/31, pathology revealed poorly differentiated malignant neoplasm, awaiting immunohistochemical stain for further characterization of tumor  -Appreciate Dr. Benay Spice consultation, will follow up with him in office next week  -Will undergo 10 radiation tx   Hx poorly differentiated adenocarcinoma of ascending colon cancer s/p right colectomy 2016 -Follows with Dr. Benay Spice   Acute on chronic back pain -Due to #1 -Pain control, pain improved   Falls at home -PT eval, recommending SNF   CKD stage 3 -Baseline Cr 1.3-1.5 -Stable Cr today    Chronic diastolic HF -Without acute exacerbation, continue home lasix     Discharge Instructions  Discharge Instructions    Diet general   Complete by:  As directed    Increase activity slowly   Complete by:  As directed      Allergies as of 07/16/2018      Reactions   Azithromycin Itching   Allopurinol    Cephalexin    Codeine Itching   Gabapentin Swelling   Throat and leg swelling   Niaspan [niacin]    Oxybutynin Other (  See Comments)   "made me go crazy"-- had crazy dreams.    Paxil [paroxetine Hcl]    Unknown reaction   Penicillins Itching   Has patient had a PCN reaction causing immediate rash, facial/tongue/throat swelling, SOB or lightheadedness with hypotension: No Has patient  had a PCN reaction causing severe rash involving mucus membranes or skin necrosis: Yes Has patient had a PCN reaction that required hospitalization: No Has patient had a PCN reaction occurring within the last 10 years: No If all of the above answers are "NO", then may proceed with Cephalosporin use.   Pentazocine Itching, Nausea Only   Headache.    Tizanidine Other (See Comments)   "made me crazy"   Tramadol    Head feels "swimmy" and weakness   Vicodin [hydrocodone-acetaminophen] Other (See Comments)   "made me go crazy"      Medication List    STOP taking these medications   acetaminophen 500 MG tablet Commonly known as:  TYLENOL   calcium-vitamin D 250-125 MG-UNIT tablet Commonly known as:  OSCAL   multivitamin with minerals Tabs tablet     TAKE these medications   aspirin EC 81 MG tablet Take 81 mg by mouth every morning.   beta carotene w/minerals tablet Take 1 tablet by mouth daily.   cyclobenzaprine 5 MG tablet Commonly known as:  FLEXERIL Take 5 mg by mouth 3 (three) times daily.   dexamethasone 4 MG tablet Commonly known as:  DECADRON Take 1 tablet (4 mg total) by mouth 2 (two) times daily.   FISH OIL PO Take 2 capsules by mouth daily with breakfast.   furosemide 40 MG tablet Commonly known as:  LASIX Take 20 mg by mouth daily.   gemfibrozil 600 MG tablet Commonly known as:  LOPID Take 600 mg by mouth every morning.   loratadine 10 MG tablet Commonly known as:  CLARITIN Take 10 mg by mouth daily as needed for allergies.   meloxicam 15 MG tablet Commonly known as:  MOBIC Take 15 mg by mouth daily.   mirtazapine 15 MG tablet Commonly known as:  REMERON Take 7.5 mg by mouth at bedtime.   oxyCODONE-acetaminophen 5-325 MG tablet Commonly known as:  PERCOCET/ROXICET Take 1-2 tablets by mouth every 4 (four) hours as needed for up to 3 days for severe pain. What changed:  how much to take   pantoprazole 40 MG tablet Commonly known as:   PROTONIX Take 40 mg by mouth daily.   polyethylene glycol packet Commonly known as:  MIRALAX / GLYCOLAX Take 17 g by mouth daily as needed for moderate constipation.   potassium chloride 10 MEQ tablet Commonly known as:  K-DUR Take 10 mEq by mouth daily.   promethazine 25 MG tablet Commonly known as:  PHENERGAN Take 12.5 mg by mouth every 8 (eight) hours as needed for nausea or vomiting.   REFRESH 1.4-0.6 % Soln Generic drug:  Polyvinyl Alcohol-Povidone PF Place 6 drops into both eyes daily.   trolamine salicylate 10 % cream Commonly known as:  ASPERCREME Apply 1 application topically as needed for muscle pain (as needed for back pain).   ULORIC 40 MG tablet Generic drug:  febuxostat Take 40 mg by mouth daily.   VITAMIN B 12 PO Take 1,000 mg by mouth every morning.       Contact information for follow-up providers    Deland Pretty, MD. Schedule an appointment as soon as possible for a visit in 1 week(s).   Specialty:  Internal Medicine  Contact information: [address] [place] [address] [phone]        Ladell Pier, MD. Call.   Specialty:  Oncology Contact information: [place] [address] [phone]            Contact information for after-discharge care    Destination    [facility] Preferred SNF .   Service:  Skilled Nursing Contact information: [place] [phone]                 Allergies  Allergen Reactions  . Azithromycin Itching  . Allopurinol   . Cephalexin   . Codeine Itching  . Gabapentin Swelling    Throat and leg swelling  . Niaspan [Niacin]   . Oxybutynin Other (See Comments)    "made me go crazy"-- had crazy dreams.   . Paxil [Paroxetine Hcl]     Unknown reaction  . Penicillins Itching    Has patient had a PCN reaction causing immediate rash, facial/tongue/throat swelling, SOB or lightheadedness with  hypotension: No Has patient had a PCN reaction causing severe rash involving mucus membranes or skin necrosis: Yes Has patient had a PCN reaction that required hospitalization: No Has patient had a PCN reaction occurring within the last 10 years: No If all of the above answers are "NO", then may proceed with Cephalosporin use.   Marland Kitchen Pentazocine Itching and Nausea Only    Headache.   . Tizanidine Other (See Comments)    "made me crazy"  . Tramadol     Head feels "swimmy" and weakness  . Vicodin [Hydrocodone-Acetaminophen] Other (See Comments)    "made me go crazy"    Consultations:  Oncology  IR    Procedures/Studies: Dg Chest 2 View  Result Date: 07/12/2018 CLINICAL DATA:  Chest mass EXAM: CHEST - 2 VIEW COMPARISON:  07/08/2018, bone scan 07/12/2018 FINDINGS: Low lung volume. No pleural effusion. Borderline to mild cardiomegaly. No pneumothorax. Scarring within the anterior lung base. IMPRESSION: No active cardiopulmonary disease.  Borderline to mild cardiomegaly. Electronically Signed   By: Donavan Foil M.D.   On: 07/12/2018 19:41   Dg Ribs Unilateral W/chest Left  Result Date: 07/08/2018 CLINICAL DATA:  Pain after fall EXAM: LEFT RIBS AND CHEST - 3+ VIEW COMPARISON:  Chest x-ray October 02, 2016 FINDINGS: The heart, hila, mediastinum, lungs, and pleura are normal. No acute rib fractures noted. No pneumothorax. IMPRESSION: Negative. Electronically Signed   By: Dorise Bullion III M.D   On: 07/08/2018 18:04   Dg Lumbar Spine Complete  Result Date: 07/08/2018 CLINICAL DATA:  Pain after fall EXAM: LUMBAR SPINE - COMPLETE 4+ VIEW COMPARISON:  CT scan June 11, 2018 FINDINGS: Scoliotic curvature of the lumbar spine, apex to the right. No acute fractures are seen. IMPRESSION: Degenerative changes and scoliosis.  No acute fracture identified. Electronically Signed   By: Dorise Bullion III M.D   On: 07/08/2018 18:02   Nm Bone Scan Whole Body  Result Date: 07/12/2018 CLINICAL DATA:   Chronic BILATERAL low back pain with sciatica, history colon cancer EXAM: NUCLEAR MEDICINE WHOLE BODY BONE SCAN TECHNIQUE: Whole body anterior and posterior images were obtained approximately 3 hours after intravenous injection of radiopharmaceutical. RADIOPHARMACEUTICALS:  20.2 mCi Technetium-3mMDP IV COMPARISON:  None Radiographic correlation: CT abdomen pelvis 07/12/2018 FINDINGS: Motion artifacts at head/shoulders. Uptake at the shoulders, knees and feet typically degenerative. Small focus of abnormal increased tracer localization is seen at the  posterior RIGHT eleventh rib with adjacent areas of slightly decreased tracer localization at the medial posterior RIGHT tenth and eleventh ribs. These correspond to a pathologic fracture of the RIGHT eleventh rib and to a large posterior chest wall destructive soft tissue mass seen on CT which extends into the RIGHT paraspinal muscles. No additional sites of abnormal osseous tracer accumulation are identified. Expected urinary tract and soft tissue distribution of tracer. IMPRESSION: Foci of decreased uptake in the posterior medial RIGHT tenth and eleventh ribs with additional uptake at a pathologic fracture of the posterior RIGHT eleventh rib, all at a site of a destructive chest wall soft tissue mass identified on CT. No additional scintigraphic abnormalities identified. Electronically Signed   By: Lavonia Dana M.D.   On: 07/12/2018 17:49   Ct Abdomen Pelvis W Contrast  Result Date: 07/12/2018 CLINICAL DATA:  Back pain and BILATERAL hip pain after 3 falls in the past week, worsening abdominal and back pain, blunt abdominal trauma, stable, history COPD, colon cancer, hypertension, former smoker EXAM: CT ABDOMEN AND PELVIS WITH CONTRAST TECHNIQUE: Multidetector CT imaging of the abdomen and pelvis was performed using the standard protocol following bolus administration of intravenous contrast. Sagittal and coronal MPR images reconstructed from axial data set.  Additional reconstructed images of the CONTRAST:  29m ISOVUE-300 IOPAMIDOL (ISOVUE-300) INJECTION 61% IV. No oral contrast. COMPARISON:  06/11/2018 FINDINGS: Lower chest: Subsegmental atelectasis BILATERAL lower lobes Hepatobiliary: Gallbladder surgically absent.  Liver unremarkable. Pancreas: Atrophic without mass Spleen: Calcified granuloma.  Otherwise normal appearance. Adrenals/Urinary Tract: Adrenal glands unremarkable. Mass in LEFT perinephric space lateral to mid LEFT kidney measuring 3.0 x 2.3 x 3.0 cm in size, increased since 06/11/2018. This abuts the cortex of the LEFT kidney but appears to arise within the adjacent perinephric space rather than renal parenchyma. Kidneys, ureters, and bladder otherwise normal appearance. Stomach/Bowel: Prior RIGHT hemicolectomy. Mild sigmoid diverticulosis. Stomach and bowel loops otherwise normal appearance. Vascular/Lymphatic: Atherosclerotic calcifications aorta without aneurysm. No adenopathy. Reproductive: Uterus surgically absent. Nonvisualization of ovaries. Other: No free air or free fluid. No acute inflammatory process. No hernia. Musculoskeletal: Subcutaneous nodule anterior LEFT mid abdomen, 2.2 x 1.4 x 1.7 cm increased since prior exam question subcutaneous metastasis. Large to soft tissue mass identified at the RIGHT paraspinal region extending from T8-L2 measuring 10.5 x 6.1 x 8.3 cm in size consistent with a large chest wall metastasis. Associated destruction of the posterior RIGHT tenth and eleventh ribs with a pathologic fracture of the posterior eleventh rib. Mass involves the RIGHT paraspinal muscles and traverses the midline into the medial LEFT paraspinal muscles. Pathologic fractures are also seen at the spinous processes of T8 and T9 with additional bone destruction at the posterior elements of RIGHT T9, T10 and T11. Mass extends into the RIGHT T10-T11 neural foramen. IMPRESSION: Large destructive chest wall mass posterior RIGHT paraspinal at  T10-L2 measuring 10.5 x 6.1 x 8.3 cm in size, with associated bone destruction involving the RIGHT posterior elements of T9, T10 and T11, spinous processes of T8 and T9, and the posterior RIGHT tenth and eleventh ribs as above. Probable subcutaneous metastasis anterior LEFT abdominal wall 2.2 cm greatest size. Enlarging mass in the LEFT perinephric space 3.0 cm in size, question metastasis versus renal tumor. Electronically Signed   By: MLavonia DanaM.D.   On: 07/12/2018 18:17   Ct L-spine No Charge  Result Date: 07/12/2018 CLINICAL DATA:  Back pain and bilateral hip pain. Multiple recent falls. EXAM: CT LUMBAR SPINE WITHOUT CONTRAST TECHNIQUE: Multidetector  CT imaging of the lumbar spine was performed without intravenous contrast administration. Multiplanar CT image reconstructions were also generated. COMPARISON:  None. FINDINGS: Segmentation: 5 lumbar type vertebrae. Alignment: Left convex scoliosis apexes at L4. Vertebrae: There is a soft tissue mass centered in the right paraspinous musculature at the T10-T11 level, extending inferiorly as far as the L1 level. The mass is predominantly hyperdense and has increased in size over time. There is associated destruction of the right transverse process of T11 and T12. Paraspinal and other soft tissues: Please refer to dedicated CT abdomen pelvis report. Disc levels: T11-T12: Suspected encroachment on the right neural foramen by the paraspinous mass. No other stenosis. T12-L1: Disc vacuum phenomenon without stenosis. L1-L2: Disc vacuum phenomenon with moderate left foraminal stenosis. L2-L3: Disc space narrowing with severe left foraminal stenosis. L3-L4: Disc space narrowing with prominent endplate osteophytes. Severe bilateral neural foraminal stenosis. Mild spinal canal stenosis. L4-L5: Disc space narrowing with endplate osteophytes. Severe right and moderate left foraminal stenosis. L5-S1: Disc space narrowing with moderate facet hypertrophy. Severe right and  mild left foraminal stenosis. IMPRESSION: 1. Paraspinous soft tissue mass on the right at the lower thoracic spine, likely metastatic disease. This causes destruction of the right T11 and 12 transverse processes and may encroach on the right T11 neural foramen. 2. Please refer to dedicated report for concomitant CT abdomen pelvis for soft tissue findings. 3. Multilevel severe neural foraminal stenosis. Electronically Signed   By: Ulyses Jarred M.D.   On: 07/12/2018 18:10   Ct Biopsy  Result Date: 07/13/2018 INDICATION: 82 year old female with a destructive mass centered in the right paraspinal musculature. She presents for CT-guided biopsy of the same. She has a history of colon cancer. EXAM: CT BIOPSY MEDICATIONS: None. ANESTHESIA/SEDATION: Moderate (conscious) sedation was employed during this procedure. A total of Versed 1 mg and Fentanyl 50 mcg was administered intravenously. Moderate Sedation Time: 10 minutes. The patient's level of consciousness and vital signs were monitored continuously by radiology nursing throughout the procedure under my direct supervision. FLUOROSCOPY TIME:  Fluoroscopy Time: 0 minutes 0 seconds (0 mGy). COMPLICATIONS: None immediate. PROCEDURE: Informed written consent was obtained from the patient after a thorough discussion of the procedural risks, benefits and alternatives. All questions were addressed. Maximal Sterile Barrier Technique was utilized including caps, mask, sterile gowns, sterile gloves, sterile drape, hand hygiene and skin antiseptic. A timeout was performed prior to the initiation of the procedure. A planning axial CT scan was performed. The right paraspinal soft tissue mass was identified. The overlying skin was prepped and draped in standard fashion using chlorhexidine skin prep. Local anesthesia was attained by infiltration with 1% lidocaine. A small dermatotomy was made. Under intermittent CT guidance, a 17 gauge trocar needle was advanced into the periphery  of the mass. Multiple 18 gauge core biopsies were then coaxially obtained using the bio Pince automated biopsy device. Biopsy specimens were placed in formalin and delivered to pathology for further analysis. IMPRESSION: Successful CT-guided biopsy of right paraspinal mass. Electronically Signed   By: Jacqulynn Cadet M.D.   On: 07/13/2018 14:38   Dg Hips Bilat W Or Wo Pelvis 2 Views  Result Date: 07/12/2018 CLINICAL DATA:  Bilateral hip pain after multiple falls. EXAM: DG HIP (WITH OR WITHOUT PELVIS) 2V BILAT COMPARISON:  None. FINDINGS: There is no evidence of hip fracture or dislocation. There is no evidence of arthropathy or other focal bone abnormality. IMPRESSION: Normal bilateral hips. Electronically Signed   By: Marijo Conception, M.D.  On: 07/12/2018 16:28   Dg Hip Unilat W Or Wo Pelvis 2-3 Views Left  Result Date: 07/08/2018 CLINICAL DATA:  Pain after fall. EXAM: DG HIP (WITH OR WITHOUT PELVIS) 2-3V LEFT COMPARISON:  None. FINDINGS: There is no evidence of hip fracture or dislocation. There is no evidence of arthropathy or other focal bone abnormality. IMPRESSION: Negative. Electronically Signed   By: Dorise Bullion III M.D   On: 07/08/2018 18:00   Dg Hip Unilat With Pelvis 2-3 Views Left  Result Date: 07/07/2018 CLINICAL DATA:  Fall at home today. Weakness and difficulty walking for the past few months. Bilateral hip pain, worse on the left. EXAM: DG HIP (WITH OR WITHOUT PELVIS) 2-3V RIGHT; DG HIP (WITH OR WITHOUT PELVIS) 2-3V LEFT COMPARISON:  CT abdomen and pelvis 06/11/2018 FINDINGS: Single view of the pelvis including the hips with two views of both hips obtained. Degenerative changes in the lower lumbar spine and in both hips. Diffuse bone demineralization. No evidence of acute fracture or dislocation involving the pelvis, right hip, or left hip. SI joints and symphysis pubis are not displaced. Calcified phleboliths in pelvis. IMPRESSION: Degenerative changes in the hips. No acute  displaced fractures identified. Electronically Signed   By: Lucienne Capers M.D.   On: 07/07/2018 22:20   Dg Hip Unilat  With Pelvis 2-3 Views Right  Result Date: 07/07/2018 CLINICAL DATA:  Fall at home today. Weakness and difficulty walking for the past few months. Bilateral hip pain, worse on the left. EXAM: DG HIP (WITH OR WITHOUT PELVIS) 2-3V RIGHT; DG HIP (WITH OR WITHOUT PELVIS) 2-3V LEFT COMPARISON:  CT abdomen and pelvis 06/11/2018 FINDINGS: Single view of the pelvis including the hips with two views of both hips obtained. Degenerative changes in the lower lumbar spine and in both hips. Diffuse bone demineralization. No evidence of acute fracture or dislocation involving the pelvis, right hip, or left hip. SI joints and symphysis pubis are not displaced. Calcified phleboliths in pelvis. IMPRESSION: Degenerative changes in the hips. No acute displaced fractures identified. Electronically Signed   By: Lucienne Capers M.D.   On: 07/07/2018 22:20      Discharge Exam: Vitals:   07/15/18 2118 07/16/18 0455  BP: (!) 144/77 (!) 145/74  Pulse: 87 70  Resp: 17 15  Temp: 98.6 F (37 C) 97.8 F (36.6 C)  SpO2: 98% 98%    General: Pt is alert, awake, not in acute distress Cardiovascular: RRR, S1/S2 +, no rubs, no gallops Respiratory: CTA bilaterally, no wheezing, no rhonchi Abdominal: Soft, NT, ND, bowel sounds + Extremities: no edema, no cyanosis    The results of significant diagnostics from this hospitalization (including imaging, microbiology, ancillary and laboratory) are listed below for reference.     Microbiology: No results found for this or any previous visit (from the past 240 hour(s)).   Labs: BNP (last 3 results) No results for input(s): BNP in the last 8760 hours. Basic Metabolic Panel: Recent Labs  Lab 07/12/18 1530 07/12/18 1957 07/13/18 0439 07/14/18 0358 07/15/18 0356 07/16/18 0356  NA 142  --  141 140 139 142  K 3.3*  --  3.5 4.4 4.4 4.4  CL 95*  --   97* 100 98 97*  CO2 31  --  33* 31 30 32  GLUCOSE 100*  --  101* 112* 139* 120*  BUN 21  --  19 22 23  34*  CREATININE 1.29*  --  1.27* 1.22* 1.34* 1.42*  CALCIUM 11.3*  --  10.8* 9.9  10.0 10.0  MG  --  1.5*  --  2.0  --   --    Liver Function Tests: Recent Labs  Lab 07/12/18 1530 07/13/18 0439  AST 24 21  ALT 10 11  ALKPHOS 69 58  BILITOT 0.8 0.6  PROT 7.0 6.1*  ALBUMIN 3.4* 2.9*   Recent Labs  Lab 07/12/18 1530  LIPASE 27   No results for input(s): AMMONIA in the last 168 hours. CBC: Recent Labs  Lab 07/12/18 1530 07/13/18 0439 07/14/18 0358 07/15/18 0356 07/16/18 0356  WBC 8.1 6.3 6.0 6.8 11.1*  NEUTROABS 6.4  --   --   --   --   HGB 12.2 10.7* 9.8* 10.0* 9.7*  HCT 34.3* 30.2* 27.8* 29.2* 28.1*  MCV 93.5 93.8 93.9 95.7 96.9  PLT 368 370 326 345 373   Cardiac Enzymes: No results for input(s): CKTOTAL, CKMB, CKMBINDEX, TROPONINI in the last 168 hours. BNP: Invalid input(s): POCBNP CBG: No results for input(s): GLUCAP in the last 168 hours. D-Dimer No results for input(s): DDIMER in the last 72 hours. Hgb A1c No results for input(s): HGBA1C in the last 72 hours. Lipid Profile No results for input(s): CHOL, HDL, LDLCALC, TRIG, CHOLHDL, LDLDIRECT in the last 72 hours. Thyroid function studies No results for input(s): TSH, T4TOTAL, T3FREE, THYROIDAB in the last 72 hours.  Invalid input(s): FREET3 Anemia work up No results for input(s): VITAMINB12, FOLATE, FERRITIN, TIBC, IRON, RETICCTPCT in the last 72 hours. Urinalysis    Component Value Date/Time   COLORURINE YELLOW 07/12/2018 1520   APPEARANCEUR CLEAR 07/12/2018 1520   LABSPEC 1.008 07/12/2018 1520   PHURINE 5.0 07/12/2018 1520   GLUCOSEU NEGATIVE 07/12/2018 1520   HGBUR NEGATIVE 07/12/2018 1520   BILIRUBINUR NEGATIVE 07/12/2018 1520   KETONESUR NEGATIVE 07/12/2018 1520   PROTEINUR NEGATIVE 07/12/2018 1520   UROBILINOGEN 0.2 03/10/2009 1608   NITRITE NEGATIVE 07/12/2018 [place] 07/12/2018 1520   Sepsis Labs Invalid input(s): PROCALCITONIN,  WBC,  LACTICIDVEN Microbiology No results found for this or any previous visit (from the past 240 hour(s)).   Patient was seen and examined on the day of discharge and was found to be in stable condition. Time coordinating discharge: 35 minutes including assessment and coordination of care, as well as examination of the patient.   SIGNED:  Dessa Phi, DO [facility] Pager [phone]  If 7PM-7AM, please contact night-coverage [email or web address] Password [facility] 07/16/2018, 8:37 AM

## 2018-07-16 NOTE — Evaluation (Signed)
Occupational Therapy Evaluation Patient Details Name: Erica Escobar MRN: [id number] DOB: May 29, 1936 Today's Date: 07/16/2018    History of Present Illness Erica Escobar is a 82 y.o. female with medical history significant for poorly differentiated colon adenocarcinoma, chronic back pain who is being admitted 07/12/18 with acute on chronic back pain.  CT imaging  showed evidence of a large soft tissue mass in the posterior chest wall with related bony destruction left 11th  rib fracture,  pathological fractures T8/9 ,, destruction of the right T11 and 12 transverse processes and may encroach on the right T11 neural foramen.    Clinical Impression   Met with pt lying supine, agreeable to OT this date. Pt presents with some mild cognitive difficulties when it comes to expressing needs and word finding. Pt also slow to process, frequently needing things repeated. Fixated on losing glasses this date, NT reports this fixation has been lasting days. Pt orientated to place and situation. Moments after initiation of session, transport arrived for pt to be d/c to SNF. Supine <> sit completed with mod A for BLE management and pull to sit up of upper body. While sitting EOB to obtain vitals from transport staff, pt frequently leaning back (anticipate 2/2 to fatigue). Several verbal and tactile cues given to maintain upright posture. Sit <> stand completed with 2WW with min A. Functional mobility completed from bed to stretcher in hallway with min A using 2WW. VC's needed during functional mobility for pt to pay attention to 2WW placement. Mod A needed for sit <> supine. SNF level of care appropriate for pt to increase functional independence in BADL this date.    Follow Up Recommendations  SNF    Equipment Recommendations  None recommended by OT    Recommendations for Other Services       Precautions / Restrictions Precautions Precautions: Fall Precaution Comments: pathological fxs T spine, ribs       Mobility Bed Mobility Overal bed mobility: Needs Assistance Bed Mobility: Supine to Sit     Supine to sit: Mod assist     General bed mobility comments: extra time, BLE management and needed assist to pull to sitting  Transfers Overall transfer level: Needs assistance Equipment used: Rolling walker (2 wheeled) Transfers: Sit to/from Stand Sit to Stand: Min assist              Balance Overall balance assessment: Needs assistance Sitting-balance support: Feet supported;No upper extremity supported Sitting balance-Leahy Scale: Fair Sitting balance - Comments: pt posterior leaning, anticipate from decreased trunk stability   Standing balance support: During functional activity;Bilateral upper extremity supported Standing balance-Leahy Scale: Poor Standing balance comment: needing min A, eaily fatigues, increased time and cueing needed to maintain solid base of support                           ADL either performed or assessed with clinical judgement   ADL Overall ADL's : Needs assistance/impaired Eating/Feeding: Set up   Grooming: Set up;Sitting   Upper Body Bathing: Minimal assistance;Sitting   Lower Body Bathing: Moderate assistance;Sit to/from stand;Cueing for safety   Upper Body Dressing : Minimal assistance;Sitting   Lower Body Dressing: Moderate assistance;Sit to/from stand;Cueing for safety   Toilet Transfer: Minimal assistance;RW;Cueing for safety Toilet Transfer Details (indicate cue type and reason): occasionally incontinent Toileting- Clothing Manipulation and Hygiene: Minimal assistance;Sit to/from stand               Vision  Baseline Vision/History: Wears glasses Wears Glasses: (stating glasses have been lost, poor historian) Patient Visual Report: No change from baseline       Perception     Praxis      Pertinent Vitals/Pain Pain Assessment: 0-10 Pain Score: 7  Pain Location: left posterior lower back, lower abdomen-  constipated per pt. Pain Intervention(s): Limited activity within patient's tolerance;Monitored during session;Premedicated before session     Hand Dominance     Extremity/Trunk Assessment Upper Extremity Assessment Upper Extremity Assessment: Generalized weakness   Lower Extremity Assessment Lower Extremity Assessment: Generalized weakness       Communication Communication Communication: No difficulties   Cognition Arousal/Alertness: Awake/alert Behavior During Therapy: WFL for tasks assessed/performed Overall Cognitive Status: Impaired/Different from baseline Area of Impairment: Problem solving                             Problem Solving: Slow processing General Comments: difficulty expressing needs (increased time, stops mid sentence), pt aware that her words do not come out correctly- gets frustrated   General Comments       Exercises     Shoulder Instructions      Home Living Family/patient expects to be discharged to:: Private residence Living Arrangements: Alone   Type of Home: Mobile home Home Access: Ramped entrance     Home Layout: One level               Home Equipment: Walker - 4 wheels          Prior Functioning/Environment Level of Independence: Independent with assistive device(s)                 OT Problem List: Decreased strength;Impaired balance (sitting and/or standing);Pain;Decreased safety awareness;Decreased activity tolerance;Decreased knowledge of use of DME or AE      OT Treatment/Interventions: Self-care/ADL training;DME and/or AE instruction;Balance training;Energy conservation    OT Goals(Current goals can be found in the care plan section) Acute Rehab OT Goals Patient Stated Goal: to get to rehab and get stronger OT Goal Formulation: With patient Time For Goal Achievement: 07/30/18 Potential to Achieve Goals: Good  OT Frequency:     Barriers to D/C:            Co-evaluation               AM-PAC PT "6 Clicks" Daily Activity     Outcome Measure Help from another person eating meals?: A Little Help from another person taking care of personal grooming?: A Little Help from another person toileting, which includes using toliet, bedpan, or urinal?: A Little Help from another person bathing (including washing, rinsing, drying)?: A Lot Help from another person to put on and taking off regular upper body clothing?: A Little Help from another person to put on and taking off regular lower body clothing?: A Lot 6 Click Score: 16   End of Session Equipment Utilized During Treatment: Gait belt;Rolling walker  Activity Tolerance: Patient tolerated treatment well Patient left: in bed;Other (comment)(on stretcher leaving to SNF)  OT Visit Diagnosis: Other abnormalities of gait and mobility (R26.89);Pain Pain - part of body: (back)                Time: 2263-3354 OT Time Calculation (min): 22 min Charges:  OT General Charges $OT Visit: 1 Visit OT Evaluation $OT Eval Moderate Complexity: [place], MSOT, OTR/L [phone]  Zenovia Jarred 07/16/2018, 1:16 PM

## 2018-07-16 NOTE — Clinical Social Work Placement (Signed)
Patient received and accepted bed offer at [facility]. Facility aware of patient's discharge and confirmed bed offer. PTAR contacted, patient's family notified. Patient's RN can call report to [phone], packet complete. CSW signing off, no other needs identified at this time.  CLINICAL SOCIAL WORK PLACEMENT  NOTE  Date:  07/16/2018  Patient Details  Name: Erica Escobar MRN: [id number] Date of Birth: November 22, 1936  Clinical Social Work is seeking post-discharge placement for this patient at the [place] level of care (*CSW will initial, date and re-position this form in  chart as items are completed):  Yes   Patient/family provided with [place] Work Department's list of facilities offering this level of care within the geographic area requested by the patient (or if unable, by the patient's family).  Yes   Patient/family informed of their freedom to choose among providers that offer the needed level of care, that participate in Medicare, Medicaid or managed care program needed by the patient, have an available bed and are willing to accept the patient.  Yes   Patient/family informed of [facility]'s ownership interest in [facility] and [facility], as well as of the fact that they are under no obligation to receive care at these facilities.  PASRR submitted to EDS on       PASRR number received on 07/15/18     Existing PASRR number confirmed on       FL2 transmitted to all facilities in geographic area requested by pt/family on 07/15/18     FL2 transmitted to all facilities within larger geographic area on       Patient informed that his/her managed care company has contracts with or will negotiate with certain facilities, including the following:        Yes   Patient/family informed of bed offers received.  Patient chooses bed at [facility]     Physician recommends and patient chooses bed at      Patient to be  transferred to [facility] on 07/16/18.  Patient to be transferred to facility by PTAR     Patient family notified on 07/16/18 of transfer.  Name of family member notified:  [facility]     PHYSICIAN       Additional Comment:    _______________________________________________ Burnis Medin, LCSW 07/16/2018, 12:00 PM

## 2018-07-16 NOTE — Evaluation (Signed)
OT Cancellation Note  Patient Details Name: Erica Escobar MRN: [id number] DOB: [birth year]   Cancelled Treatment:     Met with pt, pain limiting ability to participate this date. Note d/c order in place, plans to meet with pt prior to d/c. Pt states "my pain is well over a 10". RN in room administering pain medication, will check in with pt after medication given opportunity to have effects.   Zen[place]ia Jarred, MSOT, OTR/L   [place] 07/16/2018, 10:44 AM

## 2018-07-18 ENCOUNTER — Ambulatory Visit
Admission: RE | Admit: 2018-07-18 | Discharge: 2018-07-18 | Disposition: A | Discharge status: Home / Self Care | Payer: Medicare Other | Source: Ambulatory Visit | Attending: Radiation Oncology | Admitting: Radiation Oncology

## 2018-07-18 ENCOUNTER — Encounter: Payer: Self-pay | Admitting: Internal Medicine

## 2018-07-18 ENCOUNTER — Non-Acute Institutional Stay (SKILLED_NURSING_FACILITY): Payer: Medicare Other | Admitting: Internal Medicine

## 2018-07-18 ENCOUNTER — Telehealth: Payer: Self-pay

## 2018-07-18 DIAGNOSIS — R222 Localized swelling, mass and lump, trunk: Secondary | ICD-10-CM

## 2018-07-18 DIAGNOSIS — K219 Gastro-esophageal reflux disease without esophagitis: Secondary | ICD-10-CM

## 2018-07-18 DIAGNOSIS — M8448XA Pathological fracture, other site, initial encounter for fracture: Secondary | ICD-10-CM | POA: Diagnosis not present

## 2018-07-18 DIAGNOSIS — N183 Chronic kidney disease, stage 3 unspecified: Secondary | ICD-10-CM

## 2018-07-18 DIAGNOSIS — C182 Malignant neoplasm of ascending colon: Secondary | ICD-10-CM

## 2018-07-18 DIAGNOSIS — C7951 Secondary malignant neoplasm of bone: Secondary | ICD-10-CM | POA: Diagnosis not present

## 2018-07-18 DIAGNOSIS — R630 Anorexia: Secondary | ICD-10-CM

## 2018-07-18 DIAGNOSIS — M546 Pain in thoracic spine: Secondary | ICD-10-CM | POA: Diagnosis not present

## 2018-07-18 DIAGNOSIS — E785 Hyperlipidemia, unspecified: Secondary | ICD-10-CM

## 2018-07-18 DIAGNOSIS — C799 Secondary malignant neoplasm of unspecified site: Secondary | ICD-10-CM | POA: Diagnosis not present

## 2018-07-18 DIAGNOSIS — M545 Low back pain: Secondary | ICD-10-CM

## 2018-07-18 DIAGNOSIS — G8929 Other chronic pain: Secondary | ICD-10-CM

## 2018-07-18 NOTE — Telephone Encounter (Signed)
LVM with daughter to inform that pt will be scheduled for appt this Wednesday and questions regarding biopsy can be asked then.

## 2018-07-18 NOTE — Progress Notes (Signed)
:    Location:  [place] Room Number: [phone] Place of Service:  SNF (31)  Noah Delaine. Sheppard Coil, MD  Patient Care Team: Deland Pretty, MD as PCP - General (Internal Medicine) Michael Boston, MD as Consulting Physician (General Surgery) Carol Ada, MD as Consulting Physician (Gastroenterology) Jacolyn Reedy, MD as Consulting Physician (Cardiology)  Extended Emergency Contact Information Primary Emergency Contact: [place], [place] [address] Johnnette Litter of [place] Phone: [phone] Mobile Phone: [phone] Relation: Son Secondary Emergency Contact: Jone Baseman, [place] [address] Johnnette Litter of [place] Phone: [phone] Mobile Phone: [phone] Relation: Relative     Allergies: Azithromycin; Allopurinol; Cephalexin; Codeine; Gabapentin; Niaspan [niacin]; Oxybutynin; Paxil [paroxetine hcl]; Penicillins; Pentazocine; Tizanidine; Tramadol; and Vicodin [hydrocodone-acetaminophen]  Chief Complaint  Patient presents with  . New Admit To SNF    Admit to [facility]    HPI: Patient is 82 y.o. female with poorly differentiated colon adenocarcinoma status post treatment in 2016 as well as chronic back pain who was admitted to [facility] long hospital from 7/30-8/3 for acute on chronic back pain where she was found to have a large posterior chest wall mass concerning for metastatic disease.  Patient had worsening of her chronic back pain for the past 2 weeks, and she had also had lost 30 pounds unintentionally in the past year.  She had had regular follow-up with Dr. Malachy Mood with the oncology team with no evidence of recurrence thus far.  In the ER CT showed evidence of large soft tissue mass in the posterior chest wall with related bony destruction and rib fractures and also a perinephric mass in the left anterior wall mass.  She underwent a CT-guided biopsy of the right paraspinal mass on 7/31 which revealed poorly  differentiated malignant neoplasm.  She started radiation treatment while in the hospital.  She is admitted to skilled nursing facility for OT/PT.  While at skilled nursing facility patient will be followed for GERD treated with Protonix, decreased appetite treated with Remeron at bedtime and hyperlipidemia treated with Lopid.  Past Medical History:  Diagnosis Date  . Abnormal tympanic membrane    right ear and mastoid problems  . Arthritis    oseoarthritis  . Cancer of ascending colon ([place]) 02/15/2015  . Constipation, chronic   . COPD (chronic obstructive pulmonary disease) (HCC)    mild COPD  . Depression   . Diverticulosis   . GERD (gastroesophageal reflux disease)   . Glaucoma    bilateral-sugery with laser, using eye drops  . Hemorrhoids    Internal  . History of hiatal hernia    dx. '87 Schatzki's ring  . Hyperlipidemia    good control  . Hypertension   . Insomnia    hx. of  . Keratosis, actinic   . Leg cramps   . Macular degeneration   . Neuromuscular disorder (HCC)    neuropathy legs,   . On aspirin at home    chronic use  . PONV (postoperative nausea and vomiting)   . Restless leg syndrome   . Schatzki's ring 1987  . Skin cancer    multiple, "tx. area right anterior wrist"  . Urinary incontinence    occ, stress    Past Surgical History:  Procedure Laterality Date  . ABDOMINAL HYSTERECTOMY    . BLADDER REPAIR    . CARDIAC CATHETERIZATION  2007, 2009  .  CATARACT EXTRACTION Bilateral   . CHOLECYSTECTOMY    . COLECTOMY    . FINGER NAIL SURGERY    . FOOT SURGERY Right   . HEMORROIDECTOMY    . INNER EAR SURGERY Right    3-4 times  . INTRAOPERATIVE ARTERIOGRAM     right arm  . KNEE SURGERY Right   . LEG SURGERY Bilateral    laser ablation  . MASTOID DEBRIDEMENT     x2  . SKIN CANCER EXCISION     right/left thumb nail, left face, right ear/neck, right leg  . TOE FUSION    . TONSILLECTOMY      Allergies as of 07/18/2018      Reactions   Azithromycin  Itching   Allopurinol    Cephalexin    Codeine Itching   Gabapentin Swelling   Throat and leg swelling   Niaspan [niacin]    Oxybutynin Other (See Comments)   "made me go crazy"-- had crazy dreams.    Paxil [paroxetine Hcl]    Unknown reaction   Penicillins Itching   Has patient had a PCN reaction causing immediate rash, facial/tongue/throat swelling, SOB or lightheadedness with hypotension: No Has patient had a PCN reaction causing severe rash involving mucus membranes or skin necrosis: Yes Has patient had a PCN reaction that required hospitalization: No Has patient had a PCN reaction occurring within the last 10 years: No If all of the above answers are "NO", then may proceed with Cephalosporin use.   Pentazocine Itching, Nausea Only   Headache.    Tizanidine Other (See Comments)   "made me crazy"   Tramadol    Head feels "swimmy" and weakness   Vicodin [hydrocodone-acetaminophen] Other (See Comments)   "made me go crazy"      Medication List        Accurate as of 07/18/18  1:14 PM. Always use your most recent med list.          aspirin EC 81 MG tablet Take 81 mg by mouth every morning.   beta carotene w/minerals tablet Take 1 tablet by mouth daily.   cyclobenzaprine 5 MG tablet Commonly known as:  FLEXERIL Take 5 mg by mouth 3 (three) times daily.   dexamethasone 4 MG tablet Commonly known as:  DECADRON Take 1 tablet (4 mg total) by mouth 2 (two) times daily.   FISH OIL PO Take 2 capsules by mouth daily with breakfast.   furosemide 40 MG tablet Commonly known as:  LASIX Take 20 mg by mouth daily.   gemfibrozil 600 MG tablet Commonly known as:  LOPID Take 600 mg by mouth every morning.   loratadine 10 MG tablet Commonly known as:  CLARITIN Take 10 mg by mouth daily as needed for allergies.   meloxicam 15 MG tablet Commonly known as:  MOBIC Take 15 mg by mouth daily.   mirtazapine 15 MG tablet Commonly known as:  REMERON Take 7.5 mg by mouth at  bedtime.   oxyCODONE-acetaminophen 5-325 MG tablet Commonly known as:  PERCOCET/ROXICET Take 1-2 tablets by mouth every 4 (four) hours as needed for up to 3 days for severe pain.   pantoprazole 40 MG tablet Commonly known as:  PROTONIX Take 40 mg by mouth daily.   polyethylene glycol packet Commonly known as:  MIRALAX / GLYCOLAX Take 17 g by mouth daily as needed for moderate constipation.   potassium chloride 10 MEQ tablet Commonly known as:  K-DUR Take 10 mEq by mouth daily.   promethazine 25  MG tablet Commonly known as:  PHENERGAN Take 12.5 mg by mouth every 8 (eight) hours as needed for nausea or vomiting.   REFRESH 1.4-0.6 % Soln Generic drug:  Polyvinyl Alcohol-Povidone PF Place 6 drops into both eyes daily.   trolamine salicylate 10 % cream Commonly known as:  ASPERCREME Apply 1 application topically as needed for muscle pain (as needed for back pain).   ULORIC 40 MG tablet Generic drug:  febuxostat Take 40 mg by mouth daily.   VITAMIN B 12 PO Take 1,000 mg by mouth every morning.       No orders of the defined types were placed in this encounter.   Immunization History  Administered Date(s) Administered  . Influenza, High Dose Seasonal PF 12/03/2017    Social History   Tobacco Use  . Smoking status: Former Research scientist (life sciences)  . Smokeless tobacco: Never Used  . Tobacco comment: short time x1 - during pregnancy  Substance Use Topics  . Alcohol use: No    Alcohol/week: 0.0 oz    Family history is mother with diabetes, father with lymphoma, brother with lung cancer, and another brother with bladder cancer.  History reviewed. No pertinent family history.    Review of Systems  DATA OBTAINED: from patient GENERAL:  no fevers, fatigue, appetite changes SKIN: No itching, or rash EYES: No eye pain, redness, discharge EARS: No earache, tinnitus, change in hearing NOSE: No congestion, drainage or bleeding  MOUTH/THROAT: No mouth or tooth pain, No sore  throat RESPIRATORY: No cough, wheezing, SOB CARDIAC: No chest pain, palpitations, lower extremity edema  GI: No abdominal pain, No N/V/D or constipation, No heartburn or reflux  GU: No dysuria, frequency or urgency, or incontinence  MUSCULOSKELETAL: No unrelieved bone/joint pain NEUROLOGIC: No headache, dizziness or focal weakness PSYCHIATRIC: No c/o anxiety or sadness   Vitals:   07/18/18 1255  BP: 133/82  Pulse: (!) 106  Resp: 20  Temp: 98 F (36.7 C)  SpO2: 97%    SpO2 Readings from Last 1 Encounters:  07/18/18 97%   Body mass index is 26.75 kg/m.     Physical Exam  GENERAL APPEARANCE: Alert, conversant,  No acute distress.  SKIN: No diaphoresis rash HEAD: Normocephalic, atraumatic  EYES: Conjunctiva/lids clear. Pupils round, reactive. EOMs intact.  EARS: External exam WNL, canals clear. Hearing grossly normal.  NOSE: No deformity or discharge.  MOUTH/THROAT: Lips w/o lesions  RESPIRATORY: Breathing is even, unlabored. Lung sounds are clear   CARDIOVASCULAR: Heart RRR no murmurs, rubs or gallops. No peripheral edema.   GASTROINTESTINAL: Abdomen is soft, non-tender, not distended w/ normal bowel sounds. GENITOURINARY: Bladder non tender, not distended  MUSCULOSKELETAL: No abnormal joints or musculature NEUROLOGIC:  Cranial nerves 2-12 grossly intact. Moves all extremities  PSYCHIATRIC: Mood and affect appropriate to situation, no behavioral issues  Patient Active Problem List   Diagnosis Date Noted  . Metastatic cancer ([place]) 07/12/2018  . Chronic back pain 07/12/2018  . Pathologic rib fracture, initial encounter 07/12/2018  . Chest wall mass 07/12/2018  . Cancer of ascending colon s/p robotic colectomy 02/15/2015 02/15/2015  . Restless leg syndrome   . Depression   . Insomnia   . COPD (chronic obstructive pulmonary disease) ([place])   . GERD (gastroesophageal reflux disease)       Labs reviewed: Basic Metabolic Panel:    Component Value Date/Time   NA 142  07/16/2018 0356   K 4.4 07/16/2018 0356   CL 97 (L) 07/16/2018 0356   CO2 32 07/16/2018 0356  GLUCOSE 120 (H) 07/16/2018 0356   BUN 34 (H) 07/16/2018 0356   CREATININE 1.42 (H) 07/16/2018 0356   CREATININE 1.39 (H) 06/28/2018 1135   CALCIUM 10.0 07/16/2018 0356   PROT 6.1 (L) 07/13/2018 0439   ALBUMIN 2.9 (L) 07/13/2018 0439   AST 21 07/13/2018 0439   AST 17 06/28/2018 1135   ALT 11 07/13/2018 0439   ALT 9 06/28/2018 1135   ALKPHOS 58 07/13/2018 0439   BILITOT 0.6 07/13/2018 0439   BILITOT 0.5 06/28/2018 1135   GFRNONAA 34 (L) 07/16/2018 0356   GFRNONAA 35 (L) 06/28/2018 1135   GFRAA 40 (L) 07/16/2018 0356   GFRAA 41 (L) 06/28/2018 1135    Recent Labs    07/12/18 1957  07/14/18 0358 07/15/18 0356 07/16/18 0356  NA  --    < > 140 139 142  K  --    < > 4.4 4.4 4.4  CL  --    < > 100 98 97*  CO2  --    < > 31 30 32  GLUCOSE  --    < > 112* 139* 120*  BUN  --    < > 22 23 34*  CREATININE  --    < > 1.22* 1.34* 1.42*  CALCIUM  --    < > 9.9 10.0 10.0  MG 1.5*  --  2.0  --   --    < > = values in this interval not displayed.   Liver Function Tests: Recent Labs    06/28/18 1135 07/12/18 1530 07/13/18 0439  AST 17 24 21   ALT 9 10 11   ALKPHOS 61 69 58  BILITOT 0.5 0.8 0.6  PROT 6.2* 7.0 6.1*  ALBUMIN 3.1* 3.4* 2.9*   Recent Labs    07/12/18 1530  LIPASE 27   No results for input(s): AMMONIA in the last 8760 hours. CBC: Recent Labs    06/28/18 1135 07/08/18 1606 07/12/18 1530  07/14/18 0358 07/15/18 0356 07/16/18 0356  WBC 8.7 6.4 8.1   < > 6.0 6.8 11.1*  NEUTROABS 6.7* 4.7 6.4  --   --   --   --   HGB 11.3* 10.7* 12.2   < > 9.8* 10.0* 9.7*  HCT 31.6* 29.6* 34.3*   < > 27.8* 29.2* 28.1*  MCV 94.7 93.7 93.5   < > 93.9 95.7 96.9  PLT 312 347 368   < > 326 345 373   < > = values in this interval not displayed.   Lipid No results for input(s): CHOL, HDL, LDLCALC, TRIG in the last 8760 hours.  Cardiac Enzymes: No results for input(s): CKTOTAL, CKMB,  CKMBINDEX, TROPONINI in the last 8760 hours. BNP: No results for input(s): BNP in the last 8760 hours. No results found for: [facility] Lab Results  Component Value Date   HGBA1C 5.1 02/11/2015   TSH- 0.499 No results found for: VITAMINB12 No results found for: FOLATE No results found for: IRON, TIBC, FERRITIN  Imaging and Procedures obtained prior to SNF admission: No results found.   Not all labs, radiology exams or other studies done during hospitalization come through on my EPIC note; however they are reviewed by me.    Assessment and Plan  History of poorly differentiated adenocarcinoma of ascending colon status post right colectomy 2016   soft tissue chest wall mass with destruction of right posterior T11 through TPA 9 spinous process T8-T9 posterior right ribs 10, 11; subcutaneous mid anterior left abdominal wall and enlarging  left perinephric mass; status post CT-guided biopsy of the right paraspinal mass showing poorly differentiated malignant neoplasm; radiation treatment 10, started SNF -admitted for OT/PT; continue #10 radiation treatments; continue Decadron 4 mg twice daily Uloric 40 mg daily; pt to f/u oncology to find out new dx  Acute on chronic back pain SNF -improved with pain medications we will schedule Percocet 5/325 every 6  CKD stage III-baseline creatinine 1.3-1.5 SNF -follow-up BMP  GERD SNF -continue Protonix 40 mg daily  Anorexia SNF -continue Remeron 7.5 mg nightly  Hyperlipidemia SNF -not stated as uncontrolled; continue Lopid 600 mg every morning   Time spent greater than 45 minutes;> 50% of time with patient was spent reviewing records, labs, tests and studies, counseling and developing plan of care  Webb Silversmith D. Sheppard Coil, MD

## 2018-07-19 ENCOUNTER — Telehealth: Payer: Self-pay | Admitting: Oncology

## 2018-07-19 ENCOUNTER — Telehealth: Payer: Self-pay | Admitting: Emergency Medicine

## 2018-07-19 ENCOUNTER — Ambulatory Visit
Admission: RE | Admit: 2018-07-19 | Discharge: 2018-07-19 | Disposition: A | Discharge status: Home / Self Care | Payer: Medicare Other | Source: Ambulatory Visit | Attending: Radiation Oncology | Admitting: Radiation Oncology

## 2018-07-19 DIAGNOSIS — C182 Malignant neoplasm of ascending colon: Secondary | ICD-10-CM | POA: Diagnosis not present

## 2018-07-19 DIAGNOSIS — C7951 Secondary malignant neoplasm of bone: Secondary | ICD-10-CM | POA: Diagnosis not present

## 2018-07-19 NOTE — Telephone Encounter (Signed)
Attempted to call [facility] to make sure they are aware of pts appt on 8/7. No answer , will try again later.

## 2018-07-19 NOTE — Telephone Encounter (Signed)
Called pt re appts that were added per 8/5 sch msg - left vm for pt re appts.

## 2018-07-20 ENCOUNTER — Ambulatory Visit
Admission: RE | Admit: 2018-07-20 | Discharge: 2018-07-20 | Disposition: A | Discharge status: Home / Self Care | Payer: Medicare Other | Source: Ambulatory Visit | Attending: Radiation Oncology | Admitting: Radiation Oncology

## 2018-07-20 ENCOUNTER — Encounter: Payer: Self-pay | Admitting: Nurse Practitioner

## 2018-07-20 ENCOUNTER — Inpatient Hospital Stay: Payer: Medicare Other | Attending: Nurse Practitioner | Admitting: Nurse Practitioner

## 2018-07-20 ENCOUNTER — Telehealth: Payer: Self-pay | Admitting: Emergency Medicine

## 2018-07-20 ENCOUNTER — Telehealth: Payer: Self-pay | Admitting: Oncology

## 2018-07-20 VITALS — BP 130/80 | HR 86 | Temp 98.1°F | Resp 14

## 2018-07-20 DIAGNOSIS — Z5112 Encounter for antineoplastic immunotherapy: Secondary | ICD-10-CM | POA: Insufficient documentation

## 2018-07-20 DIAGNOSIS — L98499 Non-pressure chronic ulcer of skin of other sites with unspecified severity: Secondary | ICD-10-CM | POA: Diagnosis not present

## 2018-07-20 DIAGNOSIS — D6481 Anemia due to antineoplastic chemotherapy: Secondary | ICD-10-CM | POA: Diagnosis not present

## 2018-07-20 DIAGNOSIS — T380X5A Adverse effect of glucocorticoids and synthetic analogues, initial encounter: Secondary | ICD-10-CM | POA: Insufficient documentation

## 2018-07-20 DIAGNOSIS — Z5111 Encounter for antineoplastic chemotherapy: Secondary | ICD-10-CM | POA: Diagnosis not present

## 2018-07-20 DIAGNOSIS — D72829 Elevated white blood cell count, unspecified: Secondary | ICD-10-CM | POA: Insufficient documentation

## 2018-07-20 DIAGNOSIS — B370 Candidal stomatitis: Secondary | ICD-10-CM | POA: Diagnosis not present

## 2018-07-20 DIAGNOSIS — D701 Agranulocytosis secondary to cancer chemotherapy: Secondary | ICD-10-CM | POA: Insufficient documentation

## 2018-07-20 DIAGNOSIS — C182 Malignant neoplasm of ascending colon: Secondary | ICD-10-CM | POA: Diagnosis not present

## 2018-07-20 DIAGNOSIS — C7951 Secondary malignant neoplasm of bone: Secondary | ICD-10-CM | POA: Diagnosis not present

## 2018-07-20 DIAGNOSIS — C8580 Other specified types of non-Hodgkin lymphoma, unspecified site: Secondary | ICD-10-CM

## 2018-07-20 DIAGNOSIS — C8339 Diffuse large B-cell lymphoma, extranodal and solid organ sites: Secondary | ICD-10-CM | POA: Insufficient documentation

## 2018-07-20 DIAGNOSIS — C8330 Diffuse large B-cell lymphoma, unspecified site: Secondary | ICD-10-CM

## 2018-07-20 LAB — BASIC METABOLIC PANEL
BUN: 43 — AB (ref 4–21)
Creatinine: 1.3 — AB (ref 0.5–1.1)
Glucose: 116
POTASSIUM: 4.7 (ref 3.4–5.3)
SODIUM: 141 (ref 137–147)

## 2018-07-20 LAB — CBC AND DIFFERENTIAL
HCT: 30 — AB (ref 36–46)
HEMOGLOBIN: 10.1 — AB (ref 12.0–16.0)
Platelets: 381 (ref 150–399)
WBC: 8.8

## 2018-07-20 MED ORDER — FLUCONAZOLE 100 MG PO TABS: 100.0000 mg | ORAL_TABLET | Freq: Every day | ORAL | 0 refills | Status: AC

## 2018-07-20 NOTE — Progress Notes (Addendum)
Erica Escobar   Diagnosis: Non-Hodgkin's lymphoma, chemotherapy  INTERVAL HISTORY:   Erica Escobar returns for follow-up.  She began radiation to the paraspinous mass 07/14/2018.  She was discharged from the hospital 07/16/2018.  She is currently residing at a nursing facility.  She reports overall her back pain is better.  She is currently on Decadron 4 mg twice daily.  She takes Percocet as needed.  She feels the Percocet is effective. Bowels moving regularly recently.  No urinary symptoms.  She continues to feel weak.  Overall good appetite.  She complains of mouth discomfort.  Objective:  Vital signs in last 24 hours:  Blood pressure 130/80, pulse 86, temperature 98.1 F (36.7 C), temperature source Oral, resp. rate 14, SpO2 100 %.    HEENT: Early thrush bilateral buccal mucosa. Resp: Lungs clear bilaterally. Cardio: Regular rate and rhythm. GI: Abdomen soft and nontender.  No hepatospleno megaly. Vascular: No leg edema. Neuro: Alert and oriented.  Follows commands.  Question mild decrease in strength right proximal leg.  Motor strength otherwise appears intact. Skin: Ecchymoses scattered over the forearms.    Lab Results:  Lab Results  Component Value Date   WBC 8.8 07/20/2018   HGB 10.1 (A) 07/20/2018   HCT 30 (A) 07/20/2018   MCV 96.9 07/16/2018   PLT 381 07/20/2018   NEUTROABS 6.4 07/12/2018    Imaging:  No results found.  Medications: I have reviewed the patient's current medications.  Assessment/Plan: 1. 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 Loss of MLH1 and PMS2, MSI-high, BRAF mutation detected ? Colonoscopy February 2017 ? Colonoscopy 12/16/2017-3 mm polyp in the transverse colon, 10 mm polyp in the sigmoid colon. Diverticulosis in the sigmoid colon. Transverse colon polyp, tubular adenoma; sigmoid colon polyp, tubular adenoma. ? CT  07/12/2018- probable subcutaneous metastasis at the left abdominal wall, left perinephric mass, destructive thoracic/lumbar paraspinous mass ? CT-guided biopsy of the paraspinous mass 07/13/2018  2. Anemia-likely secondary to bleeding from the colon cancer and surgery. Hemoglobin in normal range 03/20/2016, mild persistent anemia 3. Ascending colon "polyps" removed on the colonoscopy 12/22/2014 with the pathology revealing poorly differentiated adenocarcinoma and a sessile serrated adenoma polyp, similar to the pathology on the hepatic flexure mass 4. History of multiple skin cancers. Seen by dermatology every 3 months. 5. Glaucoma 6.Non-Hodgkin's lymphoma presenting with severe back pain  CT abdomen/pelvis 07/12/2018- large destructive right paraspinal chest wall mass lower thoracic/upper lumbar spine with associated bone destruction involving the right posterior elements of T9, T10 and T11, spinous processes of T8 and T9, and the posterior right 10th and 11th ribs.  Probable subcutaneous metastasis anterior left abdominal wall 2.2 cm; enlarging mass in the left perinephric space 3.0 cm in size.  CT L-spine 07/12/2018-paraspinous soft tissue mass on the right at the lower thoracic spine causing destruction of the right T11 and 12 transverse processes; may encroach on the right T11 neural foramen.  CT-guided biopsy paraspinous mass 07/13/2018- poorly differentiated malignant neoplasm; CD45, CD20, bcl-6 and bcl-2 positive.  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  Lymphoma FISH panel pending.  Radiation to paraspinous mass initiated 07/14/2018 7.Anorexia/weight loss 8.  Mild elevation of calcium- question hypercalcemia of malignancy   Disposition: Erica Escobar has been diagnosed with what appears to be an aggressive large B-cell lymphoma.  Lymphoma FISH panel is pending.  Dr. Benay Spice reviewed the diagnosis with [name] at [employer]  visit.  She is currently completing a  course of radiation to the paraspinous mass for pain control.  Dr. Benay Spice discussed treatment with CVP/Rituxan.  We reviewed potential toxicities associated with the chemotherapy including bone marrow toxicity, nausea/vomiting, alopecia, mouth sores.  We discussed the neuropathy associated with vincristine as well as the vesicant properties of vincristine.  We reviewed potential toxicities associated with Rituxan including an allergic reaction, reactivation of hepatitis B, progressive multifocal leukoencephalopathy.  She agrees to proceed. She will attend a chemotherapy education class.  We will ask the chemotherapy education nurse to evaluate peripheral venous access.  She may require a Port-A-Cath.  On the day of chemotherapy class we will request labs to include a CBC, chemistry panel, LDH and hepatitis B surface antigen and core antibody.  We are referring her for a staging PET scan.  She is currently on dexamethasone 4 mg twice daily.  We will decrease to 4 mg once daily.  She has oral candidiasis.  She will complete a 4-day course of Diflucan 100 mg daily.  We will see her in follow-up prior to cycle 1 CVP/Rituxan on 07/29/2018.  She will contact the office in the interim with any problems.  Patient seen with Dr. Benay Spice.  40 minutes were spent with the patient today.  The majority of that time involved in counseling/coordination of care.    Ned Card ANP/GNP-BC   07/20/2018  3:59 PM This was a shared visit with Ned Card.  Erica Escobar was interviewed and examined.  The final pathology from the paraspinous mass biopsy returned consistent with large B-cell lymphoma.  Erica Escobar has started a course of palliative radiation.  The pain has improved. I discussed the case with Dr. Sondra Come.  Radiation will be discontinued after the dose on 07/22/2018.  We discussed the diagnosis of large B-cell lymphoma and treatment options with Erica Escobar.  She will be referred for a staging PET scan.  The  biopsy material will be submitted for molecular testing.  Erica Escobar has a poor performance status and multiple comorbid conditions.  She does not appear to be a candidate for standard R-CHOP unless her performance status improves.  The plan is to begin treatment with CVP-rituximab on 07/29/2018.  We reviewed potential toxicities associated with the CVP chemotherapy regimen and rituximab.  She agrees to proceed.  She will be referred for placement of a Port-A-Cath for pick if her peripheral IV access is not adequate.  We taper the Decadron to 4 mg daily and she will begin treatment for oral candidiasis.  A chemotherapy plan was entered.  Julieanne Manson, MD

## 2018-07-20 NOTE — Progress Notes (Signed)
START OFF PATHWAY REGIMEN - Lymphoma and CLL   [id number]:R-CVP q21 days:   A cycle is every 21 days:     Rituximab      Cyclophosphamide      Vincristine      Prednisone   **Always confirm dose/schedule in your pharmacy ordering system**  Patient Characteristics: Diffuse Large B-Cell Lymphoma, First Line, Stage III and IV Disease Type: Not Applicable Disease Type: Diffuse Large B-Cell Lymphoma Disease Type: Not Applicable Line of therapy: First Line [place] Stage: IV Intent of Therapy: Curative Intent, Discussed with Patient

## 2018-07-20 NOTE — Patient Instructions (Signed)
Decrease decadron to once daily

## 2018-07-20 NOTE — Telephone Encounter (Signed)
Spoke to [facility]. Verbal given to decreased decadron to 1 tab/daily.

## 2018-07-20 NOTE — Telephone Encounter (Signed)
Appts scheduled AVS/Calendar printed per 8/7 los

## 2018-07-21 ENCOUNTER — Inpatient Hospital Stay: Payer: Medicare Other

## 2018-07-21 ENCOUNTER — Ambulatory Visit
Admission: RE | Admit: 2018-07-21 | Discharge: 2018-07-21 | Disposition: A | Discharge status: Home / Self Care | Payer: Medicare Other | Source: Ambulatory Visit | Attending: Radiation Oncology | Admitting: Radiation Oncology

## 2018-07-21 ENCOUNTER — Other Ambulatory Visit: Payer: Self-pay | Admitting: Oncology

## 2018-07-21 DIAGNOSIS — B370 Candidal stomatitis: Secondary | ICD-10-CM | POA: Diagnosis not present

## 2018-07-21 DIAGNOSIS — Z5111 Encounter for antineoplastic chemotherapy: Secondary | ICD-10-CM | POA: Diagnosis not present

## 2018-07-21 DIAGNOSIS — C8330 Diffuse large B-cell lymphoma, unspecified site: Secondary | ICD-10-CM

## 2018-07-21 DIAGNOSIS — D72829 Elevated white blood cell count, unspecified: Secondary | ICD-10-CM | POA: Diagnosis not present

## 2018-07-21 DIAGNOSIS — L98499 Non-pressure chronic ulcer of skin of other sites with unspecified severity: Secondary | ICD-10-CM | POA: Diagnosis not present

## 2018-07-21 DIAGNOSIS — C8339 Diffuse large B-cell lymphoma, extranodal and solid organ sites: Secondary | ICD-10-CM | POA: Diagnosis not present

## 2018-07-21 DIAGNOSIS — D6481 Anemia due to antineoplastic chemotherapy: Secondary | ICD-10-CM | POA: Diagnosis not present

## 2018-07-21 DIAGNOSIS — D701 Agranulocytosis secondary to cancer chemotherapy: Secondary | ICD-10-CM | POA: Diagnosis not present

## 2018-07-21 DIAGNOSIS — T380X5A Adverse effect of glucocorticoids and synthetic analogues, initial encounter: Secondary | ICD-10-CM | POA: Diagnosis not present

## 2018-07-21 DIAGNOSIS — Z5112 Encounter for antineoplastic immunotherapy: Secondary | ICD-10-CM | POA: Diagnosis not present

## 2018-07-21 DIAGNOSIS — C7951 Secondary malignant neoplasm of bone: Secondary | ICD-10-CM | POA: Diagnosis not present

## 2018-07-21 DIAGNOSIS — C182 Malignant neoplasm of ascending colon: Secondary | ICD-10-CM | POA: Diagnosis not present

## 2018-07-21 LAB — CMP (CANCER CENTER ONLY)
ALK PHOS: 54 U/L (ref 38–126)
ALT: 7 U/L (ref 0–44)
ANION GAP: 11 (ref 5–15)
AST: 10 U/L — AB (ref 15–41)
Albumin: 2.7 g/dL — ABNORMAL LOW (ref 3.5–5.0)
BILIRUBIN TOTAL: 0.4 mg/dL (ref 0.3–1.2)
BUN: 40 mg/dL — AB (ref 8–23)
CALCIUM: 8.9 mg/dL (ref 8.9–10.3)
CO2: 28 mmol/L (ref 22–32)
Chloride: 99 mmol/L (ref 98–111)
Creatinine: 1.42 mg/dL — ABNORMAL HIGH (ref 0.44–1.00)
GFR, EST NON AFRICAN AMERICAN: 34 mL/min — AB (ref >60)
GFR, Est AFR Am: 40 mL/min — ABNORMAL LOW (ref >60)
Glucose, Bld: 95 mg/dL (ref 70–99)
POTASSIUM: 4.1 mmol/L (ref 3.5–5.1)
Sodium: 138 mmol/L (ref 135–145)
TOTAL PROTEIN: 6 g/dL — AB (ref 6.5–8.1)

## 2018-07-21 LAB — CBC WITH DIFFERENTIAL (CANCER CENTER ONLY)
BASOS ABS: 0 10*3/uL (ref 0.0–0.1)
BASOS PCT: 0 %
EOS PCT: 0 %
Eosinophils Absolute: 0 10*3/uL (ref 0.0–0.5)
HCT: 32.3 % — ABNORMAL LOW (ref 34.8–46.6)
Hemoglobin: 11.1 g/dL — ABNORMAL LOW (ref 11.6–15.9)
Lymphocytes Relative: 10 %
Lymphs Abs: 1.1 10*3/uL (ref 0.9–3.3)
MCH: 32.8 pg (ref 25.1–34.0)
MCHC: 34.4 g/dL (ref 31.5–36.0)
MCV: 95.6 fL (ref 79.5–101.0)
MONO ABS: 0.9 10*3/uL (ref 0.1–0.9)
Monocytes Relative: 8 %
Neutro Abs: 9.2 10*3/uL — ABNORMAL HIGH (ref 1.5–6.5)
Neutrophils Relative %: 82 %
PLATELETS: 441 10*3/uL — AB (ref 145–400)
RBC: 3.38 MIL/uL — AB (ref 3.70–5.45)
RDW: 13.1 % (ref 11.2–14.5)
WBC: 11.2 10*3/uL — AB (ref 3.9–10.3)

## 2018-07-21 LAB — LACTATE DEHYDROGENASE: LDH: 218 U/L — ABNORMAL HIGH (ref 98–192)

## 2018-07-22 ENCOUNTER — Ambulatory Visit
Admission: RE | Admit: 2018-07-22 | Discharge: 2018-07-22 | Disposition: A | Discharge status: Home / Self Care | Payer: Medicare Other | Source: Ambulatory Visit | Attending: Radiation Oncology | Admitting: Radiation Oncology

## 2018-07-22 DIAGNOSIS — C182 Malignant neoplasm of ascending colon: Secondary | ICD-10-CM | POA: Diagnosis not present

## 2018-07-22 DIAGNOSIS — C7951 Secondary malignant neoplasm of bone: Secondary | ICD-10-CM | POA: Diagnosis not present

## 2018-07-22 LAB — HEPATITIS B CORE ANTIBODY, TOTAL: Hep B Core Total Ab: NEGATIVE

## 2018-07-22 LAB — HEPATITIS B SURFACE ANTIGEN: Hepatitis B Surface Ag: NEGATIVE

## 2018-07-23 ENCOUNTER — Encounter: Payer: Self-pay | Admitting: Internal Medicine

## 2018-07-23 DIAGNOSIS — N183 Chronic kidney disease, stage 3 unspecified: Secondary | ICD-10-CM | POA: Insufficient documentation

## 2018-07-23 DIAGNOSIS — R630 Anorexia: Secondary | ICD-10-CM | POA: Insufficient documentation

## 2018-07-23 DIAGNOSIS — M549 Dorsalgia, unspecified: Secondary | ICD-10-CM | POA: Insufficient documentation

## 2018-07-23 DIAGNOSIS — E785 Hyperlipidemia, unspecified: Secondary | ICD-10-CM | POA: Insufficient documentation

## 2018-07-24 ENCOUNTER — Other Ambulatory Visit: Payer: Self-pay | Admitting: Oncology

## 2018-07-25 ENCOUNTER — Ambulatory Visit: Payer: Medicare Other

## 2018-07-25 ENCOUNTER — Other Ambulatory Visit: Payer: Self-pay | Admitting: Student

## 2018-07-26 ENCOUNTER — Ambulatory Visit: Payer: Medicare Other

## 2018-07-26 ENCOUNTER — Ambulatory Visit (HOSPITAL_COMMUNITY)
Admission: RE | Admit: 2018-07-26 | Discharge: 2018-07-26 | Disposition: A | Discharge status: Home / Self Care | Payer: Medicare Other | Source: Ambulatory Visit | Attending: Oncology | Admitting: Oncology

## 2018-07-26 ENCOUNTER — Encounter (HOSPITAL_COMMUNITY): Payer: Self-pay

## 2018-07-26 DIAGNOSIS — Z5309 Procedure and treatment not carried out because of other contraindication: Secondary | ICD-10-CM | POA: Diagnosis not present

## 2018-07-26 DIAGNOSIS — C8330 Diffuse large B-cell lymphoma, unspecified site: Secondary | ICD-10-CM

## 2018-07-26 DIAGNOSIS — D72829 Elevated white blood cell count, unspecified: Secondary | ICD-10-CM | POA: Insufficient documentation

## 2018-07-26 LAB — CBC
HEMATOCRIT: 33.3 % — AB (ref 36.0–46.0)
HEMOGLOBIN: 11.8 g/dL — AB (ref 12.0–15.0)
MCH: 33.3 pg (ref 26.0–34.0)
MCHC: 35.4 g/dL (ref 30.0–36.0)
MCV: 94.1 fL (ref 78.0–100.0)
Platelets: 391 10*3/uL (ref 150–400)
RBC: 3.54 MIL/uL — ABNORMAL LOW (ref 3.87–5.11)
RDW: 13 % (ref 11.5–15.5)
WBC: 16.3 10*3/uL — AB (ref 4.0–10.5)

## 2018-07-26 LAB — PROTIME-INR
INR: 0.95
Prothrombin Time: 12.6 seconds (ref 11.4–15.2)

## 2018-07-26 LAB — APTT: aPTT: 21 seconds — ABNORMAL LOW (ref 24–36)

## 2018-07-26 MED ORDER — VANCOMYCIN HCL IN DEXTROSE 1-5 GM/200ML-% IV SOLN: 1000.0000 mg | INTRAVENOUS | Status: DC

## 2018-07-26 MED ORDER — SODIUM CHLORIDE 0.9 % IV SOLN
INTRAVENOUS | Status: DC
  Administered 2018-07-26: 12:00:00 via INTRAVENOUS

## 2018-07-26 NOTE — Progress Notes (Signed)
[facility] and spoke with [place]. Report called that port placement procedure postponed due to patient eating at 10:30 AM and elevated WBC count. IR will contact them when procedure is rescheduled.

## 2018-07-26 NOTE — Progress Notes (Signed)
  Port A Cath placement had to be postponed today due to elevated WBC and patient was not NPO.  She ate breakfast at 10:30 am and her procedure was scheduled for 12:30 pm.  She also c/o "an infection" in her mouth.   I have written detailed instructions on her nursing home sheet to address any possible source of infection including her mouth/teeth. This needs to be treated prior to placement of her Port A Cath.  I also wrote instructions that she needs to be NPO after midnight for placement of a Port A Cath.  Lab Results  Component Value Date   WBC 16.3 (H) 07/26/2018   HGB 11.8 (L) 07/26/2018   HCT 33.3 (L) 07/26/2018   MCV 94.1 07/26/2018   PLT 391 07/26/2018     Orel Hord S Arshiya Jakes PA-C 07/26/2018 1:01 PM

## 2018-07-27 ENCOUNTER — Ambulatory Visit: Payer: Medicare Other

## 2018-07-27 ENCOUNTER — Encounter: Payer: Self-pay | Admitting: Pharmacist

## 2018-07-29 ENCOUNTER — Telehealth: Payer: Self-pay | Admitting: Emergency Medicine

## 2018-07-29 ENCOUNTER — Other Ambulatory Visit: Payer: Self-pay | Admitting: Oncology

## 2018-07-29 ENCOUNTER — Inpatient Hospital Stay: Payer: Medicare Other

## 2018-07-29 ENCOUNTER — Encounter: Payer: Self-pay | Admitting: Nurse Practitioner

## 2018-07-29 ENCOUNTER — Inpatient Hospital Stay (HOSPITAL_BASED_OUTPATIENT_CLINIC_OR_DEPARTMENT_OTHER): Payer: Medicare Other | Admitting: Nurse Practitioner

## 2018-07-29 VITALS — BP 127/78 | HR 89 | Temp 98.0°F | Resp 18 | Ht 63.0 in | Wt 146.4 lb

## 2018-07-29 DIAGNOSIS — Z5111 Encounter for antineoplastic chemotherapy: Secondary | ICD-10-CM | POA: Diagnosis not present

## 2018-07-29 DIAGNOSIS — L98499 Non-pressure chronic ulcer of skin of other sites with unspecified severity: Secondary | ICD-10-CM

## 2018-07-29 DIAGNOSIS — C8330 Diffuse large B-cell lymphoma, unspecified site: Secondary | ICD-10-CM

## 2018-07-29 DIAGNOSIS — D6481 Anemia due to antineoplastic chemotherapy: Secondary | ICD-10-CM | POA: Diagnosis not present

## 2018-07-29 DIAGNOSIS — B370 Candidal stomatitis: Secondary | ICD-10-CM | POA: Diagnosis not present

## 2018-07-29 DIAGNOSIS — Z5112 Encounter for antineoplastic immunotherapy: Secondary | ICD-10-CM | POA: Diagnosis not present

## 2018-07-29 DIAGNOSIS — D72829 Elevated white blood cell count, unspecified: Secondary | ICD-10-CM | POA: Diagnosis not present

## 2018-07-29 DIAGNOSIS — D701 Agranulocytosis secondary to cancer chemotherapy: Secondary | ICD-10-CM | POA: Diagnosis not present

## 2018-07-29 DIAGNOSIS — T380X5A Adverse effect of glucocorticoids and synthetic analogues, initial encounter: Secondary | ICD-10-CM | POA: Diagnosis not present

## 2018-07-29 DIAGNOSIS — C8339 Diffuse large B-cell lymphoma, extranodal and solid organ sites: Secondary | ICD-10-CM

## 2018-07-29 NOTE — Telephone Encounter (Signed)
Unable to obtain IV access today for tx. Rescheduled tx for Tuesday 8/20 along w/ PICC placement for 8/19. PET scan also scheduled for 8/28. Nurse at [facility] made aware and verbalized understanding of these appointments. Also informed nurse per LT that pt has redness around sacrum area that needs to be addressed. Nurse verbalized understanding

## 2018-07-29 NOTE — Progress Notes (Signed)
I assessed patient's veins for first time chemotherapy and she needs a port-a-cath or a PICC line. Discussed this with Ned Card, NP, and Dr. Benay Spice. Per this discussion, she will not get treated today. Patient verbalized understanding.

## 2018-07-29 NOTE — Progress Notes (Addendum)
[facility] OFFICE PROGRESS NOTE   Diagnosis: Non-Hodgkin's lymphoma  INTERVAL HISTORY:   Erica Escobar returns as scheduled.  She has noted increased back pain over the past 2 days.  Pain is at the low back and right hip.  She is ambulating with a walker, close assistance at the nursing facility. No numbness.  She feels weak in general. Appetite is poor.  She complains of a sore mouth.  No significant nausea.  Bowels are moving.    Apparently Port-A-Cath was not placed due to an elevated white count.  Objective:  Vital signs in last 24 hours:  Blood pressure 127/78, pulse 89, temperature 98 F (36.7 C), temperature source Oral, resp. rate 18, height 5' 3"  (1.6 m), weight 146 lb 6.4 oz (66.4 kg), SpO2 100 %.    HEENT: No thrush or ulcers. Resp: Lungs clear bilaterally. Cardio: Regular rate and rhythm. GI: Abdomen soft and nontender.  No hepatomegaly. Vascular: No leg edema. Neuro: Lower extremity motor strength 5/5. Musculoskeletal: Tender over the right low back.  Skin: Mild erythema over the sacrum with a small area of linear breakdown at the upper gluteal cleft.   Lab Results:  Lab Results  Component Value Date   WBC 16.3 (H) 07/26/2018   HGB 11.8 (L) 07/26/2018   HCT 33.3 (L) 07/26/2018   MCV 94.1 07/26/2018   PLT 391 07/26/2018   NEUTROABS 9.2 (H) 07/21/2018    Imaging:  No results found.  Medications: I have reviewed the patient's current medications.  Assessment/Plan: 1. 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 Loss of MLH1 and PMS2, MSI-high, BRAF mutation detected ? Colonoscopy February 2017 ? Colonoscopy 12/16/2017-3 mm polyp in the transverse colon, 10 mm polyp in the sigmoid colon. Diverticulosis in the sigmoid colon. Transverse colon polyp, tubular adenoma; sigmoid colon polyp, tubular adenoma. ? CT 07/12/2018- probable subcutaneous metastasis at the left  abdominal wall, left perinephric mass, destructive thoracic/lumbar paraspinous mass ? CT-guided biopsy of the paraspinous mass 07/13/2018  2. Anemia-likely secondary to bleeding from the colon cancer and surgery. Hemoglobin in normal range 03/20/2016, mild persistent anemia 3. Ascending colon "polyps" removed on the colonoscopy 12/22/2014 with the pathology revealing poorly differentiated adenocarcinoma and a sessile serrated adenoma polyp, similar to the pathology on the hepatic flexure mass 4. History of multiple skin cancers. Seen by dermatology every 3 months. 5. Glaucoma 6.Non-Hodgkin's lymphoma presenting with severe back pain  CT abdomen/pelvis 07/12/2018- large destructive right paraspinal chest wall mass lower thoracic/upper lumbar spine with associated bone destruction involving the right posterior elements of T9, T10 and T11, spinous processes of T8 and T9, and the posterior right 10th and 11th ribs.  Probable subcutaneous metastasis anterior left abdominal wall 2.2 cm; enlarging mass in the left perinephric space 3.0 cm in size.  CT L-spine 07/12/2018-paraspinous soft tissue mass on the right at the lower thoracic spine causing destruction of the right T11 and 12 transverse processes; may encroach on the right T11 neural foramen.  CT-guided biopsy paraspinous mass 07/13/2018- poorly differentiated malignant neoplasm; CD45, CD20, bcl-6 and bcl-2 positive.  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  Lymphoma FISH panel pending.  Radiation to paraspinous mass initiated 07/14/2018  Cycle 1 CVP/Rituxan 07/29/2018 7.Anorexia/weight loss 8.Mild elevation of calcium- question hypercalcemia of malignancy   Disposition: Erica Escobar appears unchanged.  She continues to have back pain.  The plan is to proceed with cycle 1 CVP/Rituxan today  as scheduled.  She agrees to proceed.  Port-A-Cath was not placed last week due to an elevated white count.  The white count  elevation is most likely related to steroid use.  We will try to get the Port-A-Cath rescheduled for a few days prior to her next treatment in 3 weeks.  PET scan has not been scheduled.  We will contact radiology for this to be done as soon as possible.  She has erythema and a small area of breakdown at the sacrum.  We will contact the nursing facility to request skin evaluation.  She will return for lab and follow-up on 08/08/2018.  She will contact the office in the interim with any problems.  Patient seen with Dr. Benay Spice.  25 minutes were spent face-to-face at today's visit with the majority of that time involved in counseling/coordination of care.    Ned Card ANP/GNP-BC   07/29/2018  8:54 AM  This was a shared visit with Ned Card.  Erica Escobar was interviewed and examined.  She continues to have back pain on the course of palliative radiation.  The plan is to begin CVP-rituximab therapy.  The cath placement was canceled yesterday.  We will attempt to treat with peripheral IV access today.  If we cannot obtain adequate peripheral access she will be referred for a PICC.  Julieanne Manson, MD

## 2018-08-01 ENCOUNTER — Ambulatory Visit: Payer: Medicare Other

## 2018-08-01 ENCOUNTER — Ambulatory Visit (HOSPITAL_COMMUNITY)
Admission: RE | Admit: 2018-08-01 | Discharge: 2018-08-01 | Disposition: A | Discharge status: Home / Self Care | Payer: Medicare Other | Source: Ambulatory Visit | Attending: Oncology | Admitting: Oncology

## 2018-08-01 ENCOUNTER — Encounter: Payer: Self-pay | Admitting: Radiation Oncology

## 2018-08-01 DIAGNOSIS — Z9889 Other specified postprocedural states: Secondary | ICD-10-CM | POA: Diagnosis not present

## 2018-08-01 DIAGNOSIS — Z6825 Body mass index (BMI) 25.0-25.9, adult: Secondary | ICD-10-CM | POA: Insufficient documentation

## 2018-08-01 DIAGNOSIS — Z86010 Personal history of colonic polyps: Secondary | ICD-10-CM | POA: Diagnosis not present

## 2018-08-01 DIAGNOSIS — R630 Anorexia: Secondary | ICD-10-CM | POA: Insufficient documentation

## 2018-08-01 DIAGNOSIS — D649 Anemia, unspecified: Secondary | ICD-10-CM | POA: Diagnosis not present

## 2018-08-01 DIAGNOSIS — H409 Unspecified glaucoma: Secondary | ICD-10-CM | POA: Diagnosis not present

## 2018-08-01 DIAGNOSIS — C8330 Diffuse large B-cell lymphoma, unspecified site: Secondary | ICD-10-CM | POA: Insufficient documentation

## 2018-08-01 DIAGNOSIS — Z85828 Personal history of other malignant neoplasm of skin: Secondary | ICD-10-CM | POA: Diagnosis not present

## 2018-08-01 DIAGNOSIS — Z452 Encounter for adjustment and management of vascular access device: Secondary | ICD-10-CM | POA: Diagnosis not present

## 2018-08-01 MED ORDER — LIDOCAINE HCL 1 % IJ SOLN
INTRAMUSCULAR | Status: DC | PRN
  Administered 2018-08-01: 5 mL

## 2018-08-01 MED ORDER — HEPARIN SOD (PORK) LOCK FLUSH 100 UNIT/ML IV SOLN
INTRAVENOUS | Status: AC
  Administered 2018-08-01: 500 [IU]
  Filled 2018-08-01: qty 5

## 2018-08-01 MED ORDER — LIDOCAINE HCL 1 % IJ SOLN
INTRAMUSCULAR | Status: AC
  Filled 2018-08-01: qty 20

## 2018-08-01 NOTE — Procedures (Signed)
Right single lumen brachial vein PICC placed.  Length 38 cm.  Tip SVC/right atrial junction.  Medication used-1 percent lidocaine to skin and subcutaneous tissue.  Okay to use.  No immediate complications.

## 2018-08-01 NOTE — Progress Notes (Signed)
  Radiation Oncology         [phone] ________________________________  Name: Erica Escobar MRN: [id number]  Date: 08/01/2018  DOB: 01/29/1936  End of Treatment Note  Diagnosis:   Stage II Poorly Differentiated Adenocarcinoma of the Ascending Colon, with Metastasis     Indication for treatment:  Palliative       Radiation treatment dates:   07/14/18 - 07/22/18  Site/dose:  Abdomen/ received 21 Gy in 7 fractions out of the prescribed 30 Gy in 10 fractions  Beams/energy:   3D, Photon/ 15X, 10X  Narrative: She was originally planned to have 10 treatments, but when her diagnosis came back as lymphoma, her radiation treatment coarse was shortened to allow for the start of chemotherapy. The patient tolerated radiation treatment relatively well. She reported pains in her back that radiated to her shoulder blade, some itching to the radiation site, and mild fatigue. She denied nausea, vomiting, bowel issues, and dysuria or hematuria. She noted improved appetite.   Plan: The patient has completed radiation treatment. The patient will return to radiation oncology clinic for routine followup in one month. I advised them to call or return sooner if they have any questions or concerns related to their recovery or treatment.  -----------------------------------  Blair Promise, PhD, MD  This document serves as a record of services personally performed by Gery Pray, MD. It was created on his behalf by Wilburn Mylar, a trained medical scribe. The creation of this record is based on the scribe's personal observations and the provider's statements to them. This document has been checked and approved by the attending provider.

## 2018-08-02 ENCOUNTER — Inpatient Hospital Stay: Payer: Medicare Other

## 2018-08-02 ENCOUNTER — Encounter (HOSPITAL_COMMUNITY): Payer: Self-pay

## 2018-08-02 VITALS — BP 125/80 | HR 81 | Temp 98.2°F | Resp 18

## 2018-08-02 DIAGNOSIS — Z5111 Encounter for antineoplastic chemotherapy: Secondary | ICD-10-CM | POA: Diagnosis not present

## 2018-08-02 DIAGNOSIS — L98499 Non-pressure chronic ulcer of skin of other sites with unspecified severity: Secondary | ICD-10-CM | POA: Diagnosis not present

## 2018-08-02 DIAGNOSIS — C8330 Diffuse large B-cell lymphoma, unspecified site: Secondary | ICD-10-CM

## 2018-08-02 DIAGNOSIS — C8580 Other specified types of non-Hodgkin lymphoma, unspecified site: Secondary | ICD-10-CM

## 2018-08-02 DIAGNOSIS — C8339 Diffuse large B-cell lymphoma, extranodal and solid organ sites: Secondary | ICD-10-CM | POA: Diagnosis not present

## 2018-08-02 DIAGNOSIS — B370 Candidal stomatitis: Secondary | ICD-10-CM | POA: Diagnosis not present

## 2018-08-02 DIAGNOSIS — D6481 Anemia due to antineoplastic chemotherapy: Secondary | ICD-10-CM | POA: Diagnosis not present

## 2018-08-02 DIAGNOSIS — T380X5A Adverse effect of glucocorticoids and synthetic analogues, initial encounter: Secondary | ICD-10-CM | POA: Diagnosis not present

## 2018-08-02 DIAGNOSIS — Z5112 Encounter for antineoplastic immunotherapy: Secondary | ICD-10-CM | POA: Diagnosis not present

## 2018-08-02 DIAGNOSIS — D701 Agranulocytosis secondary to cancer chemotherapy: Secondary | ICD-10-CM | POA: Diagnosis not present

## 2018-08-02 DIAGNOSIS — D72829 Elevated white blood cell count, unspecified: Secondary | ICD-10-CM | POA: Diagnosis not present

## 2018-08-02 LAB — CMP (CANCER CENTER ONLY)
ALK PHOS: 47 U/L (ref 38–126)
ALT: 11 U/L (ref 0–44)
ANION GAP: 9 (ref 5–15)
AST: 9 U/L — ABNORMAL LOW (ref 15–41)
Albumin: 2.7 g/dL — ABNORMAL LOW (ref 3.5–5.0)
BILIRUBIN TOTAL: 0.5 mg/dL (ref 0.3–1.2)
BUN: 37 mg/dL — ABNORMAL HIGH (ref 8–23)
CALCIUM: 8.4 mg/dL — AB (ref 8.9–10.3)
CO2: 28 mmol/L (ref 22–32)
Chloride: 104 mmol/L (ref 98–111)
Creatinine: 1 mg/dL (ref 0.44–1.00)
GFR, Estimated: 52 mL/min — ABNORMAL LOW (ref >60)
Glucose, Bld: 90 mg/dL (ref 70–99)
POTASSIUM: 4 mmol/L (ref 3.5–5.1)
Sodium: 141 mmol/L (ref 135–145)
TOTAL PROTEIN: 5.4 g/dL — AB (ref 6.5–8.1)

## 2018-08-02 LAB — URIC ACID: Uric Acid, Serum: 3.8 mg/dL (ref 2.5–7.1)

## 2018-08-02 MED ORDER — PALONOSETRON HCL INJECTION 0.25 MG/5ML
INTRAVENOUS | Status: AC
  Filled 2018-08-02: qty 5

## 2018-08-02 MED ORDER — ACETAMINOPHEN 325 MG PO TABS
650.0000 mg | ORAL_TABLET | Freq: Once | ORAL | Status: AC
  Administered 2018-08-02: 650 mg via ORAL

## 2018-08-02 MED ORDER — DEXAMETHASONE SODIUM PHOSPHATE 10 MG/ML IJ SOLN
10.0000 mg | Freq: Once | INTRAMUSCULAR | Status: AC
  Administered 2018-08-02: 10 mg via INTRAVENOUS

## 2018-08-02 MED ORDER — SODIUM CHLORIDE 0.9 % IV SOLN
Freq: Once | INTRAVENOUS | Status: AC
  Administered 2018-08-02: 09:00:00 via INTRAVENOUS
  Filled 2018-08-02: qty 250

## 2018-08-02 MED ORDER — SODIUM CHLORIDE 0.9% FLUSH
10.0000 mL | INTRAVENOUS | Status: DC | PRN
  Administered 2018-08-02: 10 mL
  Filled 2018-08-02: qty 10

## 2018-08-02 MED ORDER — DIPHENHYDRAMINE HCL 25 MG PO CAPS
25.0000 mg | ORAL_CAPSULE | Freq: Once | ORAL | Status: AC
  Administered 2018-08-02: 25 mg via ORAL

## 2018-08-02 MED ORDER — DEXAMETHASONE SODIUM PHOSPHATE 10 MG/ML IJ SOLN
INTRAMUSCULAR | Status: AC
  Filled 2018-08-02: qty 1

## 2018-08-02 MED ORDER — SODIUM CHLORIDE 0.9 % IV SOLN
750.0000 mg/m2 | Freq: Once | INTRAVENOUS | Status: AC
  Administered 2018-08-02: 1300 mg via INTRAVENOUS
  Filled 2018-08-02: qty 65

## 2018-08-02 MED ORDER — PALONOSETRON HCL INJECTION 0.25 MG/5ML
0.2500 mg | Freq: Once | INTRAVENOUS | Status: AC
  Administered 2018-08-02: 0.25 mg via INTRAVENOUS

## 2018-08-02 MED ORDER — VINCRISTINE SULFATE CHEMO INJECTION 1 MG/ML
1.0000 mg | Freq: Once | INTRAVENOUS | Status: AC
  Administered 2018-08-02: 1 mg via INTRAVENOUS
  Filled 2018-08-02: qty 1

## 2018-08-02 MED ORDER — ACETAMINOPHEN 325 MG PO TABS
ORAL_TABLET | ORAL | Status: AC
  Filled 2018-08-02: qty 2

## 2018-08-02 MED ORDER — SODIUM CHLORIDE 0.9 % IV SOLN
375.0000 mg/m2 | Freq: Once | INTRAVENOUS | Status: AC
  Administered 2018-08-02: 700 mg via INTRAVENOUS
  Filled 2018-08-02: qty 50

## 2018-08-02 MED ORDER — HEPARIN SOD (PORK) LOCK FLUSH 100 UNIT/ML IV SOLN
250.0000 [IU] | Freq: Once | INTRAVENOUS | Status: AC | PRN
  Administered 2018-08-02: 250 [IU]
  Filled 2018-08-02: qty 5

## 2018-08-02 MED ORDER — DIPHENHYDRAMINE HCL 25 MG PO CAPS
ORAL_CAPSULE | ORAL | Status: AC
  Filled 2018-08-02: qty 1

## 2018-08-02 NOTE — Progress Notes (Signed)
Per Dr. Benay Spice, ok to use CBC and CMP labs from 07/21/18.

## 2018-08-02 NOTE — Patient Instructions (Signed)
[facility] Discharge Instructions for Patients Receiving Chemotherapy  Today you received the following chemotherapy agents vincristine (Oncovin), cyclophosphamide (Cytoxan), and rituximab (Rituxan)  To help prevent nausea and vomiting after your treatment, we encourage you to take your nausea medication as directed by your doctor.   If you develop nausea and vomiting that is not controlled by your nausea medication, call the clinic.   BELOW ARE SYMPTOMS THAT SHOULD BE REPORTED IMMEDIATELY:  *FEVER GREATER THAN 100.5 F  *CHILLS WITH OR WITHOUT FEVER  NAUSEA AND VOMITING THAT IS NOT CONTROLLED WITH YOUR NAUSEA MEDICATION  *UNUSUAL SHORTNESS OF BREATH  *UNUSUAL BRUISING OR BLEEDING  TENDERNESS IN MOUTH AND THROAT WITH OR WITHOUT PRESENCE OF ULCERS  *URINARY PROBLEMS  *BOWEL PROBLEMS  UNUSUAL RASH Items with * indicate a potential emergency and should be followed up as soon as possible.  Feel free to call the clinic should you have any questions or concerns. The clinic phone number is [phone].  Please show the [place] at check-in to the Emergency Department and triage nurse.  Vincristine injection What is this medicine? VINCRISTINE (vin KRIS teen) is a chemotherapy drug. It slows the growth of cancer cells. This medicine is used to treat many types of cancer like Hodgkin's disease, leukemia, non-Hodgkin's lymphoma, neuroblastoma (brain cancer), rhabdomyosarcoma, and Wilms' tumor. This medicine may be used for other purposes; ask your health care provider or pharmacist if you have questions. COMMON BRAND NAME(S): Oncovin, Vincasar PFS What should I tell my health care provider before I take this medicine? They need to know if you have any of these conditions: -blood disorders -gout -infection (especially chickenpox, cold sores, or herpes) -kidney disease -liver disease -lung disease -nervous system disease like Charcot-Marie-Tooth  (CMT) -recent or ongoing radiation therapy -an unusual or allergic reaction to vincristine, other chemotherapy agents, other medicines, foods, dyes, or preservatives -pregnant or trying to get pregnant -breast-feeding How should I use this medicine? This drug is given as an infusion into a vein. It is administered in a hospital or clinic by a specially trained health care professional. If you have pain, swelling, burning, or any unusual feeling around the site of your injection, tell your health care professional right away. Talk to your pediatrician regarding the use of this medicine in children. While this drug may be prescribed for selected conditions, precautions do apply. Overdosage: If you think you have taken too much of this medicine contact a poison control center or emergency room at once. NOTE: This medicine is only for you. Do not share this medicine with others. What if I miss a dose? It is important not to miss your dose. Call your doctor or health care professional if you are unable to keep an appointment. What may interact with this medicine? Do not take this medicine with any of the following medications: -itraconazole -mibefradil -voriconazole This medicine may also interact with the following medications: -cyclosporine -erythromycin -fluconazole -ketoconazole -medicines for HIV like delavirdine, efavirenz, nevirapine -medicines for seizures like ethotoin, fosphenotoin, phenytoin -medicines to increase blood counts like filgrastim, pegfilgrastim, sargramostim -other chemotherapy drugs like cisplatin, L-asparaginase, methotrexate, mitomycin, paclitaxel -pegaspargase -vaccines -zalcitabine, ddC Talk to your doctor or health care professional before taking any of these medicines: -acetaminophen -aspirin -ibuprofen -ketoprofen -naproxen This list may not describe all possible interactions. Give your health care provider a list of all the medicines, herbs,  non-prescription drugs, or dietary supplements you use. Also tell them if you smoke, drink alcohol, or use illegal  drugs. Some items may interact with your medicine. What should I watch for while using this medicine? Your condition will be monitored carefully while you are receiving this medicine. You will need important blood work done while you are taking this medicine. This drug may make you feel generally unwell. This is not uncommon, as chemotherapy can affect healthy cells as well as cancer cells. Report any side effects. Continue your course of treatment even though you feel ill unless your doctor tells you to stop. In some cases, you may be given additional medicines to help with side effects. Follow all directions for their use. Call your doctor or health care professional for advice if you get a fever, chills or sore throat, or other symptoms of a cold or flu. Do not treat yourself. Avoid taking products that contain aspirin, acetaminophen, ibuprofen, naproxen, or ketoprofen unless instructed by your doctor. These medicines may hide a fever. Do not become pregnant while taking this medicine. Women should inform their doctor if they wish to become pregnant or think they might be pregnant. There is a potential for serious side effects to an unborn child. Talk to your health care professional or pharmacist for more information. Do not breast-feed an infant while taking this medicine. Men may have a lower sperm count while taking this medicine. Talk to your doctor if you plan to father a child. What side effects may I notice from receiving this medicine? Side effects that you should report to your doctor or health care professional as soon as possible: -allergic reactions like skin rash, itching or hives, swelling of the face, lips, or tongue -breathing problems -confusion or changes in emotions or moods -constipation -cough -mouth sores -muscle weakness -nausea and vomiting -pain, swelling,  redness or irritation at the injection site -pain, tingling, numbness in the hands or feet -problems with balance, talking, walking -seizures -stomach pain -trouble passing urine or change in the amount of urine Side effects that usually do not require medical attention (report to your doctor or health care professional if they continue or are bothersome): -diarrhea -hair loss -jaw pain -loss of appetite This list may not describe all possible side effects. Call your doctor for medical advice about side effects. You may report side effects to FDA at 1-800-FDA-1088. Where should I keep my medicine? This drug is given in a hospital or clinic and will not be stored at home. NOTE: This sheet is a summary. It may not cover all possible information. If you have questions about this medicine, talk to your doctor, pharmacist, or health care provider.  2018 Elsevier/Gold Standard (2008-08-27 17:17:13)  Cyclophosphamide injection What is this medicine? CYCLOPHOSPHAMIDE (sye kloe FOSS fa mide) is a chemotherapy drug. It slows the growth of cancer cells. This medicine is used to treat many types of cancer like lymphoma, myeloma, leukemia, breast cancer, and ovarian cancer, to name a few. This medicine may be used for other purposes; ask your health care provider or pharmacist if you have questions. COMMON BRAND NAME(S): Cytoxan, Neosar What should I tell my health care provider before I take this medicine? They need to know if you have any of these conditions: -blood disorders -history of other chemotherapy -infection -kidney disease -liver disease -recent or ongoing radiation therapy -tumors in the bone marrow -an unusual or allergic reaction to cyclophosphamide, other chemotherapy, other medicines, foods, dyes, or preservatives -pregnant or trying to get pregnant -breast-feeding How should I use this medicine? This drug is usually given as an injection  into a vein or muscle or by infusion  into a vein. It is administered in a hospital or clinic by a specially trained health care professional. Talk to your pediatrician regarding the use of this medicine in children. Special care may be needed. Overdosage: If you think you have taken too much of this medicine contact a poison control center or emergency room at once. NOTE: This medicine is only for you. Do not share this medicine with others. What if I miss a dose? It is important not to miss your dose. Call your doctor or health care professional if you are unable to keep an appointment. What may interact with this medicine? This medicine may interact with the following medications: -amiodarone -amphotericin B -azathioprine -certain antiviral medicines for HIV or AIDS such as protease inhibitors (e.g., indinavir, ritonavir) and zidovudine -certain blood pressure medications such as benazepril, captopril, enalapril, fosinopril, lisinopril, moexipril, monopril, perindopril, quinapril, ramipril, trandolapril -certain cancer medications such as anthracyclines (e.g., daunorubicin, doxorubicin), busulfan, cytarabine, paclitaxel, pentostatin, tamoxifen, trastuzumab -certain diuretics such as chlorothiazide, chlorthalidone, hydrochlorothiazide, indapamide, metolazone -certain medicines that treat or prevent blood clots like warfarin -certain muscle relaxants such as succinylcholine -cyclosporine -etanercept -indomethacin -medicines to increase blood counts like filgrastim, pegfilgrastim, sargramostim -medicines used as general anesthesia -metronidazole -natalizumab This list may not describe all possible interactions. Give your health care provider a list of all the medicines, herbs, non-prescription drugs, or dietary supplements you use. Also tell them if you smoke, drink alcohol, or use illegal drugs. Some items may interact with your medicine. What should I watch for while using this medicine? Visit your doctor for checks on your  progress. This drug may make you feel generally unwell. This is not uncommon, as chemotherapy can affect healthy cells as well as cancer cells. Report any side effects. Continue your course of treatment even though you feel ill unless your doctor tells you to stop. Drink water or other fluids as directed. Urinate often, even at night. In some cases, you may be given additional medicines to help with side effects. Follow all directions for their use. Call your doctor or health care professional for advice if you get a fever, chills or sore throat, or other symptoms of a cold or flu. Do not treat yourself. This drug decreases your body's ability to fight infections. Try to avoid being around people who are sick. This medicine may increase your risk to bruise or bleed. Call your doctor or health care professional if you notice any unusual bleeding. Be careful brushing and flossing your teeth or using a toothpick because you may get an infection or bleed more easily. If you have any dental work done, tell your dentist you are receiving this medicine. You may get drowsy or dizzy. Do not drive, use machinery, or do anything that needs mental alertness until you know how this medicine affects you. Do not become pregnant while taking this medicine or for 1 year after stopping it. Women should inform their doctor if they wish to become pregnant or think they might be pregnant. Men should not father a child while taking this medicine and for 4 months after stopping it. There is a potential for serious side effects to an unborn child. Talk to your health care professional or pharmacist for more information. Do not breast-feed an infant while taking this medicine. This medicine may interfere with the ability to have a child. This medicine has caused ovarian failure in some women. This medicine has caused reduced sperm counts  in some men. You should talk with your doctor or health care professional if you are concerned  about your fertility. If you are going to have surgery, tell your doctor or health care professional that you have taken this medicine. What side effects may I notice from receiving this medicine? Side effects that you should report to your doctor or health care professional as soon as possible: -allergic reactions like skin rash, itching or hives, swelling of the face, lips, or tongue -low blood counts - this medicine may decrease the number of white blood cells, red blood cells and platelets. You may be at increased risk for infections and bleeding. -signs of infection - fever or chills, cough, sore throat, pain or difficulty passing urine -signs of decreased platelets or bleeding - bruising, pinpoint red spots on the skin, black, tarry stools, blood in the urine -signs of decreased red blood cells - unusually weak or tired, fainting spells, lightheadedness -breathing problems -dark urine -dizziness -palpitations -swelling of the ankles, feet, hands -trouble passing urine or change in the amount of urine -weight gain -yellowing of the eyes or skin Side effects that usually do not require medical attention (report to your doctor or health care professional if they continue or are bothersome): -changes in nail or skin color -hair loss -missed menstrual periods -mouth sores -nausea, vomiting This list may not describe all possible side effects. Call your doctor for medical advice about side effects. You may report side effects to FDA at 1-800-FDA-1088. Where should I keep my medicine? This drug is given in a hospital or clinic and will not be stored at home. NOTE: This sheet is a summary. It may not cover all possible information. If you have questions about this medicine, talk to your doctor, pharmacist, or health care provider.  2018 Elsevier/Gold Standard (2012-10-14 16:22:58)  Rituximab injection What is this medicine? RITUXIMAB (ri TUX i mab) is a monoclonal antibody. It is used  to treat certain types of cancer like non-Hodgkin lymphoma and chronic lymphocytic leukemia. It is also used to treat rheumatoid arthritis, granulomatosis with polyangiitis (or Wegener's granulomatosis), and microscopic polyangiitis. This medicine may be used for other purposes; ask your health care provider or pharmacist if you have questions. COMMON BRAND NAME(S): Rituxan What should I tell my health care provider before I take this medicine? They need to know if you have any of these conditions: -heart disease -infection (especially a virus infection such as hepatitis B, chickenpox, cold sores, or herpes) -immune system problems -irregular heartbeat -kidney disease -lung or breathing disease, like asthma -recently received or scheduled to receive a vaccine -an unusual or allergic reaction to rituximab, mouse proteins, other medicines, foods, dyes, or preservatives -pregnant or trying to get pregnant -breast-feeding How should I use this medicine? This medicine is for infusion into a vein. It is administered in a hospital or clinic by a specially trained health care professional. A special MedGuide will be given to you by the pharmacist with each prescription and refill. Be sure to read this information carefully each time. Talk to your pediatrician regarding the use of this medicine in children. This medicine is not approved for use in children. Overdosage: If you think you have taken too much of this medicine contact a poison control center or emergency room at once. NOTE: This medicine is only for you. Do not share this medicine with others. What if I miss a dose? It is important not to miss a dose. Call your doctor or health  care professional if you are unable to keep an appointment. What may interact with this medicine? -cisplatin -other medicines for arthritis like disease modifying antirheumatic drugs or tumor necrosis factor inhibitors -live virus vaccines This list may not  describe all possible interactions. Give your health care provider a list of all the medicines, herbs, non-prescription drugs, or dietary supplements you use. Also tell them if you smoke, drink alcohol, or use illegal drugs. Some items may interact with your medicine. What should I watch for while using this medicine? Your condition will be monitored carefully while you are receiving this medicine. You may need blood work done while you are taking this medicine. This medicine can cause serious allergic reactions. To reduce your risk you may need to take medicine before treatment with this medicine. Take your medicine as directed. In some patients, this medicine may cause a serious brain infection that may cause death. If you have any problems seeing, thinking, speaking, walking, or standing, tell your doctor right away. If you cannot reach your doctor, urgently seek other source of medical care. Call your doctor or health care professional for advice if you get a fever, chills or sore throat, or other symptoms of a cold or flu. Do not treat yourself. This drug decreases your body's ability to fight infections. Try to avoid being around people who are sick. Do not become pregnant while taking this medicine or for 12 months after stopping it. Women should inform their doctor if they wish to become pregnant or think they might be pregnant. There is a potential for serious side effects to an unborn child. Talk to your health care professional or pharmacist for more information. What side effects may I notice from receiving this medicine? Side effects that you should report to your doctor or health care professional as soon as possible: -breathing problems -chest pain -dizziness or feeling faint -fast, irregular heartbeat -low blood counts - this medicine may decrease the number of white blood cells, red blood cells and platelets. You may be at increased risk for infections and bleeding. -mouth  sores -redness, blistering, peeling or loosening of the skin, including inside the mouth (this can be added for any serious or exfoliative rash that could lead to hospitalization) -signs of infection - fever or chills, cough, sore throat, pain or difficulty passing urine -signs and symptoms of kidney injury like trouble passing urine or change in the amount of urine -signs and symptoms of liver injury like dark yellow or brown urine; general ill feeling or flu-like symptoms; light-colored stools; loss of appetite; nausea; right upper belly pain; unusually weak or tired; yellowing of the eyes or skin -stomach pain -vomiting Side effects that usually do not require medical attention (report to your doctor or health care professional if they continue or are bothersome): -headache -joint pain -muscle cramps or muscle pain This list may not describe all possible side effects. Call your doctor for medical advice about side effects. You may report side effects to FDA at 1-800-FDA-1088. Where should I keep my medicine? This drug is given in a hospital or clinic and will not be stored at home. NOTE: This sheet is a summary. It may not cover all possible information. If you have questions about this medicine, talk to your doctor, pharmacist, or health care provider.  2018 Elsevier/Gold Standard (2016-07-08 15:28:09)

## 2018-08-03 ENCOUNTER — Telehealth: Payer: Self-pay | Admitting: Nurse Practitioner

## 2018-08-03 NOTE — Telephone Encounter (Signed)
vmail not set up for mobile - left message on home line with nest set of appts date and times ( 8/26 & 9/5,9/6)  - sent reminder letter in the mail with appt date and time.

## 2018-08-08 ENCOUNTER — Telehealth: Payer: Self-pay | Admitting: Emergency Medicine

## 2018-08-08 ENCOUNTER — Inpatient Hospital Stay: Payer: Medicare Other | Admitting: Nurse Practitioner

## 2018-08-08 ENCOUNTER — Inpatient Hospital Stay: Payer: Medicare Other

## 2018-08-08 NOTE — Telephone Encounter (Signed)
Spoke with patients nurse at [facility]. Nurse is aware of PET scan as well as new added appointments. Pt will see Dr.sherrill before scan w/ labs & flush. Also made nurse aware of September 9th appointments. She verbalized understanding.

## 2018-08-09 ENCOUNTER — Encounter (HOSPITAL_COMMUNITY): Payer: Self-pay | Admitting: Oncology

## 2018-08-09 ENCOUNTER — Telehealth: Payer: Self-pay | Admitting: Emergency Medicine

## 2018-08-09 NOTE — Telephone Encounter (Signed)
Patients daughter in law called stating that patients rehab facility is planning to discharge the patient tomorrow and that [place] lives alone but is unable to care for her self. This nurse instructed the patients daughter in law to speak with the rehab doctor / social workers regarding this. She voiced understanding. I also told her of upcoming appointments and expressed that the patient not miss them if at all possible and offered for family to come to the appointments with the patient to discuss this with Dr.Sherrill. She voiced understanding but states she can not miss work.

## 2018-08-10 ENCOUNTER — Non-Acute Institutional Stay (SKILLED_NURSING_FACILITY): Payer: Medicare Other | Admitting: Internal Medicine

## 2018-08-10 ENCOUNTER — Telehealth: Payer: Self-pay

## 2018-08-10 ENCOUNTER — Telehealth: Payer: Self-pay | Admitting: Nurse Practitioner

## 2018-08-10 ENCOUNTER — Encounter: Payer: Self-pay | Admitting: Internal Medicine

## 2018-08-10 ENCOUNTER — Inpatient Hospital Stay: Payer: Medicare Other

## 2018-08-10 ENCOUNTER — Inpatient Hospital Stay (HOSPITAL_BASED_OUTPATIENT_CLINIC_OR_DEPARTMENT_OTHER): Payer: Medicare Other | Admitting: Nurse Practitioner

## 2018-08-10 ENCOUNTER — Ambulatory Visit (HOSPITAL_COMMUNITY)
Admission: RE | Admit: 2018-08-10 | Discharge: 2018-08-10 | Disposition: A | Discharge status: Home / Self Care | Payer: Medicare Other | Source: Ambulatory Visit | Attending: Nurse Practitioner | Admitting: Nurse Practitioner

## 2018-08-10 ENCOUNTER — Encounter: Payer: Self-pay | Admitting: Nurse Practitioner

## 2018-08-10 VITALS — BP 109/65 | HR 92 | Temp 98.6°F | Resp 18 | Ht 63.0 in

## 2018-08-10 DIAGNOSIS — Z5111 Encounter for antineoplastic chemotherapy: Secondary | ICD-10-CM | POA: Diagnosis not present

## 2018-08-10 DIAGNOSIS — C799 Secondary malignant neoplasm of unspecified site: Secondary | ICD-10-CM | POA: Diagnosis not present

## 2018-08-10 DIAGNOSIS — F329 Major depressive disorder, single episode, unspecified: Secondary | ICD-10-CM

## 2018-08-10 DIAGNOSIS — G2581 Restless legs syndrome: Secondary | ICD-10-CM

## 2018-08-10 DIAGNOSIS — N183 Chronic kidney disease, stage 3 unspecified: Secondary | ICD-10-CM

## 2018-08-10 DIAGNOSIS — C8330 Diffuse large B-cell lymphoma, unspecified site: Secondary | ICD-10-CM

## 2018-08-10 DIAGNOSIS — L98499 Non-pressure chronic ulcer of skin of other sites with unspecified severity: Secondary | ICD-10-CM

## 2018-08-10 DIAGNOSIS — G8929 Other chronic pain: Secondary | ICD-10-CM

## 2018-08-10 DIAGNOSIS — M8448XA Pathological fracture, other site, initial encounter for fracture: Secondary | ICD-10-CM

## 2018-08-10 DIAGNOSIS — K219 Gastro-esophageal reflux disease without esophagitis: Secondary | ICD-10-CM

## 2018-08-10 DIAGNOSIS — E785 Hyperlipidemia, unspecified: Secondary | ICD-10-CM

## 2018-08-10 DIAGNOSIS — R222 Localized swelling, mass and lump, trunk: Secondary | ICD-10-CM | POA: Diagnosis not present

## 2018-08-10 DIAGNOSIS — D72829 Elevated white blood cell count, unspecified: Secondary | ICD-10-CM | POA: Diagnosis not present

## 2018-08-10 DIAGNOSIS — B370 Candidal stomatitis: Secondary | ICD-10-CM | POA: Diagnosis not present

## 2018-08-10 DIAGNOSIS — D701 Agranulocytosis secondary to cancer chemotherapy: Secondary | ICD-10-CM | POA: Diagnosis not present

## 2018-08-10 DIAGNOSIS — R630 Anorexia: Secondary | ICD-10-CM

## 2018-08-10 DIAGNOSIS — C8339 Diffuse large B-cell lymphoma, extranodal and solid organ sites: Secondary | ICD-10-CM | POA: Diagnosis not present

## 2018-08-10 DIAGNOSIS — C8580 Other specified types of non-Hodgkin lymphoma, unspecified site: Secondary | ICD-10-CM

## 2018-08-10 DIAGNOSIS — D6481 Anemia due to antineoplastic chemotherapy: Secondary | ICD-10-CM | POA: Diagnosis not present

## 2018-08-10 DIAGNOSIS — Z5112 Encounter for antineoplastic immunotherapy: Secondary | ICD-10-CM | POA: Diagnosis not present

## 2018-08-10 DIAGNOSIS — M545 Low back pain: Secondary | ICD-10-CM

## 2018-08-10 DIAGNOSIS — T380X5A Adverse effect of glucocorticoids and synthetic analogues, initial encounter: Secondary | ICD-10-CM | POA: Diagnosis not present

## 2018-08-10 DIAGNOSIS — F32A Depression, unspecified: Secondary | ICD-10-CM

## 2018-08-10 DIAGNOSIS — C182 Malignant neoplasm of ascending colon: Secondary | ICD-10-CM

## 2018-08-10 LAB — CBC WITH DIFFERENTIAL (CANCER CENTER ONLY)
BASOS ABS: 0 10*3/uL (ref 0.0–0.1)
BASOS PCT: 0 %
EOS ABS: 0 10*3/uL (ref 0.0–0.5)
Eosinophils Relative: 0 %
HEMATOCRIT: 28.9 % — AB (ref 34.8–46.6)
HEMOGLOBIN: 7.8 g/dL — AB (ref 11.6–15.9)
Lymphocytes Relative: 19 %
Lymphs Abs: 0.1 10*3/uL — ABNORMAL LOW (ref 0.9–3.3)
MCH: 31.8 pg (ref 25.1–34.0)
MCHC: 31.8 g/dL (ref 31.5–36.0)
MCV: 91.8 fL (ref 79.5–101.0)
MONOS PCT: 13 %
Monocytes Absolute: 0 10*3/uL — ABNORMAL LOW (ref 0.1–0.9)
NEUTROS ABS: 0.2 10*3/uL — AB (ref 1.5–6.5)
NEUTROS PCT: 68 %
Platelet Count: 162 10*3/uL (ref 145–400)
RBC: 2.41 MIL/uL — ABNORMAL LOW (ref 3.70–5.45)
RDW: 15 % — ABNORMAL HIGH (ref 11.2–14.5)
WBC Count: 0.3 10*3/uL — CL (ref 3.9–10.3)

## 2018-08-10 LAB — CMP (CANCER CENTER ONLY)
ALBUMIN: 2.6 g/dL — AB (ref 3.5–5.0)
ALK PHOS: 79 U/L (ref 38–126)
ALT: 10 U/L (ref 0–44)
ANION GAP: 13 (ref 5–15)
AST: 9 U/L — ABNORMAL LOW (ref 15–41)
BUN: 38 mg/dL — ABNORMAL HIGH (ref 8–23)
CALCIUM: 8.9 mg/dL (ref 8.9–10.3)
CO2: 29 mmol/L (ref 22–32)
Chloride: 96 mmol/L — ABNORMAL LOW (ref 98–111)
Creatinine: 1.36 mg/dL — ABNORMAL HIGH (ref 0.44–1.00)
GFR, EST AFRICAN AMERICAN: 41 mL/min — AB (ref >60)
GFR, Estimated: 36 mL/min — ABNORMAL LOW (ref >60)
GLUCOSE: 159 mg/dL — AB (ref 70–99)
POTASSIUM: 3.8 mmol/L (ref 3.5–5.1)
SODIUM: 138 mmol/L (ref 135–145)
TOTAL PROTEIN: 6 g/dL — AB (ref 6.5–8.1)
Total Bilirubin: 1 mg/dL (ref 0.3–1.2)

## 2018-08-10 LAB — GLUCOSE, CAPILLARY: Glucose-Capillary: 115 mg/dL — ABNORMAL HIGH (ref 70–99)

## 2018-08-10 LAB — URIC ACID: Uric Acid, Serum: 7.3 mg/dL — ABNORMAL HIGH (ref 2.5–7.1)

## 2018-08-10 IMAGING — PT NM PET TUM IMG INITIAL (PI) SKULL BASE T - THIGH
2 series · 6 of 6 positions shown · non-contrast
Comparison: CT 07/12/2018

CLINICAL DATA: Initial treatment strategy for diffuse large B-cell
lymphoma..

EXAM:
NUCLEAR MEDICINE PET SKULL BASE TO THIGH
TECHNIQUE: 7.3 mCi F-18 FDG was injected intravenously. Full-ring PET imaging
was performed from the skull base to thigh after the radiotracer. CT
data was obtained and used for attenuation correction and anatomic
localization.
Fasting blood glucose: 115 mg/dl

[Series 7: pet statistics · 1 of 1 slices shown]
[im 1/1]
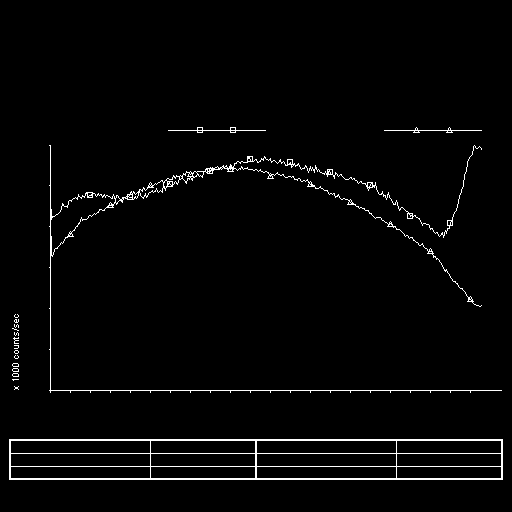

[Series 1068: results mm oncology reading · 0.9mm · 0.82mm/px · 5 of 5 slices shown]
[im 1/5]
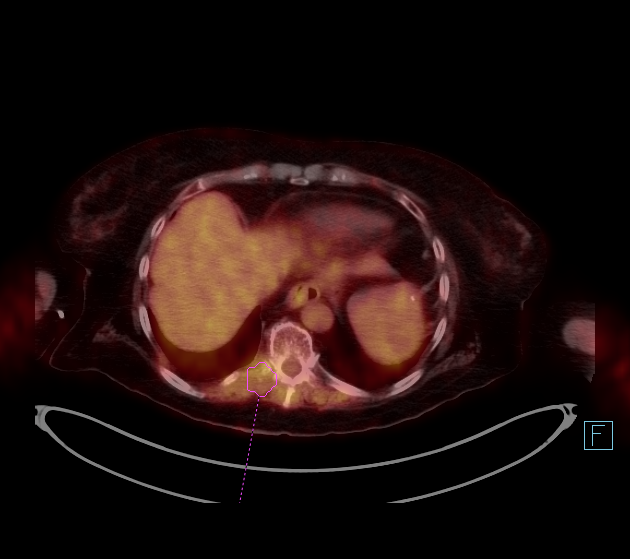
[im 2/5]
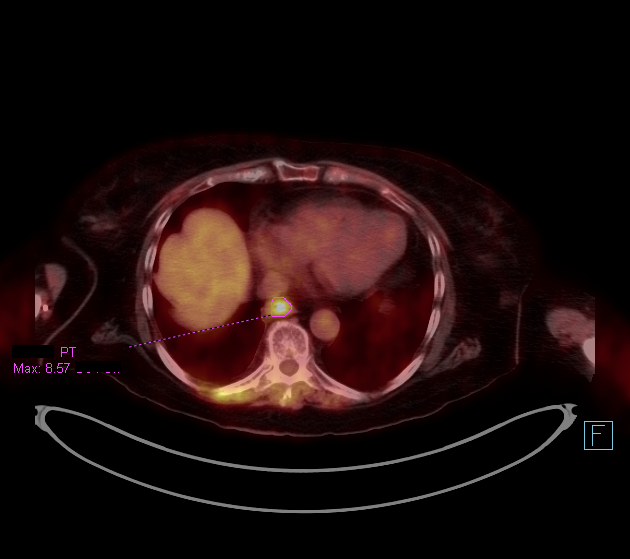
[im 3/5]
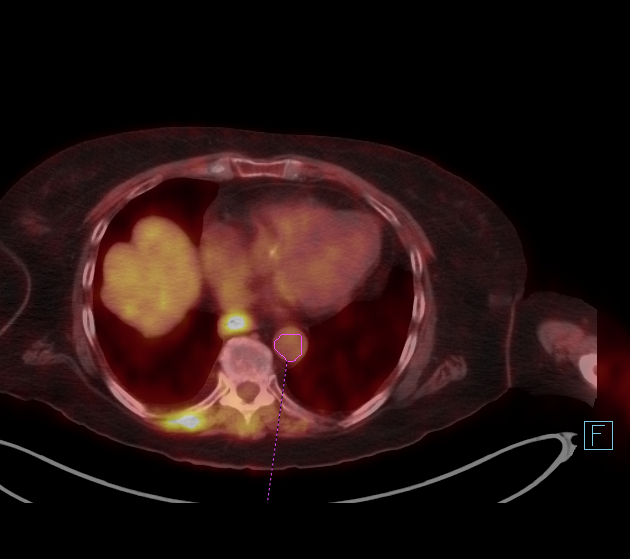
[im 4/5]
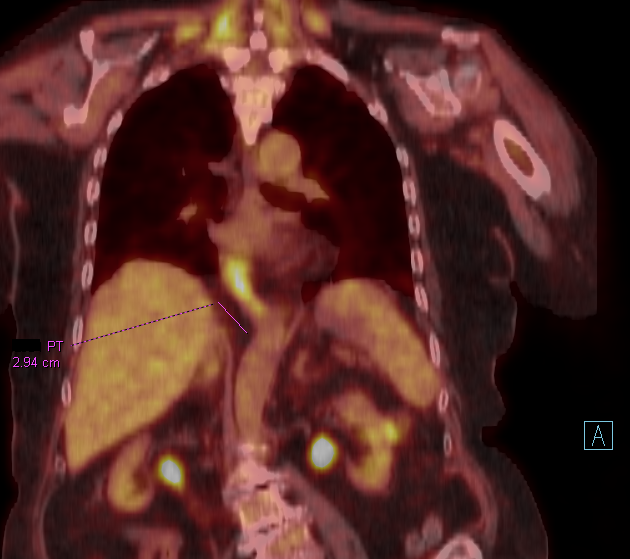
[im 5/5]
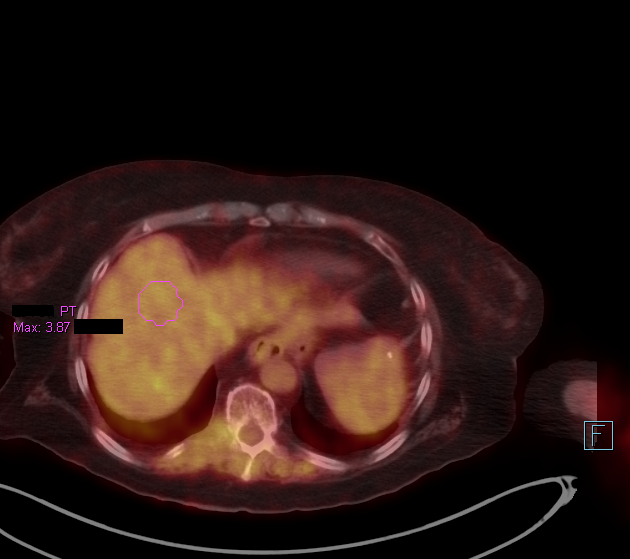

[6 of 6 positions shown; findings below may reference images not displayed]

FINDINGS: Mediastinal blood pool activity: SUV max

NECK: No hypermetabolic lymph nodes in the neck.

Incidental CT findings: none

CHEST: No hypermetabolic mediastinal or hilar nodes. No suspicious
pulmonary nodules on the CT scan.

Short segment of hypermetabolic activity in the distal esophagus
approximately 3 cm above the GE junction. No additional abnormal
activity esophagus. No mass lesion or obstruction identified on the
CT portion exam

Incidental CT findings: RIGHT PICC line noted.

ABDOMEN/PELVIS: No abnormal hypermetabolic activity within the
liver, pancreas, adrenal glands, or spleen. No hypermetabolic lymph
nodes in the abdomen or pelvis.

RIGHT hemicolectomy. Metabolic activity associated with hustral
contraction in the transverse colon is favored benign.

Incidental CT findings: Post hysterectomy

SKELETON: The RIGHT paraspinal mass at the T10 - T12 vertebral body
level is decreased significantly in size measuring 3.3 cm in
thickness compared to 5.4 cm in thickness on comparison exam. The
activity of through this region is low with SUV max equal
(reference liver activity equal SUV max 3.9).

There is metabolic activity associated with the paraspinal
musculature superiorly and inferior to the mass lesion extending
from the sacrum all the way to the perispinal neck musculature. This
is favored physiologic/traumatic/inflammatory (benign). Vertebral
body bone destruction noted centrally within the RIGHT paraspinal
mass as described previously.

Incidental CT findings: none
IMPRESSION: 1. Relatively mild metabolic activity ( [HOSPITAL] [DATE]) within the
RIGHT paraspinal mass which is decreased significantly in size from
comparison exam is consistent positive response to first cycle
chemotherapy.
2. No evidence of lymphoma elsewhere on the skull base to thigh FDG
PET scan.
3. Focal activity within the distal esophagus above the GE junction
without mass lesion or obstruction. Differential includes focal
esophagitis versus CT occult neoplasm. Consider upper endoscopy.
4. Diffuse right paraspinal musculature metabolic activity superior
and inferior to the right paraspinal mass on the RIGHT is favored
benign as described above.

## 2018-08-10 MED ORDER — FLUDEOXYGLUCOSE F - 18 (FDG) INJECTION
7.3300 | Freq: Once | INTRAVENOUS | Status: AC | PRN
  Administered 2018-08-10: 7.33 via INTRAVENOUS

## 2018-08-10 MED ORDER — HEPARIN SOD (PORK) LOCK FLUSH 100 UNIT/ML IV SOLN
250.0000 [IU] | Freq: Once | INTRAVENOUS | Status: AC
  Administered 2018-08-10: 250 [IU]
  Filled 2018-08-10: qty 5

## 2018-08-10 MED ORDER — SODIUM CHLORIDE 0.9% FLUSH
10.0000 mL | Freq: Once | INTRAVENOUS | Status: AC
  Administered 2018-08-10: 10 mL
  Filled 2018-08-10: qty 10

## 2018-08-10 MED ORDER — LEVOFLOXACIN 500 MG PO TABS: 500.0000 mg | ORAL_TABLET | Freq: Every day | ORAL | 0 refills | Status: DC

## 2018-08-10 MED ORDER — TBO-FILGRASTIM 300 MCG/0.5ML ~~LOC~~ SOSY
PREFILLED_SYRINGE | SUBCUTANEOUS | Status: AC
  Filled 2018-08-10: qty 0.5

## 2018-08-10 MED ORDER — TBO-FILGRASTIM 300 MCG/0.5ML ~~LOC~~ SOSY
300.0000 ug | PREFILLED_SYRINGE | Freq: Every day | SUBCUTANEOUS | Status: DC
  Administered 2018-08-10: 300 ug via SUBCUTANEOUS

## 2018-08-10 NOTE — Progress Notes (Addendum)
[facility] OFFICE PROGRESS NOTE   Diagnosis: Non-Hodgkin's lymphoma  INTERVAL HISTORY:   [name] returns for follow-up.  She completed cycle 1 CVP/Rituxan 08/02/2018.  She had some nausea.  No vomiting.  Mouth is "sore".  She is unaware of any ulcers.  No diarrhea.  She had some constipation.  She is now taking a laxative and having bowel movements.  She continues to have back pain thinks this is some better.  No fever or chills.  She recalls a recent episode of sweating.  No bleeding.  Objective:  Vital signs in last 24 hours:  Blood pressure 109/65, pulse 92, temperature 98.6 F (37 C), temperature source Oral, resp. rate 18, height 5' 3"  (1.6 m), SpO2 98 %.    HEENT: No thrush or ulcers. Resp: Lungs clear bilaterally. Cardio: Regular rate and rhythm. GI: Abdomen soft and nontender.  No hepatospleno megaly.  External hemorrhoids. Vascular: No leg edema. Neuro: Alert and oriented. Skin: Sacrum with superficial skin ulceration.   Right upper extremity PICC without erythema.  Lab Results:  Lab Results  Component Value Date   WBC 0.3 (LL) 08/10/2018   HGB 7.8 (L) 08/10/2018   HCT 28.9 (L) 08/10/2018   MCV 119.9 (H) 08/10/2018   PLT 162 08/10/2018   NEUTROABS PENDING 08/10/2018    Imaging:  No results found.  Medications: I have reviewed the patient's current medications.  Assessment/Plan: 1. 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 Loss of MLH1 and PMS2, MSI-high, BRAF mutation detected ? Colonoscopy February 2017 ? Colonoscopy 12/16/2017-3 mm polyp in the transverse colon, 10 mm polyp in the sigmoid colon. Diverticulosis in the sigmoid colon. Transverse colon polyp, tubular adenoma; sigmoid colon polyp, tubular adenoma. ? CT 07/12/2018-probable subcutaneous metastasis at the left abdominal wall, left perinephric mass, destructive thoracic/lumbar paraspinous  mass ? CT-guided biopsy of the paraspinous mass 07/13/2018  2. Anemia-likely secondary to bleeding from the colon cancer and surgery. Hemoglobin in normal range 03/20/2016, mild persistent anemia 3. Ascending colon "polyps" removed on the colonoscopy 12/22/2014 with the pathology revealing poorly differentiated adenocarcinoma and a sessile serrated adenoma polyp, similar to the pathology on the hepatic flexure mass 4. History of multiple skin cancers. Seen by dermatology every 3 months. 5. Glaucoma 6.Non-Hodgkin's lymphoma presenting with severe back pain  CTabdomen/pelvis7/30/2019-large destructive right paraspinal chest wall mass lower thoracic/upper lumbar spinewith associated bone destruction involving the right posterior elements of T9, T10 and T11, spinous processes of T8 and T9, and the posterior right 10th and 11th ribs. Probable subcutaneous metastasis anterior left abdominal wall 2.2 cm; enlarging mass in the left perinephric space 3.0 cm in size.  CT L-spine 07/12/2018-paraspinous soft tissue mass on the right at the lower thoracic spine causing destruction of the right T11 and 12 transverse processes; may encroach on the right T11 neural foramen.  CT-guided biopsy paraspinous mass 07/13/2018- poorly differentiated malignant neoplasm; CD45, CD20,bcl-6 andbcl-2positive. 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  Radiationto paraspinous massinitiated 07/14/2018  Cycle 1 CVP/Rituxan 08/02/2018 7.Anorexia/weight loss 8.Mild elevation of calcium-questionhypercalcemia of malignancy 9.  Severe neutropenia and anemia secondary to radiation and chemotherapy  Disposition: Ms. Inman completed cycle 1 CVP/Rituxan 08/02/2018.  She has severe neutropenia and anemia on labs today.  We reviewed neutropenic precautions.  She understands to contact the office with fever, chills, other signs of infection.  She will receive Granix 300  mcg daily for the next  4 days.  We reviewed potential toxicities.  She agrees to proceed.  She will begin Levaquin 500 mg daily.  We are arranging for a blood transfusion 08/11/2018.  We will see her in follow-up and obtain a repeat CBC, chemistry panel and uric acid level when she returns 08/11/2018.  She has superficial skin ulceration at the sacrum.  We are recommending skin care evaluation at the nursing facility.  Patient seen with Dr. Benay Spice.  30 minutes were spent face-to-face at today's visit with the majority of that time involved in counseling/coordination of care.  Ned Card ANP/GNP-BC   08/10/2018  3:34 PM This was a shared visit with Ned Card.  Erica Escobar was interviewed and examined.  She has developed severe neutropenia at day 9 following CVP/rituximab.  Her pain and performance status appears improved.  She knows to contact us for any fever or symptoms of an infection.  She will begin G-CSF and Levaquin prophylaxis.  She will be transfused with packed red blood cells 08/11/2018.  Erica Manson, MD

## 2018-08-10 NOTE — Telephone Encounter (Signed)
Spoke with facility about appts. scheduled for 8/29 - aware of appts. Marland Kitchen

## 2018-08-10 NOTE — Telephone Encounter (Signed)
[place] with pt daughter in law regarding appt and concerns. Informed daughter in law that pt was not present for appts despite her facility being notified yesterday. Also received report on concern regarding nosebleed and blood in stool that pt has experienced. She also states that the pt has fell and is feeling weak. Also expresses concern regarding pt discharge from facility. This RN informed daughter to reach out to nursing facility to further discuss concerns and questions.   1205 - Spoke to pt daughter in law to inform that pt was dropped off at [facility] at 1136. Pt then transported directly to Radiology department for PET scan. Informed daughter in law that our office will attempt to work pt in but pt may need to reschedule. Will keep daughter informed   Spoke with Josie Dixon with [facility]. Called to verify that pt will be getting chemotherapy infusions in our office every 3 weeks and also that pt will need PICC maintenance from their services. Ennis Forts informed that they will primarily utilize her PCP for orders and concerns and reach out to our office for concerns related to chemo or PICC. This RN voiced understanding.

## 2018-08-10 NOTE — Telephone Encounter (Signed)
Called son and relayed information/updates from today's office visit. Informed son of upcoming appts to coordinate transportation due to pending nursing home discharge. Pt son to coordinate with nursing home to see if they "will keep her another night". This RN voiced understanding.

## 2018-08-10 NOTE — Progress Notes (Signed)
Location:  [facility] Number: [phone] Place of Service:  SNF [phone])  Erica Escobar. Sheppard Coil, MD  Patient Care Team: Deland Pretty, MD as PCP - General (Internal Medicine) Michael Boston, MD as Consulting Physician (General Surgery) Carol Ada, MD as Consulting Physician (Gastroenterology) Jacolyn Reedy, MD as Consulting Physician (Cardiology)  Extended Emergency Contact Information Primary Emergency Contact: [place], [place] [address] Johnnette Litter of [place] Phone: [phone] Mobile Phone: [phone] Relation: Son Secondary Emergency Contact: Jone Baseman, [place] [address] Johnnette Litter of [place] Phone: [phone] Mobile Phone: [phone] Relation: Relative  Allergies  Allergen Reactions  . Azithromycin Itching  . Allopurinol   . Cephalexin   . Codeine Itching  . Gabapentin Swelling    Throat and leg swelling  . Niaspan [Niacin]   . Oxybutynin Other (See Comments)    "made me go crazy"-- had crazy dreams.   . Paxil [Paroxetine Hcl]     Unknown reaction  . Penicillins Itching    Has patient had a PCN reaction causing immediate rash, facial/tongue/throat swelling, SOB or lightheadedness with hypotension: No Has patient had a PCN reaction causing severe rash involving mucus membranes or skin necrosis: Yes Has patient had a PCN reaction that required hospitalization: No Has patient had a PCN reaction occurring within the last 10 years: No If all of the above answers are "NO", then may proceed with Cephalosporin use.   Marland Kitchen Pentazocine Itching and Nausea Only    Headache.   . Tizanidine Other (See Comments)    "made me crazy"  . Tramadol     Head feels "swimmy" and weakness  . Vicodin [Hydrocodone-Acetaminophen] Other (See Comments)    "made me go crazy"    Chief Complaint  Patient presents with  . Discharge Note    Discharge from [facility]    HPI:  82 y.o. female with poorly  differentiated colon adenocarcinoma status post treatment in 2016 as well as chronic back pain who was admitted to [facility] long hospital from [place]/30-8/3 for acute on chronic back pain.  She was found to have a large posterior chest wall mass concerning for metastatic disease.  Patient had worsening of her chronic back pain for the prior 2 weeks and she is also lost 30 pounds unintentionally over the prior year.  She had regular follow-up with Dr. Malachy Mood with oncology team with no evidence of recurrence thus far.  In the ER CT showed evidence of a large soft tissue mass in posterior chest wall with related bony destruction and rib fractures and also a perinephric mass in the left anterior wall.  She underwent a CT-guided biopsy of the right para spinal mass on 7/31 which revealed poorly differentiated adenocarcinoma.  She started radiation treatment while still in the hospital.  She was admitted to skilled nursing facility for OT/PT and is now ready to be discharged to home.  In the interim patient has been treated at the cancer center and has developed neutropenia.  She has been placed on Levaquin 500 mg daily for an unspecified period of time, antibiotics have been written for 30 days.    Past Medical History:  Diagnosis Date  . Abnormal tympanic membrane    right ear and mastoid problems  . Arthritis    oseoarthritis  . Cancer of ascending colon ([place]) 02/15/2015  . Constipation, chronic   . COPD (  chronic obstructive pulmonary disease) (HCC)    mild COPD  . Depression   . Diverticulosis   . GERD (gastroesophageal reflux disease)   . Glaucoma    bilateral-sugery with laser, using eye drops  . Hemorrhoids    Internal  . History of hiatal hernia    dx. '87 Schatzki's ring  . Hyperlipidemia    good control  . Hypertension   . Insomnia    hx. of  . Keratosis, actinic   . Leg cramps   . Macular degeneration   . Neuromuscular disorder (HCC)    neuropathy legs,   . On aspirin at home     chronic use  . PONV (postoperative nausea and vomiting)   . Restless leg syndrome   . Schatzki's ring 1987  . Skin cancer    multiple, "tx. area right anterior wrist"  . Urinary incontinence    occ, stress    Past Surgical History:  Procedure Laterality Date  . ABDOMINAL HYSTERECTOMY    . BLADDER REPAIR    . CARDIAC CATHETERIZATION  2007, 2009  . CATARACT EXTRACTION Bilateral   . CHOLECYSTECTOMY    . COLECTOMY    . FINGER NAIL SURGERY    . FOOT SURGERY Right   . HEMORROIDECTOMY    . INNER EAR SURGERY Right    3-4 times  . INTRAOPERATIVE ARTERIOGRAM     right arm  . KNEE SURGERY Right   . LEG SURGERY Bilateral    laser ablation  . MASTOID DEBRIDEMENT     x2  . SKIN CANCER EXCISION     right/left thumb nail, left face, right ear/neck, right leg  . TOE FUSION    . TONSILLECTOMY       reports that she has quit smoking. She has never used smokeless tobacco. She reports that she does not drink alcohol or use drugs. Social History   Socioeconomic History  . Marital status: Widowed    Spouse name: Not on file  . Number of children: Not on file  . Years of education: Not on file  . Highest education level: Not on file  Occupational History  . Not on file  Social Needs  . Financial resource strain: Not on file  . Food insecurity:    Worry: Not on file    Inability: Not on file  . Transportation needs:    Medical: Not on file    Non-medical: Not on file  Tobacco Use  . Smoking status: Former Research scientist (life sciences)  . Smokeless tobacco: Never Used  . Tobacco comment: short time x1 - during pregnancy  Substance and Sexual Activity  . Alcohol use: No    Alcohol/week: 0.0 standard drinks  . Drug use: No  . Sexual activity: Not Currently  Lifestyle  . Physical activity:    Days per week: Not on file    Minutes per session: Not on file  . Stress: Not on file  Relationships  . Social connections:    Talks on phone: Not on file    Gets together: Not on file    Attends religious  service: Not on file    Active member of club or organization: Not on file    Attends meetings of clubs or organizations: Not on file    Relationship status: Not on file  . Intimate partner violence:    Fear of current or ex partner: Not on file    Emotionally abused: Not on file    Physically abused: Not on file  Forced sexual activity: Not on file  Other Topics Concern  . Not on file  Social History Narrative  . Not on file    Pertinent  Health Maintenance Due  Topic Date Due  . DEXA SCAN  07/31/2003  . PNA vac Low Risk Adult (1 of 2 - PCV13) 07/31/2003  . INFLUENZA VACCINE  10/13/2018 (Originally 07/14/2018)    Medications: Allergies as of 08/10/2018      Reactions   Azithromycin Itching   Allopurinol    Cephalexin    Codeine Itching   Gabapentin Swelling   Throat and leg swelling   Niaspan [niacin]    Oxybutynin Other (See Comments)   "made me go crazy"-- had crazy dreams.    Paxil [paroxetine Hcl]    Unknown reaction   Penicillins Itching   Has patient had a PCN reaction causing immediate rash, facial/tongue/throat swelling, SOB or lightheadedness with hypotension: No Has patient had a PCN reaction causing severe rash involving mucus membranes or skin necrosis: Yes Has patient had a PCN reaction that required hospitalization: No Has patient had a PCN reaction occurring within the last 10 years: No If all of the above answers are "NO", then may proceed with Cephalosporin use.   Pentazocine Itching, Nausea Only   Headache.    Tizanidine Other (See Comments)   "made me crazy"   Tramadol    Head feels "swimmy" and weakness   Vicodin [hydrocodone-acetaminophen] Other (See Comments)   "made me go crazy"      Medication List        Accurate as of 08/10/18 11:59 PM. Always use your most recent med list.          aspirin EC 81 MG tablet Take 81 mg by mouth every morning.   beta carotene w/minerals tablet Take 1 tablet by mouth daily.   cyclobenzaprine 5 MG  tablet Commonly known as:  FLEXERIL Take 5 mg by mouth 3 (three) times daily.   ENSURE Take 237 mLs by mouth. VANILLA OR STRAWBERRY IF AVAILABLE PO DAILY   FISH OIL PO Take 2 capsules by mouth daily with breakfast.   furosemide 40 MG tablet Commonly known as:  LASIX Take 20 mg by mouth daily.   gemfibrozil 600 MG tablet Commonly known as:  LOPID Take 600 mg by mouth every morning.   levofloxacin 500 MG tablet Commonly known as:  LEVAQUIN Take 1 tablet (500 mg total) by mouth daily.   loratadine 10 MG tablet Commonly known as:  CLARITIN Take 10 mg by mouth daily as needed for allergies.   meloxicam 15 MG tablet Commonly known as:  MOBIC Take 15 mg by mouth daily.   mirtazapine 15 MG tablet Commonly known as:  REMERON Take 7.5 mg by mouth at bedtime.   pantoprazole 40 MG tablet Commonly known as:  PROTONIX Take 40 mg by mouth daily.   PERCOCET 5-325 MG tablet Generic drug:  oxyCODONE-acetaminophen Take 1 tablet by mouth every 6 (six) hours as needed for severe pain. Give one tablet by mouth every 6 hours scheduled for pain   polyethylene glycol packet Commonly known as:  MIRALAX / GLYCOLAX Take 17 g by mouth daily as needed for moderate constipation.   potassium chloride 10 MEQ tablet Commonly known as:  K-DUR Take 10 mEq by mouth daily.   promethazine 25 MG tablet Commonly known as:  PHENERGAN Take 12.5 mg by mouth every 8 (eight) hours as needed for nausea or vomiting.   REFRESH 1.4-0.6 % Soln  Generic drug:  Polyvinyl Alcohol-Povidone PF Place 6 drops into both eyes daily.   trolamine salicylate 10 % cream Commonly known as:  ASPERCREME Apply 1 application topically as needed for muscle pain (as needed for back pain).   ULORIC 40 MG tablet Generic drug:  febuxostat Take 40 mg by mouth daily.   VITAMIN B 12 PO Take 1,000 mcg by mouth every morning.        Vitals:   08/10/18 0938  BP: 96/62  Pulse: 100  Resp: 20  Temp: 98 F (36.7 C)    Weight: 146 lb (66.2 kg)  Height: _0  (1.6 m)   Body mass index is 25.86 kg/m.  Physical Exam  GENERAL APPEARANCE: Alert, conversant. No acute distress.  HEENT: Unremarkable. RESPIRATORY: Breathing is even, unlabored. Lung sounds are clear   CARDIOVASCULAR: Heart RRR no murmurs, rubs or gallops. No peripheral edema.  GASTROINTESTINAL: Abdomen is soft, non-tender, not distended w/ normal bowel sounds.  NEUROLOGIC: Cranial nerves 2-12 grossly intact. Moves all extremities   Labs reviewed: Basic Metabolic Panel: Recent Labs    07/12/18 1957  07/14/18 0358  08/02/18 0842 08/10/18 1500 08/11/18 1138  NA  --    < > 140   < > 141 138 137  K  --    < > 4.4   < > 4.0 3.8 3.4*  CL  --    < > 100   < > 104 96* 96*  CO2  --    < > 31   < > _1 GLUCOSE  --    < > 112*   < > 90 159* 106*  BUN  --    < > 22   < > 37* 38* 41*  CREATININE  --    < > 1.22*   < > 1.00 1.36* 1.25*  CALCIUM  --    < > 9.9   < > 8.4* 8.9 8.8*  MG 1.5*  --  2.0  --   --   --   --    < > = values in this interval not displayed.   No results found for: [facility] Liver Function Tests: Recent Labs    08/02/18 0842 08/10/18 1500 08/11/18 1138  AST 9* 9* 7*  ALT _2 ALKPHOS 47 79 70  BILITOT 0.5 1.0 0.8  PROT 5.4* 6.0* 5.5*  ALBUMIN 2.7* 2.6* 2.4*   Recent Labs    07/12/18 1530  LIPASE 27   No results for input(s): AMMONIA in the last 8760 hours. CBC: Recent Labs    08/10/18 1500 08/11/18 0817 08/12/18 0833  WBC 0.3* 0.3* 0.9*  NEUTROABS 0.2* 0.1* 0.5*  HGB 7.8* 7.7* 10.2*  HCT 28.9* 22.0* 29.2*  MCV 91.8 93.2 88.5  PLT 162 199 155   Lipid No results for input(s): CHOL, HDL, LDLCALC, TRIG in the last 8760 hours. Cardiac Enzymes: No results for input(s): CKTOTAL, CKMB, CKMBINDEX, TROPONINI in the last 8760 hours. BNP: No results for input(s): BNP in the last 8760 hours. CBG: Recent Labs    06/11/18 1926 08/10/18 1238  GLUCAP 81 115*    Procedures and Imaging  Studies During Stay: Nm Pet Image Initial (pi) Skull Base To Thigh  Result Date: 08/11/2018 CLINICAL DATA:  Initial treatment strategy for diffuse large B-cell lymphoma. EXAM: NUCLEAR MEDICINE PET SKULL BASE TO THIGH TECHNIQUE: 7.3 mCi F-18 FDG was injected intravenously. Full-ring PET imaging was performed from the skull base to thigh after  the radiotracer. CT data was obtained and used for attenuation correction and anatomic localization. Fasting blood glucose: 115 mg/dl COMPARISON:  CT 07/12/2018 FINDINGS: Mediastinal blood pool activity: SUV max 3.1 NECK: No hypermetabolic lymph nodes in the neck. Incidental CT findings: none CHEST: No hypermetabolic mediastinal or hilar nodes. No suspicious pulmonary nodules on the CT scan. Short segment of hypermetabolic activity in the distal esophagus approximately 3 cm above the GE junction. No additional abnormal activity esophagus. No mass lesion or obstruction identified on the CT portion exam Incidental CT findings: RIGHT PICC line noted. ABDOMEN/PELVIS: No abnormal hypermetabolic activity within the liver, pancreas, adrenal glands, or spleen. No hypermetabolic lymph nodes in the abdomen or pelvis. RIGHT hemicolectomy. Metabolic activity associated with hustral contraction in the transverse colon is favored benign. Incidental CT findings: Post hysterectomy SKELETON: The RIGHT paraspinal mass at the T10 - T12 vertebral body level is decreased significantly in size measuring 3.3 cm in thickness compared to 5.4 cm in thickness on comparison exam. The activity of through this region is low with SUV max equal 4.4 (reference liver activity equal SUV max 3.9). There is metabolic activity associated with the paraspinal musculature superiorly and inferior to the mass lesion extending from the sacrum all the way to the perispinal neck musculature. This is favored physiologic/traumatic/inflammatory (benign). Vertebral body bone destruction noted centrally within the RIGHT  paraspinal mass as described previously. Incidental CT findings: none IMPRESSION: 1. Relatively mild metabolic activity ( [place] 3/4) within the RIGHT paraspinal mass which is decreased significantly in size from comparison exam is consistent positive response to first cycle chemotherapy. 2. No evidence of lymphoma elsewhere on the skull base to thigh FDG PET scan. 3. Focal activity within the distal esophagus above the GE junction without mass lesion or obstruction. Differential includes focal esophagitis versus CT occult neoplasm. Consider upper endoscopy. 4. Diffuse right paraspinal musculature metabolic activity superior and inferior to the right paraspinal mass on the RIGHT is favored benign as described above. Electronically Signed   By: Suzy Bouchard M.D.   On: 08/11/2018 09:34   Ir Picc Placement Right >5 Yrs Inc Img Guide  Result Date: 08/01/2018 INDICATION: Patient with prior history of colon cancer, now with non-Hodgkin's lymphoma. Request received for central venous access for chemotherapy. EXAM: ULTRASOUND AND FLUOROSCOPIC GUIDED RIGHT UPPER EXTREMITY PICC LINE INSERTION MEDICATIONS: 1% lidocaine to skin and subcutaneous tissue ANESTHESIA/SEDATION: None FLUOROSCOPY TIME:  Fluoroscopy Time:  42 seconds (5 mGy). COMPLICATIONS: None immediate. TECHNIQUE: The procedure, risks, benefits, and alternatives were explained to the patient and informed written consent was obtained. A timeout was performed prior to the initiation of the procedure. The right upper extremity was prepped with chlorhexidine in a sterile fashion, and a sterile drape was applied covering the operative field. Maximum barrier sterile technique with sterile gowns and gloves were used for the procedure. A timeout was performed prior to the initiation of the procedure. Local anesthesia was provided with 1% lidocaine. Under direct ultrasound guidance, the right brachial vein was accessed with a micropuncture kit after the overlying  soft tissues were anesthetized with 1% lidocaine. An ultrasound image was saved for documentation purposes. A guidewire was advanced to the level of the superior caval-atrial junction for measurement purposes and the PICC line was cut to length. A peel-away sheath was placed and a 38 cm, 5 [place], single lumen was inserted to level of the superior caval-atrial junction. A post procedure spot fluoroscopic was obtained. The catheter easily aspirated and flushed and was sutured  in place. A dressing was placed. The patient tolerated the procedure well without immediate post procedural complication. FINDINGS: After catheter placement, the tip lies within the superior cavoatrial junction. The catheter aspirates and flushes normally and is ready for immediate use. IMPRESSION: Successful ultrasound and fluoroscopic guided placement of a right brachial vein approach, 38 cm, 5 French, single lumen PICC with tip at the superior caval-atrial junction. The PICC line is ready for immediate use. Read by: Rowe Robert, PA-C Electronically Signed   By: Aletta Edouard M.D.   On: 08/01/2018 14:03    Assessment/Plan:   Metastatic cancer [facility])  Pathologic rib fracture, initial encounter  Chest wall mass  Large cell lymphoma (HCC)  CKD (chronic kidney disease), stage III (HCC)  Anorexia  Hyperlipidemia, unspecified hyperlipidemia type  Chronic bilateral low back pain, with sciatica presence unspecified  Gastroesophageal reflux disease without esophagitis  Depression, unspecified depression type  Restless leg syndrome  Cancer of ascending colon s/p robotic colectomy 02/15/2015   Patient is being discharged with the following home health services:  OT/PT/Nursing/Aide/SW  Patient is being discharged with the following durable medical equipment:  none  Patient has been advised to f/u with their PCP in 1-2 weeks to bring them up to date on their rehab stay.  Social services at facility was responsible for  arranging this appointment.  Pt was provided with a 30 day supply of prescriptions for medications and refills must be obtained from their PCP.  For controlled substances, a more limited supply may be provided adequate until PCP appointment only.  Medications have been reconciled.   Time spent > 30 min;> 50% of time with patient was spent reviewing records, labs, tests and studies, counseling and developing plan of care  Erica Escobar. Sheppard Coil, MD

## 2018-08-11 ENCOUNTER — Other Ambulatory Visit: Payer: Self-pay | Admitting: Nurse Practitioner

## 2018-08-11 ENCOUNTER — Encounter: Payer: Self-pay | Admitting: General Practice

## 2018-08-11 ENCOUNTER — Inpatient Hospital Stay (HOSPITAL_BASED_OUTPATIENT_CLINIC_OR_DEPARTMENT_OTHER): Payer: Medicare Other | Admitting: Nurse Practitioner

## 2018-08-11 ENCOUNTER — Encounter: Payer: Self-pay | Admitting: Nurse Practitioner

## 2018-08-11 ENCOUNTER — Encounter: Payer: Self-pay | Admitting: *Deleted

## 2018-08-11 ENCOUNTER — Inpatient Hospital Stay: Payer: Medicare Other

## 2018-08-11 ENCOUNTER — Telehealth: Payer: Self-pay | Admitting: Nurse Practitioner

## 2018-08-11 VITALS — BP 112/61 | HR 92 | Temp 98.4°F | Resp 17 | Ht 63.0 in

## 2018-08-11 VITALS — BP 164/70 | HR 86 | Temp 98.8°F | Resp 18

## 2018-08-11 DIAGNOSIS — L98499 Non-pressure chronic ulcer of skin of other sites with unspecified severity: Secondary | ICD-10-CM | POA: Diagnosis not present

## 2018-08-11 DIAGNOSIS — Z5112 Encounter for antineoplastic immunotherapy: Secondary | ICD-10-CM | POA: Diagnosis not present

## 2018-08-11 DIAGNOSIS — D72829 Elevated white blood cell count, unspecified: Secondary | ICD-10-CM | POA: Diagnosis not present

## 2018-08-11 DIAGNOSIS — B370 Candidal stomatitis: Secondary | ICD-10-CM | POA: Diagnosis not present

## 2018-08-11 DIAGNOSIS — C8330 Diffuse large B-cell lymphoma, unspecified site: Secondary | ICD-10-CM

## 2018-08-11 DIAGNOSIS — D6481 Anemia due to antineoplastic chemotherapy: Secondary | ICD-10-CM | POA: Diagnosis not present

## 2018-08-11 DIAGNOSIS — T380X5A Adverse effect of glucocorticoids and synthetic analogues, initial encounter: Secondary | ICD-10-CM | POA: Diagnosis not present

## 2018-08-11 DIAGNOSIS — D701 Agranulocytosis secondary to cancer chemotherapy: Secondary | ICD-10-CM

## 2018-08-11 DIAGNOSIS — Z5111 Encounter for antineoplastic chemotherapy: Secondary | ICD-10-CM | POA: Diagnosis not present

## 2018-08-11 DIAGNOSIS — C8339 Diffuse large B-cell lymphoma, extranodal and solid organ sites: Secondary | ICD-10-CM

## 2018-08-11 LAB — CMP (CANCER CENTER ONLY)
ALK PHOS: 70 U/L (ref 38–126)
ALT: 9 U/L (ref 0–44)
ANION GAP: 11 (ref 5–15)
AST: 7 U/L — ABNORMAL LOW (ref 15–41)
Albumin: 2.4 g/dL — ABNORMAL LOW (ref 3.5–5.0)
BILIRUBIN TOTAL: 0.8 mg/dL (ref 0.3–1.2)
BUN: 41 mg/dL — ABNORMAL HIGH (ref 8–23)
CALCIUM: 8.8 mg/dL — AB (ref 8.9–10.3)
CO2: 30 mmol/L (ref 22–32)
Chloride: 96 mmol/L — ABNORMAL LOW (ref 98–111)
Creatinine: 1.25 mg/dL — ABNORMAL HIGH (ref 0.44–1.00)
GFR, EST AFRICAN AMERICAN: 46 mL/min — AB (ref >60)
GFR, EST NON AFRICAN AMERICAN: 40 mL/min — AB (ref >60)
GLUCOSE: 106 mg/dL — AB (ref 70–99)
Potassium: 3.4 mmol/L — ABNORMAL LOW (ref 3.5–5.1)
Sodium: 137 mmol/L (ref 135–145)
TOTAL PROTEIN: 5.5 g/dL — AB (ref 6.5–8.1)

## 2018-08-11 LAB — CBC WITH DIFFERENTIAL (CANCER CENTER ONLY)
Basophils Absolute: 0 10*3/uL (ref 0.0–0.1)
Basophils Relative: 0 %
EOS ABS: 0 10*3/uL (ref 0.0–0.5)
Eosinophils Relative: 8 %
HEMATOCRIT: 22 % — AB (ref 34.8–46.6)
Hemoglobin: 7.7 g/dL — ABNORMAL LOW (ref 11.6–15.9)
LYMPHS ABS: 0.1 10*3/uL — AB (ref 0.9–3.3)
Lymphocytes Relative: 32 %
MCH: 32.6 pg (ref 25.1–34.0)
MCHC: 35 g/dL (ref 31.5–36.0)
MCV: 93.2 fL (ref 79.5–101.0)
MONOS PCT: 32 %
Monocytes Absolute: 0.1 10*3/uL (ref 0.1–0.9)
NEUTROS ABS: 0.1 10*3/uL — AB (ref 1.5–6.5)
NEUTROS PCT: 28 %
Platelet Count: 199 10*3/uL (ref 145–400)
RBC: 2.36 MIL/uL — AB (ref 3.70–5.45)
RDW: 13.6 % (ref 11.2–14.5)
WBC: 0.3 10*3/uL — AB (ref 3.9–10.3)

## 2018-08-11 LAB — PREPARE RBC (CROSSMATCH)

## 2018-08-11 LAB — URIC ACID: URIC ACID, SERUM: 7.3 mg/dL — AB (ref 2.5–7.1)

## 2018-08-11 LAB — ABO/RH: ABO/RH(D): A POS

## 2018-08-11 MED ORDER — HEPARIN SOD (PORK) LOCK FLUSH 100 UNIT/ML IV SOLN
500.0000 [IU] | Freq: Every day | INTRAVENOUS | Status: AC | PRN
  Filled 2018-08-11: qty 5

## 2018-08-11 MED ORDER — TBO-FILGRASTIM 300 MCG/0.5ML ~~LOC~~ SOSY
PREFILLED_SYRINGE | SUBCUTANEOUS | Status: AC
  Filled 2018-08-11: qty 0.5

## 2018-08-11 MED ORDER — SODIUM CHLORIDE 0.9% FLUSH
10.0000 mL | INTRAVENOUS | Status: AC | PRN
  Administered 2018-08-11: 10 mL
  Filled 2018-08-11: qty 10

## 2018-08-11 MED ORDER — TBO-FILGRASTIM 300 MCG/0.5ML ~~LOC~~ SOSY
300.0000 ug | PREFILLED_SYRINGE | Freq: Once | SUBCUTANEOUS | Status: AC
  Administered 2018-08-11: 300 ug via SUBCUTANEOUS

## 2018-08-11 MED ORDER — SODIUM CHLORIDE 0.9% IV SOLUTION
250.0000 mL | Freq: Once | INTRAVENOUS | Status: AC
  Administered 2018-08-11: 250 mL via INTRAVENOUS
  Filled 2018-08-11: qty 250

## 2018-08-11 MED ORDER — HEPARIN SOD (PORK) LOCK FLUSH 100 UNIT/ML IV SOLN
250.0000 [IU] | INTRAVENOUS | Status: AC | PRN
  Administered 2018-08-11: 250 [IU]
  Filled 2018-08-11: qty 5

## 2018-08-11 NOTE — Progress Notes (Addendum)
[facility] OFFICE PROGRESS NOTE   Diagnosis: Non-Hodgkin's lymphoma  INTERVAL HISTORY:   Erica Escobar returns as scheduled.  She completed cycle 1 CVP/Rituxan on 08/02/2018.  She was seen in follow-up yesterday and noted to have severe neutropenia and anemia.  She received Granix and was started on Levaquin.  She is scheduled to receive 2 units of blood today.  She has intermittent mild nausea.  Appetite overall is poor.  Back pain continues to be improved.  She took an oxycodone tablet this morning.  No fever or chills.  She reports falling onto the commode seat last night.  Objective:  Vital signs in last 24 hours:  Blood pressure 112/61, pulse 92, temperature 98.4 F (36.9 C), temperature source Oral, resp. rate 17, height 5' 3" (1.6 m), SpO2 95 %.    HEENT: No thrush or ulcers. Resp: Lungs clear bilaterally. Cardio: Regular rate and rhythm. GI: Abdomen soft and nontender. Vascular: No leg edema. Right upper extremity PIC without erythema.  Lab Results:  Lab Results  Component Value Date   WBC 0.3 (LL) 08/11/2018   HGB 7.7 (L) 08/11/2018   HCT 22.0 (L) 08/11/2018   MCV 93.2 08/11/2018   PLT 199 08/11/2018   NEUTROABS 0.1 (LL) 08/11/2018    Imaging:  Nm Pet Image Initial (pi) Skull Base To Thigh  Result Date: 08/11/2018 CLINICAL DATA:  Initial treatment strategy for diffuse large B-cell lymphoma. EXAM: NUCLEAR MEDICINE PET SKULL BASE TO THIGH TECHNIQUE: 7.3 mCi F-18 FDG was injected intravenously. Full-ring PET imaging was performed from the skull base to thigh after the radiotracer. CT data was obtained and used for attenuation correction and anatomic localization. Fasting blood glucose: 115 mg/dl COMPARISON:  CT 07/12/2018 FINDINGS: Mediastinal blood pool activity: SUV max 3.1 NECK: No hypermetabolic lymph nodes in the neck. Incidental CT findings: none CHEST: No hypermetabolic mediastinal or hilar nodes. No suspicious pulmonary nodules on the CT scan.  Short segment of hypermetabolic activity in the distal esophagus approximately 3 cm above the GE junction. No additional abnormal activity esophagus. No mass lesion or obstruction identified on the CT portion exam Incidental CT findings: RIGHT PICC line noted. ABDOMEN/PELVIS: No abnormal hypermetabolic activity within the liver, pancreas, adrenal glands, or spleen. No hypermetabolic lymph nodes in the abdomen or pelvis. RIGHT hemicolectomy. Metabolic activity associated with hustral contraction in the transverse colon is favored benign. Incidental CT findings: Post hysterectomy SKELETON: The RIGHT paraspinal mass at the T10 - T12 vertebral body level is decreased significantly in size measuring 3.3 cm in thickness compared to 5.4 cm in thickness on comparison exam. The activity of through this region is low with SUV max equal 4.4 (reference liver activity equal SUV max 3.9). There is metabolic activity associated with the paraspinal musculature superiorly and inferior to the mass lesion extending from the sacrum all the way to the perispinal neck musculature. This is favored physiologic/traumatic/inflammatory (benign). Vertebral body bone destruction noted centrally within the RIGHT paraspinal mass as described previously. Incidental CT findings: none IMPRESSION: 1. Relatively mild metabolic activity ( [place] 3/4) within the RIGHT paraspinal mass which is decreased significantly in size from comparison exam is consistent positive response to first cycle chemotherapy. 2. No evidence of lymphoma elsewhere on the skull base to thigh FDG PET scan. 3. Focal activity within the distal esophagus above the GE junction without mass lesion or obstruction. Differential includes focal esophagitis versus CT occult neoplasm. Consider upper endoscopy. 4. Diffuse right paraspinal musculature metabolic activity superior and inferior to  the right paraspinal mass on the RIGHT is favored benign as described above. Electronically  Signed   By: Suzy Bouchard M.D.   On: 08/11/2018 09:34    Medications: I have reviewed the patient's current medications.  Assessment/Plan: 1. 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 Loss of MLH1 and PMS2, MSI-high, BRAF mutation detected ? Colonoscopy February 2017 ? Colonoscopy 12/16/2017-3 mm polyp in the transverse colon, 10 mm polyp in the sigmoid colon. Diverticulosis in the sigmoid colon. Transverse colon polyp, tubular adenoma; sigmoid colon polyp, tubular adenoma. ? CT 07/12/2018-probable subcutaneous metastasis at the left abdominal wall, left perinephric mass, destructive thoracic/lumbar paraspinous mass ? CT-guided biopsy of the paraspinous mass 07/13/2018  2. Anemia-likely secondary to bleeding from the colon cancer and surgery. Hemoglobin in normal range 03/20/2016, mild persistent anemia 3. Ascending colon "polyps" removed on the colonoscopy 12/22/2014 with the pathology revealing poorly differentiated adenocarcinoma and a sessile serrated adenoma polyp, similar to the pathology on the hepatic flexure mass 4. History of multiple skin cancers. Seen by dermatology every 3 months. 5. Glaucoma 6.Non-Hodgkin's lymphoma presenting with severe back pain  CTabdomen/pelvis7/30/2019-large destructive right paraspinal chest wall mass lower thoracic/upper lumbar spinewith associated bone destruction involving the right posterior elements of T9, T10 and T11, spinous processes of T8 and T9, and the posterior right 10th and 11th ribs. Probable subcutaneous metastasis anterior left abdominal wall 2.2 cm; enlarging mass in the left perinephric space 3.0 cm in size.  CT L-spine 07/12/2018-paraspinous soft tissue mass on the right at the lower thoracic spine causing destruction of the right T11 and 12 transverse processes; may encroach on the right T11 neural foramen.  CT-guided biopsy paraspinous mass  07/13/2018-poorly differentiated malignant neoplasm; CD45, CD20,bcl-6 andbcl-2positive. 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  Radiationto paraspinous massinitiated 07/14/2018  Cycle 1 CVP/Rituxan 08/02/2018  PET scan 08/10/2018- relatively mild metabolic activity within the right paraspinal mass which is decreased significantly in size from the comparison exam.  No evidence of lymphoma elsewhere.  Focal activity within the distal esophagus above the GE junction without mass lesion or obstruction. 7.Anorexia/weight loss 8.Mild elevation of calcium-questionhypercalcemia of malignancy 9.  Severe neutropenia and anemia secondary to radiation and chemotherapy  Disposition: Erica Escobar appears unchanged.  She is status post cycle 1 CVP/Rituxan 08/02/2018.  She has severe neutropenia and anemia.  Plan to continue Granix daily x4.  She is receiving a blood transfusion today.  We will repeat a CBC on 08/12/2018 and 08/16/2018.  Neutropenic precautions were reviewed with Erica Escobar and her son.  They understand to contact the office immediately with fever, chills, other signs of infection.  She will continue daily Levaquin.  She and her son report the plan is for her to be discharged from the nursing facility 08/13/2018.  They are interested in an assisted living facility.  We will ask the [facility] worker to meet with them while here today.  She will return for Granix 08/12/2018 and 08/13/2018; CBC on 08/12/2018 and 08/16/2018.  We will see her in follow-up prior to cycle 2 CVP/Rituxan on 08/22/2018.  She will contact the office in the interim with any problems.  Patient seen with Dr. Benay Spice.  25 minutes were spent face-to-face at today's visit with the majority of that time involved in counseling/coordination of care.    Ned Card ANP/GNP-BC   08/11/2018  11:45 AM  This was a shared visit with  Ned Card.  Erica Escobar has persistent  severe neutropenia and anemia.  She will continue G-CSF.  She will receive a red cell transfusion today.  We discussed her current status and treatment plan with her son.  They know to contact us for symptoms of an infection.  Julieanne Manson, MD

## 2018-08-11 NOTE — Progress Notes (Signed)
Pt verbalized fall yesterday after returning to her facility. Pt fell onto the commode, hitting her bottom. Pt also noted that her L arm hit the sink. Only medication received this morning, per pt, was her oxycodone for pain. Pt additionally noted incontinence. Wearing pull-up. Pt son concerned about care at the facility, pt to be discharged today without good plan for care.

## 2018-08-11 NOTE — Progress Notes (Signed)
[facility] CSW Progress Notes  Social work consult received from RN working in [employer].  Patient under treatment at [facility], current resident at [facility] for rehabilitation.  Placed in SNF on 07/16/18 after discharge from inpatient.  Son and patient feel she is not well enough to care for herself at home, ask for help w obtaining Assisted Living placement.  CSW provided [facility] list.  Rosary Lively, SW at [facility].  "I have discussed her discharge options w the family since admission.  Gave Medicaid application this morning, let him know prices of assisted living, gave him information on respite care if they want to pay for it, referred to home health."  SNF CSW informed family about option to use respite care at ALF facility while awaiting determination on Medicaid status and seeking longer term Medicaid funded placement.  Current plan is for patient to discharge from SNF and return home.  SNF has referred patient to [facility] for RN, PT, OT, SW, aide.  Will discharge from SNF on Sunday 9/1.  Family is aware of need to go to DSS to investigate options for Medicaid via [place] is appropriate agency for [facility] application process.  If approved for Medicaid, patient would be able to use this for ALF or family care placement.  Family has also been given option of respite care at several facilities locally - respite care can be obtained by paying privately at particular facilities willing to admit patients for 14 - 30 days as transition between SNF and home.  Per SNF CSW, they have had conversations regarding discharge planning from SNF over past several weeks.  Family has been given options that are available for patient and encouraged to make choices re patient discharge plan.    Edwyna Shell, LCSW Clinical Social Worker Phone:  [phone]

## 2018-08-11 NOTE — Telephone Encounter (Signed)
Spoke to patient transportation - they are aware of patients appts tomorrow.

## 2018-08-11 NOTE — Patient Instructions (Signed)

## 2018-08-11 NOTE — Progress Notes (Signed)
Per Lattie Haw, NP followed up with [facility] facility that orders for skin evaluation and sitz bath BID were received from 08/10/18. Spoke with charge nurse Moniqua, RN stated orders were received and would be implemented.

## 2018-08-12 ENCOUNTER — Inpatient Hospital Stay: Payer: Medicare Other

## 2018-08-12 ENCOUNTER — Encounter: Payer: Self-pay | Admitting: Internal Medicine

## 2018-08-12 ENCOUNTER — Other Ambulatory Visit: Payer: Self-pay | Admitting: Nurse Practitioner

## 2018-08-12 DIAGNOSIS — Z5112 Encounter for antineoplastic immunotherapy: Secondary | ICD-10-CM | POA: Diagnosis not present

## 2018-08-12 DIAGNOSIS — D6481 Anemia due to antineoplastic chemotherapy: Secondary | ICD-10-CM | POA: Diagnosis not present

## 2018-08-12 DIAGNOSIS — D72829 Elevated white blood cell count, unspecified: Secondary | ICD-10-CM | POA: Diagnosis not present

## 2018-08-12 DIAGNOSIS — B370 Candidal stomatitis: Secondary | ICD-10-CM | POA: Diagnosis not present

## 2018-08-12 DIAGNOSIS — D701 Agranulocytosis secondary to cancer chemotherapy: Secondary | ICD-10-CM | POA: Diagnosis not present

## 2018-08-12 DIAGNOSIS — Z5111 Encounter for antineoplastic chemotherapy: Secondary | ICD-10-CM | POA: Diagnosis not present

## 2018-08-12 DIAGNOSIS — C8330 Diffuse large B-cell lymphoma, unspecified site: Secondary | ICD-10-CM

## 2018-08-12 DIAGNOSIS — L98499 Non-pressure chronic ulcer of skin of other sites with unspecified severity: Secondary | ICD-10-CM | POA: Diagnosis not present

## 2018-08-12 DIAGNOSIS — C8339 Diffuse large B-cell lymphoma, extranodal and solid organ sites: Secondary | ICD-10-CM | POA: Diagnosis not present

## 2018-08-12 DIAGNOSIS — T380X5A Adverse effect of glucocorticoids and synthetic analogues, initial encounter: Secondary | ICD-10-CM | POA: Diagnosis not present

## 2018-08-12 LAB — BPAM RBC
BLOOD PRODUCT EXPIRATION DATE: 201909242359
Blood Product Expiration Date: 201909242359
ISSUE DATE / TIME: 201908291027
ISSUE DATE / TIME: 201908291027
UNIT TYPE AND RH: 6200
Unit Type and Rh: 6200

## 2018-08-12 LAB — TYPE AND SCREEN
ABO/RH(D): A POS
Antibody Screen: NEGATIVE
UNIT DIVISION: 0
Unit division: 0

## 2018-08-12 LAB — CBC WITH DIFFERENTIAL (CANCER CENTER ONLY)
Basophils Absolute: 0 10*3/uL (ref 0.0–0.1)
Basophils Relative: 1 %
EOS ABS: 0 10*3/uL (ref 0.0–0.5)
EOS PCT: 2 %
HCT: 29.2 % — ABNORMAL LOW (ref 34.8–46.6)
Hemoglobin: 10.2 g/dL — ABNORMAL LOW (ref 11.6–15.9)
LYMPHS ABS: 0.2 10*3/uL — AB (ref 0.9–3.3)
Lymphocytes Relative: 20 %
MCH: 30.9 pg (ref 25.1–34.0)
MCHC: 34.9 g/dL (ref 31.5–36.0)
MCV: 88.5 fL (ref 79.5–101.0)
MONO ABS: 0.2 10*3/uL (ref 0.1–0.9)
Monocytes Relative: 24 %
Neutro Abs: 0.5 10*3/uL — ABNORMAL LOW (ref 1.5–6.5)
Neutrophils Relative %: 53 %
PLATELETS: 155 10*3/uL (ref 145–400)
RBC: 3.3 MIL/uL — ABNORMAL LOW (ref 3.70–5.45)
RDW: 14.9 % — AB (ref 11.2–14.5)
WBC Count: 0.9 10*3/uL — CL (ref 3.9–10.3)

## 2018-08-12 MED ORDER — TBO-FILGRASTIM 300 MCG/0.5ML ~~LOC~~ SOSY
300.0000 ug | PREFILLED_SYRINGE | Freq: Every day | SUBCUTANEOUS | Status: DC
  Administered 2018-08-12: 300 ug via SUBCUTANEOUS

## 2018-08-12 NOTE — Progress Notes (Signed)
Location:  [facility] Room Number: [phone] Place of Service:  SNF [phone])  Erica Delaine. Sheppard Coil, MD  Patient Care Team: Deland Pretty, MD as PCP - General (Internal Medicine) Michael Boston, MD as Consulting Physician (General Surgery) Carol Ada, MD as Consulting Physician (Gastroenterology) Jacolyn Reedy, MD as Consulting Physician (Cardiology)  Extended Emergency Contact Information Primary Emergency Contact: [place], [place] [address] Johnnette Litter of [place] Phone: [phone] Mobile Phone: [phone] Relation: Son Secondary Emergency Contact: Jone Baseman, [place] [address] Johnnette Litter of [place] Phone: [phone] Mobile Phone: [phone] Relation: Relative  Allergies  Allergen Reactions  . Azithromycin Itching  . Allopurinol   . Cephalexin   . Codeine Itching  . Gabapentin Swelling    Throat and leg swelling  . Niaspan [Niacin]   . Oxybutynin Other (See Comments)    "made me go crazy"-- had crazy dreams.   . Paxil [Paroxetine Hcl]     Unknown reaction  . Penicillins Itching    Has patient had a PCN reaction causing immediate rash, facial/tongue/throat swelling, SOB or lightheadedness with hypotension: No Has patient had a PCN reaction causing severe rash involving mucus membranes or skin necrosis: Yes Has patient had a PCN reaction that required hospitalization: No Has patient had a PCN reaction occurring within the last 10 years: No If all of the above answers are "NO", then may proceed with Cephalosporin use.   Marland Kitchen Pentazocine Itching and Nausea Only    Headache.   . Tizanidine Other (See Comments)    "made me crazy"  . Tramadol     Head feels "swimmy" and weakness  . Vicodin [Hydrocodone-Acetaminophen] Other (See Comments)    "made me go crazy"    Chief Complaint  Patient presents with  . Discharge Note    Discharge from [facility]    HPI:  82 y.o. female      Past Medical  History:  Diagnosis Date  . Abnormal tympanic membrane    right ear and mastoid problems  . Arthritis    oseoarthritis  . Cancer of ascending colon ([place]) 02/15/2015  . Constipation, chronic   . COPD (chronic obstructive pulmonary disease) (HCC)    mild COPD  . Depression   . Diverticulosis   . GERD (gastroesophageal reflux disease)   . Glaucoma    bilateral-sugery with laser, using eye drops  . Hemorrhoids    Internal  . History of hiatal hernia    dx. '87 Schatzki's ring  . Hyperlipidemia    good control  . Hypertension   . Insomnia    hx. of  . Keratosis, actinic   . Leg cramps   . Macular degeneration   . Neuromuscular disorder (HCC)    neuropathy legs,   . On aspirin at home    chronic use  . PONV (postoperative nausea and vomiting)   . Restless leg syndrome   . Schatzki's ring 1987  . Skin cancer    multiple, "tx. area right anterior wrist"  . Urinary incontinence    occ, stress    Past Surgical History:  Procedure Laterality Date  . ABDOMINAL HYSTERECTOMY    . BLADDER REPAIR    . CARDIAC CATHETERIZATION  2007, 2009  . CATARACT EXTRACTION Bilateral   . CHOLECYSTECTOMY    . COLECTOMY    . FINGER NAIL SURGERY    . FOOT  SURGERY Right   . HEMORROIDECTOMY    . INNER EAR SURGERY Right    3-4 times  . INTRAOPERATIVE ARTERIOGRAM     right arm  . KNEE SURGERY Right   . LEG SURGERY Bilateral    laser ablation  . MASTOID DEBRIDEMENT     x2  . SKIN CANCER EXCISION     right/left thumb nail, left face, right ear/neck, right leg  . TOE FUSION    . TONSILLECTOMY       reports that she has quit smoking. She has never used smokeless tobacco. She reports that she does not drink alcohol or use drugs. Social History   Socioeconomic History  . Marital status: Widowed    Spouse name: Not on file  . Number of children: Not on file  . Years of education: Not on file  . Highest education level: Not on file  Occupational History  . Not on file  Social Needs  .  Financial resource strain: Not on file  . Food insecurity:    Worry: Not on file    Inability: Not on file  . Transportation needs:    Medical: Not on file    Non-medical: Not on file  Tobacco Use  . Smoking status: Former Research scientist (life sciences)  . Smokeless tobacco: Never Used  . Tobacco comment: short time x1 - during pregnancy  Substance and Sexual Activity  . Alcohol use: No    Alcohol/week: 0.0 standard drinks  . Drug use: No  . Sexual activity: Not Currently  Lifestyle  . Physical activity:    Days per week: Not on file    Minutes per session: Not on file  . Stress: Not on file  Relationships  . Social connections:    Talks on phone: Not on file    Gets together: Not on file    Attends religious service: Not on file    Active member of club or organization: Not on file    Attends meetings of clubs or organizations: Not on file    Relationship status: Not on file  . Intimate partner violence:    Fear of current or ex partner: Not on file    Emotionally abused: Not on file    Physically abused: Not on file    Forced sexual activity: Not on file  Other Topics Concern  . Not on file  Social History Narrative  . Not on file    Pertinent  Health Maintenance Due  Topic Date Due  . DEXA SCAN  07/31/2003  . PNA vac Low Risk Adult (1 of 2 - PCV13) 07/31/2003  . INFLUENZA VACCINE  10/13/2018 (Originally 07/14/2018)    Medications: Allergies as of 08/12/2018      Reactions   Azithromycin Itching   Allopurinol    Cephalexin    Codeine Itching   Gabapentin Swelling   Throat and leg swelling   Niaspan [niacin]    Oxybutynin Other (See Comments)   "made me go crazy"-- had crazy dreams.    Paxil [paroxetine Hcl]    Unknown reaction   Penicillins Itching   Has patient had a PCN reaction causing immediate rash, facial/tongue/throat swelling, SOB or lightheadedness with hypotension: No Has patient had a PCN reaction causing severe rash involving mucus membranes or skin necrosis:  Yes Has patient had a PCN reaction that required hospitalization: No Has patient had a PCN reaction occurring within the last 10 years: No If all of the above answers are "NO", then may proceed  with Cephalosporin use.   Pentazocine Itching, Nausea Only   Headache.    Tizanidine Other (See Comments)   "made me crazy"   Tramadol    Head feels "swimmy" and weakness   Vicodin [hydrocodone-acetaminophen] Other (See Comments)   "made me go crazy"      Medication List        Accurate as of 08/12/18  9:43 AM. Always use your most recent med list.          aspirin EC 81 MG tablet Take 81 mg by mouth every morning.   beta carotene w/minerals tablet Take 1 tablet by mouth daily.   cyclobenzaprine 5 MG tablet Commonly known as:  FLEXERIL Take 5 mg by mouth 3 (three) times daily.   dexamethasone 4 MG tablet Commonly known as:  DECADRON Take 4 mg by mouth daily.   ENSURE Take 237 mLs by mouth. VANILLA OR STRAWBERRY IF AVAILABLE PO DAILY   FISH OIL PO Take 2 capsules by mouth daily with breakfast.   furosemide 40 MG tablet Commonly known as:  LASIX Take 20 mg by mouth daily.   gemfibrozil 600 MG tablet Commonly known as:  LOPID Take 600 mg by mouth every morning.   levofloxacin 500 MG tablet Commonly known as:  LEVAQUIN Take 1 tablet (500 mg total) by mouth daily.   loratadine 10 MG tablet Commonly known as:  CLARITIN Take 10 mg by mouth daily as needed for allergies.   meloxicam 15 MG tablet Commonly known as:  MOBIC Take 15 mg by mouth daily.   mirtazapine 15 MG tablet Commonly known as:  REMERON Take 7.5 mg by mouth at bedtime.   pantoprazole 40 MG tablet Commonly known as:  PROTONIX Take 40 mg by mouth daily.   PERCOCET 5-325 MG tablet Generic drug:  oxyCODONE-acetaminophen Take 1 tablet by mouth every 6 (six) hours as needed for severe pain. Give one tablet by mouth every 6 hours scheduled for pain   polyethylene glycol packet Commonly known as:   MIRALAX / GLYCOLAX Take 17 g by mouth daily as needed for moderate constipation.   potassium chloride 10 MEQ tablet Commonly known as:  K-DUR Take 10 mEq by mouth daily.   promethazine 25 MG tablet Commonly known as:  PHENERGAN Take 12.5 mg by mouth every 8 (eight) hours as needed for nausea or vomiting.   REFRESH 1.4-0.6 % Soln Generic drug:  Polyvinyl Alcohol-Povidone PF Place 6 drops into both eyes daily.   trolamine salicylate 10 % cream Commonly known as:  ASPERCREME Apply 1 application topically as needed for muscle pain (as needed for back pain).   ULORIC 40 MG tablet Generic drug:  febuxostat Take 40 mg by mouth daily.   VITAMIN B 12 PO Take 1,000 mcg by mouth every morning.        Vitals:   08/12/18 0936  BP: (!) 149/85  Pulse: 84  Resp: 18  Temp: 98.7 F (37.1 C)  Weight: 146 lb (66.2 kg)  Height: 5' 3"  (1.6 m)   Body mass index is 25.86 kg/m.  Physical Exam  GENERAL APPEARANCE: Alert, conversant. No acute distress.  HEENT: Unremarkable. RESPIRATORY: Breathing is even, unlabored. Lung sounds are clear   CARDIOVASCULAR: Heart RRR no murmurs, rubs or gallops. No peripheral edema.  GASTROINTESTINAL: Abdomen is soft, non-tender, not distended w/ normal bowel sounds.  NEUROLOGIC: Cranial nerves 2-12 grossly intact. Moves all extremities   Labs reviewed: Basic Metabolic Panel: Recent Labs    07/12/18 1957  07/14/18 0358  08/02/18 0842 08/10/18 1500 08/11/18 1138  NA  --    < > 140   < > 141 138 137  K  --    < > 4.4   < > 4.0 3.8 3.4*  CL  --    < > 100   < > 104 96* 96*  CO2  --    < > 31   < > 28 29 30   GLUCOSE  --    < > 112*   < > 90 159* 106*  BUN  --    < > 22   < > 37* 38* 41*  CREATININE  --    < > 1.22*   < > 1.00 1.36* 1.25*  CALCIUM  --    < > 9.9   < > 8.4* 8.9 8.8*  MG 1.5*  --  2.0  --   --   --   --    < > = values in this interval not displayed.   No results found for: [facility] Liver Function Tests: Recent Labs     08/02/18 0842 08/10/18 1500 08/11/18 1138  AST 9* 9* 7*  ALT 11 10 9   ALKPHOS 47 79 70  BILITOT 0.5 1.0 0.8  PROT 5.4* 6.0* 5.5*  ALBUMIN 2.7* 2.6* 2.4*   Recent Labs    07/12/18 1530  LIPASE 27   No results for input(s): AMMONIA in the last 8760 hours. CBC: Recent Labs    08/10/18 1500 08/11/18 0817 08/12/18 0833  WBC 0.3* 0.3* 0.9*  NEUTROABS 0.2* 0.1* 0.5*  HGB 7.8* 7.7* 10.2*  HCT 28.9* 22.0* 29.2*  MCV 91.8 93.2 88.5  PLT 162 199 155   Lipid No results for input(s): CHOL, HDL, LDLCALC, TRIG in the last 8760 hours. Cardiac Enzymes: No results for input(s): CKTOTAL, CKMB, CKMBINDEX, TROPONINI in the last 8760 hours. BNP: No results for input(s): BNP in the last 8760 hours. 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 EXAM: NUCLEAR MEDICINE PET SKULL BASE TO THIGH TECHNIQUE: 7.3 mCi F-18 FDG was injected intravenously. Full-ring PET imaging was performed from the skull base to thigh after the radiotracer. CT data was obtained and used for attenuation correction and anatomic localization. Fasting blood glucose: 115 mg/dl COMPARISON:  CT 07/12/2018 FINDINGS: Mediastinal blood pool activity: SUV max 3.1 NECK: No hypermetabolic lymph nodes in the neck. Incidental CT findings: none CHEST: No hypermetabolic mediastinal or hilar nodes. No suspicious pulmonary nodules on the CT scan. Short segment of hypermetabolic activity in the distal esophagus approximately 3 cm above the GE junction. No additional abnormal activity esophagus. No mass lesion or obstruction identified on the CT portion exam Incidental CT findings: RIGHT PICC line noted. ABDOMEN/PELVIS: No abnormal hypermetabolic activity within the liver, pancreas, adrenal glands, or spleen. No hypermetabolic  lymph nodes in the abdomen or pelvis. RIGHT hemicolectomy. Metabolic activity associated with hustral contraction in the transverse colon is favored benign. Incidental CT findings: Post hysterectomy SKELETON: The RIGHT paraspinal mass at the T10 - T12 vertebral body level is decreased significantly in size measuring 3.3 cm in thickness compared to 5.4 cm in thickness on comparison exam. The activity of through this region is low with SUV max equal 4.4 (reference liver activity equal SUV  max 3.9). There is metabolic activity associated with the paraspinal musculature superiorly and inferior to the mass lesion extending from the sacrum all the way to the perispinal neck musculature. This is favored physiologic/traumatic/inflammatory (benign). Vertebral body bone destruction noted centrally within the RIGHT paraspinal mass as described previously. Incidental CT findings: none IMPRESSION: 1. Relatively mild metabolic activity ( [place] 3/4) within the RIGHT paraspinal mass which is decreased significantly in size from comparison exam is consistent positive response to first cycle chemotherapy. 2. No evidence of lymphoma elsewhere on the skull base to thigh FDG PET scan. 3. Focal activity within the distal esophagus above the GE junction without mass lesion or obstruction. Differential includes focal esophagitis versus CT occult neoplasm. Consider upper endoscopy. 4. Diffuse right paraspinal musculature metabolic activity superior and inferior to the right paraspinal mass on the RIGHT is favored benign as described above. Electronically Signed   By: Suzy Bouchard M.D.   On: 08/11/2018 09:34   Ct Biopsy  Result Date: 07/13/2018 INDICATION: 82 year old female with a destructive mass centered in the right paraspinal musculature. She presents for CT-guided biopsy of the same. She has a history of colon cancer. EXAM: CT BIOPSY MEDICATIONS: None. ANESTHESIA/SEDATION: Moderate (conscious) sedation was employed during  this procedure. A total of Versed 1 mg and Fentanyl 50 mcg was administered intravenously. Moderate Sedation Time: 10 minutes. The patient's level of consciousness and vital signs were monitored continuously by radiology nursing throughout the procedure under my direct supervision. FLUOROSCOPY TIME:  Fluoroscopy Time: 0 minutes 0 seconds (0 mGy). COMPLICATIONS: None immediate. PROCEDURE: Informed written consent was obtained from the patient after a thorough discussion of the procedural risks, benefits and alternatives. All questions were addressed. Maximal Sterile Barrier Technique was utilized including caps, mask, sterile gowns, sterile gloves, sterile drape, hand hygiene and skin antiseptic. A timeout was performed prior to the initiation of the procedure. A planning axial CT scan was performed. The right paraspinal soft tissue mass was identified. The overlying skin was prepped and draped in standard fashion using chlorhexidine skin prep. Local anesthesia was attained by infiltration with 1% lidocaine. A small dermatotomy was made. Under intermittent CT guidance, a 17 gauge trocar needle was advanced into the periphery of the mass. Multiple 18 gauge core biopsies were then coaxially obtained using the bio Pince automated biopsy device. Biopsy specimens were placed in formalin and delivered to pathology for further analysis. IMPRESSION: Successful CT-guided biopsy of right paraspinal mass. Electronically Signed   By: Jacqulynn Cadet M.D.   On: 07/13/2018 14:38   Ir Picc Placement Right >5 Yrs Inc Img Guide  Result Date: 08/01/2018 INDICATION: Patient with prior history of colon cancer, now with non-Hodgkin's lymphoma. Request received for central venous access for chemotherapy. EXAM: ULTRASOUND AND FLUOROSCOPIC GUIDED RIGHT UPPER EXTREMITY PICC LINE INSERTION MEDICATIONS: 1% lidocaine to skin and subcutaneous tissue ANESTHESIA/SEDATION: None FLUOROSCOPY TIME:  Fluoroscopy Time:  42 seconds (5 mGy).  COMPLICATIONS: None immediate. TECHNIQUE: The procedure, risks, benefits, and alternatives were explained to the patient and informed written consent was obtained. A timeout was performed prior to the initiation of the procedure. The right upper extremity was prepped with chlorhexidine in a sterile fashion, and a sterile drape was applied covering the operative field. Maximum barrier sterile technique with sterile gowns and gloves were used for the procedure. A timeout was performed prior to the initiation of the procedure. Local anesthesia was provided with 1% lidocaine. Under direct ultrasound guidance, the right brachial vein was accessed with a micropuncture  kit after the overlying soft tissues were anesthetized with 1% lidocaine. An ultrasound image was saved for documentation purposes. A guidewire was advanced to the level of the superior caval-atrial junction for measurement purposes and the PICC line was cut to length. A peel-away sheath was placed and a 38 cm, 5 [place], single lumen was inserted to level of the superior caval-atrial junction. A post procedure spot fluoroscopic was obtained. The catheter easily aspirated and flushed and was sutured in place. A dressing was placed. The patient tolerated the procedure well without immediate post procedural complication. FINDINGS: After catheter placement, the tip lies within the superior cavoatrial junction. The catheter aspirates and flushes normally and is ready for immediate use. IMPRESSION: Successful ultrasound and fluoroscopic guided placement of a right brachial vein approach, 38 cm, 5 French, single lumen PICC with tip at the superior caval-atrial junction. The PICC line is ready for immediate use. Read by: Rowe Robert, PA-C Electronically Signed   By: Aletta Edouard M.D.   On: 08/01/2018 14:03    Assessment/Plan:   No diagnosis found.   Patient is being discharged with the following home health services:    Patient is being discharged with  the following durable medical equipment:    Patient has been advised to f/u with their PCP in 1-2 weeks to bring them up to date on their rehab stay.  Social services at facility was responsible for arranging this appointment.  Pt was provided with a 30 day supply of prescriptions for medications and refills must be obtained from their PCP.  For controlled substances, a more limited supply may be provided adequate until PCP appointment only.  Future labs/tests needed:   Erica Delaine. Sheppard Coil, MD This encounter was created in error - please disregard.

## 2018-08-12 NOTE — Progress Notes (Signed)
TC from Lab stating Pt's WBC 0.9 and ANC 0.5 Pt. Receiving granix injection today. Called John RN to go over neutropenic precautions with Pt. Lisa informed OK'd instructions.

## 2018-08-12 NOTE — Patient Instructions (Signed)
Tbo-Filgrastim injection What is this medicine? TBO-FILGRASTIM (T B O fil GRA stim) is a granulocyte colony-stimulating factor that stimulates the growth of neutrophils, a type of white blood cell important in the body's fight against infection. It is used to reduce the incidence of fever and infection in patients with certain types of cancer who are receiving chemotherapy that affects the bone marrow. This medicine may be used for other purposes; ask your health care provider or pharmacist if you have questions. COMMON BRAND NAME(S): Granix What should I tell my health care provider before I take this medicine? They need to know if you have any of these conditions: -bone scan or tests planned -kidney disease -sickle cell anemia -an unusual or allergic reaction to tbo-filgrastim, filgrastim, pegfilgrastim, other medicines, foods, dyes, or preservatives -pregnant or trying to get pregnant -breast-feeding How should I use this medicine? This medicine is for injection under the skin. If you get this medicine at home, you will be taught how to prepare and give this medicine. Refer to the Instructions for Use that come with your medication packaging. Use exactly as directed. Take your medicine at regular intervals. Do not take your medicine more often than directed. It is important that you put your used needles and syringes in a special sharps container. Do not put them in a trash can. If you do not have a sharps container, call your pharmacist or healthcare provider to get one. Talk to your pediatrician regarding the use of this medicine in children. Special care may be needed. Overdosage: If you think you have taken too much of this medicine contact a poison control center or emergency room at once. NOTE: This medicine is only for you. Do not share this medicine with others. What if I miss a dose? It is important not to miss your dose. Call your doctor or health care professional if you miss a  dose. What may interact with this medicine? This medicine may interact with the following medications: -medicines that may cause a release of neutrophils, such as lithium This list may not describe all possible interactions. Give your health care provider a list of all the medicines, herbs, non-prescription drugs, or dietary supplements you use. Also tell them if you smoke, drink alcohol, or use illegal drugs. Some items may interact with your medicine. What should I watch for while using this medicine? You may need blood work done while you are taking this medicine. What side effects may I notice from receiving this medicine? Side effects that you should report to your doctor or health care professional as soon as possible: -allergic reactions like skin rash, itching or hives, swelling of the face, lips, or tongue -blood in the urine -dark urine -dizziness -fast heartbeat -feeling faint -shortness of breath or breathing problems -signs and symptoms of infection like fever or chills; cough; or sore throat -signs and symptoms of kidney injury like trouble passing urine or change in the amount of urine -stomach or side pain, or pain at the shoulder -sweating -swelling of the legs, ankles, or abdomen -tiredness Side effects that usually do not require medical attention (report to your doctor or health care professional if they continue or are bothersome): -bone pain -headache -muscle pain -vomiting This list may not describe all possible side effects. Call your doctor for medical advice about side effects. You may report side effects to FDA at 1-800-FDA-1088. Where should I keep my medicine? Keep out of the reach of children. Store in a refrigerator between   2 and 8 degrees C (36 and 46 degrees F). Keep in carton to protect from light. Throw away this medicine if it is left out of the refrigerator for more than 5 consecutive days. Throw away any unused medicine after the expiration  date. NOTE: This sheet is a summary. It may not cover all possible information. If you have questions about this medicine, talk to your doctor, pharmacist, or health care provider.  2018 Elsevier/Gold Standard (2016-01-20 19:07:04)  

## 2018-08-12 NOTE — Progress Notes (Signed)
Neutropenic precautions reviewed with pt. Copy of labs sent to facility with patient.

## 2018-08-13 ENCOUNTER — Encounter: Payer: Self-pay | Admitting: Internal Medicine

## 2018-08-13 ENCOUNTER — Inpatient Hospital Stay: Payer: Medicare Other

## 2018-08-13 VITALS — BP 121/80 | HR 106 | Temp 98.3°F | Resp 17 | Ht 63.0 in

## 2018-08-13 DIAGNOSIS — T380X5A Adverse effect of glucocorticoids and synthetic analogues, initial encounter: Secondary | ICD-10-CM | POA: Diagnosis not present

## 2018-08-13 DIAGNOSIS — Z5112 Encounter for antineoplastic immunotherapy: Secondary | ICD-10-CM | POA: Diagnosis not present

## 2018-08-13 DIAGNOSIS — D6481 Anemia due to antineoplastic chemotherapy: Secondary | ICD-10-CM | POA: Diagnosis not present

## 2018-08-13 DIAGNOSIS — D701 Agranulocytosis secondary to cancer chemotherapy: Secondary | ICD-10-CM | POA: Diagnosis not present

## 2018-08-13 DIAGNOSIS — D72829 Elevated white blood cell count, unspecified: Secondary | ICD-10-CM | POA: Diagnosis not present

## 2018-08-13 DIAGNOSIS — Z5111 Encounter for antineoplastic chemotherapy: Secondary | ICD-10-CM | POA: Diagnosis not present

## 2018-08-13 DIAGNOSIS — C8580 Other specified types of non-Hodgkin lymphoma, unspecified site: Secondary | ICD-10-CM

## 2018-08-13 DIAGNOSIS — L98499 Non-pressure chronic ulcer of skin of other sites with unspecified severity: Secondary | ICD-10-CM | POA: Diagnosis not present

## 2018-08-13 DIAGNOSIS — C8339 Diffuse large B-cell lymphoma, extranodal and solid organ sites: Secondary | ICD-10-CM | POA: Diagnosis not present

## 2018-08-13 DIAGNOSIS — B370 Candidal stomatitis: Secondary | ICD-10-CM | POA: Diagnosis not present

## 2018-08-13 MED ORDER — HEPARIN SOD (PORK) LOCK FLUSH 100 UNIT/ML IV SOLN
250.0000 [IU] | Freq: Once | INTRAVENOUS | Status: AC
  Administered 2018-08-13: 09:00:00
  Filled 2018-08-13: qty 5

## 2018-08-13 MED ORDER — TBO-FILGRASTIM 300 MCG/0.5ML ~~LOC~~ SOSY
300.0000 ug | PREFILLED_SYRINGE | Freq: Once | SUBCUTANEOUS | Status: AC
  Administered 2018-08-13: 300 ug via SUBCUTANEOUS

## 2018-08-13 MED ORDER — TBO-FILGRASTIM 300 MCG/0.5ML ~~LOC~~ SOSY
PREFILLED_SYRINGE | SUBCUTANEOUS | Status: AC
  Filled 2018-08-13: qty 0.5

## 2018-08-13 MED ORDER — TBO-FILGRASTIM 480 MCG/0.8ML ~~LOC~~ SOSY
PREFILLED_SYRINGE | SUBCUTANEOUS | Status: AC
  Filled 2018-08-13: qty 0.8

## 2018-08-13 MED ORDER — SODIUM CHLORIDE 0.9% FLUSH
10.0000 mL | Freq: Once | INTRAVENOUS | Status: AC
  Administered 2018-08-13: 10 mL
  Filled 2018-08-13: qty 10

## 2018-08-14 ENCOUNTER — Other Ambulatory Visit: Payer: Self-pay

## 2018-08-14 ENCOUNTER — Inpatient Hospital Stay (HOSPITAL_COMMUNITY): Payer: Medicare Other

## 2018-08-14 ENCOUNTER — Emergency Department (HOSPITAL_COMMUNITY): Payer: Medicare Other

## 2018-08-14 ENCOUNTER — Inpatient Hospital Stay (HOSPITAL_COMMUNITY)
Admission: EM | Admit: 2018-08-14 | Discharge: 2018-08-17 | DRG: 683 | Disposition: A | Discharge status: Home Health | Payer: Medicare Other | Attending: Internal Medicine | Admitting: Internal Medicine

## 2018-08-14 ENCOUNTER — Encounter (HOSPITAL_COMMUNITY): Payer: Self-pay | Admitting: Emergency Medicine

## 2018-08-14 DIAGNOSIS — E785 Hyperlipidemia, unspecified: Secondary | ICD-10-CM | POA: Diagnosis present

## 2018-08-14 DIAGNOSIS — J449 Chronic obstructive pulmonary disease, unspecified: Secondary | ICD-10-CM | POA: Diagnosis present

## 2018-08-14 DIAGNOSIS — F329 Major depressive disorder, single episode, unspecified: Secondary | ICD-10-CM | POA: Diagnosis present

## 2018-08-14 DIAGNOSIS — G8929 Other chronic pain: Secondary | ICD-10-CM | POA: Diagnosis present

## 2018-08-14 DIAGNOSIS — Z981 Arthrodesis status: Secondary | ICD-10-CM

## 2018-08-14 DIAGNOSIS — I129 Hypertensive chronic kidney disease with stage 1 through stage 4 chronic kidney disease, or unspecified chronic kidney disease: Secondary | ICD-10-CM | POA: Diagnosis present

## 2018-08-14 DIAGNOSIS — K449 Diaphragmatic hernia without obstruction or gangrene: Secondary | ICD-10-CM | POA: Diagnosis not present

## 2018-08-14 DIAGNOSIS — R42 Dizziness and giddiness: Secondary | ICD-10-CM | POA: Diagnosis not present

## 2018-08-14 DIAGNOSIS — G4700 Insomnia, unspecified: Secondary | ICD-10-CM | POA: Diagnosis not present

## 2018-08-14 DIAGNOSIS — R7989 Other specified abnormal findings of blood chemistry: Secondary | ICD-10-CM | POA: Diagnosis not present

## 2018-08-14 DIAGNOSIS — N179 Acute kidney failure, unspecified: Principal | ICD-10-CM | POA: Diagnosis present

## 2018-08-14 DIAGNOSIS — S3992XA Unspecified injury of lower back, initial encounter: Secondary | ICD-10-CM | POA: Diagnosis not present

## 2018-08-14 DIAGNOSIS — G2581 Restless legs syndrome: Secondary | ICD-10-CM | POA: Diagnosis present

## 2018-08-14 DIAGNOSIS — R269 Unspecified abnormalities of gait and mobility: Secondary | ICD-10-CM | POA: Diagnosis present

## 2018-08-14 DIAGNOSIS — Z9049 Acquired absence of other specified parts of digestive tract: Secondary | ICD-10-CM

## 2018-08-14 DIAGNOSIS — C182 Malignant neoplasm of ascending colon: Secondary | ICD-10-CM | POA: Diagnosis not present

## 2018-08-14 DIAGNOSIS — R112 Nausea with vomiting, unspecified: Secondary | ICD-10-CM

## 2018-08-14 DIAGNOSIS — R627 Adult failure to thrive: Secondary | ICD-10-CM | POA: Diagnosis present

## 2018-08-14 DIAGNOSIS — C8580 Other specified types of non-Hodgkin lymphoma, unspecified site: Secondary | ICD-10-CM | POA: Diagnosis present

## 2018-08-14 DIAGNOSIS — L89152 Pressure ulcer of sacral region, stage 2: Secondary | ICD-10-CM | POA: Diagnosis not present

## 2018-08-14 DIAGNOSIS — I10 Essential (primary) hypertension: Secondary | ICD-10-CM | POA: Diagnosis not present

## 2018-08-14 DIAGNOSIS — D631 Anemia in chronic kidney disease: Secondary | ICD-10-CM | POA: Diagnosis present

## 2018-08-14 DIAGNOSIS — K5730 Diverticulosis of large intestine without perforation or abscess without bleeding: Secondary | ICD-10-CM | POA: Diagnosis not present

## 2018-08-14 DIAGNOSIS — H3530 Unspecified macular degeneration: Secondary | ICD-10-CM | POA: Diagnosis not present

## 2018-08-14 DIAGNOSIS — S199XXA Unspecified injury of neck, initial encounter: Secondary | ICD-10-CM | POA: Diagnosis not present

## 2018-08-14 DIAGNOSIS — Z9221 Personal history of antineoplastic chemotherapy: Secondary | ICD-10-CM

## 2018-08-14 DIAGNOSIS — R296 Repeated falls: Secondary | ICD-10-CM | POA: Diagnosis not present

## 2018-08-14 DIAGNOSIS — Z85828 Personal history of other malignant neoplasm of skin: Secondary | ICD-10-CM

## 2018-08-14 DIAGNOSIS — W19XXXA Unspecified fall, initial encounter: Secondary | ICD-10-CM | POA: Diagnosis present

## 2018-08-14 DIAGNOSIS — Z9842 Cataract extraction status, left eye: Secondary | ICD-10-CM

## 2018-08-14 DIAGNOSIS — N183 Chronic kidney disease, stage 3 unspecified: Secondary | ICD-10-CM | POA: Diagnosis present

## 2018-08-14 DIAGNOSIS — L8990 Pressure ulcer of unspecified site, unspecified stage: Secondary | ICD-10-CM

## 2018-08-14 DIAGNOSIS — I951 Orthostatic hypotension: Secondary | ICD-10-CM

## 2018-08-14 DIAGNOSIS — Z87891 Personal history of nicotine dependence: Secondary | ICD-10-CM

## 2018-08-14 DIAGNOSIS — Z85038 Personal history of other malignant neoplasm of large intestine: Secondary | ICD-10-CM | POA: Diagnosis not present

## 2018-08-14 DIAGNOSIS — Z885 Allergy status to narcotic agent status: Secondary | ICD-10-CM

## 2018-08-14 DIAGNOSIS — Z7982 Long term (current) use of aspirin: Secondary | ICD-10-CM

## 2018-08-14 DIAGNOSIS — K219 Gastro-esophageal reflux disease without esophagitis: Secondary | ICD-10-CM | POA: Diagnosis present

## 2018-08-14 DIAGNOSIS — Z881 Allergy status to other antibiotic agents status: Secondary | ICD-10-CM

## 2018-08-14 DIAGNOSIS — K5900 Constipation, unspecified: Secondary | ICD-10-CM | POA: Diagnosis not present

## 2018-08-14 DIAGNOSIS — K5649 Other impaction of intestine: Secondary | ICD-10-CM | POA: Diagnosis not present

## 2018-08-14 DIAGNOSIS — Z79899 Other long term (current) drug therapy: Secondary | ICD-10-CM

## 2018-08-14 DIAGNOSIS — Z880 Allergy status to penicillin: Secondary | ICD-10-CM

## 2018-08-14 DIAGNOSIS — S0990XA Unspecified injury of head, initial encounter: Secondary | ICD-10-CM | POA: Diagnosis not present

## 2018-08-14 DIAGNOSIS — M545 Low back pain: Secondary | ICD-10-CM | POA: Diagnosis not present

## 2018-08-14 DIAGNOSIS — C8330 Diffuse large B-cell lymphoma, unspecified site: Secondary | ICD-10-CM | POA: Diagnosis present

## 2018-08-14 DIAGNOSIS — Z90710 Acquired absence of both cervix and uterus: Secondary | ICD-10-CM

## 2018-08-14 DIAGNOSIS — J410 Simple chronic bronchitis: Secondary | ICD-10-CM | POA: Diagnosis not present

## 2018-08-14 DIAGNOSIS — E860 Dehydration: Secondary | ICD-10-CM | POA: Diagnosis not present

## 2018-08-14 DIAGNOSIS — Z9841 Cataract extraction status, right eye: Secondary | ICD-10-CM

## 2018-08-14 DIAGNOSIS — S299XXA Unspecified injury of thorax, initial encounter: Secondary | ICD-10-CM | POA: Diagnosis not present

## 2018-08-14 DIAGNOSIS — Z923 Personal history of irradiation: Secondary | ICD-10-CM

## 2018-08-14 DIAGNOSIS — R52 Pain, unspecified: Secondary | ICD-10-CM | POA: Diagnosis not present

## 2018-08-14 DIAGNOSIS — Z888 Allergy status to other drugs, medicaments and biological substances status: Secondary | ICD-10-CM

## 2018-08-14 LAB — COMPREHENSIVE METABOLIC PANEL
ALT: 14 U/L (ref 0–44)
AST: 16 U/L (ref 15–41)
Albumin: 2.4 g/dL — ABNORMAL LOW (ref 3.5–5.0)
Alkaline Phosphatase: 82 U/L (ref 38–126)
Anion gap: 13 (ref 5–15)
BUN: 33 mg/dL — ABNORMAL HIGH (ref 8–23)
CO2: 31 mmol/L (ref 22–32)
Calcium: 8.9 mg/dL (ref 8.9–10.3)
Chloride: 96 mmol/L — ABNORMAL LOW (ref 98–111)
Creatinine, Ser: 1.52 mg/dL — ABNORMAL HIGH (ref 0.44–1.00)
GFR calc Af Amer: 36 mL/min — ABNORMAL LOW (ref >60)
GFR calc non Af Amer: 31 mL/min — ABNORMAL LOW (ref >60)
Glucose, Bld: 85 mg/dL (ref 70–99)
Potassium: 4.1 mmol/L (ref 3.5–5.1)
Sodium: 140 mmol/L (ref 135–145)
Total Bilirubin: 0.8 mg/dL (ref 0.3–1.2)
Total Protein: 5.7 g/dL — ABNORMAL LOW (ref 6.5–8.1)

## 2018-08-14 LAB — CBC WITH DIFFERENTIAL/PLATELET
Basophils Absolute: 0 10*3/uL (ref 0.0–0.1)
Basophils Relative: 0 %
Eosinophils Absolute: 0.1 10*3/uL (ref 0.0–0.7)
Eosinophils Relative: 1 %
HCT: 30.8 % — ABNORMAL LOW (ref 36.0–46.0)
Hemoglobin: 10.7 g/dL — ABNORMAL LOW (ref 12.0–15.0)
Lymphocytes Relative: 6 %
Lymphs Abs: 0.8 10*3/uL (ref 0.7–4.0)
MCH: 30.9 pg (ref 26.0–34.0)
MCHC: 34.7 g/dL (ref 30.0–36.0)
MCV: 89 fL (ref 78.0–100.0)
Monocytes Absolute: 0 10*3/uL — ABNORMAL LOW (ref 0.1–1.0)
Monocytes Relative: 0 %
Neutro Abs: 11.9 10*3/uL — ABNORMAL HIGH (ref 1.7–7.7)
Neutrophils Relative %: 93 %
Platelets: 231 10*3/uL (ref 150–400)
RBC: 3.46 MIL/uL — ABNORMAL LOW (ref 3.87–5.11)
RDW: 14.6 % (ref 11.5–15.5)
WBC: 12.8 10*3/uL — ABNORMAL HIGH (ref 4.0–10.5)

## 2018-08-14 LAB — URINALYSIS, ROUTINE W REFLEX MICROSCOPIC
Bilirubin Urine: NEGATIVE
Glucose, UA: NEGATIVE mg/dL
Hgb urine dipstick: NEGATIVE
Ketones, ur: NEGATIVE mg/dL
Leukocytes, UA: NEGATIVE
Nitrite: NEGATIVE
Protein, ur: NEGATIVE mg/dL
Specific Gravity, Urine: 1.006 (ref 1.005–1.030)
pH: 6 (ref 5.0–8.0)

## 2018-08-14 LAB — LIPASE, BLOOD: Lipase: 23 U/L (ref 11–51)

## 2018-08-14 LAB — POC OCCULT BLOOD, ED: Fecal Occult Bld: POSITIVE — AB

## 2018-08-14 LAB — VITAMIN B12: VITAMIN B 12: 2020 pg/mL — AB (ref 180–914)

## 2018-08-14 LAB — FOLATE: Folate: 12 ng/mL (ref >5.9)

## 2018-08-14 MED ORDER — ENSURE ENLIVE PO LIQD
237.0000 mL | Freq: Two times a day (BID) | ORAL | Status: DC
  Administered 2018-08-15 – 2018-08-17 (×4): 237 mL via ORAL

## 2018-08-14 MED ORDER — GEMFIBROZIL 600 MG PO TABS
600.0000 mg | ORAL_TABLET | Freq: Every morning | ORAL | Status: DC
  Administered 2018-08-15 – 2018-08-17 (×3): 600 mg via ORAL
  Filled 2018-08-14 (×3): qty 1

## 2018-08-14 MED ORDER — ONDANSETRON HCL 4 MG PO TABS
4.0000 mg | ORAL_TABLET | Freq: Four times a day (QID) | ORAL | Status: DC | PRN
  Administered 2018-08-17: 4 mg via ORAL
  Filled 2018-08-14: qty 1

## 2018-08-14 MED ORDER — PANTOPRAZOLE SODIUM 40 MG PO TBEC
40.0000 mg | DELAYED_RELEASE_TABLET | Freq: Every day | ORAL | Status: DC
  Administered 2018-08-14 – 2018-08-17 (×4): 40 mg via ORAL
  Filled 2018-08-14 (×4): qty 1

## 2018-08-14 MED ORDER — ACETAMINOPHEN 325 MG PO TABS
650.0000 mg | ORAL_TABLET | Freq: Four times a day (QID) | ORAL | Status: DC | PRN
  Administered 2018-08-15 – 2018-08-17 (×3): 650 mg via ORAL
  Filled 2018-08-14 (×4): qty 2

## 2018-08-14 MED ORDER — IPRATROPIUM BROMIDE 0.02 % IN SOLN
0.5000 mg | Freq: Two times a day (BID) | RESPIRATORY_TRACT | Status: DC
  Administered 2018-08-15 (×2): 0.5 mg via RESPIRATORY_TRACT
  Filled 2018-08-14 (×2): qty 2.5

## 2018-08-14 MED ORDER — PROSIGHT PO TABS
1.0000 | ORAL_TABLET | Freq: Every day | ORAL | Status: DC
  Administered 2018-08-15 – 2018-08-17 (×3): 1 via ORAL
  Filled 2018-08-14 (×3): qty 1

## 2018-08-14 MED ORDER — ONDANSETRON HCL 4 MG/2ML IJ SOLN
4.0000 mg | Freq: Four times a day (QID) | INTRAMUSCULAR | Status: DC | PRN
  Administered 2018-08-15 – 2018-08-16 (×3): 4 mg via INTRAVENOUS
  Filled 2018-08-14 (×3): qty 2

## 2018-08-14 MED ORDER — POLYVINYL ALCOHOL-POVIDONE PF 1.4-0.6 % OP SOLN: 6.0000 [drp] | Freq: Every day | OPHTHALMIC | Status: DC

## 2018-08-14 MED ORDER — POLYETHYLENE GLYCOL 3350 17 G PO PACK
17.0000 g | PACK | Freq: Every day | ORAL | Status: DC | PRN
  Filled 2018-08-14: qty 1

## 2018-08-14 MED ORDER — SODIUM CHLORIDE 0.9% FLUSH
10.0000 mL | Freq: Two times a day (BID) | INTRAVENOUS | Status: DC
  Administered 2018-08-16 – 2018-08-17 (×2): 10 mL

## 2018-08-14 MED ORDER — SENNOSIDES-DOCUSATE SODIUM 8.6-50 MG PO TABS
1.0000 | ORAL_TABLET | Freq: Every day | ORAL | Status: DC
  Administered 2018-08-14: 1 via ORAL
  Filled 2018-08-14: qty 1

## 2018-08-14 MED ORDER — MIRTAZAPINE 15 MG PO TABS
7.5000 mg | ORAL_TABLET | Freq: Every day | ORAL | Status: DC
  Administered 2018-08-14 – 2018-08-16 (×3): 7.5 mg via ORAL
  Filled 2018-08-14 (×3): qty 1

## 2018-08-14 MED ORDER — VITAMIN B-12 1000 MCG PO TABS
1000.0000 ug | ORAL_TABLET | Freq: Every day | ORAL | Status: DC
  Administered 2018-08-15 – 2018-08-17 (×3): 1000 ug via ORAL
  Filled 2018-08-14 (×3): qty 1

## 2018-08-14 MED ORDER — SODIUM CHLORIDE 0.9% FLUSH
10.0000 mL | INTRAVENOUS | Status: DC | PRN
  Administered 2018-08-17: 10 mL
  Filled 2018-08-14: qty 40

## 2018-08-14 MED ORDER — IPRATROPIUM BROMIDE 0.02 % IN SOLN
0.5000 mg | Freq: Four times a day (QID) | RESPIRATORY_TRACT | Status: DC
  Administered 2018-08-14: 0.5 mg via RESPIRATORY_TRACT
  Filled 2018-08-14: qty 2.5

## 2018-08-14 MED ORDER — FENTANYL CITRATE (PF) 100 MCG/2ML IJ SOLN
50.0000 ug | Freq: Once | INTRAMUSCULAR | Status: AC
  Administered 2018-08-14: 50 ug via INTRAVENOUS
  Filled 2018-08-14: qty 2

## 2018-08-14 MED ORDER — FENTANYL CITRATE (PF) 100 MCG/2ML IJ SOLN: 25.0000 ug | INTRAMUSCULAR | Status: DC | PRN

## 2018-08-14 MED ORDER — SODIUM CHLORIDE 0.9 % IV SOLN
INTRAVENOUS | Status: DC
  Administered 2018-08-14: 16:00:00 via INTRAVENOUS

## 2018-08-14 MED ORDER — HEPARIN SODIUM (PORCINE) 5000 UNIT/ML IJ SOLN
5000.0000 [IU] | Freq: Three times a day (TID) | INTRAMUSCULAR | Status: DC
  Administered 2018-08-14 – 2018-08-17 (×8): 5000 [IU] via SUBCUTANEOUS
  Filled 2018-08-14 (×8): qty 1

## 2018-08-14 MED ORDER — SODIUM CHLORIDE 0.9 % IV BOLUS
500.0000 mL | Freq: Once | INTRAVENOUS | Status: AC
  Administered 2018-08-14: 500 mL via INTRAVENOUS

## 2018-08-14 MED ORDER — SODIUM CHLORIDE 0.9 % IV SOLN
INTRAVENOUS | Status: DC
  Administered 2018-08-16 (×2): via INTRAVENOUS

## 2018-08-14 MED ORDER — FEBUXOSTAT 40 MG PO TABS
40.0000 mg | ORAL_TABLET | Freq: Every day | ORAL | Status: DC
  Administered 2018-08-15 – 2018-08-17 (×3): 40 mg via ORAL
  Filled 2018-08-14 (×3): qty 1

## 2018-08-14 MED ORDER — ACETAMINOPHEN 650 MG RE SUPP: 650.0000 mg | Freq: Four times a day (QID) | RECTAL | Status: DC | PRN

## 2018-08-14 MED ORDER — LORATADINE 10 MG PO TABS: 10.0000 mg | ORAL_TABLET | Freq: Every day | ORAL | Status: DC | PRN

## 2018-08-14 MED ORDER — CYCLOBENZAPRINE HCL 5 MG PO TABS
5.0000 mg | ORAL_TABLET | Freq: Three times a day (TID) | ORAL | Status: DC
  Administered 2018-08-14 – 2018-08-17 (×9): 5 mg via ORAL
  Filled 2018-08-14 (×9): qty 1

## 2018-08-14 MED ORDER — DEXAMETHASONE 4 MG PO TABS
4.0000 mg | ORAL_TABLET | Freq: Every day | ORAL | Status: DC
  Administered 2018-08-14 – 2018-08-17 (×4): 4 mg via ORAL
  Filled 2018-08-14 (×4): qty 1

## 2018-08-14 MED ORDER — POLYVINYL ALCOHOL 1.4 % OP SOLN: 1.0000 [drp] | OPHTHALMIC | Status: DC | PRN

## 2018-08-14 MED ORDER — FLEET ENEMA 7-19 GM/118ML RE ENEM
1.0000 | ENEMA | Freq: Once | RECTAL | Status: AC
  Administered 2018-08-14: 1 via RECTAL
  Filled 2018-08-14: qty 1

## 2018-08-14 NOTE — ED Notes (Signed)
Bed: YT24 Expected date:  Expected time:  Means of arrival:  Comments: 82 yo jaw pain

## 2018-08-14 NOTE — Progress Notes (Signed)
Orthostatic vitals obtained and placed in Epic.  Patient unable to stand for 3 minutes.

## 2018-08-14 NOTE — ED Notes (Signed)
Purewick in place to obtain urine

## 2018-08-14 NOTE — Progress Notes (Signed)
Patient arrived on unit via stretcher from ED. Son and daughter-in-law at bedside.

## 2018-08-14 NOTE — ED Notes (Signed)
Pt was able to urinate by herself, did not need an in and out catheter

## 2018-08-14 NOTE — H&P (Addendum)
History and Physical        Hospital Admission Note Date: 08/14/2018  Patient name: Erica Escobar Medical record number: [id number] Date of birth: Aug 24, 1936 Age: 82 y.o. Gender: female  PCP: Deland Pretty, MD    Patient coming from: snf   I have reviewed all records in the [facility].    Chief Complaint:  Generalized weakness with nausea and vomiting in the last 2 to 3 days  HPI: Patient is a 82 year old female with history of B-cell lymphoma, follows Dr. Learta Codding, colon cancer status post partial colectomy, GERD, COPD, hyperlipidemia, diverticulosis presented from SNF with above complaints.  History was obtained from the patient and family members at the bedside who reported that patient was being discharged from the skilled nursing facility today as she had completed her days of rehab.  However patient felt very weak, dizzy and lightheaded and requested to bring her to the hospital.  Patient reported decreased appetite, 2 episodes of emesis yesterday.  Also reports lower abdominal crampy pain diffusely.  Feels generalized weakness and dizziness with multiple falls in the last few weeks. Patient has history of chronic low back pain otherwise denies any numbness or focal weakness.  Patient was evaluated at the cancer center on 8/29 and was found to be severely neutropenic and anemic.  Patient received 2 units of blood and granix injection.     ED work-up/course:  Temp 97.9, respiratory rate 18, pulse 86, BP 157/72, orthostatic.  BP 78/30 on standing up CMET showed sodium of 140, BUN 33, creatinine 1.5 and was 1.2 on 8/29, 1.0 on 8/20 CBC showed WBCs 12.8 (was 0.9 on 8/30), hemoglobin 10.7, hematocrit 30.8 UA negative for UTI   Review of Systems: Positives marked in 'bold' Constitutional: Denies fever, chills, diaphoresis,+ poor appetite and fatigue.  HEENT: Denies  photophobia, eye pain, redness, hearing loss, ear pain, congestion, sore throat, rhinorrhea, sneezing, mouth sores, trouble swallowing, neck pain, neck stiffness and tinnitus.   Respiratory: Denies SOB, DOE, cough, chest tightness,  and wheezing.   Cardiovascular: Denies chest pain, palpitations and leg swelling.  Gastrointestinal: Please see HPI Genitourinary: Denies dysuria, urgency, frequency, hematuria, flank pain and difficulty urinating.  Musculoskeletal: Denies myalgias, back pain, joint swelling, arthralgias and gait problem. + Falls Skin: Denies pallor, rash and wound.  Neurological: Reports dizziness, generalized weakness, lightheaded Hematological: Denies adenopathy. Easy bruising, personal or family bleeding history  Psychiatric/Behavioral: Denies suicidal ideation, mood changes, confusion, nervousness, sleep disturbance and agitation  Past Medical History: Past Medical History:  Diagnosis Date  . Abnormal tympanic membrane    right ear and mastoid problems  . Arthritis    oseoarthritis  . Cancer of ascending colon ([facility]) 02/15/2015  . Constipation, chronic   . COPD (chronic obstructive pulmonary disease) (HCC)    mild COPD  . Depression   . Diverticulosis   . GERD (gastroesophageal reflux disease)   . Glaucoma    bilateral-sugery with laser, using eye drops  . Hemorrhoids    Internal  . History of hiatal hernia    dx. '87 Schatzki's ring  . Hyperlipidemia    good control  . Hypertension   . Insomnia  hx. of  . Keratosis, actinic   . Leg cramps   . Macular degeneration   . Neuromuscular disorder (HCC)    neuropathy legs,   . On aspirin at home    chronic use  . PONV (postoperative nausea and vomiting)   . Restless leg syndrome   . Schatzki's ring 1987  . Skin cancer    multiple, "tx. area right anterior wrist"  . Urinary incontinence    occ, stress    Past Surgical History:  Procedure Laterality Date  . ABDOMINAL HYSTERECTOMY    . BLADDER REPAIR    .  CARDIAC CATHETERIZATION  2007, 2009  . CATARACT EXTRACTION Bilateral   . CHOLECYSTECTOMY    . COLECTOMY    . FINGER NAIL SURGERY    . FOOT SURGERY Right   . HEMORROIDECTOMY    . INNER EAR SURGERY Right    3-4 times  . INTRAOPERATIVE ARTERIOGRAM     right arm  . KNEE SURGERY Right   . LEG SURGERY Bilateral    laser ablation  . MASTOID DEBRIDEMENT     x2  . SKIN CANCER EXCISION     right/left thumb nail, left face, right ear/neck, right leg  . TOE FUSION    . TONSILLECTOMY      Medications: Prior to Admission medications   Medication Sig Start Date End Date Taking? Authorizing Provider  aspirin EC 81 MG tablet Take 81 mg by mouth every morning.   Yes [provider]  cyclobenzaprine (FLEXERIL) 5 MG tablet Take 5 mg by mouth 3 (three) times daily. 06/29/18  Yes [provider]  dexamethasone (DECADRON) 4 MG tablet Take 4 mg by mouth daily.   Yes [provider]  ENSURE (ENSURE) Take 237 mLs by mouth daily. VANILLA OR STRAWBERRY IF AVAILABLE PO DAILY    Yes [provider]  furosemide (LASIX) 20 MG tablet Take 20 mg by mouth daily.    Yes [provider]  gemfibrozil (LOPID) 600 MG tablet Take 600 mg by mouth every morning.   Yes [provider]  levofloxacin (LEVAQUIN) 500 MG tablet Take 1 tablet (500 mg total) by mouth daily. 08/10/18  Yes Owens Shark, NP  loratadine (CLARITIN) 10 MG tablet Take 10 mg by mouth daily as needed for allergies.   Yes [provider]  meloxicam (MOBIC) 15 MG tablet Take 15 mg by mouth daily.  05/30/18  Yes [provider]  mirtazapine (REMERON) 15 MG tablet Take 7.5 mg by mouth at bedtime. 06/29/18  Yes [provider]  multivitamin-lutein (OCUVITE-LUTEIN) CAPS capsule Take 1 capsule by mouth daily.   Yes [provider]  Omega-3 Fatty Acids (FISH OIL) 1000 MG CAPS Take 2,000 mg by mouth daily with breakfast.    Yes [provider]  oxyCODONE-acetaminophen  (PERCOCET) 5-325 MG tablet Take 1 tablet by mouth See admin instructions. Take 1 tablet by mouth every 6 hours scheduled. Take 1 tablet by mouth every 4 hours as needed for breakthrough pain.   Yes [provider]  pantoprazole (PROTONIX) 40 MG tablet Take 40 mg by mouth daily. 03/09/17  Yes [provider]  polyethylene glycol (MIRALAX / GLYCOLAX) packet Take 17 g by mouth daily as needed for moderate constipation.   Yes [provider]  Polyvinyl Alcohol-Povidone PF (REFRESH) 1.4-0.6 % SOLN Place 6 drops into both eyes daily.   Yes [provider]  potassium chloride (K-DUR) 10 MEQ tablet Take 10 mEq by mouth daily.  06/08/18  Yes [provider]  promethazine (PHENERGAN) 25 MG tablet Take 12.5 mg by mouth every 8 (eight) hours as needed for nausea or vomiting.   Yes [provider]  trolamine salicylate (ASPERCREME) 10 % cream Apply 1 application topically as needed for muscle pain (as needed for back pain).   Yes [provider]  ULORIC 40 MG tablet Take 40 mg by mouth daily.  03/16/18  Yes [provider]  vitamin B-12 (CYANOCOBALAMIN) 1000 MCG tablet Take 1,000 mcg by mouth daily.   Yes [provider]    Allergies:   Allergies  Allergen Reactions  . Azithromycin Itching  . Allopurinol Other (See Comments)    Unknown  . Cephalexin Other (See Comments)    Unknown  . Codeine Itching  . Gabapentin Swelling and Other (See Comments)    Throat and leg swelling  . Niaspan [Niacin] Other (See Comments)    Unknown  . Oxybutynin Other (See Comments)    "made me go crazy"-- had crazy dreams.   . Paxil [Paroxetine Hcl] Other (See Comments)    Unknown reaction  . Penicillins Itching and Other (See Comments)    Has patient had a PCN reaction causing immediate rash, facial/tongue/throat swelling, SOB or lightheadedness with hypotension: No Has patient had a PCN reaction causing severe rash involving mucus membranes or  skin necrosis: Yes Has patient had a PCN reaction that required hospitalization: No Has patient had a PCN reaction occurring within the last 10 years: No If all of the above answers are "NO", then may proceed with Cephalosporin use.   Marland Kitchen Pentazocine Itching, Nausea Only and Other (See Comments)    Headache.   . Tizanidine Other (See Comments)    "made me crazy"  . Tramadol Other (See Comments)    Head feels "swimmy" and weakness  . Vicodin [Hydrocodone-Acetaminophen] Other (See Comments)    "made me go crazy"    Social History:  reports that she has quit smoking. She has never used smokeless tobacco. She reports that she does not drink alcohol or use drugs.  Family History: Reviewed, no pertinent family history  Physical Exam: Blood pressure (!) 142/66, pulse 84, temperature 98 F (36.7 C), temperature source Oral, resp. rate 16, height 5' 3"  (1.6 m), weight 66.7 kg, SpO2 97 %. General: Alert, awake, oriented x3,ill appearing, dry mucous membranes Eyes: pink conjunctiva,anicteric sclera, pupils equal and reactive to light and accomodation, HEENT: normocephalic, atraumatic, oropharynx clear Neck: supple, no masses or lymphadenopathy, no goiter, no bruits, no JVD CVS: Regular rate and rhythm, without murmurs, rubs or gallops. No lower extremity edema Resp : Clear to auscultation bilaterally, no wheezing, rales or rhonchi. GI : Soft, mild diffuse tenderness to deep palpation, nondistended, positive bowel sounds, no masses. No hepatomegaly. No hernia.  Musculoskeletal: No clubbing or cyanosis, positive pedal pulses. No contracture. ROM intact  Neuro: Grossly intact, no focal neurological deficits, strength 5/5 upper and lower extremities bilaterally Psych: alert and oriented x 3, normal mood and affect Skin: no rashes or lesions, warm and dry   LABS on Admission: I have personally reviewed all the labs and imagings below    Basic Metabolic Panel: Recent Labs  Lab 08/11/18 1138  08/14/18 1050  NA 137 140  K 3.4* 4.1  CL 96* 96*  CO2 30 31  GLUCOSE 106* 85  BUN 41* 33*  CREATININE 1.25* 1.52*  CALCIUM 8.8* 8.9   Liver Function Tests: Recent Labs  Lab 08/11/18 1138 08/14/18 1050  AST 7* 16  ALT 9 14  ALKPHOS 70 82  BILITOT 0.8 0.8  PROT 5.5* 5.7*  ALBUMIN 2.4* 2.4*   Recent Labs  Lab 08/14/18 1050  LIPASE 23   No results for input(s): AMMONIA in the last 168 hours. CBC: Recent Labs  Lab 08/12/18 0833 08/14/18 1050  WBC 0.9* 12.8*  NEUTROABS 0.5* 11.9*  HGB 10.2* 10.7*  HCT 29.2* 30.8*  MCV 88.5 89.0  PLT 155 231   Cardiac Enzymes: No results for input(s): CKTOTAL, CKMB, CKMBINDEX, TROPONINI in the last 168 hours. BNP: Invalid input(s): POCBNP CBG: Recent Labs  Lab 08/10/18 1238  GLUCAP 115*    Radiological Exams on Admission:  Dg Chest 2 View  Result Date: 08/14/2018 CLINICAL DATA:  She has lymphoma and is being currently being treated for it. Generalized pain started last fall but 2 days it got worse to the point that she is unable to eat but no dental pain. States she has fallen multiple times. EXAM: CHEST - 2 VIEW COMPARISON:  Chest x-ray on 07/12/2018 FINDINGS: The patient has a RIGHT-sided PICC line, tip overlying the level of the superior vena cava. Shallow lung inflation accentuates heart size. The lungs are clear. No pulmonary edema. Degenerative changes are seen in thoracic spine. IMPRESSION: No active cardiopulmonary disease. Electronically Signed   By: Nolon Nations M.D.   On: 08/14/2018 12:28   Dg Thoracic Spine 4v  Result Date: 08/14/2018 CLINICAL DATA:  She has lymphoma and is being currently being treated for it. Generalized pain started last fall but 2 days it got worse to the point that she is unable to eat but no dental pain. States she has fallen multiple times. EXAM: THORACIC SPINE - 4+ VIEW COMPARISON:  None. FINDINGS: There are mild degenerative changes throughout the thoracic spine. No acute fracture or  traumatic subluxation. No suspicious lytic or blastic lesions are identified. RIGHT-sided PICC line tip overlies the superior vena cava. Surgical clips are in the RIGHT UPPER QUADRANT. IMPRESSION: No evidence for acute  abnormality. Electronically Signed   By: Nolon Nations M.D.   On: 08/14/2018 12:27   Dg Lumbar Spine Complete  Result Date: 08/14/2018 CLINICAL DATA:  Low back pain. Status post fall 2 days ago. Initial encounter. EXAM: LUMBAR SPINE - COMPLETE 4+ VIEW COMPARISON:  Plain films lumbar spine 05/25/2018. FINDINGS: No fracture is identified. Convex right scoliosis and severe multilevel degenerative disease are stable in appearance. Large stool ball is noted in the rectum. IMPRESSION: No acute abnormality. Convex right scoliosis and advanced multilevel degenerative disease. Large stool ball in the rectum compatible with fecal impaction. Electronically Signed   By: Inge Rise M.D.   On: 08/14/2018 12:28   Ct Head Wo Contrast  Result Date: 08/14/2018 CLINICAL DATA:  Multiple falls last week. EXAM: CT HEAD WITHOUT CONTRAST CT CERVICAL SPINE WITHOUT CONTRAST TECHNIQUE: Multidetector CT imaging of the head and cervical spine was performed following the standard protocol without intravenous contrast. Multiplanar CT image reconstructions of the cervical spine were also generated. COMPARISON:  CT head dated August 08, 2015. CT cervical spine dated March 10, 2009. FINDINGS: CT HEAD FINDINGS Brain: No evidence of acute infarction, hemorrhage, hydrocephalus, extra-axial collection or mass lesion/mass effect. Stable mild atrophy and chronic microvascular ischemic changes. Vascular: Atherosclerotic vascular calcification of the carotid siphons. No hyperdense vessel. Skull: Negative for fracture. Unchanged right parietal bone osteoma. Sinuses/Orbits: No acute finding. Prior right mastoidectomy. Chronic opacification of the right sphenoid sinus. Other: None. CT CERVICAL SPINE FINDINGS  Alignment: 3 mm  anterolisthesis at C7-T1 due to facet arthropathy. No traumatic malalignment. Skull base and vertebrae: No acute fracture. No primary bone lesion or focal pathologic process. Soft tissues and spinal canal: No prevertebral fluid or swelling. No visible canal hematoma. Disc levels: Moderate multilevel disc height loss and facet uncovertebral hypertrophy throughout the cervical spine Upper chest: Negative. Other: None. IMPRESSION: 1. No acute intracranial abnormality. Stable mild atrophy and chronic microvascular ischemic changes. 2. No acute cervical spine fracture. Moderate cervical spondylosis, progressed since 2010. Electronically Signed   By: Titus Dubin M.D.   On: 08/14/2018 12:38   Ct Cervical Spine Wo Contrast  Result Date: 08/14/2018 CLINICAL DATA:  Multiple falls last week. EXAM: CT HEAD WITHOUT CONTRAST CT CERVICAL SPINE WITHOUT CONTRAST TECHNIQUE: Multidetector CT imaging of the head and cervical spine was performed following the standard protocol without intravenous contrast. Multiplanar CT image reconstructions of the cervical spine were also generated. COMPARISON:  CT head dated August 08, 2015. CT cervical spine dated March 10, 2009. FINDINGS: CT HEAD FINDINGS Brain: No evidence of acute infarction, hemorrhage, hydrocephalus, extra-axial collection or mass lesion/mass effect. Stable mild atrophy and chronic microvascular ischemic changes. Vascular: Atherosclerotic vascular calcification of the carotid siphons. No hyperdense vessel. Skull: Negative for fracture. Unchanged right parietal bone osteoma. Sinuses/Orbits: No acute finding. Prior right mastoidectomy. Chronic opacification of the right sphenoid sinus. Other: None. CT CERVICAL SPINE FINDINGS Alignment: 3 mm anterolisthesis at C7-T1 due to facet arthropathy. No traumatic malalignment. Skull base and vertebrae: No acute fracture. No primary bone lesion or focal pathologic process. Soft tissues and spinal canal: No prevertebral fluid or  swelling. No visible canal hematoma. Disc levels: Moderate multilevel disc height loss and facet uncovertebral hypertrophy throughout the cervical spine Upper chest: Negative. Other: None. IMPRESSION: 1. No acute intracranial abnormality. Stable mild atrophy and chronic microvascular ischemic changes. 2. No acute cervical spine fracture. Moderate cervical spondylosis, progressed since 2010. Electronically Signed   By: Titus Dubin M.D.   On: 08/14/2018 12:38      EKG: Independently reviewed.  Rate 86, normal sinus rhythm   Assessment/Plan Principal Problem:   Acute kidney injury ([place]) post on CKD stage III -Likely due to dehydration, nausea and vomiting, medications  including furosemide, meloxicam -Hold Lasix, placed on gentle IV fluid hydration -Baseline creatinine 1.0-1.2, presenting with creatinine of 1.5   Active Problems: Nausea and vomiting with abd pain -placed  on Zofran, IV fluid hydration, obtain CT abdomen for further work-up -Continue Protonix, soft diet  Falls with failure to thrive -Prior to STR, patient was living alone, she was being discharged from the rehab today. -Will obtain PT OT evaluation, may need higher level of care  Dehydration with orthostatic hypotension -Continue IV fluid hydration, hold furosemide -Continue B12, folate, orthostatic vital signs in a.m.    Cancer of ascending colon s/p robotic colectomy 02/15/2015 -Followed by Dr. Learta Codding, pathology showed poorly differentiated adenocarcinoma -Received 2 units packed RBC transfusion and granix on 8/29  Non-Hodgkin lymphoma with chronic back pain -Continue pain control, PT OT evaluation -Continue Decadron    COPD (chronic obstructive pulmonary disease) (HCC) -Currently stable, no wheezing    Hyperlipidemia -Continue gemfibrozil   DVT prophylaxis: Heparin subcu  CODE STATUS: Full CODE STATUS  Consults called: None  Family Communication: Admission, patients condition and plan of care  including tests being ordered have been discussed with the patient, son and daughter-in-law who indicates understanding and agree with the plan and Code Status  Admission status:  Disposition plan: Further plan will depend as patient's clinical course evolves and further radiologic and laboratory data become available.    At the time of admission, it appears that the appropriate admission status for this patient is INPATIENT . This is judged to be reasonable and necessary in order to provide the required intensity of service to ensure the patient's safety given the presenting symptoms orthostatic, hypertension, acute kidney injury, physical exam findings, and initial radiographic and laboratory data in the context of their chronic comorbidities.  The medical decision making on this patient was of high complexity and the patient is at high risk for clinical deterioration, therefore this is a level 3 visit.   Time Spent on Admission: 70 minutes    Ripudeep Rai M.D. [facility] 08/14/2018, 4:38 PM Pager: [phone]  If 7PM-7AM, please contact night-coverage [email or web address] Password [email or web address]

## 2018-08-14 NOTE — Progress Notes (Signed)
IV team notified regarding PICC.

## 2018-08-14 NOTE — ED Notes (Signed)
ED TO INPATIENT HANDOFF REPORT  Name/Age/Gender Erica Escobar 82 y.o. female  Code Status Code Status History    Date Active Date Inactive Code Status Order ID Comments User Context   07/12/2018 2005 07/16/2018 1546 Full Code [id number]  Tomma Rakers, MD ED   02/15/2015 1239 02/19/2015 1334 Full Code [id number]  Michael Boston, MD Inpatient      Home/SNF/Other Rehab  Chief Complaint generalized pain  Level of Care/Admitting Diagnosis ED Disposition    ED Disposition Condition [place] [facility] Area: [facility] [[id number]]  Level of Care: Med-Surg [16]  Diagnosis: Acute kidney injury [facility]) [[id number]]  Admitting Physician: RAI, [place]  Attending Physician: RAI, RIPUDEEP K [[id number]]  Estimated length of stay: past midnight tomorrow  Certification:: I certify this patient will need inpatient services for at least 2 midnights  PT Class (Do Not Modify): Inpatient [101]  PT Acc Code (Do Not Modify): Private [1]       Medical History Past Medical History:  Diagnosis Date  . Abnormal tympanic membrane    right ear and mastoid problems  . Arthritis    oseoarthritis  . Cancer of ascending colon ([place]) 02/15/2015  . Constipation, chronic   . COPD (chronic obstructive pulmonary disease) (HCC)    mild COPD  . Depression   . Diverticulosis   . GERD (gastroesophageal reflux disease)   . Glaucoma    bilateral-sugery with laser, using eye drops  . Hemorrhoids    Internal  . History of hiatal hernia    dx. '87 Schatzki's ring  . Hyperlipidemia    good control  . Hypertension   . Insomnia    hx. of  . Keratosis, actinic   . Leg cramps   . Macular degeneration   . Neuromuscular disorder (HCC)    neuropathy legs,   . On aspirin at home    chronic use  . PONV (postoperative nausea and vomiting)   . Restless leg syndrome   . Schatzki's ring 1987  . Skin cancer    multiple, "tx. area right anterior wrist"  . Urinary incontinence     occ, stress    Allergies Allergies  Allergen Reactions  . Azithromycin Itching  . Allopurinol Other (See Comments)    Unknown  . Cephalexin Other (See Comments)    Unknown  . Codeine Itching  . Gabapentin Swelling and Other (See Comments)    Throat and leg swelling  . Niaspan [Niacin] Other (See Comments)    Unknown  . Oxybutynin Other (See Comments)    "made me go crazy"-- had crazy dreams.   . Paxil [Paroxetine Hcl] Other (See Comments)    Unknown reaction  . Penicillins Itching and Other (See Comments)    Has patient had a PCN reaction causing immediate rash, facial/tongue/throat swelling, SOB or lightheadedness with hypotension: No Has patient had a PCN reaction causing severe rash involving mucus membranes or skin necrosis: Yes Has patient had a PCN reaction that required hospitalization: No Has patient had a PCN reaction occurring within the last 10 years: No If all of the above answers are "NO", then may proceed with Cephalosporin use.   Marland Kitchen Pentazocine Itching, Nausea Only and Other (See Comments)    Headache.   . Tizanidine Other (See Comments)    "made me crazy"  . Tramadol Other (See Comments)    Head feels "swimmy" and weakness  . Vicodin [Hydrocodone-Acetaminophen] Other (See Comments)    "made me go  crazy"    IV Location/Drains/Wounds Patient Lines/Drains/Airways Status   Active Line/Drains/Airways    Name:   Placement date:   Placement time:   Site:   Days:   PICC Single Lumen 08/01/18 PICC Right Brachial 38 cm   08/01/18    1343    Brachial   13   External Urinary Catheter   07/13/18    2207    -   32          Labs/Imaging Results for orders placed or performed during the hospital encounter of 08/14/18 (from the past 48 hour(s))  POC occult blood, ED Provider will collect     Status: Abnormal   Collection Time: 08/14/18 10:44 AM  Result Value Ref Range   Fecal Occult Bld POSITIVE (A) NEGATIVE  CBC with Differential     Status: Abnormal   Collection  Time: 08/14/18 10:50 AM  Result Value Ref Range   WBC 12.8 (H) 4.0 - 10.5 K/uL   RBC 3.46 (L) 3.87 - 5.11 MIL/uL   Hemoglobin 10.7 (L) 12.0 - 15.0 g/dL   HCT 30.8 (L) 36.0 - 46.0 %   MCV 89.0 78.0 - 100.0 fL   MCH 30.9 26.0 - 34.0 pg   MCHC 34.7 30.0 - 36.0 g/dL   RDW 14.6 11.5 - 15.5 %   Platelets 231 150 - 400 K/uL   Neutrophils Relative % 93 %   Lymphocytes Relative 6 %   Monocytes Relative 0 %   Eosinophils Relative 1 %   Basophils Relative 0 %   Neutro Abs 11.9 (H) 1.7 - 7.7 K/uL   Lymphs Abs 0.8 0.7 - 4.0 K/uL   Monocytes Absolute 0.0 (L) 0.1 - 1.0 K/uL   Eosinophils Absolute 0.1 0.0 - 0.7 K/uL   Basophils Absolute 0.0 0.0 - 0.1 K/uL   RBC Morphology POLYCHROMASIA PRESENT     Comment: TARGET CELLS   WBC Morphology MILD LEFT SHIFT (1-5% METAS, OCC MYELO, OCC BANDS)     Comment: Performed at [facility], [place] [address], [place], [place] [address]  Comprehensive metabolic panel     Status: Abnormal   Collection Time: 08/14/18 10:50 AM  Result Value Ref Range   Sodium 140 135 - 145 mmol/L   Potassium 4.1 3.5 - 5.1 mmol/L   Chloride 96 (L) 98 - 111 mmol/L   CO2 31 22 - 32 mmol/L   Glucose, Bld 85 70 - 99 mg/dL   BUN 33 (H) 8 - 23 mg/dL   Creatinine, Ser 1.52 (H) 0.44 - 1.00 mg/dL   Calcium 8.9 8.9 - 10.3 mg/dL   Total Protein 5.7 (L) 6.5 - 8.1 g/dL   Albumin 2.4 (L) 3.5 - 5.0 g/dL   AST 16 15 - 41 U/L   ALT 14 0 - 44 U/L   Alkaline Phosphatase 82 38 - 126 U/L   Total Bilirubin 0.8 0.3 - 1.2 mg/dL   GFR calc non Af Amer 31 (L) >60 mL/min   GFR calc Af Amer 36 (L) >60 mL/min    Comment: (NOTE) The eGFR has been calculated using the CKD EPI equation. This calculation has not been validated in all clinical situations. eGFR's persistently <60 mL/min signify possible Chronic Kidney Disease.    Anion gap 13 5 - 15    Comment: Performed at [facility], [place] [address], [place], [place] [address]  Lipase, blood     Status: None    Collection Time: 08/14/18 10:50 AM  Result Value Ref Range   Lipase 23 11 - 51 U/L    Comment: Performed at [facility], [place] [address], [place], [place] [address]  Urinalysis, Routine w reflex microscopic     Status: None   Collection Time: 08/14/18  2:35 PM  Result Value Ref Range   Color, Urine YELLOW YELLOW   APPearance CLEAR CLEAR   Specific Gravity, Urine 1.006 1.005 - 1.030   pH 6.0 5.0 - 8.0   Glucose, UA NEGATIVE NEGATIVE mg/dL   Hgb urine dipstick NEGATIVE NEGATIVE   Bilirubin Urine NEGATIVE NEGATIVE   Ketones, ur NEGATIVE NEGATIVE mg/dL   Protein, ur NEGATIVE NEGATIVE mg/dL   Nitrite NEGATIVE NEGATIVE   Leukocytes, UA NEGATIVE NEGATIVE    Comment: Performed at [facility] [address], [place], [place] [address]   Dg Chest 2 View  Result Date: 08/14/2018 CLINICAL DATA:  She has lymphoma and is being currently being treated for it. Generalized pain started last fall but 2 days it got worse to the point that she is unable to eat but no dental pain. States she has fallen multiple times. EXAM: CHEST - 2 VIEW COMPARISON:  Chest x-ray on 07/12/2018 FINDINGS: The patient has a RIGHT-sided PICC line, tip overlying the level of the superior vena cava. Shallow lung inflation accentuates heart size. The lungs are clear. No pulmonary edema. Degenerative changes are seen in thoracic spine. IMPRESSION: No active cardiopulmonary disease. Electronically Signed   By: Nolon Nations M.D.   On: 08/14/2018 12:28   Dg Thoracic Spine 4v  Result Date: 08/14/2018 CLINICAL DATA:  She has lymphoma and is being currently being treated for it. Generalized pain started last fall but 2 days it got worse to the point that she is unable to eat but no dental pain. States she has fallen multiple times. EXAM: THORACIC SPINE - 4+ VIEW COMPARISON:  None. FINDINGS: There are mild degenerative changes throughout the thoracic spine. No acute fracture or traumatic subluxation.  No suspicious lytic or blastic lesions are identified. RIGHT-sided PICC line tip overlies the superior vena cava. Surgical clips are in the RIGHT UPPER QUADRANT. IMPRESSION: No evidence for acute  abnormality. Electronically Signed   By: Nolon Nations M.D.   On: 08/14/2018 12:27   Dg Lumbar Spine Complete  Result Date: 08/14/2018 CLINICAL DATA:  Low back pain. Status post fall 2 days ago. Initial encounter. EXAM: LUMBAR SPINE - COMPLETE 4+ VIEW COMPARISON:  Plain films lumbar spine 05/25/2018. FINDINGS: No fracture is identified. Convex right scoliosis and severe multilevel degenerative disease are stable in appearance. Large stool ball is noted in the rectum. IMPRESSION: No acute abnormality. Convex right scoliosis and advanced multilevel degenerative disease. Large stool ball in the rectum compatible with fecal impaction. Electronically Signed   By: Inge Rise M.D.   On: 08/14/2018 12:28   Ct Head Wo Contrast  Result Date: 08/14/2018 CLINICAL DATA:  Multiple falls last week. EXAM: CT HEAD WITHOUT CONTRAST CT CERVICAL SPINE WITHOUT CONTRAST TECHNIQUE: Multidetector CT imaging of the head and cervical spine was performed following the standard protocol without intravenous contrast. Multiplanar CT image reconstructions of the cervical spine were also generated. COMPARISON:  CT head dated August 08, 2015. CT cervical spine dated March 10, 2009. FINDINGS: CT HEAD FINDINGS Brain: No evidence of acute infarction, hemorrhage, hydrocephalus, extra-axial collection or mass lesion/mass effect. Stable mild atrophy and chronic microvascular ischemic changes. Vascular: Atherosclerotic vascular calcification of the carotid siphons. No hyperdense vessel. Skull: Negative  for fracture. Unchanged right parietal bone osteoma. Sinuses/Orbits: No acute finding. Prior right mastoidectomy. Chronic opacification of the right sphenoid sinus. Other: None. CT CERVICAL SPINE FINDINGS Alignment: 3 mm anterolisthesis at C7-T1 due  to facet arthropathy. No traumatic malalignment. Skull base and vertebrae: No acute fracture. No primary bone lesion or focal pathologic process. Soft tissues and spinal canal: No prevertebral fluid or swelling. No visible canal hematoma. Disc levels: Moderate multilevel disc height loss and facet uncovertebral hypertrophy throughout the cervical spine Upper chest: Negative. Other: None. IMPRESSION: 1. No acute intracranial abnormality. Stable mild atrophy and chronic microvascular ischemic changes. 2. No acute cervical spine fracture. Moderate cervical spondylosis, progressed since 2010. Electronically Signed   By: Titus Dubin M.D.   On: 08/14/2018 12:38   Ct Cervical Spine Wo Contrast  Result Date: 08/14/2018 CLINICAL DATA:  Multiple falls last week. EXAM: CT HEAD WITHOUT CONTRAST CT CERVICAL SPINE WITHOUT CONTRAST TECHNIQUE: Multidetector CT imaging of the head and cervical spine was performed following the standard protocol without intravenous contrast. Multiplanar CT image reconstructions of the cervical spine were also generated. COMPARISON:  CT head dated August 08, 2015. CT cervical spine dated March 10, 2009. FINDINGS: CT HEAD FINDINGS Brain: No evidence of acute infarction, hemorrhage, hydrocephalus, extra-axial collection or mass lesion/mass effect. Stable mild atrophy and chronic microvascular ischemic changes. Vascular: Atherosclerotic vascular calcification of the carotid siphons. No hyperdense vessel. Skull: Negative for fracture. Unchanged right parietal bone osteoma. Sinuses/Orbits: No acute finding. Prior right mastoidectomy. Chronic opacification of the right sphenoid sinus. Other: None. CT CERVICAL SPINE FINDINGS Alignment: 3 mm anterolisthesis at C7-T1 due to facet arthropathy. No traumatic malalignment. Skull base and vertebrae: No acute fracture. No primary bone lesion or focal pathologic process. Soft tissues and spinal canal: No prevertebral fluid or swelling. No visible canal  hematoma. Disc levels: Moderate multilevel disc height loss and facet uncovertebral hypertrophy throughout the cervical spine Upper chest: Negative. Other: None. IMPRESSION: 1. No acute intracranial abnormality. Stable mild atrophy and chronic microvascular ischemic changes. 2. No acute cervical spine fracture. Moderate cervical spondylosis, progressed since 2010. Electronically Signed   By: Titus Dubin M.D.   On: 08/14/2018 12:38    Pending Labs Unresulted Labs (From admission, onward)    Start     Ordered   08/14/18 1130  Urine culture  STAT,   STAT     08/14/18 1129   Signed and Held  Urine culture  Once,   R     Signed and Held   Signed and Occupational hygienist morning,   R     Signed and Held   Signed and Held  CBC  Tomorrow morning,   R     Signed and Held   Signed and Held  CBC  (heparin)  Once,   R    Comments:  Baseline for heparin therapy IF NOT ALREADY DRAWN.  Notify MD if PLT < 100 K.    Signed and Held   Signed and Held  Creatinine, serum  (heparin)  Once,   R    Comments:  Baseline for heparin therapy IF NOT ALREADY DRAWN.    Signed and Held   Signed and Held  Vitamin B12  Once,   R     Signed and Held   Signed and Held  Folate  Once,   R     Signed and Held          Vitals/Pain Today's Vitals   08/14/18  1545 08/14/18 1552 08/14/18 1553 08/14/18 1554  BP:  (!) 142/66    Pulse: (!) 102 87 82 84  Resp:  16    Temp:      TempSrc:      SpO2: 100% 99% 98% 97%  Weight:      Height:      PainSc:        Isolation Precautions No active isolations  Medications Medications  0.9 %  sodium chloride infusion ( Intravenous New Bag/Given 08/14/18 1555)  fentaNYL (SUBLIMAZE) injection 50 mcg (50 mcg Intravenous Given 08/14/18 1059)  sodium chloride 0.9 % bolus 500 mL (0 mLs Intravenous Stopped 08/14/18 1340)  sodium chloride 0.9 % bolus 500 mL (0 mLs Intravenous Stopped 08/14/18 1518)    Mobility walks with device

## 2018-08-14 NOTE — ED Triage Notes (Signed)
Pt arrived via [facility]. She has lymphoma and is being currently being treated for it. Generalized pain started last fall but 2 days it got worse to the point that she is unable to eat but no dental pain.  Last pain medication was at 6 am of oxycodone hcl/acetaminophen 5-325  Blood work was done within the last week and she received 2 unit PRBCs.

## 2018-08-14 NOTE — ED Provider Notes (Signed)
[facility] DEPT Provider Note   CSN: [id number] Arrival date & time: 08/14/18  0946     History   Chief Complaint Chief Complaint  Patient presents with  . Pain    HPI Erica Escobar is a 82 y.o. female with history of B-cell lymphoma, colon cancer status post partial colectomy, COPD, GERD, diverticulosis, hemorrhoids, HLD, skin cancer presents for evaluation of gradual worsening of chronic pains over the past 2 to 3 days.  She also notes decreased appetite as well as 2 episodes of nonbloody nonbilious emesis yesterday.  She notes lower abdominal pain which radiates "all over".  She has chronic low back pain secondary to her non-Hodgkin's lymphoma which she states has worsened over the past 2 days or so.  Denies radicular pain, numbness, or weakness but does note intermittent numbness and tingling of her toes.  Denies saddle anesthesia.  She is functionally incontinent at baseline.  Denies chest pain or shortness of breath.  No fevers.  Relates with a walker at baseline.  She notes that she was at a rehab facility/SNF for the past 3 weeks or so and was scheduled to be discharged today but she states she did not feel comfortable with discharge.  She states that over the past week or so she has had multiple falls sometimes preceded by lightheadedness.  Notes striking her head occasionally but denies syncope.  She has chronic headaches and presently describes a mild throbbing headache to the right side of her skull in the temporal region.  Denies vision changes or new numbness.  Has been taking her home pain  medicines.  Of note, patient was seen and evaluated in the cancer center on 08/11/2018 3 days ago and was found to be severely neutropenic and anemic and received 2 units of blood that day.  Anemia thought to be secondary to bleeding from colon cancer and surgery.  She has been on Levaquin for the past 3 days for a mouth sore that she noticed 3 weeks ago.  She has  completed radiation therapy but is currently undergoing chemotherapy.  Last infusion was yesterday.   The history is provided by the patient.    Past Medical History:  Diagnosis Date  . Abnormal tympanic membrane    right ear and mastoid problems  . Arthritis    oseoarthritis  . Cancer of ascending colon ([place]) 02/15/2015  . Constipation, chronic   . COPD (chronic obstructive pulmonary disease) (HCC)    mild COPD  . Depression   . Diverticulosis   . GERD (gastroesophageal reflux disease)   . Glaucoma    bilateral-sugery with laser, using eye drops  . Hemorrhoids    Internal  . History of hiatal hernia    dx. '87 Schatzki's ring  . Hyperlipidemia    good control  . Hypertension   . Insomnia    hx. of  . Keratosis, actinic   . Leg cramps   . Macular degeneration   . Neuromuscular disorder (HCC)    neuropathy legs,   . On aspirin at home    chronic use  . PONV (postoperative nausea and vomiting)   . Restless leg syndrome   . Schatzki's ring 1987  . Skin cancer    multiple, "tx. area right anterior wrist"  . Urinary incontinence    occ, stress    Patient Active Problem List   Diagnosis Date Noted  . Acute kidney injury ([place]) 08/14/2018  . Dehydration 08/14/2018  . FTT (failure  to thrive) in adult 08/14/2018  . Acute back pain 07/23/2018  . CKD (chronic kidney disease), stage III ([place]) 07/23/2018  . Anorexia 07/23/2018  . Hyperlipidemia 07/23/2018  . 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 Metastatic cancer ([place]) 07/12/2018  . Chronic back pain 07/12/2018  . Pathologic rib fracture, initial encounter 07/12/2018  . Chest wall mass 07/12/2018  . Cancer of ascending colon s/p robotic colectomy 02/15/2015 02/15/2015  . Restless leg syndrome   . Depression   . Insomnia   . COPD (chronic obstructive pulmonary disease) ([place])   . GERD (gastroesophageal reflux disease)     Past Surgical History:  Procedure Laterality Date  . ABDOMINAL HYSTERECTOMY    . BLADDER  REPAIR    . CARDIAC CATHETERIZATION  2007, 2009  . CATARACT EXTRACTION Bilateral   . CHOLECYSTECTOMY    . COLECTOMY    . FINGER NAIL SURGERY    . FOOT SURGERY Right   . HEMORROIDECTOMY    . INNER EAR SURGERY Right    3-4 times  . INTRAOPERATIVE ARTERIOGRAM     right arm  . KNEE SURGERY Right   . LEG SURGERY Bilateral    laser ablation  . MASTOID DEBRIDEMENT     x2  . SKIN CANCER EXCISION     right/left thumb nail, left face, right ear/neck, right leg  . TOE FUSION    . TONSILLECTOMY       OB History   None      Home Medications    Prior to Admission medications   Medication Sig Start Date End Date Taking? Authorizing Provider  aspirin EC 81 MG tablet Take 81 mg by mouth every morning.   Yes [provider]  cyclobenzaprine (FLEXERIL) 5 MG tablet Take 5 mg by mouth 3 (three) times daily. 06/29/18  Yes [provider]  dexamethasone (DECADRON) 4 MG tablet Take 4 mg by mouth daily.   Yes [provider]  ENSURE (ENSURE) Take 237 mLs by mouth daily. VANILLA OR STRAWBERRY IF AVAILABLE PO DAILY    Yes [provider]  furosemide (LASIX) 20 MG tablet Take 20 mg by mouth daily.    Yes [provider]  gemfibrozil (LOPID) 600 MG tablet Take 600 mg by mouth every morning.   Yes [provider]  levofloxacin (LEVAQUIN) 500 MG tablet Take 1 tablet (500 mg total) by mouth daily. 08/10/18  Yes Owens Shark, NP  loratadine (CLARITIN) 10 MG tablet Take 10 mg by mouth daily as needed for allergies.   Yes [provider]  meloxicam (MOBIC) 15 MG tablet Take 15 mg by mouth daily.  05/30/18  Yes [provider]  mirtazapine (REMERON) 15 MG tablet Take 7.5 mg by mouth at bedtime. 06/29/18  Yes [provider]  multivitamin-lutein (OCUVITE-LUTEIN) CAPS capsule Take 1 capsule by mouth daily.   Yes [provider]  Omega-3 Fatty Acids (FISH OIL) 1000 MG CAPS Take 2,000 mg by mouth daily with breakfast.    Yes  [provider]  oxyCODONE-acetaminophen (PERCOCET) 5-325 MG tablet Take 1 tablet by mouth See admin instructions. Take 1 tablet by mouth every 6 hours scheduled. Take 1 tablet by mouth every 4 hours as needed for breakthrough pain.   Yes [provider]  pantoprazole (PROTONIX) 40 MG tablet Take 40 mg by mouth daily. 03/09/17  Yes [provider]  polyethylene glycol (MIRALAX / GLYCOLAX) packet Take 17 g by mouth daily as needed for moderate constipation.   Yes  [provider]  Polyvinyl Alcohol-Povidone PF (REFRESH) 1.4-0.6 % SOLN Place 6 drops into both eyes daily.   Yes [provider]  potassium chloride (K-DUR) 10 MEQ tablet Take 10 mEq by mouth daily.  06/08/18  Yes [provider]  promethazine (PHENERGAN) 25 MG tablet Take 12.5 mg by mouth every 8 (eight) hours as needed for nausea or vomiting.   Yes [provider]  trolamine salicylate (ASPERCREME) 10 % cream Apply 1 application topically as needed for muscle pain (as needed for back pain).   Yes [provider]  ULORIC 40 MG tablet Take 40 mg by mouth daily.  03/16/18  Yes [provider]  vitamin B-12 (CYANOCOBALAMIN) 1000 MCG tablet Take 1,000 mcg by mouth daily.   Yes [provider]    Family History No family history on file.  Social History Social History   Tobacco Use  . Smoking status: Former Research scientist (life sciences)  . Smokeless tobacco: Never Used  . Tobacco comment: short time x1 - during pregnancy  Substance Use Topics  . Alcohol use: No    Alcohol/week: 0.0 standard drinks  . Drug use: No     Allergies   Azithromycin; Allopurinol; Cephalexin; Codeine; Gabapentin; Niaspan [niacin]; Oxybutynin; Paxil [paroxetine hcl]; Penicillins; Pentazocine; Tizanidine; Tramadol; and Vicodin [hydrocodone-acetaminophen]   Review of Systems Review of Systems  Constitutional: Negative for chills and fever.  Respiratory: Negative for shortness of breath.     Cardiovascular: Negative for chest pain.  Gastrointestinal: Positive for abdominal pain, blood in stool, nausea and vomiting.  Genitourinary: Positive for dysuria. Negative for hematuria.  Musculoskeletal: Positive for back pain.  Neurological: Positive for light-headedness and headaches. Negative for syncope and numbness.  All other systems reviewed and are negative.    Physical Exam Updated Vital Signs BP (!) 142/66   Pulse 84   Temp 98 F (36.7 C) (Oral)   Resp 16   Ht 5' 3"  (1.6 m)   Wt 66.7 kg   SpO2 97%   BMI 26.04 kg/m   Physical Exam  Constitutional: She appears well-developed and well-nourished. No distress.  HENT:  Head: Normocephalic and atraumatic.  Patient edentulous.  5 mm aphthous ulcer noted to the right inferior gingivae   Eyes: Pupils are equal, round, and reactive to light. Conjunctivae and EOM are normal. Right eye exhibits no discharge. Left eye exhibits no discharge.  Neck: Normal range of motion. Neck supple. No JVD present. No tracheal deviation present.  Tenderness to palpation overlying C7 protuberance with bilateral paracervical muscle tenderness. Kyphotic. No deformity, crepitus, or step-off noted.   Cardiovascular: Normal rate, regular rhythm, normal heart sounds and intact distal pulses.  2+ radial and DP/PT pulses bilaterally, Homans sign absent bilaterally,no palpable cords, compartments are soft   Pulmonary/Chest: Effort normal and breath sounds normal. She exhibits tenderness.  Abdominal: Soft. Bowel sounds are normal. She exhibits no distension. There is tenderness in the suprapubic area. There is guarding. There is no rigidity, no rebound, no CVA tenderness and negative Murphy's sign.  Well-healed surgical scar.   Genitourinary:  Genitourinary Comments: Examination performed in the presence of a chaperone.  Patient has a large external hemorrhoid, nontender to palpation and does not appear to be thrombosed.  Not actively bleeding.  No frank  rectal bleeding.  She also has a mild stage II decubitus ulcer in the sacral region superior to the gluteal cleft measuring around 1 cm in diameter.  No surrounding induration or abnormal drainage.  Musculoskeletal: She exhibits no edema.  Tenderness to palpation of the midline thoracic and lumbar regions with right-sided parathoracic and paralumbar muscle tenderness.  No deformity, crepitus, ecchymosis, or flail segment noted.  No step-off noted.  5/5 strength of BUE and BLE major muscle groups.  Neurological: She is alert.  Fluent speech with no evidence of dysarthria or aphasia, no facial droop.  Cranial nerves appear grossly intact.  5/5 strength of BUE and BLE major muscle groups.  Sensation intact to soft touch of face and extremities.    Skin: Skin is warm and dry. No erythema.  Psychiatric: She has a normal mood and affect. Her behavior is normal.  Nursing note and vitals reviewed.    ED Treatments / Results  Labs (all labs ordered are listed, but only abnormal results are displayed) Labs Reviewed  CBC WITH DIFFERENTIAL/PLATELET - Abnormal; Notable for the following components:      Result Value   WBC 12.8 (*)    RBC 3.46 (*)    Hemoglobin 10.7 (*)    HCT 30.8 (*)    Neutro Abs 11.9 (*)    Monocytes Absolute 0.0 (*)    All other components within normal limits  COMPREHENSIVE METABOLIC PANEL - Abnormal; Notable for the following components:   Chloride 96 (*)    BUN 33 (*)    Creatinine, Ser 1.52 (*)    Total Protein 5.7 (*)    Albumin 2.4 (*)    GFR calc non Af Amer 31 (*)    GFR calc Af Amer 36 (*)    All other components within normal limits  POC OCCULT BLOOD, ED - Abnormal; Notable for the following components:   Fecal Occult Bld POSITIVE (*)    All other components within normal limits  URINE CULTURE  URINALYSIS, ROUTINE W REFLEX MICROSCOPIC  LIPASE, BLOOD    EKG EKG Interpretation  Date/Time:  Sunday August 14 2018 10:01:39 EDT Ventricular Rate:  86 PR  Interval:    QRS Duration: 107 QT Interval:  383 QTC Calculation: 459 R Axis:   0 Text Interpretation:  Sinus rhythm Probable left atrial enlargement Low voltage, precordial leads Abnormal R-wave progression, early transition Nonspecific T abnormalities, anterior leads No significant change since last tracing Confirmed by Dorie Rank [phone]) on 08/14/2018 10:07:23 AM   Radiology Dg Chest 2 View  Result Date: 08/14/2018 CLINICAL DATA:  She has lymphoma and is being currently being treated for it. Generalized pain started last fall but 2 days it got worse to the point that she is unable to eat but no dental pain. States she has fallen multiple times. EXAM: CHEST - 2 VIEW COMPARISON:  Chest x-ray on 07/12/2018 FINDINGS: The patient has a RIGHT-sided PICC line, tip overlying the level of the superior vena cava. Shallow lung inflation accentuates heart size. The lungs are clear. No pulmonary edema. Degenerative changes are seen in thoracic spine. IMPRESSION: No active cardiopulmonary disease. Electronically Signed   By: Nolon Nations M.D.   On: 08/14/2018 12:28   Dg Thoracic Spine 4v  Result Date: 08/14/2018 CLINICAL DATA:  She has lymphoma and is being currently being treated for it. Generalized pain started last fall but 2 days it got worse to the point that she is unable to eat but no dental pain. States she has fallen multiple times. EXAM: THORACIC SPINE - 4+ VIEW COMPARISON:  None. FINDINGS: There are mild degenerative changes throughout the thoracic spine. No acute fracture or traumatic subluxation. No suspicious lytic or blastic lesions are identified. RIGHT-sided PICC line  tip overlies the superior vena cava. Surgical clips are in the RIGHT UPPER QUADRANT. IMPRESSION: No evidence for acute  abnormality. Electronically Signed   By: Nolon Nations M.D.   On: 08/14/2018 12:27   Dg Lumbar Spine Complete  Result Date: 08/14/2018 CLINICAL DATA:  Low back pain. Status post fall 2 days ago. Initial  encounter. EXAM: LUMBAR SPINE - COMPLETE 4+ VIEW COMPARISON:  Plain films lumbar spine 05/25/2018. FINDINGS: No fracture is identified. Convex right scoliosis and severe multilevel degenerative disease are stable in appearance. Large stool ball is noted in the rectum. IMPRESSION: No acute abnormality. Convex right scoliosis and advanced multilevel degenerative disease. Large stool ball in the rectum compatible with fecal impaction. Electronically Signed   By: Inge Rise M.D.   On: 08/14/2018 12:28   Ct Head Wo Contrast  Result Date: 08/14/2018 CLINICAL DATA:  Multiple falls last week. EXAM: CT HEAD WITHOUT CONTRAST CT CERVICAL SPINE WITHOUT CONTRAST TECHNIQUE: Multidetector CT imaging of the head and cervical spine was performed following the standard protocol without intravenous contrast. Multiplanar CT image reconstructions of the cervical spine were also generated. COMPARISON:  CT head dated August 08, 2015. CT cervical spine dated March 10, 2009. FINDINGS: CT HEAD FINDINGS Brain: No evidence of acute infarction, hemorrhage, hydrocephalus, extra-axial collection or mass lesion/mass effect. Stable mild atrophy and chronic microvascular ischemic changes. Vascular: Atherosclerotic vascular calcification of the carotid siphons. No hyperdense vessel. Skull: Negative for fracture. Unchanged right parietal bone osteoma. Sinuses/Orbits: No acute finding. Prior right mastoidectomy. Chronic opacification of the right sphenoid sinus. Other: None. CT CERVICAL SPINE FINDINGS Alignment: 3 mm anterolisthesis at C7-T1 due to facet arthropathy. No traumatic malalignment. Skull base and vertebrae: No acute fracture. No primary bone lesion or focal pathologic process. Soft tissues and spinal canal: No prevertebral fluid or swelling. No visible canal hematoma. Disc levels: Moderate multilevel disc height loss and facet uncovertebral hypertrophy throughout the cervical spine Upper chest: Negative. Other: None. IMPRESSION: 1.  No acute intracranial abnormality. Stable mild atrophy and chronic microvascular ischemic changes. 2. No acute cervical spine fracture. Moderate cervical spondylosis, progressed since 2010. Electronically Signed   By: Titus Dubin M.D.   On: 08/14/2018 12:38   Ct Cervical Spine Wo Contrast  Result Date: 08/14/2018 CLINICAL DATA:  Multiple falls last week. EXAM: CT HEAD WITHOUT CONTRAST CT CERVICAL SPINE WITHOUT CONTRAST TECHNIQUE: Multidetector CT imaging of the head and cervical spine was performed following the standard protocol without intravenous contrast. Multiplanar CT image reconstructions of the cervical spine were also generated. COMPARISON:  CT head dated August 08, 2015. CT cervical spine dated March 10, 2009. FINDINGS: CT HEAD FINDINGS Brain: No evidence of acute infarction, hemorrhage, hydrocephalus, extra-axial collection or mass lesion/mass effect. Stable mild atrophy and chronic microvascular ischemic changes. Vascular: Atherosclerotic vascular calcification of the carotid siphons. No hyperdense vessel. Skull: Negative for fracture. Unchanged right parietal bone osteoma. Sinuses/Orbits: No acute finding. Prior right mastoidectomy. Chronic opacification of the right sphenoid sinus. Other: None. CT CERVICAL SPINE FINDINGS Alignment: 3 mm anterolisthesis at C7-T1 due to facet arthropathy. No traumatic malalignment. Skull base and vertebrae: No acute fracture. No primary bone lesion or focal pathologic process. Soft tissues and spinal canal: No prevertebral fluid or swelling. No visible canal hematoma. Disc levels: Moderate multilevel disc height loss and facet uncovertebral hypertrophy throughout the cervical spine Upper chest: Negative. Other: None. IMPRESSION: 1. No acute intracranial abnormality. Stable mild atrophy and chronic microvascular ischemic changes. 2. No acute cervical spine fracture. Moderate cervical spondylosis,  progressed since 2010. Electronically Signed   By: Titus Dubin  M.D.   On: 08/14/2018 12:38    Procedures Procedures (including critical care time)  Medications Ordered in ED Medications  0.9 %  sodium chloride infusion ( Intravenous New Bag/Given 08/14/18 1555)  fentaNYL (SUBLIMAZE) injection 50 mcg (50 mcg Intravenous Given 08/14/18 1059)  sodium chloride 0.9 % bolus 500 mL (0 mLs Intravenous Stopped 08/14/18 1340)  sodium chloride 0.9 % bolus 500 mL (0 mLs Intravenous Stopped 08/14/18 1518)     Initial Impression / Assessment and Plan / ED Course  I have reviewed the triage vital signs and the nursing notes.  Pertinent labs & imaging results that were available during my care of the patient were reviewed by me and considered in my medical decision making (see chart for details).     Patient with generalized weakness, increased falls, suprapubic pain, worsening chronic abdominal pain.  She is afebrile, initially hypertensive with some improvement on reevaluation.  She is nontoxic in appearance.  Recently treated for severe neutropenia and anemia; on reevaluation H&H has improved and she now has a mild leukocytosis.  Lab work shows some evidence of AKI with worsening creatinine.  UA does not show evidence of UTI.  Imaging without evidence of acute abnormality.  EKG with no ischemic changes, doubt ACS/MI.  She is orthostatic.  Guaiac positive stools though she does have a hemorrhoid.  Suspect orthostasis contributing to lightheadedness causing frequent falls.  She appears somewhat physically deconditioned and would benefit from admission to the hospital for further evaluation. 3:29 PM Spoke with Dr. Tana Coast with [facility] hospitalist service who agrees to assume care of patient and bring her into the hospital for further evaluation and management.  Patient seen and evaluated by Dr. Zenia Resides who agrees with assessment and plan at this time. Final Clinical Impressions(s) / ED Diagnoses   Final diagnoses:  Orthostatic hypotension  Non-intractable vomiting with nausea,  unspecified vomiting type  Elevated serum creatinine    ED Discharge Orders    None       Debroah Baller 08/14/18 1615    Lacretia Leigh, MD 08/16/18 1243

## 2018-08-14 NOTE — ED Provider Notes (Signed)
Medical screening examination/treatment/procedure(s) were conducted as a shared visit with non-physician practitioner(s) and myself.  I personally evaluated the patient during the encounter.  EKG Interpretation  Date/Time:  Sunday August 14 2018 10:01:39 EDT Ventricular Rate:  86 PR Interval:    QRS Duration: 107 QT Interval:  383 QTC Calculation: 459 R Axis:   0 Text Interpretation:  Sinus rhythm Probable left atrial enlargement Low voltage, precordial leads Abnormal R-wave progression, early transition Nonspecific T abnormalities, anterior leads No significant change since last tracing Confirmed by Dorie Rank [phone]) on 08/14/2018 10:36:14 AM  82 year old female presents complaining of generalized pain and weakness.  Patient was to be discharged from a staff after a 4-day stay.  Here patient has guaiac positive stools.  Has some evidence of acute kidney injury as well as she was orthostatic has a mild leukocytosis..  Was given IV fluids here and will be admitted to the hospital   Lacretia Leigh, MD 08/14/18 1441

## 2018-08-14 NOTE — ED Notes (Signed)
Pt transported to xray and CT

## 2018-08-14 NOTE — Discharge Instructions (Addendum)
Fecal Impaction A fecal impaction is a large, firm amount of stool (feces) that will not pass out of the body. A fecal impaction usually occurs in the end of the large intestine (rectum). It can block the large intestine and cause significant problems. What are the causes? This condition may be caused by anything that slows down bowel movements, including:  Long-term use of medicines that help you have a bowel movement (laxatives).  Constipation.  Pain in the rectum. Fecal impaction can occur if you avoid having bowel movements due to the pain. Pain in the rectum can result from a medical condition, such as hemorrhoids or anal fissures.  Narcotic pain-relieving medicines, such as methadone, morphine, or codeine.  Not drinking enough fluids.  Being inactive for a long period of time.  Diseases of the brain or nervous system that damage nerves that control the muscles of the intestines.  What are the signs or symptoms? Symptoms of this condition include:  Breathing problems.  Nausea, vomiting, and dehydration.  Dizziness.  Confusion.  Rapid heartbeat.  Fever.  Sweating.  Changes in blood pressure.  Not having a normal number of bowel movements.  Changes in bowel patterns. This may include going to the bathroom less often or not at all.  A sense of fullness in the rectum but being unable to pass stool.  Pain or cramps in the abdominal area. These often happen after meals.  Thin, watery discharge from the rectum.  How is this diagnosed? This condition may be diagnosed based on your symptoms and an exam of your rectum. Sometimes X-rays or lab tests are done to confirm the diagnosis and to check for other problems. How is this treated? This condition may be treated by:  Having your health care provider remove the stool using a gloved finger.  Taking medicine.  A suppository or enema given in the rectum to soften the stool, which can stimulate a bowel  movement.  Follow these instructions at home: Eating and drinking  Drink enough fluid to keep your urine clear or pale yellow.  Include a lot of fiber in your diet. Foods with a lot of fiber include fruits, vegetables, and oatmeal.  If you begin to get constipated, increase the amount of fiber in your diet. General instructions  Develop bowel habits. An example of a bowel habit is having a bowel movement right after breakfast every day. Be sure to give yourself enough time on the toilet. This may require using enemas, bowel softeners, or suppositories at home, as directed by your health care provider. It may also include using mineral oil or olive oil.  Exercise regularly.  Take over-the-counter and prescription medicines only as told by your health care provider. Contact a health care provider if:  You have ongoing pain in your rectum.  You need to use an enema or a suppository more than 2 times a week.  You have rectal bleeding.  You continue to have problems. The problems may include not being able to go to the bathroom and long-term (chronic) constipation.  You have pain in your abdomen.  You have thin, pencil-like stools. Get help right away if:  You have black or tarry stools. This information is not intended to replace advice given to you by your health care provider. Make sure you discuss any questions you have with your health care provider. Document Released: 08/22/2004 Document Revised: 07/03/2016 Document Reviewed: 06/04/2016 Elsevier Interactive Patient Education  [employer].

## 2018-08-14 NOTE — ED Notes (Signed)
Pt states she fell multiple times last week

## 2018-08-15 LAB — BASIC METABOLIC PANEL
Anion gap: 9 (ref 5–15)
BUN: 24 mg/dL — AB (ref 8–23)
CALCIUM: 8 mg/dL — AB (ref 8.9–10.3)
CO2: 31 mmol/L (ref 22–32)
Chloride: 102 mmol/L (ref 98–111)
Creatinine, Ser: 1.01 mg/dL — ABNORMAL HIGH (ref 0.44–1.00)
GFR calc non Af Amer: 51 mL/min — ABNORMAL LOW (ref >60)
GFR, EST AFRICAN AMERICAN: 59 mL/min — AB (ref >60)
Glucose, Bld: 104 mg/dL — ABNORMAL HIGH (ref 70–99)
Potassium: 3.5 mmol/L (ref 3.5–5.1)
SODIUM: 142 mmol/L (ref 135–145)

## 2018-08-15 LAB — CBC
HEMATOCRIT: 27 % — AB (ref 36.0–46.0)
Hemoglobin: 9.3 g/dL — ABNORMAL LOW (ref 12.0–15.0)
MCH: 30.9 pg (ref 26.0–34.0)
MCHC: 34.4 g/dL (ref 30.0–36.0)
MCV: 89.7 fL (ref 78.0–100.0)
Platelets: 188 10*3/uL (ref 150–400)
RBC: 3.01 MIL/uL — AB (ref 3.87–5.11)
RDW: 14.6 % (ref 11.5–15.5)
WBC: 7.6 10*3/uL (ref 4.0–10.5)

## 2018-08-15 LAB — URINE CULTURE: Culture: NO GROWTH

## 2018-08-15 LAB — MRSA PCR SCREENING: MRSA BY PCR: NEGATIVE

## 2018-08-15 MED ORDER — ALBUTEROL SULFATE (2.5 MG/3ML) 0.083% IN NEBU: 2.5000 mg | INHALATION_SOLUTION | RESPIRATORY_TRACT | Status: DC | PRN

## 2018-08-15 MED ORDER — BISACODYL 10 MG RE SUPP
10.0000 mg | Freq: Once | RECTAL | Status: AC
  Administered 2018-08-15: 10 mg via RECTAL
  Filled 2018-08-15: qty 1

## 2018-08-15 MED ORDER — POLYETHYLENE GLYCOL 3350 17 G PO PACK
34.0000 g | PACK | Freq: Once | ORAL | Status: AC
  Administered 2018-08-15: 34 g via ORAL
  Filled 2018-08-15: qty 2

## 2018-08-15 MED ORDER — POLYETHYLENE GLYCOL 3350 17 G PO PACK
17.0000 g | PACK | Freq: Two times a day (BID) | ORAL | Status: DC
  Administered 2018-08-15 – 2018-08-16 (×2): 17 g via ORAL
  Filled 2018-08-15 (×3): qty 1

## 2018-08-15 MED ORDER — DOCUSATE SODIUM 100 MG PO CAPS: 100.0000 mg | ORAL_CAPSULE | Freq: Two times a day (BID) | ORAL | Status: DC

## 2018-08-15 MED ORDER — SENNOSIDES-DOCUSATE SODIUM 8.6-50 MG PO TABS
2.0000 | ORAL_TABLET | Freq: Two times a day (BID) | ORAL | Status: DC
  Administered 2018-08-15 – 2018-08-16 (×3): 2 via ORAL
  Filled 2018-08-15 (×4): qty 2

## 2018-08-15 NOTE — Clinical Social Work Note (Signed)
Clinical Social Work Assessment  Patient Details  Name: Erica Escobar MRN: [id number] Date of Birth: [birth year]  Date of referral:  08/15/18               Reason for consult:  Facility Placement                Permission sought to share information with:  Case Manager, Customer service manager, Family Supports Permission granted to share information::  Yes, Verbal Permission Granted  Name::     Financial controller::     Relationship::  Son  Sport and exercise psychologist Information:     Housing/Transportation Living arrangements for the past 2 months:  [place], [place] of Information:  Patient, Adult Children Patient Interpreter Needed:  None Criminal Activity/Legal Involvement Pertinent to Current Situation/Hospitalization:  No - Comment as needed Significant Relationships:  Adult Children, Warehouse manager Lives with:  Self Do you feel safe going back to the place where you live?  Yes Need for family participation in patient care:  Yes (Comment)  Care giving concerns:  Patient reports that she lives alone. Patient is a 82 year old female with history of B-cell lymphoma, follows Dr. Learta Codding, colon cancer status post partial colectomy, GERD, COPD, hyperlipidemia, diverticulosis presented from SNF with above complaints.    Social Worker assessment / plan:  LCSW consulted for ALF placement.   Patient is from home. Patient was at [facility] for rehab and dc'd a day ago. Patient used 29 days at her past SNF stay.   Patient does not have a payor source for ALF at this time.   LCSW met with patient at bedside. Family is present.  Patient states she is not sure if she will continue chemo. Patient is concerned about the cost and if insurance will continue to pay for it., Patient is from home alone. Son, Erica Escobar reports he plans to complete a medicaid application in case patient needs LTC in the near future.   PLAN: TBD patient was discharged from [facility] 9/1.   Employment status:   Retired Nurse, adult PT Recommendations:  Not assessed at this time Information / Referral to community resources:     Patient/Family's Response to care:  Patient and family are thankful for LCSW visit. Patient and family ask questions regarding chemo cost and LTC. LCSW provided family with information to apply for medicaid.   Patient/Family's Understanding of and Emotional Response to Diagnosis, Current Treatment, and Prognosis:  Patient is concerned about cost of medical bills and SNF stay. Patient reports that if insurance will not cover chemo she cannot afford it. Patients family is proactive in applying for medicaid to prepare for future needs.   Emotional Assessment Appearance:  Appears stated age Attitude/Demeanor/Rapport:    Affect (typically observed):  Accepting, Calm, Pleasant Orientation:  Oriented to Self, Oriented to Place, Oriented to  Time, Oriented to Situation Alcohol / Substance use:  Not Applicable Psych involvement (Current and /or in the community):  No (Comment)  Discharge Needs  Concerns to be addressed:  No discharge needs identified Readmission within the last 30 days:  No Current discharge risk:  None Barriers to Discharge:  Continued Medical Work up   [employer], LCSW 08/15/2018, 10:01 AM

## 2018-08-15 NOTE — Progress Notes (Signed)
[facility] Progress Note  Patient: Erica Escobar VFI:[id number]   PCP: Deland Pretty, MD DOB: 08/25/1936   DOA: 08/14/2018   DOS: 08/15/2018   Date of Service: the patient was seen and examined on 08/15/2018  Subjective: Continues to have fatigue as well as tiredness.  No nausea no vomiting.  Minimal oral intake.  Brief hospital course: Pt. with PMH of B-cell lymphoma, colon cancer S/P colectomy, GERD, COPD, HLD, ; admitted on 08/14/2018, presented with complaint of fatigue and tiredness and dizziness, was found to have dehydration causing orthostatic hypotension as well as acute kidney injury and severe constipation. Currently further plan is continue management of dehydration and constipation.  Assessment and Plan: 1.  Acute kidney injury on chronic kidney disease stage III. Secondary to dehydration and poor p.o. intake. Secondary nausea and vomiting and severe constipation. Patient was also taking Lasix. Continue to hold Lasix, give IV hydration. Renal function back to normal. Monitor.  2.  Constipation with nausea and vomiting and abdominal pain. CT abdomen unremarkable for any significant major abnormality which is acute. Shows that severe constipation. Soapsuds enema not successful. Continue PRN suppository. If no improvement he may require surgical consultation for disimpaction. Continue PRN Zofran. Bowel regimen ordered as well.  3.  Orthostatic hypotension. Recurrent fall. PT OT consulted. We will monitor.  4.  Ascending colon cancer with a robotic colectomy. Follows up with Dr. Ammie Dalton. Monitor.  5 non-Hodgkin's lymphoma. Chronic back pain. Continue pain regimen per home Continue Decadron.   Diet: Regular diet DVT Prophylaxis: subcutaneous Heparin  Advance goals of care discussion: full code  Family Communication: family was present at bedside, at the time of interview. The pt provided permission to discuss medical plan with the family. Opportunity was  given to ask question and all questions were answered satisfactorily.   Disposition:  Discharge to be determined.  Consultants: none Procedures: none  Antibiotics: Anti-infectives (From admission, onward)   None       Objective: Physical Exam: Vitals:   08/14/18 2010 08/14/18 2113 08/15/18 0732 08/15/18 1343  BP: 139/90   (!) 146/88  Pulse: 89   65  Resp: 20   16  Temp: 99 F (37.2 C)   98.4 F (36.9 C)  TempSrc: Oral   Oral  SpO2: 100% 95% 93% 99%  Weight:      Height:        Intake/Output Summary (Last 24 hours) at 08/15/2018 1711 Last data filed at 08/15/2018 0900 Gross per 24 hour  Intake 181.25 ml  Output -  Net 181.25 ml   Filed Weights   08/14/18 0955 08/14/18 1005 08/14/18 1648  Weight: 66.7 kg 66.7 kg 79.4 kg   General: Alert, Awake and Oriented to Time, Place and Person. Appear in moderate distress, affect appropriate Eyes: PERRL, Conjunctiva normal ENT: Oral Mucosa clear moist Neck: no JVD, no Abnormal Mass Or lumps Cardiovascular: S1 and S2 Present, no Murmur, Peripheral Pulses Present Respiratory: normal respiratory effort, Bilateral Air entry equal and Decreased, no use of accessory muscle, Clear to Auscultation, no Crackles, no wheezes Abdomen: Bowel Sound present, Soft and no tenderness, no hernia Skin: no redness, no Rash, no induration Extremities: no Pedal edema, no calf tenderness Neurologic: Grossly no focal neuro deficit. Bilaterally Equal motor strength  Data Reviewed: CBC: Recent Labs  Lab 08/10/18 1500 08/11/18 0817 08/12/18 0833 08/14/18 1050 08/15/18 0556  WBC 0.3* 0.3* 0.9* 12.8* 7.6  NEUTROABS 0.2* 0.1* 0.5* 11.9*  --   HGB 7.8* 7.7* 10.2* 10.7*  9.3*  HCT 28.9* 22.0* 29.2* 30.8* 27.0*  MCV 91.8 93.2 88.5 89.0 89.7  PLT 162 199 155 231 937   Basic Metabolic Panel: Recent Labs  Lab 08/10/18 1500 08/11/18 1138 08/14/18 1050 08/15/18 0556  NA 138 137 140 142  K 3.8 3.4* 4.1 3.5  CL 96* 96* 96* 102  CO2 29 30 31 31     GLUCOSE 159* 106* 85 104*  BUN 38* 41* 33* 24*  CREATININE 1.36* 1.25* 1.52* 1.01*  CALCIUM 8.9 8.8* 8.9 8.0*    Liver Function Tests: Recent Labs  Lab 08/10/18 1500 08/11/18 1138 08/14/18 1050  AST 9* 7* 16  ALT 10 9 14   ALKPHOS 79 70 82  BILITOT 1.0 0.8 0.8  PROT 6.0* 5.5* 5.7*  ALBUMIN 2.6* 2.4* 2.4*   Recent Labs  Lab 08/14/18 1050  LIPASE 23   No results for input(s): AMMONIA in the last 168 hours. Coagulation Profile: No results for input(s): INR, PROTIME in the last 168 hours. Cardiac Enzymes: No results for input(s): CKTOTAL, CKMB, CKMBINDEX, TROPONINI in the last 168 hours. BNP (last 3 results) No results for input(s): PROBNP in the last 8760 hours. CBG: Recent Labs  Lab 08/10/18 1238  GLUCAP 115*   Studies: Ct Abdomen Pelvis Wo Contrast  Result Date: 08/14/2018 CLINICAL DATA:  82 year old female with history of nausea, vomiting and abdominal pain. History of lymphoma and colon cancer. EXAM: CT ABDOMEN AND PELVIS WITHOUT CONTRAST TECHNIQUE: Multidetector CT imaging of the abdomen and pelvis was performed following the standard protocol without IV contrast. COMPARISON:  CT the abdomen and pelvis 07/12/2018. PET-CT 08/10/2018. FINDINGS: Lower chest: Calcifications of the aortic valve. Atherosclerotic calcifications in the descending thoracic aorta. Hepatobiliary: No definite cystic or solid hepatic lesions are noted on today's noncontrast CT examination. Status post cholecystectomy. Pancreas: No pancreatic mass or peripancreatic fluid or inflammatory changes are noted on today's noncontrast CT examination. Spleen: Calcified granuloma in the spleen.  Otherwise, unremarkable. Adrenals/Urinary Tract: Unenhanced appearance of kidneys and bilateral adrenal glands is normal. No hydroureteronephrosis. Urinary bladder is normal in appearance. Stomach/Bowel: Normal appearance of the stomach. No pathologic dilatation of small bowel or colon. Very large burden of well-formed  stool in the rectum measuring 9.0 x 9.2 x 10.5 cm, strongly suggesting fecal impaction. Numerous colonic diverticulae are noted, without surrounding inflammatory changes to suggest an acute diverticulitis at this time. Postoperative changes of right hemicolectomy. Vascular/Lymphatic: Aortic atherosclerosis. No lymphadenopathy noted in the abdomen or pelvis. Reproductive: Status post hysterectomy. Ovaries are not confidently identified may be surgically absent or atrophic. Other: No significant volume of ascites.  No pneumoperitoneum. Musculoskeletal: Again noted is a soft tissue mass in the inferior aspect of the right chest wall posteriorly (axial image 17 of series 2 and coronal image 79 of series 5) measuring 3.6 x 5.0 x 8.6 cm, smaller than the prior examination, with some destructive changes in the posterior aspect of the right tenth and eleventh ribs where there are pathologic fractures. There are no other aggressive appearing lytic or blastic lesions noted in the visualized portions of the skeleton. IMPRESSION: 1. Fecal impaction in the distal rectum. 2. No other acute findings are noted elsewhere in the abdomen or pelvis. 3. Colonic diverticulosis without evidence of acute diverticulitis at this time. 4. Interval decreased size of soft tissue mass in the inferior aspect of the right posterior chest wall which currently measures 3.6 x 5.0 x 8.6 cm. 5. Aortic atherosclerosis. 6. There are calcifications of the aortic valve. Echocardiographic correlation  for evaluation of potential valvular dysfunction may be warranted if clinically indicated. 7. Additional incidental findings, as above. Electronically Signed   By: Vinnie Langton M.D.   On: 08/14/2018 20:21    Scheduled Meds: . cyclobenzaprine  5 mg Oral TID  . dexamethasone  4 mg Oral Daily  . febuxostat  40 mg Oral Daily  . feeding supplement (ENSURE ENLIVE)  237 mL Oral BID BM  . gemfibrozil  600 mg Oral q morning - 10a  . heparin  5,000 Units  Subcutaneous Q8H  . ipratropium  0.5 mg Nebulization BID  . mirtazapine  7.5 mg Oral QHS  . multivitamin  1 tablet Oral Daily  . pantoprazole  40 mg Oral Daily  . polyethylene glycol  17 g Oral BID  . senna-docusate  2 tablet Oral BID  . sodium chloride flush  10-40 mL Intracatheter Q12H  . vitamin B-12  1,000 mcg Oral Daily   Continuous Infusions: . sodium chloride 75 mL/hr at 08/15/18 0802   PRN Meds: acetaminophen **OR** acetaminophen, fentaNYL (SUBLIMAZE) injection, loratadine, ondansetron **OR** ondansetron (ZOFRAN) IV, polyvinyl alcohol, sodium chloride flush  Time spent: 35 minutes  Author: Berle Mull, MD [facility] Pager: [phone] 08/15/2018 5:11 PM  If 7PM-7AM, please contact night-coverage at [email or web address], password [facility]

## 2018-08-15 NOTE — Progress Notes (Signed)
Soap sud enema and disimpaction completed. Pt tolerated well. Small amount of stool returned. Will continue to monitor.

## 2018-08-15 NOTE — Care Management Note (Signed)
Case Management Note  Patient Details  Name: Erica Escobar MRN: [id number] Date of Birth: Jan 27, 1936  Subjective/Objective:                  Patient is a 82 year old female with history of B-cell lymphoma, follows Dr. Learta Codding, colon cancer status post partial colectomy, GERD, COPD, hyperlipidemia, diverticulosis presented from SNF with above complaints.  History was obtained from the patient and family members at the bedside who reported that patient was being discharged from the skilled nursing facility today as she had completed her days of rehab.  However patient felt very weak, dizzy and lightheaded and requested to bring her to the hospital.  Patient reported decreased appetite, 2 episodes of emesis yesterday.  Also reports lower abdominal crampy pain diffusely.  Feels generalized weakness and dizziness with multiple falls in the last few weeks. Patient has history of chronic low back pain otherwise denies any numbness or focal weakness.  Patient was evaluated at the cancer center on 8/29 and was found to be severely neutropenic and anemic.  Patient received 2 units of blood and granix injection.  Temp 97.9, respiratory rate 18, pulse 86, BP 157/72, orthostatic.  BP 78/30 on standing up CMET showed sodium of 140, BUN 33, creatinine 1.5 and was 1.2 on 8/29, 1.0 on 8/20 CBC showed WBCs 12.8 (was 0.9 on 8/30), hemoglobin 10.7, hematocrit 30.8 UA negative for UTI Assessment/Plan Principal Problem:   Acute kidney injury ([place]) post on CKD stage III -Likely due to dehydration, nausea and vomiting, medications  including furosemide, meloxicam -Hold Lasix, placed on gentle IV fluid hydration -Baseline creatinine 1.0-1.2, presenting with creatinine of 1.5   Active Problems: Nausea and vomiting with abd pain -placed  on Zofran, IV fluid hydration, obtain CT abdomen for further work-up -Continue Protonix, soft diet  Falls with failure to thrive -Prior to STR, patient was living alone, she  was being discharged from the rehab today. -Will obtain PT OT evaluation, may need higher level of care  Dehydration with orthostatic hypotension -Continue IV fluid hydration, hold furosemide -Continue B12, folate, orthostatic vital signs in a.m.    Cancer of ascending colon s/p robotic colectomy 02/15/2015 -Followed by Dr. Learta Codding, pathology showed poorly differentiated adenocarcinoma -Received 2 units packed RBC transfusion and granix on 8/29  Non-Hodgkin lymphoma with chronic back pain -Continue pain control, PT OT evaluation -Continue Decadron    COPD (chronic obstructive pulmonary disease) (HCC) -Currently stable, no wheezing    Hyperlipidemia -Continue gemfibrozil   DVT prophylaxis: Heparin subcu  CODE STATUS: Full CODE STATUS  Consults called: None  Action/Plan: Discharge Criteria Return to top of Urologic Disease GRG - GRG [Expand All / Collapse All]  Continued inpatient stay is needed until 1 or more of the following are present: ? Acceptable patient status for next level of care is achieved. ? ALL of the following are present:   Renal function acceptable no    Urinary status acceptable no    No infection, or status acceptable aCCEPTable   Systemic disease absent or stable (eg, vasculitis) no   Acute complications absent  none   Activity level acceptable  bed rest   Intake acceptable no   No inpatient interventions needed  iv ns at 75cc/hrs Labs-bun 24/creat. 1.01 grf both decreased   General Discharge Criteria met not at this time   Expected Discharge Date:  08/15/18               Expected Discharge Plan:  Skilled Nursing  Facility  In-House Referral:  Clinical Social Work  Ship broker  CM Consult  Post Acute Care Choice:    Choice offered to:     DME Arranged:    DME Agency:     HH Arranged:    HH Agency:     Status of Service:  In process, will continue to follow  If discussed at Long Length of Stay  Meetings, dates discussed:    Additional Comments:  Leeroy Cha, RN 08/15/2018, 12:15 PM

## 2018-08-16 ENCOUNTER — Inpatient Hospital Stay (HOSPITAL_COMMUNITY): Payer: Medicare Other

## 2018-08-16 ENCOUNTER — Telehealth: Payer: Self-pay | Admitting: *Deleted

## 2018-08-16 ENCOUNTER — Inpatient Hospital Stay: Payer: Medicare Other

## 2018-08-16 LAB — CBC
HEMATOCRIT: 26 % — AB (ref 36.0–46.0)
Hemoglobin: 8.9 g/dL — ABNORMAL LOW (ref 12.0–15.0)
MCH: 31 pg (ref 26.0–34.0)
MCHC: 34.2 g/dL (ref 30.0–36.0)
MCV: 90.6 fL (ref 78.0–100.0)
PLATELETS: 181 10*3/uL (ref 150–400)
RBC: 2.87 MIL/uL — ABNORMAL LOW (ref 3.87–5.11)
RDW: 14.9 % (ref 11.5–15.5)
WBC: 6.8 10*3/uL (ref 4.0–10.5)

## 2018-08-16 LAB — BASIC METABOLIC PANEL
Anion gap: 7 (ref 5–15)
BUN: 19 mg/dL (ref 8–23)
CO2: 30 mmol/L (ref 22–32)
CREATININE: 0.9 mg/dL (ref 0.44–1.00)
Calcium: 8.2 mg/dL — ABNORMAL LOW (ref 8.9–10.3)
Chloride: 108 mmol/L (ref 98–111)
GFR calc Af Amer: >60 mL/min (ref >60)
GFR, EST NON AFRICAN AMERICAN: 59 mL/min — AB (ref >60)
GLUCOSE: 94 mg/dL (ref 70–99)
POTASSIUM: 3.3 mmol/L — AB (ref 3.5–5.1)
Sodium: 145 mmol/L (ref 135–145)

## 2018-08-16 LAB — MAGNESIUM: Magnesium: 1.9 mg/dL (ref 1.7–2.4)

## 2018-08-16 MED ORDER — POTASSIUM CHLORIDE CRYS ER 20 MEQ PO TBCR
40.0000 meq | EXTENDED_RELEASE_TABLET | Freq: Once | ORAL | Status: AC
  Administered 2018-08-16: 40 meq via ORAL
  Filled 2018-08-16: qty 2

## 2018-08-16 NOTE — Telephone Encounter (Signed)
TC from pt's daughter-in-law, Lawerance Bach). She states that pt is currently in patient @ [facility] with bowel obstruction. Vaughan Basta wanted to cancel today's lab appt d/t hospitalization.  Appt cancelled.

## 2018-08-16 NOTE — Progress Notes (Signed)
[facility] Progress Note  Patient: Erica Escobar FKC:[id number]   PCP: Deland Pretty, MD DOB: 03-08-1936   DOA: 08/14/2018   DOS: 08/16/2018   Date of Service: the patient was seen and examined on 08/16/2018  Subjective: Continues to have fatigue.  Has loose watery BM no blood.  Brief hospital course: Pt. with PMH of B-cell lymphoma, colon cancer S/P colectomy, GERD, COPD, HLD, ; admitted on 08/14/2018, presented with complaint of fatigue and tiredness and dizziness, was found to have dehydration causing orthostatic hypotension as well as acute kidney injury and severe constipation. Currently further plan is continue management of dehydration and constipation.  Assessment and Plan: 1.  Acute kidney injury on chronic kidney disease stage III. Secondary to dehydration and poor p.o. intake. Secondary nausea and vomiting and severe constipation. Patient was also taking Lasix. Continue to hold Lasix, give IV hydration. Renal function back to normal. Monitor.  2.  Severe constipation with nausea and vomiting and abdominal pain. Rectal impaction CT abdomen unremarkable for any significant major abnormality which is acute. Shows that severe constipation. Soapsuds enema not successful. Continue PRN suppository. Repeat x-ray this morning is reassuring, rectal stool ball is actually getting smaller in size.  We will repeat another one tomorrow. If no improvement he may require surgical consultation for disimpaction. Continue PRN Zofran. Bowel regimen ordered as well.  3.  Orthostatic hypotension. Recurrent fall. PT OT consulted. We will monitor.  4.  Ascending colon cancer with a robotic colectomy. Follows up with Dr. Ammie Dalton. Monitor.  5 non-Hodgkin's lymphoma. Chronic back pain. Continue pain regimen per home Continue Decadron.  Diet: Regular diet DVT Prophylaxis: subcutaneous Heparin  Advance goals of care discussion: full code  Family Communication: family was present at  bedside, at the time of interview. The pt provided permission to discuss medical plan with the family. Opportunity was given to ask question and all questions were answered satisfactorily.   Disposition:  Discharge to be determined.  PT consulted  Consultants: none Procedures: none  Antibiotics: Anti-infectives (From admission, onward)   None       Objective: Physical Exam: Vitals:   08/15/18 1343 08/15/18 2052 08/15/18 2209 08/16/18 0430  BP: (!) 146/88  (!) 141/118 (!) 166/68  Pulse: 65  63 (!) 57  Resp: 16   20  Temp: 98.4 F (36.9 C)  98 F (36.7 C) 97.7 F (36.5 C)  TempSrc: Oral  Oral Oral  SpO2: 99% 98% 97% 96%  Weight:      Height:        Intake/Output Summary (Last 24 hours) at 08/16/2018 1738 Last data filed at 08/16/2018 1300 Gross per 24 hour  Intake 3050.4 ml  Output -  Net 3050.4 ml   Filed Weights   08/14/18 0955 08/14/18 1005 08/14/18 1648  Weight: 66.7 kg 66.7 kg 79.4 kg   General: Alert, Awake and Oriented to Time, Place and Person. Appear in moderate distress, affect appropriate Eyes: PERRL, Conjunctiva normal ENT: Oral Mucosa clear moist Neck: no JVD, no Abnormal Mass Or lumps Cardiovascular: S1 and S2 Present, no Murmur, Peripheral Pulses Present Respiratory: normal respiratory effort, Bilateral Air entry equal and Decreased, no use of accessory muscle, Clear to Auscultation, no Crackles, no wheezes Abdomen: Bowel Sound present, Soft and no tenderness, no hernia Skin: no redness, no Rash, no induration Extremities: no Pedal edema, no calf tenderness Neurologic: Grossly no focal neuro deficit. Bilaterally Equal motor strength  Data Reviewed: CBC: Recent Labs  Lab 08/10/18 1500 08/11/18 0817 08/12/18  4114 08/14/18 1050 08/15/18 0556 08/16/18 0335  WBC 0.3* 0.3* 0.9* 12.8* 7.6 6.8  NEUTROABS 0.2* 0.1* 0.5* 11.9*  --   --   HGB 7.8* 7.7* 10.2* 10.7* 9.3* 8.9*  HCT 28.9* 22.0* 29.2* 30.8* 27.0* 26.0*  MCV 91.8 93.2 88.5 89.0 89.7 90.6    PLT 162 199 155 231 188 643   Basic Metabolic Panel: Recent Labs  Lab 08/10/18 1500 08/11/18 1138 08/14/18 1050 08/15/18 0556 08/16/18 0335  NA 138 137 140 142 145  K 3.8 3.4* 4.1 3.5 3.3*  CL 96* 96* 96* 102 108  CO2 29 30 31 31 30   GLUCOSE 159* 106* 85 104* 94  BUN 38* 41* 33* 24* 19  CREATININE 1.36* 1.25* 1.52* 1.01* 0.90  CALCIUM 8.9 8.8* 8.9 8.0* 8.2*  MG  --   --   --   --  1.9    Liver Function Tests: Recent Labs  Lab 08/10/18 1500 08/11/18 1138 08/14/18 1050  AST 9* 7* 16  ALT 10 9 14   ALKPHOS 79 70 82  BILITOT 1.0 0.8 0.8  PROT 6.0* 5.5* 5.7*  ALBUMIN 2.6* 2.4* 2.4*   Recent Labs  Lab 08/14/18 1050  LIPASE 23   No results for input(s): AMMONIA in the last 168 hours. Coagulation Profile: No results for input(s): INR, PROTIME in the last 168 hours. Cardiac Enzymes: No results for input(s): CKTOTAL, CKMB, CKMBINDEX, TROPONINI in the last 168 hours. BNP (last 3 results) No results for input(s): PROBNP in the last 8760 hours. CBG: Recent Labs  Lab 08/10/18 1238  GLUCAP 115*   Studies: Dg Abd Portable 1v  Result Date: 08/16/2018 CLINICAL DATA:  Constipation EXAM: PORTABLE ABDOMEN - 1 VIEW COMPARISON:  None. FINDINGS: The bowel gas pattern is normal. No radio-opaque calculi or other significant radiographic abnormality are seen. Dextroscoliosis of the thoracolumbar spine. IMPRESSION: Negative. Electronically Signed   By: Kathreen Devoid   On: 08/16/2018 11:28    Scheduled Meds: . cyclobenzaprine  5 mg Oral TID  . dexamethasone  4 mg Oral Daily  . febuxostat  40 mg Oral Daily  . feeding supplement (ENSURE ENLIVE)  237 mL Oral BID BM  . gemfibrozil  600 mg Oral q morning - 10a  . heparin  5,000 Units Subcutaneous Q8H  . mirtazapine  7.5 mg Oral QHS  . multivitamin  1 tablet Oral Daily  . pantoprazole  40 mg Oral Daily  . polyethylene glycol  17 g Oral BID  . senna-docusate  2 tablet Oral BID  . sodium chloride flush  10-40 mL Intracatheter Q12H  .  vitamin B-12  1,000 mcg Oral Daily   Continuous Infusions: . sodium chloride 75 mL/hr at 08/16/18 0552   PRN Meds: acetaminophen **OR** acetaminophen, albuterol, fentaNYL (SUBLIMAZE) injection, loratadine, ondansetron **OR** ondansetron (ZOFRAN) IV, polyvinyl alcohol, sodium chloride flush  Time spent: 35 minutes  Author: Berle Mull, MD [facility] Pager: [phone] 08/16/2018 5:38 PM  If 7PM-7AM, please contact night-coverage at [email or web address], password [email or web address]

## 2018-08-16 NOTE — Progress Notes (Signed)
Initial Nutrition Assessment  DOCUMENTATION CODES:   Obesity unspecified  INTERVENTION:    Ensure Enlive po BID, each supplement provides 350 kcal and 20 grams of protein  Provide MVI daily  NUTRITION DIAGNOSIS:   Increased nutrient needs related to wound healing as evidenced by estimated needs.  GOAL:   Patient will meet greater than or equal to 90% of their needs  MONITOR:   PO intake, Supplement acceptance, Weight trends, Labs  REASON FOR ASSESSMENT:   Malnutrition Screening Tool    ASSESSMENT:   Patient with PMH significant for B-cell lymphoma, follows Dr. Learta Codding, colon cancer s/p partial colectomy, GERD, COPD, hyperlipidemia, and diverticulosis. Presents this admission with nausea/vomting with generalized weakness. Admitted for AKI on CKD III and falls with failure to thrive.    Pt reports her appetite has remained poor since her last admission at the beginning of August. States during this time period she tolerated a couple bites of one meal (usually a deli meat sandwich) and often felt too nauseous to finish it. She was recently prescribed an appetite stimulant (Remeron) but states she hasn't noticed a difference in her appetite. She continues to have issues swallowing dry foods. She consumed 75% of her eggs, fruit, and grits this morning but claims she felt nauseous immediatly after. Encouraged pt to eat small more frequent meals throughout the day and to continue drinking protein supplementation while her appetite is poor. Pt amendable to Ensure this stay.   Pt endorses a UBW of 160 lb and a recent wt loss of 15 lb. Records indicate pt gained weight from her last admission 151 lb on 8/1 to 175 lb and this admission. Unsure if this is bed scale error. Will need to attempt a new standing weight. Nutrition-Focused physical exam completed. Pt does not walk much per her reports. She shows to have moderate muscle depletion in bilateral lower extremities.   Medications  reviewed and include: Remeron, MVI, Vit B12 Labs reviewed: K 3.3 (L)   NUTRITION - FOCUSED PHYSICAL EXAM:    Most Recent Value  Orbital Region  No depletion  Upper Arm Region  No depletion  Thoracic and Lumbar Region  No depletion  Buccal Region  No depletion  Temple Region  No depletion  Clavicle Bone Region  No depletion  Clavicle and Acromion Bone Region  No depletion  Scapular Bone Region  No depletion  Dorsal Hand  No depletion  Patellar Region  Moderate depletion  Anterior Thigh Region  Moderate depletion  Posterior Calf Region  Moderate depletion  Edema (RD Assessment)  Mild     Diet Order:   Diet Order            DIET SOFT Room service appropriate? Yes; Fluid consistency: Thin  Diet effective now              EDUCATION NEEDS:   Education needs have been addressed  Skin:  Skin Assessment: Skin Integrity Issues: Skin Integrity Issues:: Stage II Stage II: sacrum  Last BM:  08/15/18  Height:   Ht Readings from Last 1 Encounters:  08/14/18 5' 3"  (1.6 m)    Weight:   Wt Readings from Last 1 Encounters:  08/14/18 79.4 kg    Ideal Body Weight:  52.3 kg  BMI:  Body mass index is 31 kg/m.  Estimated Nutritional Needs:   Kcal:  1600-1800 kcal  Protein:  75-90 grams  Fluid:  >/= 1.6 L/day  Mariana Single RD, LDN Clinical Nutrition Pager # - [phone]

## 2018-08-17 DIAGNOSIS — N179 Acute kidney failure, unspecified: Principal | ICD-10-CM

## 2018-08-17 DIAGNOSIS — R627 Adult failure to thrive: Secondary | ICD-10-CM

## 2018-08-17 DIAGNOSIS — N183 Chronic kidney disease, stage 3 (moderate): Secondary | ICD-10-CM

## 2018-08-17 DIAGNOSIS — K219 Gastro-esophageal reflux disease without esophagitis: Secondary | ICD-10-CM

## 2018-08-17 DIAGNOSIS — J410 Simple chronic bronchitis: Secondary | ICD-10-CM

## 2018-08-17 DIAGNOSIS — C182 Malignant neoplasm of ascending colon: Secondary | ICD-10-CM

## 2018-08-17 DIAGNOSIS — R7989 Other specified abnormal findings of blood chemistry: Secondary | ICD-10-CM

## 2018-08-17 DIAGNOSIS — E860 Dehydration: Secondary | ICD-10-CM

## 2018-08-17 LAB — BASIC METABOLIC PANEL
ANION GAP: 8 (ref 5–15)
BUN: 15 mg/dL (ref 8–23)
CALCIUM: 8 mg/dL — AB (ref 8.9–10.3)
CHLORIDE: 109 mmol/L (ref 98–111)
CO2: 27 mmol/L (ref 22–32)
Creatinine, Ser: 0.82 mg/dL (ref 0.44–1.00)
GFR calc non Af Amer: >60 mL/min (ref >60)
GLUCOSE: 90 mg/dL (ref 70–99)
POTASSIUM: 4 mmol/L (ref 3.5–5.1)
Sodium: 144 mmol/L (ref 135–145)

## 2018-08-17 LAB — CBC
HEMATOCRIT: 26.2 % — AB (ref 36.0–46.0)
Hemoglobin: 9 g/dL — ABNORMAL LOW (ref 12.0–15.0)
MCH: 31 pg (ref 26.0–34.0)
MCHC: 34.4 g/dL (ref 30.0–36.0)
MCV: 90.3 fL (ref 78.0–100.0)
Platelets: 197 10*3/uL (ref 150–400)
RBC: 2.9 MIL/uL — AB (ref 3.87–5.11)
RDW: 14.9 % (ref 11.5–15.5)
WBC: 6.9 10*3/uL (ref 4.0–10.5)

## 2018-08-17 MED ORDER — DEXAMETHASONE 4 MG PO TABS: 4.0000 mg | ORAL_TABLET | Freq: Every day | ORAL | 0 refills | Status: DC

## 2018-08-17 MED ORDER — HEPARIN SOD (PORK) LOCK FLUSH 100 UNIT/ML IV SOLN
250.0000 [IU] | INTRAVENOUS | Status: AC | PRN
  Administered 2018-08-17: 250 [IU]

## 2018-08-17 MED ORDER — BISACODYL 10 MG RE SUPP
10.0000 mg | Freq: Once | RECTAL | Status: DC
  Filled 2018-08-17: qty 1

## 2018-08-17 NOTE — Progress Notes (Signed)
Pt PICC flushed and capped for home. Pt AVS reviewed and medication reviewed at bedside. MD notified and prescriptions ordered. AVS modified. Son will transport home.Case management set up [facility] needs.  Kizzie Ide, RN

## 2018-08-17 NOTE — Progress Notes (Signed)
Patient is being discharged home. Discharge instructions were given to patient

## 2018-08-17 NOTE — Discharge Summary (Addendum)
Discharge Summary  Erica Escobar JTT:[id number] DOB: 04/30/1936  PCP: Deland Pretty, MD  Admit date: 08/14/2018 Discharge date: 08/17/2018  Time spent: 25 minutes  Recommendations for Outpatient Follow-up:  1. Follow-up with your PCP 2. Follow-up with your oncologist 3. Take your medications as prescribed 4. Fall precautions/PT recommends SNF.  Patient declines SNF  Discharge Diagnoses:  Active Hospital Problems   Diagnosis Date Noted  . Acute kidney injury ([place]) 08/14/2018  . Dehydration 08/14/2018  . FTT (failure to thrive) in adult 08/14/2018  . Pressure injury of skin 08/14/2018  . CKD (chronic kidney disease), stage III ([place]) 07/23/2018  . Hyperlipidemia 07/23/2018  . Large cell lymphoma ([place]) 07/20/2018  . Cancer of ascending colon s/p robotic colectomy 02/15/2015 02/15/2015  . COPD (chronic obstructive pulmonary disease) ([place])   . GERD (gastroesophageal reflux disease)     Resolved Hospital Problems  No resolved problems to display.    Discharge Condition: Stable  Diet recommendation: Resume previous diet  Vitals:   08/17/18 0450 08/17/18 1339  BP: (!) 174/68 (!) 164/81  Pulse: (!) 57 79  Resp: 19 20  Temp: 98.7 F (37.1 C) 98.3 F (36.8 C)  SpO2: 99% 99%    History of present illness:  Pt. with PMH of B-cell lymphoma, colon cancer S/P colectomy, GERD, COPD, HLD, admitted on 08/14/2018, presented with complaint of fatigue and tiredness and dizziness, was found to have dehydration causing orthostatic hypotension as well as acute kidney injury and severe constipation. Currently further plan is continue management of dehydration and constipation.   08/17/2018: Patient seen and examined at bedside.  She states she feels better and weakness has improved.  She has had multiple bowel movements yesterday and today.  Last abdominal x-ray unremarkable.  Denies nausea is tolerating a diet well.  Afebrile with no leukocytosis.  On the day of discharge, the patient was  hemodynamically stable.  She will need to follow-up with primary care provider, oncologist post hospitalization.  Hospital Course:  Principal Problem:   Acute kidney injury [facility]) Active Problems:   Cancer of ascending colon s/p robotic colectomy 02/15/2015   COPD (chronic obstructive pulmonary disease) ([facility])   GERD (gastroesophageal reflux disease)   Large cell lymphoma ([facility])   CKD (chronic kidney disease), stage III ([facility])   Hyperlipidemia   Dehydration   FTT (failure to thrive) in adult   Pressure injury of skin  AKI, resolved Creatinine back to baseline 0.8 with GFR greater than 60 Continue to avoid nephrotoxic agents and dehydration Follow-up with your PCP outpatient  Resolved fecal impaction in the setting of chronic constipation Had multiple stools yesterday and today X-ray done on 08/16/2018 unremarkable Resume MiraLAX daily tomorrow 08/18/2018  Chronic normocytic anemia Hemoglobin is stable at 9 No sign of overt bleeding  Generalized weakness/physical debility, improving Continue to increase p.o. calorie intake Continue oral supplement  Orthostatic hypotension with recurrent fall Avoid dehydration Hold Lasix due to positive orthostatic vital signs PT recommends SNF Social work consulted for placement Fall precautions  Ambulatory dysfunction PT recommends SNF Patient declines SNF Fall precautions Continue PT and OT at home    Procedures:  None  Consultations:  None  Discharge Exam: BP (!) 164/81   Pulse 79   Temp 98.3 F (36.8 C) (Oral)   Resp 20   Ht 5' 3"  (1.6 m)   Wt 79.4 kg   SpO2 99%   BMI 31.00 kg/m  . General: 82 y.o. year-old female well developed well nourished in no acute distress.  Alert and oriented x3. . Cardiovascular: Regular rate and rhythm with no rubs or gallops.  No thyromegaly or JVD noted.   Marland Kitchen Respiratory: Clear to auscultation with no wheezes or rales. Good inspiratory effort. . Abdomen: Soft nontender nondistended with  normal bowel sounds x4 quadrants. . Musculoskeletal: No lower extremity edema. 2/4 pulses in all 4 extremities. . Skin: No ulcerative lesions noted or rashes, . Psychiatry: Mood is appropriate for condition and setting  Discharge Instructions You were cared for by a hospitalist during your hospital stay. If you have any questions about your discharge medications or the care you received while you were in the hospital after you are discharged, you can call the unit and asked to speak with the hospitalist on call if the hospitalist that took care of you is not available. Once you are discharged, your primary care physician will handle any further medical issues. Please note that NO REFILLS for any discharge medications will be authorized once you are discharged, as it is imperative that you return to your primary care physician (or establish a relationship with a primary care physician if you do not have one) for your aftercare needs so that they can reassess your need for medications and monitor your lab values.   Allergies as of 08/17/2018      Reactions   Azithromycin Itching   Allopurinol Other (See Comments)   Unknown   Cephalexin Other (See Comments)   Unknown   Codeine Itching   Gabapentin Swelling, Other (See Comments)   Throat and leg swelling   Niaspan [niacin] Other (See Comments)   Unknown   Oxybutynin Other (See Comments)   "made me go crazy"-- had crazy dreams.    Paxil [paroxetine Hcl] Other (See Comments)   Unknown reaction   Penicillins Itching, Other (See Comments)   Has patient had a PCN reaction causing immediate rash, facial/tongue/throat swelling, SOB or lightheadedness with hypotension: No Has patient had a PCN reaction causing severe rash involving mucus membranes or skin necrosis: Yes Has patient had a PCN reaction that required hospitalization: No Has patient had a PCN reaction occurring within the last 10 years: No If all of the above answers are "NO", then may  proceed with Cephalosporin use.   Pentazocine Itching, Nausea Only, Other (See Comments)   Headache.    Tizanidine Other (See Comments)   "made me crazy"   Tramadol Other (See Comments)   Head feels "swimmy" and weakness   Vicodin [hydrocodone-acetaminophen] Other (See Comments)   "made me go crazy"      Medication List    STOP taking these medications   cyclobenzaprine 5 MG tablet Commonly known as:  FLEXERIL   furosemide 20 MG tablet Commonly known as:  LASIX     TAKE these medications   aspirin EC 81 MG tablet Take 81 mg by mouth every morning.   dexamethasone 4 MG tablet Commonly known as:  DECADRON Take 1 tablet (4 mg total) by mouth daily.   ENSURE Take 237 mLs by mouth daily. VANILLA OR STRAWBERRY IF AVAILABLE PO DAILY   Fish Oil 1000 MG Caps Take 2,000 mg by mouth daily with breakfast.   gemfibrozil 600 MG tablet Commonly known as:  LOPID Take 600 mg by mouth every morning.   levofloxacin 500 MG tablet Commonly known as:  LEVAQUIN Take 1 tablet (500 mg total) by mouth daily.   loratadine 10 MG tablet Commonly known as:  CLARITIN Take 10 mg by mouth daily as needed for  allergies.   meloxicam 15 MG tablet Commonly known as:  MOBIC Take 15 mg by mouth daily.   mirtazapine 15 MG tablet Commonly known as:  REMERON Take 7.5 mg by mouth at bedtime.   multivitamin-lutein Caps capsule Take 1 capsule by mouth daily.   pantoprazole 40 MG tablet Commonly known as:  PROTONIX Take 40 mg by mouth daily.   PERCOCET 5-325 MG tablet Generic drug:  oxyCODONE-acetaminophen Take 1 tablet by mouth See admin instructions. Take 1 tablet by mouth every 6 hours scheduled. Take 1 tablet by mouth every 4 hours as needed for breakthrough pain.   polyethylene glycol packet Commonly known as:  MIRALAX / GLYCOLAX Take 17 g by mouth daily as needed for moderate constipation.   potassium chloride 10 MEQ tablet Commonly known as:  K-DUR Take 10 mEq by mouth daily.     promethazine 25 MG tablet Commonly known as:  PHENERGAN Take 12.5 mg by mouth every 8 (eight) hours as needed for nausea or vomiting.   REFRESH 1.4-0.6 % Soln Generic drug:  Polyvinyl Alcohol-Povidone PF Place 6 drops into both eyes daily.   trolamine salicylate 10 % cream Commonly known as:  ASPERCREME Apply 1 application topically as needed for muscle pain (as needed for back pain).   ULORIC 40 MG tablet Generic drug:  febuxostat Take 40 mg by mouth daily.   vitamin B-12 1000 MCG tablet Commonly known as:  CYANOCOBALAMIN Take 1,000 mcg by mouth daily.      Allergies  Allergen Reactions  . Azithromycin Itching  . Allopurinol Other (See Comments)    Unknown  . Cephalexin Other (See Comments)    Unknown  . Codeine Itching  . Gabapentin Swelling and Other (See Comments)    Throat and leg swelling  . Niaspan [Niacin] Other (See Comments)    Unknown  . Oxybutynin Other (See Comments)    "made me go crazy"-- had crazy dreams.   . Paxil [Paroxetine Hcl] Other (See Comments)    Unknown reaction  . Penicillins Itching and Other (See Comments)    Has patient had a PCN reaction causing immediate rash, facial/tongue/throat swelling, SOB or lightheadedness with hypotension: No Has patient had a PCN reaction causing severe rash involving mucus membranes or skin necrosis: Yes Has patient had a PCN reaction that required hospitalization: No Has patient had a PCN reaction occurring within the last 10 years: No If all of the above answers are "NO", then may proceed with Cephalosporin use.   Marland Kitchen Pentazocine Itching, Nausea Only and Other (See Comments)    Headache.   . Tizanidine Other (See Comments)    "made me crazy"  . Tramadol Other (See Comments)    Head feels "swimmy" and weakness  . Vicodin [Hydrocodone-Acetaminophen] Other (See Comments)    "made me go crazy"   Follow-up Information    Deland Pretty, MD. Call in 1 day(s).   Specialty:  Internal Medicine Why:  Please  call for an appointment. Contact information: [address] [place] [address] [phone]        [facility] Follow up.   Specialty:  Home Health Services Why:  For home health services  Contact information: [address] [place] [address] [phone]            The results of significant diagnostics from this hospitalization (including imaging, microbiology, ancillary and laboratory) are listed below for reference.    Significant Diagnostic Studies: Ct Abdomen Pelvis Wo Contrast  Result Date: 08/14/2018 CLINICAL DATA:  81 year old female with history of nausea, vomiting and abdominal pain. History of lymphoma and colon cancer. EXAM: CT ABDOMEN AND PELVIS WITHOUT CONTRAST TECHNIQUE: Multidetector CT imaging of the abdomen and pelvis was performed following the standard protocol without IV contrast. COMPARISON:  CT the abdomen and pelvis 07/12/2018. PET-CT 08/10/2018. FINDINGS: Lower chest: Calcifications of the aortic valve. Atherosclerotic calcifications in the descending thoracic aorta. Hepatobiliary: No definite cystic or solid hepatic lesions are noted on today's noncontrast CT examination. Status post cholecystectomy. Pancreas: No pancreatic mass or peripancreatic fluid or inflammatory changes are noted on today's noncontrast CT examination. Spleen: Calcified granuloma in the spleen.  Otherwise, unremarkable. Adrenals/Urinary Tract: Unenhanced appearance of kidneys and bilateral adrenal glands is normal. No hydroureteronephrosis. Urinary bladder is normal in appearance. Stomach/Bowel: Normal appearance of the stomach. No pathologic dilatation of small bowel or colon. Very large burden of well-formed stool in the rectum measuring 9.0 x 9.2 x 10.5 cm, strongly suggesting fecal impaction. Numerous colonic diverticulae are noted, without surrounding inflammatory changes to suggest an acute diverticulitis at this time. Postoperative  changes of right hemicolectomy. Vascular/Lymphatic: Aortic atherosclerosis. No lymphadenopathy noted in the abdomen or pelvis. Reproductive: Status post hysterectomy. Ovaries are not confidently identified may be surgically absent or atrophic. Other: No significant volume of ascites.  No pneumoperitoneum. Musculoskeletal: Again noted is a soft tissue mass in the inferior aspect of the right chest wall posteriorly (axial image 17 of series 2 and coronal image 79 of series 5) measuring 3.6 x 5.0 x 8.6 cm, smaller than the prior examination, with some destructive changes in the posterior aspect of the right tenth and eleventh ribs where there are pathologic fractures. There are no other aggressive appearing lytic or blastic lesions noted in the visualized portions of the skeleton. IMPRESSION: 1. Fecal impaction in the distal rectum. 2. No other acute findings are noted elsewhere in the abdomen or pelvis. 3. Colonic diverticulosis without evidence of acute diverticulitis at this time. 4. Interval decreased size of soft tissue mass in the inferior aspect of the right posterior chest wall which currently measures 3.6 x 5.0 x 8.6 cm. 5. Aortic atherosclerosis. 6. There are calcifications of the aortic valve. Echocardiographic correlation for evaluation of potential valvular dysfunction may be warranted if clinically indicated. 7. Additional incidental findings, as above. Electronically Signed   By: Vinnie Langton M.D.   On: 08/14/2018 20:21   Dg Chest 2 View  Result Date: 08/14/2018 CLINICAL DATA:  She has lymphoma and is being currently being treated for it. Generalized pain started last fall but 2 days it got worse to the point that she is unable to eat but no dental pain. States she has fallen multiple times. EXAM: CHEST - 2 VIEW COMPARISON:  Chest x-ray on 07/12/2018 FINDINGS: The patient has a RIGHT-sided PICC line, tip overlying the level of the superior vena cava. Shallow lung inflation accentuates heart size.  The lungs are clear. No pulmonary edema. Degenerative changes are seen in thoracic spine. IMPRESSION: No active cardiopulmonary disease. Electronically Signed   By: Nolon Nations M.D.   On: 08/14/2018 12:28   Dg Thoracic Spine 4v  Result Date: 08/14/2018 CLINICAL DATA:  She has lymphoma and is being currently being treated for it. Generalized pain started last fall but 2 days it got worse to the point that she is unable to eat but no dental pain. States she has fallen multiple times. EXAM: THORACIC SPINE - 4+ VIEW COMPARISON:  None. FINDINGS: There  are mild degenerative changes throughout the thoracic spine. No acute fracture or traumatic subluxation. No suspicious lytic or blastic lesions are identified. RIGHT-sided PICC line tip overlies the superior vena cava. Surgical clips are in the RIGHT UPPER QUADRANT. IMPRESSION: No evidence for acute  abnormality. Electronically Signed   By: Nolon Nations M.D.   On: 08/14/2018 12:27   Dg Lumbar Spine Complete  Result Date: 08/14/2018 CLINICAL DATA:  Low back pain. Status post fall 2 days ago. Initial encounter. EXAM: LUMBAR SPINE - COMPLETE 4+ VIEW COMPARISON:  Plain films lumbar spine 05/25/2018. FINDINGS: No fracture is identified. Convex right scoliosis and severe multilevel degenerative disease are stable in appearance. Large stool ball is noted in the rectum. IMPRESSION: No acute abnormality. Convex right scoliosis and advanced multilevel degenerative disease. Large stool ball in the rectum compatible with fecal impaction. Electronically Signed   By: Inge Rise M.D.   On: 08/14/2018 12:28   Ct Head Wo Contrast  Result Date: 08/14/2018 CLINICAL DATA:  Multiple falls last week. EXAM: CT HEAD WITHOUT CONTRAST CT CERVICAL SPINE WITHOUT CONTRAST TECHNIQUE: Multidetector CT imaging of the head and cervical spine was performed following the standard protocol without intravenous contrast. Multiplanar CT image reconstructions of the cervical spine were  also generated. COMPARISON:  CT head dated August 08, 2015. CT cervical spine dated March 10, 2009. FINDINGS: CT HEAD FINDINGS Brain: No evidence of acute infarction, hemorrhage, hydrocephalus, extra-axial collection or mass lesion/mass effect. Stable mild atrophy and chronic microvascular ischemic changes. Vascular: Atherosclerotic vascular calcification of the carotid siphons. No hyperdense vessel. Skull: Negative for fracture. Unchanged right parietal bone osteoma. Sinuses/Orbits: No acute finding. Prior right mastoidectomy. Chronic opacification of the right sphenoid sinus. Other: None. CT CERVICAL SPINE FINDINGS Alignment: 3 mm anterolisthesis at C7-T1 due to facet arthropathy. No traumatic malalignment. Skull base and vertebrae: No acute fracture. No primary bone lesion or focal pathologic process. Soft tissues and spinal canal: No prevertebral fluid or swelling. No visible canal hematoma. Disc levels: Moderate multilevel disc height loss and facet uncovertebral hypertrophy throughout the cervical spine Upper chest: Negative. Other: None. IMPRESSION: 1. No acute intracranial abnormality. Stable mild atrophy and chronic microvascular ischemic changes. 2. No acute cervical spine fracture. Moderate cervical spondylosis, progressed since 2010. Electronically Signed   By: Titus Dubin M.D.   On: 08/14/2018 12:38   Ct Cervical Spine Wo Contrast  Result Date: 08/14/2018 CLINICAL DATA:  Multiple falls last week. EXAM: CT HEAD WITHOUT CONTRAST CT CERVICAL SPINE WITHOUT CONTRAST TECHNIQUE: Multidetector CT imaging of the head and cervical spine was performed following the standard protocol without intravenous contrast. Multiplanar CT image reconstructions of the cervical spine were also generated. COMPARISON:  CT head dated August 08, 2015. CT cervical spine dated March 10, 2009. FINDINGS: CT HEAD FINDINGS Brain: No evidence of acute infarction, hemorrhage, hydrocephalus, extra-axial collection or mass lesion/mass  effect. Stable mild atrophy and chronic microvascular ischemic changes. Vascular: Atherosclerotic vascular calcification of the carotid siphons. No hyperdense vessel. Skull: Negative for fracture. Unchanged right parietal bone osteoma. Sinuses/Orbits: No acute finding. Prior right mastoidectomy. Chronic opacification of the right sphenoid sinus. Other: None. CT CERVICAL SPINE FINDINGS Alignment: 3 mm anterolisthesis at C7-T1 due to facet arthropathy. No traumatic malalignment. Skull base and vertebrae: No acute fracture. No primary bone lesion or focal pathologic process. Soft tissues and spinal canal: No prevertebral fluid or swelling. No visible canal hematoma. Disc levels: Moderate multilevel disc height loss and facet uncovertebral hypertrophy throughout the cervical spine Upper chest: Negative.  Other: None. IMPRESSION: 1. No acute intracranial abnormality. Stable mild atrophy and chronic microvascular ischemic changes. 2. No acute cervical spine fracture. Moderate cervical spondylosis, progressed since 2010. Electronically Signed   By: Titus Dubin M.D.   On: 08/14/2018 12:38   Nm Pet Image Initial (pi) Skull Base To Thigh  Result Date: 08/11/2018 CLINICAL DATA:  Initial treatment strategy for diffuse large B-cell lymphoma. EXAM: NUCLEAR MEDICINE PET SKULL BASE TO THIGH TECHNIQUE: 7.3 mCi F-18 FDG was injected intravenously. Full-ring PET imaging was performed from the skull base to thigh after the radiotracer. CT data was obtained and used for attenuation correction and anatomic localization. Fasting blood glucose: 115 mg/dl COMPARISON:  CT 07/12/2018 FINDINGS: Mediastinal blood pool activity: SUV max 3.1 NECK: No hypermetabolic lymph nodes in the neck. Incidental CT findings: none CHEST: No hypermetabolic mediastinal or hilar nodes. No suspicious pulmonary nodules on the CT scan. Short segment of hypermetabolic activity in the distal esophagus approximately 3 cm above the GE junction. No additional  abnormal activity esophagus. No mass lesion or obstruction identified on the CT portion exam Incidental CT findings: RIGHT PICC line noted. ABDOMEN/PELVIS: No abnormal hypermetabolic activity within the liver, pancreas, adrenal glands, or spleen. No hypermetabolic lymph nodes in the abdomen or pelvis. RIGHT hemicolectomy. Metabolic activity associated with hustral contraction in the transverse colon is favored benign. Incidental CT findings: Post hysterectomy SKELETON: The RIGHT paraspinal mass at the T10 - T12 vertebral body level is decreased significantly in size measuring 3.3 cm in thickness compared to 5.4 cm in thickness on comparison exam. The activity of through this region is low with SUV max equal 4.4 (reference liver activity equal SUV max 3.9). There is metabolic activity associated with the paraspinal musculature superiorly and inferior to the mass lesion extending from the sacrum all the way to the perispinal neck musculature. This is favored physiologic/traumatic/inflammatory (benign). Vertebral body bone destruction noted centrally within the RIGHT paraspinal mass as described previously. Incidental CT findings: none IMPRESSION: 1. Relatively mild metabolic activity ( [place] 3/4) within the RIGHT paraspinal mass which is decreased significantly in size from comparison exam is consistent positive response to first cycle chemotherapy. 2. No evidence of lymphoma elsewhere on the skull base to thigh FDG PET scan. 3. Focal activity within the distal esophagus above the GE junction without mass lesion or obstruction. Differential includes focal esophagitis versus CT occult neoplasm. Consider upper endoscopy. 4. Diffuse right paraspinal musculature metabolic activity superior and inferior to the right paraspinal mass on the RIGHT is favored benign as described above. Electronically Signed   By: Suzy Bouchard M.D.   On: 08/11/2018 09:34   Dg Abd Portable 1v  Result Date: 08/16/2018 CLINICAL DATA:   Constipation EXAM: PORTABLE ABDOMEN - 1 VIEW COMPARISON:  None. FINDINGS: The bowel gas pattern is normal. No radio-opaque calculi or other significant radiographic abnormality are seen. Dextroscoliosis of the thoracolumbar spine. IMPRESSION: Negative. Electronically Signed   By: Kathreen Devoid   On: 08/16/2018 11:28   Ir Picc Placement Right >5 Yrs Inc Img Guide  Result Date: 08/01/2018 INDICATION: Patient with prior history of colon cancer, now with non-Hodgkin's lymphoma. Request received for central venous access for chemotherapy. EXAM: ULTRASOUND AND FLUOROSCOPIC GUIDED RIGHT UPPER EXTREMITY PICC LINE INSERTION MEDICATIONS: 1% lidocaine to skin and subcutaneous tissue ANESTHESIA/SEDATION: None FLUOROSCOPY TIME:  Fluoroscopy Time:  42 seconds (5 mGy). COMPLICATIONS: None immediate. TECHNIQUE: The procedure, risks, benefits, and alternatives were explained to the patient and informed written consent was obtained. A timeout  was performed prior to the initiation of the procedure. The right upper extremity was prepped with chlorhexidine in a sterile fashion, and a sterile drape was applied covering the operative field. Maximum barrier sterile technique with sterile gowns and gloves were used for the procedure. A timeout was performed prior to the initiation of the procedure. Local anesthesia was provided with 1% lidocaine. Under direct ultrasound guidance, the right brachial vein was accessed with a micropuncture kit after the overlying soft tissues were anesthetized with 1% lidocaine. An ultrasound image was saved for documentation purposes. A guidewire was advanced to the level of the superior caval-atrial junction for measurement purposes and the PICC line was cut to length. A peel-away sheath was placed and a 38 cm, 5 [place], single lumen was inserted to level of the superior caval-atrial junction. A post procedure spot fluoroscopic was obtained. The catheter easily aspirated and flushed and was sutured in  place. A dressing was placed. The patient tolerated the procedure well without immediate post procedural complication. FINDINGS: After catheter placement, the tip lies within the superior cavoatrial junction. The catheter aspirates and flushes normally and is ready for immediate use. IMPRESSION: Successful ultrasound and fluoroscopic guided placement of a right brachial vein approach, 38 cm, 5 French, single lumen PICC with tip at the superior caval-atrial junction. The PICC line is ready for immediate use. Read by: Rowe Robert, PA-C Electronically Signed   By: Aletta Edouard M.D.   On: 08/01/2018 14:03    Microbiology: Recent Results (from the past 240 hour(s))  Urine culture     Status: None   Collection Time: 08/14/18  2:35 PM  Result Value Ref Range Status   Specimen Description   Final    URINE, RANDOM Performed at [facility] [address], [place], [place] [address]    Special Requests   Final    NONE Performed at [facility], [place] [address] [place], [place] [address]    Culture   Final    NO GROWTH Performed at [facility], [place] [address], [place], [place] [address]    Report Status 08/15/2018 FINAL  Final  MRSA PCR Screening     Status: None   Collection Time: 08/14/18  8:33 PM  Result Value Ref Range Status   MRSA by PCR NEGATIVE NEGATIVE Final    Comment:        The GeneXpert MRSA Assay (FDA approved for NASAL specimens only), is one component of a comprehensive MRSA colonization surveillance program. It is not intended to diagnose MRSA infection nor to guide or monitor treatment for MRSA infections. Performed at [facility], [place] [address] [place], [place] [address]      Labs: Basic Metabolic Panel: Recent Labs  Lab 08/11/18 1138 08/14/18 1050 08/15/18 0556 08/16/18 0335 08/17/18 0440  NA 137 140 142 145 144  K 3.4* 4.1 3.5 3.3* 4.0  CL 96* 96* 102 108 109  CO2 30 31 31 30 27     GLUCOSE 106* 85 104* 94 90  BUN 41* 33* 24* 19 15  CREATININE 1.25* 1.52* 1.01* 0.90 0.82  CALCIUM 8.8* 8.9 8.0* 8.2* 8.0*  MG  --   --   --  1.9  --    Liver Function Tests: Recent Labs  Lab 08/11/18 1138 08/14/18 1050  AST 7* 16  ALT 9 14  ALKPHOS 70 82  BILITOT 0.8 0.8  PROT 5.5* 5.7*  ALBUMIN 2.4* 2.4*   Recent Labs  Lab 08/14/18 1050  LIPASE 23   No results for input(s): AMMONIA in the last 168 hours. CBC: Recent Labs  Lab 08/11/18 0817 08/12/18 0833 08/14/18 1050 08/15/18 0556 08/16/18 0335 08/17/18 0440  WBC 0.3* 0.9* 12.8* 7.6 6.8 6.9  NEUTROABS 0.1* 0.5* 11.9*  --   --   --   HGB 7.7* 10.2* 10.7* 9.3* 8.9* 9.0*  HCT 22.0* 29.2* 30.8* 27.0* 26.0* 26.2*  MCV 93.2 88.5 89.0 89.7 90.6 90.3  PLT 199 155 231 188 181 197   Cardiac Enzymes: No results for input(s): CKTOTAL, CKMB, CKMBINDEX, TROPONINI in the last 168 hours. BNP: BNP (last 3 results) No results for input(s): BNP in the last 8760 hours.  ProBNP (last 3 results) No results for input(s): PROBNP in the last 8760 hours.  CBG: No results for input(s): GLUCAP in the last 168 hours.     Signed:  Kayleen Memos, MD [facility] 08/17/2018, 3:43 PM

## 2018-08-17 NOTE — Evaluation (Signed)
Physical Therapy Evaluation Patient Details Name: Erica Escobar MRN: [id number] DOB: 06/15/1936 Today's Date: 08/17/2018   History of Present Illness  Pt is an 82 year old female with PMH of B-cell lymphoma, colon cancer S/P colectomy, GERD, COPD, HLD, large soft tissue mass posterior chest well with related bony destruction left 11th rib fracture, pathological fractures T8-9, destruction of right T11 and 12 transverse processes; admitted on 08/14/2018, presented with complaint of fatigue and tiredness and dizziness, was found to have dehydration causing orthostatic hypotension as well as acute kidney injury and severe constipation  Clinical Impression  Pt admitted with above diagnosis. Pt currently with functional limitations due to the deficits listed below (see PT Problem List).  Pt will benefit from skilled PT to increase their independence and safety with mobility to allow discharge to the venue listed below.  Pt assisted to [facility] and then ambulated short distance in hallway which was limited by LEs buckling and dizziness.  Pt reports she has been at [facility] for rehab.  Pt uncertain about d/c plans due to concerns with payments and insurance.  Pt presents as high fall risk.  Recommend SNF upond d/c.  If home, will need increased support.     Follow Up Recommendations SNF    Equipment Recommendations  None recommended by PT    Recommendations for Other Services       Precautions / Restrictions Precautions Precautions: Fall Restrictions Weight Bearing Restrictions: No      Mobility  Bed Mobility Overal bed mobility: Needs Assistance Bed Mobility: Rolling;Sidelying to Sit Rolling: Supervision Sidelying to sit: Supervision       General bed mobility comments: no physical assist required  Transfers Overall transfer level: Needs assistance Equipment used: Rolling walker (2 wheeled) Transfers: Sit to/from Stand Sit to Stand: Min guard         General transfer comment: min/guard  for safety  Ambulation/Gait Ambulation/Gait assistance: Min assist Gait Distance (Feet): 35 Feet Assistive device: Rolling walker (2 wheeled) Gait Pattern/deviations: Step-through pattern;Decreased stride length;Trunk flexed     General Gait Details: verbal cues for RW positioning, pt reports dizziness and observed knee buckling so recliner brougth behind pt to sit down; vitals: 154/85 mmHg, 97 bpm, 98% SPO2 room air  Stairs            Wheelchair Mobility    Modified Rankin (Stroke Patients Only)       Balance Overall balance assessment: History of Falls(pt reports 3 recent falls due to LEs buckling)                                           Pertinent Vitals/Pain Pain Assessment: Faces Faces Pain Scale: Hurts little more Pain Location: back Pain Descriptors / Indicators: Tender;Sore Pain Intervention(s): Limited activity within patient's tolerance;Monitored during session;Repositioned    Home Living Family/patient expects to be discharged to:: Private residence Living Arrangements: Alone   Type of Home: Mobile home Home Access: Ramped entrance     Home Layout: One level Home Equipment: Environmental consultant - 4 wheels;Wheelchair - manual      Prior Function Level of Independence: Independent with assistive device(s)         Comments: prior to previous admission; pt was modified independent using walker, sometimes used w/c if legs did not feel strong     Hand Dominance        Extremity/Trunk Assessment  Upper Extremity Assessment Upper Extremity Assessment: Generalized weakness    Lower Extremity Assessment Lower Extremity Assessment: Generalized weakness       Communication   Communication: No difficulties  Cognition Arousal/Alertness: Awake/alert Behavior During Therapy: WFL for tasks assessed/performed Overall Cognitive Status: Within Functional Limits for tasks assessed                                         General Comments      Exercises     Assessment/Plan    PT Assessment Patient needs continued PT services  PT Problem List Decreased strength;Decreased mobility;Decreased activity tolerance;Decreased balance;Decreased knowledge of use of DME       PT Treatment Interventions DME instruction;Therapeutic activities;Stair training;Functional mobility training;Balance training;Patient/family education;Therapeutic exercise;Gait training    PT Goals (Current goals can be found in the Care Plan section)  Acute Rehab PT Goals PT Goal Formulation: With patient Time For Goal Achievement: 08/31/18 Potential to Achieve Goals: Good    Frequency Min 2X/week   Barriers to discharge        Co-evaluation               AM-PAC PT "6 Clicks" Daily Activity  Outcome Measure Difficulty turning over in bed (including adjusting bedclothes, sheets and blankets)?: None Difficulty moving from lying on back to sitting on the side of the bed? : A Little Difficulty sitting down on and standing up from a chair with arms (e.g., wheelchair, bedside commode, etc,.)?: Unable Help needed moving to and from a bed to chair (including a wheelchair)?: A Little Help needed walking in hospital room?: A Little Help needed climbing 3-5 steps with a railing? : A Lot 6 Click Score: 16    End of Session Equipment Utilized During Treatment: Gait belt Activity Tolerance: Patient limited by fatigue Patient left: in chair;with chair alarm set;with call bell/phone within reach Nurse Communication: Mobility status PT Visit Diagnosis: Other abnormalities of gait and mobility (R26.89);History of falling (Z91.81)    Time: 4451-4604 PT Time Calculation (min) (ACUTE ONLY): 16 min   Charges:   PT Evaluation $PT Eval Low Complexity: 1 Low          Carmelia Bake, PT, DPT 08/17/2018 Pager: [phone]  York Ram E 08/17/2018, 12:36 PM

## 2018-08-17 NOTE — Care Management Important Message (Signed)
Important Message  Patient Details  Name: REYANN TROOP MRN: [id number] Date of Birth: Jun 22, 1936   Medicare Important Message Given:  Yes    [name] 08/17/2018, 11:59 AMImportant Message  Patient Details  Name: DOROTHYE BERNI MRN: [id number] Date of Birth: 1936/09/23   Medicare Important Message Given:  Yes    [name] 08/17/2018, 11:59 AM

## 2018-08-17 NOTE — Care Management (Signed)
Per Pt she can not afford to go to SNF and wants home health services. Choice offered and [facility] chosen. [facility] rep alerted of referral. Pt states she has RW and 3in1 at home and her son is picking her up. Marney Doctor RN,BSN [phone]

## 2018-08-18 ENCOUNTER — Ambulatory Visit: Payer: Medicare Other | Admitting: Nurse Practitioner

## 2018-08-18 ENCOUNTER — Other Ambulatory Visit: Payer: Medicare Other

## 2018-08-18 ENCOUNTER — Other Ambulatory Visit: Payer: Self-pay

## 2018-08-18 ENCOUNTER — Other Ambulatory Visit: Payer: Self-pay | Admitting: Medical

## 2018-08-18 ENCOUNTER — Inpatient Hospital Stay: Payer: Medicare Other | Attending: Nurse Practitioner | Admitting: Medical

## 2018-08-18 ENCOUNTER — Ambulatory Visit
Admission: RE | Admit: 2018-08-18 | Discharge: 2018-08-18 | Disposition: A | Discharge status: Home / Self Care | Payer: Medicare Other | Source: Ambulatory Visit | Attending: Radiation Oncology | Admitting: Radiation Oncology

## 2018-08-18 ENCOUNTER — Inpatient Hospital Stay: Payer: Medicare Other | Admitting: Medical

## 2018-08-18 VITALS — BP 127/78 | HR 98 | Resp 20

## 2018-08-18 VITALS — BP 157/82 | HR 84 | Temp 98.2°F | Resp 18

## 2018-08-18 DIAGNOSIS — Z85828 Personal history of other malignant neoplasm of skin: Secondary | ICD-10-CM | POA: Insufficient documentation

## 2018-08-18 DIAGNOSIS — N183 Chronic kidney disease, stage 3 unspecified: Secondary | ICD-10-CM

## 2018-08-18 DIAGNOSIS — C8580 Other specified types of non-Hodgkin lymphoma, unspecified site: Secondary | ICD-10-CM

## 2018-08-18 DIAGNOSIS — C8339 Diffuse large B-cell lymphoma, extranodal and solid organ sites: Secondary | ICD-10-CM | POA: Insufficient documentation

## 2018-08-18 DIAGNOSIS — Z7689 Persons encountering health services in other specified circumstances: Secondary | ICD-10-CM | POA: Diagnosis not present

## 2018-08-18 DIAGNOSIS — Z9049 Acquired absence of other specified parts of digestive tract: Secondary | ICD-10-CM | POA: Diagnosis not present

## 2018-08-18 DIAGNOSIS — F329 Major depressive disorder, single episode, unspecified: Secondary | ICD-10-CM | POA: Diagnosis not present

## 2018-08-18 DIAGNOSIS — E860 Dehydration: Secondary | ICD-10-CM | POA: Diagnosis not present

## 2018-08-18 DIAGNOSIS — I129 Hypertensive chronic kidney disease with stage 1 through stage 4 chronic kidney disease, or unspecified chronic kidney disease: Secondary | ICD-10-CM | POA: Insufficient documentation

## 2018-08-18 DIAGNOSIS — Z881 Allergy status to other antibiotic agents status: Secondary | ICD-10-CM | POA: Diagnosis not present

## 2018-08-18 DIAGNOSIS — Z7982 Long term (current) use of aspirin: Secondary | ICD-10-CM | POA: Insufficient documentation

## 2018-08-18 DIAGNOSIS — Z87891 Personal history of nicotine dependence: Secondary | ICD-10-CM | POA: Insufficient documentation

## 2018-08-18 DIAGNOSIS — Z90710 Acquired absence of both cervix and uterus: Secondary | ICD-10-CM | POA: Diagnosis not present

## 2018-08-18 DIAGNOSIS — K219 Gastro-esophageal reflux disease without esophagitis: Secondary | ICD-10-CM | POA: Insufficient documentation

## 2018-08-18 DIAGNOSIS — Z888 Allergy status to other drugs, medicaments and biological substances status: Secondary | ICD-10-CM | POA: Insufficient documentation

## 2018-08-18 DIAGNOSIS — Z23 Encounter for immunization: Secondary | ICD-10-CM | POA: Insufficient documentation

## 2018-08-18 DIAGNOSIS — G2581 Restless legs syndrome: Secondary | ICD-10-CM | POA: Diagnosis not present

## 2018-08-18 DIAGNOSIS — Z885 Allergy status to narcotic agent status: Secondary | ICD-10-CM | POA: Diagnosis not present

## 2018-08-18 DIAGNOSIS — R627 Adult failure to thrive: Secondary | ICD-10-CM | POA: Insufficient documentation

## 2018-08-18 DIAGNOSIS — Z79899 Other long term (current) drug therapy: Secondary | ICD-10-CM | POA: Insufficient documentation

## 2018-08-18 DIAGNOSIS — Z5112 Encounter for antineoplastic immunotherapy: Secondary | ICD-10-CM | POA: Diagnosis not present

## 2018-08-18 DIAGNOSIS — Z880 Allergy status to penicillin: Secondary | ICD-10-CM | POA: Diagnosis not present

## 2018-08-18 DIAGNOSIS — N189 Chronic kidney disease, unspecified: Secondary | ICD-10-CM | POA: Diagnosis not present

## 2018-08-18 DIAGNOSIS — G4700 Insomnia, unspecified: Secondary | ICD-10-CM | POA: Insufficient documentation

## 2018-08-18 DIAGNOSIS — E785 Hyperlipidemia, unspecified: Secondary | ICD-10-CM | POA: Insufficient documentation

## 2018-08-18 DIAGNOSIS — C8330 Diffuse large B-cell lymphoma, unspecified site: Secondary | ICD-10-CM | POA: Diagnosis not present

## 2018-08-18 DIAGNOSIS — I700 Atherosclerosis of aorta: Secondary | ICD-10-CM | POA: Insufficient documentation

## 2018-08-18 DIAGNOSIS — R110 Nausea: Secondary | ICD-10-CM

## 2018-08-18 DIAGNOSIS — H409 Unspecified glaucoma: Secondary | ICD-10-CM | POA: Insufficient documentation

## 2018-08-18 DIAGNOSIS — C799 Secondary malignant neoplasm of unspecified site: Secondary | ICD-10-CM

## 2018-08-18 DIAGNOSIS — Z85038 Personal history of other malignant neoplasm of large intestine: Secondary | ICD-10-CM | POA: Insufficient documentation

## 2018-08-18 DIAGNOSIS — R5383 Other fatigue: Secondary | ICD-10-CM

## 2018-08-18 DIAGNOSIS — J449 Chronic obstructive pulmonary disease, unspecified: Secondary | ICD-10-CM | POA: Diagnosis not present

## 2018-08-18 LAB — CBC WITH DIFFERENTIAL (CANCER CENTER ONLY)
Basophils Absolute: 0 10*3/uL (ref 0.0–0.1)
Basophils Relative: 0 %
EOS ABS: 0 10*3/uL (ref 0.0–0.5)
Eosinophils Relative: 0 %
HEMATOCRIT: 27.8 % — AB (ref 34.8–46.6)
HEMOGLOBIN: 9.4 g/dL — AB (ref 11.6–15.9)
LYMPHS ABS: 1 10*3/uL (ref 0.9–3.3)
Lymphocytes Relative: 13 %
MCH: 30.5 pg (ref 25.1–34.0)
MCHC: 33.8 g/dL (ref 31.5–36.0)
MCV: 90.3 fL (ref 79.5–101.0)
MONOS PCT: 8 %
Monocytes Absolute: 0.6 10*3/uL (ref 0.1–0.9)
NEUTROS PCT: 79 %
Neutro Abs: 6 10*3/uL (ref 1.5–6.5)
Platelet Count: 187 10*3/uL (ref 145–400)
RBC: 3.08 MIL/uL — AB (ref 3.70–5.45)
RDW: 14.9 % — ABNORMAL HIGH (ref 11.2–14.5)
WBC: 7.6 10*3/uL (ref 3.9–10.3)

## 2018-08-18 LAB — LACTATE DEHYDROGENASE: LDH: 262 U/L — AB (ref 98–192)

## 2018-08-18 LAB — CMP (CANCER CENTER ONLY)
ALBUMIN: 2.3 g/dL — AB (ref 3.5–5.0)
ALK PHOS: 75 U/L (ref 38–126)
ALT: 13 U/L (ref 0–44)
AST: 12 U/L — ABNORMAL LOW (ref 15–41)
Anion gap: 8 (ref 5–15)
BILIRUBIN TOTAL: 0.7 mg/dL (ref 0.3–1.2)
BUN: 14 mg/dL (ref 8–23)
CALCIUM: 8.3 mg/dL — AB (ref 8.9–10.3)
CO2: 27 mmol/L (ref 22–32)
CREATININE: 0.78 mg/dL (ref 0.44–1.00)
Chloride: 106 mmol/L (ref 98–111)
GFR, Est AFR Am: >60 mL/min (ref >60)
GFR, Estimated: >60 mL/min (ref >60)
GLUCOSE: 77 mg/dL (ref 70–99)
Potassium: 3.7 mmol/L (ref 3.5–5.1)
SODIUM: 141 mmol/L (ref 135–145)
Total Protein: 5.1 g/dL — ABNORMAL LOW (ref 6.5–8.1)

## 2018-08-18 MED ORDER — HEPARIN SOD (PORK) LOCK FLUSH 100 UNIT/ML IV SOLN
250.0000 [IU] | Freq: Once | INTRAVENOUS | Status: AC
  Administered 2018-08-18: 250 [IU]
  Filled 2018-08-18: qty 5

## 2018-08-18 MED ORDER — PROMETHAZINE HCL 25 MG/ML IJ SOLN
12.5000 mg | Freq: Once | INTRAMUSCULAR | Status: AC
  Administered 2018-08-18: 12.5 mg via INTRAVENOUS

## 2018-08-18 MED ORDER — PROMETHAZINE HCL 25 MG PO TABS: 12.5000 mg | ORAL_TABLET | Freq: Three times a day (TID) | ORAL | 1 refills | Status: DC | PRN

## 2018-08-18 MED ORDER — SODIUM CHLORIDE 0.9 % IV SOLN
Freq: Once | INTRAVENOUS | Status: AC
  Administered 2018-08-18: 15:00:00 via INTRAVENOUS
  Filled 2018-08-18: qty 250

## 2018-08-18 MED ORDER — SODIUM CHLORIDE 0.9% FLUSH
10.0000 mL | Freq: Once | INTRAVENOUS | Status: AC
  Administered 2018-08-18: 10 mL
  Filled 2018-08-18: qty 10

## 2018-08-18 MED ORDER — PROMETHAZINE HCL 25 MG/ML IJ SOLN
INTRAMUSCULAR | Status: AC
  Filled 2018-08-18: qty 1

## 2018-08-18 NOTE — Progress Notes (Signed)
These preliminary result these preliminary results were noted.  Awaiting final report.

## 2018-08-18 NOTE — Progress Notes (Signed)
Pt here for a follow-up appointment today adenocarcinoma of the ascending colon. Pt states that she has pain all over her body. Pt states that she has a burning midline chest and her abdomen. Pt states that she feels nauseous. Pt states that she has fatigue. Pt denies having any vomiting. Pt states that she had constipation. Pt states she was impacted and was treated in the hospital. Pt denies having any dysuria or hematuria.  Pt states that she does not have a good appetite.  BP 127/78 (BP Location: Left Arm, Patient Position: Sitting)   Pulse 98   Resp 20   SpO2 99%    Wt Readings from Last 3 Encounters:  08/14/18 175 lb (79.4 kg)  08/12/18 146 lb (66.2 kg)  08/10/18 146 lb (66.2 kg)

## 2018-08-18 NOTE — Progress Notes (Signed)
Radiation Oncology         [phone] ________________________________  Name: Erica Escobar MRN: [id number]  Date: 08/18/2018  DOB: 03-17-1936  Follow-Up Visit Note  CC: Deland Pretty, MD  Deland Pretty, MD    ICD-10-CM   1. Metastatic cancer ([place]) C79.9     Diagnosis:   Diffuse Large B-Cell Lymphoma  Interval Since Last Radiation:  1 month 07/14/18 - 07/22/18: Abdomen/ received 21 Gy in 7 fractions out of the prescribed 30 Gy in 10 fractions  Narrative:  The patient returns today for routine follow-up. She is accompanied by her son. She was originally diagnosed with colon cancer but was found to have Non-Hodgkin's lymphoma. Recent PET scan performed on 08/10/18 revealed: 1. Relatively mild metabolic activity ( [place]au[place] 3/4) within the RIGHT paraspinal mass which is decreased significantly in size from comparison exam is consistent positive response to first cycle chemotherapy. 2. No evidence of lymphoma elsewhere on the skull base to thigh FDG PET scan. 3. Focal activity within the distal esophagus above the GE junction without mass lesion or obstruction. Differential includes focal esophagitis versus CT occult neoplasm. Consider upper endoscopy. 4. Diffuse right paraspinal musculature metabolic activity superior and inferior to the right paraspinal mass on the RIGHT is favored benign as described above.  He reports that she has bed sores and a hernia. He notes she began feeling worse upon arrival and being placed in a wheelchair. She reports pain all over her body, burning to midline chest and abdomen, nausea, fatigue, constipation, and poor appetite. She denies vomiting and dysuria or hematuria.       ALLERGIES:  is allergic to azithromycin; allopurinol; cephalexin; codeine; gabapentin; niaspan [niacin]; oxybutynin; paxil [paroxetine hcl]; penicillins; pentazocine; tizanidine; tramadol; and vicodin [hydrocodone-acetaminophen].  Meds: Current Outpatient Medications  Medication Sig  Dispense Refill  . aspirin EC 81 MG tablet Take 81 mg by mouth every morning.    Marland Kitchen ENSURE (ENSURE) Take 237 mLs by mouth daily. VANILLA OR STRAWBERRY IF AVAILABLE PO DAILY     . gemfibrozil (LOPID) 600 MG tablet Take 600 mg by mouth every morning.    Marland Kitchen levofloxacin (LEVAQUIN) 500 MG tablet Take 1 tablet (500 mg total) by mouth daily. 7 tablet 0  . loratadine (CLARITIN) 10 MG tablet Take 10 mg by mouth daily as needed for allergies.    . mirtazapine (REMERON) 15 MG tablet Take 7.5 mg by mouth at bedtime.    . multivitamin-lutein (OCUVITE-LUTEIN) CAPS capsule Take 1 capsule by mouth daily.    . Omega-3 Fatty Acids (FISH OIL) 1000 MG CAPS Take 2,000 mg by mouth daily with breakfast.     . oxyCODONE-acetaminophen (PERCOCET) 5-325 MG tablet Take 1 tablet by mouth See admin instructions. Take 1 tablet by mouth every 6 hours scheduled. Take 1 tablet by mouth every 4 hours as needed for breakthrough pain.    . pantoprazole (PROTONIX) 40 MG tablet Take 40 mg by mouth daily.    . polyethylene glycol (MIRALAX / GLYCOLAX) packet Take 17 g by mouth daily as needed for moderate constipation.    . Polyvinyl Alcohol-Povidone PF (REFRESH) 1.4-0.6 % SOLN Place 6 drops into both eyes daily.    . potassium chloride (K-DUR) 10 MEQ tablet Take 10 mEq by mouth daily.     Marland Kitchen trolamine salicylate (ASPERCREME) 10 % cream Apply 1 application topically as needed for muscle pain (as needed for back pain).    Marland Kitchen ULORIC 40 MG tablet Take 40 mg by mouth daily.     Marland Kitchen  vitamin B-12 (CYANOCOBALAMIN) 1000 MCG tablet Take 1,000 mcg by mouth daily.    Marland Kitchen dexamethasone (DECADRON) 4 MG tablet Take 1 tablet (4 mg total) by mouth daily. (Patient not taking: Reported on 08/18/2018) 30 tablet 0  . meloxicam (MOBIC) 15 MG tablet Take 15 mg by mouth daily.     . promethazine (PHENERGAN) 25 MG tablet Take 0.5 tablets (12.5 mg total) by mouth every 8 (eight) hours as needed for nausea or vomiting. 30 tablet 1   No current facility-administered  medications for this encounter.    Facility-Administered Medications Ordered in Other Encounters  Medication Dose Route Frequency Provider Last Rate Last Dose  . 0.9 %  sodium chloride infusion   Intravenous Once Harle Stanford., PA-C 500 mL/hr at 08/18/18 1518       Physical Findings: The patient is in no acute distress.   blood pressure is 127/78 and her pulse is 98. Her respiration is 20 and oxygen saturation is 99%.   Lungs are clear to auscultation bilaterally. Heart has regular rate and rhythm. No palpable cervical, supraclavicular, or axillary adenopathy. Abdomen mildly tender, bowel sounds present but decreased, no rebound or guarding. Patient is quite lethargic and complaining of significant pain in her back. She was placed on a stretcher.  Lab Findings: Lab Results  Component Value Date   WBC 7.6 08/18/2018   HGB 9.4 (L) 08/18/2018   HCT 27.8 (L) 08/18/2018   MCV 90.3 08/18/2018   PLT 187 08/18/2018    Radiographic Findings: Ct Abdomen Pelvis Wo Contrast  Result Date: 08/14/2018 CLINICAL DATA:  82 year old female with history of nausea, vomiting and abdominal pain. History of lymphoma and colon cancer. EXAM: CT ABDOMEN AND PELVIS WITHOUT CONTRAST TECHNIQUE: Multidetector CT imaging of the abdomen and pelvis was performed following the standard protocol without IV contrast. COMPARISON:  CT the abdomen and pelvis 07/12/2018. PET-CT 08/10/2018. FINDINGS: Lower chest: Calcifications of the aortic valve. Atherosclerotic calcifications in the descending thoracic aorta. Hepatobiliary: No definite cystic or solid hepatic lesions are noted on today's noncontrast CT examination. Status post cholecystectomy. Pancreas: No pancreatic mass or peripancreatic fluid or inflammatory changes are noted on today's noncontrast CT examination. Spleen: Calcified granuloma in the spleen.  Otherwise, unremarkable. Adrenals/Urinary Tract: Unenhanced appearance of kidneys and bilateral adrenal glands is  normal. No hydroureteronephrosis. Urinary bladder is normal in appearance. Stomach/Bowel: Normal appearance of the stomach. No pathologic dilatation of small bowel or colon. Very large burden of well-formed stool in the rectum measuring 9.0 x 9.2 x 10.5 cm, strongly suggesting fecal impaction. Numerous colonic diverticulae are noted, without surrounding inflammatory changes to suggest an acute diverticulitis at this time. Postoperative changes of right hemicolectomy. Vascular/Lymphatic: Aortic atherosclerosis. No lymphadenopathy noted in the abdomen or pelvis. Reproductive: Status post hysterectomy. Ovaries are not confidently identified may be surgically absent or atrophic. Other: No significant volume of ascites.  No pneumoperitoneum. Musculoskeletal: Again noted is a soft tissue mass in the inferior aspect of the right chest wall posteriorly (axial image 17 of series 2 and coronal image 79 of series 5) measuring 3.6 x 5.0 x 8.6 cm, smaller than the prior examination, with some destructive changes in the posterior aspect of the right tenth and eleventh ribs where there are pathologic fractures. There are no other aggressive appearing lytic or blastic lesions noted in the visualized portions of the skeleton. IMPRESSION: 1. Fecal impaction in the distal rectum. 2. No other acute findings are noted elsewhere in the abdomen or pelvis. 3. Colonic diverticulosis without  evidence of acute diverticulitis at this time. 4. Interval decreased size of soft tissue mass in the inferior aspect of the right posterior chest wall which currently measures 3.6 x 5.0 x 8.6 cm. 5. Aortic atherosclerosis. 6. There are calcifications of the aortic valve. Echocardiographic correlation for evaluation of potential valvular dysfunction may be warranted if clinically indicated. 7. Additional incidental findings, as above. Electronically Signed   By: Vinnie Langton M.D.   On: 08/14/2018 20:21   Dg Chest 2 View  Result Date:  08/14/2018 CLINICAL DATA:  She has lymphoma and is being currently being treated for it. Generalized pain started last fall but 2 days it got worse to the point that she is unable to eat but no dental pain. States she has fallen multiple times. EXAM: CHEST - 2 VIEW COMPARISON:  Chest x-ray on 07/12/2018 FINDINGS: The patient has a RIGHT-sided PICC line, tip overlying the level of the superior vena cava. Shallow lung inflation accentuates heart size. The lungs are clear. No pulmonary edema. Degenerative changes are seen in thoracic spine. IMPRESSION: No active cardiopulmonary disease. Electronically Signed   By: Nolon Nations M.D.   On: 08/14/2018 12:28   Dg Thoracic Spine 4v  Result Date: 08/14/2018 CLINICAL DATA:  She has lymphoma and is being currently being treated for it. Generalized pain started last fall but 2 days it got worse to the point that she is unable to eat but no dental pain. States she has fallen multiple times. EXAM: THORACIC SPINE - 4+ VIEW COMPARISON:  None. FINDINGS: There are mild degenerative changes throughout the thoracic spine. No acute fracture or traumatic subluxation. No suspicious lytic or blastic lesions are identified. RIGHT-sided PICC line tip overlies the superior vena cava. Surgical clips are in the RIGHT UPPER QUADRANT. IMPRESSION: No evidence for acute  abnormality. Electronically Signed   By: Nolon Nations M.D.   On: 08/14/2018 12:27   Dg Lumbar Spine Complete  Result Date: 08/14/2018 CLINICAL DATA:  Low back pain. Status post fall 2 days ago. Initial encounter. EXAM: LUMBAR SPINE - COMPLETE 4+ VIEW COMPARISON:  Plain films lumbar spine 05/25/2018. FINDINGS: No fracture is identified. Convex right scoliosis and severe multilevel degenerative disease are stable in appearance. Large stool ball is noted in the rectum. IMPRESSION: No acute abnormality. Convex right scoliosis and advanced multilevel degenerative disease. Large stool ball in the rectum compatible with fecal  impaction. Electronically Signed   By: Inge Rise M.D.   On: 08/14/2018 12:28   Ct Head Wo Contrast  Result Date: 08/14/2018 CLINICAL DATA:  Multiple falls last week. EXAM: CT HEAD WITHOUT CONTRAST CT CERVICAL SPINE WITHOUT CONTRAST TECHNIQUE: Multidetector CT imaging of the head and cervical spine was performed following the standard protocol without intravenous contrast. Multiplanar CT image reconstructions of the cervical spine were also generated. COMPARISON:  CT head dated August 08, 2015. CT cervical spine dated March 10, 2009. FINDINGS: CT HEAD FINDINGS Brain: No evidence of acute infarction, hemorrhage, hydrocephalus, extra-axial collection or mass lesion/mass effect. Stable mild atrophy and chronic microvascular ischemic changes. Vascular: Atherosclerotic vascular calcification of the carotid siphons. No hyperdense vessel. Skull: Negative for fracture. Unchanged right parietal bone osteoma. Sinuses/Orbits: No acute finding. Prior right mastoidectomy. Chronic opacification of the right sphenoid sinus. Other: None. CT CERVICAL SPINE FINDINGS Alignment: 3 mm anterolisthesis at C7-T1 due to facet arthropathy. No traumatic malalignment. Skull base and vertebrae: No acute fracture. No primary bone lesion or focal pathologic process. Soft tissues and spinal canal: No prevertebral fluid  or swelling. No visible canal hematoma. Disc levels: Moderate multilevel disc height loss and facet uncovertebral hypertrophy throughout the cervical spine Upper chest: Negative. Other: None. IMPRESSION: 1. No acute intracranial abnormality. Stable mild atrophy and chronic microvascular ischemic changes. 2. No acute cervical spine fracture. Moderate cervical spondylosis, progressed since 2010. Electronically Signed   By: Titus Dubin M.D.   On: 08/14/2018 12:38   Ct Cervical Spine Wo Contrast  Result Date: 08/14/2018 CLINICAL DATA:  Multiple falls last week. EXAM: CT HEAD WITHOUT CONTRAST CT CERVICAL SPINE WITHOUT  CONTRAST TECHNIQUE: Multidetector CT imaging of the head and cervical spine was performed following the standard protocol without intravenous contrast. Multiplanar CT image reconstructions of the cervical spine were also generated. COMPARISON:  CT head dated August 08, 2015. CT cervical spine dated March 10, 2009. FINDINGS: CT HEAD FINDINGS Brain: No evidence of acute infarction, hemorrhage, hydrocephalus, extra-axial collection or mass lesion/mass effect. Stable mild atrophy and chronic microvascular ischemic changes. Vascular: Atherosclerotic vascular calcification of the carotid siphons. No hyperdense vessel. Skull: Negative for fracture. Unchanged right parietal bone osteoma. Sinuses/Orbits: No acute finding. Prior right mastoidectomy. Chronic opacification of the right sphenoid sinus. Other: None. CT CERVICAL SPINE FINDINGS Alignment: 3 mm anterolisthesis at C7-T1 due to facet arthropathy. No traumatic malalignment. Skull base and vertebrae: No acute fracture. No primary bone lesion or focal pathologic process. Soft tissues and spinal canal: No prevertebral fluid or swelling. No visible canal hematoma. Disc levels: Moderate multilevel disc height loss and facet uncovertebral hypertrophy throughout the cervical spine Upper chest: Negative. Other: None. IMPRESSION: 1. No acute intracranial abnormality. Stable mild atrophy and chronic microvascular ischemic changes. 2. No acute cervical spine fracture. Moderate cervical spondylosis, progressed since 2010. Electronically Signed   By: Titus Dubin M.D.   On: 08/14/2018 12:38   Nm Pet Image Initial (pi) Skull Base To Thigh  Result Date: 08/11/2018 CLINICAL DATA:  Initial treatment strategy for diffuse large B-cell lymphoma. EXAM: NUCLEAR MEDICINE PET SKULL BASE TO THIGH TECHNIQUE: 7.3 mCi F-18 FDG was injected intravenously. Full-ring PET imaging was performed from the skull base to thigh after the radiotracer. CT data was obtained and used for attenuation  correction and anatomic localization. Fasting blood glucose: 115 mg/dl COMPARISON:  CT 07/12/2018 FINDINGS: Mediastinal blood pool activity: SUV max 3.1 NECK: No hypermetabolic lymph nodes in the neck. Incidental CT findings: none CHEST: No hypermetabolic mediastinal or hilar nodes. No suspicious pulmonary nodules on the CT scan. Short segment of hypermetabolic activity in the distal esophagus approximately 3 cm above the GE junction. No additional abnormal activity esophagus. No mass lesion or obstruction identified on the CT portion exam Incidental CT findings: RIGHT PICC line noted. ABDOMEN/PELVIS: No abnormal hypermetabolic activity within the liver, pancreas, adrenal glands, or spleen. No hypermetabolic lymph nodes in the abdomen or pelvis. RIGHT hemicolectomy. Metabolic activity associated with hustral contraction in the transverse colon is favored benign. Incidental CT findings: Post hysterectomy SKELETON: The RIGHT paraspinal mass at the T10 - T12 vertebral body level is decreased significantly in size measuring 3.3 cm in thickness compared to 5.4 cm in thickness on comparison exam. The activity of through this region is low with SUV max equal 4.4 (reference liver activity equal SUV max 3.9). There is metabolic activity associated with the paraspinal musculature superiorly and inferior to the mass lesion extending from the sacrum all the way to the perispinal neck musculature. This is favored physiologic/traumatic/inflammatory (benign). Vertebral body bone destruction noted centrally within the RIGHT paraspinal mass as described  previously. Incidental CT findings: none IMPRESSION: 1. Relatively mild metabolic activity ( [place] 3/4) within the RIGHT paraspinal mass which is decreased significantly in size from comparison exam is consistent positive response to first cycle chemotherapy. 2. No evidence of lymphoma elsewhere on the skull base to thigh FDG PET scan. 3. Focal activity within the distal  esophagus above the GE junction without mass lesion or obstruction. Differential includes focal esophagitis versus CT occult neoplasm. Consider upper endoscopy. 4. Diffuse right paraspinal musculature metabolic activity superior and inferior to the right paraspinal mass on the RIGHT is favored benign as described above. Electronically Signed   By: Suzy Bouchard M.D.   On: 08/11/2018 09:34   Dg Abd Portable 1v  Result Date: 08/16/2018 CLINICAL DATA:  Constipation EXAM: PORTABLE ABDOMEN - 1 VIEW COMPARISON:  None. FINDINGS: The bowel gas pattern is normal. No radio-opaque calculi or other significant radiographic abnormality are seen. Dextroscoliosis of the thoracolumbar spine. IMPRESSION: Negative. Electronically Signed   By: Kathreen Devoid   On: 08/16/2018 11:28   Ir Picc Placement Right >5 Yrs Inc Img Guide  Result Date: 08/01/2018 INDICATION: Patient with prior history of colon cancer, now with non-Hodgkin's lymphoma. Request received for central venous access for chemotherapy. EXAM: ULTRASOUND AND FLUOROSCOPIC GUIDED RIGHT UPPER EXTREMITY PICC LINE INSERTION MEDICATIONS: 1% lidocaine to skin and subcutaneous tissue ANESTHESIA/SEDATION: None FLUOROSCOPY TIME:  Fluoroscopy Time:  42 seconds (5 mGy). COMPLICATIONS: None immediate. TECHNIQUE: The procedure, risks, benefits, and alternatives were explained to the patient and informed written consent was obtained. A timeout was performed prior to the initiation of the procedure. The right upper extremity was prepped with chlorhexidine in a sterile fashion, and a sterile drape was applied covering the operative field. Maximum barrier sterile technique with sterile gowns and gloves were used for the procedure. A timeout was performed prior to the initiation of the procedure. Local anesthesia was provided with 1% lidocaine. Under direct ultrasound guidance, the right brachial vein was accessed with a micropuncture kit after the overlying soft tissues were  anesthetized with 1% lidocaine. An ultrasound image was saved for documentation purposes. A guidewire was advanced to the level of the superior caval-atrial junction for measurement purposes and the PICC line was cut to length. A peel-away sheath was placed and a 38 cm, 5 [place], single lumen was inserted to level of the superior caval-atrial junction. A post procedure spot fluoroscopic was obtained. The catheter easily aspirated and flushed and was sutured in place. A dressing was placed. The patient tolerated the procedure well without immediate post procedural complication. FINDINGS: After catheter placement, the tip lies within the superior cavoatrial junction. The catheter aspirates and flushes normally and is ready for immediate use. IMPRESSION: Successful ultrasound and fluoroscopic guided placement of a right brachial vein approach, 38 cm, 5 French, single lumen PICC with tip at the superior caval-atrial junction. The PICC line is ready for immediate use. Read by: Rowe Robert, PA-C Electronically Signed   By: Aletta Edouard M.D.   On: 08/01/2018 14:03    Impression:  Diffuse Large B-Cell Lymphoma    The patient is quite fatigued and her clinical status has decreased significantly since since arrival here in the [place]. She is discharged from hospital yesterday.  Plan:  Patient will be seen by by medical oncology later this morning blood work and clinical assessment. The patient may require readmission to the hospital.   -----------------------------------  Blair Promise, PhD, MD  This document serves as a record of  services personally performed by Gery Pray, MD. It was created on his behalf by Wilburn Mylar, a trained medical scribe. The creation of this record is based on the scribe's personal observations and the provider's statements to them. This document has been checked and approved by the attending provider.

## 2018-08-18 NOTE — Patient Instructions (Signed)
Decrease dexamethasone to 1/2 tablet (total 2 mg) for 3 days then stop.   Return to see Dr. Benay Spice on 08/22/2018.

## 2018-08-19 ENCOUNTER — Ambulatory Visit: Payer: Medicare Other

## 2018-08-19 DIAGNOSIS — E785 Hyperlipidemia, unspecified: Secondary | ICD-10-CM | POA: Diagnosis not present

## 2018-08-19 DIAGNOSIS — Z85038 Personal history of other malignant neoplasm of large intestine: Secondary | ICD-10-CM | POA: Diagnosis not present

## 2018-08-19 DIAGNOSIS — E860 Dehydration: Secondary | ICD-10-CM | POA: Diagnosis not present

## 2018-08-19 DIAGNOSIS — J449 Chronic obstructive pulmonary disease, unspecified: Secondary | ICD-10-CM | POA: Diagnosis not present

## 2018-08-19 DIAGNOSIS — Z9181 History of falling: Secondary | ICD-10-CM | POA: Diagnosis not present

## 2018-08-19 DIAGNOSIS — Z7982 Long term (current) use of aspirin: Secondary | ICD-10-CM | POA: Diagnosis not present

## 2018-08-19 DIAGNOSIS — I129 Hypertensive chronic kidney disease with stage 1 through stage 4 chronic kidney disease, or unspecified chronic kidney disease: Secondary | ICD-10-CM | POA: Diagnosis not present

## 2018-08-19 DIAGNOSIS — Z87891 Personal history of nicotine dependence: Secondary | ICD-10-CM | POA: Diagnosis not present

## 2018-08-19 DIAGNOSIS — C8330 Diffuse large B-cell lymphoma, unspecified site: Secondary | ICD-10-CM | POA: Diagnosis not present

## 2018-08-19 DIAGNOSIS — D631 Anemia in chronic kidney disease: Secondary | ICD-10-CM | POA: Diagnosis not present

## 2018-08-19 DIAGNOSIS — K5900 Constipation, unspecified: Secondary | ICD-10-CM | POA: Diagnosis not present

## 2018-08-19 DIAGNOSIS — G2581 Restless legs syndrome: Secondary | ICD-10-CM | POA: Diagnosis not present

## 2018-08-19 DIAGNOSIS — I951 Orthostatic hypotension: Secondary | ICD-10-CM | POA: Diagnosis not present

## 2018-08-19 DIAGNOSIS — M1990 Unspecified osteoarthritis, unspecified site: Secondary | ICD-10-CM | POA: Diagnosis not present

## 2018-08-19 DIAGNOSIS — K219 Gastro-esophageal reflux disease without esophagitis: Secondary | ICD-10-CM | POA: Diagnosis not present

## 2018-08-19 DIAGNOSIS — N183 Chronic kidney disease, stage 3 (moderate): Secondary | ICD-10-CM | POA: Diagnosis not present

## 2018-08-19 NOTE — Progress Notes (Signed)
[facility] Progress Note   Erica Escobar [id number] 1936/11/15 82 y.o.  Erica Escobar is managed by Dr. Dominica Severin B. Sherrill  Actively treated with chemotherapy/immunotherapy: yes  Current Therapy: R-CVP  Assessment: Plan:    CKD (chronic kidney disease), stage III ([place]) - Plan: Culture, Blood  Dehydration - Plan: 0.9 %  sodium chloride infusion  Nausea without vomiting - Plan: promethazine (PHENERGAN) injection 12.5 mg, promethazine (PHENERGAN) 25 MG tablet  Large cell lymphoma ([place]) - Plan: heparin lock flush 100 unit/mL, sodium chloride flush (NS) 0.9 % injection 10 mL   Chronic kidney disease: A chemistry panel returned with a creatinine of 0.78 and a BUN of 14 today.   Dehydration: The patient was given 1 L of normal saline IV today.  Nausea without vomiting: The patient was given 1 L of normal saline today and was given Phenergan 12.5 mg IV.  She was also given a prescription of Phenergan 25 mg tablets.  She was able to eat soup and drink water prior to leaving.  Large cell lymphoma: The patient is status post R-CVP under the direction of Dr. Dominica Severin B. Sherrill.  She is scheduled to follow-up with Dr. Benay Escobar on 08/22/2018.  Please see After Visit Summary for patient specific instructions.  Future Appointments  Date Time Provider [place]  08/22/2018  8:30 AM [facility] LAB 3 [facility] None  08/22/2018  8:45 AM [facility]NURSE [facility] None  08/22/2018  9:15 AM Erica Shark, NP [facility] None  08/22/2018 10:00 AM [facility] INFUSION [facility] None    Orders Placed This Encounter  Procedures  . Culture, Blood       Subjective:   Patient ID:  Erica Escobar is a 82 y.o. (DOB 1936-12-11) female.  Chief Complaint: No chief complaint on file.   HPI Erica Escobar is an 82 year old female with a history of a large cell lymphoma who is managed by Dr. Dominica Severin B. Sherrill and is status post treatment with R-CVP.  She was seen in follow-up  in radiation oncology earlier today experienced nausea with no vomiting.  She was brought in to radiation oncology in a wheelchair today but is so weak that she was brought to our clinic on a stretcher.  She is recently been hospitalized from 08/14/2018 through 08/17/2018 for acute kidney injury, dehydration, failure to thrive, pressure injury of the skin, chronic kidney disease, hyperlipidemia, and was noted to have a fecal impaction.  Her most recent abdominal x-ray completed on 08/17/2008 showing no bowel obstruction.  The patient reports that she had a bowel movement yesterday.  She has had nothing to eat or drink today.  She reports having cold intolerance and chills but denies fevers or sweats.  Medications: I have reviewed the patient's current medications.  Allergies:  Allergies  Allergen Reactions  . Azithromycin Itching  . Allopurinol Other (See Comments)    Unknown  . Cephalexin Other (See Comments)    Unknown  . Codeine Itching  . Gabapentin Swelling and Other (See Comments)    Throat and leg swelling  . Niaspan [Niacin] Other (See Comments)    Unknown  . Oxybutynin Other (See Comments)    "made me go crazy"-- had crazy dreams.   . Paxil [Paroxetine Hcl] Other (See Comments)    Unknown reaction  . Penicillins Itching and Other (See Comments)    Has patient had a PCN reaction causing immediate rash, facial/tongue/throat swelling, SOB or lightheadedness with hypotension: No Has patient had a PCN reaction  causing severe rash involving mucus membranes or skin necrosis: Yes Has patient had a PCN reaction that required hospitalization: No Has patient had a PCN reaction occurring within the last 10 years: No If all of the above answers are "NO", then may proceed with Cephalosporin use.   Marland Kitchen Pentazocine Itching, Nausea Only and Other (See Comments)    Headache.   . Tizanidine Other (See Comments)    "made me crazy"  . Tramadol Other (See Comments)    Head feels "swimmy" and  weakness  . Vicodin [Hydrocodone-Acetaminophen] Other (See Comments)    "made me go crazy"    Past Medical History:  Diagnosis Date  . Abnormal tympanic membrane    right ear and mastoid problems  . Arthritis    oseoarthritis  . Cancer of ascending colon ([place]) 02/15/2015  . Constipation, chronic   . COPD (chronic obstructive pulmonary disease) ([facility])    mild COPD  . Depression   . Diverticulosis   . GERD (gastroesophageal reflux disease)   . Glaucoma    bilateral-sugery with laser, using eye drops  . Hemorrhoids    Internal  . History of hiatal hernia    dx. '87 Schatzki's ring  . Hyperlipidemia    good control  . Hypertension   . Insomnia    hx. of  . Keratosis, actinic   . Leg cramps   . Macular degeneration   . Neuromuscular disorder ([facility])    neuropathy legs,   . On aspirin at home    chronic use  . PONV (postoperative nausea and vomiting)   . Restless leg syndrome   . Schatzki's ring 1987  . Skin cancer    multiple, "tx. area right anterior wrist"  . Urinary incontinence    occ, stress    Past Surgical History:  Procedure Laterality Date  . ABDOMINAL HYSTERECTOMY    . BLADDER REPAIR    . CARDIAC CATHETERIZATION  2007, 2009  . CATARACT EXTRACTION Bilateral   . CHOLECYSTECTOMY    . COLECTOMY    . FINGER NAIL SURGERY    . FOOT SURGERY Right   . HEMORROIDECTOMY    . INNER EAR SURGERY Right    3-4 times  . INTRAOPERATIVE ARTERIOGRAM     right arm  . KNEE SURGERY Right   . LEG SURGERY Bilateral    laser ablation  . MASTOID DEBRIDEMENT     x2  . SKIN CANCER EXCISION     right/left thumb nail, left face, right ear/neck, right leg  . TOE FUSION    . TONSILLECTOMY      No family history on file.  Social History   Socioeconomic History  . Marital status: Widowed    Spouse name: Not on file  . Number of children: Not on file  . Years of education: Not on file  . Highest education level: Not on file  Occupational History  . Not on file  Social  Needs  . Financial resource strain: Not on file  . Food insecurity:    Worry: Not on file    Inability: Not on file  . Transportation needs:    Medical: Not on file    Non-medical: Not on file  Tobacco Use  . Smoking status: Former Research scientist (life sciences)  . Smokeless tobacco: Never Used  . Tobacco comment: short time x1 - during pregnancy  Substance and Sexual Activity  . Alcohol use: No    Alcohol/week: 0.0 standard drinks  . Drug use: No  . Sexual  activity: Not Currently  Lifestyle  . Physical activity:    Days per week: Not on file    Minutes per session: Not on file  . Stress: Not on file  Relationships  . Social connections:    Talks on phone: Not on file    Gets together: Not on file    Attends religious service: Not on file    Active member of club or organization: Not on file    Attends meetings of clubs or organizations: Not on file    Relationship status: Not on file  . Intimate partner violence:    Fear of current or ex partner: Not on file    Emotionally abused: Not on file    Physically abused: Not on file    Forced sexual activity: Not on file  Other Topics Concern  . Not on file  Social History Narrative  . Not on file    Past Medical History, Surgical history, Social history, and Family history were reviewed and updated as appropriate.   Please see review of systems for further details on the patient's review from today.   Review of Systems:  Review of Systems  Constitutional: Positive for appetite change. Negative for chills, diaphoresis and fever.  Respiratory: Negative for cough, choking, shortness of breath and wheezing.   Cardiovascular: Negative for chest pain and palpitations.  Gastrointestinal: Positive for nausea. Negative for constipation, diarrhea and vomiting.  Genitourinary: Negative for decreased urine volume.  Neurological: Negative for headaches.    Objective:   Physical Exam:  BP (!) 157/82 (BP Location: Left Arm, Patient Position: Sitting)  Comment: Notified Nurse of Bp  Pulse 84   Temp 98.2 F (36.8 C) (Oral)   Resp 18   SpO2 96%  ECOG: 1  Physical Exam  Constitutional: No distress.  HENT:  Head: Normocephalic and atraumatic.  Mouth/Throat: Oropharynx is clear and moist.  Cardiovascular: Normal rate, regular rhythm and normal heart sounds. Exam reveals no gallop and no friction rub.  No murmur heard. Pulmonary/Chest: Effort normal and breath sounds normal. No stridor. No respiratory distress. She has no wheezes. She has no rales.  Abdominal: Soft. Bowel sounds are normal. She exhibits no distension and no mass. There is no tenderness. There is no rebound and no guarding.  Neurological: She is alert. Coordination normal.  Skin: Skin is warm and dry. She is not diaphoretic.    Lab Review:     Component Value Date/Time   NA 141 08/18/2018 1336   NA 141 07/20/2018   K 3.7 08/18/2018 1336   CL 106 08/18/2018 1336   CO2 27 08/18/2018 1336   GLUCOSE 77 08/18/2018 1336   BUN 14 08/18/2018 1336   BUN 43 (A) 07/20/2018   CREATININE 0.78 08/18/2018 1336   CALCIUM 8.3 (L) 08/18/2018 1336   PROT 5.1 (L) 08/18/2018 1336   ALBUMIN 2.3 (L) 08/18/2018 1336   AST 12 (L) 08/18/2018 1336   ALT 13 08/18/2018 1336   ALKPHOS 75 08/18/2018 1336   BILITOT 0.7 08/18/2018 1336   GFRNONAA >60 08/18/2018 1336   GFRAA >60 08/18/2018 1336       Component Value Date/Time   WBC 7.6 08/18/2018 1336   WBC 6.9 08/17/2018 0440   RBC 3.08 (L) 08/18/2018 1336   HGB 9.4 (L) 08/18/2018 1336   HGB 12.3 03/20/2016 1119   HCT 27.8 (L) 08/18/2018 1336   HCT 35.5 03/20/2016 1119   PLT 187 08/18/2018 1336   PLT 263 03/20/2016 1119  MCV 90.3 08/18/2018 1336   MCV 91.8 03/20/2016 1119   MCH 30.5 08/18/2018 1336   MCHC 33.8 08/18/2018 1336   RDW 14.9 (H) 08/18/2018 1336   RDW 13.3 03/20/2016 1119   LYMPHSABS 1.0 08/18/2018 1336   LYMPHSABS 1.4 03/20/2016 1119   MONOABS 0.6 08/18/2018 1336   MONOABS 0.5 03/20/2016 1119   EOSABS 0.0  08/18/2018 1336   EOSABS 0.4 03/20/2016 1119   BASOSABS 0.0 08/18/2018 1336   BASOSABS 0.0 03/20/2016 1119   -------------------------------  Imaging from last 24 hours (if applicable):  Radiology interpretation: Ct Abdomen Pelvis Wo Contrast  Result Date: 08/14/2018 CLINICAL DATA:  82 year old female with history of nausea, vomiting and abdominal pain. History of lymphoma and colon cancer. EXAM: CT ABDOMEN AND PELVIS WITHOUT CONTRAST TECHNIQUE: Multidetector CT imaging of the abdomen and pelvis was performed following the standard protocol without IV contrast. COMPARISON:  CT the abdomen and pelvis 07/12/2018. PET-CT 08/10/2018. FINDINGS: Lower chest: Calcifications of the aortic valve. Atherosclerotic calcifications in the descending thoracic aorta. Hepatobiliary: No definite cystic or solid hepatic lesions are noted on today's noncontrast CT examination. Status post cholecystectomy. Pancreas: No pancreatic mass or peripancreatic fluid or inflammatory changes are noted on today's noncontrast CT examination. Spleen: Calcified granuloma in the spleen.  Otherwise, unremarkable. Adrenals/Urinary Tract: Unenhanced appearance of kidneys and bilateral adrenal glands is normal. No hydroureteronephrosis. Urinary bladder is normal in appearance. Stomach/Bowel: Normal appearance of the stomach. No pathologic dilatation of small bowel or colon. Very large burden of well-formed stool in the rectum measuring 9.0 x 9.2 x 10.5 cm, strongly suggesting fecal impaction. Numerous colonic diverticulae are noted, without surrounding inflammatory changes to suggest an acute diverticulitis at this time. Postoperative changes of right hemicolectomy. Vascular/Lymphatic: Aortic atherosclerosis. No lymphadenopathy noted in the abdomen or pelvis. Reproductive: Status post hysterectomy. Ovaries are not confidently identified may be surgically absent or atrophic. Other: No significant volume of ascites.  No pneumoperitoneum.  Musculoskeletal: Again noted is a soft tissue mass in the inferior aspect of the right chest wall posteriorly (axial image 17 of series 2 and coronal image 79 of series 5) measuring 3.6 x 5.0 x 8.6 cm, smaller than the prior examination, with some destructive changes in the posterior aspect of the right tenth and eleventh ribs where there are pathologic fractures. There are no other aggressive appearing lytic or blastic lesions noted in the visualized portions of the skeleton. IMPRESSION: 1. Fecal impaction in the distal rectum. 2. No other acute findings are noted elsewhere in the abdomen or pelvis. 3. Colonic diverticulosis without evidence of acute diverticulitis at this time. 4. Interval decreased size of soft tissue mass in the inferior aspect of the right posterior chest wall which currently measures 3.6 x 5.0 x 8.6 cm. 5. Aortic atherosclerosis. 6. There are calcifications of the aortic valve. Echocardiographic correlation for evaluation of potential valvular dysfunction may be warranted if clinically indicated. 7. Additional incidental findings, as above. Electronically Signed   By: Vinnie Langton M.D.   On: 08/14/2018 20:21   Dg Chest 2 View  Result Date: 08/14/2018 CLINICAL DATA:  She has lymphoma and is being currently being treated for it. Generalized pain started last fall but 2 days it got worse to the point that she is unable to eat but no dental pain. States she has fallen multiple times. EXAM: CHEST - 2 VIEW COMPARISON:  Chest x-ray on 07/12/2018 FINDINGS: The patient has a RIGHT-sided PICC line, tip overlying the level of the superior vena cava. Shallow  lung inflation accentuates heart size. The lungs are clear. No pulmonary edema. Degenerative changes are seen in thoracic spine. IMPRESSION: No active cardiopulmonary disease. Electronically Signed   By: Nolon Nations M.D.   On: 08/14/2018 12:28   Dg Thoracic Spine 4v  Result Date: 08/14/2018 CLINICAL DATA:  She has lymphoma and is being  currently being treated for it. Generalized pain started last fall but 2 days it got worse to the point that she is unable to eat but no dental pain. States she has fallen multiple times. EXAM: THORACIC SPINE - 4+ VIEW COMPARISON:  None. FINDINGS: There are mild degenerative changes throughout the thoracic spine. No acute fracture or traumatic subluxation. No suspicious lytic or blastic lesions are identified. RIGHT-sided PICC line tip overlies the superior vena cava. Surgical clips are in the RIGHT UPPER QUADRANT. IMPRESSION: No evidence for acute  abnormality. Electronically Signed   By: Nolon Nations M.D.   On: 08/14/2018 12:27   Dg Lumbar Spine Complete  Result Date: 08/14/2018 CLINICAL DATA:  Low back pain. Status post fall 2 days ago. Initial encounter. EXAM: LUMBAR SPINE - COMPLETE 4+ VIEW COMPARISON:  Plain films lumbar spine 05/25/2018. FINDINGS: No fracture is identified. Convex right scoliosis and severe multilevel degenerative disease are stable in appearance. Large stool ball is noted in the rectum. IMPRESSION: No acute abnormality. Convex right scoliosis and advanced multilevel degenerative disease. Large stool ball in the rectum compatible with fecal impaction. Electronically Signed   By: Inge Rise M.D.   On: 08/14/2018 12:28   Ct Head Wo Contrast  Result Date: 08/14/2018 CLINICAL DATA:  Multiple falls last week. EXAM: CT HEAD WITHOUT CONTRAST CT CERVICAL SPINE WITHOUT CONTRAST TECHNIQUE: Multidetector CT imaging of the head and cervical spine was performed following the standard protocol without intravenous contrast. Multiplanar CT image reconstructions of the cervical spine were also generated. COMPARISON:  CT head dated August 08, 2015. CT cervical spine dated March 10, 2009. FINDINGS: CT HEAD FINDINGS Brain: No evidence of acute infarction, hemorrhage, hydrocephalus, extra-axial collection or mass lesion/mass effect. Stable mild atrophy and chronic microvascular ischemic changes.  Vascular: Atherosclerotic vascular calcification of the carotid siphons. No hyperdense vessel. Skull: Negative for fracture. Unchanged right parietal bone osteoma. Sinuses/Orbits: No acute finding. Prior right mastoidectomy. Chronic opacification of the right sphenoid sinus. Other: None. CT CERVICAL SPINE FINDINGS Alignment: 3 mm anterolisthesis at C7-T1 due to facet arthropathy. No traumatic malalignment. Skull base and vertebrae: No acute fracture. No primary bone lesion or focal pathologic process. Soft tissues and spinal canal: No prevertebral fluid or swelling. No visible canal hematoma. Disc levels: Moderate multilevel disc height loss and facet uncovertebral hypertrophy throughout the cervical spine Upper chest: Negative. Other: None. IMPRESSION: 1. No acute intracranial abnormality. Stable mild atrophy and chronic microvascular ischemic changes. 2. No acute cervical spine fracture. Moderate cervical spondylosis, progressed since 2010. Electronically Signed   By: Titus Dubin M.D.   On: 08/14/2018 12:38   Ct Cervical Spine Wo Contrast  Result Date: 08/14/2018 CLINICAL DATA:  Multiple falls last week. EXAM: CT HEAD WITHOUT CONTRAST CT CERVICAL SPINE WITHOUT CONTRAST TECHNIQUE: Multidetector CT imaging of the head and cervical spine was performed following the standard protocol without intravenous contrast. Multiplanar CT image reconstructions of the cervical spine were also generated. COMPARISON:  CT head dated August 08, 2015. CT cervical spine dated March 10, 2009. FINDINGS: CT HEAD FINDINGS Brain: No evidence of acute infarction, hemorrhage, hydrocephalus, extra-axial collection or mass lesion/mass effect. Stable mild atrophy and chronic  microvascular ischemic changes. Vascular: Atherosclerotic vascular calcification of the carotid siphons. No hyperdense vessel. Skull: Negative for fracture. Unchanged right parietal bone osteoma. Sinuses/Orbits: No acute finding. Prior right mastoidectomy. Chronic  opacification of the right sphenoid sinus. Other: None. CT CERVICAL SPINE FINDINGS Alignment: 3 mm anterolisthesis at C7-T1 due to facet arthropathy. No traumatic malalignment. Skull base and vertebrae: No acute fracture. No primary bone lesion or focal pathologic process. Soft tissues and spinal canal: No prevertebral fluid or swelling. No visible canal hematoma. Disc levels: Moderate multilevel disc height loss and facet uncovertebral hypertrophy throughout the cervical spine Upper chest: Negative. Other: None. IMPRESSION: 1. No acute intracranial abnormality. Stable mild atrophy and chronic microvascular ischemic changes. 2. No acute cervical spine fracture. Moderate cervical spondylosis, progressed since 2010. Electronically Signed   By: Titus Dubin M.D.   On: 08/14/2018 12:38   Nm Pet Image Initial (pi) Skull Base To Thigh  Result Date: 08/11/2018 CLINICAL DATA:  Initial treatment strategy for diffuse large B-cell lymphoma. EXAM: NUCLEAR MEDICINE PET SKULL BASE TO THIGH TECHNIQUE: 7.3 mCi F-18 FDG was injected intravenously. Full-ring PET imaging was performed from the skull base to thigh after the radiotracer. CT data was obtained and used for attenuation correction and anatomic localization. Fasting blood glucose: 115 mg/dl COMPARISON:  CT 07/12/2018 FINDINGS: Mediastinal blood pool activity: SUV max 3.1 NECK: No hypermetabolic lymph nodes in the neck. Incidental CT findings: none CHEST: No hypermetabolic mediastinal or hilar nodes. No suspicious pulmonary nodules on the CT scan. Short segment of hypermetabolic activity in the distal esophagus approximately 3 cm above the GE junction. No additional abnormal activity esophagus. No mass lesion or obstruction identified on the CT portion exam Incidental CT findings: RIGHT PICC line noted. ABDOMEN/PELVIS: No abnormal hypermetabolic activity within the liver, pancreas, adrenal glands, or spleen. No hypermetabolic lymph nodes in the abdomen or pelvis. RIGHT  hemicolectomy. Metabolic activity associated with hustral contraction in the transverse colon is favored benign. Incidental CT findings: Post hysterectomy SKELETON: The RIGHT paraspinal mass at the T10 - T12 vertebral body level is decreased significantly in size measuring 3.3 cm in thickness compared to 5.4 cm in thickness on comparison exam. The activity of through this region is low with SUV max equal 4.4 (reference liver activity equal SUV max 3.9). There is metabolic activity associated with the paraspinal musculature superiorly and inferior to the mass lesion extending from the sacrum all the way to the perispinal neck musculature. This is favored physiologic/traumatic/inflammatory (benign). Vertebral body bone destruction noted centrally within the RIGHT paraspinal mass as described previously. Incidental CT findings: none IMPRESSION: 1. Relatively mild metabolic activity ( [place] 3/4) within the RIGHT paraspinal mass which is decreased significantly in size from comparison exam is consistent positive response to first cycle chemotherapy. 2. No evidence of lymphoma elsewhere on the skull base to thigh FDG PET scan. 3. Focal activity within the distal esophagus above the GE junction without mass lesion or obstruction. Differential includes focal esophagitis versus CT occult neoplasm. Consider upper endoscopy. 4. Diffuse right paraspinal musculature metabolic activity superior and inferior to the right paraspinal mass on the RIGHT is favored benign as described above. Electronically Signed   By: Suzy Bouchard M.D.   On: 08/11/2018 09:34   Dg Abd Portable 1v  Result Date: 08/16/2018 CLINICAL DATA:  Constipation EXAM: PORTABLE ABDOMEN - 1 VIEW COMPARISON:  None. FINDINGS: The bowel gas pattern is normal. No radio-opaque calculi or other significant radiographic abnormality are seen. Dextroscoliosis of the thoracolumbar spine. IMPRESSION:  Negative. Electronically Signed   By: Kathreen Devoid   On: 08/16/2018  11:28   Ir Picc Placement Right >5 Yrs Inc Img Guide  Result Date: 08/01/2018 INDICATION: Patient with prior history of colon cancer, now with non-Hodgkin's lymphoma. Request received for central venous access for chemotherapy. EXAM: ULTRASOUND AND FLUOROSCOPIC GUIDED RIGHT UPPER EXTREMITY PICC LINE INSERTION MEDICATIONS: 1% lidocaine to skin and subcutaneous tissue ANESTHESIA/SEDATION: None FLUOROSCOPY TIME:  Fluoroscopy Time:  42 seconds (5 mGy). COMPLICATIONS: None immediate. TECHNIQUE: The procedure, risks, benefits, and alternatives were explained to the patient and informed written consent was obtained. A timeout was performed prior to the initiation of the procedure. The right upper extremity was prepped with chlorhexidine in a sterile fashion, and a sterile drape was applied covering the operative field. Maximum barrier sterile technique with sterile gowns and gloves were used for the procedure. A timeout was performed prior to the initiation of the procedure. Local anesthesia was provided with 1% lidocaine. Under direct ultrasound guidance, the right brachial vein was accessed with a micropuncture kit after the overlying soft tissues were anesthetized with 1% lidocaine. An ultrasound image was saved for documentation purposes. A guidewire was advanced to the level of the superior caval-atrial junction for measurement purposes and the PICC line was cut to length. A peel-away sheath was placed and a 38 cm, 5 [place], single lumen was inserted to level of the superior caval-atrial junction. A post procedure spot fluoroscopic was obtained. The catheter easily aspirated and flushed and was sutured in place. A dressing was placed. The patient tolerated the procedure well without immediate post procedural complication. FINDINGS: After catheter placement, the tip lies within the superior cavoatrial junction. The catheter aspirates and flushes normally and is ready for immediate use. IMPRESSION: Successful  ultrasound and fluoroscopic guided placement of a right brachial vein approach, 38 cm, 5 French, single lumen PICC with tip at the superior caval-atrial junction. The PICC line is ready for immediate use. Read by: Rowe Robert, PA-C Electronically Signed   By: Aletta Edouard M.D.   On: 08/01/2018 14:03        This case was discussed with Dr. Benay Escobar. He expressed agreement with my management of this patient.

## 2018-08-19 NOTE — Progress Notes (Signed)
These preliminary result these preliminary results were noted.  Awaiting final report.

## 2018-08-20 ENCOUNTER — Other Ambulatory Visit: Payer: Self-pay | Admitting: Oncology

## 2018-08-22 ENCOUNTER — Inpatient Hospital Stay: Payer: Medicare Other

## 2018-08-22 ENCOUNTER — Encounter: Payer: Self-pay | Admitting: Nurse Practitioner

## 2018-08-22 ENCOUNTER — Inpatient Hospital Stay (HOSPITAL_BASED_OUTPATIENT_CLINIC_OR_DEPARTMENT_OTHER): Payer: Medicare Other | Admitting: Nurse Practitioner

## 2018-08-22 ENCOUNTER — Telehealth: Payer: Self-pay | Admitting: Nurse Practitioner

## 2018-08-22 VITALS — BP 125/81 | HR 107 | Temp 98.5°F | Resp 20 | Ht 63.0 in | Wt 144.6 lb

## 2018-08-22 VITALS — BP 128/74 | HR 87 | Temp 98.4°F | Resp 18

## 2018-08-22 DIAGNOSIS — Z87891 Personal history of nicotine dependence: Secondary | ICD-10-CM | POA: Diagnosis not present

## 2018-08-22 DIAGNOSIS — C8339 Diffuse large B-cell lymphoma, extranodal and solid organ sites: Secondary | ICD-10-CM

## 2018-08-22 DIAGNOSIS — C8580 Other specified types of non-Hodgkin lymphoma, unspecified site: Secondary | ICD-10-CM

## 2018-08-22 DIAGNOSIS — G4700 Insomnia, unspecified: Secondary | ICD-10-CM | POA: Diagnosis not present

## 2018-08-22 DIAGNOSIS — I700 Atherosclerosis of aorta: Secondary | ICD-10-CM | POA: Diagnosis not present

## 2018-08-22 DIAGNOSIS — C8330 Diffuse large B-cell lymphoma, unspecified site: Secondary | ICD-10-CM

## 2018-08-22 DIAGNOSIS — Z79899 Other long term (current) drug therapy: Secondary | ICD-10-CM

## 2018-08-22 DIAGNOSIS — E860 Dehydration: Secondary | ICD-10-CM | POA: Diagnosis not present

## 2018-08-22 DIAGNOSIS — Z5112 Encounter for antineoplastic immunotherapy: Secondary | ICD-10-CM | POA: Diagnosis not present

## 2018-08-22 DIAGNOSIS — N189 Chronic kidney disease, unspecified: Secondary | ICD-10-CM | POA: Diagnosis not present

## 2018-08-22 DIAGNOSIS — R627 Adult failure to thrive: Secondary | ICD-10-CM | POA: Diagnosis not present

## 2018-08-22 DIAGNOSIS — I129 Hypertensive chronic kidney disease with stage 1 through stage 4 chronic kidney disease, or unspecified chronic kidney disease: Secondary | ICD-10-CM | POA: Diagnosis not present

## 2018-08-22 DIAGNOSIS — Z85828 Personal history of other malignant neoplasm of skin: Secondary | ICD-10-CM

## 2018-08-22 DIAGNOSIS — Z9049 Acquired absence of other specified parts of digestive tract: Secondary | ICD-10-CM | POA: Diagnosis not present

## 2018-08-22 DIAGNOSIS — H409 Unspecified glaucoma: Secondary | ICD-10-CM | POA: Diagnosis not present

## 2018-08-22 DIAGNOSIS — Z85038 Personal history of other malignant neoplasm of large intestine: Secondary | ICD-10-CM | POA: Diagnosis not present

## 2018-08-22 DIAGNOSIS — Z23 Encounter for immunization: Secondary | ICD-10-CM | POA: Diagnosis not present

## 2018-08-22 DIAGNOSIS — E785 Hyperlipidemia, unspecified: Secondary | ICD-10-CM | POA: Diagnosis not present

## 2018-08-22 DIAGNOSIS — J449 Chronic obstructive pulmonary disease, unspecified: Secondary | ICD-10-CM | POA: Diagnosis not present

## 2018-08-22 DIAGNOSIS — Z7689 Persons encountering health services in other specified circumstances: Secondary | ICD-10-CM | POA: Diagnosis not present

## 2018-08-22 DIAGNOSIS — G2581 Restless legs syndrome: Secondary | ICD-10-CM | POA: Diagnosis not present

## 2018-08-22 DIAGNOSIS — K219 Gastro-esophageal reflux disease without esophagitis: Secondary | ICD-10-CM | POA: Diagnosis not present

## 2018-08-22 LAB — CMP (CANCER CENTER ONLY)
ALBUMIN: 2.6 g/dL — AB (ref 3.5–5.0)
ALT: 13 U/L (ref 0–44)
ANION GAP: 10 (ref 5–15)
AST: 16 U/L (ref 15–41)
Alkaline Phosphatase: 87 U/L (ref 38–126)
BILIRUBIN TOTAL: 0.9 mg/dL (ref 0.3–1.2)
BUN: 10 mg/dL (ref 8–23)
CHLORIDE: 106 mmol/L (ref 98–111)
CO2: 25 mmol/L (ref 22–32)
Calcium: 8.6 mg/dL — ABNORMAL LOW (ref 8.9–10.3)
Creatinine: 0.74 mg/dL (ref 0.44–1.00)
GFR, Est AFR Am: >60 mL/min (ref >60)
GFR, Estimated: >60 mL/min (ref >60)
Glucose, Bld: 89 mg/dL (ref 70–99)
POTASSIUM: 3.7 mmol/L (ref 3.5–5.1)
SODIUM: 141 mmol/L (ref 135–145)
TOTAL PROTEIN: 5.5 g/dL — AB (ref 6.5–8.1)

## 2018-08-22 LAB — CBC WITH DIFFERENTIAL (CANCER CENTER ONLY)
BASOS ABS: 0 10*3/uL (ref 0.0–0.1)
BASOS PCT: 0 %
EOS ABS: 0 10*3/uL (ref 0.0–0.5)
Eosinophils Relative: 0 %
HCT: 27.4 % — ABNORMAL LOW (ref 34.8–46.6)
Hemoglobin: 9.3 g/dL — ABNORMAL LOW (ref 11.6–15.9)
Lymphocytes Relative: 23 %
Lymphs Abs: 1.7 10*3/uL (ref 0.9–3.3)
MCH: 30.9 pg (ref 25.1–34.0)
MCHC: 33.9 g/dL (ref 31.5–36.0)
MCV: 91 fL (ref 79.5–101.0)
MONO ABS: 0.5 10*3/uL (ref 0.1–0.9)
Monocytes Relative: 7 %
Neutro Abs: 5.2 10*3/uL (ref 1.5–6.5)
Neutrophils Relative %: 70 %
PLATELETS: 315 10*3/uL (ref 145–400)
RBC: 3.01 MIL/uL — ABNORMAL LOW (ref 3.70–5.45)
RDW: 16 % — AB (ref 11.2–14.5)
WBC Count: 7.5 10*3/uL (ref 3.9–10.3)

## 2018-08-22 LAB — URIC ACID: Uric Acid, Serum: 3.2 mg/dL (ref 2.5–7.1)

## 2018-08-22 MED ORDER — DIPHENHYDRAMINE HCL 25 MG PO CAPS
ORAL_CAPSULE | ORAL | Status: AC
  Filled 2018-08-22: qty 1

## 2018-08-22 MED ORDER — SODIUM CHLORIDE 0.9% FLUSH
10.0000 mL | INTRAVENOUS | Status: DC | PRN
  Administered 2018-08-22: 10 mL
  Filled 2018-08-22: qty 10

## 2018-08-22 MED ORDER — SODIUM CHLORIDE 0.9 % IV SOLN
750.0000 mg/m2 | Freq: Once | INTRAVENOUS | Status: AC
  Administered 2018-08-22: 1300 mg via INTRAVENOUS
  Filled 2018-08-22: qty 65

## 2018-08-22 MED ORDER — SODIUM CHLORIDE 0.9% FLUSH
10.0000 mL | Freq: Once | INTRAVENOUS | Status: AC
  Administered 2018-08-22: 10 mL
  Filled 2018-08-22: qty 10

## 2018-08-22 MED ORDER — DIPHENHYDRAMINE HCL 25 MG PO CAPS
25.0000 mg | ORAL_CAPSULE | Freq: Once | ORAL | Status: AC
  Administered 2018-08-22: 25 mg via ORAL

## 2018-08-22 MED ORDER — PALONOSETRON HCL INJECTION 0.25 MG/5ML
0.2500 mg | Freq: Once | INTRAVENOUS | Status: AC
  Administered 2018-08-22: 0.25 mg via INTRAVENOUS

## 2018-08-22 MED ORDER — ACETAMINOPHEN 325 MG PO TABS
650.0000 mg | ORAL_TABLET | Freq: Once | ORAL | Status: AC
  Administered 2018-08-22: 650 mg via ORAL

## 2018-08-22 MED ORDER — PREDNISONE 20 MG PO TABS: ORAL_TABLET | ORAL | 0 refills | Status: DC

## 2018-08-22 MED ORDER — PALONOSETRON HCL INJECTION 0.25 MG/5ML
INTRAVENOUS | Status: AC
  Filled 2018-08-22: qty 5

## 2018-08-22 MED ORDER — VINCRISTINE SULFATE CHEMO INJECTION 1 MG/ML
1.0000 mg | Freq: Once | INTRAVENOUS | Status: AC
  Administered 2018-08-22: 1 mg via INTRAVENOUS
  Filled 2018-08-22: qty 1

## 2018-08-22 MED ORDER — SODIUM CHLORIDE 0.9 % IV SOLN
375.0000 mg/m2 | Freq: Once | INTRAVENOUS | Status: AC
  Administered 2018-08-22: 700 mg via INTRAVENOUS
  Filled 2018-08-22: qty 50

## 2018-08-22 MED ORDER — HEPARIN SOD (PORK) LOCK FLUSH 100 UNIT/ML IV SOLN
500.0000 [IU] | Freq: Once | INTRAVENOUS | Status: AC
  Administered 2018-08-22: 250 [IU]
  Filled 2018-08-22: qty 5

## 2018-08-22 MED ORDER — ACETAMINOPHEN 325 MG PO TABS
ORAL_TABLET | ORAL | Status: AC
  Filled 2018-08-22: qty 2

## 2018-08-22 MED ORDER — HEPARIN SOD (PORK) LOCK FLUSH 100 UNIT/ML IV SOLN
500.0000 [IU] | Freq: Once | INTRAVENOUS | Status: AC | PRN
  Administered 2018-08-22: 250 [IU]
  Filled 2018-08-22: qty 5

## 2018-08-22 MED ORDER — DEXAMETHASONE SODIUM PHOSPHATE 10 MG/ML IJ SOLN
10.0000 mg | Freq: Once | INTRAMUSCULAR | Status: AC
  Administered 2018-08-22: 10 mg via INTRAVENOUS

## 2018-08-22 MED ORDER — DEXAMETHASONE SODIUM PHOSPHATE 10 MG/ML IJ SOLN
INTRAMUSCULAR | Status: AC
  Filled 2018-08-22: qty 1

## 2018-08-22 MED ORDER — SODIUM CHLORIDE 0.9 % IV SOLN: 375.0000 mg/m2 | Freq: Once | INTRAVENOUS | Status: DC

## 2018-08-22 MED ORDER — SODIUM CHLORIDE 0.9 % IV SOLN
Freq: Once | INTRAVENOUS | Status: AC
  Administered 2018-08-22: 11:00:00 via INTRAVENOUS
  Filled 2018-08-22: qty 250

## 2018-08-22 NOTE — Progress Notes (Addendum)
[place] OFFICE PROGRESS NOTE   Diagnosis: Non-Hodgkin's lymphoma  INTERVAL HISTORY:   Erica Escobar returns as scheduled.  She completed cycle 1 CVP/Rituxan 08/02/2018.  She developed severe neutropenia and anemia.  She received Granix and was transfused packed red cells.  She has mild nausea at present.  She did not eat breakfast.  Appetite overall is poor.  She had a bowel movement yesterday.  She is taking a laxative.  Back pain is better.  She is participating in physical therapy at home.  Objective:  Vital signs in last 24 hours:  Blood pressure 125/81, pulse (!) 107, temperature 98.5 F (36.9 C), temperature source Oral, resp. rate 20, height 5' 3"  (1.6 m), weight 144 lb 9.6 oz (65.6 kg), SpO2 100 %.    HEENT: No thrush or ulcers. Resp: Lungs clear bilaterally. Cardio: Regular rate and rhythm. GI: Abdomen soft and nontender.  No hepatospleno megaly. Vascular: Trace pitting edema at the lower legs bilaterally. Neuro: Alert and oriented.  Right upper extremity PIC without erythema.  Lab Results:  Lab Results  Component Value Date   WBC 7.5 08/22/2018   HGB 9.3 (L) 08/22/2018   HCT 27.4 (L) 08/22/2018   MCV 91.0 08/22/2018   PLT 315 08/22/2018   NEUTROABS 5.2 08/22/2018    Imaging:  No results found.  Medications: I have reviewed the patient's current medications.  Assessment/Plan: 1. 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 Loss of MLH1 and PMS2, MSI-high, BRAF mutation detected ? Colonoscopy February 2017 ? Colonoscopy 12/16/2017-3 mm polyp in the transverse colon, 10 mm polyp in the sigmoid colon. Diverticulosis in the sigmoid colon. Transverse colon polyp, tubular adenoma; sigmoid colon polyp, tubular adenoma. ? CT 07/12/2018-probable subcutaneous metastasis at the left abdominal wall, left perinephric mass, destructive thoracic/lumbar paraspinous  mass ? CT-guided biopsy of the paraspinous mass 07/13/2018  2. Anemia-likely secondary to bleeding from the colon cancer and surgery. Hemoglobin in normal range 03/20/2016, mild persistent anemia 3. Ascending colon "polyps" removed on the colonoscopy 12/22/2014 with the pathology revealing poorly differentiated adenocarcinoma and a sessile serrated adenoma polyp, similar to the pathology on the hepatic flexure mass 4. History of multiple skin cancers. Seen by dermatology every 3 months. 5. Glaucoma 6.Non-Hodgkin's lymphoma presenting with severe back pain  CTabdomen/pelvis7/30/2019-large destructive right paraspinal chest wall mass lower thoracic/upper lumbar spinewith associated bone destruction involving the right posterior elements of T9, T10 and T11, spinous processes of T8 and T9, and the posterior right 10th and 11th ribs. Probable subcutaneous metastasis anterior left abdominal wall 2.2 cm; enlarging mass in the left perinephric space 3.0 cm in size.  CT L-spine 07/12/2018-paraspinous soft tissue mass on the right at the lower thoracic spine causing destruction of the right T11 and 12 transverse processes; may encroach on the right T11 neural foramen.  CT-guided biopsy paraspinous mass 07/13/2018-poorly differentiated malignant neoplasm; CD45, CD20,bcl-6 andbcl-2positive. 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  Radiationto paraspinous massinitiated 07/14/2018  Cycle 1 CVP/Rituxan 08/02/2018  PET scan 08/10/2018- relatively mild metabolic activity within the right paraspinal mass which is decreased significantly in size from the comparison exam.  No evidence of lymphoma elsewhere.  Focal activity within the distal esophagus above the GE junction without mass lesion or obstruction.  Cycle 2 CVP/Rituxan 08/22/2018, udenyca added 7.Anorexia/weight loss 8.Mild elevation of calcium-questionhypercalcemia of  malignancy 9.Severe neutropeniaand anemiasecondary to radiation and chemotherapy; udenyca added with cycle 2  CVP/Rituxan   Disposition: Erica Escobar appears stable.  She has completed 1 cycle of CVP/Rituxan.  She developed severe neutropenia and required Granix. Back pain is better and overall she appears improved.  Plan to proceed with cycle 2 CVP/Rituxan today as scheduled.  She will receive Udenyca tomorrow.  She will return for a nadir CBC on 09/01/2018.  She will return for lab, follow-up and cycle 3 CVP/Rituxan in 3 weeks.  She will contact the office in the interim with any problems.  Patient seen with Dr. Benay Spice.   Ned Card ANP/GNP-BC   08/22/2018  9:44 AM This was a shared visit with Ned Card.  Erica Escobar appears stable to proceed with cycle 2 CVP/rituximab.  She will receive G-CSF support with this cycle.  Julieanne Manson, MD

## 2018-08-22 NOTE — Patient Instructions (Signed)
[facility] Discharge Instructions for Patients Receiving Chemotherapy  Today you received the following chemotherapy agents Vincristine, Cytoxan and Rituxan   To help prevent nausea and vomiting after your treatment, we encourage you to take your nausea medication as directed.    If you develop nausea and vomiting that is not controlled by your nausea medication, call the clinic.   BELOW ARE SYMPTOMS THAT SHOULD BE REPORTED IMMEDIATELY:  *FEVER GREATER THAN 100.5 F  *CHILLS WITH OR WITHOUT FEVER  NAUSEA AND VOMITING THAT IS NOT CONTROLLED WITH YOUR NAUSEA MEDICATION  *UNUSUAL SHORTNESS OF BREATH  *UNUSUAL BRUISING OR BLEEDING  TENDERNESS IN MOUTH AND THROAT WITH OR WITHOUT PRESENCE OF ULCERS  *URINARY PROBLEMS  *BOWEL PROBLEMS  UNUSUAL RASH Items with * indicate a potential emergency and should be followed up as soon as possible.  Feel free to call the clinic should you have any questions or concerns. The clinic phone number is [phone].  Please show the [place] at check-in to the Emergency Department and triage nurse.

## 2018-08-22 NOTE — Telephone Encounter (Signed)
Scheduled appt per 9/9 los - gave patient AVS and calender per los.

## 2018-08-23 ENCOUNTER — Ambulatory Visit: Payer: Medicare Other | Admitting: Nurse Practitioner

## 2018-08-23 ENCOUNTER — Other Ambulatory Visit: Payer: Self-pay

## 2018-08-23 ENCOUNTER — Inpatient Hospital Stay: Payer: Medicare Other

## 2018-08-23 DIAGNOSIS — Z85828 Personal history of other malignant neoplasm of skin: Secondary | ICD-10-CM | POA: Diagnosis not present

## 2018-08-23 DIAGNOSIS — E785 Hyperlipidemia, unspecified: Secondary | ICD-10-CM | POA: Diagnosis not present

## 2018-08-23 DIAGNOSIS — C8580 Other specified types of non-Hodgkin lymphoma, unspecified site: Secondary | ICD-10-CM

## 2018-08-23 DIAGNOSIS — C8339 Diffuse large B-cell lymphoma, extranodal and solid organ sites: Secondary | ICD-10-CM | POA: Diagnosis not present

## 2018-08-23 DIAGNOSIS — N189 Chronic kidney disease, unspecified: Secondary | ICD-10-CM | POA: Diagnosis not present

## 2018-08-23 DIAGNOSIS — I700 Atherosclerosis of aorta: Secondary | ICD-10-CM | POA: Diagnosis not present

## 2018-08-23 DIAGNOSIS — H409 Unspecified glaucoma: Secondary | ICD-10-CM | POA: Diagnosis not present

## 2018-08-23 DIAGNOSIS — J449 Chronic obstructive pulmonary disease, unspecified: Secondary | ICD-10-CM | POA: Diagnosis not present

## 2018-08-23 DIAGNOSIS — G4700 Insomnia, unspecified: Secondary | ICD-10-CM | POA: Diagnosis not present

## 2018-08-23 DIAGNOSIS — Z9049 Acquired absence of other specified parts of digestive tract: Secondary | ICD-10-CM | POA: Diagnosis not present

## 2018-08-23 DIAGNOSIS — Z5112 Encounter for antineoplastic immunotherapy: Secondary | ICD-10-CM | POA: Diagnosis not present

## 2018-08-23 DIAGNOSIS — I129 Hypertensive chronic kidney disease with stage 1 through stage 4 chronic kidney disease, or unspecified chronic kidney disease: Secondary | ICD-10-CM | POA: Diagnosis not present

## 2018-08-23 DIAGNOSIS — Z7689 Persons encountering health services in other specified circumstances: Secondary | ICD-10-CM | POA: Diagnosis not present

## 2018-08-23 DIAGNOSIS — Z23 Encounter for immunization: Secondary | ICD-10-CM | POA: Diagnosis not present

## 2018-08-23 DIAGNOSIS — Z87891 Personal history of nicotine dependence: Secondary | ICD-10-CM | POA: Diagnosis not present

## 2018-08-23 DIAGNOSIS — E860 Dehydration: Secondary | ICD-10-CM | POA: Diagnosis not present

## 2018-08-23 DIAGNOSIS — G2581 Restless legs syndrome: Secondary | ICD-10-CM | POA: Diagnosis not present

## 2018-08-23 DIAGNOSIS — Z79899 Other long term (current) drug therapy: Secondary | ICD-10-CM | POA: Diagnosis not present

## 2018-08-23 DIAGNOSIS — K219 Gastro-esophageal reflux disease without esophagitis: Secondary | ICD-10-CM | POA: Diagnosis not present

## 2018-08-23 DIAGNOSIS — R627 Adult failure to thrive: Secondary | ICD-10-CM | POA: Diagnosis not present

## 2018-08-23 DIAGNOSIS — Z85038 Personal history of other malignant neoplasm of large intestine: Secondary | ICD-10-CM | POA: Diagnosis not present

## 2018-08-23 LAB — CULTURE, BLOOD (SINGLE)
Culture: NO GROWTH
Culture: NO GROWTH
SPECIAL REQUESTS: ADEQUATE
Special Requests: ADEQUATE

## 2018-08-23 MED ORDER — PEGFILGRASTIM INJECTION 6 MG/0.6ML ~~LOC~~
6.0000 mg | PREFILLED_SYRINGE | Freq: Once | SUBCUTANEOUS | Status: AC
  Administered 2018-08-23: 6 mg via SUBCUTANEOUS

## 2018-08-23 NOTE — Patient Instructions (Signed)
Pegfilgrastim injection What is this medicine? PEGFILGRASTIM (PEG fil gra stim) is a long-acting granulocyte colony-stimulating factor that stimulates the growth of neutrophils, a type of white blood cell important in the body's fight against infection. It is used to reduce the incidence of fever and infection in patients with certain types of cancer who are receiving chemotherapy that affects the bone marrow, and to increase survival after being exposed to high doses of radiation. This medicine may be used for other purposes; ask your health care provider or pharmacist if you have questions. COMMON BRAND NAME(S): Neulasta What should I tell my health care provider before I take this medicine? They need to know if you have any of these conditions: -kidney disease -latex allergy -ongoing radiation therapy -sickle cell disease -skin reactions to acrylic adhesives (On-Body Injector only) -an unusual or allergic reaction to pegfilgrastim, filgrastim, other medicines, foods, dyes, or preservatives -pregnant or trying to get pregnant -breast-feeding How should I use this medicine? This medicine is for injection under the skin. If you get this medicine at home, you will be taught how to prepare and give the pre-filled syringe or how to use the On-body Injector. Refer to the patient Instructions for Use for detailed instructions. Use exactly as directed. Tell your healthcare provider immediately if you suspect that the On-body Injector may not have performed as intended or if you suspect the use of the On-body Injector resulted in a missed or partial dose. It is important that you put your used needles and syringes in a special sharps container. Do not put them in a trash can. If you do not have a sharps container, call your pharmacist or healthcare provider to get one. Talk to your pediatrician regarding the use of this medicine in children. While this drug may be prescribed for selected conditions,  precautions do apply. Overdosage: If you think you have taken too much of this medicine contact a poison control center or emergency room at once. NOTE: This medicine is only for you. Do not share this medicine with others. What if I miss a dose? It is important not to miss your dose. Call your doctor or health care professional if you miss your dose. If you miss a dose due to an On-body Injector failure or leakage, a new dose should be administered as soon as possible using a single prefilled syringe for manual use. What may interact with this medicine? Interactions have not been studied. Give your health care provider a list of all the medicines, herbs, non-prescription drugs, or dietary supplements you use. Also tell them if you smoke, drink alcohol, or use illegal drugs. Some items may interact with your medicine. This list may not describe all possible interactions. Give your health care provider a list of all the medicines, herbs, non-prescription drugs, or dietary supplements you use. Also tell them if you smoke, drink alcohol, or use illegal drugs. Some items may interact with your medicine. What should I watch for while using this medicine? You may need blood work done while you are taking this medicine. If you are going to need a MRI, CT scan, or other procedure, tell your doctor that you are using this medicine (On-Body Injector only). What side effects may I notice from receiving this medicine? Side effects that you should report to your doctor or health care professional as soon as possible: -allergic reactions like skin rash, itching or hives, swelling of the face, lips, or tongue -dizziness -fever -pain, redness, or irritation at site  where injected -pinpoint red spots on the skin -red or dark-brown urine -shortness of breath or breathing problems -stomach or side pain, or pain at the shoulder -swelling -tiredness -trouble passing urine or change in the amount of urine Side  effects that usually do not require medical attention (report to your doctor or health care professional if they continue or are bothersome): -bone pain -muscle pain This list may not describe all possible side effects. Call your doctor for medical advice about side effects. You may report side effects to FDA at 1-800-FDA-1088. Where should I keep my medicine? Keep out of the reach of children. Store pre-filled syringes in a refrigerator between 2 and 8 degrees C (36 and 46 degrees F). Do not freeze. Keep in carton to protect from light. Throw away this medicine if it is left out of the refrigerator for more than 48 hours. Throw away any unused medicine after the expiration date. NOTE: This sheet is a summary. It may not cover all possible information. If you have questions about this medicine, talk to your doctor, pharmacist, or health care provider.  2018 Elsevier/Gold Standard (2016-11-26 12:58:03)

## 2018-08-23 NOTE — Patient Outreach (Signed)
[facility]) Care Management  08/23/2018  GIANNAH ZAVADIL 28-Sep-1936 [id number]  EMMI- General Discharge RED ON EMMI ALERT Day # 1  Date: 08/23/18 Red Alert Reason: Lost interest in things  Outreach attempt: spoke with patient. She is able to verify HIPAA.  Discussed with patient red alert.  Patient states that was not correct.  Patient PHQ-2=0.  Patient declined any present needs.    Plan: RN CM will close case.    Jone Baseman, RN, MSN [facility] Care Management Care Management Coordinator Direct Line [phone] Toll Free: [phone]  Fax: [phone]

## 2018-08-24 DIAGNOSIS — Z85038 Personal history of other malignant neoplasm of large intestine: Secondary | ICD-10-CM | POA: Diagnosis not present

## 2018-08-24 DIAGNOSIS — I951 Orthostatic hypotension: Secondary | ICD-10-CM | POA: Diagnosis not present

## 2018-08-24 DIAGNOSIS — E860 Dehydration: Secondary | ICD-10-CM | POA: Diagnosis not present

## 2018-08-24 DIAGNOSIS — Z87891 Personal history of nicotine dependence: Secondary | ICD-10-CM | POA: Diagnosis not present

## 2018-08-24 DIAGNOSIS — M1990 Unspecified osteoarthritis, unspecified site: Secondary | ICD-10-CM | POA: Diagnosis not present

## 2018-08-24 DIAGNOSIS — D631 Anemia in chronic kidney disease: Secondary | ICD-10-CM | POA: Diagnosis not present

## 2018-08-24 DIAGNOSIS — K219 Gastro-esophageal reflux disease without esophagitis: Secondary | ICD-10-CM | POA: Diagnosis not present

## 2018-08-24 DIAGNOSIS — C8330 Diffuse large B-cell lymphoma, unspecified site: Secondary | ICD-10-CM | POA: Diagnosis not present

## 2018-08-24 DIAGNOSIS — J449 Chronic obstructive pulmonary disease, unspecified: Secondary | ICD-10-CM | POA: Diagnosis not present

## 2018-08-24 DIAGNOSIS — G2581 Restless legs syndrome: Secondary | ICD-10-CM | POA: Diagnosis not present

## 2018-08-24 DIAGNOSIS — Z9181 History of falling: Secondary | ICD-10-CM | POA: Diagnosis not present

## 2018-08-24 DIAGNOSIS — K5900 Constipation, unspecified: Secondary | ICD-10-CM | POA: Diagnosis not present

## 2018-08-24 DIAGNOSIS — I129 Hypertensive chronic kidney disease with stage 1 through stage 4 chronic kidney disease, or unspecified chronic kidney disease: Secondary | ICD-10-CM | POA: Diagnosis not present

## 2018-08-24 DIAGNOSIS — E785 Hyperlipidemia, unspecified: Secondary | ICD-10-CM | POA: Diagnosis not present

## 2018-08-24 DIAGNOSIS — Z7982 Long term (current) use of aspirin: Secondary | ICD-10-CM | POA: Diagnosis not present

## 2018-08-24 DIAGNOSIS — N183 Chronic kidney disease, stage 3 (moderate): Secondary | ICD-10-CM | POA: Diagnosis not present

## 2018-08-26 DIAGNOSIS — E785 Hyperlipidemia, unspecified: Secondary | ICD-10-CM | POA: Diagnosis not present

## 2018-08-26 DIAGNOSIS — Z7982 Long term (current) use of aspirin: Secondary | ICD-10-CM | POA: Diagnosis not present

## 2018-08-26 DIAGNOSIS — J449 Chronic obstructive pulmonary disease, unspecified: Secondary | ICD-10-CM | POA: Diagnosis not present

## 2018-08-26 DIAGNOSIS — K5900 Constipation, unspecified: Secondary | ICD-10-CM | POA: Diagnosis not present

## 2018-08-26 DIAGNOSIS — M1990 Unspecified osteoarthritis, unspecified site: Secondary | ICD-10-CM | POA: Diagnosis not present

## 2018-08-26 DIAGNOSIS — E860 Dehydration: Secondary | ICD-10-CM | POA: Diagnosis not present

## 2018-08-26 DIAGNOSIS — C8330 Diffuse large B-cell lymphoma, unspecified site: Secondary | ICD-10-CM | POA: Diagnosis not present

## 2018-08-26 DIAGNOSIS — I129 Hypertensive chronic kidney disease with stage 1 through stage 4 chronic kidney disease, or unspecified chronic kidney disease: Secondary | ICD-10-CM | POA: Diagnosis not present

## 2018-08-26 DIAGNOSIS — Z85038 Personal history of other malignant neoplasm of large intestine: Secondary | ICD-10-CM | POA: Diagnosis not present

## 2018-08-26 DIAGNOSIS — D631 Anemia in chronic kidney disease: Secondary | ICD-10-CM | POA: Diagnosis not present

## 2018-08-26 DIAGNOSIS — N183 Chronic kidney disease, stage 3 (moderate): Secondary | ICD-10-CM | POA: Diagnosis not present

## 2018-08-26 DIAGNOSIS — G2581 Restless legs syndrome: Secondary | ICD-10-CM | POA: Diagnosis not present

## 2018-08-26 DIAGNOSIS — Z87891 Personal history of nicotine dependence: Secondary | ICD-10-CM | POA: Diagnosis not present

## 2018-08-26 DIAGNOSIS — I951 Orthostatic hypotension: Secondary | ICD-10-CM | POA: Diagnosis not present

## 2018-08-26 DIAGNOSIS — K219 Gastro-esophageal reflux disease without esophagitis: Secondary | ICD-10-CM | POA: Diagnosis not present

## 2018-08-26 DIAGNOSIS — Z9181 History of falling: Secondary | ICD-10-CM | POA: Diagnosis not present

## 2018-08-28 ENCOUNTER — Encounter (HOSPITAL_COMMUNITY): Payer: Self-pay | Admitting: Emergency Medicine

## 2018-08-28 ENCOUNTER — Emergency Department (HOSPITAL_COMMUNITY)
Admission: EM | Admit: 2018-08-28 | Discharge: 2018-08-28 | Disposition: A | Discharge status: Home / Self Care | Payer: Medicare Other | Attending: Emergency Medicine | Admitting: Emergency Medicine

## 2018-08-28 ENCOUNTER — Emergency Department (HOSPITAL_COMMUNITY): Payer: Medicare Other

## 2018-08-28 DIAGNOSIS — Z79899 Other long term (current) drug therapy: Secondary | ICD-10-CM | POA: Diagnosis not present

## 2018-08-28 DIAGNOSIS — R0689 Other abnormalities of breathing: Secondary | ICD-10-CM | POA: Diagnosis not present

## 2018-08-28 DIAGNOSIS — C8590 Non-Hodgkin lymphoma, unspecified, unspecified site: Secondary | ICD-10-CM | POA: Diagnosis not present

## 2018-08-28 DIAGNOSIS — Z85828 Personal history of other malignant neoplasm of skin: Secondary | ICD-10-CM | POA: Diagnosis not present

## 2018-08-28 DIAGNOSIS — F329 Major depressive disorder, single episode, unspecified: Secondary | ICD-10-CM | POA: Insufficient documentation

## 2018-08-28 DIAGNOSIS — Z85038 Personal history of other malignant neoplasm of large intestine: Secondary | ICD-10-CM | POA: Diagnosis not present

## 2018-08-28 DIAGNOSIS — S299XXA Unspecified injury of thorax, initial encounter: Secondary | ICD-10-CM | POA: Diagnosis not present

## 2018-08-28 DIAGNOSIS — N183 Chronic kidney disease, stage 3 (moderate): Secondary | ICD-10-CM | POA: Diagnosis not present

## 2018-08-28 DIAGNOSIS — Z9049 Acquired absence of other specified parts of digestive tract: Secondary | ICD-10-CM | POA: Diagnosis not present

## 2018-08-28 DIAGNOSIS — R000 Tachycardia, unspecified: Secondary | ICD-10-CM | POA: Diagnosis not present

## 2018-08-28 DIAGNOSIS — Z87891 Personal history of nicotine dependence: Secondary | ICD-10-CM | POA: Insufficient documentation

## 2018-08-28 DIAGNOSIS — I129 Hypertensive chronic kidney disease with stage 1 through stage 4 chronic kidney disease, or unspecified chronic kidney disease: Secondary | ICD-10-CM | POA: Insufficient documentation

## 2018-08-28 DIAGNOSIS — R110 Nausea: Secondary | ICD-10-CM | POA: Diagnosis not present

## 2018-08-28 DIAGNOSIS — Z7982 Long term (current) use of aspirin: Secondary | ICD-10-CM | POA: Diagnosis not present

## 2018-08-28 DIAGNOSIS — R531 Weakness: Secondary | ICD-10-CM | POA: Diagnosis not present

## 2018-08-28 DIAGNOSIS — D709 Neutropenia, unspecified: Secondary | ICD-10-CM | POA: Diagnosis not present

## 2018-08-28 DIAGNOSIS — W19XXXA Unspecified fall, initial encounter: Secondary | ICD-10-CM | POA: Diagnosis not present

## 2018-08-28 DIAGNOSIS — R5381 Other malaise: Secondary | ICD-10-CM | POA: Diagnosis not present

## 2018-08-28 DIAGNOSIS — R5383 Other fatigue: Secondary | ICD-10-CM | POA: Diagnosis not present

## 2018-08-28 DIAGNOSIS — J449 Chronic obstructive pulmonary disease, unspecified: Secondary | ICD-10-CM | POA: Diagnosis not present

## 2018-08-28 DIAGNOSIS — R0902 Hypoxemia: Secondary | ICD-10-CM | POA: Diagnosis not present

## 2018-08-28 LAB — BASIC METABOLIC PANEL
Anion gap: 8 (ref 5–15)
BUN: 24 mg/dL — ABNORMAL HIGH (ref 8–23)
CHLORIDE: 105 mmol/L (ref 98–111)
CO2: 26 mmol/L (ref 22–32)
CREATININE: 0.83 mg/dL (ref 0.44–1.00)
Calcium: 8 mg/dL — ABNORMAL LOW (ref 8.9–10.3)
GFR calc Af Amer: >60 mL/min (ref >60)
GFR calc non Af Amer: >60 mL/min (ref >60)
GLUCOSE: 101 mg/dL — AB (ref 70–99)
Potassium: 3.6 mmol/L (ref 3.5–5.1)
Sodium: 139 mmol/L (ref 135–145)

## 2018-08-28 LAB — URINALYSIS, ROUTINE W REFLEX MICROSCOPIC
BILIRUBIN URINE: NEGATIVE
GLUCOSE, UA: NEGATIVE mg/dL
HGB URINE DIPSTICK: NEGATIVE
Ketones, ur: NEGATIVE mg/dL
Leukocytes, UA: NEGATIVE
Nitrite: NEGATIVE
PH: 5 (ref 5.0–8.0)
Protein, ur: NEGATIVE mg/dL
Specific Gravity, Urine: 1.016 (ref 1.005–1.030)

## 2018-08-28 MED ORDER — SODIUM CHLORIDE 0.9 % IV BOLUS
1000.0000 mL | Freq: Once | INTRAVENOUS | Status: AC
  Administered 2018-08-28: 1000 mL via INTRAVENOUS

## 2018-08-28 MED ORDER — SODIUM CHLORIDE 0.9 % IV SOLN
INTRAVENOUS | Status: DC
  Administered 2018-08-28: 09:00:00 via INTRAVENOUS

## 2018-08-28 MED ORDER — SODIUM CHLORIDE 0.9 % IV BOLUS
500.0000 mL | Freq: Once | INTRAVENOUS | Status: AC
  Administered 2018-08-28: 500 mL via INTRAVENOUS

## 2018-08-28 MED ORDER — ONDANSETRON HCL 4 MG/2ML IJ SOLN: 4.0000 mg | Freq: Once | INTRAMUSCULAR | Status: DC

## 2018-08-28 MED ORDER — BOOST PLUS PO LIQD
237.0000 mL | Freq: Three times a day (TID) | ORAL | Status: DC
  Administered 2018-08-28: 237 mL via ORAL
  Filled 2018-08-28: qty 237

## 2018-08-28 MED ORDER — ONDANSETRON HCL 4 MG/2ML IJ SOLN
4.0000 mg | Freq: Once | INTRAMUSCULAR | Status: AC
  Administered 2018-08-28: 4 mg via INTRAVENOUS
  Filled 2018-08-28: qty 2

## 2018-08-28 NOTE — ED Notes (Signed)
Patient unable to stand more than a minute. Legs became weak, unable to ambulate at this time. MD was at bedside.

## 2018-08-28 NOTE — ED Notes (Signed)
Date and time results received: 08/28/18, 0958  Test :WBC Critical Value: 0.2  Name of Provider Notified: Eulis Foster, MD  Orders Received? Or Actions Taken?:

## 2018-08-28 NOTE — ED Provider Notes (Signed)
[facility] DEPT Provider Note   CSN: [id number] Arrival date & time: 08/28/18  0536     History   Chief Complaint Chief Complaint  Patient presents with  . Fall  . Fatigue    HPI Erica Escobar is a 82 y.o. female.  HPI   Patient fell around 1 AM this morning when she slipped out of bed.  She apparently laid in floor until EMS brought her here.  Her son is here and states that she was doing well yesterday he was with her when she ate breakfast.  She took her medications yesterday but has not had any this morning.  She is undergoing chemotherapy for lymphoma, last infusion was about a week ago.  She complains of generalized malaise and weakness while on infusions.  Her appetite has been fair.  She denies dysuria or constipation.  She has not had a fever, chills, cough, nausea or vomiting.  There are no other known modifying factors.  Past Medical History:  Diagnosis Date  . Abnormal tympanic membrane    right ear and mastoid problems  . Arthritis    oseoarthritis  . Cancer of ascending colon ([place]) 02/15/2015  . Constipation, chronic   . COPD (chronic obstructive pulmonary disease) (HCC)    mild COPD  . Depression   . Diverticulosis   . GERD (gastroesophageal reflux disease)   . Glaucoma    bilateral-sugery with laser, using eye drops  . Hemorrhoids    Internal  . History of hiatal hernia    dx. '87 Schatzki's ring  . Hyperlipidemia    good control  . Hypertension   . Insomnia    hx. of  . Keratosis, actinic   . Leg cramps   . Macular degeneration   . Neuromuscular disorder (HCC)    neuropathy legs,   . On aspirin at home    chronic use  . PONV (postoperative nausea and vomiting)   . Restless leg syndrome   . Schatzki's ring 1987  . Skin cancer    multiple, "tx. area right anterior wrist"  . Urinary incontinence    occ, stress    Patient Active Problem List   Diagnosis Date Noted  . Acute kidney injury ([place]) 08/14/2018  .  Dehydration 08/14/2018  . FTT (failure to thrive) in adult 08/14/2018  . Pressure injury of skin 08/14/2018  . Acute back pain 07/23/2018  . CKD (chronic kidney disease), stage III ([place]) 07/23/2018  . Anorexia 07/23/2018  . Hyperlipidemia 07/23/2018  . Large cell lymphoma ([place]) 07/20/2018  . Metastatic cancer ([place]) 07/12/2018  . Chronic back pain 07/12/2018  . Pathologic rib fracture, initial encounter 07/12/2018  . Chest wall mass 07/12/2018  . Cancer of ascending colon s/p robotic colectomy 02/15/2015 02/15/2015  . Restless leg syndrome   . Depression   . Insomnia   . COPD (chronic obstructive pulmonary disease) ([place])   . GERD (gastroesophageal reflux disease)     Past Surgical History:  Procedure Laterality Date  . ABDOMINAL HYSTERECTOMY    . BLADDER REPAIR    . CARDIAC CATHETERIZATION  2007, 2009  . CATARACT EXTRACTION Bilateral   . CHOLECYSTECTOMY    . COLECTOMY    . FINGER NAIL SURGERY    . FOOT SURGERY Right   . HEMORROIDECTOMY    . INNER EAR SURGERY Right    3-4 times  . INTRAOPERATIVE ARTERIOGRAM     right arm  . KNEE SURGERY Right   . LEG SURGERY  Bilateral    laser ablation  . MASTOID DEBRIDEMENT     x2  . SKIN CANCER EXCISION     right/left thumb nail, left face, right ear/neck, right leg  . TOE FUSION    . TONSILLECTOMY       OB History   None      Home Medications    Prior to Admission medications   Medication Sig Start Date End Date Taking? Authorizing Provider  aspirin EC 81 MG tablet Take 81 mg by mouth every morning.   Yes [provider]  ENSURE (ENSURE) Take 237 mLs by mouth 2 (two) times daily between meals. VANILLA OR STRAWBERRY IF AVAILABLE PO DAILY    Yes [provider]  gemfibrozil (LOPID) 600 MG tablet Take 600 mg by mouth every morning.   Yes [provider]  loratadine (CLARITIN) 10 MG tablet Take 10 mg by mouth daily as needed for allergies.   Yes [provider]  mirtazapine (REMERON) 15 MG  tablet Take 7.5 mg by mouth at bedtime. 06/29/18  Yes [provider]  multivitamin-lutein (OCUVITE-LUTEIN) CAPS capsule Take 1 capsule by mouth daily.   Yes [provider]  Omega-3 Fatty Acids (FISH OIL) 1000 MG CAPS Take 2,000 mg by mouth daily with breakfast.    Yes [provider]  pantoprazole (PROTONIX) 40 MG tablet Take 40 mg by mouth daily. 03/09/17  Yes [provider]  polyethylene glycol (MIRALAX / GLYCOLAX) packet Take 17 g by mouth daily as needed for moderate constipation.   Yes [provider]  Polyvinyl Alcohol-Povidone PF (REFRESH) 1.4-0.6 % SOLN Place 6 drops into both eyes daily.   Yes [provider]  potassium chloride (K-DUR) 10 MEQ tablet Take 10 mEq by mouth daily.  06/08/18  Yes [provider]  promethazine (PHENERGAN) 25 MG tablet Take 0.5 tablets (12.5 mg total) by mouth every 8 (eight) hours as needed for nausea or vomiting. 08/18/18  Yes Tanner, Lyndon Code., PA-C  trolamine salicylate (ASPERCREME) 10 % cream Apply 1 application topically as needed for muscle pain (as needed for back pain).   Yes [provider]  ULORIC 40 MG tablet Take 40 mg by mouth daily.  03/16/18  Yes [provider]  vitamin B-12 (CYANOCOBALAMIN) 1000 MCG tablet Take 1,000 mcg by mouth daily.   Yes [provider]    Family History History reviewed. No pertinent family history.  Social History Social History   Tobacco Use  . Smoking status: Former Research scientist (life sciences)  . Smokeless tobacco: Never Used  . Tobacco comment: short time x1 - during pregnancy  Substance Use Topics  . Alcohol use: No    Alcohol/week: 0.0 standard drinks  . Drug use: No     Allergies   Azithromycin; Allopurinol; Cephalexin; Codeine; Gabapentin; Niaspan [niacin]; Oxybutynin; Paxil [paroxetine hcl]; Penicillins; Pentazocine; Tizanidine; Tramadol; and Vicodin [hydrocodone-acetaminophen]   Review of Systems Review of Systems  All other systems  reviewed and are negative.    Physical Exam Updated Vital Signs BP 136/67   Pulse (!) 102   Temp 99.1 F (37.3 C) (Oral)   Resp 16   SpO2 100%   Physical Exam  Constitutional: She is oriented to person, place, and time. She appears well-developed. No distress.  Elderly, frail  HENT:  Head: Normocephalic and atraumatic.  Eyes: Pupils are equal, round, and reactive to light. Conjunctivae and EOM are normal.  Neck: Normal range of motion and phonation normal. Neck supple.  Cardiovascular: Normal rate  and regular rhythm.  Pulmonary/Chest: Effort normal and breath sounds normal. She exhibits no tenderness.  Abdominal: Soft. She exhibits no distension. There is no tenderness. There is no guarding.  Musculoskeletal: Normal range of motion.  Neurological: She is alert and oriented to person, place, and time. She exhibits normal muscle tone.  No dysarthria or aphasia.  Alert and conversant.  Skin: Skin is warm and dry.  Psychiatric: She has a normal mood and affect. Her behavior is normal. Judgment and thought content normal.  Nursing note and vitals reviewed.    ED Treatments / Results  Labs (all labs ordered are listed, but only abnormal results are displayed) Labs Reviewed  BASIC METABOLIC PANEL - Abnormal; Notable for the following components:      Result Value   Glucose, Bld 101 (*)    BUN 24 (*)    Calcium 8.0 (*)    All other components within normal limits  CBC WITH DIFFERENTIAL/PLATELET - Abnormal; Notable for the following components:   WBC 0.2 (*)    RBC 2.69 (*)    Hemoglobin 8.3 (*)    HCT 24.0 (*)    RDW 16.1 (*)    Platelets 104 (*)    Neutro Abs 0.0 (*)    Lymphs Abs 0.2 (*)    Monocytes Absolute 0.0 (*)    All other components within normal limits  URINALYSIS, ROUTINE W REFLEX MICROSCOPIC - Abnormal; Notable for the following components:   APPearance HAZY (*)    All other components within normal limits    EKG None  Radiology Dg Chest Port 1  View  Result Date: 08/28/2018 CLINICAL DATA:  Fall this morning, increasing weakness. History of lymphoma. EXAM: PORTABLE CHEST 1 VIEW COMPARISON:  Chest x-rays dated 08/14/2018 and 07/12/2018. FINDINGS: Heart size and mediastinal contours are stable. Lungs are clear. No pleural effusion or pneumothorax seen. RIGHT-sided PICC line appears appropriately position with tip at the level of the lower SVC. Osseous structures about the chest are unremarkable. IMPRESSION: No acute findings. No evidence of pneumonia or pulmonary edema. No osseous fracture or dislocation seen. Electronically Signed   By: Franki Cabot M.D.   On: 08/28/2018 11:35    Procedures Procedures (including critical care time)  Medications Ordered in ED Medications  0.9 %  sodium chloride infusion ( Intravenous Restarted 08/28/18 0843)  ondansetron (ZOFRAN) injection 4 mg (4 mg Intravenous Not Given 08/28/18 1303)  lactose free nutrition (BOOST PLUS) liquid 237 mL (237 mLs Oral Given 08/28/18 1317)  sodium chloride 0.9 % bolus 500 mL (0 mLs Intravenous Stopped 08/28/18 1003)  sodium chloride 0.9 % bolus 1,000 mL (0 mLs Intravenous Stopped 08/28/18 1259)  ondansetron (ZOFRAN) injection 4 mg (4 mg Intravenous Given 08/28/18 1258)     Initial Impression / Assessment and Plan / ED Course  I have reviewed the triage vital signs and the nursing notes.  Pertinent labs & imaging results that were available during my care of the patient were reviewed by me and considered in my medical decision making (see chart for details).  Clinical Course as of Aug 28 1333  Sun Aug 28, 2018  1109 Attempt Orthostatics and ambulation trial, patient was unable to stand for orthostatics because "my legs are weak."  Unable to ambulate the patient due to leg weakness.  I updated the patient's son on the current findings.   [EW]  1109 Abnormal, leukopenia with neutropenia.  Hemoglobin low, hematocrit low, platelets low, normal differential  CBC with  Differential(!!) [EW]  1110 Normal except BUN high, calcium low  Basic metabolic panel(!) [EW]  3875 Normal  Urinalysis, Routine w reflex microscopic(!) [EW]  1219 No infiltrate or CHF, images reviewed by me  DG Chest Port 1 View [EW]  1225 Case discussed with oncologist, Dr. Earlie Server.  Patient received Neulasta, at her last infusion therefore does not require additional treatment at this time.  He would not give antibiotics since she does not have a picture consistent with bacterial infection.   [EW]  1326 Patient has not been able to drink nutritional supplements.  Patient expressed to nursing that she was not eating because she did not want to live any longer.  I discussed the situation with her son who states that he has not heard her say this previously.  Patient is not currently having active suicidal ideation or plan.  Patient recently had initiation of in-home health services with advanced home care.  It is unclear the extent of services that she is receiving therefore, will order comprehensive in-home health services, to hopefully improve her situation.  Patient may require placement in an assisted living facility.  She has a primary care doctor to help her with this process.   [EW]    Clinical Course User Index [EW] Daleen Bo, MD     Patient Vitals for the past 24 hrs:  BP Temp Temp src Pulse Resp SpO2  08/28/18 1300 136/67 - - (!) 102 16 100 %  08/28/18 1230 (!) 160/52 - - 97 16 100 %  08/28/18 1200 140/67 - - 95 18 99 %  08/28/18 1130 118/65 - - 97 18 99 %  08/28/18 1100 (!) 112/59 - - 100 - 98 %  08/28/18 1030 (!) 141/65 - - 95 18 99 %  08/28/18 1000 139/61 - - 92 - 96 %  08/28/18 0930 128/65 - - 97 18 98 %  08/28/18 0802 (!) 115/100 99.1 F (37.3 C) Oral 97 17 98 %  08/28/18 0543 - - - - - 98 %  08/28/18 0542 131/69 99.2 F (37.3 C) Oral (!) 105 19 -    1:21 PM Reevaluation with update and discussion. After initial assessment and treatment, an updated evaluation  reveals patient is somewhat more alert and bright, findings discussed and questions answered with patient and family member. Daleen Bo   Medical Decision Making: Malaise with weakness and poor nutrition.  Current active treatment for lymphoma.  Neutropenia, following chemo, within the window of expected leukocyte suppression, without current infection, that would require antibiotics and/or hospitalization.  Suspect infection, metabolic instability or impending vascular collapse.  CRITICAL CARE-no Performed by: Daleen Bo   Nursing Notes Reviewed/ Care Coordinated Applicable Imaging Reviewed Interpretation of Laboratory Data incorporated into ED treatment  The patient appears reasonably screened and/or stabilized for discharge and I doubt any other medical condition or other [facility] requiring further screening, evaluation, or treatment in the ED at this time prior to discharge.  Plan: Home Medications-continue home medications, increase oral nutrition and hydration; Home Treatments-rest, fluids; return here if the recommended treatment, does not improve the symptoms; Recommended follow up-PCP as soon as possible in-home health services for evaluation hopefully tomorrow.   Final Clinical Impressions(s) / ED Diagnoses   Final diagnoses:  Malaise and fatigue  Weakness  Lymphoma, unspecified body region, unspecified lymphoma type ([place])  Neutropenia, unspecified type [facility])    ED Discharge Orders         [place]     08/28/18 1332  Face-to-face encounter (required for Medicare/Medicaid patients)    Comments:  I Daleen Bo certify that this patient is under my care and that I, or a nurse practitioner or physician's assistant working with me, had a face-to-face encounter that meets the physician face-to-face encounter requirements with this patient on 08/28/2018. The encounter with the patient was in whole, or in part for the following medical condition(s) which is the  primary reason for home health care (List medical condition): weakness, poor appitite   08/28/18 1332           Daleen Bo, MD 08/28/18 1334

## 2018-08-28 NOTE — ED Notes (Signed)
Pt. Was sipping on ginger ale for the fluid challenge. Nurse aware.

## 2018-08-28 NOTE — ED Notes (Signed)
Pt. Labs were unsuccessful. Nurse aware.

## 2018-08-28 NOTE — ED Notes (Signed)
Bed: OL07 Expected date:  Expected time:  Means of arrival:  Comments: 39F cancer patient increased lethargy/ fall

## 2018-08-28 NOTE — ED Notes (Signed)
RN went in to try and have a therapeutic talk with the patient. RN informed pt that if she was to continue not eating she would continue to become weaker, have more falls, and potentially die. Pt stated to RN "I feel like I can't eat. I just want to sleep all the time." RN asked pt what she wanted her treatment to be. Pt reported to RN "I just hurt all over and I don't want to hurt. I have lymphoma and I think I would be better off if the good Lord would just take me." RN told pt it was unfortunate she felt that way and asked if there was anything she could do to help.  Pt told RN "I don't know. Just let me rest."

## 2018-08-28 NOTE — Discharge Instructions (Signed)
It is very important to eat and drink as much as possible.  Try to use your Ensure protein shakes 2 or 3 times a day to improve your nutritional status.  Try to drink 1 to 2 L of water every day.  We are going to have home health to come to your home to evaluate and treat you further.  Call your PCP for a follow-up appointment to be seen as soon as possible.  He may be able to help you get increased treatment in a facility such as assisted living.

## 2018-08-28 NOTE — ED Notes (Signed)
Pt given lunch tray. Pt currently attempting to eat some of the mac and cheese, and reports she will also try and eat some of the ice cream.

## 2018-08-28 NOTE — ED Notes (Signed)
While being discharge out to the car pt was upset due to the fact that she didn't want to leave. Pt. Had soiled the brief and was  changed x3. Pt. Was given a chuck to place in the seat in the car.  Pt. C/o that she wasn't getting the care while here at the hospital. Pt. Stated she couldn't take care of herself while returning back home. Nurses are aware.

## 2018-08-28 NOTE — ED Notes (Signed)
MD made aware of conversation RN had with pt.

## 2018-08-28 NOTE — ED Triage Notes (Signed)
Per EMS , pt. From home with complaint of fall at around 0345 am today while going to the bathroom, pt. Denied hitting head nor LOC,alert and oriented x4. Pt. Also reported of increasing weakness for several weeks now. Pt has Lymphoma. Denied fever, denied chest pain nor SOB.

## 2018-08-29 ENCOUNTER — Other Ambulatory Visit: Payer: Self-pay

## 2018-08-29 ENCOUNTER — Inpatient Hospital Stay (HOSPITAL_COMMUNITY)
Admission: EM | Admit: 2018-08-29 | Discharge: 2018-09-02 | DRG: 812 | Disposition: A | Discharge status: Home Health | Payer: Medicare Other | Attending: Family Medicine | Admitting: Family Medicine

## 2018-08-29 ENCOUNTER — Observation Stay (HOSPITAL_COMMUNITY): Payer: Medicare Other

## 2018-08-29 ENCOUNTER — Telehealth: Payer: Self-pay | Admitting: *Deleted

## 2018-08-29 ENCOUNTER — Encounter (HOSPITAL_COMMUNITY): Payer: Self-pay | Admitting: *Deleted

## 2018-08-29 ENCOUNTER — Emergency Department (HOSPITAL_COMMUNITY): Payer: Medicare Other

## 2018-08-29 DIAGNOSIS — Z888 Allergy status to other drugs, medicaments and biological substances status: Secondary | ICD-10-CM

## 2018-08-29 DIAGNOSIS — R627 Adult failure to thrive: Secondary | ICD-10-CM | POA: Diagnosis not present

## 2018-08-29 DIAGNOSIS — K5730 Diverticulosis of large intestine without perforation or abscess without bleeding: Secondary | ICD-10-CM | POA: Diagnosis not present

## 2018-08-29 DIAGNOSIS — C182 Malignant neoplasm of ascending colon: Secondary | ICD-10-CM | POA: Diagnosis present

## 2018-08-29 DIAGNOSIS — G629 Polyneuropathy, unspecified: Secondary | ICD-10-CM | POA: Diagnosis present

## 2018-08-29 DIAGNOSIS — E860 Dehydration: Secondary | ICD-10-CM | POA: Diagnosis present

## 2018-08-29 DIAGNOSIS — Z885 Allergy status to narcotic agent status: Secondary | ICD-10-CM

## 2018-08-29 DIAGNOSIS — R531 Weakness: Secondary | ICD-10-CM

## 2018-08-29 DIAGNOSIS — Z880 Allergy status to penicillin: Secondary | ICD-10-CM

## 2018-08-29 DIAGNOSIS — Z79899 Other long term (current) drug therapy: Secondary | ICD-10-CM | POA: Diagnosis not present

## 2018-08-29 DIAGNOSIS — R1030 Lower abdominal pain, unspecified: Secondary | ICD-10-CM | POA: Diagnosis not present

## 2018-08-29 DIAGNOSIS — R110 Nausea: Secondary | ICD-10-CM | POA: Diagnosis not present

## 2018-08-29 DIAGNOSIS — M1990 Unspecified osteoarthritis, unspecified site: Secondary | ICD-10-CM | POA: Diagnosis not present

## 2018-08-29 DIAGNOSIS — N179 Acute kidney failure, unspecified: Secondary | ICD-10-CM | POA: Diagnosis not present

## 2018-08-29 DIAGNOSIS — Z7982 Long term (current) use of aspirin: Secondary | ICD-10-CM | POA: Diagnosis not present

## 2018-08-29 DIAGNOSIS — K648 Other hemorrhoids: Secondary | ICD-10-CM | POA: Diagnosis not present

## 2018-08-29 DIAGNOSIS — R197 Diarrhea, unspecified: Secondary | ICD-10-CM

## 2018-08-29 DIAGNOSIS — K921 Melena: Secondary | ICD-10-CM | POA: Diagnosis not present

## 2018-08-29 DIAGNOSIS — E785 Hyperlipidemia, unspecified: Secondary | ICD-10-CM | POA: Diagnosis not present

## 2018-08-29 DIAGNOSIS — C8330 Diffuse large B-cell lymphoma, unspecified site: Secondary | ICD-10-CM | POA: Diagnosis present

## 2018-08-29 DIAGNOSIS — T451X5A Adverse effect of antineoplastic and immunosuppressive drugs, initial encounter: Secondary | ICD-10-CM | POA: Diagnosis present

## 2018-08-29 DIAGNOSIS — J449 Chronic obstructive pulmonary disease, unspecified: Secondary | ICD-10-CM | POA: Diagnosis not present

## 2018-08-29 DIAGNOSIS — Z9049 Acquired absence of other specified parts of digestive tract: Secondary | ICD-10-CM | POA: Diagnosis not present

## 2018-08-29 DIAGNOSIS — I129 Hypertensive chronic kidney disease with stage 1 through stage 4 chronic kidney disease, or unspecified chronic kidney disease: Secondary | ICD-10-CM | POA: Diagnosis present

## 2018-08-29 DIAGNOSIS — Z85038 Personal history of other malignant neoplasm of large intestine: Secondary | ICD-10-CM

## 2018-08-29 DIAGNOSIS — N183 Chronic kidney disease, stage 3 unspecified: Secondary | ICD-10-CM | POA: Diagnosis present

## 2018-08-29 DIAGNOSIS — I959 Hypotension, unspecified: Secondary | ICD-10-CM | POA: Diagnosis not present

## 2018-08-29 DIAGNOSIS — D638 Anemia in other chronic diseases classified elsewhere: Secondary | ICD-10-CM | POA: Diagnosis present

## 2018-08-29 DIAGNOSIS — R748 Abnormal levels of other serum enzymes: Secondary | ICD-10-CM

## 2018-08-29 DIAGNOSIS — Z881 Allergy status to other antibiotic agents status: Secondary | ICD-10-CM

## 2018-08-29 DIAGNOSIS — I951 Orthostatic hypotension: Secondary | ICD-10-CM | POA: Diagnosis present

## 2018-08-29 DIAGNOSIS — Z87891 Personal history of nicotine dependence: Secondary | ICD-10-CM | POA: Diagnosis not present

## 2018-08-29 DIAGNOSIS — C8580 Other specified types of non-Hodgkin lymphoma, unspecified site: Secondary | ICD-10-CM | POA: Diagnosis present

## 2018-08-29 DIAGNOSIS — J410 Simple chronic bronchitis: Secondary | ICD-10-CM | POA: Diagnosis not present

## 2018-08-29 DIAGNOSIS — K219 Gastro-esophageal reflux disease without esophagitis: Secondary | ICD-10-CM | POA: Diagnosis not present

## 2018-08-29 DIAGNOSIS — D631 Anemia in chronic kidney disease: Secondary | ICD-10-CM | POA: Diagnosis not present

## 2018-08-29 DIAGNOSIS — Z85828 Personal history of other malignant neoplasm of skin: Secondary | ICD-10-CM | POA: Diagnosis not present

## 2018-08-29 DIAGNOSIS — G2581 Restless legs syndrome: Secondary | ICD-10-CM | POA: Diagnosis not present

## 2018-08-29 DIAGNOSIS — D6481 Anemia due to antineoplastic chemotherapy: Secondary | ICD-10-CM | POA: Diagnosis not present

## 2018-08-29 DIAGNOSIS — E870 Hyperosmolality and hypernatremia: Secondary | ICD-10-CM | POA: Diagnosis not present

## 2018-08-29 DIAGNOSIS — S0990XA Unspecified injury of head, initial encounter: Secondary | ICD-10-CM | POA: Diagnosis not present

## 2018-08-29 DIAGNOSIS — Z66 Do not resuscitate: Secondary | ICD-10-CM | POA: Diagnosis not present

## 2018-08-29 DIAGNOSIS — R109 Unspecified abdominal pain: Secondary | ICD-10-CM

## 2018-08-29 DIAGNOSIS — E440 Moderate protein-calorie malnutrition: Secondary | ICD-10-CM | POA: Diagnosis present

## 2018-08-29 DIAGNOSIS — H409 Unspecified glaucoma: Secondary | ICD-10-CM | POA: Diagnosis present

## 2018-08-29 DIAGNOSIS — H3530 Unspecified macular degeneration: Secondary | ICD-10-CM | POA: Diagnosis present

## 2018-08-29 DIAGNOSIS — Z6826 Body mass index (BMI) 26.0-26.9, adult: Secondary | ICD-10-CM

## 2018-08-29 DIAGNOSIS — D649 Anemia, unspecified: Secondary | ICD-10-CM | POA: Diagnosis not present

## 2018-08-29 DIAGNOSIS — Z9181 History of falling: Secondary | ICD-10-CM | POA: Diagnosis not present

## 2018-08-29 DIAGNOSIS — R296 Repeated falls: Secondary | ICD-10-CM | POA: Diagnosis present

## 2018-08-29 DIAGNOSIS — R079 Chest pain, unspecified: Secondary | ICD-10-CM | POA: Diagnosis not present

## 2018-08-29 DIAGNOSIS — K449 Diaphragmatic hernia without obstruction or gangrene: Secondary | ICD-10-CM | POA: Diagnosis present

## 2018-08-29 DIAGNOSIS — K5641 Fecal impaction: Secondary | ICD-10-CM | POA: Diagnosis present

## 2018-08-29 DIAGNOSIS — D701 Agranulocytosis secondary to cancer chemotherapy: Secondary | ICD-10-CM | POA: Diagnosis present

## 2018-08-29 DIAGNOSIS — R5383 Other fatigue: Secondary | ICD-10-CM | POA: Diagnosis not present

## 2018-08-29 DIAGNOSIS — S199XXA Unspecified injury of neck, initial encounter: Secondary | ICD-10-CM | POA: Diagnosis not present

## 2018-08-29 DIAGNOSIS — K5900 Constipation, unspecified: Secondary | ICD-10-CM | POA: Diagnosis not present

## 2018-08-29 LAB — CBC WITH DIFFERENTIAL/PLATELET
Basophils Absolute: 0 10*3/uL (ref 0.0–0.1)
Basophils Relative: 1 %
Eosinophils Absolute: 0 10*3/uL (ref 0.0–0.7)
Eosinophils Relative: 1 %
HCT: 15.9 % — ABNORMAL LOW (ref 36.0–46.0)
Hemoglobin: 5.5 g/dL — CL (ref 12.0–15.0)
Lymphocytes Relative: 27 %
Lymphs Abs: 0.3 10*3/uL — ABNORMAL LOW (ref 0.7–4.0)
MCH: 30.7 pg (ref 26.0–34.0)
MCHC: 34.6 g/dL (ref 30.0–36.0)
MCV: 88.8 fL (ref 78.0–100.0)
Monocytes Absolute: 0.4 10*3/uL (ref 0.1–1.0)
Monocytes Relative: 37 %
Neutro Abs: 0.3 10*3/uL — ABNORMAL LOW (ref 1.7–7.7)
Neutrophils Relative %: 34 %
Platelets: 121 10*3/uL — ABNORMAL LOW (ref 150–400)
RBC: 1.79 MIL/uL — ABNORMAL LOW (ref 3.87–5.11)
RDW: 16.1 % — ABNORMAL HIGH (ref 11.5–15.5)
WBC: 1 10*3/uL — CL (ref 4.0–10.5)

## 2018-08-29 LAB — IRON AND TIBC
Iron: 34 ug/dL (ref 28–170)
Saturation Ratios: 19 % (ref 10.4–31.8)
TIBC: 182 ug/dL — ABNORMAL LOW (ref 250–450)
UIBC: 148 ug/dL

## 2018-08-29 LAB — PREPARE RBC (CROSSMATCH)

## 2018-08-29 LAB — BASIC METABOLIC PANEL
Anion gap: 7 (ref 5–15)
BUN: 13 mg/dL (ref 8–23)
CO2: 27 mmol/L (ref 22–32)
Calcium: 8 mg/dL — ABNORMAL LOW (ref 8.9–10.3)
Chloride: 105 mmol/L (ref 98–111)
Creatinine, Ser: 0.76 mg/dL (ref 0.44–1.00)
GFR calc Af Amer: >60 mL/min (ref >60)
GFR calc non Af Amer: >60 mL/min (ref >60)
Glucose, Bld: 85 mg/dL (ref 70–99)
Potassium: 3.6 mmol/L (ref 3.5–5.1)
Sodium: 139 mmol/L (ref 135–145)

## 2018-08-29 LAB — TROPONIN I: Troponin I: 0.03 ng/mL (ref <0.03)

## 2018-08-29 LAB — FOLATE: FOLATE: 11.1 ng/mL (ref >5.9)

## 2018-08-29 LAB — RETICULOCYTES
RBC.: 1.66 MIL/uL — ABNORMAL LOW (ref 3.87–5.11)
RETIC COUNT ABSOLUTE: 64.7 10*3/uL (ref 19.0–186.0)
Retic Ct Pct: 3.9 % — ABNORMAL HIGH (ref 0.4–3.1)

## 2018-08-29 LAB — FERRITIN: Ferritin: 820 ng/mL — ABNORMAL HIGH (ref 11–307)

## 2018-08-29 LAB — POC OCCULT BLOOD, ED: Fecal Occult Bld: NEGATIVE

## 2018-08-29 LAB — VITAMIN B12: Vitamin B-12: 944 pg/mL — ABNORMAL HIGH (ref 180–914)

## 2018-08-29 MED ORDER — IOPAMIDOL (ISOVUE-300) INJECTION 61%
100.0000 mL | Freq: Once | INTRAVENOUS | Status: AC | PRN
  Administered 2018-08-29: 100 mL via INTRAVENOUS

## 2018-08-29 MED ORDER — THIAMINE HCL 100 MG/ML IJ SOLN: 100.0000 mg | Freq: Every day | INTRAMUSCULAR | Status: DC

## 2018-08-29 MED ORDER — ONDANSETRON HCL 4 MG/2ML IJ SOLN
4.0000 mg | Freq: Four times a day (QID) | INTRAMUSCULAR | Status: DC | PRN
  Administered 2018-09-01 – 2018-09-02 (×3): 4 mg via INTRAVENOUS
  Filled 2018-08-29 (×3): qty 2

## 2018-08-29 MED ORDER — ACETAMINOPHEN 325 MG PO TABS
650.0000 mg | ORAL_TABLET | Freq: Four times a day (QID) | ORAL | Status: DC | PRN
  Administered 2018-08-29 – 2018-09-02 (×8): 650 mg via ORAL
  Filled 2018-08-29 (×8): qty 2

## 2018-08-29 MED ORDER — FEBUXOSTAT 40 MG PO TABS
40.0000 mg | ORAL_TABLET | Freq: Every day | ORAL | Status: DC
  Administered 2018-08-30 – 2018-09-02 (×4): 40 mg via ORAL
  Filled 2018-08-29 (×4): qty 1

## 2018-08-29 MED ORDER — ACETAMINOPHEN 650 MG RE SUPP: 650.0000 mg | Freq: Four times a day (QID) | RECTAL | Status: DC | PRN

## 2018-08-29 MED ORDER — IOPAMIDOL (ISOVUE-300) INJECTION 61%
INTRAVENOUS | Status: AC
  Filled 2018-08-29: qty 100

## 2018-08-29 MED ORDER — GEMFIBROZIL 600 MG PO TABS
600.0000 mg | ORAL_TABLET | Freq: Every morning | ORAL | Status: DC
  Administered 2018-08-30 – 2018-09-02 (×4): 600 mg via ORAL
  Filled 2018-08-29 (×4): qty 1

## 2018-08-29 MED ORDER — PANTOPRAZOLE SODIUM 40 MG PO TBEC
40.0000 mg | DELAYED_RELEASE_TABLET | Freq: Every day | ORAL | Status: DC
  Administered 2018-08-30 – 2018-09-02 (×4): 40 mg via ORAL
  Filled 2018-08-29 (×4): qty 1

## 2018-08-29 MED ORDER — ENSURE ENLIVE PO LIQD
237.0000 mL | Freq: Two times a day (BID) | ORAL | Status: DC
  Administered 2018-08-30 – 2018-09-02 (×6): 237 mL via ORAL

## 2018-08-29 MED ORDER — SODIUM CHLORIDE 0.9 % IJ SOLN
INTRAMUSCULAR | Status: AC
  Filled 2018-08-29: qty 50

## 2018-08-29 MED ORDER — SODIUM CHLORIDE 0.9% IV SOLUTION: Freq: Once | INTRAVENOUS | Status: DC

## 2018-08-29 MED ORDER — ONDANSETRON HCL 4 MG PO TABS
4.0000 mg | ORAL_TABLET | Freq: Four times a day (QID) | ORAL | Status: DC | PRN
  Administered 2018-08-31 – 2018-09-01 (×2): 4 mg via ORAL
  Filled 2018-08-29 (×2): qty 1

## 2018-08-29 MED ORDER — SODIUM CHLORIDE 0.9 % IV SOLN
INTRAVENOUS | Status: DC
  Administered 2018-08-30: 04:00:00 via INTRAVENOUS

## 2018-08-29 MED ORDER — MIRTAZAPINE 15 MG PO TABS
7.5000 mg | ORAL_TABLET | Freq: Every day | ORAL | Status: DC
  Administered 2018-08-29 – 2018-09-01 (×4): 7.5 mg via ORAL
  Filled 2018-08-29 (×4): qty 1

## 2018-08-29 MED ORDER — SODIUM CHLORIDE 0.9 % IV SOLN
10.0000 mL/h | Freq: Once | INTRAVENOUS | Status: AC
  Administered 2018-08-30: 10 mL/h via INTRAVENOUS

## 2018-08-29 MED ORDER — FENTANYL CITRATE (PF) 100 MCG/2ML IJ SOLN
50.0000 ug | Freq: Once | INTRAMUSCULAR | Status: AC
  Administered 2018-08-29: 50 ug via INTRAVENOUS
  Filled 2018-08-29: qty 2

## 2018-08-29 MED ORDER — SODIUM CHLORIDE 0.9 % IV BOLUS
1000.0000 mL | Freq: Once | INTRAVENOUS | Status: AC
  Administered 2018-08-29: 1000 mL via INTRAVENOUS

## 2018-08-29 MED ORDER — LORATADINE 10 MG PO TABS: 10.0000 mg | ORAL_TABLET | Freq: Every day | ORAL | Status: DC | PRN

## 2018-08-29 NOTE — ED Notes (Signed)
Date and time results received: 08/29/18 1731 (use smartphrase ".now" to insert current time)  Test: WBC 1.0 Hbg 5.5  Critical Value:  Name of Provider Notified: Dr. Ronnald Nian Orders Received? Or Actions Taken?:

## 2018-08-29 NOTE — ED Provider Notes (Signed)
[facility] DEPT Provider Note   CSN: [id number] Arrival date & time: 08/29/18  1554     History   Chief Complaint Chief Complaint  Patient presents with  . Weakness    HPI Erica Escobar is a 82 y.o. female with history of non-Hodgkin's lymphoma, hyperlipidemia, hypertension, COPD, GERD, colon cancer status post partial colectomy presents for evaluation of gradual onset, progressively worsening fatigue and generalized weakness.  Seen and evaluated in the ED yesterday after falling at around 1 AM yesterday morning attempting to go to the bathroom.  Received 3 L of IV fluids and was found to be stable for discharge home at the time.  States that she fell yesterday afternoon attempting to go to the bathroom.  She states that she feels lightheaded with position changes and while ambulating and also feels short of breath while ambulating.  Notes chronic burning chest pain and chronic generalized abdominal pain which is unchanged.  Does note throbbing headache "all over ".  Denies vision changes or new numbness/tingling.  Uses a walker to ambulate at baseline.  While working with physical therapist at home today she was found to be orthostatic with BP "bottoming out " sitting upright on the side of the bed.  It was recommended that she present to the ED for evaluation of her hemoglobin and possible admission to the hospital and discharged to a skilled nursing facility.  Patient's son does note that she looks more pale than usual.  She notes bright red blood per rectum but states this is chronic for her as she has known hemorrhoids. Patient is generally a poor historian.   The history is provided by the patient.    Past Medical History:  Diagnosis Date  . Abnormal tympanic membrane    right ear and mastoid problems  . Arthritis    oseoarthritis  . Cancer of ascending colon ([place]) 02/15/2015  . Constipation, chronic   . COPD (chronic obstructive pulmonary disease)  (HCC)    mild COPD  . Depression   . Diverticulosis   . GERD (gastroesophageal reflux disease)   . Glaucoma    bilateral-sugery with laser, using eye drops  . Hemorrhoids    Internal  . History of hiatal hernia    dx. '87 Schatzki's ring  . Hyperlipidemia    good control  . Hypertension   . Insomnia    hx. of  . Keratosis, actinic   . Leg cramps   . Macular degeneration   . Neuromuscular disorder (HCC)    neuropathy legs,   . On aspirin at home    chronic use  . PONV (postoperative nausea and vomiting)   . Restless leg syndrome   . Schatzki's ring 1987  . Skin cancer    multiple, "tx. area right anterior wrist"  . Urinary incontinence    occ, stress    Patient Active Problem List   Diagnosis Date Noted  . Acute kidney injury ([place]) 08/14/2018  . Dehydration 08/14/2018  . FTT (failure to thrive) in adult 08/14/2018  . Pressure injury of skin 08/14/2018  . Acute back pain 07/23/2018  . CKD (chronic kidney disease), stage III ([place]) 07/23/2018  . Anorexia 07/23/2018  . Hyperlipidemia 07/23/2018  . 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 Metastatic cancer ([place]) 07/12/2018  . Chronic back pain 07/12/2018  . Pathologic rib fracture, initial encounter 07/12/2018  . Chest wall mass 07/12/2018  . Cancer of ascending colon s/p robotic colectomy 02/15/2015 02/15/2015  .  Restless leg syndrome   . Depression   . Insomnia   . COPD (chronic obstructive pulmonary disease) ([place])   . GERD (gastroesophageal reflux disease)     Past Surgical History:  Procedure Laterality Date  . ABDOMINAL HYSTERECTOMY    . BLADDER REPAIR    . CARDIAC CATHETERIZATION  2007, 2009  . CATARACT EXTRACTION Bilateral   . CHOLECYSTECTOMY    . COLECTOMY    . FINGER NAIL SURGERY    . FOOT SURGERY Right   . HEMORROIDECTOMY    . INNER EAR SURGERY Right    3-4 times  . INTRAOPERATIVE ARTERIOGRAM     right arm  . KNEE SURGERY Right   . LEG SURGERY Bilateral    laser ablation  . MASTOID DEBRIDEMENT      x2  . SKIN CANCER EXCISION     right/left thumb nail, left face, right ear/neck, right leg  . TOE FUSION    . TONSILLECTOMY       OB History   None      Home Medications    Prior to Admission medications   Medication Sig Start Date End Date Taking? Authorizing Provider  aspirin EC 81 MG tablet Take 81 mg by mouth every morning.   Yes [provider]  ENSURE (ENSURE) Take 237 mLs by mouth 2 (two) times daily between meals. VANILLA OR STRAWBERRY IF AVAILABLE PO DAILY    Yes [provider]  gemfibrozil (LOPID) 600 MG tablet Take 600 mg by mouth every morning.   Yes [provider]  mirtazapine (REMERON) 15 MG tablet Take 7.5 mg by mouth at bedtime. 06/29/18  Yes [provider]  multivitamin-lutein (OCUVITE-LUTEIN) CAPS capsule Take 1 capsule by mouth daily.   Yes [provider]  Omega-3 Fatty Acids (FISH OIL) 1000 MG CAPS Take 2,000 mg by mouth daily with breakfast.    Yes [provider]  pantoprazole (PROTONIX) 40 MG tablet Take 40 mg by mouth daily. 03/09/17  Yes [provider]  polyethylene glycol (MIRALAX / GLYCOLAX) packet Take 17 g by mouth daily as needed for moderate constipation.   Yes [provider]  Polyvinyl Alcohol-Povidone PF (REFRESH) 1.4-0.6 % SOLN Place 1-6 drops into both eyes daily.    Yes [provider]  potassium chloride (K-DUR) 10 MEQ tablet Take 10 mEq by mouth daily.  06/08/18  Yes [provider]  promethazine (PHENERGAN) 25 MG tablet Take 0.5 tablets (12.5 mg total) by mouth every 8 (eight) hours as needed for nausea or vomiting. 08/18/18  Yes Tanner, Lucianne Lei E., PA-C  ULORIC 40 MG tablet Take 40 mg by mouth daily.  03/16/18  Yes [provider]  vitamin B-12 (CYANOCOBALAMIN) 1000 MCG tablet Take 1,000 mcg by mouth daily.   Yes [provider]  loratadine (CLARITIN) 10 MG tablet Take 10 mg by mouth daily as needed for allergies.    [provider]   trolamine salicylate (ASPERCREME) 10 % cream Apply 1 application topically as needed for muscle pain (as needed for back pain).    [provider]    Family History No family history on file.  Social History Social History   Tobacco Use  . Smoking status: Former Research scientist (life sciences)  . Smokeless tobacco: Never Used  . Tobacco comment: short time x1 - during pregnancy  Substance Use Topics  . Alcohol use: No    Alcohol/week: 0.0 standard drinks  . Drug use: No     Allergies   Azithromycin; Allopurinol;  Cephalexin; Codeine; Gabapentin; Niaspan [niacin]; Oxybutynin; Paxil [paroxetine hcl]; Penicillins; Pentazocine; Tizanidine; Tramadol; and Vicodin [hydrocodone-acetaminophen]   Review of Systems Review of Systems  Constitutional: Positive for activity change, appetite change and fatigue. Negative for fever.  Eyes: Negative for visual disturbance.  Respiratory: Positive for shortness of breath.   Cardiovascular: Positive for chest pain (chronic, unchanged).  Gastrointestinal: Positive for abdominal pain (chronic, unchanged), blood in stool and nausea. Negative for constipation, diarrhea and vomiting.  Neurological: Positive for weakness (generalized), light-headedness and headaches. Negative for syncope and numbness.  All other systems reviewed and are negative.    Physical Exam Updated Vital Signs BP (!) 125/55   Pulse 86   Temp 98.2 F (36.8 C) (Oral)   Resp (!) 24   SpO2 96%   Physical Exam  Constitutional: She appears well-developed and well-nourished. No distress.  HENT:  Head: Normocephalic and atraumatic.  No Battle's signs, no raccoon's eyes, no rhinorrhea. No hemotympanum. No tenderness to palpation of the face.  Diffuse tenderness to palpation of the skull with nodeformity, crepitus, or swelling noted.   Eyes: Conjunctivae are normal. Right eye exhibits no discharge. Left eye exhibits no discharge.  Neck: Normal range of motion. Neck supple. No JVD present. No  tracheal deviation present.  Diffuse cervical spine tenderness with no focal tenderness.  No deformity, crepitus, or step-off noted.  Cardiovascular: Normal rate, regular rhythm, normal heart sounds and intact distal pulses.  Pulmonary/Chest: Effort normal and breath sounds normal. She exhibits tenderness.  Diffuse tenderness to palpation of the chest wall with no deformity, crepitus, ecchymosis, or flail segment.  Patient states this is chronic and unchanged  Abdominal: Soft. Bowel sounds are normal. She exhibits no distension. There is tenderness. There is no guarding.  Diffuse generalized tenderness to palpation of the abdomen, patient states this is chronic and unchanged  Genitourinary:  Genitourinary Comments: Examination performed in the presence of chaperone.  There is a large external hemorrhoid which does not appear to be thrombosed or actively bleeding.  Nontender to palpation.  No frank rectal bleeding.  Stool is light brown in color  Musculoskeletal: She exhibits no edema.  4+/5 strength of bilateral hip flexors and deltoids, otherwise 5/5 strength of BUE and BLE major muscle groups.  Neurological: She is alert. No cranial nerve deficit or sensory deficit.  Fluent speech with no evidence of dysarthria or aphasia, no facial droop.  Sensation intact to soft touch of extremities.  No pronator drift.  Skin: Skin is warm and dry. No erythema.  3 cm superficial skin tear noted to the dorsal aspect of the right forearm, no active bleeding.  Nontender to palpation, no surrounding erythema, induration, or fluctuance.  Psychiatric: She has a normal mood and affect. Her behavior is normal.  Nursing note and vitals reviewed.    ED Treatments / Results  Labs (all labs ordered are listed, but only abnormal results are displayed) Labs Reviewed  BASIC METABOLIC PANEL - Abnormal; Notable for the following components:      Result Value   Calcium 8.0 (*)    All other components within normal  limits  CBC WITH DIFFERENTIAL/PLATELET - Abnormal; Notable for the following components:   WBC 1.0 (*)    RBC 1.79 (*)    Hemoglobin 5.5 (*)    HCT 15.9 (*)    RDW 16.1 (*)    Platelets 121 (*)    Neutro Abs 0.3 (*)    Lymphs Abs 0.3 (*)    All other components within  normal limits  VITAMIN B12  FOLATE  IRON AND TIBC  FERRITIN  RETICULOCYTES  TROPONIN I  POC OCCULT BLOOD, ED  TYPE AND SCREEN  PREPARE RBC (CROSSMATCH)    EKG None  Radiology Ct Head Wo Contrast  Result Date: 08/29/2018 CLINICAL DATA:  Generalized weakness with recent fall. EXAM: CT HEAD WITHOUT CONTRAST TECHNIQUE: Contiguous axial images were obtained from the base of the skull through the vertex without intravenous contrast. COMPARISON:  None. FINDINGS: Brain: There is no mass, hemorrhage or extra-axial collection. The size and configuration of the ventricles and extra-axial CSF spaces are normal. There is no acute or chronic infarction. There is hypoattenuation of the periventricular white matter, most commonly indicating chronic ischemic microangiopathy. Vascular: No abnormal hyperdensity of the major intracranial arteries or dural venous sinuses. No intracranial atherosclerosis. Skull: The visualized skull base, calvarium and extracranial soft tissues are normal. Sinuses/Orbits: Remote right mastoidectomy. The right sphenoid sinus is opacified. The orbits are normal. IMPRESSION: 1. No acute intracranial abnormality. 2. Chronic small vessel ischemia. Electronically Signed   By: Ulyses Jarred M.D.   On: 08/29/2018 19:15   Ct Cervical Spine Wo Contrast  Result Date: 08/29/2018 CLINICAL DATA:  Generalized weakness with fall. EXAM: CT CERVICAL SPINE WITHOUT CONTRAST TECHNIQUE: Multidetector CT imaging of the cervical spine was performed without intravenous contrast. Multiplanar CT image reconstructions were also generated. COMPARISON:  08/14/2018. FINDINGS: Alignment: 3 mm of facet mediated anterolisthesis C7-T1, 1-2 mm  of similar facet mediated slip C6-7. Skull base and vertebrae: No acute fracture. No primary bone lesion or focal pathologic process. Soft tissues and spinal canal: No prevertebral fluid or swelling. No visible canal hematoma. Disc levels: No acute findings. Multilevel disc space narrowing. Facet disease. Upper chest:  No pneumothorax or visible mass. Other: Similar appearance to most recent priors. IMPRESSION: Cervical spondylosis without acute findings. Electronically Signed   By: Staci Righter M.D.   On: 08/29/2018 19:15   Dg Chest Port 1 View  Result Date: 08/28/2018 CLINICAL DATA:  Fall this morning, increasing weakness. History of lymphoma. EXAM: PORTABLE CHEST 1 VIEW COMPARISON:  Chest x-rays dated 08/14/2018 and 07/12/2018. FINDINGS: Heart size and mediastinal contours are stable. Lungs are clear. No pleural effusion or pneumothorax seen. RIGHT-sided PICC line appears appropriately position with tip at the level of the lower SVC. Osseous structures about the chest are unremarkable. IMPRESSION: No acute findings. No evidence of pneumonia or pulmonary edema. No osseous fracture or dislocation seen. Electronically Signed   By: Franki Cabot M.D.   On: 08/28/2018 11:35    Procedures .Critical Care Performed by: Renita Papa, PA-C Authorized by: Renita Papa, PA-C   Critical care provider statement:    Critical care time (minutes):  35   Critical care was necessary to treat or prevent imminent or life-threatening deterioration of the following conditions:  Circulatory failure   Critical care was time spent personally by me on the following activities:  Discussions with consultants, evaluation of patient's response to treatment, examination of patient, ordering and performing treatments and interventions, ordering and review of laboratory studies, ordering and review of radiographic studies, pulse oximetry, re-evaluation of patient's condition, obtaining history from patient or surrogate and  review of old charts   I assumed direction of critical care for this patient from another provider in my specialty: no     (including critical care time)  Medications Ordered in ED Medications  0.9 %  sodium chloride infusion (has no administration in time range)  sodium  chloride 0.9 % bolus 1,000 mL (1,000 mLs Intravenous New Bag/Given 08/29/18 1740)  fentaNYL (SUBLIMAZE) injection 50 mcg (50 mcg Intravenous Given 08/29/18 2029)     Initial Impression / Assessment and Plan / ED Course  I have reviewed the triage vital signs and the nursing notes.  Pertinent labs & imaging results that were available during my care of the patient were reviewed by me and considered in my medical decision making (see chart for details).     Patient with progressively worsening fatigue and generalized weakness.  Increased falls, most recently yesterday afternoon.  She is afebrile, vital signs are stable although she is orthostatic.  Hemoglobin today 5.5 down from 8.3 yesterday.  She did receive 3 L of normal saline in the ED so her anemia may be dilutional however her WBC count did improve as compared to yesterday which suggests otherwise.  Guaiac stools negative today though she does have a large external hemorrhoid and has required blood transfusion in the past for what was believed to be chronic GI bleed secondary to her colon cancer.  Head CT shows no acute intracranial abnormality, no acute cervical spine injury on neck CT.  Blood transfusion initiated in the ED, 2 units ordered.  Spoke with Dr. Roel Cluck with [facility] hospitalist service who agrees to assume care of patient and bring her into the hospital for further evaluation and management. Final Clinical Impressions(s) / ED Diagnoses   Final diagnoses:  Symptomatic anemia    ED Discharge Orders    None       Renita Papa, PA-C 08/29/18 2034    Lennice Sites, DO 08/30/18 0215

## 2018-08-29 NOTE — Telephone Encounter (Signed)
Telephone call received from patients daughter in law. Patient was unable to participate in PT at home this morning. Report from the physical therapy was patient's bp "bottomed out" just sitting on the side of the bed. He was uncomfortable with ambulating or exercising. Patient is very fatigued and pale. DIL is concerned about hemoglobin. Recent ED visit due to a fall. Patient has declined further, she is not eating or drinking. She is home alone at this time without any care, Son and DIL working and live at a different home.  Discussed with Dr Benay Spice who advised patient should return to ED to check hgb and possible admission and discharge to a SNF. Advised DIL and she states she will get her to the ED.

## 2018-08-29 NOTE — H&P (Signed)
Erica Escobar INO:[id number] DOB: [birth year] DOA: 08/29/2018     PCP: Deland Pretty, MD   Outpatient Specialists:      Oncology Dr. Benay Spice   Rad/[facility] [place] Patient arrived to ER on 08/29/18 at 1554  Patient coming from: home Lives alone,       Chief Complaint:  Chief Complaint  Patient presents with  . Weakness    HPI: Erica Escobar is a 82 y.o. female with medical history significant of CKD, HLD, large cell lymphoma, colon cancer cancer S/P colectomy, COPD, GERD    Presented with fatigue and positional lightheadedness trouble ambulating, she have had a fall a few days ago cannot recollect how that happened unable to say if she hit her head or abdomen. Family feels it is likely her blood pressure went down and she collapsed.  Last time patient admitted in 1 September when she was evaluated for fatigue was found to be dehydrated and orthostatic AKI and severe constipation.  At the time of discharge in the fourth creatinine back to baseline of 0.8 her fecal impaction has resolved wax Patient has baseline anemia with hemoglobin at baseline around 9 Hx of hemorrhoids has been having some blood in his stools Yesterday she was seen in the emergency department for fall secondary to progressive generalized weakness for the past few weeks no associated chest pain or shortness of breath no fever In the emergency department yesterday she was noted to be pancytopenic platelets down to 104 WBC down to 0.2  ANC 0.0 and hemoglobin 8.3. Oncology has been consulted and felt that hemoglobin drop was secondary to recent chemotherapy given no fever patient was rehydrated and discharged back to home.  CT head neck at that time nonacute she received 3 L of IV fluids And was able to be discharged home today she reports when she was seen by physical therapy her blood pressure bottom out she was sitting at the side of the bed and feeling very lightheaded when she tries to ambulate appeared to be  very pale she has not been eating or drinking well patient and family called oncologist who recommended for her to come back to emergency department. Endorsing chronic burning chest pain and headache that has been throbbing. 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 CT Head Wo Contrast  . CT Cervical Spine Wo Contrast  . Basic metabolic panel  . CBC with Differential  . Orthostatic vital signs  . Vital signs  . Complete patient signature process for consent form  . Practitioner attestation of consent  . Consult to hospitalist  . POC occult blood, ED Provider will collect  . ED EKG  . Type and screen [place]  . Prepare RBC  Following Medications were ordered in ER: Medications  0.9 %  sodium chloride infusion (has no administration in time range)  fentaNYL (SUBLIMAZE) injection 50 mcg (has no administration in time range)  sodium chloride 0.9 % bolus 1,000 mL (1,000 mLs Intravenous New Bag/Given 08/29/18 1740)    Significant initial  Findings: Abnormal Labs Reviewed  BASIC METABOLIC PANEL - Abnormal; Notable for the following components:      Result Value   Calcium 8.0 (*)    All other components within normal limits  CBC WITH  DIFFERENTIAL/PLATELET - Abnormal; Notable for the following components:   WBC 1.0 (*)    RBC 1.79 (*)    Hemoglobin 5.5 (*)    HCT 15.9 (*)    RDW 16.1 (*)    Platelets 121 (*)    Neutro Abs 0.3 (*)    Lymphs Abs 0.3 (*)    All other components within normal limits     Na 139 K 3.6  Cr    stable,    Lab Results  Component Value Date   CREATININE 0.76 08/29/2018   CREATININE 0.83 08/28/2018   CREATININE 0.74 08/22/2018      WBC  1.0 ANC 0.3  HG/HCT    Down  from baseline see below    Component Value Date/Time   HGB 5.5 (LL) 08/29/2018 1700   HGB 9.3 (L) 08/22/2018 0840   HGB 12.3 03/20/2016 1119   HCT 15.9 (L) 08/29/2018 1700   HCT 35.5 03/20/2016 1119    Lactic Acid, Venous    Component Value Date/Time   LATICACIDVEN 1.26 01/03/2018 1255      UA  not ordered   CT HEAD  NON acute    ECG:  Personally reviewed by me showing: HR : 89 Rhythm:  NSR    no evidence of ischemic changes QTC 469     ED Triage Vitals  Enc Vitals Group     BP 08/29/18 1700 (!) 120/52     Pulse Rate 08/29/18 1700 86     Resp 08/29/18 1700 15     Temp 08/29/18 1700 98.2 F (36.8 C)     Temp Source 08/29/18 1700 Oral     SpO2 08/29/18 1700 98 %     Weight --      Height --      Head Circumference --      Peak Flow --      Pain Score 08/29/18 1650 9     Pain Loc --      Pain Edu? --      Excl. in [place]? --   TMAX(24)@       Latest  Blood pressure (!) 125/55, pulse 86, temperature 98.2 F (36.8 C), temperature source Oral, resp. rate (!) 24, SpO2 96 %.   Hospitalist was called for admission for symptomatic anemia in the setting of recent chemotherapy   Review of Systems:    Pertinent positives include:  Fatigue, weight loss blood in stool, nausea, vomiting, soft stools abdominal pain,  Constitutional:  No weight loss, night sweats, Fevers, chills,  HEENT:  No headaches, Difficulty swallowing,Tooth/dental problems,Sore throat,  No sneezing, itching, ear ache, nasal  congestion, post nasal drip,  Cardio-vascular:  No chest pain, Orthopnea, PND, anasarca, dizziness, palpitations.no Bilateral lower extremity swelling  GI:  No heartburn, indigestion, diarrhea, change in bowel habits, loss of appetite, melena,  hematemesis Resp:  no shortness of breath at rest. No dyspnea on exertion, No excess mucus, no productive cough, No  non-productive cough, No coughing up of blood.No change in color of mucus.No wheezing. Skin:  no rash or lesions. No jaundice GU:  no dysuria, change in color of urine, no urgency or frequency. No straining to urinate.  No flank pain.  Musculoskeletal:  No joint pain or no joint swelling. No decreased range of motion. No back pain.  Psych:  No change in mood or affect. No depression or anxiety. No memory loss.  Neuro: no localizing neurological complaints, no tingling, no weakness, no double vision, no gait abnormality, no slurred speech, no confusion  All systems reviewed and apart from [place] all are negative  Past Medical History:   Past Medical History:  Diagnosis Date  . Abnormal tympanic membrane    right ear and mastoid problems  . Arthritis    oseoarthritis  . Cancer of ascending colon ([place]) 02/15/2015  . Constipation, chronic   . COPD (chronic obstructive pulmonary disease) (HCC)    mild COPD  . Depression   . Diverticulosis   . GERD (gastroesophageal reflux disease)   . Glaucoma    bilateral-sugery with laser, using eye drops  . Hemorrhoids    Internal  . History of hiatal hernia    dx. '87 Schatzki's ring  . Hyperlipidemia    good control  . Hypertension   . Insomnia    hx. of  . Keratosis, actinic   . Leg cramps   . Macular degeneration   . Neuromuscular disorder (HCC)    neuropathy legs,   . On aspirin at home    chronic use  . PONV (postoperative nausea and vomiting)   . Restless leg syndrome   . Schatzki's ring 1987  . Skin cancer    multiple, "tx. area right anterior wrist"  . Urinary  incontinence    occ, stress      Past Surgical History:  Procedure Laterality Date  . ABDOMINAL HYSTERECTOMY    . BLADDER REPAIR    . CARDIAC CATHETERIZATION  2007, 2009  . CATARACT EXTRACTION Bilateral   . CHOLECYSTECTOMY    . COLECTOMY    . FINGER NAIL SURGERY    . FOOT SURGERY Right   . HEMORROIDECTOMY    . INNER EAR SURGERY Right    3-4 times  . INTRAOPERATIVE ARTERIOGRAM     right arm  . KNEE SURGERY Right   . LEG SURGERY Bilateral    laser ablation  . MASTOID DEBRIDEMENT     x2  . SKIN CANCER EXCISION     right/left thumb nail, left face, right ear/neck, right leg  . TOE FUSION    . TONSILLECTOMY      Social History:  Ambulatory lately with difficulty    reports that she has quit smoking. She has never used smokeless tobacco. She reports that she does not drink alcohol or use drugs.     Family History:   Family History  Problem Relation Age of Onset  . Diabetes Mother   . Cancer Father   . Cancer Brother   . Cancer Brother   . Clotting disorder Child   . Hypertension Other     Allergies: Allergies  Allergen Reactions  . Azithromycin Itching  . Allopurinol Other (See Comments)    Unknown  . Cephalexin Other (See Comments)    Unknown  . Codeine Itching  . Gabapentin Swelling and Other (See Comments)    Throat and leg swelling  . Niaspan [Niacin] Other (See Comments)    Unknown  . Oxybutynin  Other (See Comments)    "made me go crazy"-- had crazy dreams.   . Paxil [Paroxetine Hcl] Other (See Comments)    Unknown reaction  . Penicillins Itching and Other (See Comments)    Has patient had a PCN reaction causing immediate rash, facial/tongue/throat swelling, SOB or lightheadedness with hypotension: No Has patient had a PCN reaction causing severe rash involving mucus membranes or skin necrosis: Yes Has patient had a PCN reaction that required hospitalization: No Has patient had a PCN reaction occurring within the last 10 years: No If all of the  above answers are "NO", then may proceed with Cephalosporin use.   Marland Kitchen Pentazocine Itching, Nausea Only and Other (See Comments)    Headache.   . Tizanidine Other (See Comments)    "made me crazy"  . Tramadol Other (See Comments)    Head feels "swimmy" and weakness  . Vicodin [Hydrocodone-Acetaminophen] Other (See Comments)    "made me go crazy"     Prior to Admission medications   Medication Sig Start Date End Date Taking? Authorizing Provider  aspirin EC 81 MG tablet Take 81 mg by mouth every morning.   Yes [provider]  ENSURE (ENSURE) Take 237 mLs by mouth 2 (two) times daily between meals. VANILLA OR STRAWBERRY IF AVAILABLE PO DAILY    Yes [provider]  gemfibrozil (LOPID) 600 MG tablet Take 600 mg by mouth every morning.   Yes [provider]  mirtazapine (REMERON) 15 MG tablet Take 7.5 mg by mouth at bedtime. 06/29/18  Yes [provider]  multivitamin-lutein (OCUVITE-LUTEIN) CAPS capsule Take 1 capsule by mouth daily.   Yes [provider]  Omega-3 Fatty Acids (FISH OIL) 1000 MG CAPS Take 2,000 mg by mouth daily with breakfast.    Yes [provider]  pantoprazole (PROTONIX) 40 MG tablet Take 40 mg by mouth daily. 03/09/17  Yes [provider]  polyethylene glycol (MIRALAX / GLYCOLAX) packet Take 17 g by mouth daily as needed for moderate constipation.   Yes [provider]  Polyvinyl Alcohol-Povidone PF (REFRESH) 1.4-0.6 % SOLN Place 1-6 drops into both eyes daily.    Yes [provider]  potassium chloride (K-DUR) 10 MEQ tablet Take 10 mEq by mouth daily.  06/08/18  Yes [provider]  promethazine (PHENERGAN) 25 MG tablet Take 0.5 tablets (12.5 mg total) by mouth every 8 (eight) hours as needed for nausea or vomiting. 08/18/18  Yes Tanner, Lucianne Lei E., PA-C  ULORIC 40 MG tablet Take 40 mg by mouth daily.  03/16/18  Yes [provider]  vitamin B-12 (CYANOCOBALAMIN) 1000 MCG tablet Take  1,000 mcg by mouth daily.   Yes [provider]  loratadine (CLARITIN) 10 MG tablet Take 10 mg by mouth daily as needed for allergies.    [provider]  trolamine salicylate (ASPERCREME) 10 % cream Apply 1 application topically as needed for muscle pain (as needed for back pain).    [provider]   Physical Exam: Blood pressure (!) 125/55, pulse 86, temperature 98.2 F (36.8 C), temperature source Oral, resp. rate (!) 24, SpO2 96 %. 1. General:  in No Acute distress  Chronically ill -appearing 2. Psychological: Alert and   Oriented 3. Head/ENT:    Dry Mucous Membranes                          Head Non traumatic, neck supple  Poor Dentition 4. SKIN: decreased Skin turgor,  Skin clean Dry and intact no rash 5. Heart: Regular rate and rhythm no  Murmur, no Rub or gallop 6. Lungs:   Clear to auscultation bilaterally, no wheezes or crackles   7. Abdomen: Soft, right lower quadrant was tender, Non distended  Obese bowel sounds present 8. Lower extremities: no clubbing, cyanosis, or  edema 9. Neurologically Grossly intact, moving all 4 extremities equally  10. MSK: Normal range of motion   LABS:     Recent Labs  Lab 08/28/18 0801 08/29/18 1700  WBC 0.2* 1.0*  NEUTROABS 0.0* 0.3*  HGB 8.3* 5.5*  HCT 24.0* 15.9*  MCV 89.2 88.8  PLT 104* 782*   Basic Metabolic Panel: Recent Labs  Lab 08/28/18 0801 08/29/18 1700  NA 139 139  K 3.6 3.6  CL 105 105  CO2 26 27  GLUCOSE 101* 85  BUN 24* 13  CREATININE 0.83 0.76  CALCIUM 8.0* 8.0*      No results for input(s): AST, ALT, ALKPHOS, BILITOT, PROT, ALBUMIN in the last 168 hours. No results for input(s): LIPASE, AMYLASE in the last 168 hours. No results for input(s): AMMONIA in the last 168 hours.    HbA1C: No results for input(s): HGBA1C in the last 72 hours. CBG: No results for input(s): GLUCAP in the last 168 hours.    Urine analysis:    Component Value Date/Time    COLORURINE YELLOW 08/28/2018 0753   APPEARANCEUR HAZY (A) 08/28/2018 0753   LABSPEC 1.016 08/28/2018 0753   PHURINE 5.0 08/28/2018 0753   GLUCOSEU NEGATIVE 08/28/2018 0753   HGBUR NEGATIVE 08/28/2018 0753   BILIRUBINUR NEGATIVE 08/28/2018 0753   KETONESUR NEGATIVE 08/28/2018 0753   PROTEINUR NEGATIVE 08/28/2018 0753   UROBILINOGEN 0.2 03/10/2009 1608   NITRITE NEGATIVE 08/28/2018 0753   LEUKOCYTESUR NEGATIVE 08/28/2018 0753       Cultures:    Component Value Date/Time   SDES PORTA CATH BLOOD 08/18/2018 1402   SPECREQUEST  08/18/2018 1402    BOTTLES DRAWN AEROBIC AND ANAEROBIC Blood Culture adequate volume   CULT  08/18/2018 1402    NO GROWTH 5 DAYS Performed at [facility], [place] [address], [place], [place] [address]    REPTSTATUS 08/23/2018 FINAL 08/18/2018 1402     Radiological Exams on Admission: Dg Abd 1 View  Result Date: 08/29/2018 CLINICAL DATA:  Generalize weakness EXAM: ABDOMEN - 1 VIEW COMPARISON:  08/16/2018, CT 08/14/2018, PET-CT 08/10/2018 FINDINGS: Lung bases are clear. Surgical clips in the right upper quadrant. Nonobstructed gas pattern with scattered small and large bowel gas. Surgical changes in the right mid abdomen. No abnormal calcification. IMPRESSION: Nonobstructed bowel-gas pattern. Electronically Signed   By: Donavan Foil M.D.   On: 08/29/2018 21:52   Ct Head Wo Contrast  Result Date: 08/29/2018 CLINICAL DATA:  Generalized weakness with recent fall. EXAM: CT HEAD WITHOUT CONTRAST TECHNIQUE: Contiguous axial images were obtained from the base of the skull through the vertex without intravenous contrast. COMPARISON:  None. FINDINGS: Brain: There is no mass, hemorrhage or extra-axial collection. The size and configuration of the ventricles and extra-axial CSF spaces are normal. There is no acute or chronic infarction. There is hypoattenuation of the periventricular white matter, most commonly indicating chronic ischemic microangiopathy. Vascular: No  abnormal hyperdensity of the major intracranial arteries or dural venous sinuses. No intracranial atherosclerosis. Skull: The visualized skull base, calvarium and extracranial soft tissues are normal. Sinuses/Orbits: Remote right mastoidectomy. The right sphenoid sinus is opacified.  The orbits are normal. IMPRESSION: 1. No acute intracranial abnormality. 2. Chronic small vessel ischemia. Electronically Signed   By: Ulyses Jarred M.D.   On: 08/29/2018 19:15   Ct Cervical Spine Wo Contrast  Result Date: 08/29/2018 CLINICAL DATA:  Generalized weakness with fall. EXAM: CT CERVICAL SPINE WITHOUT CONTRAST TECHNIQUE: Multidetector CT imaging of the cervical spine was performed without intravenous contrast. Multiplanar CT image reconstructions were also generated. COMPARISON:  08/14/2018. FINDINGS: Alignment: 3 mm of facet mediated anterolisthesis C7-T1, 1-2 mm of similar facet mediated slip C6-7. Skull base and vertebrae: No acute fracture. No primary bone lesion or focal pathologic process. Soft tissues and spinal canal: No prevertebral fluid or swelling. No visible canal hematoma. Disc levels: No acute findings. Multilevel disc space narrowing. Facet disease. Upper chest:  No pneumothorax or visible mass. Other: Similar appearance to most recent priors. IMPRESSION: Cervical spondylosis without acute findings. Electronically Signed   By: Staci Righter M.D.   On: 08/29/2018 19:15   Ct Abdomen Pelvis W Contrast  Result Date: 08/29/2018 CLINICAL DATA:  82 year old female with history of non-Hodgkin's lymphoma, colon cancer status post partial colectomy presents with gradual onset of fatigue and generalized weakness. EXAM: CT ABDOMEN AND PELVIS WITH CONTRAST TECHNIQUE: Multidetector CT imaging of the abdomen and pelvis was performed using the standard protocol following bolus administration of intravenous contrast. CONTRAST:  158m ISOVUE-300 IOPAMIDOL (ISOVUE-300) INJECTION 61% COMPARISON:  08/14/2018 FINDINGS: Lower  chest: Top-normal size heart without pericardial effusion. Small hiatal hernia. Trace bilateral pleural effusions with atelectasis. No pulmonary consolidation or suspicious pulmonary lesions. Hepatobiliary: Cholecystectomy. Homogeneous appearance of the liver without space-occupying mass. No biliary dilatation. Pancreas: Normal Spleen: Splenic granuloma.  Normal splenic size.  No enhancing mass. Adrenals/Urinary Tract: No adrenal mass. Cortical enhancement is symmetric. No obstructive uropathy. The urinary bladder is unremarkable. Stomach/Bowel: Stigmata of right partial colectomy. No bowel obstruction or inflammation. The stomach is decompressed in appearance. There is normal small bowel rotation. Colonic diverticulosis without acute diverticulitis is identified along the course of the descending and sigmoid colon. Probable small internal hemorrhoids accounting for smooth thickening of the anus and distal rectum. Vascular/Lymphatic: Aortoiliac atherosclerosis. No adenopathy by CT size criteria. Reproductive: Hysterectomy.  No adnexal mass. Other: No free air nor free fluid. Musculoskeletal: Further decrease in right paraspinal soft tissue mass with adjacent lytic abnormality of the right tenth and eleventh ribs, currently 4.2 x 3.1 x 6.8 cm cm versus 3.6 x 5 x 8.6 cm at the same level previously. IMPRESSION: 1. Trace bilateral pleural effusions with atelectasis. 2. Smaller right paraspinal soft tissue mass with areas of lytic bone destruction redemonstrated involving the posterior right tenth and eleventh ribs. 3. No acute solid nor hollow visceral organ abnormality. Status post right partial colectomy. Electronically Signed   By: DAshley RoyaltyM.D.   On: 08/29/2018 22:58   Dg Chest Port 1 View  Result Date: 08/28/2018 CLINICAL DATA:  Fall this morning, increasing weakness. History of lymphoma. EXAM: PORTABLE CHEST 1 VIEW COMPARISON:  Chest x-rays dated 08/14/2018 and 07/12/2018. FINDINGS: Heart size and  mediastinal contours are stable. Lungs are clear. No pleural effusion or pneumothorax seen. RIGHT-sided PICC line appears appropriately position with tip at the level of the lower SVC. Osseous structures about the chest are unremarkable. IMPRESSION: No acute findings. No evidence of pneumonia or pulmonary edema. No osseous fracture or dislocation seen. Electronically Signed   By: SFranki CabotM.D.   On: 08/28/2018 11:35    Chart has been reviewed  Assessment/Plan   82 y.o. female with medical history significant of CKD, HLD, large cell lymphoma, colon cancer cancer S/P colectomy, COPD, GERD Admitted for symptomatic anemia  Present on Admission: . Symptomatic anemia -we will transfuse 2 units likely anemia secondary to recent chemotherapy no evidence of acute blood loss.  History of intermittent bright red blood secondary to hemorrhoids currently not active Shortness of breath chest pain of with mildly elevated troponin no evidence of ischemia on EKG likely in a setting of significant symptomatic anemia will transfuse and follow troponin monitor on telemetry doubt ACS  Debility - patient will benefit from PT OT evaluation . Cancer of ascending colon s/p robotic colectomy 02/15/2015 -will notify oncology the patient has been admitted . CKD (chronic kidney disease), stage III (HCC) currently at baseline continue to monitor avoid nephrotoxic medications . COPD (chronic obstructive pulmonary disease) (HCC) currently stable continue to monitor . FTT (failure to thrive) in adult - with significantly decreased p.o. intake will need nutrition consult check prealbumin . Hyperlipidemia stable continue home medicines . Large cell lymphoma [facility]) notify oncology the patient is being admitted with decreased functional status.  Family would like to have discussion regarding overall goals of care  Abdominal pain CT of abdomen no evidence of colitis  Decreased p.o. intake will have nutrition evaluate  patient Other plan as per orders.  DVT prophylaxis:  SCD    Code Status:    DNR/DNI  as per patient   I had personally discussed CODE STATUS with patient and family  Family Communication:   Family  at  Bedside   plan of care was discussed with  Son, Daughter in [place], Wife, Husband, Sister, Brother , father, mother  Disposition Plan:     likely will need placement for rehabilitation order social work consult and PT OT evaluation                                        Would benefit from PT/OT eval prior to [place] Work  consulted                   Nutrition    consulted                                     Consults called: Emailed oncology  Admission status:  Obs    Level of care    tele  For 12H        Caitlynne Harbeck 08/30/2018, 12:46 AM    [facility]  Pager [phone]   after 2 AM please page floor coverage PA If 7AM-7PM, please contact the day team taking care of the patient  [email or web address]  Password [email or web address]

## 2018-08-29 NOTE — ED Triage Notes (Signed)
Patient arrives with c/o generalized weakness, patient was seen last night for same, given 3L fluid and sent home. 518m given by EMS. Hx CHF, +orthostatic.  Supine: 120/60 Sitting: 90/40 HR: 90 RR: 16 O2: 96%  CBG: 147 20G inserted into Left ac 435mzofran given for nausea.

## 2018-08-29 NOTE — ED Notes (Signed)
ED TO INPATIENT HANDOFF REPORT  Name/Age/Gender Erica Escobar 82 y.o. female  Code Status Code Status History    Date Active Date Inactive Code Status Order ID Comments User Context   08/14/2018 1643 08/17/2018 2009 Full Code [id number]  Mendel Corning, MD Inpatient   07/12/2018 2005 07/16/2018 1546 Full Code [id number]  Tomma Rakers, MD ED   02/15/2015 1239 02/19/2015 1334 Full Code [id number]  Michael Boston, MD Inpatient      Home/SNF/Other Home  Chief Complaint Generalized Weakness  Level of Care/Admitting Diagnosis ED Disposition    ED Disposition Condition [facility] Area: [facility] [[id number]]  Level of Care: Telemetry [5]  Admit to tele based on following criteria: Monitor for Ischemic changes  Diagnosis: Symptomatic anemia [5277824]  Admitting Physician: Toy Baker [[id number]]  Attending Physician: Toy Baker [[id number]]  PT Class (Do Not Modify): Observation [104]  PT Acc Code (Do Not Modify): Observation [10022]       Medical History Past Medical History:  Diagnosis Date  . Abnormal tympanic membrane    right ear and mastoid problems  . Arthritis    oseoarthritis  . Cancer of ascending colon ([place]) 02/15/2015  . Constipation, chronic   . COPD (chronic obstructive pulmonary disease) (HCC)    mild COPD  . Depression   . Diverticulosis   . GERD (gastroesophageal reflux disease)   . Glaucoma    bilateral-sugery with laser, using eye drops  . Hemorrhoids    Internal  . History of hiatal hernia    dx. '87 Schatzki's ring  . Hyperlipidemia    good control  . Hypertension   . Insomnia    hx. of  . Keratosis, actinic   . Leg cramps   . Macular degeneration   . Neuromuscular disorder (HCC)    neuropathy legs,   . On aspirin at home    chronic use  . PONV (postoperative nausea and vomiting)   . Restless leg syndrome   . Schatzki's ring 1987  . Skin cancer    multiple, "tx. area right anterior wrist"  .  Urinary incontinence    occ, stress    Allergies Allergies  Allergen Reactions  . Azithromycin Itching  . Allopurinol Other (See Comments)    Unknown  . Cephalexin Other (See Comments)    Unknown  . Codeine Itching  . Gabapentin Swelling and Other (See Comments)    Throat and leg swelling  . Niaspan [Niacin] Other (See Comments)    Unknown  . Oxybutynin Other (See Comments)    "made me go crazy"-- had crazy dreams.   . Paxil [Paroxetine Hcl] Other (See Comments)    Unknown reaction  . Penicillins Itching and Other (See Comments)    Has patient had a PCN reaction causing immediate rash, facial/tongue/throat swelling, SOB or lightheadedness with hypotension: No Has patient had a PCN reaction causing severe rash involving mucus membranes or skin necrosis: Yes Has patient had a PCN reaction that required hospitalization: No Has patient had a PCN reaction occurring within the last 10 years: No If all of the above answers are "NO", then may proceed with Cephalosporin use.   Marland Kitchen Pentazocine Itching, Nausea Only and Other (See Comments)    Headache.   . Tizanidine Other (See Comments)    "made me crazy"  . Tramadol Other (See Comments)    Head feels "swimmy" and weakness  . Vicodin [Hydrocodone-Acetaminophen] Other (See Comments)    "made me go  crazy"    IV Location/Drains/Wounds Patient Lines/Drains/Airways Status   Active Line/Drains/Airways    Name:   Placement date:   Placement time:   Site:   Days:   Peripheral IV 08/29/18   08/29/18    1800    -   less than 1   PICC Single Lumen 08/01/18 PICC Right Brachial 38 cm   08/01/18    1343    Brachial   28   External Urinary Catheter   07/13/18    2207    -   47   Pressure Injury 08/14/18 Stage II -  Partial thickness loss of dermis presenting as a shallow open ulcer with a red, pink wound bed without slough.   08/14/18    1702     15          Labs/Imaging Results for orders placed or performed during the hospital encounter of  08/29/18 (from the past 48 hour(s))  Basic metabolic panel     Status: Abnormal   Collection Time: 08/29/18  5:00 PM  Result Value Ref Range   Sodium 139 135 - 145 mmol/L   Potassium 3.6 3.5 - 5.1 mmol/L   Chloride 105 98 - 111 mmol/L   CO2 27 22 - 32 mmol/L   Glucose, Bld 85 70 - 99 mg/dL   BUN 13 8 - 23 mg/dL   Creatinine, Ser 0.76 0.44 - 1.00 mg/dL   Calcium 8.0 (L) 8.9 - 10.3 mg/dL   GFR calc non Af Amer >60 >60 mL/min   GFR calc Af Amer >60 >60 mL/min    Comment: (NOTE) The eGFR has been calculated using the CKD EPI equation. This calculation has not been validated in all clinical situations. eGFR's persistently <60 mL/min signify possible Chronic Kidney Disease.    Anion gap 7 5 - 15    Comment: Performed at [facility], [place] [address], [place], [place] [address]  CBC with Differential     Status: Abnormal   Collection Time: 08/29/18  5:00 PM  Result Value Ref Range   WBC 1.0 (LL) 4.0 - 10.5 K/uL    Comment: REPEATED TO VERIFY CRITICAL RESULT CALLED TO, READ BACK BY AND VERIFIED WITH: Alto [place] [id number] @ 1732 BY J SCOTTON    RBC 1.79 (L) 3.87 - 5.11 MIL/uL   Hemoglobin 5.5 (LL) 12.0 - 15.0 g/dL    Comment: REPEATED TO VERIFY CRITICAL RESULT CALLED TO, READ BACK BY AND VERIFIED WITH: Alto Denver [id number] @ 1732 BY J SCOTTON    HCT 15.9 (L) 36.0 - 46.0 %   MCV 88.8 78.0 - 100.0 fL   MCH 30.7 26.0 - 34.0 pg   MCHC 34.6 30.0 - 36.0 g/dL   RDW 16.1 (H) 11.5 - 15.5 %   Platelets 121 (L) 150 - 400 K/uL   Neutrophils Relative % 34 %   Lymphocytes Relative 27 %   Monocytes Relative 37 %   Eosinophils Relative 1 %   Basophils Relative 1 %   Neutro Abs 0.3 (L) 1.7 - 7.7 K/uL   Lymphs Abs 0.3 (L) 0.7 - 4.0 K/uL   Monocytes Absolute 0.4 0.1 - 1.0 K/uL   Eosinophils Absolute 0.0 0.0 - 0.7 K/uL   Basophils Absolute 0.0 0.0 - 0.1 K/uL   RBC Morphology POLYCHROMASIA PRESENT    WBC Morphology WHITE COUNT CONFIRMED ON SMEAR    Smear Review  PLATELET COUNT CONFIRMED BY SMEAR     Comment: Performed at [facility], [place] [address], [place], [place] [address]  POC occult blood, ED Provider will collect     Status: None   Collection Time: 08/29/18  5:11 PM  Result Value Ref Range   Fecal Occult Bld NEGATIVE NEGATIVE  Type and screen [place]     Status: None (Preliminary result)   Collection Time: 08/29/18  5:34 PM  Result Value Ref Range   ABO/RH(D) A POS    Antibody Screen NEG    Sample Expiration 09/01/2018    Unit Number [id number]    Blood Component Type RED CELLS,LR    Unit division 00    Status of Unit ISSUED    Transfusion Status OK TO TRANSFUSE    Crossmatch Result      Compatible Performed at [facility], [place] [address], [place], [place] [address]    Unit Number [id number]    Blood Component Type RED CELLS,LR    Unit division 00    Status of Unit ALLOCATED    Transfusion Status OK TO TRANSFUSE    Crossmatch Result Compatible   Prepare RBC     Status: None   Collection Time: 08/29/18  5:34 PM  Result Value Ref Range   Order Confirmation      ORDER PROCESSED BY BLOOD BANK Performed at [facility], [place] [address], [place], [place] [address]   Vitamin B12     Status: Abnormal   Collection Time: 08/29/18  8:36 PM  Result Value Ref Range   Vitamin B-12 944 (H) 180 - 914 pg/mL    Comment: (NOTE) This assay is not validated for testing neonatal or myeloproliferative syndrome specimens for Vitamin B12 levels. Performed at [facility], [place] [address], [place], [place] [address]   Folate     Status: None   Collection Time: 08/29/18  8:36 PM  Result Value Ref Range   Folate 11.1 >5.9 ng/mL    Comment: Performed at [facility], [place] [address], [place], [place] [address]  Iron and TIBC     Status: Abnormal   Collection Time: 08/29/18  8:36 PM  Result Value Ref Range   Iron 34 28 - 170  ug/dL   TIBC 182 (L) 250 - 450 ug/dL   Saturation Ratios 19 10.4 - 31.8 %   UIBC 148 ug/dL    Comment: Performed at [facility], [place] [address], [place], [place] [address]  Ferritin     Status: Abnormal   Collection Time: 08/29/18  8:36 PM  Result Value Ref Range   Ferritin 820 (H) 11 - 307 ng/mL    Comment: Performed at [facility], [place] [address] [place], [place] [address]  Reticulocytes     Status: Abnormal   Collection Time: 08/29/18  8:36 PM  Result Value Ref Range   Retic Ct Pct 3.9 (H) 0.4 - 3.1 %   RBC. 1.66 (L) 3.87 - 5.11 MIL/uL   Retic Count, Absolute 64.7 19.0 - 186.0 K/uL    Comment: Performed at [facility], [place] [address], [place], [place] [address]  Troponin I     Status: Abnormal   Collection Time: 08/29/18  8:36 PM  Result Value Ref Range   Troponin I 0.03 (HH) <0.03 ng/mL    Comment: CRITICAL RESULT CALLED TO, READ BACK BY AND VERIFIED WITH: K Jessicaann Overbaugh,RN _0  08/29/18 MKELLY Performed at [facility], [place] [address], [place], [place] [address]  Dg Abd 1 View  Result Date: 08/29/2018 CLINICAL DATA:  Generalize weakness EXAM: ABDOMEN - 1 VIEW COMPARISON:  08/16/2018, CT 08/14/2018, PET-CT 08/10/2018 FINDINGS: Lung bases are clear. Surgical clips in the right upper quadrant. Nonobstructed gas pattern with scattered small and large bowel gas. Surgical changes in the right mid abdomen. No abnormal calcification. IMPRESSION: Nonobstructed bowel-gas pattern. Electronically Signed   By: Donavan Foil M.D.   On: 08/29/2018 21:52   Ct Head Wo Contrast  Result Date: 08/29/2018 CLINICAL DATA:  Generalized weakness with recent fall. EXAM: CT HEAD WITHOUT CONTRAST TECHNIQUE: Contiguous axial images were obtained from the base of the skull through the vertex without intravenous contrast. COMPARISON:  None. FINDINGS: Brain: There is no mass, hemorrhage or extra-axial collection. The size and  configuration of the ventricles and extra-axial CSF spaces are normal. There is no acute or chronic infarction. There is hypoattenuation of the periventricular white matter, most commonly indicating chronic ischemic microangiopathy. Vascular: No abnormal hyperdensity of the major intracranial arteries or dural venous sinuses. No intracranial atherosclerosis. Skull: The visualized skull base, calvarium and extracranial soft tissues are normal. Sinuses/Orbits: Remote right mastoidectomy. The right sphenoid sinus is opacified. The orbits are normal. IMPRESSION: 1. No acute intracranial abnormality. 2. Chronic small vessel ischemia. Electronically Signed   By: Ulyses Jarred M.D.   On: 08/29/2018 19:15   Ct Cervical Spine Wo Contrast  Result Date: 08/29/2018 CLINICAL DATA:  Generalized weakness with fall. EXAM: CT CERVICAL SPINE WITHOUT CONTRAST TECHNIQUE: Multidetector CT imaging of the cervical spine was performed without intravenous contrast. Multiplanar CT image reconstructions were also generated. COMPARISON:  08/14/2018. FINDINGS: Alignment: 3 mm of facet mediated anterolisthesis C7-T1, 1-2 mm of similar facet mediated slip C6-7. Skull base and vertebrae: No acute fracture. No primary bone lesion or focal pathologic process. Soft tissues and spinal canal: No prevertebral fluid or swelling. No visible canal hematoma. Disc levels: No acute findings. Multilevel disc space narrowing. Facet disease. Upper chest:  No pneumothorax or visible mass. Other: Similar appearance to most recent priors. IMPRESSION: Cervical spondylosis without acute findings. Electronically Signed   By: Staci Righter M.D.   On: 08/29/2018 19:15   Dg Chest Port 1 View  Result Date: 08/28/2018 CLINICAL DATA:  Fall this morning, increasing weakness. History of lymphoma. EXAM: PORTABLE CHEST 1 VIEW COMPARISON:  Chest x-rays dated 08/14/2018 and 07/12/2018. FINDINGS: Heart size and mediastinal contours are stable. Lungs are clear. No pleural  effusion or pneumothorax seen. RIGHT-sided PICC line appears appropriately position with tip at the level of the lower SVC. Osseous structures about the chest are unremarkable. IMPRESSION: No acute findings. No evidence of pneumonia or pulmonary edema. No osseous fracture or dislocation seen. Electronically Signed   By: Franki Cabot M.D.   On: 08/28/2018 11:35    Pending Labs [employer] (From admission, onward)    Start     Ordered   Signed and Held  Magnesium  Tomorrow morning,   R    Comments:  Call MD if <1.5    Signed and Held   Signed and Held  Phosphorus  Tomorrow morning,   R     Signed and Held   Signed and Held  TSH  Once,   R    Comments:  Cancel if already done within 1 month and notify MD    Signed and Held   Signed and Held  Comprehensive metabolic panel  Once,   R    Comments:  Cal MD for K<3.5 or >  5.0    Signed and Held   Signed and Held  CBC  Once,   R    Comments:  Call for hg <8.0    Signed and Held   Signed and Held  Prealbumin  Tomorrow morning,   R     Signed and Held   Signed and Held  Troponin I (q 6hr x 3)  Now then every 6 hours,   R     Signed and Held          Vitals/Pain Today's Vitals   08/29/18 2200 08/29/18 2218 08/29/18 2230 08/29/18 2235  BP: 139/63 136/63 (!) 146/63 (!) 142/62  Pulse: 82 86 88 88  Resp: (!) 21 (!) 24 (!) 22 (!) 24  Temp:  98.5 F (36.9 C)  98.6 F (37 C)  TempSrc:    Oral  SpO2: 100% 100% 100% 100%  PainSc:        Isolation Precautions No active isolations  Medications Medications  0.9 %  sodium chloride infusion (has no administration in time range)  iopamidol (ISOVUE-300) 61 % injection (has no administration in time range)  sodium chloride 0.9 % injection (has no administration in time range)  sodium chloride 0.9 % bolus 1,000 mL (0 mLs Intravenous Stopped 08/29/18 1841)  fentaNYL (SUBLIMAZE) injection 50 mcg (50 mcg Intravenous Given 08/29/18 2029)  iopamidol (ISOVUE-300) 61 % injection 100 mL (100 mLs  Intravenous Contrast Given 08/29/18 2237)    Mobility

## 2018-08-29 NOTE — ED Notes (Signed)
Report rec'd from previous RN. Pt is oriented x3, she c/o generalized pain 8/10, will admin pain meds for the same. Blood transfusion consent form has been signed, awaiting call from Blood Bank for availability. Resting, awaiting admitting provider assessment and orders.

## 2018-08-30 ENCOUNTER — Encounter (HOSPITAL_COMMUNITY): Payer: Self-pay | Admitting: Internal Medicine

## 2018-08-30 DIAGNOSIS — Z85828 Personal history of other malignant neoplasm of skin: Secondary | ICD-10-CM

## 2018-08-30 DIAGNOSIS — R531 Weakness: Secondary | ICD-10-CM

## 2018-08-30 DIAGNOSIS — E440 Moderate protein-calorie malnutrition: Secondary | ICD-10-CM

## 2018-08-30 LAB — COMPREHENSIVE METABOLIC PANEL
ALBUMIN: 2.1 g/dL — AB (ref 3.5–5.0)
ALK PHOS: 57 U/L (ref 38–126)
ALT: 10 U/L (ref 0–44)
AST: 12 U/L — AB (ref 15–41)
Anion gap: 10 (ref 5–15)
BUN: 11 mg/dL (ref 8–23)
CO2: 26 mmol/L (ref 22–32)
CREATININE: 0.76 mg/dL (ref 0.44–1.00)
Calcium: 8.1 mg/dL — ABNORMAL LOW (ref 8.9–10.3)
Chloride: 107 mmol/L (ref 98–111)
GFR calc Af Amer: >60 mL/min (ref >60)
GLUCOSE: 79 mg/dL (ref 70–99)
POTASSIUM: 3.7 mmol/L (ref 3.5–5.1)
Sodium: 143 mmol/L (ref 135–145)
Total Bilirubin: 1.7 mg/dL — ABNORMAL HIGH (ref 0.3–1.2)
Total Protein: 4.6 g/dL — ABNORMAL LOW (ref 6.5–8.1)

## 2018-08-30 LAB — CBC
HEMATOCRIT: 23.3 % — AB (ref 36.0–46.0)
Hemoglobin: 8.1 g/dL — ABNORMAL LOW (ref 12.0–15.0)
MCH: 29.9 pg (ref 26.0–34.0)
MCHC: 34.8 g/dL (ref 30.0–36.0)
MCV: 86 fL (ref 78.0–100.0)
PLATELETS: 133 10*3/uL — AB (ref 150–400)
RBC: 2.71 MIL/uL — ABNORMAL LOW (ref 3.87–5.11)
RDW: 17.7 % — AB (ref 11.5–15.5)
WBC: 2.9 10*3/uL — ABNORMAL LOW (ref 4.0–10.5)

## 2018-08-30 LAB — DIFFERENTIAL
BASOS ABS: 0 10*3/uL (ref 0.0–0.1)
BLASTS: 0 %
Band Neutrophils: 14 %
Band Neutrophils: 14 %
Basophils Relative: 0 %
Basophils Relative: 0 %
Blasts: 0 %
EOS PCT: 2 %
Eosinophils Absolute: 0.1 10*3/uL (ref 0.0–0.7)
Eosinophils Relative: 2 %
LYMPHS PCT: 11 %
LYMPHS PCT: 11 %
Lymphs Abs: 0.3 10*3/uL — ABNORMAL LOW (ref 0.7–4.0)
MONOS PCT: 17 %
Metamyelocytes Relative: 3 %
Metamyelocytes Relative: 3 %
Monocytes Absolute: 0.5 10*3/uL (ref 0.1–1.0)
Monocytes Relative: 17 %
Myelocytes: 2 %
Myelocytes: 2 %
NEUTROS PCT: 51 %
NRBC: 0 /100{WBCs}
NRBC: 0 /100{WBCs}
Neutro Abs: 2 10*3/uL (ref 1.7–7.7)
Neutrophils Relative %: 51 %
OTHER: 0 %
Other: 0 %
PROMYELOCYTES RELATIVE: 0 %
Promyelocytes Relative: 0 %

## 2018-08-30 LAB — C DIFFICILE QUICK SCREEN W PCR REFLEX
C DIFFICILE (CDIFF) INTERP: NOT DETECTED
C DIFFICILE (CDIFF) TOXIN: NEGATIVE
C DIFFICLE (CDIFF) ANTIGEN: NEGATIVE

## 2018-08-30 LAB — MAGNESIUM: Magnesium: 1.6 mg/dL — ABNORMAL LOW (ref 1.7–2.4)

## 2018-08-30 LAB — PREALBUMIN: Prealbumin: 10.4 mg/dL — ABNORMAL LOW (ref 18–38)

## 2018-08-30 LAB — TROPONIN I
Troponin I: <0.03 ng/mL (ref <0.03)
Troponin I: <0.03 ng/mL (ref <0.03)

## 2018-08-30 LAB — TSH: TSH: 1.239 u[IU]/mL (ref 0.350–4.500)

## 2018-08-30 LAB — PHOSPHORUS: Phosphorus: 3.6 mg/dL (ref 2.5–4.6)

## 2018-08-30 MED ORDER — SODIUM CHLORIDE 0.9% FLUSH
10.0000 mL | INTRAVENOUS | Status: DC | PRN
  Administered 2018-09-02: 10 mL
  Administered 2018-09-02: 20 mL
  Filled 2018-08-30 (×2): qty 40

## 2018-08-30 MED ORDER — VITAMIN B-1 100 MG PO TABS
100.0000 mg | ORAL_TABLET | Freq: Every day | ORAL | Status: DC
  Administered 2018-08-30 – 2018-09-02 (×4): 100 mg via ORAL
  Filled 2018-08-30 (×4): qty 1

## 2018-08-30 MED ORDER — MAGNESIUM SULFATE 2 GM/50ML IV SOLN
2.0000 g | Freq: Once | INTRAVENOUS | Status: AC
  Administered 2018-08-30: 2 g via INTRAVENOUS
  Filled 2018-08-30: qty 50

## 2018-08-30 MED ORDER — ADULT MULTIVITAMIN W/MINERALS CH
1.0000 | ORAL_TABLET | Freq: Every day | ORAL | Status: DC
  Administered 2018-08-30 – 2018-09-02 (×4): 1 via ORAL
  Filled 2018-08-30 (×4): qty 1

## 2018-08-30 MED ORDER — METOCLOPRAMIDE HCL 5 MG PO TABS
5.0000 mg | ORAL_TABLET | Freq: Three times a day (TID) | ORAL | Status: DC
  Administered 2018-08-30 – 2018-09-01 (×5): 5 mg via ORAL
  Filled 2018-08-30 (×5): qty 1

## 2018-08-30 MED ORDER — SODIUM CHLORIDE 0.9 % IV SOLN: INTRAVENOUS | Status: DC

## 2018-08-30 NOTE — Progress Notes (Signed)
IP PROGRESS NOTE  Subjective:   Erica Escobar is known to me with a recent diagnosis of non-Hodgkin's lymphoma.  She completed cycle 2 CVP/rituximab on 08/22/2018.  She received Neulasta on 08/23/2018. He was seen in the emergency room 08/28/2018 after a fall the report of generalized weakness.  She was discharged home.  She was noted to have a total white count of 0.2.  She presented to the emergency room again yesterday following another fall.  She was noted to have severe anemia with hemoglobin of 5.5.  The absolute neutrophil count returned at 0.3.  She was transfused packed red blood cells and admitted for further evaluation.  She was noted to have orthostasis. Erica Escobar reports feeling better today.  The back pain is much improved.  She complains of nausea when she begins to eat.  No emesis.  She has been afebrile.   Objective: Vital signs in last 24 hours: Blood pressure 131/63, pulse 72, temperature 97.9 F (36.6 C), temperature source Oral, resp. rate 17, height 5' 3"  (1.6 m), weight 151 lb 0.2 oz (68.5 kg), SpO2 96 %.  Intake/Output from previous day: 09/16 0701 - 09/17 0700 In: 1080.9 [P.O.:120; I.V.:323.4; Blood:637.5] Out: 500 [Urine:500]  Physical Exam:  HEENT: No thrush or ulcers Lungs: Clear bilaterally Cardiac: Regular rate and rhythm Abdomen: Mild diffuse tenderness, no mass, no hepatosplenomegaly Extremities: No leg edema Neurologic: Alert and oriented  Portacath/PICC-without erythema  Lab Results: Recent Labs    08/29/18 1700 08/30/18 0520  WBC 1.0* 2.9*  HGB 5.5* 8.1*  HCT 15.9* 23.3*  PLT 121* 133*    BMET Recent Labs    08/29/18 1700 08/30/18 0520  NA 139 143  K 3.6 3.7  CL 105 107  CO2 27 26  GLUCOSE 85 79  BUN 13 11  CREATININE 0.76 0.76  CALCIUM 8.0* 8.1*    Lab Results  Component Value Date   CEA1 2.2 07/12/2018    Studies/Results: Dg Abd 1 View  Result Date: 08/29/2018 CLINICAL DATA:  Generalize weakness EXAM: ABDOMEN - 1 VIEW  COMPARISON:  08/16/2018, CT 08/14/2018, PET-CT 08/10/2018 FINDINGS: Lung bases are clear. Surgical clips in the right upper quadrant. Nonobstructed gas pattern with scattered small and large bowel gas. Surgical changes in the right mid abdomen. No abnormal calcification. IMPRESSION: Nonobstructed bowel-gas pattern. Electronically Signed   By: Donavan Foil M.D.   On: 08/29/2018 21:52   Ct Head Wo Contrast  Result Date: 08/29/2018 CLINICAL DATA:  Generalized weakness with recent fall. EXAM: CT HEAD WITHOUT CONTRAST TECHNIQUE: Contiguous axial images were obtained from the base of the skull through the vertex without intravenous contrast. COMPARISON:  None. FINDINGS: Brain: There is no mass, hemorrhage or extra-axial collection. The size and configuration of the ventricles and extra-axial CSF spaces are normal. There is no acute or chronic infarction. There is hypoattenuation of the periventricular white matter, most commonly indicating chronic ischemic microangiopathy. Vascular: No abnormal hyperdensity of the major intracranial arteries or dural venous sinuses. No intracranial atherosclerosis. Skull: The visualized skull base, calvarium and extracranial soft tissues are normal. Sinuses/Orbits: Remote right mastoidectomy. The right sphenoid sinus is opacified. The orbits are normal. IMPRESSION: 1. No acute intracranial abnormality. 2. Chronic small vessel ischemia. Electronically Signed   By: Ulyses Jarred M.D.   On: 08/29/2018 19:15   Ct Cervical Spine Wo Contrast  Result Date: 08/29/2018 CLINICAL DATA:  Generalized weakness with fall. EXAM: CT CERVICAL SPINE WITHOUT CONTRAST TECHNIQUE: Multidetector CT imaging of the cervical spine was performed  without intravenous contrast. Multiplanar CT image reconstructions were also generated. COMPARISON:  08/14/2018. FINDINGS: Alignment: 3 mm of facet mediated anterolisthesis C7-T1, 1-2 mm of similar facet mediated slip C6-7. Skull base and vertebrae: No acute  fracture. No primary bone lesion or focal pathologic process. Soft tissues and spinal canal: No prevertebral fluid or swelling. No visible canal hematoma. Disc levels: No acute findings. Multilevel disc space narrowing. Facet disease. Upper chest:  No pneumothorax or visible mass. Other: Similar appearance to most recent priors. IMPRESSION: Cervical spondylosis without acute findings. Electronically Signed   By: Staci Righter M.D.   On: 08/29/2018 19:15   Ct Abdomen Pelvis W Contrast  Result Date: 08/29/2018 CLINICAL DATA:  82 year old female with history of non-Hodgkin's lymphoma, colon cancer status post partial colectomy presents with gradual onset of fatigue and generalized weakness. EXAM: CT ABDOMEN AND PELVIS WITH CONTRAST TECHNIQUE: Multidetector CT imaging of the abdomen and pelvis was performed using the standard protocol following bolus administration of intravenous contrast. CONTRAST:  176m ISOVUE-300 IOPAMIDOL (ISOVUE-300) INJECTION 61% COMPARISON:  08/14/2018 FINDINGS: Lower chest: Top-normal size heart without pericardial effusion. Small hiatal hernia. Trace bilateral pleural effusions with atelectasis. No pulmonary consolidation or suspicious pulmonary lesions. Hepatobiliary: Cholecystectomy. Homogeneous appearance of the liver without space-occupying mass. No biliary dilatation. Pancreas: Normal Spleen: Splenic granuloma.  Normal splenic size.  No enhancing mass. Adrenals/Urinary Tract: No adrenal mass. Cortical enhancement is symmetric. No obstructive uropathy. The urinary bladder is unremarkable. Stomach/Bowel: Stigmata of right partial colectomy. No bowel obstruction or inflammation. The stomach is decompressed in appearance. There is normal small bowel rotation. Colonic diverticulosis without acute diverticulitis is identified along the course of the descending and sigmoid colon. Probable small internal hemorrhoids accounting for smooth thickening of the anus and distal rectum.  Vascular/Lymphatic: Aortoiliac atherosclerosis. No adenopathy by CT size criteria. Reproductive: Hysterectomy.  No adnexal mass. Other: No free air nor free fluid. Musculoskeletal: Further decrease in right paraspinal soft tissue mass with adjacent lytic abnormality of the right tenth and eleventh ribs, currently 4.2 x 3.1 x 6.8 cm cm versus 3.6 x 5 x 8.6 cm at the same level previously. IMPRESSION: 1. Trace bilateral pleural effusions with atelectasis. 2. Smaller right paraspinal soft tissue mass with areas of lytic bone destruction redemonstrated involving the posterior right tenth and eleventh ribs. 3. No acute solid nor hollow visceral organ abnormality. Status post right partial colectomy. Electronically Signed   By: DAshley RoyaltyM.D.   On: 08/29/2018 22:58   Dg Chest Port 1 View  Result Date: 08/28/2018 CLINICAL DATA:  Fall this morning, increasing weakness. History of lymphoma. EXAM: PORTABLE CHEST 1 VIEW COMPARISON:  Chest x-rays dated 08/14/2018 and 07/12/2018. FINDINGS: Heart size and mediastinal contours are stable. Lungs are clear. No pleural effusion or pneumothorax seen. RIGHT-sided PICC line appears appropriately position with tip at the level of the lower SVC. Osseous structures about the chest are unremarkable. IMPRESSION: No acute findings. No evidence of pneumonia or pulmonary edema. No osseous fracture or dislocation seen. Electronically Signed   By: SFranki CabotM.D.   On: 08/28/2018 11:35    Medications: I have reviewed the patient's current medications.  Assessment/Plan: 1. 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 Loss of MLH1 and PMS2, MSI-high, BRAF mutation detected ? Colonoscopy February 2017 ? Colonoscopy 12/16/2017-3 mm polyp in the transverse colon, 10 mm polyp in the sigmoid colon. Diverticulosis in the sigmoid colon. Transverse colon polyp, tubular adenoma;  sigmoid colon polyp, tubular  adenoma. ? CT 07/12/2018-probable subcutaneous metastasis at the left abdominal wall, left perinephric mass, destructive thoracic/lumbar paraspinous mass ? CT-guided biopsy of the paraspinous mass 07/13/2018  2. Anemia-likely secondary to bleeding from the colon cancer and surgery. Hemoglobin in normal range 03/20/2016, mild persistent anemia 3. Ascending colon "polyps" removed on the colonoscopy 12/22/2014 with the pathology revealing poorly differentiated adenocarcinoma and a sessile serrated adenoma polyp, similar to the pathology on the hepatic flexure mass 4. History of multiple skin cancers. Seen by dermatology every 3 months. 5. Glaucoma 6.Non-Hodgkin's lymphoma presenting with severe back pain  CTabdomen/pelvis7/30/2019-large destructive right paraspinal chest wall mass lower thoracic/upper lumbar spinewith associated bone destruction involving the right posterior elements of T9, T10 and T11, spinous processes of T8 and T9, and the posterior right 10th and 11th ribs. Probable subcutaneous metastasis anterior left abdominal wall 2.2 cm; enlarging mass in the left perinephric space 3.0 cm in size.  CT L-spine 07/12/2018-paraspinous soft tissue mass on the right at the lower thoracic spine causing destruction of the right T11 and 12 transverse processes; may encroach on the right T11 neural foramen.  CT-guided biopsy paraspinous mass 07/13/2018-poorly differentiated malignant neoplasm; CD45, CD20,bcl-6 andbcl-2positive. 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  Radiationto paraspinous massinitiated 07/14/2018  Cycle 1 CVP/Rituxan 08/02/2018 08/29/2018-decreased size of right paraspinal soft tissue mass,  PET scan 08/10/2018- relatively mild metabolic activity within the right paraspinal mass which is decreased significantly in size from the comparison exam. No evidence of lymphoma elsewhere. Focal activity within the distal  esophagus above the GE junction without mass lesion or obstruction.  Cycle 2 CVP/Rituxan 08/22/2018, udenyca added  CT abdomen/pelvis- decrease in size of the right paraspinal soft tissue mass with adjacent lytic abnormality in the right posterior 10th and 11th ribs. 7.Anorexia/weight loss 8.Mild elevation of calcium-questionhypercalcemia of malignancy 9.3 of severe neutropeniaand anemiasecondary to radiation and chemotherapy; udenyca added with cycle 2 CVP/Rituxan  10.  Admission 08/29/2018 with failure to thrive and severe anemia/neutropenia, status post red cell transfusions on 08/30/2018  Erica Escobar has non-Hodgkin's lymphoma.  She is currently at day 9 following cycle 2 CVP-rituximab.  She has severe anemia/neutropenia secondary to chemotherapy and recently completed radiation.  The hemoglobin is improved following a red cell transfusion and the white count appears to be recovering.  There is no clinical or x-ray evidence of an infection.  The severe anemia is most likely secondary to chemotherapy/radiation, chronic disease, and malnutrition.  No report of gross bleeding.  Ms. Mayo has generalized weakness, likely secondary to deconditioning from the illness over the past several months and malnutrition.  The etiology of the nausea is unclear.  It is unusual to have significant nausea with the CVP-rituximab regimen.  I discussed the situation with her family.  I think she would be a good candidate for an inpatient rehabilitation stay with the goal for her to return home independently.  We will plan to continue systemic therapy based on her recovery over the next few weeks.  Recommendations: 1.  Trial of Reglan for nausea 2.  Check white cell differential today 3.  CBC with differential in the a.m. 08/31/2018 4.  Rehabilitation medicine consult to consider inpatient rehabilitation stay 5.  [place] office visit at [facility] 09/01/2018, keep scheduled appointment on  09/12/2018      LOS: 0 days   Betsy Coder, MD   08/30/2018, 7:28 AM

## 2018-08-30 NOTE — Evaluation (Signed)
Physical Therapy Evaluation Patient Details Name: Erica Escobar MRN: [id number] DOB: 09-18-1936 Today's Date: 08/30/2018   History of Present Illness  Pt is an 82 year old female with PMH of B-cell lymphoma  receiving chemotherapy, colon cancer S/P colectomy, GERD, COPD, HLD, depression, large soft tissue mass posterior chest wall with related bony destruction left 11th rib fracture, pathological fractures T8-9, destruction of right T11 and 12 transverse processes; admitted on 08/29/2018, presented with complaint of fatigue and tiredness and dizziness. Pt admitted to ED 1 day prior post-fall in home and stated then that she feels she can't take care of herself at home. Pt with SNF stay recently.   Clinical Impression   Pt's admission information and PMH as listed above. Pt presents with decreased activity tolerance, general deconditioning, some dyspnea after ambulation, and difficulty walking in room due to fatigue. Pt only able to ambulate 2x5 ft with RW in room today, limited by fatigue and weakness. Pt to benefit from acute PT to address deficits. PT suggesting SNF placement for deficits, and due to fact that pt lives alone.     Follow Up Recommendations SNF;Supervision for mobility/OOB    Equipment Recommendations  None recommended by PT    Recommendations for Other Services       Precautions / Restrictions Precautions Precautions: Fall Restrictions Weight Bearing Restrictions: No      Mobility  Bed Mobility               General bed mobility comments: pt up in recliner upon PT arrival to room  Transfers Overall transfer level: Needs assistance Equipment used: Rolling walker (2 wheeled) Transfers: Sit to/from Stand Sit to Stand: Min assist;Min guard         General transfer comment: Min assist for initial rise, pt min guard for self-steadying. Verbal cuing for hand placement when rising from bed. sit to stand x2, once from bed and once from bedside commode.    Ambulation/Gait Ambulation/Gait assistance: Min guard Gait Distance (Feet): 10 Feet(2x5 ft ) Assistive device: Rolling walker (2 wheeled) Gait Pattern/deviations: Step-through pattern;Decreased stride length;Trunk flexed Gait velocity: very decr    General Gait Details: Min guard for safety. Verbal cuing to step into RW. Pt with 5 ft ambulation to [facility], presented with some fatigue after first 5 ft. After using BSC, pt ambulated 5 ft and reported her LEs felt "wobbly" and "shakey", so ambulated 5 ft to RW. Pt with some knee buckling, self-recovered by pt. After sitting in recliner, pt stated she felt dizzy and nauseous even though she did not tell PT when asked during mobility. Pt states that she likes to push through it.   Stairs            Wheelchair Mobility    Modified Rankin (Stroke Patients Only)       Balance Overall balance assessment: History of Falls;Needs assistance(Pt states "I lost count" ) Sitting-balance support: Feet supported Sitting balance-Leahy Scale: Fair       Standing balance-Leahy Scale: Poor Standing balance comment: relies on RW and PT for balance and support                              Pertinent Vitals/Pain Pain Assessment: 0-10 Pain Score: 7  Faces Pain Scale: Hurts little more Pain Location: hurt all over from fall, R back and gluteal region  Pain Descriptors / Indicators: Sore;Aching Pain Intervention(s): Limited activity within patient's tolerance;Repositioned;Monitored during session  Home Living Family/patient expects to be discharged to:: Private residence Living Arrangements: Alone   Type of Home: Mobile home Home Access: [place]: One level Home Equipment: [place] - 4 wheels;Wheelchair - [employer];Shower seat      Prior Function Level of Independence: Independent with assistive device(s)         Comments: uses rollator to walk around, uses wheelchair for longer distances  like at the cancer center. Pt states she stays at home.      Hand Dominance   Dominant Hand: Right    Extremity/Trunk Assessment   Upper Extremity Assessment Upper Extremity Assessment: Generalized weakness    Lower Extremity Assessment Lower Extremity Assessment: Generalized weakness;RLE deficits/detail;LLE deficits/detail RLE Deficits / Details: Pt states burning in bilat feet, onset around chemotherapy LLE Deficits / Details: Pt states burning in bilat feet, onset around chemotherapy       Communication   Communication: No difficulties  Cognition Arousal/Alertness: Awake/alert Behavior During Therapy: WFL for tasks assessed/performed Overall Cognitive Status: Within Functional Limits for tasks assessed                                        General Comments      Exercises     Assessment/Plan    PT Assessment Patient needs continued PT services  PT Problem List Decreased strength;Decreased mobility;Decreased activity tolerance;Decreased balance;Decreased knowledge of use of DME;Pain;Decreased safety awareness       PT Treatment Interventions DME instruction;Therapeutic activities;Functional mobility training;Balance training;Patient/family education;Therapeutic exercise;Gait training    PT Goals (Current goals can be found in the Care Plan section)  Acute Rehab PT Goals PT Goal Formulation: With patient Time For Goal Achievement: 09/13/18 Potential to Achieve Goals: Good    Frequency Min 2X/week   Barriers to discharge        Co-evaluation               AM-PAC PT "6 Clicks" Daily Activity  Outcome Measure Difficulty turning over in bed (including adjusting bedclothes, sheets and blankets)?: A Little Difficulty moving from lying on back to sitting on the side of the bed? : A Little Difficulty sitting down on and standing up from a chair with arms (e.g., wheelchair, bedside commode, etc,.)?: Unable Help needed moving to and from a  bed to chair (including a wheelchair)?: A Little Help needed walking in hospital room?: A Little Help needed climbing 3-5 steps with a railing? : A Lot 6 Click Score: 15    End of Session Equipment Utilized During Treatment: Gait belt Activity Tolerance: Patient limited by fatigue Patient left: in chair;with chair alarm set;with call bell/phone within reach;with nursing/sitter in room Nurse Communication: Mobility status PT Visit Diagnosis: Other abnormalities of gait and mobility (R26.89);History of falling (Z91.81)    Time: 1660-6004 PT Time Calculation (min) (ACUTE ONLY): 26 min   Charges:   PT Evaluation $PT Eval Low Complexity: 1 Low PT Treatments $Therapeutic Activity: 8-22 mins        Aaronmichael Brumbaugh Conception Chancy, PT Acute Rehabilitation Services Pager [phone]  Office [phone]   Camari Quintanilla D Talor Cheema 08/30/2018, 2:01 PM

## 2018-08-30 NOTE — Care Management Obs Status (Signed)
[place] NOTIFICATION   Patient Details  Name: Erica Escobar MRN: [id number] Date of Birth: 1936/01/22   Medicare Observation Status Notification Given:   yes    Purcell Mouton, RN 08/30/2018, 2:17 PM

## 2018-08-30 NOTE — Progress Notes (Addendum)
PROGRESS NOTE    Erica Escobar  PPJ:[id number] DOB: 04-27-1936 DOA: 08/29/2018 PCP: Deland Pretty, MD   Brief Narrative: Patient is a 82 year old female with past medical history of CKD, hyperlipidemia, large cell lymphoma, colon cancer status post colectomy, COPD, GERD who presented from home with complaints of generalized weakness and frequent falls.  Patient was found to be orthostatic on presentation.  Her hemoglobin was found to be in the range of 5 on admission.  Patient was recently discharged from here after being managed for dehydration,AKI, orthostatic hypotension, severe constipation.  Patient has history of chronic intermittent rectal bleeding secondary to hemorrhoids.  Patient admitted for the further evaluation of anemia, generalized weakness.  Assessment & Plan:   Principal Problem:   Weakness Active Problems:   Cancer of ascending colon s/p robotic colectomy 02/15/2015   COPD (chronic obstructive pulmonary disease) (HCC)   Large cell lymphoma (HCC)   CKD (chronic kidney disease), stage III (HCC)   Hyperlipidemia   FTT (failure to thrive) in adult   Symptomatic anemia   Malnutrition of moderate degree  Generalized weakness/frequent falls/deconditioning/debility: Most likely multifactorial .  Secondary to anemia, orthostatic hypotension.  Will request for physical therapy evaluation.  She might require rehab on discharge.  Severe normocytic  anemia: Hemoglobin found to be 5.5 on admission.  She has history of chronic intermittent rectal bleeding secondary to hemorrhoids.  FOBT done in the emergency department was negative.  Her anemia is also associated with lymphoma and chemotherapy.  She was transfused with 3 units of PRBC on admission.  Her hemoglobin this morning is is 8.1.  Orthostatic hypotension: Noted to be orthostatic on presentation.  Continue gentle IV fluids.  Check orthostatic vitals tomorrow morning.  Large B-cell lymphoma: Follows with oncology.  Has history  of large B-cell lymphoma with metastatic spread.  She has a PICC line on her right arm.  She is currently undergoing radiation therapy and also on chemotherapy.  Follows with Dr. Ammie Dalton. She has H/O ascending colon cancer and is status post robotic colectomy in 3/16.  CKD stage III: Currently kidney function is on baseline.  Avoid nephrotoxic medications.  History of COPD: Currently stable.  Continue PRN bronchodilators  Failure to thrive: Nutrition consulted.  Hyperlipidemia: Continue home gemfibrozil.  Hypomagnesemia: Supplemented with magnesium.   DVT prophylaxis: SCD Code Status: Full Family Communication: None present at the bedside Disposition Plan: Depending upon PT evaluation.   Consultants: None  Procedures: None  Antimicrobials: None  Subjective: Patient seen and examined the bedside this morning.  Remains hemodynamically stable today.  She feels much better this morning.  Complains of generalized ache and weakness.  Objective: Vitals:   08/30/18 0320 08/30/18 0451 08/30/18 0545 08/30/18 0956  BP: (!) 145/69 (!) 134/94 131/63 (!) 145/62  Pulse: 74 (!) 102 72 80  Resp: 18 16 17 18   Temp: 98.6 F (37 C) 98.7 F (37.1 C) 97.9 F (36.6 C) 98.1 F (36.7 C)  TempSrc: Axillary Oral Oral Oral  SpO2: 95% 100% 96%   Weight:      Height:        Intake/Output Summary (Last 24 hours) at 08/30/2018 1321 Last data filed at 08/30/2018 0900 Gross per 24 hour  Intake 1320.94 ml  Output 800 ml  Net 520.94 ml   Filed Weights   08/29/18 2321  Weight: 68.5 kg    Examination:  General exam: Not in distress, generalized weakness, elderly female HEENT:PERRL,Oral mucosa moist, Ear/Nose normal on gross exam Respiratory system: Bilateral  equal air entry, normal vesicular breath sounds, no wheezes or crackles  Cardiovascular system: S1 & S2 heard, RRR. No JVD, murmurs, rubs, gallops or clicks. No pedal edema. Gastrointestinal system: Abdomen is nondistended, soft and  nontender. No organomegaly or masses felt. Normal bowel sounds heard.  Old surgical scars Central nervous system: Alert and oriented. No focal neurological deficits. Extremities: No edema, no clubbing ,no cyanosis, distal peripheral pulses palpable.  PICC line on the right Skin: No rashes, lesions or ulcers,no icterus ,no pallor    Data Reviewed: I have personally reviewed following labs and imaging studies  CBC: Recent Labs  Lab 08/28/18 0801 08/29/18 1700 08/30/18 0520  WBC 0.2* 1.0* 2.9*  NEUTROABS 0.0* 0.3*  --   HGB 8.3* 5.5* 8.1*  HCT 24.0* 15.9* 23.3*  MCV 89.2 88.8 86.0  PLT 104* 121* 263*   Basic Metabolic Panel: Recent Labs  Lab 08/28/18 0801 08/29/18 1700 08/30/18 0520  NA 139 139 143  K 3.6 3.6 3.7  CL 105 105 107  CO2 26 27 26   GLUCOSE 101* 85 79  BUN 24* 13 11  CREATININE 0.83 0.76 0.76  CALCIUM 8.0* 8.0* 8.1*  MG  --   --  1.6*  PHOS  --   --  3.6   GFR: Estimated Creatinine Clearance: 52.1 mL/min (by C-G formula based on SCr of 0.76 mg/dL). Liver Function Tests: Recent Labs  Lab 08/30/18 0520  AST 12*  ALT 10  ALKPHOS 57  BILITOT 1.7*  PROT 4.6*  ALBUMIN 2.1*   No results for input(s): LIPASE, AMYLASE in the last 168 hours. No results for input(s): AMMONIA in the last 168 hours. Coagulation Profile: No results for input(s): INR, PROTIME in the last 168 hours. Cardiac Enzymes: Recent Labs  Lab 08/29/18 2036 08/30/18 0520  TROPONINI 0.03* <0.03   BNP (last 3 results) No results for input(s): PROBNP in the last 8760 hours. HbA1C: No results for input(s): HGBA1C in the last 72 hours. CBG: No results for input(s): GLUCAP in the last 168 hours. Lipid Profile: No results for input(s): CHOL, HDL, LDLCALC, TRIG, CHOLHDL, LDLDIRECT in the last 72 hours. Thyroid Function Tests: Recent Labs    08/30/18 0520  TSH 1.239   Anemia Panel: Recent Labs    08/29/18 2036  VITAMINB12 944*  FOLATE 11.1  FERRITIN 820*  TIBC 182*  IRON 34    RETICCTPCT 3.9*   Sepsis Labs: No results for input(s): PROCALCITON, LATICACIDVEN in the last 168 hours.  No results found for this or any previous visit (from the past 240 hour(s)).       Radiology Studies: Dg Abd 1 View  Result Date: 08/29/2018 CLINICAL DATA:  Generalize weakness EXAM: ABDOMEN - 1 VIEW COMPARISON:  08/16/2018, CT 08/14/2018, PET-CT 08/10/2018 FINDINGS: Lung bases are clear. Surgical clips in the right upper quadrant. Nonobstructed gas pattern with scattered small and large bowel gas. Surgical changes in the right mid abdomen. No abnormal calcification. IMPRESSION: Nonobstructed bowel-gas pattern. Electronically Signed   By: Donavan Foil M.D.   On: 08/29/2018 21:52   Ct Head Wo Contrast  Result Date: 08/29/2018 CLINICAL DATA:  Generalized weakness with recent fall. EXAM: CT HEAD WITHOUT CONTRAST TECHNIQUE: Contiguous axial images were obtained from the base of the skull through the vertex without intravenous contrast. COMPARISON:  None. FINDINGS: Brain: There is no mass, hemorrhage or extra-axial collection. The size and configuration of the ventricles and extra-axial CSF spaces are normal. There is no acute or chronic infarction. There is  hypoattenuation of the periventricular white matter, most commonly indicating chronic ischemic microangiopathy. Vascular: No abnormal hyperdensity of the major intracranial arteries or dural venous sinuses. No intracranial atherosclerosis. Skull: The visualized skull base, calvarium and extracranial soft tissues are normal. Sinuses/Orbits: Remote right mastoidectomy. The right sphenoid sinus is opacified. The orbits are normal. IMPRESSION: 1. No acute intracranial abnormality. 2. Chronic small vessel ischemia. Electronically Signed   By: Ulyses Jarred M.D.   On: 08/29/2018 19:15   Ct Cervical Spine Wo Contrast  Result Date: 08/29/2018 CLINICAL DATA:  Generalized weakness with fall. EXAM: CT CERVICAL SPINE WITHOUT CONTRAST TECHNIQUE:  Multidetector CT imaging of the cervical spine was performed without intravenous contrast. Multiplanar CT image reconstructions were also generated. COMPARISON:  08/14/2018. FINDINGS: Alignment: 3 mm of facet mediated anterolisthesis C7-T1, 1-2 mm of similar facet mediated slip C6-7. Skull base and vertebrae: No acute fracture. No primary bone lesion or focal pathologic process. Soft tissues and spinal canal: No prevertebral fluid or swelling. No visible canal hematoma. Disc levels: No acute findings. Multilevel disc space narrowing. Facet disease. Upper chest:  No pneumothorax or visible mass. Other: Similar appearance to most recent priors. IMPRESSION: Cervical spondylosis without acute findings. Electronically Signed   By: Staci Righter M.D.   On: 08/29/2018 19:15   Ct Abdomen Pelvis W Contrast  Result Date: 08/29/2018 CLINICAL DATA:  82 year old female with history of non-Hodgkin's lymphoma, colon cancer status post partial colectomy presents with gradual onset of fatigue and generalized weakness. EXAM: CT ABDOMEN AND PELVIS WITH CONTRAST TECHNIQUE: Multidetector CT imaging of the abdomen and pelvis was performed using the standard protocol following bolus administration of intravenous contrast. CONTRAST:  142m ISOVUE-300 IOPAMIDOL (ISOVUE-300) INJECTION 61% COMPARISON:  08/14/2018 FINDINGS: Lower chest: Top-normal size heart without pericardial effusion. Small hiatal hernia. Trace bilateral pleural effusions with atelectasis. No pulmonary consolidation or suspicious pulmonary lesions. Hepatobiliary: Cholecystectomy. Homogeneous appearance of the liver without space-occupying mass. No biliary dilatation. Pancreas: Normal Spleen: Splenic granuloma.  Normal splenic size.  No enhancing mass. Adrenals/Urinary Tract: No adrenal mass. Cortical enhancement is symmetric. No obstructive uropathy. The urinary bladder is unremarkable. Stomach/Bowel: Stigmata of right partial colectomy. No bowel obstruction or  inflammation. The stomach is decompressed in appearance. There is normal small bowel rotation. Colonic diverticulosis without acute diverticulitis is identified along the course of the descending and sigmoid colon. Probable small internal hemorrhoids accounting for smooth thickening of the anus and distal rectum. Vascular/Lymphatic: Aortoiliac atherosclerosis. No adenopathy by CT size criteria. Reproductive: Hysterectomy.  No adnexal mass. Other: No free air nor free fluid. Musculoskeletal: Further decrease in right paraspinal soft tissue mass with adjacent lytic abnormality of the right tenth and eleventh ribs, currently 4.2 x 3.1 x 6.8 cm cm versus 3.6 x 5 x 8.6 cm at the same level previously. IMPRESSION: 1. Trace bilateral pleural effusions with atelectasis. 2. Smaller right paraspinal soft tissue mass with areas of lytic bone destruction redemonstrated involving the posterior right tenth and eleventh ribs. 3. No acute solid nor hollow visceral organ abnormality. Status post right partial colectomy. Electronically Signed   By: DAshley RoyaltyM.D.   On: 08/29/2018 22:58        Scheduled Meds: . sodium chloride   Intravenous Once  . febuxostat  40 mg Oral Daily  . feeding supplement (ENSURE ENLIVE)  237 mL Oral BID BM  . gemfibrozil  600 mg Oral q morning - 10a  . mirtazapine  7.5 mg Oral QHS  . multivitamin with minerals  1 tablet Oral  Daily  . pantoprazole  40 mg Oral Daily  . thiamine  100 mg Oral Daily   Continuous Infusions: . sodium chloride       LOS: 0 days    Time spent: 25 mins.More than 50% of that time was spent in counseling and/or coordination of care.      Shelly Coss, MD [facility] Pager [phone]  If 7PM-7AM, please contact night-coverage [email or web address] Password [email or web address] 08/30/2018, 1:21 PM

## 2018-08-30 NOTE — Progress Notes (Signed)
Inpatient Rehabilitation Admissions Coordinator  Requested by Dr. Benay Spice to assess pt for a possible inpt rehab admission. On review of chart, noted 08/11/18 Erica Escobar, SW extensive discussions with patient and son on care options long term as pt lives alone. Pt was at [facility] for 29 days and did not choose to return on last admission due to cost. Patient and son were advised to apply for medicaid to look into coverage for ALF care and also respite care as needed. Discharged from [facility] 08/14/2018. Patient not at a level to be able to tolerate the intensity of an inpt rehab admission and a long term solution is needed for her 24/7 care. Recommend Palliative goals of care discussions. Please call me with any questions.  Danne Baxter, RN, MSN Rehab Admissions Coordinator [phone] 08/30/2018 5:06 PM

## 2018-08-30 NOTE — Progress Notes (Signed)
Initial Nutrition Assessment  DOCUMENTATION CODES:   Non-severe (moderate) malnutrition in context of chronic illness  INTERVENTION:    Ensure Enlive po BID, each supplement provides 350 kcal and 20 grams of protein  Carnation Instant Breakfast PO BID, each supplement provides 220 kcal and 13 grams of protein.   Provide MVI daily  NUTRITION DIAGNOSIS:   Moderate Malnutrition related to chronic illness, cancer and cancer related treatments as evidenced by energy intake < 75% for > or equal to 1 month, percent weight loss.  GOAL:   Patient will meet greater than or equal to 90% of their needs  MONITOR:   PO intake, Supplement acceptance, Weight trends, Labs, I & O's  REASON FOR ASSESSMENT:   Consult Assessment of nutrition requirement/status  ASSESSMENT:   Patient with PMH significant for B-cell lymphoma currently undergoing XRT/chemo, followed by Dr. Learta Codding, colon cancer s/p partial colectomy, GERD, COPD, hyperlipidemia, and diverticulosis. Recently admitted 9/3 for AKI on CKD III, recurrent falls, and failure to thrive.  Presents this admission with fall secondary to progressive generalized weakness and symptomatic anemia.    Pt reports her appetite did not progress once discharged 2 weeks ago. States she experinced taste changes upon eating anything tat home and describes food as "bland." She will eat one meal daily if her daughter brings her something (usually a subway sandwich). She consumes one carnation instant breakfast with 2-3 Ensures daily. She continues to have issues swallowing drier foods. Breakfast charted as 25% completed (eggs and oatmeal). Pt denies any nausea/vomiting. RD encouraged PO intake and supplement intake once discharged. Will provide supplements similar to home regimen.   UBW stated as 160 lb last admission. Pt unsure if she has lost weight, but feels like she has. Records indicate pt weighed 166 lb on 05/03/18 and 151 lb this admission (9% wt loss in  5 months, significant for time frame). Nutrition-Focused physical exam completed. Unable to assess pt's legs due to pt eating with bedside table over them.   Medications reviewed and include: Remeron once daily, thiamine Labs reviewed: Mg 1.6 (L)  NUTRITION - FOCUSED PHYSICAL EXAM:    Most Recent Value  Orbital Region  No depletion  Upper Arm Region  No depletion  Thoracic and Lumbar Region  No depletion  Buccal Region  No depletion  Temple Region  No depletion  Clavicle Bone Region  No depletion  Clavicle and Acromion Bone Region  No depletion  Scapular Bone Region  No depletion  Dorsal Hand  No depletion  Patellar Region  Unable to assess  Anterior Thigh Region  Unable to assess  Posterior Calf Region  Unable to assess  Edema (RD Assessment)  Unable to assess     Diet Order:   Diet Order            Diet Heart Room service appropriate? Yes; Fluid consistency: Thin  Diet effective now              EDUCATION NEEDS:   Education needs have been addressed  Skin:  Skin Assessment: Skin Integrity Issues: Skin Integrity Issues:: Stage II Stage II: sacrum  Last BM:  08/30/18  Height:   Ht Readings from Last 1 Encounters:  08/29/18 5' 3"  (1.6 m)    Weight:   Wt Readings from Last 1 Encounters:  08/29/18 68.5 kg    Ideal Body Weight:  52.3 kg  BMI:  Body mass index is 26.75 kg/m.  Estimated Nutritional Needs:   Kcal:  1700-1900 kcal  Protein:  80-95 grams   Fluid:  >/= 1.7 L/day   Mariana Single RD, LDN Clinical Nutrition Pager # [phone]

## 2018-08-30 NOTE — Evaluation (Addendum)
Occupational Therapy Evaluation Patient Details Name: Erica Escobar MRN: [id number] DOB: Jan 23, 1936 Today's Date: 08/30/2018    History of Present Illness Pt is an 82 year old female with PMH of B-cell lymphoma  receiving chemotherapy, colon cancer S/P colectomy, GERD, COPD, HLD, depression, large soft tissue mass posterior chest wall with related bony destruction left 11th rib fracture, pathological fractures T8-9, destruction of right T11 and 12 transverse processes; admitted on 08/29/2018, presented with complaint of fatigue and tiredness and dizziness. Pt admitted to ED 1 day prior post-fall in home and stated then that she feels she can't take care of herself at home. Pt with SNF stay recently.    Clinical Impression   Pt admitted with the above. Pt currently with functional limitations due to the deficits listed below (see OT Problem List).  Pt will benefit from skilled OT to increase their safety and independence with ADL and functional mobility for ADL to facilitate discharge to venue listed below.      Follow Up Recommendations  SNF;Home health OT;Supervision/Assistance - 24 hour    Equipment Recommendations  3 in 1 bedside commode    Recommendations for Other Services       Precautions / Restrictions Precautions Precautions: Fall Restrictions Weight Bearing Restrictions: No      Mobility Bed Mobility Overal bed mobility: Needs Assistance Bed Mobility: Sidelying to Sit;Rolling Rolling: Min guard Sidelying to sit: Supervision;Min assist       General bed mobility comments: pt up in recliner upon PT arrival to room  Transfers Overall transfer level: Needs assistance Equipment used: Rolling walker (2 wheeled) Transfers: Sit to/from [facility] Sit to Stand: Mod assist;+2 safety/equipment Stand pivot transfers: Mod assist;+2 safety/equipment       General transfer comment: Min assist for initial rise, pt min guard for self-steadying. Verbal cuing  for hand placement when rising from bed. sit to stand x2, once from bed and once from bedside commode.     Balance Overall balance assessment: History of Falls;Needs assistance(Pt states "I lost count" ) Sitting-balance support: Feet supported Sitting balance-Leahy Scale: Fair       Standing balance-Leahy Scale: Poor Standing balance comment: relies on RW and PT for balance and support                            ADL either performed or assessed with clinical judgement   ADL Overall ADL's : Needs assistance/impaired Eating/Feeding: Set up;Sitting       Upper Body Bathing: Set up;Sitting       Upper Body Dressing : Minimal assistance;Sitting       Toilet Transfer: Moderate assistance;Stand-pivot;RW;BSC;+2 for safety/equipment   Toileting- Clothing Manipulation and Hygiene: Cueing for sequencing;Cueing for safety;Maximal assistance;+2 for safety/equipment;Sit to/from stand         General ADL Comments: Pt lives alone- discussed need for post acute rehab.  Pt is not on board with this idea but will think about it at this time                  Pertinent Vitals/Pain Pain Assessment: 0-10 Pain Score: 7  Faces Pain Scale: Hurts little more Pain Location: hurt all over from fall, R back and gluteal region  Pain Descriptors / Indicators: Sore;Aching Pain Intervention(s): Limited activity within patient's tolerance;Repositioned;Monitored during session     Hand Dominance Right   Extremity/Trunk Assessment Upper Extremity Assessment Upper Extremity Assessment: Generalized weakness   Lower Extremity Assessment Lower  Extremity Assessment: Generalized weakness;RLE deficits/detail;LLE deficits/detail RLE Deficits / Details: Pt states burning in bilat feet, onset around chemotherapy LLE Deficits / Details: Pt states burning in bilat feet, onset around chemotherapy       Communication Communication Communication: No difficulties   Cognition  Arousal/Alertness: Awake/alert Behavior During Therapy: WFL for tasks assessed/performed Overall Cognitive Status: Within Functional Limits for tasks assessed                                                Home Living Family/patient expects to be discharged to:: Private residence Living Arrangements: Alone   Type of Home: Mobile home Home Access: Ramped entrance     [place]: One level         Bathroom Toilet: Handicapped height     Home Equipment: Environmental consultant - 4 wheels;Wheelchair - [employer];Shower seat          Prior Functioning/Environment Level of Independence: Independent with assistive device(s)        Comments: uses rollator to walk around, uses wheelchair for longer distances like at the cancer center. Pt states she stays at home.         OT Problem List: Decreased strength;Decreased activity tolerance;Decreased knowledge of precautions;Decreased safety awareness;Impaired balance (sitting and/or standing);Decreased knowledge of use of DME or AE      OT Treatment/Interventions: Self-care/ADL training;Patient/family education;DME and/or AE instruction;Therapeutic activities    OT Goals(Current goals can be found in the care plan section) Acute Rehab OT Goals Patient Stated Goal: home if I can OT Goal Formulation: With patient Time For Goal Achievement: 09/06/18 Potential to Achieve Goals: Good ADL Goals Pt Will Perform Grooming: with modified independence;standing Pt Will Perform Lower Body Dressing: with supervision;sit to/from stand Pt Will Transfer to Toilet: with supervision;stand pivot transfer;bedside commode Pt Will Perform Toileting - Clothing Manipulation and hygiene: with supervision;sit to/from stand  OT Frequency: Min 2X/week   Barriers to D/C: Decreased caregiver support             AM-PAC PT "6 Clicks" Daily Activity     Outcome Measure Help from another person eating meals?: None Help from another  person taking care of personal grooming?: A Little Help from another person toileting, which includes using toliet, bedpan, or urinal?: A Lot Help from another person bathing (including washing, rinsing, drying)?: A Lot Help from another person to put on and taking off regular upper body clothing?: A Little Help from another person to put on and taking off regular lower body clothing?: A Lot 6 Click Score: 16   End of Session Equipment Utilized During Treatment: Rolling walker;Gait belt Nurse Communication: Mobility status  Activity Tolerance: Patient tolerated treatment well Patient left: in chair;with call bell/phone within reach;with nursing/sitter in room  OT Visit Diagnosis: Unsteadiness on feet (R26.81);Repeated falls (R29.6);History of falling (Z91.81);Other abnormalities of gait and mobility (R26.89);Muscle weakness (generalized) (M62.81);Pain                Time: 8786-7672 OT Time Calculation (min): 38 min Charges:  Mod eval and 1 [place]   Kari Baars, [place] Acute Rehabilitation Services [phone] Office- [phone], Edwena Felty D 08/30/2018, 2:06 PM

## 2018-08-31 ENCOUNTER — Encounter (HOSPITAL_COMMUNITY): Payer: Self-pay

## 2018-08-31 DIAGNOSIS — C182 Malignant neoplasm of ascending colon: Secondary | ICD-10-CM

## 2018-08-31 DIAGNOSIS — E785 Hyperlipidemia, unspecified: Secondary | ICD-10-CM

## 2018-08-31 DIAGNOSIS — N183 Chronic kidney disease, stage 3 (moderate): Secondary | ICD-10-CM

## 2018-08-31 DIAGNOSIS — E440 Moderate protein-calorie malnutrition: Secondary | ICD-10-CM

## 2018-08-31 DIAGNOSIS — I959 Hypotension, unspecified: Secondary | ICD-10-CM

## 2018-08-31 DIAGNOSIS — C8580 Other specified types of non-Hodgkin lymphoma, unspecified site: Secondary | ICD-10-CM

## 2018-08-31 LAB — CBC WITH DIFFERENTIAL/PLATELET
BASOS PCT: 0 %
Basophils Absolute: 0 10*3/uL (ref 0.0–0.1)
Basophils Absolute: 0.1 10*3/uL (ref 0.0–0.1)
Basophils Relative: 1 %
EOS ABS: 0 10*3/uL (ref 0.0–0.7)
EOS PCT: 0 %
Eosinophils Absolute: 0.1 10*3/uL (ref 0.0–0.7)
Eosinophils Relative: 1 %
HCT: 24 % — ABNORMAL LOW (ref 36.0–46.0)
HEMATOCRIT: 23.9 % — AB (ref 36.0–46.0)
Hemoglobin: 8.3 g/dL — ABNORMAL LOW (ref 12.0–15.0)
Hemoglobin: 8 g/dL — ABNORMAL LOW (ref 12.0–15.0)
LYMPHS ABS: 0.6 10*3/uL — AB (ref 0.7–4.0)
LYMPHS PCT: 61 %
Lymphocytes Relative: 10 %
Lymphs Abs: 0.2 10*3/uL — ABNORMAL LOW (ref 0.7–4.0)
MCH: 30.9 pg (ref 26.0–34.0)
MCH: 28.9 pg (ref 26.0–34.0)
MCHC: 34.6 g/dL (ref 30.0–36.0)
MCHC: 33.5 g/dL (ref 30.0–36.0)
MCV: 89.2 fL (ref 78.0–100.0)
MCV: 86.3 fL (ref 78.0–100.0)
MONO ABS: 1.4 10*3/uL — AB (ref 0.1–1.0)
Monocytes Absolute: 0 10*3/uL — ABNORMAL LOW (ref 0.1–1.0)
Monocytes Relative: 17 %
Monocytes Relative: 25 %
NEUTROS ABS: 0 10*3/uL — AB (ref 1.7–7.7)
NEUTROS ABS: 3.5 10*3/uL (ref 1.7–7.7)
NRBC: 2 /100{WBCs} — AB
Neutrophils Relative %: 22 %
Neutrophils Relative %: 63 %
PLATELETS: 157 10*3/uL (ref 150–400)
Platelets: 104 10*3/uL — ABNORMAL LOW (ref 150–400)
RBC: 2.69 MIL/uL — ABNORMAL LOW (ref 3.87–5.11)
RBC: 2.77 MIL/uL — ABNORMAL LOW (ref 3.87–5.11)
RDW: 16.1 % — ABNORMAL HIGH (ref 11.5–15.5)
RDW: 18.5 % — AB (ref 11.5–15.5)
WBC: 0.2 10*3/uL — CL (ref 4.0–10.5)
WBC: 5.7 10*3/uL (ref 4.0–10.5)

## 2018-08-31 LAB — TYPE AND SCREEN
ABO/RH(D): A POS
Antibody Screen: NEGATIVE
Unit division: 0
Unit division: 0

## 2018-08-31 LAB — BPAM RBC
Blood Product Expiration Date: 201910062359
Blood Product Expiration Date: 201910062359
ISSUE DATE / TIME: 201909162213
ISSUE DATE / TIME: 201909170100
Unit Type and Rh: 6200
Unit Type and Rh: 6200

## 2018-08-31 LAB — BASIC METABOLIC PANEL
Anion gap: 8 (ref 5–15)
BUN: 10 mg/dL (ref 8–23)
CALCIUM: 7.8 mg/dL — AB (ref 8.9–10.3)
CO2: 25 mmol/L (ref 22–32)
Chloride: 112 mmol/L — ABNORMAL HIGH (ref 98–111)
Creatinine, Ser: 0.7 mg/dL (ref 0.44–1.00)
GFR calc Af Amer: >60 mL/min (ref >60)
Glucose, Bld: 81 mg/dL (ref 70–99)
Potassium: 3.3 mmol/L — ABNORMAL LOW (ref 3.5–5.1)
Sodium: 145 mmol/L (ref 135–145)

## 2018-08-31 LAB — MAGNESIUM: Magnesium: 1.9 mg/dL (ref 1.7–2.4)

## 2018-08-31 MED ORDER — POTASSIUM CHLORIDE CRYS ER 20 MEQ PO TBCR
40.0000 meq | EXTENDED_RELEASE_TABLET | Freq: Once | ORAL | Status: AC
  Administered 2018-08-31: 40 meq via ORAL
  Filled 2018-08-31: qty 2

## 2018-08-31 MED ORDER — LOPERAMIDE HCL 2 MG PO CAPS
2.0000 mg | ORAL_CAPSULE | ORAL | Status: DC | PRN
  Administered 2018-08-31 – 2018-09-01 (×2): 2 mg via ORAL
  Filled 2018-08-31 (×2): qty 1

## 2018-08-31 MED ORDER — SODIUM CHLORIDE 0.9 % IV SOLN
INTRAVENOUS | Status: AC
  Administered 2018-08-31: 18:00:00 via INTRAVENOUS

## 2018-08-31 NOTE — Progress Notes (Signed)
Occupational Therapy Treatment Patient Details Name: KIAIRA POINTER MRN: [id number] DOB: 1936/09/26 Today's Date: 08/31/2018    History of present illness Pt is an 82 year old female with PMH of B-cell lymphoma  receiving chemotherapy, colon cancer S/P colectomy, GERD, COPD, HLD, depression, large soft tissue mass posterior chest wall with related bony destruction left 11th rib fracture, pathological fractures T8-9, destruction of right T11 and 12 transverse processes; admitted on 08/29/2018, presented with complaint of fatigue and tiredness and dizziness. Pt admitted to ED 1 day prior post-fall in home and stated then that she feels she can't take care of herself at home. Pt with SNF stay recently.    OT comments  Pt continues to have diarrhea. Assisted to 3:1 commode and AROM exercises. Not dizzy with OT.  Of note, she had positive orthostatic hypotension readings early this am with nursing. Requested geomat for chair.  Pt reports she gets both lightheaded and spins at times when she stands. Will continue to monitor/assess  Follow Up Recommendations  SNF    Equipment Recommendations  3 in 1 bedside commode    Recommendations for Other Services      Precautions / Restrictions Precautions Precautions: Fall Restrictions Weight Bearing Restrictions: No       Mobility Bed Mobility               General bed mobility comments: oob  Transfers   Equipment used: 1 person hand held assist   Sit to Stand: Min assist Stand pivot transfers: Min assist       General transfer comment: SPT using chair armrests and BSC armrests due to limited space    Balance             Standing balance-Leahy Scale: Poor                             ADL either performed or assessed with clinical judgement   ADL                           Toilet Transfer: Minimal assistance;Stand-pivot;BSC;RW   Toileting- Clothing Manipulation and Hygiene: Maximal assistance;Sit  to/from stand               Vision       Perception     Praxis      Cognition Arousal/Alertness: Awake/alert Behavior During Therapy: WFL for tasks assessed/performed Overall Cognitive Status: No family/caregiver present to determine baseline cognitive functioning                                 General Comments:  Not clear about dizziness.  Also placed hand on pad on chair that had bowel material        Exercises Exercises: Other exercises Other Exercises Other Exercises: bil UE AROM exercises to 90 degrees; encouraged pt to do this x 10 several times a day   Shoulder Instructions       General Comments Pt had orthostatics done this am by RN and she was positive for orthostatic hypotension.  Unsure if she got dizzy then. No dizziness during SPT. Pt reports not only does she get lightheaded but also sometimes experiences spinning.  Looked at moving head up and down while in chair. Will attempt rolling when she is in bed to see if she is symptomatic for BPPV.  Requested  geomat for chair.  Used 2 pillows for comfort    Pertinent Vitals/ Pain       Pain Score: 9  Pain Location: all over Pain Descriptors / Indicators: Sore;Aching Pain Intervention(s): Limited activity within patient's tolerance;Monitored during session;Premedicated before session;Repositioned;Patient requesting pain meds-RN notified  Home Living                                          Prior Functioning/Environment              Frequency  Min 2X/week        Progress Toward Goals  OT Goals(current goals can now be found in the care plan section)  Progress towards OT goals: Progressing toward goals     Plan Discharge plan needs to be updated    Co-evaluation                 AM-PAC PT "6 Clicks" Daily Activity     Outcome Measure   Help from another person eating meals?: None Help from another person taking care of personal grooming?: A  Little Help from another person toileting, which includes using toliet, bedpan, or urinal?: A Lot Help from another person bathing (including washing, rinsing, drying)?: A Lot Help from another person to put on and taking off regular upper body clothing?: A Little Help from another person to put on and taking off regular lower body clothing?: A Lot 6 Click Score: 16    End of Session    OT Visit Diagnosis: Unsteadiness on feet (R26.81);Repeated falls (R29.6);History of falling (Z91.81);Other abnormalities of gait and mobility (R26.89);Muscle weakness (generalized) (M62.81)   Activity Tolerance Patient tolerated treatment well   Patient Left in chair;with call bell/phone within reach;with nursing/sitter in room   Nurse Communication          Time: 1031-5945 OT Time Calculation (min): 33 min  Charges: OT Treatments $Self Care/Home Management : 8-22 mins $Therapeutic Activity: 8-22 mins  Lesle Chris, OTR/L Acute Rehabilitation Services [phone] [place] pager [phone] office 08/31/2018   Lathaniel Legate 08/31/2018, 1:10 PM

## 2018-08-31 NOTE — Progress Notes (Signed)
PROGRESS NOTE    Erica Escobar  [name] DOB: 1936-10-15 DOA: 08/29/2018 PCP: Deland Pretty, MD   Brief Narrative: Erica Escobar is a 82 y.o. female with past medical history of CKD, hyperlipidemia, large cell lymphoma, colon cancer status post colectomy, COPD, GERD. She presented secondary to weakness and falls. She was found to be orthostatic.   Assessment & Plan:   Principal Problem:   Weakness Active Problems:   Cancer of ascending colon s/p robotic colectomy 02/15/2015   COPD (chronic obstructive pulmonary disease) (HCC)   Large cell lymphoma (HCC)   CKD (chronic kidney disease), stage III (HCC)   Hyperlipidemia   FTT (failure to thrive) in adult   Symptomatic anemia   Malnutrition of moderate degree   Generalized weakness Frequent falls Recurrent issue in setting of chemotherapy, orthostatic hypotension, anemia. PT evaluated and recommended SNF. Patient unable to discharge to SNF. Will need to plan for home when ready for discharge.  Anemia Normocytic. In setting of lymphoma and chemotherapy. S/p 2 units of PRBC. Stable.  Orthostatic hypotension Given IV fluids. Orthostatic vitals still positive. -Precautions with ambulation -PT/OT  Large B-cell lymphoma Follows with Dr. Benay Spice as an outpatient. Currently undergoing chemotherapy and radiation therapy.  CKD stage III Baseline creatinine of 0.7. Stable.  Leukopenia/Neutropenia Secondary to chemotherapy. Improved.  COPD Stable  Hyperlipidemia -Continue gemfibrozil  Hypomagnesemia -Continue magnesium  Diarrhea Frequent. Concern this may lead to dehydration -Restart IV fluids -Continue to encourage oral intake -Imodium started by oncology   DVT prophylaxis: SCDs Code Status:   Code Status: DNR Family Communication: None at bedside Disposition Plan: Discharge when diarrhea and fluid status improved   Consultants:   Oncology  Procedures:   None  Antimicrobials:  None     Subjective: Diarrhea.   Objective: Vitals:   08/30/18 1735 08/30/18 2100 08/31/18 0546 08/31/18 1321  BP: (!) 151/78 (!) 150/84 (!) 156/67 140/82  Pulse: 84 80 71 (!) 103  Resp: 16 20 17 18   Temp: 98.5 F (36.9 C) 98.2 F (36.8 C) 98 F (36.7 C) 97.6 F (36.4 C)  TempSrc: Oral Oral Oral Oral  SpO2: 99% 99% 99% 98%  Weight:      Height:        Intake/Output Summary (Last 24 hours) at 08/31/2018 1707 Last data filed at 08/31/2018 0900 Gross per 24 hour  Intake 1411.67 ml  Output -  Net 1411.67 ml   Filed Weights   08/29/18 2321  Weight: 68.5 kg    Examination:  General exam: Appears calm and comfortable Respiratory system: Clear to auscultation. Respiratory effort normal. Cardiovascular system: S1 & S2 heard, RRR. No murmurs, rubs, gallops or clicks. Gastrointestinal system: Abdomen is nondistended, soft and nontender. No organomegaly or masses felt. Normal bowel sounds heard. Central nervous system: Alert and oriented. No focal neurological deficits. Extremities: No edema. No calf tenderness Skin: No cyanosis. No rashes Psychiatry: Judgement and insight appear normal. Mood & affect appropriate.     Data Reviewed: I have personally reviewed following labs and imaging studies  CBC: Recent Labs  Lab 08/28/18 0801 08/29/18 1700 08/30/18 0520 08/31/18 0317  WBC 0.2* 1.0* 2.9* 5.7  NEUTROABS 0.0* 0.3* 2.0 3.5  HGB 8.3* 5.5* 8.1* 8.0*  HCT 24.0* 15.9* 23.3* 23.9*  MCV 89.2 88.8 86.0 86.3  PLT 104* 121* 133* 865   Basic Metabolic Panel: Recent Labs  Lab 08/28/18 0801 08/29/18 1700 08/30/18 0520 08/31/18 0317  NA 139 139 143 145  K 3.6 3.6 3.7 3.3*  CL 105 105 107 112*  CO2 26 27 26 25   GLUCOSE 101* 85 79 81  BUN 24* 13 11 10   CREATININE 0.83 0.76 0.76 0.70  CALCIUM 8.0* 8.0* 8.1* 7.8*  MG  --   --  1.6* 1.9  PHOS  --   --  3.6  --    GFR: Estimated Creatinine Clearance: 52.1 mL/min (by C-G formula based on SCr of 0.7 mg/dL). Liver Function  Tests: Recent Labs  Lab 08/30/18 0520  AST 12*  ALT 10  ALKPHOS 57  BILITOT 1.7*  PROT 4.6*  ALBUMIN 2.1*   No results for input(s): LIPASE, AMYLASE in the last 168 hours. No results for input(s): AMMONIA in the last 168 hours. Coagulation Profile: No results for input(s): INR, PROTIME in the last 168 hours. Cardiac Enzymes: Recent Labs  Lab 08/29/18 2036 08/30/18 0520 08/30/18 1236  TROPONINI 0.03* <0.03 <0.03   BNP (last 3 results) No results for input(s): PROBNP in the last 8760 hours. HbA1C: No results for input(s): HGBA1C in the last 72 hours. CBG: No results for input(s): GLUCAP in the last 168 hours. Lipid Profile: No results for input(s): CHOL, HDL, LDLCALC, TRIG, CHOLHDL, LDLDIRECT in the last 72 hours. Thyroid Function Tests: Recent Labs    08/30/18 0520  TSH 1.239   Anemia Panel: Recent Labs    08/29/18 2036  VITAMINB12 944*  FOLATE 11.1  FERRITIN 820*  TIBC 182*  IRON 34  RETICCTPCT 3.9*   Sepsis Labs: No results for input(s): PROCALCITON, LATICACIDVEN in the last 168 hours.  Recent Results (from the past 240 hour(s))  C difficile quick scan w PCR reflex     Status: None   Collection Time: 08/30/18  5:20 PM  Result Value Ref Range Status   C Diff antigen NEGATIVE NEGATIVE Final   C Diff toxin NEGATIVE NEGATIVE Final   C Diff interpretation No C. difficile detected.  Final    Comment: Performed at [facility], [place] [address] [place], [place] [address]         Radiology Studies: Dg Abd 1 View  Result Date: 08/29/2018 CLINICAL DATA:  Generalize weakness EXAM: ABDOMEN - 1 VIEW COMPARISON:  08/16/2018, CT 08/14/2018, PET-CT 08/10/2018 FINDINGS: Lung bases are clear. Surgical clips in the right upper quadrant. Nonobstructed gas pattern with scattered small and large bowel gas. Surgical changes in the right mid abdomen. No abnormal calcification. IMPRESSION: Nonobstructed bowel-gas pattern. Electronically Signed   By: Donavan Foil M.D.   On: 08/29/2018 21:52   Ct Head Wo Contrast  Result Date: 08/29/2018 CLINICAL DATA:  Generalized weakness with recent fall. EXAM: CT HEAD WITHOUT CONTRAST TECHNIQUE: Contiguous axial images were obtained from the base of the skull through the vertex without intravenous contrast. COMPARISON:  None. FINDINGS: Brain: There is no mass, hemorrhage or extra-axial collection. The size and configuration of the ventricles and extra-axial CSF spaces are normal. There is no acute or chronic infarction. There is hypoattenuation of the periventricular white matter, most commonly indicating chronic ischemic microangiopathy. Vascular: No abnormal hyperdensity of the major intracranial arteries or dural venous sinuses. No intracranial atherosclerosis. Skull: The visualized skull base, calvarium and extracranial soft tissues are normal. Sinuses/Orbits: Remote right mastoidectomy. The right sphenoid sinus is opacified. The orbits are normal. IMPRESSION: 1. No acute intracranial abnormality. 2. Chronic small vessel ischemia. Electronically Signed   By: Ulyses Jarred M.D.   On: 08/29/2018 19:15   Ct Cervical Spine Wo Contrast  Result Date: 08/29/2018 CLINICAL  DATA:  Generalized weakness with fall. EXAM: CT CERVICAL SPINE WITHOUT CONTRAST TECHNIQUE: Multidetector CT imaging of the cervical spine was performed without intravenous contrast. Multiplanar CT image reconstructions were also generated. COMPARISON:  08/14/2018. FINDINGS: Alignment: 3 mm of facet mediated anterolisthesis C7-T1, 1-2 mm of similar facet mediated slip C6-7. Skull base and vertebrae: No acute fracture. No primary bone lesion or focal pathologic process. Soft tissues and spinal canal: No prevertebral fluid or swelling. No visible canal hematoma. Disc levels: No acute findings. Multilevel disc space narrowing. Facet disease. Upper chest:  No pneumothorax or visible mass. Other: Similar appearance to most recent priors. IMPRESSION: Cervical  spondylosis without acute findings. Electronically Signed   By: Staci Righter M.D.   On: 08/29/2018 19:15   Ct Abdomen Pelvis W Contrast  Result Date: 08/29/2018 CLINICAL DATA:  82 year old female with history of non-Hodgkin's lymphoma, colon cancer status post partial colectomy presents with gradual onset of fatigue and generalized weakness. EXAM: CT ABDOMEN AND PELVIS WITH CONTRAST TECHNIQUE: Multidetector CT imaging of the abdomen and pelvis was performed using the standard protocol following bolus administration of intravenous contrast. CONTRAST:  189m ISOVUE-300 IOPAMIDOL (ISOVUE-300) INJECTION 61% COMPARISON:  08/14/2018 FINDINGS: Lower chest: Top-normal size heart without pericardial effusion. Small hiatal hernia. Trace bilateral pleural effusions with atelectasis. No pulmonary consolidation or suspicious pulmonary lesions. Hepatobiliary: Cholecystectomy. Homogeneous appearance of the liver without space-occupying mass. No biliary dilatation. Pancreas: Normal Spleen: Splenic granuloma.  Normal splenic size.  No enhancing mass. Adrenals/Urinary Tract: No adrenal mass. Cortical enhancement is symmetric. No obstructive uropathy. The urinary bladder is unremarkable. Stomach/Bowel: Stigmata of right partial colectomy. No bowel obstruction or inflammation. The stomach is decompressed in appearance. There is normal small bowel rotation. Colonic diverticulosis without acute diverticulitis is identified along the course of the descending and sigmoid colon. Probable small internal hemorrhoids accounting for smooth thickening of the anus and distal rectum. Vascular/Lymphatic: Aortoiliac atherosclerosis. No adenopathy by CT size criteria. Reproductive: Hysterectomy.  No adnexal mass. Other: No free air nor free fluid. Musculoskeletal: Further decrease in right paraspinal soft tissue mass with adjacent lytic abnormality of the right tenth and eleventh ribs, currently 4.2 x 3.1 x 6.8 cm cm versus 3.6 x 5 x 8.6 cm at  the same level previously. IMPRESSION: 1. Trace bilateral pleural effusions with atelectasis. 2. Smaller right paraspinal soft tissue mass with areas of lytic bone destruction redemonstrated involving the posterior right tenth and eleventh ribs. 3. No acute solid nor hollow visceral organ abnormality. Status post right partial colectomy. Electronically Signed   By: DAshley RoyaltyM.D.   On: 08/29/2018 22:58        Scheduled Meds: . sodium chloride   Intravenous Once  . febuxostat  40 mg Oral Daily  . feeding supplement (ENSURE ENLIVE)  237 mL Oral BID BM  . gemfibrozil  600 mg Oral q morning - 10a  . metoCLOPramide  5 mg Oral TID AC  . mirtazapine  7.5 mg Oral QHS  . multivitamin with minerals  1 tablet Oral Daily  . pantoprazole  40 mg Oral Daily  . thiamine  100 mg Oral Daily   Continuous Infusions:   LOS: 0 days     RCordelia Poche MD [facility] 08/31/2018, 5:07 PM Pager: [phone] If 7PM-7AM, please contact night-coverage [email or web address] 08/31/2018, 5:07 PM

## 2018-08-31 NOTE — Progress Notes (Signed)
IP PROGRESS NOTE  Subjective:   Erica Escobar had multiple diarrhea stools yesterday.  No diarrhea since approximately 9:00 last night.  No bleeding.   Objective: Vital signs in last 24 hours: Blood pressure (!) 156/67, pulse 71, temperature 98 F (36.7 C), temperature source Oral, resp. rate 17, height 5' 3"  (1.6 m), weight 151 lb 0.2 oz (68.5 kg), SpO2 99 %.  Intake/Output from previous day: 09/17 0701 - 09/18 0700 In: 1891.7 [P.O.:840; I.V.:1051.7] Out: 800 [Urine:800]  Physical Exam:  HEENT: No thrush or ulcers Lungs: Clear anteriorly Cardiac: Regular rate and rhythm Abdomen: Soft, mild diffuse tenderness, no mass, no hepatosplenomegaly Extremities: No leg edema Neurologic: Alert and oriented  Portacath/PICC-without erythema  Lab Results: Recent Labs    08/30/18 0520 08/31/18 0317  WBC 2.9* 5.7  HGB 8.1* 8.0*  HCT 23.3* 23.9*  PLT 133* 157    BMET Recent Labs    08/30/18 0520 08/31/18 0317  NA 143 145  K 3.7 3.3*  CL 107 112*  CO2 26 25  GLUCOSE 79 81  BUN 11 10  CREATININE 0.76 0.70  CALCIUM 8.1* 7.8*    Lab Results  Component Value Date   CEA1 2.2 07/12/2018    Studies/Results: Dg Abd 1 View  Result Date: 08/29/2018 CLINICAL DATA:  Generalize weakness EXAM: ABDOMEN - 1 VIEW COMPARISON:  08/16/2018, CT 08/14/2018, PET-CT 08/10/2018 FINDINGS: Lung bases are clear. Surgical clips in the right upper quadrant. Nonobstructed gas pattern with scattered small and large bowel gas. Surgical changes in the right mid abdomen. No abnormal calcification. IMPRESSION: Nonobstructed bowel-gas pattern. Electronically Signed   By: Donavan Foil M.D.   On: 08/29/2018 21:52   Ct Head Wo Contrast  Result Date: 08/29/2018 CLINICAL DATA:  Generalized weakness with recent fall. EXAM: CT HEAD WITHOUT CONTRAST TECHNIQUE: Contiguous axial images were obtained from the base of the skull through the vertex without intravenous contrast. COMPARISON:  None. FINDINGS: Brain: There  is no mass, hemorrhage or extra-axial collection. The size and configuration of the ventricles and extra-axial CSF spaces are normal. There is no acute or chronic infarction. There is hypoattenuation of the periventricular white matter, most commonly indicating chronic ischemic microangiopathy. Vascular: No abnormal hyperdensity of the major intracranial arteries or dural venous sinuses. No intracranial atherosclerosis. Skull: The visualized skull base, calvarium and extracranial soft tissues are normal. Sinuses/Orbits: Remote right mastoidectomy. The right sphenoid sinus is opacified. The orbits are normal. IMPRESSION: 1. No acute intracranial abnormality. 2. Chronic small vessel ischemia. Electronically Signed   By: Ulyses Jarred M.D.   On: 08/29/2018 19:15   Ct Cervical Spine Wo Contrast  Result Date: 08/29/2018 CLINICAL DATA:  Generalized weakness with fall. EXAM: CT CERVICAL SPINE WITHOUT CONTRAST TECHNIQUE: Multidetector CT imaging of the cervical spine was performed without intravenous contrast. Multiplanar CT image reconstructions were also generated. COMPARISON:  08/14/2018. FINDINGS: Alignment: 3 mm of facet mediated anterolisthesis C7-T1, 1-2 mm of similar facet mediated slip C6-7. Skull base and vertebrae: No acute fracture. No primary bone lesion or focal pathologic process. Soft tissues and spinal canal: No prevertebral fluid or swelling. No visible canal hematoma. Disc levels: No acute findings. Multilevel disc space narrowing. Facet disease. Upper chest:  No pneumothorax or visible mass. Other: Similar appearance to most recent priors. IMPRESSION: Cervical spondylosis without acute findings. Electronically Signed   By: Staci Righter M.D.   On: 08/29/2018 19:15   Ct Abdomen Pelvis W Contrast  Result Date: 08/29/2018 CLINICAL DATA:  82 year old female with history of  non-Hodgkin's lymphoma, colon cancer status post partial colectomy presents with gradual onset of fatigue and generalized  weakness. EXAM: CT ABDOMEN AND PELVIS WITH CONTRAST TECHNIQUE: Multidetector CT imaging of the abdomen and pelvis was performed using the standard protocol following bolus administration of intravenous contrast. CONTRAST:  145m ISOVUE-300 IOPAMIDOL (ISOVUE-300) INJECTION 61% COMPARISON:  08/14/2018 FINDINGS: Lower chest: Top-normal size heart without pericardial effusion. Small hiatal hernia. Trace bilateral pleural effusions with atelectasis. No pulmonary consolidation or suspicious pulmonary lesions. Hepatobiliary: Cholecystectomy. Homogeneous appearance of the liver without space-occupying mass. No biliary dilatation. Pancreas: Normal Spleen: Splenic granuloma.  Normal splenic size.  No enhancing mass. Adrenals/Urinary Tract: No adrenal mass. Cortical enhancement is symmetric. No obstructive uropathy. The urinary bladder is unremarkable. Stomach/Bowel: Stigmata of right partial colectomy. No bowel obstruction or inflammation. The stomach is decompressed in appearance. There is normal small bowel rotation. Colonic diverticulosis without acute diverticulitis is identified along the course of the descending and sigmoid colon. Probable small internal hemorrhoids accounting for smooth thickening of the anus and distal rectum. Vascular/Lymphatic: Aortoiliac atherosclerosis. No adenopathy by CT size criteria. Reproductive: Hysterectomy.  No adnexal mass. Other: No free air nor free fluid. Musculoskeletal: Further decrease in right paraspinal soft tissue mass with adjacent lytic abnormality of the right tenth and eleventh ribs, currently 4.2 x 3.1 x 6.8 cm cm versus 3.6 x 5 x 8.6 cm at the same level previously. IMPRESSION: 1. Trace bilateral pleural effusions with atelectasis. 2. Smaller right paraspinal soft tissue mass with areas of lytic bone destruction redemonstrated involving the posterior right tenth and eleventh ribs. 3. No acute solid nor hollow visceral organ abnormality. Status post right partial colectomy.  Electronically Signed   By: DAshley RoyaltyM.D.   On: 08/29/2018 22:58    Medications: I have reviewed the patient's current medications.  Assessment/Plan: 1. 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 Loss of MLH1 and PMS2, MSI-high, BRAF mutation detected ? Colonoscopy February 2017 ? Colonoscopy 12/16/2017-3 mm polyp in the transverse colon, 10 mm polyp in the sigmoid colon. Diverticulosis in the sigmoid colon. Transverse colon polyp, tubular adenoma; sigmoid colon polyp, tubular adenoma. ? CT 07/12/2018-probable subcutaneous metastasis at the left abdominal wall, left perinephric mass, destructive thoracic/lumbar paraspinous mass ? CT-guided biopsy of the paraspinous mass 07/13/2018  2. Anemia-likely secondary to bleeding from the colon cancer and surgery. Hemoglobin in normal range 03/20/2016, mild persistent anemia 3. Ascending colon "polyps" removed on the colonoscopy 12/22/2014 with the pathology revealing poorly differentiated adenocarcinoma and a sessile serrated adenoma polyp, similar to the pathology on the hepatic flexure mass 4. History of multiple skin cancers. Seen by dermatology every 3 months. 5. Glaucoma 6.Non-Hodgkin's lymphoma presenting with severe back pain  CTabdomen/pelvis7/30/2019-large destructive right paraspinal chest wall mass lower thoracic/upper lumbar spinewith associated bone destruction involving the right posterior elements of T9, T10 and T11, spinous processes of T8 and T9, and the posterior right 10th and 11th ribs. Probable subcutaneous metastasis anterior left abdominal wall 2.2 cm; enlarging mass in the left perinephric space 3.0 cm in size.  CT L-spine 07/12/2018-paraspinous soft tissue mass on the right at the lower thoracic spine causing destruction of the right T11 and 12 transverse processes; may encroach on the right T11 neural foramen.  CT-guided biopsy  paraspinous mass 07/13/2018-poorly differentiated malignant neoplasm; CD45, CD20,bcl-6 andbcl-2positive. 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  Radiationto paraspinous massinitiated 07/14/2018  Cycle 1 CVP/Rituxan 08/02/2018 08/29/2018-decreased  size of right paraspinal soft tissue mass,  PET scan 08/10/2018- relatively mild metabolic activity within the right paraspinal mass which is decreased significantly in size from the comparison exam. No evidence of lymphoma elsewhere. Focal activity within the distal esophagus above the GE junction without mass lesion or obstruction.  Cycle 2 CVP/Rituxan 08/22/2018, udenyca added  CT abdomen/pelvis- decrease in size of the right paraspinal soft tissue mass with adjacent lytic abnormality in the right posterior 10th and 11th ribs. 7.Anorexia/weight loss 8.Mild elevation of calcium-questionhypercalcemia of malignancy 9.History of severe neutropeniaand anemiasecondary to radiation and chemotherapy; udenyca added with cycle 2 CVP/Rituxan  10.  Admission 08/29/2018 with failure to thrive and severe anemia/neutropenia, status post red cell transfusions on 08/30/2018  Erica Escobar appears stable this morning.  The white count has recovered.  Erica Escobar had multiple diarrheal stools yesterday.  A C. difficile assay is negative.  I doubt the diarrhea is related to the few doses of metoclopramide. It would be unusual to have diarrhea with the CVP-Rituxan regimen.  Erica Escobar will need skilled nursing facility placement versus an inpatient rehabilitation stay at discharge.  Recommendations: 1.  Continue Reglan for now, discontinue for persistent diarrhea 2.  Begin Imodium, add IV fluids for persistent diarrhea 3.  Skilled nursing facility placement versus inpatient rehabilitation stay at discharge 4.  Outpatient follow-up as scheduled at the Cancer center as scheduled 09/12/2018      LOS: 0 days   Betsy Coder, MD   08/31/2018, 6:54 AM

## 2018-08-31 NOTE — Plan of Care (Signed)

## 2018-08-31 NOTE — Clinical Social Work Note (Signed)
Clinical Social Work Assessment  Patient Details  Name: Erica Escobar MRN: [id number] Date of Birth: 03-25-1936  Date of referral:  08/31/18               Reason for consult:  Facility Placement                Permission sought to share information with:  Family Supports Permission granted to share information::  Yes, Verbal Permission Granted  Name::     Erica Escobar  Agency::     Relationship::  Son  Contact Information:  [phone]  Housing/Transportation Living arrangements for the past 2 months:  [place], Single Family Home(Patient discharged from SNF on 9/1) Source of Information:  Patient, Adult Children Patient Interpreter Needed:  None Criminal Activity/Legal Involvement Pertinent to Current Situation/Hospitalization:  No - Comment as needed Significant Relationships:  Adult Children, Siblings Lives with:  Self, Adult Children(Patient's son reported that he has been staying with patient since she was discharged from SNF) Do you feel safe going back to the place where you live?  (PT recommending SNF) Need for family participation in patient care:  Yes (Comment)  Care giving concerns:  Patient from home with son. Patient's son reported that patient has been using a wheelchair and requires assistance with ADLs. Patient's son reported that his aunt comes to assist patient with bathing and dressing. Patient discharged from SNF on 08/14/2018 and son reports that they did not have a good experience. Per chart review, patient declined for CIR. PT recommending SNF;Supervision for mobility/OOB.    Social Worker assessment / plan:  CSW spoke with patient at bedside regarding SNF recommendation and discharge planning, patient requested that CSW contact her son because sometimes she gets confused. CSW agreed to contact patient's son to discuss discharge planning further.  CSW contacted patient's son Erica Escobar) and discussed PT recommendation for SNF and discharge  planning. Patient's son reported that they did not have a good experience at patient's previous SNF and he did not want her to return there. CSW explained PT recommendation for SNF, insurance authorization and copay days for SNF, patient's son verbalized understanding. Patient's son reported that patient is unable to pay copay days at [facility] and that he has not been able to apply for Medicaid for patient. Patient's son explained that patient has just enough money in savings to not qualify for medicaid and that patient has not been well enough to retrieve bank statements from her bank. Patient's son reported that the plan is for patient to return home with home health services because patient is unable to afford copayments at [facility]. Patient's son reported that patient is active with [facility]. CSW agreed to update patient's RNCM.  CSW updated patient's RNCM.  Patient's plan is to discharge home with home health services. CSW signing off, no other needs identified at this time.    Employment status:  Retired Nurse, adult PT Recommendations:  [place] / Referral to community resources:  Other (Comment Required)(RNCM notified that patient is active with [facility] for home health services)  Patient/Family's Response to care:  Patient's son appreciative of CSW assistance with discharge planning.   Patient/Family's Understanding of and Emotional Response to Diagnosis, Current Treatment, and Prognosis:  Patient presented calm and deferred to her son to discuss discharge planning. Patient's son involved in patient's care and verbalized understanding of current treatment plan. Patient's son informed CSW about recent SNF experience, CSW validated  patient's son's concerns and feelings. Patient's son informed CSW that patient is unable to afford copays at [facility] and will discharge home with home health services.  Emotional Assessment Appearance:   Appears stated age Attitude/Demeanor/Rapport:  Other(cooperative) Affect (typically observed):  Calm, Appropriate Orientation:  Oriented to Self, Oriented to Place, Oriented to Situation, Oriented to  Time(Patient reported that sometimes she gets confused) Alcohol / Substance use:  Not Applicable Psych involvement (Current and /or in the community):  No (Comment)  Discharge Needs  Concerns to be addressed:  Care Coordination Readmission within the last 30 days:  Yes Current discharge risk:  Physical Impairment Barriers to Discharge:  Continued Medical Work up   [employer], LCSW 08/31/2018, 12:43 PM

## 2018-08-31 NOTE — Progress Notes (Signed)
[facility]  Patient Status: Active (receiving services up to time of hospitalization)  [facility] is providing the following services: RN, PT and OT  If patient discharges after hours, please call [phone].   Erica Escobar 08/31/2018, 9:20 AM

## 2018-09-01 ENCOUNTER — Inpatient Hospital Stay: Payer: Medicare Other

## 2018-09-01 DIAGNOSIS — Z79899 Other long term (current) drug therapy: Secondary | ICD-10-CM | POA: Diagnosis not present

## 2018-09-01 DIAGNOSIS — I129 Hypertensive chronic kidney disease with stage 1 through stage 4 chronic kidney disease, or unspecified chronic kidney disease: Secondary | ICD-10-CM | POA: Diagnosis present

## 2018-09-01 DIAGNOSIS — K921 Melena: Secondary | ICD-10-CM | POA: Diagnosis present

## 2018-09-01 DIAGNOSIS — K5641 Fecal impaction: Secondary | ICD-10-CM | POA: Diagnosis present

## 2018-09-01 DIAGNOSIS — I951 Orthostatic hypotension: Secondary | ICD-10-CM | POA: Diagnosis present

## 2018-09-01 DIAGNOSIS — C8330 Diffuse large B-cell lymphoma, unspecified site: Secondary | ICD-10-CM | POA: Diagnosis present

## 2018-09-01 DIAGNOSIS — R197 Diarrhea, unspecified: Secondary | ICD-10-CM | POA: Diagnosis not present

## 2018-09-01 DIAGNOSIS — I959 Hypotension, unspecified: Secondary | ICD-10-CM | POA: Diagnosis not present

## 2018-09-01 DIAGNOSIS — R296 Repeated falls: Secondary | ICD-10-CM | POA: Diagnosis present

## 2018-09-01 DIAGNOSIS — Z85038 Personal history of other malignant neoplasm of large intestine: Secondary | ICD-10-CM | POA: Diagnosis not present

## 2018-09-01 DIAGNOSIS — D649 Anemia, unspecified: Secondary | ICD-10-CM | POA: Diagnosis not present

## 2018-09-01 DIAGNOSIS — C182 Malignant neoplasm of ascending colon: Secondary | ICD-10-CM | POA: Diagnosis not present

## 2018-09-01 DIAGNOSIS — R627 Adult failure to thrive: Secondary | ICD-10-CM | POA: Diagnosis not present

## 2018-09-01 DIAGNOSIS — J449 Chronic obstructive pulmonary disease, unspecified: Secondary | ICD-10-CM | POA: Diagnosis present

## 2018-09-01 DIAGNOSIS — E870 Hyperosmolality and hypernatremia: Secondary | ICD-10-CM | POA: Diagnosis present

## 2018-09-01 DIAGNOSIS — Z6826 Body mass index (BMI) 26.0-26.9, adult: Secondary | ICD-10-CM | POA: Diagnosis not present

## 2018-09-01 DIAGNOSIS — D6481 Anemia due to antineoplastic chemotherapy: Secondary | ICD-10-CM | POA: Diagnosis present

## 2018-09-01 DIAGNOSIS — E785 Hyperlipidemia, unspecified: Secondary | ICD-10-CM | POA: Diagnosis present

## 2018-09-01 DIAGNOSIS — Z85828 Personal history of other malignant neoplasm of skin: Secondary | ICD-10-CM | POA: Diagnosis not present

## 2018-09-01 DIAGNOSIS — E860 Dehydration: Secondary | ICD-10-CM | POA: Diagnosis present

## 2018-09-01 DIAGNOSIS — Z66 Do not resuscitate: Secondary | ICD-10-CM | POA: Diagnosis present

## 2018-09-01 DIAGNOSIS — Z9049 Acquired absence of other specified parts of digestive tract: Secondary | ICD-10-CM | POA: Diagnosis not present

## 2018-09-01 DIAGNOSIS — T451X5A Adverse effect of antineoplastic and immunosuppressive drugs, initial encounter: Secondary | ICD-10-CM | POA: Diagnosis present

## 2018-09-01 DIAGNOSIS — K449 Diaphragmatic hernia without obstruction or gangrene: Secondary | ICD-10-CM | POA: Diagnosis present

## 2018-09-01 DIAGNOSIS — R531 Weakness: Secondary | ICD-10-CM | POA: Diagnosis not present

## 2018-09-01 DIAGNOSIS — N179 Acute kidney failure, unspecified: Secondary | ICD-10-CM | POA: Diagnosis present

## 2018-09-01 DIAGNOSIS — N183 Chronic kidney disease, stage 3 (moderate): Secondary | ICD-10-CM | POA: Diagnosis not present

## 2018-09-01 DIAGNOSIS — K219 Gastro-esophageal reflux disease without esophagitis: Secondary | ICD-10-CM | POA: Diagnosis present

## 2018-09-01 DIAGNOSIS — E440 Moderate protein-calorie malnutrition: Secondary | ICD-10-CM | POA: Diagnosis present

## 2018-09-01 DIAGNOSIS — K648 Other hemorrhoids: Secondary | ICD-10-CM | POA: Diagnosis present

## 2018-09-01 LAB — BASIC METABOLIC PANEL
Anion gap: 10 (ref 5–15)
BUN: 8 mg/dL (ref 8–23)
CHLORIDE: 113 mmol/L — AB (ref 98–111)
CO2: 24 mmol/L (ref 22–32)
CREATININE: 0.71 mg/dL (ref 0.44–1.00)
Calcium: 8 mg/dL — ABNORMAL LOW (ref 8.9–10.3)
Glucose, Bld: 75 mg/dL (ref 70–99)
POTASSIUM: 3.7 mmol/L (ref 3.5–5.1)
SODIUM: 147 mmol/L — AB (ref 135–145)

## 2018-09-01 LAB — CBC
HCT: 25.5 % — ABNORMAL LOW (ref 36.0–46.0)
Hemoglobin: 8.5 g/dL — ABNORMAL LOW (ref 12.0–15.0)
MCH: 29.7 pg (ref 26.0–34.0)
MCHC: 33.3 g/dL (ref 30.0–36.0)
MCV: 89.2 fL (ref 78.0–100.0)
PLATELETS: 166 10*3/uL (ref 150–400)
RBC: 2.86 MIL/uL — ABNORMAL LOW (ref 3.87–5.11)
RDW: 18.9 % — AB (ref 11.5–15.5)
WBC: 9.4 10*3/uL (ref 4.0–10.5)

## 2018-09-01 MED ORDER — TRAMADOL HCL 50 MG PO TABS
50.0000 mg | ORAL_TABLET | Freq: Two times a day (BID) | ORAL | Status: DC | PRN
  Administered 2018-09-01 – 2018-09-02 (×2): 50 mg via ORAL
  Filled 2018-09-01 (×3): qty 1

## 2018-09-01 MED ORDER — DEXTROSE-NACL 5-0.45 % IV SOLN
INTRAVENOUS | Status: DC
  Administered 2018-09-01 – 2018-09-02 (×3): via INTRAVENOUS

## 2018-09-01 NOTE — Progress Notes (Signed)
IP PROGRESS NOTE  Subjective:   Erica Escobar continues to have diarrhea, though this has improved.  She reports the stool became "black" yesterday.  She complains of abdominal pain and nausea.  Objective: Vital signs in last 24 hours: Blood pressure 138/78, pulse 90, temperature 98.4 F (36.9 C), temperature source Oral, resp. rate 18, height _0  (1.6 m), weight 151 lb 0.2 oz (68.5 kg), SpO2 97 %.  Intake/Output from previous day: 09/18 0701 - 09/19 0700 In: 1372.5 [P.O.:480; I.V.:892.5] Out: -   Physical Exam:  HEENT: No thrush or ulcers Lungs: Clear bilaterally Cardiac: Regular rate and rhythm Abdomen: Soft, mild diffuse tenderness, no mass, no hepatosplenomegaly, small amount of tan stool staining the perineum, soft external hemorrhoid Extremities: No leg edema Neurologic: Alert and oriented  Portacath/PICC-without erythema  Lab Results: Recent Labs    08/30/18 0520 08/31/18 0317  WBC 2.9* 5.7  HGB 8.1* 8.0*  HCT 23.3* 23.9*  PLT 133* 157    BMET Recent Labs    08/31/18 0317 09/01/18 0333  NA 145 147*  K 3.3* 3.7  CL 112* 113*  CO2 25 24  GLUCOSE 81 75  BUN 10 8  CREATININE 0.70 0.71  CALCIUM 7.8* 8.0*    Lab Results  Component Value Date   CEA1 2.2 07/12/2018    Studies/Results: No results found.  Medications: I have reviewed the patient's current medications.  Assessment/Plan: 1. 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 Loss of MLH1 and PMS2, MSI-high, BRAF mutation detected ? Colonoscopy February 2017 ? Colonoscopy 12/16/2017-3 mm polyp in the transverse colon, 10 mm polyp in the sigmoid colon. Diverticulosis in the sigmoid colon. Transverse colon polyp, tubular adenoma; sigmoid colon polyp, tubular adenoma. ? CT 07/12/2018-probable subcutaneous metastasis at the left abdominal wall, left perinephric mass, destructive thoracic/lumbar paraspinous  mass ? CT-guided biopsy of the paraspinous mass 07/13/2018  2. Anemia-likely secondary to bleeding from the colon cancer and surgery. Hemoglobin in normal range 03/20/2016, mild persistent anemia 3. Ascending colon "polyps" removed on the colonoscopy 12/22/2014 with the pathology revealing poorly differentiated adenocarcinoma and a sessile serrated adenoma polyp, similar to the pathology on the hepatic flexure mass 4. History of multiple skin cancers. Seen by dermatology every 3 months. 5. Glaucoma 6.Non-Hodgkin's lymphoma presenting with severe back pain  CTabdomen/pelvis7/30/2019-large destructive right paraspinal chest wall mass lower thoracic/upper lumbar spinewith associated bone destruction involving the right posterior elements of T9, T10 and T11, spinous processes of T8 and T9, and the posterior right 10th and 11th ribs. Probable subcutaneous metastasis anterior left abdominal wall 2.2 cm; enlarging mass in the left perinephric space 3.0 cm in size.  CT L-spine 07/12/2018-paraspinous soft tissue mass on the right at the lower thoracic spine causing destruction of the right T11 and 12 transverse processes; may encroach on the right T11 neural foramen.  CT-guided biopsy paraspinous mass 07/13/2018-poorly differentiated malignant neoplasm; CD45, CD20,bcl-6 andbcl-2positive. 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  Radiationto paraspinous massinitiated 07/14/2018  Cycle 1 CVP/Rituxan 08/02/2018 08/29/2018-decreased size of right paraspinal soft tissue mass,  PET scan 08/10/2018- relatively mild metabolic activity within the right paraspinal mass which is decreased significantly in size from the comparison exam. No evidence of lymphoma elsewhere. Focal activity within the distal esophagus above the GE junction without mass lesion or obstruction.  Cycle 2 CVP/Rituxan 08/22/2018, udenyca added  CT abdomen/pelvis- decrease in size  of the right paraspinal soft tissue  mass with adjacent lytic abnormality in the right posterior 10th and 11th ribs. 7.Anorexia/weight loss 8.Mild elevation of calcium-questionhypercalcemia of malignancy 9.History of severe neutropeniaand anemiasecondary to radiation and chemotherapy; udenyca added with cycle 2 CVP/Rituxan  10.  Admission 08/29/2018 with failure to thrive and severe anemia/neutropenia, status post red cell transfusions on 08/30/2018-the neutrophil count has recovered  11.  Diarrhea, nausea-etiology unclear- C. difficile toxin negative  Ms. Langstaff continues to have diarrhea of unclear etiology.  She complains of nausea and abdominal pain.  She reports "black "stool yesterday.  She is at risk for peptic ulcer disease with the prolonged course of steroids.    She will need skilled nursing facility placement at discharge.  Recommendations: 1.  Repeat CBC for recurrent dark stool 2.  Consider GI evaluation for persistent nausea and abdominal pain 3.  Physical therapy, skilled nursing facility placement at discharge 4.  Outpatient follow-up as scheduled at the Cancer center as scheduled 09/12/2018  Please call Oncology as needed.  I will be out until 09/12/2018.    LOS: 0 days   Betsy Coder, MD   09/01/2018, 12:57 PM

## 2018-09-01 NOTE — Progress Notes (Signed)
PROGRESS NOTE    Erica Escobar  ACZ:[id number] DOB: Dec 05, 1936 DOA: 08/29/2018 PCP: Deland Pretty, MD   Brief Narrative: Erica Escobar is a 82 y.o. female with past medical history of CKD, hyperlipidemia, large cell lymphoma, colon cancer status post colectomy, COPD, GERD. She presented secondary to weakness and falls. She was found to be orthostatic.   Assessment & Plan:   Principal Problem:   Weakness Active Problems:   Cancer of ascending colon s/p robotic colectomy 02/15/2015   COPD (chronic obstructive pulmonary disease) (HCC)   Large cell lymphoma (HCC)   CKD (chronic kidney disease), stage III (HCC)   Hyperlipidemia   FTT (failure to thrive) in adult   Symptomatic anemia   Malnutrition of moderate degree   Generalized weakness Frequent falls Recurrent issue in setting of chemotherapy, orthostatic hypotension, anemia. PT evaluated and recommended SNF. Patient unable to discharge to SNF. Will need to plan for home when ready for discharge.  Anemia Normocytic. In setting of lymphoma and chemotherapy. S/p 2 units of PRBC. Stable. -Repeat CBC  Orthostatic hypotension Given IV fluids. Orthostatic vitals still positive. -Precautions with ambulation -PT/OT -Orthostatic vitals  Large B-cell lymphoma Follows with Dr. Benay Spice as an outpatient. Currently undergoing chemotherapy and radiation therapy.  CKD stage III Baseline creatinine of 0.7. Stable.  Leukopenia/Neutropenia Secondary to chemotherapy. Improved.  COPD Stable  Hyperlipidemia -Continue gemfibrozil  Hypomagnesemia -Continue magnesium  Diarrhea Frequent. Concern this may lead to dehydration. Associated hypernatremia. -Switch to D5 1/2 NS IV fluids -Continue to encourage oral intake -Imodium started by oncology  Hypernatremia Secondary to GI losses in addition to receiving NS IV fluids -IV fluids as mentioned above -Repeat BMP in AM  Dark stool Per patient. In addition to low hemoglobin,  hematology is consulting GI per patient. No bright red blood. -FOBT   DVT prophylaxis: SCDs Code Status:   Code Status: DNR Family Communication: Son and daughter in law at bedside Disposition Plan: Discharge when diarrhea and fluid status improved   Consultants:   Oncology  Procedures:   None  Antimicrobials:  None    Subjective: Dark stool overnight.   Objective: Vitals:   08/31/18 0546 08/31/18 1321 08/31/18 2106 09/01/18 0516  BP: (!) 156/67 140/82 (!) 159/77 138/78  Pulse: 71 (!) 103 79 90  Resp: 17 18 20 18   Temp: 98 F (36.7 C) 97.6 F (36.4 C) 97.9 F (36.6 C) 98.4 F (36.9 C)  TempSrc: Oral Oral Oral Oral  SpO2: 99% 98% 99% 97%  Weight:      Height:        Intake/Output Summary (Last 24 hours) at 09/01/2018 0753 Last data filed at 09/01/2018 0600 Gross per 24 hour  Intake 1372.5 ml  Output -  Net 1372.5 ml   Filed Weights   08/29/18 2321  Weight: 68.5 kg    Examination:  General exam: Appears calm and comfortable Respiratory system: Clear to auscultation. Respiratory effort normal. Cardiovascular system: S1 & S2 heard, RRR. No murmurs, rubs, gallops or clicks. Gastrointestinal system: Abdomen is nondistended, soft and nontender. No organomegaly or masses felt. Normal bowel sounds heard. Central nervous system: Alert and oriented. No focal neurological deficits. Extremities: No edema. No calf tenderness Skin: No cyanosis. No rashes Psychiatry: Judgement and insight appear normal. Mood & affect appropriate.    Data Reviewed: I have personally reviewed following labs and imaging studies  CBC: Recent Labs  Lab 08/28/18 0801 08/29/18 1700 08/30/18 0520 08/31/18 0317  WBC 0.2* 1.0* 2.9* 5.7  NEUTROABS  0.0* 0.3* 2.0 3.5  HGB 8.3* 5.5* 8.1* 8.0*  HCT 24.0* 15.9* 23.3* 23.9*  MCV 89.2 88.8 86.0 86.3  PLT 104* 121* 133* 035   Basic Metabolic Panel: Recent Labs  Lab 08/28/18 0801 08/29/18 1700 08/30/18 0520 08/31/18 0317  09/01/18 0333  NA 139 139 143 145 147*  K 3.6 3.6 3.7 3.3* 3.7  CL 105 105 107 112* 113*  CO2 26 27 26 25 24   GLUCOSE 101* 85 79 81 75  BUN 24* 13 11 10 8   CREATININE 0.83 0.76 0.76 0.70 0.71  CALCIUM 8.0* 8.0* 8.1* 7.8* 8.0*  MG  --   --  1.6* 1.9  --   PHOS  --   --  3.6  --   --    GFR: Estimated Creatinine Clearance: 52.1 mL/min (by C-G formula based on SCr of 0.71 mg/dL). Liver Function Tests: Recent Labs  Lab 08/30/18 0520  AST 12*  ALT 10  ALKPHOS 57  BILITOT 1.7*  PROT 4.6*  ALBUMIN 2.1*   No results for input(s): LIPASE, AMYLASE in the last 168 hours. No results for input(s): AMMONIA in the last 168 hours. Coagulation Profile: No results for input(s): INR, PROTIME in the last 168 hours. Cardiac Enzymes: Recent Labs  Lab 08/29/18 2036 08/30/18 0520 08/30/18 1236  TROPONINI 0.03* <0.03 <0.03   BNP (last 3 results) No results for input(s): PROBNP in the last 8760 hours. HbA1C: No results for input(s): HGBA1C in the last 72 hours. CBG: No results for input(s): GLUCAP in the last 168 hours. Lipid Profile: No results for input(s): CHOL, HDL, LDLCALC, TRIG, CHOLHDL, LDLDIRECT in the last 72 hours. Thyroid Function Tests: Recent Labs    08/30/18 0520  TSH 1.239   Anemia Panel: Recent Labs    08/29/18 2036  VITAMINB12 944*  FOLATE 11.1  FERRITIN 820*  TIBC 182*  IRON 34  RETICCTPCT 3.9*   Sepsis Labs: No results for input(s): PROCALCITON, LATICACIDVEN in the last 168 hours.  Recent Results (from the past 240 hour(s))  C difficile quick scan w PCR reflex     Status: None   Collection Time: 08/30/18  5:20 PM  Result Value Ref Range Status   C Diff antigen NEGATIVE NEGATIVE Final   C Diff toxin NEGATIVE NEGATIVE Final   C Diff interpretation No C. difficile detected.  Final    Comment: Performed at [facility], [place] [address] [place], [place] [address]         Radiology Studies: No results found.      Scheduled  Meds: . sodium chloride   Intravenous Once  . febuxostat  40 mg Oral Daily  . feeding supplement (ENSURE ENLIVE)  237 mL Oral BID BM  . gemfibrozil  600 mg Oral q morning - 10a  . metoCLOPramide  5 mg Oral TID AC  . mirtazapine  7.5 mg Oral QHS  . multivitamin with minerals  1 tablet Oral Daily  . pantoprazole  40 mg Oral Daily  . thiamine  100 mg Oral Daily   Continuous Infusions: . dextrose 5 % and 0.45% NaCl       LOS: 0 days     Cordelia Poche, MD [facility] 09/01/2018, 7:53 AM Pager: [phone]  If 7PM-7AM, please contact night-coverage [email or web address] 09/01/2018, 7:53 AM

## 2018-09-01 NOTE — Progress Notes (Signed)
Physical Therapy Treatment Patient Details Name: Erica Escobar MRN: [id number] DOB: September 26, 1936 Today's Date: 09/01/2018    History of Present Illness Pt is an 82 year old female with PMH of B-cell lymphoma  receiving chemotherapy, colon cancer S/P colectomy, GERD, COPD, HLD, depression, large soft tissue mass posterior chest wall with related bony destruction left 11th rib fracture, pathological fractures T8-9, destruction of right T11 and 12 transverse processes; admitted on 08/29/2018, presented with complaint of fatigue and tiredness and dizziness. Pt admitted to ED 1 day prior post-fall in home and stated then that she feels she can't take care of herself at home. Pt with SNF stay recently.     PT Comments    Pt with less complaints of fatigue and increased ambulation distance today. Pt requiring seated rest break during session. PT still recommending SNF because pt has deficits and history of falls that would benefit from ST-rehab. Pt states that she cannot afford to go to SNF at this time. Therefore, HHPT is appropriate if unable to d/c to SNF. Frequency updated for home d/c. Will continue to follow acutely.    Follow Up Recommendations  SNF;Supervision for mobility/OOB (if unable to d/c to SNF, HHPT to address physical deficits)     Equipment Recommendations  None recommended by PT    Recommendations for Other Services       Precautions / Restrictions Precautions Precautions: Fall Restrictions Weight Bearing Restrictions: No    Mobility  Bed Mobility   Bed Mobility: Supine to Sit     Supine to sit: Supervision     General bed mobility comments: Pt initiating supine to sit upon PT arrival to room because pt needed to urinate. Supervision for safety.   Transfers Overall transfer level: Needs assistance Equipment used: Rolling walker (2 wheeled) Transfers: Sit to/from Stand Sit to Stand: Min guard         General transfer comment: Min guard for safety. Verbal  cuing for hand placement.   Ambulation/Gait Ambulation/Gait assistance: Min guard;+2 safety/equipment(recliner follow ) Gait Distance (Feet): 50 Feet Assistive device: Rolling walker (2 wheeled) Gait Pattern/deviations: Step-through pattern;Trendelenburg;Trunk flexed;Drifts right/left;Decreased stride length Gait velocity: very decr    General Gait Details: Min guard for safety, less "wobbly" LEs during session. After 30 ft ambulation, pt required seated rest break for dyspnea and fatigue. O2sat on room air 100%, pulse 98 bpm. After 2 minute rest, pt ambulated 20 more ft.    Stairs             Wheelchair Mobility    Modified Rankin (Stroke Patients Only)       Balance Overall balance assessment: History of Falls;Needs assistance(Pt states "I lost count" ) Sitting-balance support: Feet supported Sitting balance-Leahy Scale: Fair       Standing balance-Leahy Scale: Poor Standing balance comment: relies on RW and PT for balance and support                             Cognition Arousal/Alertness: Awake/alert Behavior During Therapy: WFL for tasks assessed/performed Overall Cognitive Status: Within Functional Limits for tasks assessed                                        Exercises General Exercises - Lower Extremity Long Arc Quad: AROM;Both;10 reps;Seated Straight Leg Raises: AROM;Both;10 reps;Seated Mini-Sqauts: AROM;Both;Seated(sit to stand from chair, 1  set of 4 reps, 1 set of 5 reps) Other Exercises Other Exercises: 30-second sit to stand: 5 repetitions, use of bilat UEs for powering up from chair. Pt in fall risk category.    General Comments General comments (skin integrity, edema, etc.): no dizziness complaints today       Pertinent Vitals/Pain Pain Assessment: Faces Faces Pain Scale: Hurts little more Pain Location: Gluteal region  Pain Descriptors / Indicators: Aching;Sore Pain Intervention(s): Limited activity within  patient's tolerance;Repositioned;Monitored during session    Home Living                      Prior Function            PT Goals (current goals can now be found in the care plan section) Acute Rehab PT Goals PT Goal Formulation: With patient Time For Goal Achievement: 09/13/18 Potential to Achieve Goals: Good Progress towards PT goals: Progressing toward goals    Frequency    Min 3X/week      PT Plan Current plan remains appropriate;Frequency needs to be updated    Co-evaluation              AM-PAC PT "6 Clicks" Daily Activity  Outcome Measure  Difficulty turning over in bed (including adjusting bedclothes, sheets and blankets)?: A Little Difficulty moving from lying on back to sitting on the side of the bed? : A Little Difficulty sitting down on and standing up from a chair with arms (e.g., wheelchair, bedside commode, etc,.)?: Unable Help needed moving to and from a bed to chair (including a wheelchair)?: A Little Help needed walking in hospital room?: A Little Help needed climbing 3-5 steps with a railing? : A Lot 6 Click Score: 15    End of Session Equipment Utilized During Treatment: Gait belt Activity Tolerance: Patient limited by fatigue Patient left: in chair;with chair alarm set;with call bell/phone within reach;with nursing/sitter in room;with SCD's reapplied(with geomat for pressure area on near gluteal cleft) Nurse Communication: Mobility status PT Visit Diagnosis: Other abnormalities of gait and mobility (R26.89);History of falling (Z91.81)     Time: 2060-1561 PT Time Calculation (min) (ACUTE ONLY): 26 min  Charges:  $Gait Training: 8-22 mins $Therapeutic Exercise: 8-22 mins                    Julien Girt, PT Acute Rehabilitation Services Pager [phone]  Office [phone]    Kadence Mikkelson D Ladashia Demarinis 09/01/2018, 1:40 PM

## 2018-09-02 ENCOUNTER — Encounter (HOSPITAL_COMMUNITY): Payer: Self-pay

## 2018-09-02 DIAGNOSIS — R197 Diarrhea, unspecified: Secondary | ICD-10-CM

## 2018-09-02 DIAGNOSIS — R627 Adult failure to thrive: Secondary | ICD-10-CM

## 2018-09-02 DIAGNOSIS — D649 Anemia, unspecified: Secondary | ICD-10-CM

## 2018-09-02 DIAGNOSIS — Z79899 Other long term (current) drug therapy: Secondary | ICD-10-CM

## 2018-09-02 DIAGNOSIS — R531 Weakness: Secondary | ICD-10-CM

## 2018-09-02 LAB — BASIC METABOLIC PANEL
ANION GAP: 9 (ref 5–15)
BUN: 7 mg/dL — ABNORMAL LOW (ref 8–23)
CHLORIDE: 110 mmol/L (ref 98–111)
CO2: 26 mmol/L (ref 22–32)
Calcium: 8 mg/dL — ABNORMAL LOW (ref 8.9–10.3)
Creatinine, Ser: 0.74 mg/dL (ref 0.44–1.00)
Glucose, Bld: 85 mg/dL (ref 70–99)
POTASSIUM: 3.7 mmol/L (ref 3.5–5.1)
SODIUM: 145 mmol/L (ref 135–145)

## 2018-09-02 LAB — CBC
HCT: 26.1 % — ABNORMAL LOW (ref 36.0–46.0)
HEMOGLOBIN: 8.6 g/dL — AB (ref 12.0–15.0)
MCH: 29.5 pg (ref 26.0–34.0)
MCHC: 33 g/dL (ref 30.0–36.0)
MCV: 89.4 fL (ref 78.0–100.0)
Platelets: 191 10*3/uL (ref 150–400)
RBC: 2.92 MIL/uL — AB (ref 3.87–5.11)
RDW: 19 % — ABNORMAL HIGH (ref 11.5–15.5)
WBC: 9.9 10*3/uL (ref 4.0–10.5)

## 2018-09-02 MED ORDER — ONDANSETRON 4 MG PO TBDP: 4.0000 mg | ORAL_TABLET | Freq: Three times a day (TID) | ORAL | 0 refills | Status: DC | PRN

## 2018-09-02 MED ORDER — LOPERAMIDE HCL 2 MG PO CAPS: 2.0000 mg | ORAL_CAPSULE | ORAL | 0 refills | Status: DC | PRN

## 2018-09-02 MED ORDER — HEPARIN SOD (PORK) LOCK FLUSH 100 UNIT/ML IV SOLN
250.0000 [IU] | INTRAVENOUS | Status: AC | PRN
  Administered 2018-09-02: 250 [IU]

## 2018-09-02 NOTE — Discharge Summary (Signed)
Physician Discharge Summary  Erica Escobar XFG:[id number] DOB: [birth year] DOA: 08/29/2018  PCP: Deland Pretty, MD  Admit date: 08/29/2018 Discharge date: 09/02/2018  Admitted From: Home Disposition: Home  Recommendations for Outpatient Follow-up:  1. Follow up with PCP in 1 week 2. Follow up with Oncology as scheduled 3. Please obtain BMP/CBC in one week 4. Please follow up on the following pending results: None  Home Health: PT, OT, RN, SW, nurse aide Equipment/Devices: None  Discharge Condition: Stable CODE STATUS: DNR Diet recommendation: Heart healthy   Brief/Interim Summary:  Admission HPI written by Toy Baker, MD   HPI: Erica Escobar is a 82 y.o. female with medical history significant of CKD, HLD, large cell lymphoma, colon cancer cancer S/P colectomy, COPD, GERD    Presented with fatigue and positional lightheadedness trouble ambulating, she have had a fall a few days ago cannot recollect how that happened unable to say if she hit her head or abdomen. Family feels it is likely her blood pressure went down and she collapsed.  Last time patient admitted in 1 September when she was evaluated for fatigue was found to be dehydrated and orthostatic AKI and severe constipation.  At the time of discharge in the fourth creatinine back to baseline of 0.8 her fecal impaction has resolved wax Patient has baseline anemia with hemoglobin at baseline around 9 Hx of hemorrhoids has been having some blood in his stools Yesterday she was seen in the emergency department for fall secondary to progressive generalized weakness for the past few weeks no associated chest pain or shortness of breath no fever In the emergency department yesterday she was noted to be pancytopenic platelets down to 104 WBC down to 0.2  ANC 0.0 and hemoglobin 8.3. Oncology has been consulted and felt that hemoglobin drop was secondary to recent chemotherapy given no fever patient was rehydrated  and discharged back to home.  CT head neck at that time nonacute she received 3 L of IV fluids And was able to be discharged home today she reports when she was seen by physical therapy her blood pressure bottom out she was sitting at the side of the bed and feeling very lightheaded when she tries to ambulate appeared to be very pale she has not been eating or drinking well patient and family called oncologist who recommended for her to come back to emergency department. Endorsing chronic burning chest pain and headache that has been throbbing. 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 currently undergoing radiation therapy she have had IV rehydration in the office per oncology on 5 September Cycle 2 CVP/Rituxan 08/22/2018,udenyca added and wants to receive another cycle in 3 weeks   While in ER: Noted to be still orthostatic while laying down blood pressure 120/60 sitting up blood pressure 90s over 40 heart rate 90 Hemoglobin noted to be down to 5.5  Type and screen was ordered for patient to be transfused 2 units The following Work up has been ordered so far:   Hospital course:  Generalized weakness Frequent falls Recurrent issue in setting of chemotherapy, orthostatic hypotension, anemia. PT evaluated and recommended SNF. Patient unable to discharge to SNF. Maximizing home health services.  Anemia Normocytic. In setting of lymphoma and chemotherapy. S/p 2 units of PRBC. Stable.  Orthostatic hypotension Given IV fluids. Orthostatic  vitals positive. Asymptomatic with ambulation. Precautions.  Large B-cell lymphoma Follows with Dr. Benay Spice as an outpatient. Currently undergoing chemotherapy and radiation therapy.  CKD stage III Baseline creatinine of 0.7.  Stable.  Leukopenia/Neutropenia Secondary to chemotherapy. Improved.  COPD Stable  Hyperlipidemia Continued gemfibrozil  Hypomagnesemia Continued magnesium  Diarrhea Frequent. Concern this may lead to dehydration. Associated hypernatremia. Started on Imodium. Improvement of symptoms.  Hypernatremia Secondary to GI losses in addition to receiving NS IV fluids. Improved with IV fluids and better oral intake.  Dark stool Per patient. No bright red blood. Fecal occult blood test negative. Hemoglobin stable. Outpatient follow-up.  Discharge Diagnoses:  Principal Problem:   Weakness Active Problems:   Cancer of ascending colon s/p robotic colectomy 02/15/2015   COPD (chronic obstructive pulmonary disease) (HCC)   Large cell lymphoma (HCC)   CKD (chronic kidney disease), stage III (HCC)   Hyperlipidemia   FTT (failure to thrive) in adult   Symptomatic anemia   Malnutrition of moderate degree    Discharge Instructions  Discharge Instructions    Call MD for:  persistant nausea and vomiting   Complete by:  As directed    Diet - low sodium heart healthy   Complete by:  As directed    Increase activity slowly   Complete by:  As directed      Allergies as of 09/02/2018      Reactions   Azithromycin Itching   Allopurinol Other (See Comments)   Unknown   Cephalexin Other (See Comments)   Unknown   Codeine Itching   Gabapentin Swelling, Other (See Comments)   Throat and leg swelling   Niaspan [niacin] Other (See Comments)   Unknown   Oxybutynin Other (See Comments)   "made me go crazy"-- had crazy dreams.    Paxil [paroxetine Hcl] Other (See Comments)   Unknown reaction   Penicillins Itching, Other (See Comments)   Has patient had a PCN reaction causing immediate rash, facial/tongue/throat swelling, SOB or lightheadedness with hypotension: No Has patient had a PCN reaction causing severe rash involving mucus membranes or skin necrosis: Yes Has patient had a PCN  reaction that required hospitalization: No Has patient had a PCN reaction occurring within the last 10 years: No If all of the above answers are "NO", then may proceed with Cephalosporin use.   Pentazocine Itching, Nausea Only, Other (See Comments)   Headache.    Tizanidine Other (See Comments)   "made me crazy"   Tramadol Other (See Comments)   Head feels "swimmy" and weakness   Vicodin [hydrocodone-acetaminophen] Other (See Comments)   "made me go crazy"      Medication List    TAKE these medications   aspirin EC 81 MG tablet Take 81 mg by mouth every morning.   ENSURE Take 237 mLs by mouth 2 (two) times daily between meals. VANILLA OR STRAWBERRY IF AVAILABLE PO DAILY   Fish Oil 1000 MG Caps Take 2,000 mg by mouth daily with breakfast.   gemfibrozil 600 MG tablet Commonly known as:  LOPID Take 600 mg by mouth every morning.   loperamide 2 MG capsule Commonly known as:  IMODIUM Take 1 capsule (2 mg total) by mouth as needed for diarrhea or loose stools.   loratadine 10 MG tablet Commonly known as:  CLARITIN Take 10 mg by mouth daily as needed for allergies.   mirtazapine 15 MG tablet Commonly known as:  REMERON Take 7.5 mg by mouth at bedtime.   multivitamin-lutein Caps capsule Take  1 capsule by mouth daily.   ondansetron 4 MG disintegrating tablet Commonly known as:  ZOFRAN-ODT Take 1 tablet (4 mg total) by mouth every 8 (eight) hours as needed for nausea or vomiting.   pantoprazole 40 MG tablet Commonly known as:  PROTONIX Take 40 mg by mouth daily.   polyethylene glycol packet Commonly known as:  MIRALAX / GLYCOLAX Take 17 g by mouth daily as needed for moderate constipation.   potassium chloride 10 MEQ tablet Commonly known as:  K-DUR Take 10 mEq by mouth daily.   promethazine 25 MG tablet Commonly known as:  PHENERGAN Take 0.5 tablets (12.5 mg total) by mouth every 8 (eight) hours as needed for nausea or vomiting.   REFRESH 1.4-0.6 % Soln Generic  drug:  Polyvinyl Alcohol-Povidone PF Place 1-6 drops into both eyes daily.   trolamine salicylate 10 % cream Commonly known as:  ASPERCREME Apply 1 application topically as needed for muscle pain (as needed for back pain).   ULORIC 40 MG tablet Generic drug:  febuxostat Take 40 mg by mouth daily.   vitamin B-12 1000 MCG tablet Commonly known as:  CYANOCOBALAMIN Take 1,000 mcg by mouth daily.      Follow-up Information    [facility] Follow up.   Specialty:  [place] Why:  [facility] nursing/physical/occupational therapy/aide/social worker Contact information: [address] [place] [address] [phone]        Deland Pretty, MD. Schedule an appointment as soon as possible for a visit in 1 week(s).   Specialty:  Internal Medicine Contact information: [address] [place] [address] [phone]        Ladell Pier, MD. Go on 09/12/2018.   Specialty:  Oncology Contact information: [place] [address] [phone]          Allergies  Allergen Reactions  . Azithromycin Itching  . Allopurinol Other (See Comments)    Unknown  . Cephalexin Other (See Comments)    Unknown  . Codeine Itching  . Gabapentin Swelling and Other (See Comments)    Throat and leg swelling  . Niaspan [Niacin] Other (See Comments)    Unknown  . Oxybutynin Other (See Comments)    "made me go crazy"-- had crazy dreams.   . Paxil [Paroxetine Hcl] Other (See Comments)    Unknown reaction  . Penicillins Itching and Other (See Comments)    Has patient had a PCN reaction causing immediate rash, facial/tongue/throat swelling, SOB or lightheadedness with hypotension: No Has patient had a PCN reaction causing severe rash involving mucus membranes or skin necrosis: Yes Has patient had a PCN reaction that required hospitalization: No Has patient had a PCN reaction occurring within the last 10 years: No If all  of the above answers are "NO", then may proceed with Cephalosporin use.   Marland Kitchen Pentazocine Itching, Nausea Only and Other (See Comments)    Headache.   . Tizanidine Other (See Comments)    "made me crazy"  . Tramadol Other (See Comments)    Head feels "swimmy" and weakness  . Vicodin [Hydrocodone-Acetaminophen] Other (See Comments)    "made me go crazy"    Consultations:  Hematology   Procedures/Studies: Ct Abdomen Pelvis Wo Contrast  Result Date: 08/14/2018 CLINICAL DATA:  82 year old female with history of nausea, vomiting and abdominal pain. History of lymphoma and colon cancer. EXAM: CT ABDOMEN AND PELVIS WITHOUT CONTRAST TECHNIQUE: Multidetector CT imaging of the abdomen and pelvis was performed  following the standard protocol without IV contrast. COMPARISON:  CT the abdomen and pelvis 07/12/2018. PET-CT 08/10/2018. FINDINGS: Lower chest: Calcifications of the aortic valve. Atherosclerotic calcifications in the descending thoracic aorta. Hepatobiliary: No definite cystic or solid hepatic lesions are noted on today's noncontrast CT examination. Status post cholecystectomy. Pancreas: No pancreatic mass or peripancreatic fluid or inflammatory changes are noted on today's noncontrast CT examination. Spleen: Calcified granuloma in the spleen.  Otherwise, unremarkable. Adrenals/Urinary Tract: Unenhanced appearance of kidneys and bilateral adrenal glands is normal. No hydroureteronephrosis. Urinary bladder is normal in appearance. Stomach/Bowel: Normal appearance of the stomach. No pathologic dilatation of small bowel or colon. Very large burden of well-formed stool in the rectum measuring 9.0 x 9.2 x 10.5 cm, strongly suggesting fecal impaction. Numerous colonic diverticulae are noted, without surrounding inflammatory changes to suggest an acute diverticulitis at this time. Postoperative changes of right hemicolectomy. Vascular/Lymphatic: Aortic atherosclerosis. No lymphadenopathy noted in the abdomen  or pelvis. Reproductive: Status post hysterectomy. Ovaries are not confidently identified may be surgically absent or atrophic. Other: No significant volume of ascites.  No pneumoperitoneum. Musculoskeletal: Again noted is a soft tissue mass in the inferior aspect of the right chest wall posteriorly (axial image 17 of series 2 and coronal image 79 of series 5) measuring 3.6 x 5.0 x 8.6 cm, smaller than the prior examination, with some destructive changes in the posterior aspect of the right tenth and eleventh ribs where there are pathologic fractures. There are no other aggressive appearing lytic or blastic lesions noted in the visualized portions of the skeleton. IMPRESSION: 1. Fecal impaction in the distal rectum. 2. No other acute findings are noted elsewhere in the abdomen or pelvis. 3. Colonic diverticulosis without evidence of acute diverticulitis at this time. 4. Interval decreased size of soft tissue mass in the inferior aspect of the right posterior chest wall which currently measures 3.6 x 5.0 x 8.6 cm. 5. Aortic atherosclerosis. 6. There are calcifications of the aortic valve. Echocardiographic correlation for evaluation of potential valvular dysfunction may be warranted if clinically indicated. 7. Additional incidental findings, as above. Electronically Signed   By: Vinnie Langton M.D.   On: 08/14/2018 20:21   Dg Chest 2 View  Result Date: 08/14/2018 CLINICAL DATA:  She has lymphoma and is being currently being treated for it. Generalized pain started last fall but 2 days it got worse to the point that she is unable to eat but no dental pain. States she has fallen multiple times. EXAM: CHEST - 2 VIEW COMPARISON:  Chest x-ray on 07/12/2018 FINDINGS: The patient has a RIGHT-sided PICC line, tip overlying the level of the superior vena cava. Shallow lung inflation accentuates heart size. The lungs are clear. No pulmonary edema. Degenerative changes are seen in thoracic spine. IMPRESSION: No active  cardiopulmonary disease. Electronically Signed   By: Nolon Nations M.D.   On: 08/14/2018 12:28   Dg Thoracic Spine 4v  Result Date: 08/14/2018 CLINICAL DATA:  She has lymphoma and is being currently being treated for it. Generalized pain started last fall but 2 days it got worse to the point that she is unable to eat but no dental pain. States she has fallen multiple times. EXAM: THORACIC SPINE - 4+ VIEW COMPARISON:  None. FINDINGS: There are mild degenerative changes throughout the thoracic spine. No acute fracture or traumatic subluxation. No suspicious lytic or blastic lesions are identified. RIGHT-sided PICC line tip overlies the superior vena cava. Surgical clips are in the RIGHT UPPER QUADRANT. IMPRESSION:  No evidence for acute  abnormality. Electronically Signed   By: Nolon Nations M.D.   On: 08/14/2018 12:27   Dg Lumbar Spine Complete  Result Date: 08/14/2018 CLINICAL DATA:  Low back pain. Status post fall 2 days ago. Initial encounter. EXAM: LUMBAR SPINE - COMPLETE 4+ VIEW COMPARISON:  Plain films lumbar spine 05/25/2018. FINDINGS: No fracture is identified. Convex right scoliosis and severe multilevel degenerative disease are stable in appearance. Large stool ball is noted in the rectum. IMPRESSION: No acute abnormality. Convex right scoliosis and advanced multilevel degenerative disease. Large stool ball in the rectum compatible with fecal impaction. Electronically Signed   By: Inge Rise M.D.   On: 08/14/2018 12:28   Dg Abd 1 View  Result Date: 08/29/2018 CLINICAL DATA:  Generalize weakness EXAM: ABDOMEN - 1 VIEW COMPARISON:  08/16/2018, CT 08/14/2018, PET-CT 08/10/2018 FINDINGS: Lung bases are clear. Surgical clips in the right upper quadrant. Nonobstructed gas pattern with scattered small and large bowel gas. Surgical changes in the right mid abdomen. No abnormal calcification. IMPRESSION: Nonobstructed bowel-gas pattern. Electronically Signed   By: Donavan Foil M.D.   On:  08/29/2018 21:52   Ct Head Wo Contrast  Result Date: 08/29/2018 CLINICAL DATA:  Generalized weakness with recent fall. EXAM: CT HEAD WITHOUT CONTRAST TECHNIQUE: Contiguous axial images were obtained from the base of the skull through the vertex without intravenous contrast. COMPARISON:  None. FINDINGS: Brain: There is no mass, hemorrhage or extra-axial collection. The size and configuration of the ventricles and extra-axial CSF spaces are normal. There is no acute or chronic infarction. There is hypoattenuation of the periventricular white matter, most commonly indicating chronic ischemic microangiopathy. Vascular: No abnormal hyperdensity of the major intracranial arteries or dural venous sinuses. No intracranial atherosclerosis. Skull: The visualized skull base, calvarium and extracranial soft tissues are normal. Sinuses/Orbits: Remote right mastoidectomy. The right sphenoid sinus is opacified. The orbits are normal. IMPRESSION: 1. No acute intracranial abnormality. 2. Chronic small vessel ischemia. Electronically Signed   By: Ulyses Jarred M.D.   On: 08/29/2018 19:15   Ct Head Wo Contrast  Result Date: 08/14/2018 CLINICAL DATA:  Multiple falls last week. EXAM: CT HEAD WITHOUT CONTRAST CT CERVICAL SPINE WITHOUT CONTRAST TECHNIQUE: Multidetector CT imaging of the head and cervical spine was performed following the standard protocol without intravenous contrast. Multiplanar CT image reconstructions of the cervical spine were also generated. COMPARISON:  CT head dated August 08, 2015. CT cervical spine dated March 10, 2009. FINDINGS: CT HEAD FINDINGS Brain: No evidence of acute infarction, hemorrhage, hydrocephalus, extra-axial collection or mass lesion/mass effect. Stable mild atrophy and chronic microvascular ischemic changes. Vascular: Atherosclerotic vascular calcification of the carotid siphons. No hyperdense vessel. Skull: Negative for fracture. Unchanged right parietal bone osteoma. Sinuses/Orbits: No  acute finding. Prior right mastoidectomy. Chronic opacification of the right sphenoid sinus. Other: None. CT CERVICAL SPINE FINDINGS Alignment: 3 mm anterolisthesis at C7-T1 due to facet arthropathy. No traumatic malalignment. Skull base and vertebrae: No acute fracture. No primary bone lesion or focal pathologic process. Soft tissues and spinal canal: No prevertebral fluid or swelling. No visible canal hematoma. Disc levels: Moderate multilevel disc height loss and facet uncovertebral hypertrophy throughout the cervical spine Upper chest: Negative. Other: None. IMPRESSION: 1. No acute intracranial abnormality. Stable mild atrophy and chronic microvascular ischemic changes. 2. No acute cervical spine fracture. Moderate cervical spondylosis, progressed since 2010. Electronically Signed   By: Titus Dubin M.D.   On: 08/14/2018 12:38   Ct Cervical Spine Wo Contrast  Result Date: 08/29/2018 CLINICAL DATA:  Generalized weakness with fall. EXAM: CT CERVICAL SPINE WITHOUT CONTRAST TECHNIQUE: Multidetector CT imaging of the cervical spine was performed without intravenous contrast. Multiplanar CT image reconstructions were also generated. COMPARISON:  08/14/2018. FINDINGS: Alignment: 3 mm of facet mediated anterolisthesis C7-T1, 1-2 mm of similar facet mediated slip C6-7. Skull base and vertebrae: No acute fracture. No primary bone lesion or focal pathologic process. Soft tissues and spinal canal: No prevertebral fluid or swelling. No visible canal hematoma. Disc levels: No acute findings. Multilevel disc space narrowing. Facet disease. Upper chest:  No pneumothorax or visible mass. Other: Similar appearance to most recent priors. IMPRESSION: Cervical spondylosis without acute findings. Electronically Signed   By: Staci Righter M.D.   On: 08/29/2018 19:15   Ct Cervical Spine Wo Contrast  Result Date: 08/14/2018 CLINICAL DATA:  Multiple falls last week. EXAM: CT HEAD WITHOUT CONTRAST CT CERVICAL SPINE WITHOUT  CONTRAST TECHNIQUE: Multidetector CT imaging of the head and cervical spine was performed following the standard protocol without intravenous contrast. Multiplanar CT image reconstructions of the cervical spine were also generated. COMPARISON:  CT head dated August 08, 2015. CT cervical spine dated March 10, 2009. FINDINGS: CT HEAD FINDINGS Brain: No evidence of acute infarction, hemorrhage, hydrocephalus, extra-axial collection or mass lesion/mass effect. Stable mild atrophy and chronic microvascular ischemic changes. Vascular: Atherosclerotic vascular calcification of the carotid siphons. No hyperdense vessel. Skull: Negative for fracture. Unchanged right parietal bone osteoma. Sinuses/Orbits: No acute finding. Prior right mastoidectomy. Chronic opacification of the right sphenoid sinus. Other: None. CT CERVICAL SPINE FINDINGS Alignment: 3 mm anterolisthesis at C7-T1 due to facet arthropathy. No traumatic malalignment. Skull base and vertebrae: No acute fracture. No primary bone lesion or focal pathologic process. Soft tissues and spinal canal: No prevertebral fluid or swelling. No visible canal hematoma. Disc levels: Moderate multilevel disc height loss and facet uncovertebral hypertrophy throughout the cervical spine Upper chest: Negative. Other: None. IMPRESSION: 1. No acute intracranial abnormality. Stable mild atrophy and chronic microvascular ischemic changes. 2. No acute cervical spine fracture. Moderate cervical spondylosis, progressed since 2010. Electronically Signed   By: Titus Dubin M.D.   On: 08/14/2018 12:38   Ct Abdomen Pelvis W Contrast  Result Date: 08/29/2018 CLINICAL DATA:  82 year old female with history of non-Hodgkin's lymphoma, colon cancer status post partial colectomy presents with gradual onset of fatigue and generalized weakness. EXAM: CT ABDOMEN AND PELVIS WITH CONTRAST TECHNIQUE: Multidetector CT imaging of the abdomen and pelvis was performed using the standard protocol  following bolus administration of intravenous contrast. CONTRAST:  192m ISOVUE-300 IOPAMIDOL (ISOVUE-300) INJECTION 61% COMPARISON:  08/14/2018 FINDINGS: Lower chest: Top-normal size heart without pericardial effusion. Small hiatal hernia. Trace bilateral pleural effusions with atelectasis. No pulmonary consolidation or suspicious pulmonary lesions. Hepatobiliary: Cholecystectomy. Homogeneous appearance of the liver without space-occupying mass. No biliary dilatation. Pancreas: Normal Spleen: Splenic granuloma.  Normal splenic size.  No enhancing mass. Adrenals/Urinary Tract: No adrenal mass. Cortical enhancement is symmetric. No obstructive uropathy. The urinary bladder is unremarkable. Stomach/Bowel: Stigmata of right partial colectomy. No bowel obstruction or inflammation. The stomach is decompressed in appearance. There is normal small bowel rotation. Colonic diverticulosis without acute diverticulitis is identified along the course of the descending and sigmoid colon. Probable small internal hemorrhoids accounting for smooth thickening of the anus and distal rectum. Vascular/Lymphatic: Aortoiliac atherosclerosis. No adenopathy by CT size criteria. Reproductive: Hysterectomy.  No adnexal mass. Other: No free air nor free fluid. Musculoskeletal: Further decrease in right paraspinal soft tissue  mass with adjacent lytic abnormality of the right tenth and eleventh ribs, currently 4.2 x 3.1 x 6.8 cm cm versus 3.6 x 5 x 8.6 cm at the same level previously. IMPRESSION: 1. Trace bilateral pleural effusions with atelectasis. 2. Smaller right paraspinal soft tissue mass with areas of lytic bone destruction redemonstrated involving the posterior right tenth and eleventh ribs. 3. No acute solid nor hollow visceral organ abnormality. Status post right partial colectomy. Electronically Signed   By: Ashley Royalty M.D.   On: 08/29/2018 22:58   Nm Pet Image Initial (pi) Skull Base To Thigh  Result Date: 08/11/2018 CLINICAL  DATA:  Initial treatment strategy for diffuse large B-cell lymphoma. EXAM: NUCLEAR MEDICINE PET SKULL BASE TO THIGH TECHNIQUE: 7.3 mCi F-18 FDG was injected intravenously. Full-ring PET imaging was performed from the skull base to thigh after the radiotracer. CT data was obtained and used for attenuation correction and anatomic localization. Fasting blood glucose: 115 mg/dl COMPARISON:  CT 07/12/2018 FINDINGS: Mediastinal blood pool activity: SUV max 3.1 NECK: No hypermetabolic lymph nodes in the neck. Incidental CT findings: none CHEST: No hypermetabolic mediastinal or hilar nodes. No suspicious pulmonary nodules on the CT scan. Short segment of hypermetabolic activity in the distal esophagus approximately 3 cm above the GE junction. No additional abnormal activity esophagus. No mass lesion or obstruction identified on the CT portion exam Incidental CT findings: RIGHT PICC line noted. ABDOMEN/PELVIS: No abnormal hypermetabolic activity within the liver, pancreas, adrenal glands, or spleen. No hypermetabolic lymph nodes in the abdomen or pelvis. RIGHT hemicolectomy. Metabolic activity associated with hustral contraction in the transverse colon is favored benign. Incidental CT findings: Post hysterectomy SKELETON: The RIGHT paraspinal mass at the T10 - T12 vertebral body level is decreased significantly in size measuring 3.3 cm in thickness compared to 5.4 cm in thickness on comparison exam. The activity of through this region is low with SUV max equal 4.4 (reference liver activity equal SUV max 3.9). There is metabolic activity associated with the paraspinal musculature superiorly and inferior to the mass lesion extending from the sacrum all the way to the perispinal neck musculature. This is favored physiologic/traumatic/inflammatory (benign). Vertebral body bone destruction noted centrally within the RIGHT paraspinal mass as described previously. Incidental CT findings: none IMPRESSION: 1. Relatively mild  metabolic activity ( [place] 3/4) within the RIGHT paraspinal mass which is decreased significantly in size from comparison exam is consistent positive response to first cycle chemotherapy. 2. No evidence of lymphoma elsewhere on the skull base to thigh FDG PET scan. 3. Focal activity within the distal esophagus above the GE junction without mass lesion or obstruction. Differential includes focal esophagitis versus CT occult neoplasm. Consider upper endoscopy. 4. Diffuse right paraspinal musculature metabolic activity superior and inferior to the right paraspinal mass on the RIGHT is favored benign as described above. Electronically Signed   By: Suzy Bouchard M.D.   On: 08/11/2018 09:34   Dg Chest Port 1 View  Result Date: 08/28/2018 CLINICAL DATA:  Fall this morning, increasing weakness. History of lymphoma. EXAM: PORTABLE CHEST 1 VIEW COMPARISON:  Chest x-rays dated 08/14/2018 and 07/12/2018. FINDINGS: Heart size and mediastinal contours are stable. Lungs are clear. No pleural effusion or pneumothorax seen. RIGHT-sided PICC line appears appropriately position with tip at the level of the lower SVC. Osseous structures about the chest are unremarkable. IMPRESSION: No acute findings. No evidence of pneumonia or pulmonary edema. No osseous fracture or dislocation seen. Electronically Signed   By: Roxy Horseman.D.  On: 08/28/2018 11:35   Dg Abd Portable 1v  Result Date: 08/16/2018 CLINICAL DATA:  Constipation EXAM: PORTABLE ABDOMEN - 1 VIEW COMPARISON:  None. FINDINGS: The bowel gas pattern is normal. No radio-opaque calculi or other significant radiographic abnormality are seen. Dextroscoliosis of the thoracolumbar spine. IMPRESSION: Negative. Electronically Signed   By: Kathreen Devoid   On: 08/16/2018 11:28      Subjective: Some nausea today. No vomiting. Improvement of diarrhea.  Discharge Exam: Vitals:   09/02/18 0900 09/02/18 0902  BP: 119/84 104/61  Pulse: 95 (!) 103  Resp: 16 18  Temp:     SpO2: 98% 97%   Vitals:   09/02/18 0550 09/02/18 0858 09/02/18 0900 09/02/18 0902  BP: (!) 156/77 (!) 161/78 119/84 104/61  Pulse: 78 83 95 (!) 103  Resp: 18 16 16 18   Temp: 98.4 F (36.9 C) 98.3 F (36.8 C)    TempSrc: Oral Oral    SpO2: 100% 97% 98% 97%  Weight:      Height:        General: Pt is alert, awake, not in acute distress Cardiovascular: RRR, S1/S2 +, no rubs, no gallops Respiratory: CTA bilaterally, no wheezing, no rhonchi Abdominal: Soft, NT, ND, bowel sounds + Extremities: no edema, no cyanosis    The results of significant diagnostics from this hospitalization (including imaging, microbiology, ancillary and laboratory) are listed below for reference.     Microbiology: Recent Results (from the past 240 hour(s))  C difficile quick scan w PCR reflex     Status: None   Collection Time: 08/30/18  5:20 PM  Result Value Ref Range Status   C Diff antigen NEGATIVE NEGATIVE Final   C Diff toxin NEGATIVE NEGATIVE Final   C Diff interpretation No C. difficile detected.  Final    Comment: Performed at [facility], [place] [address] [place], [place] [address]     Labs: BNP (last 3 results) No results for input(s): BNP in the last 8760 hours. Basic Metabolic Panel: Recent Labs  Lab 08/29/18 1700 08/30/18 0520 08/31/18 0317 09/01/18 0333 09/02/18 0411  NA 139 143 145 147* 145  K 3.6 3.7 3.3* 3.7 3.7  CL 105 107 112* 113* 110  CO2 27 26 25 24 26   GLUCOSE 85 79 81 75 85  BUN 13 11 10 8  7*  CREATININE 0.76 0.76 0.70 0.71 0.74  CALCIUM 8.0* 8.1* 7.8* 8.0* 8.0*  MG  --  1.6* 1.9  --   --   PHOS  --  3.6  --   --   --    Liver Function Tests: Recent Labs  Lab 08/30/18 0520  AST 12*  ALT 10  ALKPHOS 57  BILITOT 1.7*  PROT 4.6*  ALBUMIN 2.1*   No results for input(s): LIPASE, AMYLASE in the last 168 hours. No results for input(s): AMMONIA in the last 168 hours. CBC: Recent Labs  Lab 08/28/18 0801 08/29/18 1700 08/30/18 0520  08/31/18 0317 09/01/18 1602 09/02/18 0411  WBC 0.2* 1.0* 2.9* 5.7 9.4 9.9  NEUTROABS 0.0* 0.3* 2.0 3.5  --   --   HGB 8.3* 5.5* 8.1* 8.0* 8.5* 8.6*  HCT 24.0* 15.9* 23.3* 23.9* 25.5* 26.1*  MCV 89.2 88.8 86.0 86.3 89.2 89.4  PLT 104* 121* 133* 157 166 191   Cardiac Enzymes: Recent Labs  Lab 08/29/18 2036 08/30/18 0520 08/30/18 1236  TROPONINI 0.03* <0.03 <0.03   Urinalysis    Component Value Date/Time   COLORURINE YELLOW 08/28/2018 0753  APPEARANCEUR HAZY (A) 08/28/2018 0753   LABSPEC 1.016 08/28/2018 0753   PHURINE 5.0 08/28/2018 0753   GLUCOSEU NEGATIVE 08/28/2018 0753   HGBUR NEGATIVE 08/28/2018 0753   BILIRUBINUR NEGATIVE 08/28/2018 0753   KETONESUR NEGATIVE 08/28/2018 0753   PROTEINUR NEGATIVE 08/28/2018 0753   UROBILINOGEN 0.2 03/10/2009 1608   NITRITE NEGATIVE 08/28/2018 0753   LEUKOCYTESUR NEGATIVE 08/28/2018 0753   Microbiology Recent Results (from the past 240 hour(s))  C difficile quick scan w PCR reflex     Status: None   Collection Time: 08/30/18  5:20 PM  Result Value Ref Range Status   C Diff antigen NEGATIVE NEGATIVE Final   C Diff toxin NEGATIVE NEGATIVE Final   C Diff interpretation No C. difficile detected.  Final    Comment: Performed at [facility], [place] [address], [place], [place] [address]     SIGNED:   Cordelia Poche, MD [facility] 09/02/2018, 2:19 PM

## 2018-09-02 NOTE — Care Management Note (Signed)
Case Management Note  Patient Details  Name: Erica Escobar MRN: [id number] Date of Birth: [birth year]  Subjective/Objective: Patient states she wil d/c home w/HHC-[facility] already active rep Santiago Glad aware of d/c today & [place] orders. Patient states her son Carloyn Manner [phone] will pick her up @ d/c. No further CM needs.                   Action/Plan:d/c home w/HHC.   Expected Discharge Date:                  Expected Discharge Plan:  [facility]  In-House Referral:     Discharge planning Services  CM Consult  Post Acute Care Choice:  Durable Medical Equipment(has rw;Active w/[facility]-HHRN/PT/OT) Choice offered to:  Patient  DME Arranged:    DME Agency:     HH Arranged:  RN, PT, Nurse's Aide, OT, Social Work [employer] Agency:  [place]  Status of Service:  Completed, signed off  If discussed at [employer], dates discussed:    Additional Comments:  Dessa Phi, RN 09/02/2018, 11:15 AM

## 2018-09-02 NOTE — Discharge Instructions (Addendum)
Erica Escobar,  You were admitted because of weakness and falling. You also had some diarrhea issues. Your symptoms have improved. Please continue to take god oral hydration. Please follow-up with your primary care physician and Dr. Benay Spice.

## 2018-09-02 NOTE — Progress Notes (Signed)
[name] was still having some diarrhea yesterday.  She says she had about 4 or 5 episodes of diarrhea.  Still not clear as to what the cause of the diarrhea is.  Maybe it was the chemotherapy that she received but typically I would think that constipation would have been more the norm.  She has some slight abdominal discomfort.  There is no melena.  Her labs show a sodium of 145.  Potassium 3.7.  BUN 7 creatinine 0.74.  White cell count 9.9.  Hemoglobin 8.6.  Platelet count 191,000.  She is on some Imodium.  She is had no fever.  I think it would be reasonable to get a urinalysis on her make sure she does not have a UTI.  Her vital signs show temperature of 98.4.  Pulse 78.  Blood pressure 156/77.  Her abdomen is soft.  Bowel sounds are present.  They are not hyperactive.  She has no guarding or rebound tenderness.  There is no fluid wave.  She has no palpable liver or spleen tip.  Cardiac exam regular rate and rhythm.  There is 1/6 systolic murmur.  Lungs are clear.  She has no wheezes.  Oral exam shows no mucositis.  Hopefully, this diarrhea is improving.  Her blood counts seem to be trending upward which is nice to see. If the diarrhea continues, she may need to have a enteric pathogen panel done.  I know she is C. difficile negative.  Sometimes, Flagyl can still be effective.  We will continue to follow her along.  Dr. Benay Spice [place] another week or so.  Lattie Haw, MD  Darlyn Chamber 33:6

## 2018-09-03 DIAGNOSIS — G2581 Restless legs syndrome: Secondary | ICD-10-CM | POA: Diagnosis not present

## 2018-09-03 DIAGNOSIS — E785 Hyperlipidemia, unspecified: Secondary | ICD-10-CM | POA: Diagnosis not present

## 2018-09-03 DIAGNOSIS — M1990 Unspecified osteoarthritis, unspecified site: Secondary | ICD-10-CM | POA: Diagnosis not present

## 2018-09-03 DIAGNOSIS — J449 Chronic obstructive pulmonary disease, unspecified: Secondary | ICD-10-CM | POA: Diagnosis not present

## 2018-09-03 DIAGNOSIS — K219 Gastro-esophageal reflux disease without esophagitis: Secondary | ICD-10-CM | POA: Diagnosis not present

## 2018-09-03 DIAGNOSIS — Z9181 History of falling: Secondary | ICD-10-CM | POA: Diagnosis not present

## 2018-09-03 DIAGNOSIS — N183 Chronic kidney disease, stage 3 (moderate): Secondary | ICD-10-CM | POA: Diagnosis not present

## 2018-09-03 DIAGNOSIS — Z85038 Personal history of other malignant neoplasm of large intestine: Secondary | ICD-10-CM | POA: Diagnosis not present

## 2018-09-03 DIAGNOSIS — I951 Orthostatic hypotension: Secondary | ICD-10-CM | POA: Diagnosis not present

## 2018-09-03 DIAGNOSIS — I129 Hypertensive chronic kidney disease with stage 1 through stage 4 chronic kidney disease, or unspecified chronic kidney disease: Secondary | ICD-10-CM | POA: Diagnosis not present

## 2018-09-03 DIAGNOSIS — Z87891 Personal history of nicotine dependence: Secondary | ICD-10-CM | POA: Diagnosis not present

## 2018-09-03 DIAGNOSIS — Z7982 Long term (current) use of aspirin: Secondary | ICD-10-CM | POA: Diagnosis not present

## 2018-09-03 DIAGNOSIS — E860 Dehydration: Secondary | ICD-10-CM | POA: Diagnosis not present

## 2018-09-03 DIAGNOSIS — C8330 Diffuse large B-cell lymphoma, unspecified site: Secondary | ICD-10-CM | POA: Diagnosis not present

## 2018-09-03 DIAGNOSIS — D631 Anemia in chronic kidney disease: Secondary | ICD-10-CM | POA: Diagnosis not present

## 2018-09-03 DIAGNOSIS — K5900 Constipation, unspecified: Secondary | ICD-10-CM | POA: Diagnosis not present

## 2018-09-05 DIAGNOSIS — I951 Orthostatic hypotension: Secondary | ICD-10-CM | POA: Diagnosis not present

## 2018-09-05 DIAGNOSIS — E785 Hyperlipidemia, unspecified: Secondary | ICD-10-CM | POA: Diagnosis not present

## 2018-09-05 DIAGNOSIS — C8330 Diffuse large B-cell lymphoma, unspecified site: Secondary | ICD-10-CM | POA: Diagnosis not present

## 2018-09-05 DIAGNOSIS — Z85038 Personal history of other malignant neoplasm of large intestine: Secondary | ICD-10-CM | POA: Diagnosis not present

## 2018-09-05 DIAGNOSIS — K219 Gastro-esophageal reflux disease without esophagitis: Secondary | ICD-10-CM | POA: Diagnosis not present

## 2018-09-05 DIAGNOSIS — I129 Hypertensive chronic kidney disease with stage 1 through stage 4 chronic kidney disease, or unspecified chronic kidney disease: Secondary | ICD-10-CM | POA: Diagnosis not present

## 2018-09-05 DIAGNOSIS — K5900 Constipation, unspecified: Secondary | ICD-10-CM | POA: Diagnosis not present

## 2018-09-05 DIAGNOSIS — Z87891 Personal history of nicotine dependence: Secondary | ICD-10-CM | POA: Diagnosis not present

## 2018-09-05 DIAGNOSIS — E860 Dehydration: Secondary | ICD-10-CM | POA: Diagnosis not present

## 2018-09-05 DIAGNOSIS — Z9181 History of falling: Secondary | ICD-10-CM | POA: Diagnosis not present

## 2018-09-05 DIAGNOSIS — Z7982 Long term (current) use of aspirin: Secondary | ICD-10-CM | POA: Diagnosis not present

## 2018-09-05 DIAGNOSIS — J449 Chronic obstructive pulmonary disease, unspecified: Secondary | ICD-10-CM | POA: Diagnosis not present

## 2018-09-05 DIAGNOSIS — G2581 Restless legs syndrome: Secondary | ICD-10-CM | POA: Diagnosis not present

## 2018-09-05 DIAGNOSIS — N183 Chronic kidney disease, stage 3 (moderate): Secondary | ICD-10-CM | POA: Diagnosis not present

## 2018-09-05 DIAGNOSIS — D631 Anemia in chronic kidney disease: Secondary | ICD-10-CM | POA: Diagnosis not present

## 2018-09-05 DIAGNOSIS — M1990 Unspecified osteoarthritis, unspecified site: Secondary | ICD-10-CM | POA: Diagnosis not present

## 2018-09-06 DIAGNOSIS — K219 Gastro-esophageal reflux disease without esophagitis: Secondary | ICD-10-CM | POA: Diagnosis not present

## 2018-09-06 DIAGNOSIS — Z87891 Personal history of nicotine dependence: Secondary | ICD-10-CM | POA: Diagnosis not present

## 2018-09-06 DIAGNOSIS — Z9181 History of falling: Secondary | ICD-10-CM | POA: Diagnosis not present

## 2018-09-06 DIAGNOSIS — M1990 Unspecified osteoarthritis, unspecified site: Secondary | ICD-10-CM | POA: Diagnosis not present

## 2018-09-06 DIAGNOSIS — I129 Hypertensive chronic kidney disease with stage 1 through stage 4 chronic kidney disease, or unspecified chronic kidney disease: Secondary | ICD-10-CM | POA: Diagnosis not present

## 2018-09-06 DIAGNOSIS — Z85038 Personal history of other malignant neoplasm of large intestine: Secondary | ICD-10-CM | POA: Diagnosis not present

## 2018-09-06 DIAGNOSIS — C8330 Diffuse large B-cell lymphoma, unspecified site: Secondary | ICD-10-CM | POA: Diagnosis not present

## 2018-09-06 DIAGNOSIS — E860 Dehydration: Secondary | ICD-10-CM | POA: Diagnosis not present

## 2018-09-06 DIAGNOSIS — K5900 Constipation, unspecified: Secondary | ICD-10-CM | POA: Diagnosis not present

## 2018-09-06 DIAGNOSIS — D631 Anemia in chronic kidney disease: Secondary | ICD-10-CM | POA: Diagnosis not present

## 2018-09-06 DIAGNOSIS — I951 Orthostatic hypotension: Secondary | ICD-10-CM | POA: Diagnosis not present

## 2018-09-06 DIAGNOSIS — G2581 Restless legs syndrome: Secondary | ICD-10-CM | POA: Diagnosis not present

## 2018-09-06 DIAGNOSIS — J449 Chronic obstructive pulmonary disease, unspecified: Secondary | ICD-10-CM | POA: Diagnosis not present

## 2018-09-06 DIAGNOSIS — E785 Hyperlipidemia, unspecified: Secondary | ICD-10-CM | POA: Diagnosis not present

## 2018-09-06 DIAGNOSIS — N183 Chronic kidney disease, stage 3 (moderate): Secondary | ICD-10-CM | POA: Diagnosis not present

## 2018-09-06 DIAGNOSIS — Z7982 Long term (current) use of aspirin: Secondary | ICD-10-CM | POA: Diagnosis not present

## 2018-09-07 DIAGNOSIS — Z9181 History of falling: Secondary | ICD-10-CM | POA: Diagnosis not present

## 2018-09-07 DIAGNOSIS — C8330 Diffuse large B-cell lymphoma, unspecified site: Secondary | ICD-10-CM | POA: Diagnosis not present

## 2018-09-07 DIAGNOSIS — M1990 Unspecified osteoarthritis, unspecified site: Secondary | ICD-10-CM | POA: Diagnosis not present

## 2018-09-07 DIAGNOSIS — I129 Hypertensive chronic kidney disease with stage 1 through stage 4 chronic kidney disease, or unspecified chronic kidney disease: Secondary | ICD-10-CM | POA: Diagnosis not present

## 2018-09-07 DIAGNOSIS — N183 Chronic kidney disease, stage 3 (moderate): Secondary | ICD-10-CM | POA: Diagnosis not present

## 2018-09-07 DIAGNOSIS — I951 Orthostatic hypotension: Secondary | ICD-10-CM | POA: Diagnosis not present

## 2018-09-07 DIAGNOSIS — K5900 Constipation, unspecified: Secondary | ICD-10-CM | POA: Diagnosis not present

## 2018-09-07 DIAGNOSIS — D631 Anemia in chronic kidney disease: Secondary | ICD-10-CM | POA: Diagnosis not present

## 2018-09-07 DIAGNOSIS — Z85038 Personal history of other malignant neoplasm of large intestine: Secondary | ICD-10-CM | POA: Diagnosis not present

## 2018-09-07 DIAGNOSIS — E785 Hyperlipidemia, unspecified: Secondary | ICD-10-CM | POA: Diagnosis not present

## 2018-09-07 DIAGNOSIS — J449 Chronic obstructive pulmonary disease, unspecified: Secondary | ICD-10-CM | POA: Diagnosis not present

## 2018-09-07 DIAGNOSIS — Z87891 Personal history of nicotine dependence: Secondary | ICD-10-CM | POA: Diagnosis not present

## 2018-09-07 DIAGNOSIS — G2581 Restless legs syndrome: Secondary | ICD-10-CM | POA: Diagnosis not present

## 2018-09-07 DIAGNOSIS — E860 Dehydration: Secondary | ICD-10-CM | POA: Diagnosis not present

## 2018-09-07 DIAGNOSIS — K219 Gastro-esophageal reflux disease without esophagitis: Secondary | ICD-10-CM | POA: Diagnosis not present

## 2018-09-07 DIAGNOSIS — Z7982 Long term (current) use of aspirin: Secondary | ICD-10-CM | POA: Diagnosis not present

## 2018-09-07 NOTE — Progress Notes (Signed)
Nutrition Assessment   Reason for Assessment:   Patient identified on Malnutrition Screening report for poor appetite and weight loss   ASSESSMENT:   82 year old female with non-hogdkin lymphoma.  Patient currently receiving chemotherapy of CVP/rituximab (last on 9/9).  Noted patient recently in admitted to hospital after fall due to weakness, anemia. Past medical history of colon cancer, s/p right colectomy in 2016, GERD, COPD,CKD stage III,HLD  Called patient via phone and introduced self and service.  Patient reports appetite is poor ever since being in the hospital  (9/15-9/20).  Reports that she took few bites of ham sandwich this am and felt sick on stomach.  Has not taken any nausea medication.  Drinks ensure/boost/carnation instant breakfast at times.  Reports that she is having diarrhea unable to tell RD how many times per day. Reports medicine she takes helps her (?imodium).    Nutrition Focused Physical Exam: unable to perform   Medications: imodium, remeron, MVI, zofran, protonix, phenergan, Vit B12   Labs: reviewed   Weight: 151 lb on 9/16 (hospital wt) 9/9 144 lb (cancer ctr wt) 8/5 151 lb Height: 63 inches BMI 26    NUTRITION DIAGNOSIS: Inadequate oral intake related to nausea, poor appetite as evidenced by patient compliant of feeling sick to stomach, nausea   MALNUTRITION DIAGNOSIS: unable to determine   INTERVENTION:  Offered nutrition visit/service and patient declined appointment at this time.  Encouraged taking nausea medication on more regular basis to see if improves symptoms Encouraged small frequent meals Encouraged continued intake of shakes Encouraged medication to help control diarrhea and to further discuss with Dr. Benay Spice on 9/30 upcoming appt    Next Visit: patient to reach out to RD contact information given to patient or MD can refer patient to nutrition services for further help  Lazette Estala B. Zenia Resides, [place], [place] Registered  Dietitian [phone] (pager)

## 2018-09-08 DIAGNOSIS — E860 Dehydration: Secondary | ICD-10-CM | POA: Diagnosis not present

## 2018-09-08 DIAGNOSIS — K219 Gastro-esophageal reflux disease without esophagitis: Secondary | ICD-10-CM | POA: Diagnosis not present

## 2018-09-08 DIAGNOSIS — Z7982 Long term (current) use of aspirin: Secondary | ICD-10-CM | POA: Diagnosis not present

## 2018-09-08 DIAGNOSIS — J449 Chronic obstructive pulmonary disease, unspecified: Secondary | ICD-10-CM | POA: Diagnosis not present

## 2018-09-08 DIAGNOSIS — D631 Anemia in chronic kidney disease: Secondary | ICD-10-CM | POA: Diagnosis not present

## 2018-09-08 DIAGNOSIS — N183 Chronic kidney disease, stage 3 (moderate): Secondary | ICD-10-CM | POA: Diagnosis not present

## 2018-09-08 DIAGNOSIS — I951 Orthostatic hypotension: Secondary | ICD-10-CM | POA: Diagnosis not present

## 2018-09-08 DIAGNOSIS — I129 Hypertensive chronic kidney disease with stage 1 through stage 4 chronic kidney disease, or unspecified chronic kidney disease: Secondary | ICD-10-CM | POA: Diagnosis not present

## 2018-09-08 DIAGNOSIS — Z85038 Personal history of other malignant neoplasm of large intestine: Secondary | ICD-10-CM | POA: Diagnosis not present

## 2018-09-08 DIAGNOSIS — E785 Hyperlipidemia, unspecified: Secondary | ICD-10-CM | POA: Diagnosis not present

## 2018-09-08 DIAGNOSIS — Z9181 History of falling: Secondary | ICD-10-CM | POA: Diagnosis not present

## 2018-09-08 DIAGNOSIS — G2581 Restless legs syndrome: Secondary | ICD-10-CM | POA: Diagnosis not present

## 2018-09-08 DIAGNOSIS — K5900 Constipation, unspecified: Secondary | ICD-10-CM | POA: Diagnosis not present

## 2018-09-08 DIAGNOSIS — C8330 Diffuse large B-cell lymphoma, unspecified site: Secondary | ICD-10-CM | POA: Diagnosis not present

## 2018-09-08 DIAGNOSIS — M1990 Unspecified osteoarthritis, unspecified site: Secondary | ICD-10-CM | POA: Diagnosis not present

## 2018-09-08 DIAGNOSIS — Z87891 Personal history of nicotine dependence: Secondary | ICD-10-CM | POA: Diagnosis not present

## 2018-09-09 DIAGNOSIS — I951 Orthostatic hypotension: Secondary | ICD-10-CM | POA: Diagnosis not present

## 2018-09-11 ENCOUNTER — Other Ambulatory Visit: Payer: Self-pay | Admitting: Oncology

## 2018-09-12 ENCOUNTER — Inpatient Hospital Stay (HOSPITAL_BASED_OUTPATIENT_CLINIC_OR_DEPARTMENT_OTHER): Payer: Medicare Other | Admitting: Oncology

## 2018-09-12 ENCOUNTER — Inpatient Hospital Stay: Payer: Medicare Other

## 2018-09-12 ENCOUNTER — Telehealth: Payer: Self-pay | Admitting: Oncology

## 2018-09-12 ENCOUNTER — Encounter: Payer: Medicare Other | Admitting: Nutrition

## 2018-09-12 VITALS — BP 124/72 | HR 73 | Temp 98.0°F | Resp 17 | Ht 63.0 in | Wt 146.3 lb

## 2018-09-12 DIAGNOSIS — Z7689 Persons encountering health services in other specified circumstances: Secondary | ICD-10-CM | POA: Diagnosis not present

## 2018-09-12 DIAGNOSIS — C8580 Other specified types of non-Hodgkin lymphoma, unspecified site: Secondary | ICD-10-CM

## 2018-09-12 DIAGNOSIS — C8330 Diffuse large B-cell lymphoma, unspecified site: Secondary | ICD-10-CM

## 2018-09-12 DIAGNOSIS — Z9049 Acquired absence of other specified parts of digestive tract: Secondary | ICD-10-CM | POA: Diagnosis not present

## 2018-09-12 DIAGNOSIS — C8339 Diffuse large B-cell lymphoma, extranodal and solid organ sites: Secondary | ICD-10-CM | POA: Diagnosis not present

## 2018-09-12 DIAGNOSIS — Z79899 Other long term (current) drug therapy: Secondary | ICD-10-CM

## 2018-09-12 DIAGNOSIS — Z87891 Personal history of nicotine dependence: Secondary | ICD-10-CM | POA: Diagnosis not present

## 2018-09-12 DIAGNOSIS — Z85828 Personal history of other malignant neoplasm of skin: Secondary | ICD-10-CM

## 2018-09-12 DIAGNOSIS — K219 Gastro-esophageal reflux disease without esophagitis: Secondary | ICD-10-CM | POA: Diagnosis not present

## 2018-09-12 DIAGNOSIS — E860 Dehydration: Secondary | ICD-10-CM

## 2018-09-12 DIAGNOSIS — E785 Hyperlipidemia, unspecified: Secondary | ICD-10-CM | POA: Diagnosis not present

## 2018-09-12 DIAGNOSIS — R627 Adult failure to thrive: Secondary | ICD-10-CM | POA: Diagnosis not present

## 2018-09-12 DIAGNOSIS — Z5112 Encounter for antineoplastic immunotherapy: Secondary | ICD-10-CM | POA: Diagnosis not present

## 2018-09-12 DIAGNOSIS — Z90710 Acquired absence of both cervix and uterus: Secondary | ICD-10-CM | POA: Diagnosis not present

## 2018-09-12 DIAGNOSIS — J449 Chronic obstructive pulmonary disease, unspecified: Secondary | ICD-10-CM | POA: Diagnosis not present

## 2018-09-12 DIAGNOSIS — G2581 Restless legs syndrome: Secondary | ICD-10-CM | POA: Diagnosis not present

## 2018-09-12 DIAGNOSIS — Z23 Encounter for immunization: Secondary | ICD-10-CM

## 2018-09-12 DIAGNOSIS — N189 Chronic kidney disease, unspecified: Secondary | ICD-10-CM

## 2018-09-12 DIAGNOSIS — H409 Unspecified glaucoma: Secondary | ICD-10-CM | POA: Diagnosis not present

## 2018-09-12 DIAGNOSIS — I129 Hypertensive chronic kidney disease with stage 1 through stage 4 chronic kidney disease, or unspecified chronic kidney disease: Secondary | ICD-10-CM

## 2018-09-12 DIAGNOSIS — G4700 Insomnia, unspecified: Secondary | ICD-10-CM | POA: Diagnosis not present

## 2018-09-12 DIAGNOSIS — F329 Major depressive disorder, single episode, unspecified: Secondary | ICD-10-CM | POA: Diagnosis not present

## 2018-09-12 DIAGNOSIS — Z85038 Personal history of other malignant neoplasm of large intestine: Secondary | ICD-10-CM | POA: Diagnosis not present

## 2018-09-12 DIAGNOSIS — I700 Atherosclerosis of aorta: Secondary | ICD-10-CM | POA: Diagnosis not present

## 2018-09-12 LAB — CBC WITH DIFFERENTIAL (CANCER CENTER ONLY)
Basophils Absolute: 0.1 10*3/uL (ref 0.0–0.1)
Basophils Relative: 1 %
EOS ABS: 0 10*3/uL (ref 0.0–0.5)
Eosinophils Relative: 0 %
HCT: 28.9 % — ABNORMAL LOW (ref 34.8–46.6)
HEMOGLOBIN: 9.7 g/dL — AB (ref 11.6–15.9)
LYMPHS ABS: 1.8 10*3/uL (ref 0.9–3.3)
Lymphocytes Relative: 29 %
MCH: 31.4 pg (ref 25.1–34.0)
MCHC: 33.6 g/dL (ref 31.5–36.0)
MCV: 93.3 fL (ref 79.5–101.0)
MONOS PCT: 15 %
Monocytes Absolute: 0.9 10*3/uL (ref 0.1–0.9)
NEUTROS PCT: 55 %
Neutro Abs: 3.5 10*3/uL (ref 1.5–6.5)
Platelet Count: 289 10*3/uL (ref 145–400)
RBC: 3.1 MIL/uL — AB (ref 3.70–5.45)
RDW: 25.3 % — ABNORMAL HIGH (ref 11.2–14.5)
WBC: 6.2 10*3/uL (ref 3.9–10.3)

## 2018-09-12 LAB — CMP (CANCER CENTER ONLY)
ALK PHOS: 105 U/L (ref 38–126)
ALT: 13 U/L (ref 0–44)
AST: 21 U/L (ref 15–41)
Albumin: 2.6 g/dL — ABNORMAL LOW (ref 3.5–5.0)
Anion gap: 8 (ref 5–15)
BUN: 11 mg/dL (ref 8–23)
CHLORIDE: 108 mmol/L (ref 98–111)
CO2: 27 mmol/L (ref 22–32)
CREATININE: 0.8 mg/dL (ref 0.44–1.00)
Calcium: 8.5 mg/dL — ABNORMAL LOW (ref 8.9–10.3)
GFR, Est AFR Am: >60 mL/min (ref >60)
GFR, Estimated: >60 mL/min (ref >60)
Glucose, Bld: 118 mg/dL — ABNORMAL HIGH (ref 70–99)
Potassium: 3.8 mmol/L (ref 3.5–5.1)
SODIUM: 143 mmol/L (ref 135–145)
Total Bilirubin: 0.5 mg/dL (ref 0.3–1.2)
Total Protein: 5.4 g/dL — ABNORMAL LOW (ref 6.5–8.1)

## 2018-09-12 MED ORDER — INFLUENZA VAC SPLIT QUAD 0.5 ML IM SUSY
PREFILLED_SYRINGE | INTRAMUSCULAR | Status: AC
  Filled 2018-09-12: qty 0.5

## 2018-09-12 MED ORDER — ACETAMINOPHEN 325 MG PO TABS
ORAL_TABLET | ORAL | Status: AC
  Filled 2018-09-12: qty 2

## 2018-09-12 MED ORDER — ACETAMINOPHEN 325 MG PO TABS
650.0000 mg | ORAL_TABLET | Freq: Once | ORAL | Status: AC
  Administered 2018-09-12: 650 mg via ORAL

## 2018-09-12 MED ORDER — SODIUM CHLORIDE 0.9 % IV SOLN
600.0000 mg/m2 | Freq: Once | INTRAVENOUS | Status: AC
  Administered 2018-09-12: 1040 mg via INTRAVENOUS
  Filled 2018-09-12: qty 52

## 2018-09-12 MED ORDER — PREDNISONE 20 MG PO TABS: ORAL_TABLET | ORAL | 0 refills | Status: DC

## 2018-09-12 MED ORDER — INFLUENZA VAC SPLIT QUAD 0.5 ML IM SUSY
0.5000 mL | PREFILLED_SYRINGE | Freq: Once | INTRAMUSCULAR | Status: AC
  Administered 2018-09-12: 0.5 mL via INTRAMUSCULAR

## 2018-09-12 MED ORDER — PALONOSETRON HCL INJECTION 0.25 MG/5ML
INTRAVENOUS | Status: AC
  Filled 2018-09-12: qty 5

## 2018-09-12 MED ORDER — DEXAMETHASONE SODIUM PHOSPHATE 10 MG/ML IJ SOLN
INTRAMUSCULAR | Status: AC
  Filled 2018-09-12: qty 1

## 2018-09-12 MED ORDER — VINCRISTINE SULFATE CHEMO INJECTION 1 MG/ML
1.0000 mg | Freq: Once | INTRAVENOUS | Status: AC
  Administered 2018-09-12: 1 mg via INTRAVENOUS
  Filled 2018-09-12: qty 1

## 2018-09-12 MED ORDER — SODIUM CHLORIDE 0.9% FLUSH
3.0000 mL | INTRAVENOUS | Status: DC | PRN
  Administered 2018-09-12: 3 mL
  Filled 2018-09-12: qty 10

## 2018-09-12 MED ORDER — DIPHENHYDRAMINE HCL 25 MG PO CAPS
25.0000 mg | ORAL_CAPSULE | Freq: Once | ORAL | Status: AC
  Administered 2018-09-12: 25 mg via ORAL

## 2018-09-12 MED ORDER — DEXAMETHASONE SODIUM PHOSPHATE 10 MG/ML IJ SOLN
10.0000 mg | Freq: Once | INTRAMUSCULAR | Status: AC
  Administered 2018-09-12: 10 mg via INTRAVENOUS

## 2018-09-12 MED ORDER — PALONOSETRON HCL INJECTION 0.25 MG/5ML
0.2500 mg | Freq: Once | INTRAVENOUS | Status: AC
  Administered 2018-09-12: 0.25 mg via INTRAVENOUS

## 2018-09-12 MED ORDER — SODIUM CHLORIDE 0.9 % IV SOLN
375.0000 mg/m2 | Freq: Once | INTRAVENOUS | Status: AC
  Administered 2018-09-12: 700 mg via INTRAVENOUS
  Filled 2018-09-12: qty 50

## 2018-09-12 MED ORDER — SODIUM CHLORIDE 0.9 % IV SOLN
Freq: Once | INTRAVENOUS | Status: AC
  Administered 2018-09-12: 11:00:00 via INTRAVENOUS
  Filled 2018-09-12: qty 250

## 2018-09-12 MED ORDER — DIPHENHYDRAMINE HCL 25 MG PO CAPS
ORAL_CAPSULE | ORAL | Status: AC
  Filled 2018-09-12: qty 1

## 2018-09-12 MED ORDER — HEPARIN SOD (PORK) LOCK FLUSH 100 UNIT/ML IV SOLN
250.0000 [IU] | Freq: Once | INTRAVENOUS | Status: AC | PRN
  Administered 2018-09-12: 250 [IU]
  Filled 2018-09-12: qty 5

## 2018-09-12 MED ORDER — SODIUM CHLORIDE 0.9% FLUSH
10.0000 mL | Freq: Once | INTRAVENOUS | Status: AC
  Administered 2018-09-12: 10 mL
  Filled 2018-09-12: qty 10

## 2018-09-12 NOTE — Progress Notes (Signed)
FMLA for son, Alveda Reasons, successfully faxed to [employer] at [phone]. Mailed copy to son at [address], [place], [place] [address].

## 2018-09-12 NOTE — Progress Notes (Signed)
FMLA for son, Alveda Reasons, taken to Ms. Wilma at front desk for son to pick up.

## 2018-09-12 NOTE — Telephone Encounter (Signed)
Appts scheduled avs/calendar printed per 9/30 los

## 2018-09-12 NOTE — Progress Notes (Signed)
[facility] OFFICE PROGRESS NOTE   Diagnosis: Non-Hodgkin's lymphoma  INTERVAL HISTORY:   Erica Escobar was discharged home 09/02/2018 after hospital admission with failure to thrive, severe anemia, and diarrhea.  She reports persistent orthostasis.  She was started on Florinef by Dr. Concha Pyo.  Erica Escobar has persistent back pain.  She is taking Tylenol.  She has no bleeding.  She has a poor appetite and nausea.  Her son acknowledges her performance status has improved.  She is completing physical therapy at home.  Objective:  Vital signs in last 24 hours:  Blood pressure 124/72, pulse 73, temperature 98 F (36.7 C), temperature source Oral, resp. rate 17, height 5' 3"  (1.6 m), weight 146 lb 4.8 oz (66.4 kg), SpO2 100 %.    HEENT: No thrush Resp: Lungs clear bilaterally Cardio: Regular rate and rhythm GI: No hepatomegaly, nontender Vascular: Trace lower leg edema bilaterally    Portacath/PICC-without erythema  Lab Results:  Lab Results  Component Value Date   WBC 6.2 09/12/2018   HGB 9.7 (L) 09/12/2018   HCT 28.9 (L) 09/12/2018   MCV 93.3 09/12/2018   PLT 289 09/12/2018   NEUTROABS 3.5 09/12/2018    CMP  Lab Results  Component Value Date   NA 143 09/12/2018   K 3.8 09/12/2018   CL 108 09/12/2018   CO2 27 09/12/2018   GLUCOSE 118 (H) 09/12/2018   BUN 11 09/12/2018   CREATININE 0.80 09/12/2018   CALCIUM 8.5 (L) 09/12/2018   PROT 5.4 (L) 09/12/2018   ALBUMIN 2.6 (L) 09/12/2018   AST 21 09/12/2018   ALT 13 09/12/2018   ALKPHOS 105 09/12/2018   BILITOT 0.5 09/12/2018   GFRNONAA >60 09/12/2018   GFRAA >60 09/12/2018     Medications: I have reviewed the patient's current medications.   Assessment/Plan: 1. 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 Loss of MLH1 and PMS2, MSI-high, BRAF mutation detected ? Colonoscopy February 2017 ? Colonoscopy 12/16/2017-3 mm polyp  in the transverse colon, 10 mm polyp in the sigmoid colon. Diverticulosis in the sigmoid colon. Transverse colon polyp, tubular adenoma; sigmoid colon polyp, tubular adenoma. ? CT 07/12/2018-probable subcutaneous metastasis at the left abdominal wall, left perinephric mass, destructive thoracic/lumbar paraspinous mass ? CT-guided biopsy of the paraspinous mass 07/13/2018  2. Anemia-likely secondary to bleeding from the colon cancer and surgery. Hemoglobin in normal range 03/20/2016, mild persistent anemia 3. Ascending colon "polyps" removed on the colonoscopy 12/22/2014 with the pathology revealing poorly differentiated adenocarcinoma and a sessile serrated adenoma polyp, similar to the pathology on the hepatic flexure mass 4. History of multiple skin cancers. Seen by dermatology every 3 months. 5. Glaucoma 6.Non-Hodgkin's lymphoma presenting with severe back pain  CTabdomen/pelvis7/30/2019-large destructive right paraspinal chest wall mass lower thoracic/upper lumbar spinewith associated bone destruction involving the right posterior elements of T9, T10 and T11, spinous processes of T8 and T9, and the posterior right 10th and 11th ribs. Probable subcutaneous metastasis anterior left abdominal wall 2.2 cm; enlarging mass in the left perinephric space 3.0 cm in size.  CT L-spine 07/12/2018-paraspinous soft tissue mass on the right at the lower thoracic spine causing destruction of the right T11 and 12 transverse processes; may encroach on the right T11 neural foramen.  CT-guided biopsy paraspinous mass 07/13/2018-poorly differentiated malignant neoplasm; CD45, CD20,bcl-6 andbcl-2positive. 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  Radiationto paraspinous massinitiated 07/14/2018  Cycle 1 CVP/Rituxan 08/02/2018  08/29/2018-decreased size of right paraspinal soft tissue mass,  PET scan 4/49/7530-[id number] mild metabolic activity within  the right paraspinal mass which is decreased significantly in size from the comparison exam. No evidence of lymphoma elsewhere. Focal activity within the distal esophagus above the GE junction without mass lesion or obstruction.  Cycle 2 CVP/Rituxan 08/22/2018,udenyca added  CT abdomen/pelvis- decrease in size of the right paraspinal soft tissue mass with adjacent lytic abnormality in the right posterior 10th and 11th ribs.  Cycle 3 CVP/Rituxan 09/12/2018-Cytoxan dose reduced 7.Anorexia/weight loss 8.Mild elevation of calcium-questionhypercalcemia of malignancy 9.History of severe neutropeniaand anemiasecondary to radiation and chemotherapy; udenyca added with cycle 2CVP/Rituxan Cytoxan dose reduced with cycle 3  10.  Admission 08/29/2018 with failure to thrive and severe anemia/neutropenia, status post red cell transfusions on 08/30/2018     Disposition: Erica Escobar has non-Hodgkin's lymphoma.  She has completed 2 cycles of CVP/rituximab complicated by early to thrive and severe anemia/neutropenia.  The etiology of the diarrhea during the recent hospital admission is unclear.  The severe anemia is likely secondary to malnutrition, lymphoma, chemotherapy, and radiation.  No gross bleeding has been documented.  Her performance status appears to be improving.  I recommend she continue home physical therapy.  She will continue Florinef.  We will repeat orthostatic vital signs today.  She will return for a nadir CBC an office visit on 09/22/2018.  We will arrange for PICC care with advanced home care.  25 minutes were spent with the patient today.  The majority of the time was used for counseling and coordination of care.  Betsy Coder, MD  09/12/2018  3:18 PM

## 2018-09-12 NOTE — Patient Instructions (Signed)
[facility] Discharge Instructions for Patients Receiving Chemotherapy  Today you received the following chemotherapy agents Vincristine, Cytoxan and Rituxan, Influenza Virus Vaccine.  To help prevent nausea and vomiting after your treatment, we encourage you to take your nausea medication as directed.    If you develop nausea and vomiting that is not controlled by your nausea medication, call the clinic.   BELOW ARE SYMPTOMS THAT SHOULD BE REPORTED IMMEDIATELY:  *FEVER GREATER THAN 100.5 F  *CHILLS WITH OR WITHOUT FEVER  NAUSEA AND VOMITING THAT IS NOT CONTROLLED WITH YOUR NAUSEA MEDICATION  *UNUSUAL SHORTNESS OF BREATH  *UNUSUAL BRUISING OR BLEEDING  TENDERNESS IN MOUTH AND THROAT WITH OR WITHOUT PRESENCE OF ULCERS  *URINARY PROBLEMS  *BOWEL PROBLEMS  UNUSUAL RASH Items with * indicate a potential emergency and should be followed up as soon as possible.  Feel free to call the clinic should you have any questions or concerns. The clinic phone number is [phone].  Please show the [facility] at check-in to the Emergency Department and triage nurse.  Influenza Virus Vaccine (Flucelvax) What is this medicine? INFLUENZA VIRUS VACCINE (in floo EN zuh VAHY ruhs vak SEEN) helps to reduce the risk of getting influenza also known as the flu. The vaccine only helps protect you against some strains of the flu. This medicine may be used for other purposes; ask your health care provider or pharmacist if you have questions. COMMON BRAND NAME(S): FLUCELVAX What should I tell my health care provider before I take this medicine? They need to know if you have any of these conditions: -bleeding disorder like hemophilia -fever or infection -Guillain-Barre syndrome or other neurological problems -immune system problems -infection with the human immunodeficiency virus (HIV) or AIDS -low blood platelet counts -multiple sclerosis -an unusual or allergic reaction to  influenza virus vaccine, other medicines, foods, dyes or preservatives -pregnant or trying to get pregnant -breast-feeding How should I use this medicine? This vaccine is for injection into a muscle. It is given by a health care professional. A copy of Vaccine Information Statements will be given before each vaccination. Read this sheet carefully each time. The sheet may change frequently. Talk to your pediatrician regarding the use of this medicine in children. Special care may be needed. Overdosage: If you think you've taken too much of this medicine contact a poison control center or emergency room at once. Overdosage: If you think you have taken too much of this medicine contact a poison control center or emergency room at once. NOTE: This medicine is only for you. Do not share this medicine with others. What if I miss a dose? This does not apply. What may interact with this medicine? -chemotherapy or radiation therapy -medicines that lower your immune system like etanercept, anakinra, infliximab, and adalimumab -medicines that treat or prevent blood clots like warfarin -phenytoin -steroid medicines like prednisone or cortisone -theophylline -vaccines This list may not describe all possible interactions. Give your health care provider a list of all the medicines, herbs, non-prescription drugs, or dietary supplements you use. Also tell them if you smoke, drink alcohol, or use illegal drugs. Some items may interact with your medicine. What should I watch for while using this medicine? Report any side effects that do not go away within 3 days to your doctor or health care professional. Call your health care provider if any unusual symptoms occur within 6 weeks of receiving this vaccine. You may still catch the flu, but the illness is not  usually as bad. You cannot get the flu from the vaccine. The vaccine will not protect against colds or other illnesses that may cause fever. The vaccine is  needed every year. What side effects may I notice from receiving this medicine? Side effects that you should report to your doctor or health care professional as soon as possible: -allergic reactions like skin rash, itching or hives, swelling of the face, lips, or tongue Side effects that usually do not require medical attention (Report these to your doctor or health care professional if they continue or are bothersome.): -fever -headache -muscle aches and pains -pain, tenderness, redness, or swelling at the injection site -tiredness This list may not describe all possible side effects. Call your doctor for medical advice about side effects. You may report side effects to FDA at 1-800-FDA-1088. Where should I keep my medicine? The vaccine will be given by a health care professional in a clinic, pharmacy, doctor's office, or other health care setting. You will not be given vaccine doses to store at home. NOTE: This sheet is a summary. It may not cover all possible information. If you have questions about this medicine, talk to your doctor, pharmacist, or health care provider.  2018 Elsevier/Gold Standard (2011-11-11 14:06:47)

## 2018-09-13 ENCOUNTER — Inpatient Hospital Stay: Payer: Medicare Other | Attending: Nurse Practitioner

## 2018-09-13 VITALS — BP 102/64 | HR 98 | Temp 98.2°F | Resp 16

## 2018-09-13 DIAGNOSIS — Z7982 Long term (current) use of aspirin: Secondary | ICD-10-CM | POA: Diagnosis not present

## 2018-09-13 DIAGNOSIS — Z85038 Personal history of other malignant neoplasm of large intestine: Secondary | ICD-10-CM | POA: Diagnosis not present

## 2018-09-13 DIAGNOSIS — R627 Adult failure to thrive: Secondary | ICD-10-CM | POA: Insufficient documentation

## 2018-09-13 DIAGNOSIS — Z79899 Other long term (current) drug therapy: Secondary | ICD-10-CM | POA: Insufficient documentation

## 2018-09-13 DIAGNOSIS — D631 Anemia in chronic kidney disease: Secondary | ICD-10-CM | POA: Diagnosis not present

## 2018-09-13 DIAGNOSIS — Z9181 History of falling: Secondary | ICD-10-CM | POA: Diagnosis not present

## 2018-09-13 DIAGNOSIS — Z7689 Persons encountering health services in other specified circumstances: Secondary | ICD-10-CM | POA: Diagnosis not present

## 2018-09-13 DIAGNOSIS — N189 Chronic kidney disease, unspecified: Secondary | ICD-10-CM | POA: Diagnosis not present

## 2018-09-13 DIAGNOSIS — E860 Dehydration: Secondary | ICD-10-CM | POA: Diagnosis not present

## 2018-09-13 DIAGNOSIS — E785 Hyperlipidemia, unspecified: Secondary | ICD-10-CM | POA: Diagnosis not present

## 2018-09-13 DIAGNOSIS — Z87891 Personal history of nicotine dependence: Secondary | ICD-10-CM | POA: Diagnosis not present

## 2018-09-13 DIAGNOSIS — Z9049 Acquired absence of other specified parts of digestive tract: Secondary | ICD-10-CM | POA: Insufficient documentation

## 2018-09-13 DIAGNOSIS — H409 Unspecified glaucoma: Secondary | ICD-10-CM | POA: Diagnosis not present

## 2018-09-13 DIAGNOSIS — K219 Gastro-esophageal reflux disease without esophagitis: Secondary | ICD-10-CM | POA: Diagnosis not present

## 2018-09-13 DIAGNOSIS — I129 Hypertensive chronic kidney disease with stage 1 through stage 4 chronic kidney disease, or unspecified chronic kidney disease: Secondary | ICD-10-CM | POA: Diagnosis not present

## 2018-09-13 DIAGNOSIS — C8580 Other specified types of non-Hodgkin lymphoma, unspecified site: Secondary | ICD-10-CM

## 2018-09-13 DIAGNOSIS — N183 Chronic kidney disease, stage 3 (moderate): Secondary | ICD-10-CM | POA: Diagnosis not present

## 2018-09-13 DIAGNOSIS — M1990 Unspecified osteoarthritis, unspecified site: Secondary | ICD-10-CM | POA: Diagnosis not present

## 2018-09-13 DIAGNOSIS — J449 Chronic obstructive pulmonary disease, unspecified: Secondary | ICD-10-CM | POA: Diagnosis not present

## 2018-09-13 DIAGNOSIS — Z90710 Acquired absence of both cervix and uterus: Secondary | ICD-10-CM | POA: Diagnosis not present

## 2018-09-13 DIAGNOSIS — K5900 Constipation, unspecified: Secondary | ICD-10-CM | POA: Diagnosis not present

## 2018-09-13 DIAGNOSIS — C8330 Diffuse large B-cell lymphoma, unspecified site: Secondary | ICD-10-CM | POA: Diagnosis not present

## 2018-09-13 DIAGNOSIS — I951 Orthostatic hypotension: Secondary | ICD-10-CM | POA: Diagnosis not present

## 2018-09-13 DIAGNOSIS — Z85828 Personal history of other malignant neoplasm of skin: Secondary | ICD-10-CM | POA: Diagnosis not present

## 2018-09-13 DIAGNOSIS — Z5112 Encounter for antineoplastic immunotherapy: Secondary | ICD-10-CM | POA: Diagnosis not present

## 2018-09-13 DIAGNOSIS — G2581 Restless legs syndrome: Secondary | ICD-10-CM | POA: Diagnosis not present

## 2018-09-13 DIAGNOSIS — C8339 Diffuse large B-cell lymphoma, extranodal and solid organ sites: Secondary | ICD-10-CM | POA: Diagnosis not present

## 2018-09-13 MED ORDER — PEGFILGRASTIM INJECTION 6 MG/0.6ML ~~LOC~~
PREFILLED_SYRINGE | SUBCUTANEOUS | Status: AC
  Filled 2018-09-13: qty 0.6

## 2018-09-13 MED ORDER — PEGFILGRASTIM INJECTION 6 MG/0.6ML ~~LOC~~
6.0000 mg | PREFILLED_SYRINGE | Freq: Once | SUBCUTANEOUS | Status: AC
  Administered 2018-09-13: 6 mg via SUBCUTANEOUS

## 2018-09-13 NOTE — Patient Instructions (Signed)
Pegfilgrastim injection What is this medicine? PEGFILGRASTIM (PEG fil gra stim) is a long-acting granulocyte colony-stimulating factor that stimulates the growth of neutrophils, a type of white blood cell important in the body's fight against infection. It is used to reduce the incidence of fever and infection in patients with certain types of cancer who are receiving chemotherapy that affects the bone marrow, and to increase survival after being exposed to high doses of radiation. This medicine may be used for other purposes; ask your health care provider or pharmacist if you have questions. COMMON BRAND NAME(S): Neulasta What should I tell my health care provider before I take this medicine? They need to know if you have any of these conditions: -kidney disease -latex allergy -ongoing radiation therapy -sickle cell disease -skin reactions to acrylic adhesives (On-Body Injector only) -an unusual or allergic reaction to pegfilgrastim, filgrastim, other medicines, foods, dyes, or preservatives -pregnant or trying to get pregnant -breast-feeding How should I use this medicine? This medicine is for injection under the skin. If you get this medicine at home, you will be taught how to prepare and give the pre-filled syringe or how to use the On-body Injector. Refer to the patient Instructions for Use for detailed instructions. Use exactly as directed. Tell your healthcare provider immediately if you suspect that the On-body Injector may not have performed as intended or if you suspect the use of the On-body Injector resulted in a missed or partial dose. It is important that you put your used needles and syringes in a special sharps container. Do not put them in a trash can. If you do not have a sharps container, call your pharmacist or healthcare provider to get one. Talk to your pediatrician regarding the use of this medicine in children. While this drug may be prescribed for selected conditions,  precautions do apply. Overdosage: If you think you have taken too much of this medicine contact a poison control center or emergency room at once. NOTE: This medicine is only for you. Do not share this medicine with others. What if I miss a dose? It is important not to miss your dose. Call your doctor or health care professional if you miss your dose. If you miss a dose due to an On-body Injector failure or leakage, a new dose should be administered as soon as possible using a single prefilled syringe for manual use. What may interact with this medicine? Interactions have not been studied. Give your health care provider a list of all the medicines, herbs, non-prescription drugs, or dietary supplements you use. Also tell them if you smoke, drink alcohol, or use illegal drugs. Some items may interact with your medicine. This list may not describe all possible interactions. Give your health care provider a list of all the medicines, herbs, non-prescription drugs, or dietary supplements you use. Also tell them if you smoke, drink alcohol, or use illegal drugs. Some items may interact with your medicine. What should I watch for while using this medicine? You may need blood work done while you are taking this medicine. If you are going to need a MRI, CT scan, or other procedure, tell your doctor that you are using this medicine (On-Body Injector only). What side effects may I notice from receiving this medicine? Side effects that you should report to your doctor or health care professional as soon as possible: -allergic reactions like skin rash, itching or hives, swelling of the face, lips, or tongue -dizziness -fever -pain, redness, or irritation at site  where injected -pinpoint red spots on the skin -red or dark-brown urine -shortness of breath or breathing problems -stomach or side pain, or pain at the shoulder -swelling -tiredness -trouble passing urine or change in the amount of urine Side  effects that usually do not require medical attention (report to your doctor or health care professional if they continue or are bothersome): -bone pain -muscle pain This list may not describe all possible side effects. Call your doctor for medical advice about side effects. You may report side effects to FDA at 1-800-FDA-1088. Where should I keep my medicine? Keep out of the reach of children. Store pre-filled syringes in a refrigerator between 2 and 8 degrees C (36 and 46 degrees F). Do not freeze. Keep in carton to protect from light. Throw away this medicine if it is left out of the refrigerator for more than 48 hours. Throw away any unused medicine after the expiration date. NOTE: This sheet is a summary. It may not cover all possible information. If you have questions about this medicine, talk to your doctor, pharmacist, or health care provider.  2018 Elsevier/Gold Standard (2016-11-26 12:58:03)

## 2018-09-14 DIAGNOSIS — M1990 Unspecified osteoarthritis, unspecified site: Secondary | ICD-10-CM | POA: Diagnosis not present

## 2018-09-14 DIAGNOSIS — N183 Chronic kidney disease, stage 3 (moderate): Secondary | ICD-10-CM | POA: Diagnosis not present

## 2018-09-14 DIAGNOSIS — Z7982 Long term (current) use of aspirin: Secondary | ICD-10-CM | POA: Diagnosis not present

## 2018-09-14 DIAGNOSIS — K219 Gastro-esophageal reflux disease without esophagitis: Secondary | ICD-10-CM | POA: Diagnosis not present

## 2018-09-14 DIAGNOSIS — J449 Chronic obstructive pulmonary disease, unspecified: Secondary | ICD-10-CM | POA: Diagnosis not present

## 2018-09-14 DIAGNOSIS — I951 Orthostatic hypotension: Secondary | ICD-10-CM | POA: Diagnosis not present

## 2018-09-14 DIAGNOSIS — E785 Hyperlipidemia, unspecified: Secondary | ICD-10-CM | POA: Diagnosis not present

## 2018-09-14 DIAGNOSIS — G2581 Restless legs syndrome: Secondary | ICD-10-CM | POA: Diagnosis not present

## 2018-09-14 DIAGNOSIS — C8330 Diffuse large B-cell lymphoma, unspecified site: Secondary | ICD-10-CM | POA: Diagnosis not present

## 2018-09-14 DIAGNOSIS — Z87891 Personal history of nicotine dependence: Secondary | ICD-10-CM | POA: Diagnosis not present

## 2018-09-14 DIAGNOSIS — E860 Dehydration: Secondary | ICD-10-CM | POA: Diagnosis not present

## 2018-09-14 DIAGNOSIS — Z9181 History of falling: Secondary | ICD-10-CM | POA: Diagnosis not present

## 2018-09-14 DIAGNOSIS — Z85038 Personal history of other malignant neoplasm of large intestine: Secondary | ICD-10-CM | POA: Diagnosis not present

## 2018-09-14 DIAGNOSIS — K5900 Constipation, unspecified: Secondary | ICD-10-CM | POA: Diagnosis not present

## 2018-09-14 DIAGNOSIS — D631 Anemia in chronic kidney disease: Secondary | ICD-10-CM | POA: Diagnosis not present

## 2018-09-14 DIAGNOSIS — I129 Hypertensive chronic kidney disease with stage 1 through stage 4 chronic kidney disease, or unspecified chronic kidney disease: Secondary | ICD-10-CM | POA: Diagnosis not present

## 2018-09-15 DIAGNOSIS — M1990 Unspecified osteoarthritis, unspecified site: Secondary | ICD-10-CM | POA: Diagnosis not present

## 2018-09-15 DIAGNOSIS — C8330 Diffuse large B-cell lymphoma, unspecified site: Secondary | ICD-10-CM | POA: Diagnosis not present

## 2018-09-15 DIAGNOSIS — E860 Dehydration: Secondary | ICD-10-CM | POA: Diagnosis not present

## 2018-09-15 DIAGNOSIS — Z85038 Personal history of other malignant neoplasm of large intestine: Secondary | ICD-10-CM | POA: Diagnosis not present

## 2018-09-15 DIAGNOSIS — G2581 Restless legs syndrome: Secondary | ICD-10-CM | POA: Diagnosis not present

## 2018-09-15 DIAGNOSIS — Z87891 Personal history of nicotine dependence: Secondary | ICD-10-CM | POA: Diagnosis not present

## 2018-09-15 DIAGNOSIS — I951 Orthostatic hypotension: Secondary | ICD-10-CM | POA: Diagnosis not present

## 2018-09-15 DIAGNOSIS — K219 Gastro-esophageal reflux disease without esophagitis: Secondary | ICD-10-CM | POA: Diagnosis not present

## 2018-09-15 DIAGNOSIS — D631 Anemia in chronic kidney disease: Secondary | ICD-10-CM | POA: Diagnosis not present

## 2018-09-15 DIAGNOSIS — J449 Chronic obstructive pulmonary disease, unspecified: Secondary | ICD-10-CM | POA: Diagnosis not present

## 2018-09-15 DIAGNOSIS — E785 Hyperlipidemia, unspecified: Secondary | ICD-10-CM | POA: Diagnosis not present

## 2018-09-15 DIAGNOSIS — Z9181 History of falling: Secondary | ICD-10-CM | POA: Diagnosis not present

## 2018-09-15 DIAGNOSIS — I129 Hypertensive chronic kidney disease with stage 1 through stage 4 chronic kidney disease, or unspecified chronic kidney disease: Secondary | ICD-10-CM | POA: Diagnosis not present

## 2018-09-15 DIAGNOSIS — Z7982 Long term (current) use of aspirin: Secondary | ICD-10-CM | POA: Diagnosis not present

## 2018-09-15 DIAGNOSIS — N183 Chronic kidney disease, stage 3 (moderate): Secondary | ICD-10-CM | POA: Diagnosis not present

## 2018-09-15 DIAGNOSIS — K5900 Constipation, unspecified: Secondary | ICD-10-CM | POA: Diagnosis not present

## 2018-09-16 DIAGNOSIS — K5900 Constipation, unspecified: Secondary | ICD-10-CM | POA: Diagnosis not present

## 2018-09-16 DIAGNOSIS — Z7982 Long term (current) use of aspirin: Secondary | ICD-10-CM | POA: Diagnosis not present

## 2018-09-16 DIAGNOSIS — M1990 Unspecified osteoarthritis, unspecified site: Secondary | ICD-10-CM | POA: Diagnosis not present

## 2018-09-16 DIAGNOSIS — K219 Gastro-esophageal reflux disease without esophagitis: Secondary | ICD-10-CM | POA: Diagnosis not present

## 2018-09-16 DIAGNOSIS — E860 Dehydration: Secondary | ICD-10-CM | POA: Diagnosis not present

## 2018-09-16 DIAGNOSIS — Z85038 Personal history of other malignant neoplasm of large intestine: Secondary | ICD-10-CM | POA: Diagnosis not present

## 2018-09-16 DIAGNOSIS — I129 Hypertensive chronic kidney disease with stage 1 through stage 4 chronic kidney disease, or unspecified chronic kidney disease: Secondary | ICD-10-CM | POA: Diagnosis not present

## 2018-09-16 DIAGNOSIS — C8330 Diffuse large B-cell lymphoma, unspecified site: Secondary | ICD-10-CM | POA: Diagnosis not present

## 2018-09-16 DIAGNOSIS — J449 Chronic obstructive pulmonary disease, unspecified: Secondary | ICD-10-CM | POA: Diagnosis not present

## 2018-09-16 DIAGNOSIS — E785 Hyperlipidemia, unspecified: Secondary | ICD-10-CM | POA: Diagnosis not present

## 2018-09-16 DIAGNOSIS — D631 Anemia in chronic kidney disease: Secondary | ICD-10-CM | POA: Diagnosis not present

## 2018-09-16 DIAGNOSIS — N183 Chronic kidney disease, stage 3 (moderate): Secondary | ICD-10-CM | POA: Diagnosis not present

## 2018-09-16 DIAGNOSIS — Z87891 Personal history of nicotine dependence: Secondary | ICD-10-CM | POA: Diagnosis not present

## 2018-09-16 DIAGNOSIS — Z9181 History of falling: Secondary | ICD-10-CM | POA: Diagnosis not present

## 2018-09-16 DIAGNOSIS — I951 Orthostatic hypotension: Secondary | ICD-10-CM | POA: Diagnosis not present

## 2018-09-16 DIAGNOSIS — G2581 Restless legs syndrome: Secondary | ICD-10-CM | POA: Diagnosis not present

## 2018-09-20 DIAGNOSIS — C8330 Diffuse large B-cell lymphoma, unspecified site: Secondary | ICD-10-CM | POA: Diagnosis not present

## 2018-09-20 DIAGNOSIS — R42 Dizziness and giddiness: Secondary | ICD-10-CM | POA: Diagnosis not present

## 2018-09-20 DIAGNOSIS — Z85038 Personal history of other malignant neoplasm of large intestine: Secondary | ICD-10-CM | POA: Diagnosis not present

## 2018-09-20 DIAGNOSIS — I951 Orthostatic hypotension: Secondary | ICD-10-CM | POA: Diagnosis not present

## 2018-09-20 DIAGNOSIS — E860 Dehydration: Secondary | ICD-10-CM | POA: Diagnosis not present

## 2018-09-20 DIAGNOSIS — M1990 Unspecified osteoarthritis, unspecified site: Secondary | ICD-10-CM | POA: Diagnosis not present

## 2018-09-20 DIAGNOSIS — N183 Chronic kidney disease, stage 3 (moderate): Secondary | ICD-10-CM | POA: Diagnosis not present

## 2018-09-20 DIAGNOSIS — E785 Hyperlipidemia, unspecified: Secondary | ICD-10-CM | POA: Diagnosis not present

## 2018-09-20 DIAGNOSIS — K5900 Constipation, unspecified: Secondary | ICD-10-CM | POA: Diagnosis not present

## 2018-09-20 DIAGNOSIS — I129 Hypertensive chronic kidney disease with stage 1 through stage 4 chronic kidney disease, or unspecified chronic kidney disease: Secondary | ICD-10-CM | POA: Diagnosis not present

## 2018-09-20 DIAGNOSIS — K219 Gastro-esophageal reflux disease without esophagitis: Secondary | ICD-10-CM | POA: Diagnosis not present

## 2018-09-20 DIAGNOSIS — Z87891 Personal history of nicotine dependence: Secondary | ICD-10-CM | POA: Diagnosis not present

## 2018-09-20 DIAGNOSIS — D631 Anemia in chronic kidney disease: Secondary | ICD-10-CM | POA: Diagnosis not present

## 2018-09-20 DIAGNOSIS — J449 Chronic obstructive pulmonary disease, unspecified: Secondary | ICD-10-CM | POA: Diagnosis not present

## 2018-09-20 DIAGNOSIS — Z9181 History of falling: Secondary | ICD-10-CM | POA: Diagnosis not present

## 2018-09-20 DIAGNOSIS — Z7982 Long term (current) use of aspirin: Secondary | ICD-10-CM | POA: Diagnosis not present

## 2018-09-20 DIAGNOSIS — G2581 Restless legs syndrome: Secondary | ICD-10-CM | POA: Diagnosis not present

## 2018-09-21 DIAGNOSIS — D631 Anemia in chronic kidney disease: Secondary | ICD-10-CM | POA: Diagnosis not present

## 2018-09-21 DIAGNOSIS — I951 Orthostatic hypotension: Secondary | ICD-10-CM | POA: Diagnosis not present

## 2018-09-21 DIAGNOSIS — N183 Chronic kidney disease, stage 3 (moderate): Secondary | ICD-10-CM | POA: Diagnosis not present

## 2018-09-21 DIAGNOSIS — J449 Chronic obstructive pulmonary disease, unspecified: Secondary | ICD-10-CM | POA: Diagnosis not present

## 2018-09-21 DIAGNOSIS — Z9181 History of falling: Secondary | ICD-10-CM | POA: Diagnosis not present

## 2018-09-21 DIAGNOSIS — Z87891 Personal history of nicotine dependence: Secondary | ICD-10-CM | POA: Diagnosis not present

## 2018-09-21 DIAGNOSIS — I129 Hypertensive chronic kidney disease with stage 1 through stage 4 chronic kidney disease, or unspecified chronic kidney disease: Secondary | ICD-10-CM | POA: Diagnosis not present

## 2018-09-21 DIAGNOSIS — M1990 Unspecified osteoarthritis, unspecified site: Secondary | ICD-10-CM | POA: Diagnosis not present

## 2018-09-21 DIAGNOSIS — Z85038 Personal history of other malignant neoplasm of large intestine: Secondary | ICD-10-CM | POA: Diagnosis not present

## 2018-09-21 DIAGNOSIS — K5900 Constipation, unspecified: Secondary | ICD-10-CM | POA: Diagnosis not present

## 2018-09-21 DIAGNOSIS — E785 Hyperlipidemia, unspecified: Secondary | ICD-10-CM | POA: Diagnosis not present

## 2018-09-21 DIAGNOSIS — G2581 Restless legs syndrome: Secondary | ICD-10-CM | POA: Diagnosis not present

## 2018-09-21 DIAGNOSIS — K219 Gastro-esophageal reflux disease without esophagitis: Secondary | ICD-10-CM | POA: Diagnosis not present

## 2018-09-21 DIAGNOSIS — C8330 Diffuse large B-cell lymphoma, unspecified site: Secondary | ICD-10-CM | POA: Diagnosis not present

## 2018-09-21 DIAGNOSIS — Z7982 Long term (current) use of aspirin: Secondary | ICD-10-CM | POA: Diagnosis not present

## 2018-09-21 DIAGNOSIS — R296 Repeated falls: Secondary | ICD-10-CM | POA: Diagnosis not present

## 2018-09-21 DIAGNOSIS — E860 Dehydration: Secondary | ICD-10-CM | POA: Diagnosis not present

## 2018-09-22 ENCOUNTER — Inpatient Hospital Stay (HOSPITAL_BASED_OUTPATIENT_CLINIC_OR_DEPARTMENT_OTHER): Payer: Medicare Other | Admitting: Oncology

## 2018-09-22 ENCOUNTER — Inpatient Hospital Stay: Payer: Medicare Other

## 2018-09-22 ENCOUNTER — Telehealth: Payer: Self-pay | Admitting: Oncology

## 2018-09-22 VITALS — BP 128/74 | HR 85 | Temp 98.6°F | Resp 18 | Ht 63.0 in | Wt 150.8 lb

## 2018-09-22 DIAGNOSIS — E860 Dehydration: Secondary | ICD-10-CM

## 2018-09-22 DIAGNOSIS — N189 Chronic kidney disease, unspecified: Secondary | ICD-10-CM

## 2018-09-22 DIAGNOSIS — Z85828 Personal history of other malignant neoplasm of skin: Secondary | ICD-10-CM | POA: Diagnosis not present

## 2018-09-22 DIAGNOSIS — H409 Unspecified glaucoma: Secondary | ICD-10-CM | POA: Diagnosis not present

## 2018-09-22 DIAGNOSIS — C8580 Other specified types of non-Hodgkin lymphoma, unspecified site: Secondary | ICD-10-CM

## 2018-09-22 DIAGNOSIS — I129 Hypertensive chronic kidney disease with stage 1 through stage 4 chronic kidney disease, or unspecified chronic kidney disease: Secondary | ICD-10-CM

## 2018-09-22 DIAGNOSIS — Z79899 Other long term (current) drug therapy: Secondary | ICD-10-CM | POA: Diagnosis not present

## 2018-09-22 DIAGNOSIS — C8330 Diffuse large B-cell lymphoma, unspecified site: Secondary | ICD-10-CM

## 2018-09-22 DIAGNOSIS — Z5112 Encounter for antineoplastic immunotherapy: Secondary | ICD-10-CM | POA: Diagnosis not present

## 2018-09-22 DIAGNOSIS — C182 Malignant neoplasm of ascending colon: Secondary | ICD-10-CM

## 2018-09-22 DIAGNOSIS — Z7689 Persons encountering health services in other specified circumstances: Secondary | ICD-10-CM | POA: Diagnosis not present

## 2018-09-22 DIAGNOSIS — Z9049 Acquired absence of other specified parts of digestive tract: Secondary | ICD-10-CM | POA: Diagnosis not present

## 2018-09-22 DIAGNOSIS — C8339 Diffuse large B-cell lymphoma, extranodal and solid organ sites: Secondary | ICD-10-CM | POA: Diagnosis not present

## 2018-09-22 DIAGNOSIS — Z87891 Personal history of nicotine dependence: Secondary | ICD-10-CM | POA: Diagnosis not present

## 2018-09-22 DIAGNOSIS — R627 Adult failure to thrive: Secondary | ICD-10-CM | POA: Diagnosis not present

## 2018-09-22 DIAGNOSIS — D631 Anemia in chronic kidney disease: Secondary | ICD-10-CM | POA: Diagnosis not present

## 2018-09-22 LAB — CBC WITH DIFFERENTIAL (CANCER CENTER ONLY)
Abs Immature Granulocytes: 0.74 10*3/uL — ABNORMAL HIGH (ref 0.00–0.07)
Basophils Absolute: 0 10*3/uL (ref 0.0–0.1)
Basophils Relative: 0 %
EOS ABS: 0.2 10*3/uL (ref 0.0–0.5)
Eosinophils Relative: 2 %
HCT: 27.5 % — ABNORMAL LOW (ref 36.0–46.0)
Hemoglobin: 8.9 g/dL — ABNORMAL LOW (ref 12.0–15.0)
IMMATURE GRANULOCYTES: 8 %
Lymphocytes Relative: 8 %
Lymphs Abs: 0.7 10*3/uL (ref 0.7–4.0)
MCH: 31.2 pg (ref 26.0–34.0)
MCHC: 32.4 g/dL (ref 30.0–36.0)
MCV: 96.5 fL (ref 80.0–100.0)
MONO ABS: 0.9 10*3/uL (ref 0.1–1.0)
Monocytes Relative: 10 %
NEUTROS ABS: 6.7 10*3/uL (ref 1.7–7.7)
Neutrophils Relative %: 72 %
PLATELETS: 152 10*3/uL (ref 150–400)
RBC: 2.85 MIL/uL — AB (ref 3.87–5.11)
RDW: 21.4 % — AB (ref 11.5–15.5)
WBC Count: 9.3 10*3/uL (ref 4.0–10.5)
nRBC: 0.5 % — ABNORMAL HIGH (ref 0.0–0.2)

## 2018-09-22 NOTE — Progress Notes (Signed)
[place] OFFICE PROGRESS NOTE   Diagnosis: Non-Hodgkin's lymphoma  INTERVAL HISTORY:   Erica Escobar completed another cycle of CVP-rituximab on 09/12/2018.  She received Neulasta on 09/13/2018.  She is ambulating with a walker.  She is participating in home physical therapy.  She continues to feel "dizzy "at times.  She has vertigo symptoms when sitting up in bed.  She saw Dr. Shelia Media earlier this week.  Florinef was added for orthostasis.  She was also given a "medication "for vertigo.  Objective:  Vital signs in last 24 hours:  Blood pressure 128/74, pulse 85, temperature 98.6 F (37 C), temperature source Oral, resp. rate 18, height 5' 3" (1.6 m), weight 150 lb 12.8 oz (68.4 kg).    HEENT: No thrush or ulcers, both external canals have cerumen Resp: Lungs clear bilaterally Cardio: Regular rate and rhythm GI: No hepatosplenomegaly, nontender Vascular: Trace pitting edema at the right greater than left lower leg and ankle   Portacath/PICC-without erythema  Lab Results:  Lab Results  Component Value Date   WBC 9.3 09/22/2018   HGB 8.9 (L) 09/22/2018   HCT 27.5 (L) 09/22/2018   MCV 96.5 09/22/2018   PLT 152 09/22/2018   NEUTROABS PENDING 09/22/2018    CMP  Lab Results  Component Value Date   NA 143 09/12/2018   K 3.8 09/12/2018   CL 108 09/12/2018   CO2 27 09/12/2018   GLUCOSE 118 (H) 09/12/2018   BUN 11 09/12/2018   CREATININE 0.80 09/12/2018   CALCIUM 8.5 (L) 09/12/2018   PROT 5.4 (L) 09/12/2018   ALBUMIN 2.6 (L) 09/12/2018   AST 21 09/12/2018   ALT 13 09/12/2018   ALKPHOS 105 09/12/2018   BILITOT 0.5 09/12/2018   GFRNONAA >60 09/12/2018   GFRAA >60 09/12/2018    Lab Results  Component Value Date   CEA1 2.2 07/12/2018    Medications: I have reviewed the patient's current medications.   Assessment/Plan: 1. 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 Loss  of MLH1 and PMS2, MSI-high, BRAF mutation detected ? Colonoscopy February 2017 ? Colonoscopy 12/16/2017-3 mm polyp in the transverse colon, 10 mm polyp in the sigmoid colon. Diverticulosis in the sigmoid colon. Transverse colon polyp, tubular adenoma; sigmoid colon polyp, tubular adenoma. ? CT 07/12/2018-probable subcutaneous metastasis at the left abdominal wall, left perinephric mass, destructive thoracic/lumbar paraspinous mass ? CT-guided biopsy of the paraspinous mass 07/13/2018  2. Anemia-likely secondary to bleeding from the colon cancer and surgery. Hemoglobin in normal range 03/20/2016, mild persistent anemia 3. Ascending colon "polyps" removed on the colonoscopy 12/22/2014 with the pathology revealing poorly differentiated adenocarcinoma and a sessile serrated adenoma polyp, similar to the pathology on the hepatic flexure mass 4. History of multiple skin cancers. Seen by dermatology every 3 months. 5. Glaucoma 6.Non-Hodgkin's lymphoma presenting with severe back pain  CTabdomen/pelvis7/30/2019-large destructive right paraspinal chest wall mass lower thoracic/upper lumbar spinewith associated bone destruction involving the right posterior elements of T9, T10 and T11, spinous processes of T8 and T9, and the posterior right 10th and 11th ribs. Probable subcutaneous metastasis anterior left abdominal wall 2.2 cm; enlarging mass in the left perinephric space 3.0 cm in size.  CT L-spine 07/12/2018-paraspinous soft tissue mass on the right at the lower thoracic spine causing destruction of the right T11 and 12 transverse processes; may encroach on the right T11 neural foramen.  CT-guided biopsy paraspinous mass 07/13/2018-poorly differentiated malignant neoplasm; CD45, CD20,bcl-6  andbcl-2positive. 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  Radiationto paraspinous massinitiated 07/14/2018  Cycle 1 CVP/Rituxan 08/02/2018  08/29/2018-decreased size of right paraspinal soft tissue mass,  PET scan 12/28/7260-[id number] mild metabolic activity within the right paraspinal mass which is decreased significantly in size from the comparison exam. No evidence of lymphoma elsewhere. Focal activity within the distal esophagus above the GE junction without mass lesion or obstruction.  Cycle 2 CVP/Rituxan 08/22/2018,udenyca added  CT abdomen/pelvis- decrease in size of the right paraspinal soft tissue mass with adjacent lytic abnormality in the right posterior 10th and 11th ribs.  Cycle 3 CVP/Rituxan 09/12/2018-Cytoxan dose reduced 7.Anorexia/weight loss 8.Mild elevation of calcium-questionhypercalcemia of malignancy 9.History of severe neutropeniaand anemiasecondary to radiation and chemotherapy; udenyca added with cycle 2CVP/Rituxan Cytoxan dose reduced with cycle 3  10. Admission 08/29/2018 with failure to thrive and severe anemia/neutropenia, status post red cell transfusions on 08/30/2018     Disposition: Erica Escobar is now at day 11 following cycle 3 CVP/rituximab.  She appears to have tolerated the treatment well.  She will continue home physical therapy.  She will return for an office visit in the next cycle of chemotherapy on 10/03/2018.  Erica Escobar appears to have positional vertigo in addition to orthostasis.  She will discuss treatment of the vertigo with the physical therapy team.  Betsy Coder, MD  09/22/2018  4:16 PM

## 2018-09-22 NOTE — Telephone Encounter (Signed)
F/u as scheduled per 10/10 los

## 2018-09-23 DIAGNOSIS — K5900 Constipation, unspecified: Secondary | ICD-10-CM | POA: Diagnosis not present

## 2018-09-23 DIAGNOSIS — C8330 Diffuse large B-cell lymphoma, unspecified site: Secondary | ICD-10-CM | POA: Diagnosis not present

## 2018-09-23 DIAGNOSIS — I129 Hypertensive chronic kidney disease with stage 1 through stage 4 chronic kidney disease, or unspecified chronic kidney disease: Secondary | ICD-10-CM | POA: Diagnosis not present

## 2018-09-23 DIAGNOSIS — K219 Gastro-esophageal reflux disease without esophagitis: Secondary | ICD-10-CM | POA: Diagnosis not present

## 2018-09-23 DIAGNOSIS — I951 Orthostatic hypotension: Secondary | ICD-10-CM | POA: Diagnosis not present

## 2018-09-23 DIAGNOSIS — D631 Anemia in chronic kidney disease: Secondary | ICD-10-CM | POA: Diagnosis not present

## 2018-09-23 DIAGNOSIS — Z7982 Long term (current) use of aspirin: Secondary | ICD-10-CM | POA: Diagnosis not present

## 2018-09-23 DIAGNOSIS — M1990 Unspecified osteoarthritis, unspecified site: Secondary | ICD-10-CM | POA: Diagnosis not present

## 2018-09-23 DIAGNOSIS — Z9181 History of falling: Secondary | ICD-10-CM | POA: Diagnosis not present

## 2018-09-23 DIAGNOSIS — N183 Chronic kidney disease, stage 3 (moderate): Secondary | ICD-10-CM | POA: Diagnosis not present

## 2018-09-23 DIAGNOSIS — E785 Hyperlipidemia, unspecified: Secondary | ICD-10-CM | POA: Diagnosis not present

## 2018-09-23 DIAGNOSIS — J449 Chronic obstructive pulmonary disease, unspecified: Secondary | ICD-10-CM | POA: Diagnosis not present

## 2018-09-23 DIAGNOSIS — Z85038 Personal history of other malignant neoplasm of large intestine: Secondary | ICD-10-CM | POA: Diagnosis not present

## 2018-09-23 DIAGNOSIS — E860 Dehydration: Secondary | ICD-10-CM | POA: Diagnosis not present

## 2018-09-23 DIAGNOSIS — G2581 Restless legs syndrome: Secondary | ICD-10-CM | POA: Diagnosis not present

## 2018-09-23 DIAGNOSIS — Z87891 Personal history of nicotine dependence: Secondary | ICD-10-CM | POA: Diagnosis not present

## 2018-09-27 DIAGNOSIS — H8113 Benign paroxysmal vertigo, bilateral: Secondary | ICD-10-CM | POA: Diagnosis not present

## 2018-09-27 DIAGNOSIS — H9313 Tinnitus, bilateral: Secondary | ICD-10-CM | POA: Diagnosis not present

## 2018-09-27 DIAGNOSIS — H903 Sensorineural hearing loss, bilateral: Secondary | ICD-10-CM | POA: Diagnosis not present

## 2018-09-27 DIAGNOSIS — H6122 Impacted cerumen, left ear: Secondary | ICD-10-CM | POA: Diagnosis not present

## 2018-09-28 DIAGNOSIS — Z87891 Personal history of nicotine dependence: Secondary | ICD-10-CM | POA: Diagnosis not present

## 2018-09-28 DIAGNOSIS — R296 Repeated falls: Secondary | ICD-10-CM | POA: Diagnosis not present

## 2018-09-28 DIAGNOSIS — E860 Dehydration: Secondary | ICD-10-CM | POA: Diagnosis not present

## 2018-09-28 DIAGNOSIS — K219 Gastro-esophageal reflux disease without esophagitis: Secondary | ICD-10-CM | POA: Diagnosis not present

## 2018-09-28 DIAGNOSIS — I129 Hypertensive chronic kidney disease with stage 1 through stage 4 chronic kidney disease, or unspecified chronic kidney disease: Secondary | ICD-10-CM | POA: Diagnosis not present

## 2018-09-28 DIAGNOSIS — Z85038 Personal history of other malignant neoplasm of large intestine: Secondary | ICD-10-CM | POA: Diagnosis not present

## 2018-09-28 DIAGNOSIS — C8330 Diffuse large B-cell lymphoma, unspecified site: Secondary | ICD-10-CM | POA: Diagnosis not present

## 2018-09-28 DIAGNOSIS — E785 Hyperlipidemia, unspecified: Secondary | ICD-10-CM | POA: Diagnosis not present

## 2018-09-28 DIAGNOSIS — K5900 Constipation, unspecified: Secondary | ICD-10-CM | POA: Diagnosis not present

## 2018-09-28 DIAGNOSIS — M1990 Unspecified osteoarthritis, unspecified site: Secondary | ICD-10-CM | POA: Diagnosis not present

## 2018-09-28 DIAGNOSIS — Z7982 Long term (current) use of aspirin: Secondary | ICD-10-CM | POA: Diagnosis not present

## 2018-09-28 DIAGNOSIS — I951 Orthostatic hypotension: Secondary | ICD-10-CM | POA: Diagnosis not present

## 2018-09-28 DIAGNOSIS — N183 Chronic kidney disease, stage 3 (moderate): Secondary | ICD-10-CM | POA: Diagnosis not present

## 2018-09-28 DIAGNOSIS — D631 Anemia in chronic kidney disease: Secondary | ICD-10-CM | POA: Diagnosis not present

## 2018-09-28 DIAGNOSIS — J449 Chronic obstructive pulmonary disease, unspecified: Secondary | ICD-10-CM | POA: Diagnosis not present

## 2018-09-28 DIAGNOSIS — G2581 Restless legs syndrome: Secondary | ICD-10-CM | POA: Diagnosis not present

## 2018-09-28 DIAGNOSIS — Z9181 History of falling: Secondary | ICD-10-CM | POA: Diagnosis not present

## 2018-09-30 DIAGNOSIS — Z85038 Personal history of other malignant neoplasm of large intestine: Secondary | ICD-10-CM | POA: Diagnosis not present

## 2018-09-30 DIAGNOSIS — D631 Anemia in chronic kidney disease: Secondary | ICD-10-CM | POA: Diagnosis not present

## 2018-09-30 DIAGNOSIS — I951 Orthostatic hypotension: Secondary | ICD-10-CM | POA: Diagnosis not present

## 2018-09-30 DIAGNOSIS — K219 Gastro-esophageal reflux disease without esophagitis: Secondary | ICD-10-CM | POA: Diagnosis not present

## 2018-09-30 DIAGNOSIS — C8330 Diffuse large B-cell lymphoma, unspecified site: Secondary | ICD-10-CM | POA: Diagnosis not present

## 2018-09-30 DIAGNOSIS — E785 Hyperlipidemia, unspecified: Secondary | ICD-10-CM | POA: Diagnosis not present

## 2018-09-30 DIAGNOSIS — N183 Chronic kidney disease, stage 3 (moderate): Secondary | ICD-10-CM | POA: Diagnosis not present

## 2018-09-30 DIAGNOSIS — Z7982 Long term (current) use of aspirin: Secondary | ICD-10-CM | POA: Diagnosis not present

## 2018-09-30 DIAGNOSIS — M1990 Unspecified osteoarthritis, unspecified site: Secondary | ICD-10-CM | POA: Diagnosis not present

## 2018-09-30 DIAGNOSIS — K5900 Constipation, unspecified: Secondary | ICD-10-CM | POA: Diagnosis not present

## 2018-09-30 DIAGNOSIS — Z87891 Personal history of nicotine dependence: Secondary | ICD-10-CM | POA: Diagnosis not present

## 2018-09-30 DIAGNOSIS — Z9181 History of falling: Secondary | ICD-10-CM | POA: Diagnosis not present

## 2018-09-30 DIAGNOSIS — J449 Chronic obstructive pulmonary disease, unspecified: Secondary | ICD-10-CM | POA: Diagnosis not present

## 2018-09-30 DIAGNOSIS — I129 Hypertensive chronic kidney disease with stage 1 through stage 4 chronic kidney disease, or unspecified chronic kidney disease: Secondary | ICD-10-CM | POA: Diagnosis not present

## 2018-09-30 DIAGNOSIS — G2581 Restless legs syndrome: Secondary | ICD-10-CM | POA: Diagnosis not present

## 2018-09-30 DIAGNOSIS — E860 Dehydration: Secondary | ICD-10-CM | POA: Diagnosis not present

## 2018-10-01 ENCOUNTER — Other Ambulatory Visit: Payer: Self-pay | Admitting: Oncology

## 2018-10-03 ENCOUNTER — Inpatient Hospital Stay (HOSPITAL_BASED_OUTPATIENT_CLINIC_OR_DEPARTMENT_OTHER): Payer: Medicare Other | Admitting: Oncology

## 2018-10-03 ENCOUNTER — Other Ambulatory Visit: Payer: Self-pay | Admitting: *Deleted

## 2018-10-03 ENCOUNTER — Inpatient Hospital Stay: Payer: Medicare Other

## 2018-10-03 ENCOUNTER — Telehealth: Payer: Self-pay | Admitting: Oncology

## 2018-10-03 VITALS — BP 130/86 | HR 79 | Temp 98.4°F | Resp 16

## 2018-10-03 VITALS — BP 135/72 | HR 102 | Temp 98.2°F | Resp 14 | Ht 63.0 in | Wt 150.4 lb

## 2018-10-03 DIAGNOSIS — H409 Unspecified glaucoma: Secondary | ICD-10-CM

## 2018-10-03 DIAGNOSIS — C8580 Other specified types of non-Hodgkin lymphoma, unspecified site: Secondary | ICD-10-CM

## 2018-10-03 DIAGNOSIS — Z85828 Personal history of other malignant neoplasm of skin: Secondary | ICD-10-CM

## 2018-10-03 DIAGNOSIS — C8339 Diffuse large B-cell lymphoma, extranodal and solid organ sites: Secondary | ICD-10-CM

## 2018-10-03 DIAGNOSIS — D631 Anemia in chronic kidney disease: Secondary | ICD-10-CM | POA: Diagnosis not present

## 2018-10-03 DIAGNOSIS — R627 Adult failure to thrive: Secondary | ICD-10-CM | POA: Diagnosis not present

## 2018-10-03 DIAGNOSIS — Z9049 Acquired absence of other specified parts of digestive tract: Secondary | ICD-10-CM | POA: Diagnosis not present

## 2018-10-03 DIAGNOSIS — Z7689 Persons encountering health services in other specified circumstances: Secondary | ICD-10-CM | POA: Diagnosis not present

## 2018-10-03 DIAGNOSIS — Z5112 Encounter for antineoplastic immunotherapy: Secondary | ICD-10-CM | POA: Diagnosis not present

## 2018-10-03 DIAGNOSIS — Z87891 Personal history of nicotine dependence: Secondary | ICD-10-CM | POA: Diagnosis not present

## 2018-10-03 DIAGNOSIS — N189 Chronic kidney disease, unspecified: Secondary | ICD-10-CM | POA: Diagnosis not present

## 2018-10-03 DIAGNOSIS — I129 Hypertensive chronic kidney disease with stage 1 through stage 4 chronic kidney disease, or unspecified chronic kidney disease: Secondary | ICD-10-CM | POA: Diagnosis not present

## 2018-10-03 DIAGNOSIS — Z79899 Other long term (current) drug therapy: Secondary | ICD-10-CM

## 2018-10-03 LAB — CBC WITH DIFFERENTIAL (CANCER CENTER ONLY)
Abs Immature Granulocytes: 0.05 10*3/uL (ref 0.00–0.07)
BASOS ABS: 0.1 10*3/uL (ref 0.0–0.1)
BASOS PCT: 1 %
EOS ABS: 0.1 10*3/uL (ref 0.0–0.5)
EOS PCT: 2 %
HCT: 27.7 % — ABNORMAL LOW (ref 36.0–46.0)
Hemoglobin: 9 g/dL — ABNORMAL LOW (ref 12.0–15.0)
Immature Granulocytes: 1 %
Lymphocytes Relative: 13 %
Lymphs Abs: 0.9 10*3/uL (ref 0.7–4.0)
MCH: 31.7 pg (ref 26.0–34.0)
MCHC: 32.5 g/dL (ref 30.0–36.0)
MCV: 97.5 fL (ref 80.0–100.0)
Monocytes Absolute: 0.8 10*3/uL (ref 0.1–1.0)
Monocytes Relative: 13 %
Neutro Abs: 4.7 10*3/uL (ref 1.7–7.7)
Neutrophils Relative %: 70 %
PLATELETS: 217 10*3/uL (ref 150–400)
RBC: 2.84 MIL/uL — AB (ref 3.87–5.11)
RDW: 21.1 % — AB (ref 11.5–15.5)
WBC Count: 6.6 10*3/uL (ref 4.0–10.5)
nRBC: 0 % (ref 0.0–0.2)

## 2018-10-03 LAB — CMP (CANCER CENTER ONLY)
ALBUMIN: 2.6 g/dL — AB (ref 3.5–5.0)
ALT: 11 U/L (ref 0–44)
AST: 20 U/L (ref 15–41)
Alkaline Phosphatase: 94 U/L (ref 38–126)
Anion gap: 11 (ref 5–15)
BUN: 10 mg/dL (ref 8–23)
CO2: 27 mmol/L (ref 22–32)
Calcium: 8.9 mg/dL (ref 8.9–10.3)
Chloride: 106 mmol/L (ref 98–111)
Creatinine: 0.8 mg/dL (ref 0.44–1.00)
GFR, Estimated: >60 mL/min (ref >60)
GLUCOSE: 119 mg/dL — AB (ref 70–99)
POTASSIUM: 3.5 mmol/L (ref 3.5–5.1)
SODIUM: 144 mmol/L (ref 135–145)
Total Bilirubin: 0.6 mg/dL (ref 0.3–1.2)
Total Protein: 5.4 g/dL — ABNORMAL LOW (ref 6.5–8.1)

## 2018-10-03 MED ORDER — DIPHENHYDRAMINE HCL 25 MG PO CAPS
25.0000 mg | ORAL_CAPSULE | Freq: Once | ORAL | Status: AC
  Administered 2018-10-03: 25 mg via ORAL

## 2018-10-03 MED ORDER — SODIUM CHLORIDE 0.9% FLUSH
10.0000 mL | INTRAVENOUS | Status: DC | PRN
  Administered 2018-10-03: 10 mL
  Filled 2018-10-03: qty 10

## 2018-10-03 MED ORDER — SODIUM CHLORIDE 0.9 % IV SOLN
375.0000 mg/m2 | Freq: Once | INTRAVENOUS | Status: AC
  Administered 2018-10-03: 700 mg via INTRAVENOUS
  Filled 2018-10-03: qty 50

## 2018-10-03 MED ORDER — DEXAMETHASONE SODIUM PHOSPHATE 10 MG/ML IJ SOLN
10.0000 mg | Freq: Once | INTRAMUSCULAR | Status: AC
  Administered 2018-10-03: 10 mg via INTRAVENOUS

## 2018-10-03 MED ORDER — PREDNISONE 20 MG PO TABS: ORAL_TABLET | ORAL | 0 refills | Status: DC

## 2018-10-03 MED ORDER — PALONOSETRON HCL INJECTION 0.25 MG/5ML
0.2500 mg | Freq: Once | INTRAVENOUS | Status: AC
  Administered 2018-10-03: 0.25 mg via INTRAVENOUS

## 2018-10-03 MED ORDER — ONDANSETRON 4 MG PO TBDP: 4.0000 mg | ORAL_TABLET | Freq: Three times a day (TID) | ORAL | 1 refills | Status: DC | PRN

## 2018-10-03 MED ORDER — SODIUM CHLORIDE 0.9% FLUSH
10.0000 mL | Freq: Once | INTRAVENOUS | Status: AC
  Administered 2018-10-03: 10 mL
  Filled 2018-10-03: qty 10

## 2018-10-03 MED ORDER — DEXAMETHASONE SODIUM PHOSPHATE 10 MG/ML IJ SOLN
INTRAMUSCULAR | Status: AC
  Filled 2018-10-03: qty 1

## 2018-10-03 MED ORDER — DIPHENHYDRAMINE HCL 25 MG PO CAPS
ORAL_CAPSULE | ORAL | Status: AC
  Filled 2018-10-03: qty 1

## 2018-10-03 MED ORDER — VINCRISTINE SULFATE CHEMO INJECTION 1 MG/ML
1.0000 mg | Freq: Once | INTRAVENOUS | Status: AC
  Administered 2018-10-03: 1 mg via INTRAVENOUS
  Filled 2018-10-03: qty 1

## 2018-10-03 MED ORDER — PALONOSETRON HCL INJECTION 0.25 MG/5ML
INTRAVENOUS | Status: AC
  Filled 2018-10-03: qty 5

## 2018-10-03 MED ORDER — ACETAMINOPHEN 325 MG PO TABS
ORAL_TABLET | ORAL | Status: AC
  Filled 2018-10-03: qty 2

## 2018-10-03 MED ORDER — HEPARIN SOD (PORK) LOCK FLUSH 100 UNIT/ML IV SOLN
250.0000 [IU] | Freq: Once | INTRAVENOUS | Status: AC | PRN
  Administered 2018-10-03: 250 [IU]
  Filled 2018-10-03: qty 5

## 2018-10-03 MED ORDER — HEPARIN SOD (PORK) LOCK FLUSH 100 UNIT/ML IV SOLN
250.0000 [IU] | Freq: Once | INTRAVENOUS | Status: AC
  Administered 2018-10-03: 250 [IU]
  Filled 2018-10-03: qty 5

## 2018-10-03 MED ORDER — SODIUM CHLORIDE 0.9 % IV SOLN
Freq: Once | INTRAVENOUS | Status: AC
  Administered 2018-10-03: 10:00:00 via INTRAVENOUS
  Filled 2018-10-03: qty 250

## 2018-10-03 MED ORDER — ACETAMINOPHEN 325 MG PO TABS
650.0000 mg | ORAL_TABLET | Freq: Once | ORAL | Status: AC
  Administered 2018-10-03: 650 mg via ORAL

## 2018-10-03 MED ORDER — SODIUM CHLORIDE 0.9 % IV SOLN
600.0000 mg/m2 | Freq: Once | INTRAVENOUS | Status: AC
  Administered 2018-10-03: 1040 mg via INTRAVENOUS
  Filled 2018-10-03: qty 52

## 2018-10-03 NOTE — Telephone Encounter (Signed)
Scheduled appt per 10/21 los- gave patient AVS and calender per los.

## 2018-10-03 NOTE — Patient Instructions (Signed)
[facility] Discharge Instructions for Patients Receiving Chemotherapy  Today you received the following chemotherapy agents Vincristine, Cytoxan and Rituxan.  To help prevent nausea and vomiting after your treatment, we encourage you to take your nausea medication as directed.    If you develop nausea and vomiting that is not controlled by your nausea medication, call the clinic.   BELOW ARE SYMPTOMS THAT SHOULD BE REPORTED IMMEDIATELY:  *FEVER GREATER THAN 100.5 F  *CHILLS WITH OR WITHOUT FEVER  NAUSEA AND VOMITING THAT IS NOT CONTROLLED WITH YOUR NAUSEA MEDICATION  *UNUSUAL SHORTNESS OF BREATH  *UNUSUAL BRUISING OR BLEEDING  TENDERNESS IN MOUTH AND THROAT WITH OR WITHOUT PRESENCE OF ULCERS  *URINARY PROBLEMS  *BOWEL PROBLEMS  UNUSUAL RASH Items with * indicate a potential emergency and should be followed up as soon as possible.  Feel free to call the clinic should you have any questions or concerns. The clinic phone number is [phone].  Please show the [facility] at check-in to the Emergency Department and triage nurse.

## 2018-10-03 NOTE — Progress Notes (Signed)
[place] OFFICE PROGRESS NOTE   Diagnosis: Non-Hodgkin's lymphoma  INTERVAL HISTORY:   Erica Escobar returns as scheduled.  She had another fall after an episode of vertigo.  She reports soreness at the buttock.  She had an episode of rectal bleeding after a bowel movement.  She continues to have back pain, but this has improved.  She has a good appetite.  She is participating in physical therapy at home.  Objective:  Vital signs in last 24 hours:  Blood pressure 135/72, pulse (!) 102, temperature 98.2 F (36.8 C), temperature source Oral, resp. rate 14, height _0  (1.6 m), weight 150 lb 6.4 oz (68.2 kg), SpO2 95 %.    HEENT: No thrush or ulcers Resp: End inspiratory rhonchi at the right base, no respiratory distress Cardio: Regular rate and rhythm GI: No hepatomegaly, soft and nontender Vascular: Leg edema  Skin: No sacral decubitus slightly raised erythematous rash over the legs  Portacath/PICC-without erythema  Lab Results:  Lab Results  Component Value Date   WBC 6.6 10/03/2018   HGB 9.0 (L) 10/03/2018   HCT 27.7 (L) 10/03/2018   MCV 97.5 10/03/2018   PLT 217 10/03/2018   NEUTROABS 4.7 10/03/2018    CMP  Lab Results  Component Value Date   NA 143 09/12/2018   K 3.8 09/12/2018   CL 108 09/12/2018   CO2 27 09/12/2018   GLUCOSE 118 (H) 09/12/2018   BUN 11 09/12/2018   CREATININE 0.80 09/12/2018   CALCIUM 8.5 (L) 09/12/2018   PROT 5.4 (L) 09/12/2018   ALBUMIN 2.6 (L) 09/12/2018   AST 21 09/12/2018   ALT 13 09/12/2018   ALKPHOS 105 09/12/2018   BILITOT 0.5 09/12/2018   GFRNONAA >60 09/12/2018   GFRAA >60 09/12/2018     Medications: I have reviewed the patient's current medications.   Assessment/Plan: 1. 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 Loss of MLH1 and PMS2, MSI-high, BRAF mutation detected ? Colonoscopy February 2017 ? Colonoscopy 12/16/2017-3  mm polyp in the transverse colon, 10 mm polyp in the sigmoid colon. Diverticulosis in the sigmoid colon. Transverse colon polyp, tubular adenoma; sigmoid colon polyp, tubular adenoma. ? CT 07/12/2018-probable subcutaneous metastasis at the left abdominal wall, left perinephric mass, destructive thoracic/lumbar paraspinous mass ? CT-guided biopsy of the paraspinous mass 07/13/2018  2. Anemia-likely secondary to bleeding from the colon cancer and surgery. Hemoglobin in normal range 03/20/2016, mild persistent anemia 3. Ascending colon "polyps" removed on the colonoscopy 12/22/2014 with the pathology revealing poorly differentiated adenocarcinoma and a sessile serrated adenoma polyp, similar to the pathology on the hepatic flexure mass 4. History of multiple skin cancers. Seen by dermatology every 3 months. 5. Glaucoma 6.Non-Hodgkin's lymphoma presenting with severe back pain  CTabdomen/pelvis7/30/2019-large destructive right paraspinal chest wall mass lower thoracic/upper lumbar spinewith associated bone destruction involving the right posterior elements of T9, T10 and T11, spinous processes of T8 and T9, and the posterior right 10th and 11th ribs. Probable subcutaneous metastasis anterior left abdominal wall 2.2 cm; enlarging mass in the left perinephric space 3.0 cm in size.  CT L-spine 07/12/2018-paraspinous soft tissue mass on the right at the lower thoracic spine causing destruction of the right T11 and 12 transverse processes; may encroach on the right T11 neural foramen.  CT-guided biopsy paraspinous mass 07/13/2018-poorly differentiated malignant neoplasm; CD45, CD20,bcl-6 andbcl-2positive. 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  Radiationto paraspinous massinitiated  07/14/2018  Cycle 1 CVP/Rituxan 08/02/2018 08/29/2018-decreased size of right paraspinal soft tissue mass,  PET scan 7/62/8315-[id number] mild metabolic  activity within the right paraspinal mass which is decreased significantly in size from the comparison exam. No evidence of lymphoma elsewhere. Focal activity within the distal esophagus above the GE junction without mass lesion or obstruction.  Cycle 2 CVP/Rituxan 08/22/2018,udenyca added  CT abdomen/pelvis-decrease in size of the right paraspinal soft tissue mass with adjacent lytic abnormality in the right posterior 10th and 11th ribs.  Cycle 3 CVP/Rituxan 09/12/2018-Cytoxan dose reduced  CVP/rituximab 10/03/2018 7.Anorexia/weight loss 8.Mild elevation of calcium-questionhypercalcemia of malignancy 9.History of severe neutropeniaand anemiasecondary to radiation and chemotherapy; udenyca added with cycle 2CVP/RituxanCytoxan dose reduced with cycle 3  10. Admission 08/29/2018 with failure to thrive and severe anemia/neutropenia, status post red cell transfusions on 08/30/2018      Disposition: Erica Escobar appears stable.  She has completed 3 cycles of CVP/rituximab.  Her clinical status is improved.  She will complete cycle 4 today.  We will plan for a restaging CT evaluation after cycle 5.  She will continue home physical therapy.  We will arrange for home PICC care.  Betsy Coder, MD  10/03/2018  8:52 AM

## 2018-10-04 ENCOUNTER — Inpatient Hospital Stay: Payer: Medicare Other

## 2018-10-04 DIAGNOSIS — D631 Anemia in chronic kidney disease: Secondary | ICD-10-CM | POA: Diagnosis not present

## 2018-10-04 DIAGNOSIS — C8339 Diffuse large B-cell lymphoma, extranodal and solid organ sites: Secondary | ICD-10-CM | POA: Diagnosis not present

## 2018-10-04 DIAGNOSIS — I129 Hypertensive chronic kidney disease with stage 1 through stage 4 chronic kidney disease, or unspecified chronic kidney disease: Secondary | ICD-10-CM | POA: Diagnosis not present

## 2018-10-04 DIAGNOSIS — Z7689 Persons encountering health services in other specified circumstances: Secondary | ICD-10-CM | POA: Diagnosis not present

## 2018-10-04 DIAGNOSIS — Z85828 Personal history of other malignant neoplasm of skin: Secondary | ICD-10-CM | POA: Diagnosis not present

## 2018-10-04 DIAGNOSIS — Z87891 Personal history of nicotine dependence: Secondary | ICD-10-CM | POA: Diagnosis not present

## 2018-10-04 DIAGNOSIS — H409 Unspecified glaucoma: Secondary | ICD-10-CM | POA: Diagnosis not present

## 2018-10-04 DIAGNOSIS — Z5112 Encounter for antineoplastic immunotherapy: Secondary | ICD-10-CM | POA: Diagnosis not present

## 2018-10-04 DIAGNOSIS — R627 Adult failure to thrive: Secondary | ICD-10-CM | POA: Diagnosis not present

## 2018-10-04 DIAGNOSIS — Z79899 Other long term (current) drug therapy: Secondary | ICD-10-CM | POA: Diagnosis not present

## 2018-10-04 DIAGNOSIS — C8580 Other specified types of non-Hodgkin lymphoma, unspecified site: Secondary | ICD-10-CM

## 2018-10-04 DIAGNOSIS — N189 Chronic kidney disease, unspecified: Secondary | ICD-10-CM | POA: Diagnosis not present

## 2018-10-04 DIAGNOSIS — Z9049 Acquired absence of other specified parts of digestive tract: Secondary | ICD-10-CM | POA: Diagnosis not present

## 2018-10-04 MED ORDER — PEGFILGRASTIM INJECTION 6 MG/0.6ML ~~LOC~~
6.0000 mg | PREFILLED_SYRINGE | Freq: Once | SUBCUTANEOUS | Status: AC
  Administered 2018-10-04: 6 mg via SUBCUTANEOUS

## 2018-10-04 MED ORDER — PEGFILGRASTIM INJECTION 6 MG/0.6ML ~~LOC~~
PREFILLED_SYRINGE | SUBCUTANEOUS | Status: AC
  Filled 2018-10-04: qty 0.6

## 2018-10-05 DIAGNOSIS — Z9181 History of falling: Secondary | ICD-10-CM | POA: Diagnosis not present

## 2018-10-05 DIAGNOSIS — C8330 Diffuse large B-cell lymphoma, unspecified site: Secondary | ICD-10-CM | POA: Diagnosis not present

## 2018-10-05 DIAGNOSIS — I129 Hypertensive chronic kidney disease with stage 1 through stage 4 chronic kidney disease, or unspecified chronic kidney disease: Secondary | ICD-10-CM | POA: Diagnosis not present

## 2018-10-05 DIAGNOSIS — G2581 Restless legs syndrome: Secondary | ICD-10-CM | POA: Diagnosis not present

## 2018-10-05 DIAGNOSIS — Z85038 Personal history of other malignant neoplasm of large intestine: Secondary | ICD-10-CM | POA: Diagnosis not present

## 2018-10-05 DIAGNOSIS — Z87891 Personal history of nicotine dependence: Secondary | ICD-10-CM | POA: Diagnosis not present

## 2018-10-05 DIAGNOSIS — R296 Repeated falls: Secondary | ICD-10-CM | POA: Diagnosis not present

## 2018-10-05 DIAGNOSIS — N183 Chronic kidney disease, stage 3 (moderate): Secondary | ICD-10-CM | POA: Diagnosis not present

## 2018-10-05 DIAGNOSIS — E785 Hyperlipidemia, unspecified: Secondary | ICD-10-CM | POA: Diagnosis not present

## 2018-10-05 DIAGNOSIS — Z7982 Long term (current) use of aspirin: Secondary | ICD-10-CM | POA: Diagnosis not present

## 2018-10-05 DIAGNOSIS — I951 Orthostatic hypotension: Secondary | ICD-10-CM | POA: Diagnosis not present

## 2018-10-05 DIAGNOSIS — J449 Chronic obstructive pulmonary disease, unspecified: Secondary | ICD-10-CM | POA: Diagnosis not present

## 2018-10-05 DIAGNOSIS — D631 Anemia in chronic kidney disease: Secondary | ICD-10-CM | POA: Diagnosis not present

## 2018-10-05 DIAGNOSIS — K219 Gastro-esophageal reflux disease without esophagitis: Secondary | ICD-10-CM | POA: Diagnosis not present

## 2018-10-05 DIAGNOSIS — E860 Dehydration: Secondary | ICD-10-CM | POA: Diagnosis not present

## 2018-10-05 DIAGNOSIS — K5900 Constipation, unspecified: Secondary | ICD-10-CM | POA: Diagnosis not present

## 2018-10-05 DIAGNOSIS — M1990 Unspecified osteoarthritis, unspecified site: Secondary | ICD-10-CM | POA: Diagnosis not present

## 2018-10-07 DIAGNOSIS — C8330 Diffuse large B-cell lymphoma, unspecified site: Secondary | ICD-10-CM | POA: Diagnosis not present

## 2018-10-07 DIAGNOSIS — M1990 Unspecified osteoarthritis, unspecified site: Secondary | ICD-10-CM | POA: Diagnosis not present

## 2018-10-07 DIAGNOSIS — I129 Hypertensive chronic kidney disease with stage 1 through stage 4 chronic kidney disease, or unspecified chronic kidney disease: Secondary | ICD-10-CM | POA: Diagnosis not present

## 2018-10-07 DIAGNOSIS — E785 Hyperlipidemia, unspecified: Secondary | ICD-10-CM | POA: Diagnosis not present

## 2018-10-07 DIAGNOSIS — Z7982 Long term (current) use of aspirin: Secondary | ICD-10-CM | POA: Diagnosis not present

## 2018-10-07 DIAGNOSIS — Z85038 Personal history of other malignant neoplasm of large intestine: Secondary | ICD-10-CM | POA: Diagnosis not present

## 2018-10-07 DIAGNOSIS — D631 Anemia in chronic kidney disease: Secondary | ICD-10-CM | POA: Diagnosis not present

## 2018-10-07 DIAGNOSIS — K5900 Constipation, unspecified: Secondary | ICD-10-CM | POA: Diagnosis not present

## 2018-10-07 DIAGNOSIS — Z9181 History of falling: Secondary | ICD-10-CM | POA: Diagnosis not present

## 2018-10-07 DIAGNOSIS — G2581 Restless legs syndrome: Secondary | ICD-10-CM | POA: Diagnosis not present

## 2018-10-07 DIAGNOSIS — N183 Chronic kidney disease, stage 3 (moderate): Secondary | ICD-10-CM | POA: Diagnosis not present

## 2018-10-07 DIAGNOSIS — J449 Chronic obstructive pulmonary disease, unspecified: Secondary | ICD-10-CM | POA: Diagnosis not present

## 2018-10-07 DIAGNOSIS — Z87891 Personal history of nicotine dependence: Secondary | ICD-10-CM | POA: Diagnosis not present

## 2018-10-07 DIAGNOSIS — K219 Gastro-esophageal reflux disease without esophagitis: Secondary | ICD-10-CM | POA: Diagnosis not present

## 2018-10-07 DIAGNOSIS — I951 Orthostatic hypotension: Secondary | ICD-10-CM | POA: Diagnosis not present

## 2018-10-07 DIAGNOSIS — E860 Dehydration: Secondary | ICD-10-CM | POA: Diagnosis not present

## 2018-10-10 DIAGNOSIS — G2581 Restless legs syndrome: Secondary | ICD-10-CM | POA: Diagnosis not present

## 2018-10-10 DIAGNOSIS — J449 Chronic obstructive pulmonary disease, unspecified: Secondary | ICD-10-CM | POA: Diagnosis not present

## 2018-10-10 DIAGNOSIS — M1990 Unspecified osteoarthritis, unspecified site: Secondary | ICD-10-CM | POA: Diagnosis not present

## 2018-10-10 DIAGNOSIS — Z9181 History of falling: Secondary | ICD-10-CM | POA: Diagnosis not present

## 2018-10-10 DIAGNOSIS — E785 Hyperlipidemia, unspecified: Secondary | ICD-10-CM | POA: Diagnosis not present

## 2018-10-10 DIAGNOSIS — D631 Anemia in chronic kidney disease: Secondary | ICD-10-CM | POA: Diagnosis not present

## 2018-10-10 DIAGNOSIS — I129 Hypertensive chronic kidney disease with stage 1 through stage 4 chronic kidney disease, or unspecified chronic kidney disease: Secondary | ICD-10-CM | POA: Diagnosis not present

## 2018-10-10 DIAGNOSIS — K219 Gastro-esophageal reflux disease without esophagitis: Secondary | ICD-10-CM | POA: Diagnosis not present

## 2018-10-10 DIAGNOSIS — Z85038 Personal history of other malignant neoplasm of large intestine: Secondary | ICD-10-CM | POA: Diagnosis not present

## 2018-10-10 DIAGNOSIS — C8330 Diffuse large B-cell lymphoma, unspecified site: Secondary | ICD-10-CM | POA: Diagnosis not present

## 2018-10-10 DIAGNOSIS — N183 Chronic kidney disease, stage 3 (moderate): Secondary | ICD-10-CM | POA: Diagnosis not present

## 2018-10-10 DIAGNOSIS — Z7982 Long term (current) use of aspirin: Secondary | ICD-10-CM | POA: Diagnosis not present

## 2018-10-10 DIAGNOSIS — Z87891 Personal history of nicotine dependence: Secondary | ICD-10-CM | POA: Diagnosis not present

## 2018-10-10 DIAGNOSIS — K5900 Constipation, unspecified: Secondary | ICD-10-CM | POA: Diagnosis not present

## 2018-10-10 DIAGNOSIS — E860 Dehydration: Secondary | ICD-10-CM | POA: Diagnosis not present

## 2018-10-10 DIAGNOSIS — I951 Orthostatic hypotension: Secondary | ICD-10-CM | POA: Diagnosis not present

## 2018-10-11 DIAGNOSIS — I951 Orthostatic hypotension: Secondary | ICD-10-CM | POA: Diagnosis not present

## 2018-10-11 DIAGNOSIS — Z9181 History of falling: Secondary | ICD-10-CM | POA: Diagnosis not present

## 2018-10-11 DIAGNOSIS — R42 Dizziness and giddiness: Secondary | ICD-10-CM | POA: Diagnosis not present

## 2018-10-13 DIAGNOSIS — I951 Orthostatic hypotension: Secondary | ICD-10-CM | POA: Diagnosis not present

## 2018-10-13 DIAGNOSIS — Z7982 Long term (current) use of aspirin: Secondary | ICD-10-CM | POA: Diagnosis not present

## 2018-10-13 DIAGNOSIS — E785 Hyperlipidemia, unspecified: Secondary | ICD-10-CM | POA: Diagnosis not present

## 2018-10-13 DIAGNOSIS — K5900 Constipation, unspecified: Secondary | ICD-10-CM | POA: Diagnosis not present

## 2018-10-13 DIAGNOSIS — Z87891 Personal history of nicotine dependence: Secondary | ICD-10-CM | POA: Diagnosis not present

## 2018-10-13 DIAGNOSIS — J449 Chronic obstructive pulmonary disease, unspecified: Secondary | ICD-10-CM | POA: Diagnosis not present

## 2018-10-13 DIAGNOSIS — D631 Anemia in chronic kidney disease: Secondary | ICD-10-CM | POA: Diagnosis not present

## 2018-10-13 DIAGNOSIS — R296 Repeated falls: Secondary | ICD-10-CM | POA: Diagnosis not present

## 2018-10-13 DIAGNOSIS — E860 Dehydration: Secondary | ICD-10-CM | POA: Diagnosis not present

## 2018-10-13 DIAGNOSIS — M1990 Unspecified osteoarthritis, unspecified site: Secondary | ICD-10-CM | POA: Diagnosis not present

## 2018-10-13 DIAGNOSIS — Z9181 History of falling: Secondary | ICD-10-CM | POA: Diagnosis not present

## 2018-10-13 DIAGNOSIS — K219 Gastro-esophageal reflux disease without esophagitis: Secondary | ICD-10-CM | POA: Diagnosis not present

## 2018-10-13 DIAGNOSIS — Z85038 Personal history of other malignant neoplasm of large intestine: Secondary | ICD-10-CM | POA: Diagnosis not present

## 2018-10-13 DIAGNOSIS — N183 Chronic kidney disease, stage 3 (moderate): Secondary | ICD-10-CM | POA: Diagnosis not present

## 2018-10-13 DIAGNOSIS — C8330 Diffuse large B-cell lymphoma, unspecified site: Secondary | ICD-10-CM | POA: Diagnosis not present

## 2018-10-13 DIAGNOSIS — G2581 Restless legs syndrome: Secondary | ICD-10-CM | POA: Diagnosis not present

## 2018-10-13 DIAGNOSIS — I129 Hypertensive chronic kidney disease with stage 1 through stage 4 chronic kidney disease, or unspecified chronic kidney disease: Secondary | ICD-10-CM | POA: Diagnosis not present

## 2018-10-14 DIAGNOSIS — M1990 Unspecified osteoarthritis, unspecified site: Secondary | ICD-10-CM | POA: Diagnosis not present

## 2018-10-14 DIAGNOSIS — C8330 Diffuse large B-cell lymphoma, unspecified site: Secondary | ICD-10-CM | POA: Diagnosis not present

## 2018-10-14 DIAGNOSIS — Z85038 Personal history of other malignant neoplasm of large intestine: Secondary | ICD-10-CM | POA: Diagnosis not present

## 2018-10-14 DIAGNOSIS — D631 Anemia in chronic kidney disease: Secondary | ICD-10-CM | POA: Diagnosis not present

## 2018-10-14 DIAGNOSIS — G2581 Restless legs syndrome: Secondary | ICD-10-CM | POA: Diagnosis not present

## 2018-10-14 DIAGNOSIS — R296 Repeated falls: Secondary | ICD-10-CM | POA: Diagnosis not present

## 2018-10-14 DIAGNOSIS — Z7982 Long term (current) use of aspirin: Secondary | ICD-10-CM | POA: Diagnosis not present

## 2018-10-14 DIAGNOSIS — I129 Hypertensive chronic kidney disease with stage 1 through stage 4 chronic kidney disease, or unspecified chronic kidney disease: Secondary | ICD-10-CM | POA: Diagnosis not present

## 2018-10-14 DIAGNOSIS — J449 Chronic obstructive pulmonary disease, unspecified: Secondary | ICD-10-CM | POA: Diagnosis not present

## 2018-10-14 DIAGNOSIS — N183 Chronic kidney disease, stage 3 (moderate): Secondary | ICD-10-CM | POA: Diagnosis not present

## 2018-10-14 DIAGNOSIS — K5900 Constipation, unspecified: Secondary | ICD-10-CM | POA: Diagnosis not present

## 2018-10-14 DIAGNOSIS — E785 Hyperlipidemia, unspecified: Secondary | ICD-10-CM | POA: Diagnosis not present

## 2018-10-14 DIAGNOSIS — Z87891 Personal history of nicotine dependence: Secondary | ICD-10-CM | POA: Diagnosis not present

## 2018-10-14 DIAGNOSIS — I951 Orthostatic hypotension: Secondary | ICD-10-CM | POA: Diagnosis not present

## 2018-10-14 DIAGNOSIS — K219 Gastro-esophageal reflux disease without esophagitis: Secondary | ICD-10-CM | POA: Diagnosis not present

## 2018-10-14 DIAGNOSIS — Z9181 History of falling: Secondary | ICD-10-CM | POA: Diagnosis not present

## 2018-10-14 DIAGNOSIS — E860 Dehydration: Secondary | ICD-10-CM | POA: Diagnosis not present

## 2018-10-17 DIAGNOSIS — Z7982 Long term (current) use of aspirin: Secondary | ICD-10-CM | POA: Diagnosis not present

## 2018-10-17 DIAGNOSIS — C8330 Diffuse large B-cell lymphoma, unspecified site: Secondary | ICD-10-CM | POA: Diagnosis not present

## 2018-10-17 DIAGNOSIS — M1990 Unspecified osteoarthritis, unspecified site: Secondary | ICD-10-CM | POA: Diagnosis not present

## 2018-10-17 DIAGNOSIS — K5900 Constipation, unspecified: Secondary | ICD-10-CM | POA: Diagnosis not present

## 2018-10-17 DIAGNOSIS — Z9181 History of falling: Secondary | ICD-10-CM | POA: Diagnosis not present

## 2018-10-17 DIAGNOSIS — I951 Orthostatic hypotension: Secondary | ICD-10-CM | POA: Diagnosis not present

## 2018-10-17 DIAGNOSIS — G2581 Restless legs syndrome: Secondary | ICD-10-CM | POA: Diagnosis not present

## 2018-10-17 DIAGNOSIS — J449 Chronic obstructive pulmonary disease, unspecified: Secondary | ICD-10-CM | POA: Diagnosis not present

## 2018-10-17 DIAGNOSIS — I129 Hypertensive chronic kidney disease with stage 1 through stage 4 chronic kidney disease, or unspecified chronic kidney disease: Secondary | ICD-10-CM | POA: Diagnosis not present

## 2018-10-17 DIAGNOSIS — N183 Chronic kidney disease, stage 3 (moderate): Secondary | ICD-10-CM | POA: Diagnosis not present

## 2018-10-17 DIAGNOSIS — E860 Dehydration: Secondary | ICD-10-CM | POA: Diagnosis not present

## 2018-10-17 DIAGNOSIS — E785 Hyperlipidemia, unspecified: Secondary | ICD-10-CM | POA: Diagnosis not present

## 2018-10-17 DIAGNOSIS — Z87891 Personal history of nicotine dependence: Secondary | ICD-10-CM | POA: Diagnosis not present

## 2018-10-17 DIAGNOSIS — D631 Anemia in chronic kidney disease: Secondary | ICD-10-CM | POA: Diagnosis not present

## 2018-10-17 DIAGNOSIS — Z85038 Personal history of other malignant neoplasm of large intestine: Secondary | ICD-10-CM | POA: Diagnosis not present

## 2018-10-17 DIAGNOSIS — K219 Gastro-esophageal reflux disease without esophagitis: Secondary | ICD-10-CM | POA: Diagnosis not present

## 2018-10-18 DIAGNOSIS — Z85038 Personal history of other malignant neoplasm of large intestine: Secondary | ICD-10-CM | POA: Diagnosis not present

## 2018-10-18 DIAGNOSIS — Z87891 Personal history of nicotine dependence: Secondary | ICD-10-CM | POA: Diagnosis not present

## 2018-10-18 DIAGNOSIS — Z7982 Long term (current) use of aspirin: Secondary | ICD-10-CM | POA: Diagnosis not present

## 2018-10-18 DIAGNOSIS — M1990 Unspecified osteoarthritis, unspecified site: Secondary | ICD-10-CM | POA: Diagnosis not present

## 2018-10-18 DIAGNOSIS — J449 Chronic obstructive pulmonary disease, unspecified: Secondary | ICD-10-CM | POA: Diagnosis not present

## 2018-10-18 DIAGNOSIS — E785 Hyperlipidemia, unspecified: Secondary | ICD-10-CM | POA: Diagnosis not present

## 2018-10-18 DIAGNOSIS — R296 Repeated falls: Secondary | ICD-10-CM | POA: Diagnosis not present

## 2018-10-18 DIAGNOSIS — D631 Anemia in chronic kidney disease: Secondary | ICD-10-CM | POA: Diagnosis not present

## 2018-10-18 DIAGNOSIS — N183 Chronic kidney disease, stage 3 (moderate): Secondary | ICD-10-CM | POA: Diagnosis not present

## 2018-10-18 DIAGNOSIS — C8330 Diffuse large B-cell lymphoma, unspecified site: Secondary | ICD-10-CM | POA: Diagnosis not present

## 2018-10-18 DIAGNOSIS — Z9181 History of falling: Secondary | ICD-10-CM | POA: Diagnosis not present

## 2018-10-18 DIAGNOSIS — K219 Gastro-esophageal reflux disease without esophagitis: Secondary | ICD-10-CM | POA: Diagnosis not present

## 2018-10-18 DIAGNOSIS — K5900 Constipation, unspecified: Secondary | ICD-10-CM | POA: Diagnosis not present

## 2018-10-18 DIAGNOSIS — I129 Hypertensive chronic kidney disease with stage 1 through stage 4 chronic kidney disease, or unspecified chronic kidney disease: Secondary | ICD-10-CM | POA: Diagnosis not present

## 2018-10-18 DIAGNOSIS — I951 Orthostatic hypotension: Secondary | ICD-10-CM | POA: Diagnosis not present

## 2018-10-18 DIAGNOSIS — E860 Dehydration: Secondary | ICD-10-CM | POA: Diagnosis not present

## 2018-10-18 DIAGNOSIS — G2581 Restless legs syndrome: Secondary | ICD-10-CM | POA: Diagnosis not present

## 2018-10-19 DIAGNOSIS — Z85038 Personal history of other malignant neoplasm of large intestine: Secondary | ICD-10-CM | POA: Diagnosis not present

## 2018-10-19 DIAGNOSIS — I129 Hypertensive chronic kidney disease with stage 1 through stage 4 chronic kidney disease, or unspecified chronic kidney disease: Secondary | ICD-10-CM | POA: Diagnosis not present

## 2018-10-19 DIAGNOSIS — M1990 Unspecified osteoarthritis, unspecified site: Secondary | ICD-10-CM | POA: Diagnosis not present

## 2018-10-19 DIAGNOSIS — C8330 Diffuse large B-cell lymphoma, unspecified site: Secondary | ICD-10-CM | POA: Diagnosis not present

## 2018-10-19 DIAGNOSIS — Z9181 History of falling: Secondary | ICD-10-CM | POA: Diagnosis not present

## 2018-10-19 DIAGNOSIS — K219 Gastro-esophageal reflux disease without esophagitis: Secondary | ICD-10-CM | POA: Diagnosis not present

## 2018-10-19 DIAGNOSIS — J449 Chronic obstructive pulmonary disease, unspecified: Secondary | ICD-10-CM | POA: Diagnosis not present

## 2018-10-19 DIAGNOSIS — E785 Hyperlipidemia, unspecified: Secondary | ICD-10-CM | POA: Diagnosis not present

## 2018-10-19 DIAGNOSIS — N183 Chronic kidney disease, stage 3 (moderate): Secondary | ICD-10-CM | POA: Diagnosis not present

## 2018-10-19 DIAGNOSIS — K5900 Constipation, unspecified: Secondary | ICD-10-CM | POA: Diagnosis not present

## 2018-10-19 DIAGNOSIS — E860 Dehydration: Secondary | ICD-10-CM | POA: Diagnosis not present

## 2018-10-19 DIAGNOSIS — G2581 Restless legs syndrome: Secondary | ICD-10-CM | POA: Diagnosis not present

## 2018-10-19 DIAGNOSIS — D631 Anemia in chronic kidney disease: Secondary | ICD-10-CM | POA: Diagnosis not present

## 2018-10-19 DIAGNOSIS — Z7982 Long term (current) use of aspirin: Secondary | ICD-10-CM | POA: Diagnosis not present

## 2018-10-19 DIAGNOSIS — I951 Orthostatic hypotension: Secondary | ICD-10-CM | POA: Diagnosis not present

## 2018-10-19 DIAGNOSIS — Z87891 Personal history of nicotine dependence: Secondary | ICD-10-CM | POA: Diagnosis not present

## 2018-10-19 DIAGNOSIS — R296 Repeated falls: Secondary | ICD-10-CM | POA: Diagnosis not present

## 2018-10-21 DIAGNOSIS — E785 Hyperlipidemia, unspecified: Secondary | ICD-10-CM | POA: Diagnosis not present

## 2018-10-21 DIAGNOSIS — K219 Gastro-esophageal reflux disease without esophagitis: Secondary | ICD-10-CM | POA: Diagnosis not present

## 2018-10-21 DIAGNOSIS — I129 Hypertensive chronic kidney disease with stage 1 through stage 4 chronic kidney disease, or unspecified chronic kidney disease: Secondary | ICD-10-CM | POA: Diagnosis not present

## 2018-10-21 DIAGNOSIS — E860 Dehydration: Secondary | ICD-10-CM | POA: Diagnosis not present

## 2018-10-21 DIAGNOSIS — I951 Orthostatic hypotension: Secondary | ICD-10-CM | POA: Diagnosis not present

## 2018-10-21 DIAGNOSIS — G2581 Restless legs syndrome: Secondary | ICD-10-CM | POA: Diagnosis not present

## 2018-10-21 DIAGNOSIS — Z9181 History of falling: Secondary | ICD-10-CM | POA: Diagnosis not present

## 2018-10-21 DIAGNOSIS — J449 Chronic obstructive pulmonary disease, unspecified: Secondary | ICD-10-CM | POA: Diagnosis not present

## 2018-10-21 DIAGNOSIS — D631 Anemia in chronic kidney disease: Secondary | ICD-10-CM | POA: Diagnosis not present

## 2018-10-21 DIAGNOSIS — N183 Chronic kidney disease, stage 3 (moderate): Secondary | ICD-10-CM | POA: Diagnosis not present

## 2018-10-21 DIAGNOSIS — K5900 Constipation, unspecified: Secondary | ICD-10-CM | POA: Diagnosis not present

## 2018-10-21 DIAGNOSIS — Z87891 Personal history of nicotine dependence: Secondary | ICD-10-CM | POA: Diagnosis not present

## 2018-10-21 DIAGNOSIS — Z85038 Personal history of other malignant neoplasm of large intestine: Secondary | ICD-10-CM | POA: Diagnosis not present

## 2018-10-21 DIAGNOSIS — R296 Repeated falls: Secondary | ICD-10-CM | POA: Diagnosis not present

## 2018-10-21 DIAGNOSIS — M1990 Unspecified osteoarthritis, unspecified site: Secondary | ICD-10-CM | POA: Diagnosis not present

## 2018-10-21 DIAGNOSIS — C8330 Diffuse large B-cell lymphoma, unspecified site: Secondary | ICD-10-CM | POA: Diagnosis not present

## 2018-10-21 DIAGNOSIS — Z7982 Long term (current) use of aspirin: Secondary | ICD-10-CM | POA: Diagnosis not present

## 2018-10-24 DIAGNOSIS — E785 Hyperlipidemia, unspecified: Secondary | ICD-10-CM | POA: Diagnosis not present

## 2018-10-24 DIAGNOSIS — I10 Essential (primary) hypertension: Secondary | ICD-10-CM | POA: Diagnosis not present

## 2018-10-24 DIAGNOSIS — D509 Iron deficiency anemia, unspecified: Secondary | ICD-10-CM | POA: Diagnosis not present

## 2018-10-26 DIAGNOSIS — Z7982 Long term (current) use of aspirin: Secondary | ICD-10-CM | POA: Diagnosis not present

## 2018-10-26 DIAGNOSIS — E860 Dehydration: Secondary | ICD-10-CM | POA: Diagnosis not present

## 2018-10-26 DIAGNOSIS — R296 Repeated falls: Secondary | ICD-10-CM | POA: Diagnosis not present

## 2018-10-26 DIAGNOSIS — G2581 Restless legs syndrome: Secondary | ICD-10-CM | POA: Diagnosis not present

## 2018-10-26 DIAGNOSIS — D631 Anemia in chronic kidney disease: Secondary | ICD-10-CM | POA: Diagnosis not present

## 2018-10-26 DIAGNOSIS — J449 Chronic obstructive pulmonary disease, unspecified: Secondary | ICD-10-CM | POA: Diagnosis not present

## 2018-10-26 DIAGNOSIS — N183 Chronic kidney disease, stage 3 (moderate): Secondary | ICD-10-CM | POA: Diagnosis not present

## 2018-10-26 DIAGNOSIS — M1990 Unspecified osteoarthritis, unspecified site: Secondary | ICD-10-CM | POA: Diagnosis not present

## 2018-10-26 DIAGNOSIS — K219 Gastro-esophageal reflux disease without esophagitis: Secondary | ICD-10-CM | POA: Diagnosis not present

## 2018-10-26 DIAGNOSIS — I129 Hypertensive chronic kidney disease with stage 1 through stage 4 chronic kidney disease, or unspecified chronic kidney disease: Secondary | ICD-10-CM | POA: Diagnosis not present

## 2018-10-26 DIAGNOSIS — I951 Orthostatic hypotension: Secondary | ICD-10-CM | POA: Diagnosis not present

## 2018-10-26 DIAGNOSIS — C8330 Diffuse large B-cell lymphoma, unspecified site: Secondary | ICD-10-CM | POA: Diagnosis not present

## 2018-10-26 DIAGNOSIS — Z87891 Personal history of nicotine dependence: Secondary | ICD-10-CM | POA: Diagnosis not present

## 2018-10-26 DIAGNOSIS — Z85038 Personal history of other malignant neoplasm of large intestine: Secondary | ICD-10-CM | POA: Diagnosis not present

## 2018-10-26 DIAGNOSIS — E785 Hyperlipidemia, unspecified: Secondary | ICD-10-CM | POA: Diagnosis not present

## 2018-10-26 DIAGNOSIS — Z9181 History of falling: Secondary | ICD-10-CM | POA: Diagnosis not present

## 2018-10-26 DIAGNOSIS — K5900 Constipation, unspecified: Secondary | ICD-10-CM | POA: Diagnosis not present

## 2018-10-27 DIAGNOSIS — C8330 Diffuse large B-cell lymphoma, unspecified site: Secondary | ICD-10-CM | POA: Diagnosis not present

## 2018-10-27 DIAGNOSIS — E785 Hyperlipidemia, unspecified: Secondary | ICD-10-CM | POA: Diagnosis not present

## 2018-10-28 DIAGNOSIS — K219 Gastro-esophageal reflux disease without esophagitis: Secondary | ICD-10-CM | POA: Diagnosis not present

## 2018-10-28 DIAGNOSIS — M1990 Unspecified osteoarthritis, unspecified site: Secondary | ICD-10-CM | POA: Diagnosis not present

## 2018-10-28 DIAGNOSIS — I129 Hypertensive chronic kidney disease with stage 1 through stage 4 chronic kidney disease, or unspecified chronic kidney disease: Secondary | ICD-10-CM | POA: Diagnosis not present

## 2018-10-28 DIAGNOSIS — E785 Hyperlipidemia, unspecified: Secondary | ICD-10-CM | POA: Diagnosis not present

## 2018-10-28 DIAGNOSIS — Z9181 History of falling: Secondary | ICD-10-CM | POA: Diagnosis not present

## 2018-10-28 DIAGNOSIS — D631 Anemia in chronic kidney disease: Secondary | ICD-10-CM | POA: Diagnosis not present

## 2018-10-28 DIAGNOSIS — J449 Chronic obstructive pulmonary disease, unspecified: Secondary | ICD-10-CM | POA: Diagnosis not present

## 2018-10-28 DIAGNOSIS — G2581 Restless legs syndrome: Secondary | ICD-10-CM | POA: Diagnosis not present

## 2018-10-28 DIAGNOSIS — R296 Repeated falls: Secondary | ICD-10-CM | POA: Diagnosis not present

## 2018-10-28 DIAGNOSIS — N183 Chronic kidney disease, stage 3 (moderate): Secondary | ICD-10-CM | POA: Diagnosis not present

## 2018-10-28 DIAGNOSIS — Z85038 Personal history of other malignant neoplasm of large intestine: Secondary | ICD-10-CM | POA: Diagnosis not present

## 2018-10-28 DIAGNOSIS — E860 Dehydration: Secondary | ICD-10-CM | POA: Diagnosis not present

## 2018-10-28 DIAGNOSIS — Z7982 Long term (current) use of aspirin: Secondary | ICD-10-CM | POA: Diagnosis not present

## 2018-10-28 DIAGNOSIS — I951 Orthostatic hypotension: Secondary | ICD-10-CM | POA: Diagnosis not present

## 2018-10-28 DIAGNOSIS — C8330 Diffuse large B-cell lymphoma, unspecified site: Secondary | ICD-10-CM | POA: Diagnosis not present

## 2018-10-28 DIAGNOSIS — Z87891 Personal history of nicotine dependence: Secondary | ICD-10-CM | POA: Diagnosis not present

## 2018-10-28 DIAGNOSIS — K5900 Constipation, unspecified: Secondary | ICD-10-CM | POA: Diagnosis not present

## 2018-10-30 ENCOUNTER — Other Ambulatory Visit: Payer: Self-pay | Admitting: Oncology

## 2018-10-31 ENCOUNTER — Other Ambulatory Visit: Payer: Self-pay | Admitting: *Deleted

## 2018-10-31 DIAGNOSIS — L570 Actinic keratosis: Secondary | ICD-10-CM | POA: Diagnosis not present

## 2018-10-31 DIAGNOSIS — C44629 Squamous cell carcinoma of skin of left upper limb, including shoulder: Secondary | ICD-10-CM | POA: Diagnosis not present

## 2018-10-31 DIAGNOSIS — C8580 Other specified types of non-Hodgkin lymphoma, unspecified site: Secondary | ICD-10-CM

## 2018-10-31 DIAGNOSIS — C4442 Squamous cell carcinoma of skin of scalp and neck: Secondary | ICD-10-CM | POA: Diagnosis not present

## 2018-10-31 DIAGNOSIS — C44622 Squamous cell carcinoma of skin of right upper limb, including shoulder: Secondary | ICD-10-CM | POA: Diagnosis not present

## 2018-11-01 ENCOUNTER — Inpatient Hospital Stay: Payer: Medicare Other

## 2018-11-01 ENCOUNTER — Inpatient Hospital Stay (HOSPITAL_BASED_OUTPATIENT_CLINIC_OR_DEPARTMENT_OTHER): Payer: Medicare Other | Admitting: Oncology

## 2018-11-01 ENCOUNTER — Other Ambulatory Visit: Payer: Self-pay | Admitting: *Deleted

## 2018-11-01 ENCOUNTER — Inpatient Hospital Stay: Payer: Medicare Other | Attending: Nurse Practitioner

## 2018-11-01 VITALS — BP 156/84 | HR 91 | Temp 98.2°F | Resp 18 | Ht 63.0 in | Wt 149.2 lb

## 2018-11-01 VITALS — BP 169/76 | HR 86 | Temp 98.4°F | Resp 18

## 2018-11-01 DIAGNOSIS — C8339 Diffuse large B-cell lymphoma, extranodal and solid organ sites: Secondary | ICD-10-CM | POA: Insufficient documentation

## 2018-11-01 DIAGNOSIS — Z85828 Personal history of other malignant neoplasm of skin: Secondary | ICD-10-CM | POA: Insufficient documentation

## 2018-11-01 DIAGNOSIS — Z5112 Encounter for antineoplastic immunotherapy: Secondary | ICD-10-CM | POA: Diagnosis not present

## 2018-11-01 DIAGNOSIS — Z79899 Other long term (current) drug therapy: Secondary | ICD-10-CM | POA: Insufficient documentation

## 2018-11-01 DIAGNOSIS — Z7689 Persons encountering health services in other specified circumstances: Secondary | ICD-10-CM | POA: Insufficient documentation

## 2018-11-01 DIAGNOSIS — C8580 Other specified types of non-Hodgkin lymphoma, unspecified site: Secondary | ICD-10-CM

## 2018-11-01 DIAGNOSIS — N189 Chronic kidney disease, unspecified: Secondary | ICD-10-CM | POA: Insufficient documentation

## 2018-11-01 DIAGNOSIS — D631 Anemia in chronic kidney disease: Secondary | ICD-10-CM | POA: Insufficient documentation

## 2018-11-01 DIAGNOSIS — Z87891 Personal history of nicotine dependence: Secondary | ICD-10-CM | POA: Diagnosis not present

## 2018-11-01 DIAGNOSIS — Z9049 Acquired absence of other specified parts of digestive tract: Secondary | ICD-10-CM | POA: Diagnosis not present

## 2018-11-01 DIAGNOSIS — I129 Hypertensive chronic kidney disease with stage 1 through stage 4 chronic kidney disease, or unspecified chronic kidney disease: Secondary | ICD-10-CM | POA: Insufficient documentation

## 2018-11-01 DIAGNOSIS — H409 Unspecified glaucoma: Secondary | ICD-10-CM | POA: Diagnosis not present

## 2018-11-01 DIAGNOSIS — R627 Adult failure to thrive: Secondary | ICD-10-CM | POA: Diagnosis not present

## 2018-11-01 DIAGNOSIS — Z90710 Acquired absence of both cervix and uterus: Secondary | ICD-10-CM | POA: Diagnosis not present

## 2018-11-01 LAB — CBC WITH DIFFERENTIAL (CANCER CENTER ONLY)
ABS IMMATURE GRANULOCYTES: 0.02 10*3/uL (ref 0.00–0.07)
BASOS PCT: 1 %
Basophils Absolute: 0 10*3/uL (ref 0.0–0.1)
Eosinophils Absolute: 0.3 10*3/uL (ref 0.0–0.5)
Eosinophils Relative: 7 %
HCT: 28.7 % — ABNORMAL LOW (ref 36.0–46.0)
HEMOGLOBIN: 9.5 g/dL — AB (ref 12.0–15.0)
IMMATURE GRANULOCYTES: 1 %
LYMPHS ABS: 0.7 10*3/uL (ref 0.7–4.0)
LYMPHS PCT: 16 %
MCH: 33.1 pg (ref 26.0–34.0)
MCHC: 33.1 g/dL (ref 30.0–36.0)
MCV: 100 fL (ref 80.0–100.0)
MONOS PCT: 16 %
Monocytes Absolute: 0.7 10*3/uL (ref 0.1–1.0)
NEUTROS ABS: 2.5 10*3/uL (ref 1.7–7.7)
Neutrophils Relative %: 59 %
Platelet Count: 265 10*3/uL (ref 150–400)
RBC: 2.87 MIL/uL — ABNORMAL LOW (ref 3.87–5.11)
RDW: 16.5 % — ABNORMAL HIGH (ref 11.5–15.5)
WBC Count: 4.1 10*3/uL (ref 4.0–10.5)
nRBC: 0 % (ref 0.0–0.2)

## 2018-11-01 LAB — CMP (CANCER CENTER ONLY)
ALK PHOS: 81 U/L (ref 38–126)
ALT: 9 U/L (ref 0–44)
AST: 20 U/L (ref 15–41)
Albumin: 2.8 g/dL — ABNORMAL LOW (ref 3.5–5.0)
Anion gap: 11 (ref 5–15)
BUN: 13 mg/dL (ref 8–23)
CO2: 29 mmol/L (ref 22–32)
CREATININE: 0.88 mg/dL (ref 0.44–1.00)
Calcium: 8.7 mg/dL — ABNORMAL LOW (ref 8.9–10.3)
Chloride: 106 mmol/L (ref 98–111)
GFR, Est AFR Am: >60 mL/min (ref >60)
Glucose, Bld: 99 mg/dL (ref 70–99)
Potassium: 3.1 mmol/L — ABNORMAL LOW (ref 3.5–5.1)
Sodium: 146 mmol/L — ABNORMAL HIGH (ref 135–145)
Total Bilirubin: 0.6 mg/dL (ref 0.3–1.2)
Total Protein: 5.3 g/dL — ABNORMAL LOW (ref 6.5–8.1)

## 2018-11-01 MED ORDER — DIPHENHYDRAMINE HCL 25 MG PO CAPS
ORAL_CAPSULE | ORAL | Status: AC
  Filled 2018-11-01: qty 1

## 2018-11-01 MED ORDER — POTASSIUM CHLORIDE ER 10 MEQ PO TBCR: 10.0000 meq | EXTENDED_RELEASE_TABLET | Freq: Two times a day (BID) | ORAL | 1 refills | Status: DC

## 2018-11-01 MED ORDER — DEXAMETHASONE SODIUM PHOSPHATE 10 MG/ML IJ SOLN
10.0000 mg | Freq: Once | INTRAMUSCULAR | Status: AC
  Administered 2018-11-01: 10 mg via INTRAVENOUS

## 2018-11-01 MED ORDER — ACETAMINOPHEN 325 MG PO TABS
650.0000 mg | ORAL_TABLET | Freq: Once | ORAL | Status: AC
  Administered 2018-11-01: 650 mg via ORAL

## 2018-11-01 MED ORDER — SODIUM CHLORIDE 0.9 % IV SOLN
375.0000 mg/m2 | Freq: Once | INTRAVENOUS | Status: AC
  Administered 2018-11-01: 700 mg via INTRAVENOUS
  Filled 2018-11-01: qty 50

## 2018-11-01 MED ORDER — PREDNISONE 20 MG PO TABS: ORAL_TABLET | ORAL | 0 refills | Status: DC

## 2018-11-01 MED ORDER — SODIUM CHLORIDE 0.9 % IV SOLN
600.0000 mg/m2 | Freq: Once | INTRAVENOUS | Status: AC
  Administered 2018-11-01: 1040 mg via INTRAVENOUS
  Filled 2018-11-01: qty 52

## 2018-11-01 MED ORDER — PALONOSETRON HCL INJECTION 0.25 MG/5ML
INTRAVENOUS | Status: AC
  Filled 2018-11-01: qty 5

## 2018-11-01 MED ORDER — ACETAMINOPHEN 325 MG PO TABS
ORAL_TABLET | ORAL | Status: AC
  Filled 2018-11-01: qty 2

## 2018-11-01 MED ORDER — SODIUM CHLORIDE 0.9% FLUSH
10.0000 mL | INTRAVENOUS | Status: DC | PRN
  Administered 2018-11-01: 10 mL
  Filled 2018-11-01: qty 10

## 2018-11-01 MED ORDER — VINCRISTINE SULFATE CHEMO INJECTION 1 MG/ML
1.0000 mg | Freq: Once | INTRAVENOUS | Status: AC
  Administered 2018-11-01: 1 mg via INTRAVENOUS
  Filled 2018-11-01: qty 1

## 2018-11-01 MED ORDER — HEPARIN SOD (PORK) LOCK FLUSH 100 UNIT/ML IV SOLN
250.0000 [IU] | Freq: Once | INTRAVENOUS | Status: AC | PRN
  Administered 2018-11-01: 250 [IU]
  Filled 2018-11-01: qty 5

## 2018-11-01 MED ORDER — DIPHENHYDRAMINE HCL 25 MG PO CAPS
25.0000 mg | ORAL_CAPSULE | Freq: Once | ORAL | Status: AC
  Administered 2018-11-01: 25 mg via ORAL

## 2018-11-01 MED ORDER — SODIUM CHLORIDE 0.9 % IV SOLN
Freq: Once | INTRAVENOUS | Status: AC
  Administered 2018-11-01: 10:00:00 via INTRAVENOUS
  Filled 2018-11-01: qty 250

## 2018-11-01 MED ORDER — SODIUM CHLORIDE 0.9% FLUSH
10.0000 mL | Freq: Once | INTRAVENOUS | Status: AC
  Administered 2018-11-01: 10 mL
  Filled 2018-11-01: qty 10

## 2018-11-01 MED ORDER — DEXAMETHASONE SODIUM PHOSPHATE 10 MG/ML IJ SOLN
INTRAMUSCULAR | Status: AC
  Filled 2018-11-01: qty 1

## 2018-11-01 MED ORDER — PALONOSETRON HCL INJECTION 0.25 MG/5ML
0.2500 mg | Freq: Once | INTRAVENOUS | Status: AC
  Administered 2018-11-01: 0.25 mg via INTRAVENOUS

## 2018-11-01 NOTE — Patient Instructions (Signed)
Increase your K+ to 10 meq twice daily

## 2018-11-01 NOTE — Progress Notes (Signed)
[place] OFFICE PROGRESS NOTE   Diagnosis: Non-Hodgkin's lymphoma  INTERVAL HISTORY:   Ms Vassel returns as scheduled.  She completed another cycle of CVP-rituximab on 10/03/2018.  She tolerated the chemotherapy well.  Occasional tingling in the extremities.  No nausea or vomiting.  She has chronic low back pain.  The severe pain she had prior to the diagnosis of lymphoma has improved.  Objective:  Vital signs in last 24 hours:  Blood pressure (!) 156/84, pulse 91, temperature 98.2 F (36.8 C), temperature source Oral, resp. rate 18, height 5' 3"  (1.6 m), weight 149 lb 3.2 oz (67.7 kg), SpO2 96 %.    HEENT: No thrush or ulcers Resp: Lungs clear bilaterally Cardio: Regular rate and rhythm GI: No hepatosplenomegaly Vascular: No leg edema    Portacath/PICC-without erythema  Lab Results:  Lab Results  Component Value Date   WBC 4.1 11/01/2018   HGB 9.5 (L) 11/01/2018   HCT 28.7 (L) 11/01/2018   MCV 100.0 11/01/2018   PLT 265 11/01/2018   NEUTROABS 2.5 11/01/2018    CMP  Lab Results  Component Value Date   NA 146 (H) 11/01/2018   K 3.1 (L) 11/01/2018   CL 106 11/01/2018   CO2 29 11/01/2018   GLUCOSE 99 11/01/2018   BUN 13 11/01/2018   CREATININE 0.88 11/01/2018   CALCIUM 8.7 (L) 11/01/2018   PROT 5.3 (L) 11/01/2018   ALBUMIN 2.8 (L) 11/01/2018   AST 20 11/01/2018   ALT 9 11/01/2018   ALKPHOS 81 11/01/2018   BILITOT 0.6 11/01/2018   GFRNONAA >60 11/01/2018   GFRAA >60 11/01/2018    Lab Results  Component Value Date   CEA1 2.2 07/12/2018    Lab Results  Component Value Date   INR 0.95 07/26/2018    Imaging:  No results found.  Medications: I have reviewed the patient's current medications.   Assessment/Plan: 1. 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 Loss of MLH1 and PMS2, MSI-high, BRAF mutation detected ? Colonoscopy February  2017 ? Colonoscopy 12/16/2017-3 mm polyp in the transverse colon, 10 mm polyp in the sigmoid colon. Diverticulosis in the sigmoid colon. Transverse colon polyp, tubular adenoma; sigmoid colon polyp, tubular adenoma. ? CT 07/12/2018-probable subcutaneous metastasis at the left abdominal wall, left perinephric mass, destructive thoracic/lumbar paraspinous mass ? CT-guided biopsy of the paraspinous mass 07/13/2018  2. Anemia-likely secondary to bleeding from the colon cancer and surgery. Hemoglobin in normal range 03/20/2016, mild persistent anemia 3. Ascending colon "polyps" removed on the colonoscopy 12/22/2014 with the pathology revealing poorly differentiated adenocarcinoma and a sessile serrated adenoma polyp, similar to the pathology on the hepatic flexure mass 4. History of multiple skin cancers. Seen by dermatology every 3 months. 5. Glaucoma 6.Non-Hodgkin's lymphoma presenting with severe back pain  CTabdomen/pelvis7/30/2019-large destructive right paraspinal chest wall mass lower thoracic/upper lumbar spinewith associated bone destruction involving the right posterior elements of T9, T10 and T11, spinous processes of T8 and T9, and the posterior right 10th and 11th ribs. Probable subcutaneous metastasis anterior left abdominal wall 2.2 cm; enlarging mass in the left perinephric space 3.0 cm in size.  CT L-spine 07/12/2018-paraspinous soft tissue mass on the right at the lower thoracic spine causing destruction of the right T11 and 12 transverse processes; may encroach on the right T11 neural foramen.  CT-guided biopsy paraspinous mass 07/13/2018-poorly differentiated malignant neoplasm; CD45, CD20,bcl-6 andbcl-2positive. 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  Radiationto paraspinous massinitiated 07/14/2018  Cycle 1 CVP/Rituxan 08/02/2018 08/29/2018-decreased size of right paraspinal soft tissue mass,  PET scan  7/89/3810-[id number] mild metabolic activity within the right paraspinal mass which is decreased significantly in size from the comparison exam. No evidence of lymphoma elsewhere. Focal activity within the distal esophagus above the GE junction without mass lesion or obstruction.  Cycle 2 CVP/Rituxan 08/22/2018,udenyca added  CT abdomen/pelvis-decrease in size of the right paraspinal soft tissue mass with adjacent lytic abnormality in the right posterior 10th and 11th ribs.  Cycle 3 CVP/Rituxan 09/12/2018-Cytoxan dose reduced  Cycle 4 CVP/rituximab 10/03/2018  Cycle 5 CVP/rituximab 11/01/2018 7.Anorexia/weight loss 8.Mild elevation of calcium-questionhypercalcemia of malignancy 9.History of severe neutropeniaand anemiasecondary to radiation and chemotherapy; udenyca added with cycle 2CVP/RituxanCytoxan dose reduced with cycle 3  10. Admission 08/29/2018 with failure to thrive and severe anemia/neutropenia, status post red cell transfusions on 08/30/2018      Disposition: Ms. Fosco appears stable.  She will complete cycle 5 CVP/rituximab today.  Her performance status is much improved compared to when she was diagnosed with lymphoma.  The plan is to complete a total of 6 cycles of CVP/rituximab.  We will consider a restaging CT evaluation after cycle 6.  She will return for an office visit in the next cycle of chemotherapy on 11/22/2018.  We increase the potassium to twice daily.  She continues to receive Neulasta support with chemotherapy.  Betsy Coder, MD  11/01/2018  9:49 AM

## 2018-11-02 ENCOUNTER — Inpatient Hospital Stay: Payer: Medicare Other

## 2018-11-02 DIAGNOSIS — E785 Hyperlipidemia, unspecified: Secondary | ICD-10-CM | POA: Diagnosis not present

## 2018-11-02 DIAGNOSIS — K5900 Constipation, unspecified: Secondary | ICD-10-CM | POA: Diagnosis not present

## 2018-11-02 DIAGNOSIS — C8580 Other specified types of non-Hodgkin lymphoma, unspecified site: Secondary | ICD-10-CM

## 2018-11-02 DIAGNOSIS — Z79899 Other long term (current) drug therapy: Secondary | ICD-10-CM | POA: Diagnosis not present

## 2018-11-02 DIAGNOSIS — G2581 Restless legs syndrome: Secondary | ICD-10-CM | POA: Diagnosis not present

## 2018-11-02 DIAGNOSIS — Z85038 Personal history of other malignant neoplasm of large intestine: Secondary | ICD-10-CM | POA: Diagnosis not present

## 2018-11-02 DIAGNOSIS — C8339 Diffuse large B-cell lymphoma, extranodal and solid organ sites: Secondary | ICD-10-CM | POA: Diagnosis not present

## 2018-11-02 DIAGNOSIS — Z87891 Personal history of nicotine dependence: Secondary | ICD-10-CM | POA: Diagnosis not present

## 2018-11-02 DIAGNOSIS — Z7982 Long term (current) use of aspirin: Secondary | ICD-10-CM | POA: Diagnosis not present

## 2018-11-02 DIAGNOSIS — Z85828 Personal history of other malignant neoplasm of skin: Secondary | ICD-10-CM | POA: Diagnosis not present

## 2018-11-02 DIAGNOSIS — Z9049 Acquired absence of other specified parts of digestive tract: Secondary | ICD-10-CM | POA: Diagnosis not present

## 2018-11-02 DIAGNOSIS — D631 Anemia in chronic kidney disease: Secondary | ICD-10-CM | POA: Diagnosis not present

## 2018-11-02 DIAGNOSIS — R296 Repeated falls: Secondary | ICD-10-CM | POA: Diagnosis not present

## 2018-11-02 DIAGNOSIS — E860 Dehydration: Secondary | ICD-10-CM | POA: Diagnosis not present

## 2018-11-02 DIAGNOSIS — Z7689 Persons encountering health services in other specified circumstances: Secondary | ICD-10-CM | POA: Diagnosis not present

## 2018-11-02 DIAGNOSIS — M1990 Unspecified osteoarthritis, unspecified site: Secondary | ICD-10-CM | POA: Diagnosis not present

## 2018-11-02 DIAGNOSIS — Z5112 Encounter for antineoplastic immunotherapy: Secondary | ICD-10-CM | POA: Diagnosis not present

## 2018-11-02 DIAGNOSIS — J449 Chronic obstructive pulmonary disease, unspecified: Secondary | ICD-10-CM | POA: Diagnosis not present

## 2018-11-02 DIAGNOSIS — I129 Hypertensive chronic kidney disease with stage 1 through stage 4 chronic kidney disease, or unspecified chronic kidney disease: Secondary | ICD-10-CM | POA: Diagnosis not present

## 2018-11-02 DIAGNOSIS — N189 Chronic kidney disease, unspecified: Secondary | ICD-10-CM | POA: Diagnosis not present

## 2018-11-02 DIAGNOSIS — C8330 Diffuse large B-cell lymphoma, unspecified site: Secondary | ICD-10-CM | POA: Diagnosis not present

## 2018-11-02 DIAGNOSIS — H409 Unspecified glaucoma: Secondary | ICD-10-CM | POA: Diagnosis not present

## 2018-11-02 DIAGNOSIS — K219 Gastro-esophageal reflux disease without esophagitis: Secondary | ICD-10-CM | POA: Diagnosis not present

## 2018-11-02 DIAGNOSIS — Z9181 History of falling: Secondary | ICD-10-CM | POA: Diagnosis not present

## 2018-11-02 DIAGNOSIS — I951 Orthostatic hypotension: Secondary | ICD-10-CM | POA: Diagnosis not present

## 2018-11-02 DIAGNOSIS — N183 Chronic kidney disease, stage 3 (moderate): Secondary | ICD-10-CM | POA: Diagnosis not present

## 2018-11-02 DIAGNOSIS — R627 Adult failure to thrive: Secondary | ICD-10-CM | POA: Diagnosis not present

## 2018-11-02 MED ORDER — PEGFILGRASTIM INJECTION 6 MG/0.6ML ~~LOC~~
PREFILLED_SYRINGE | SUBCUTANEOUS | Status: AC
  Filled 2018-11-02: qty 0.6

## 2018-11-02 MED ORDER — PEGFILGRASTIM INJECTION 6 MG/0.6ML ~~LOC~~
6.0000 mg | PREFILLED_SYRINGE | Freq: Once | SUBCUTANEOUS | Status: AC
  Administered 2018-11-02: 6 mg via SUBCUTANEOUS

## 2018-11-03 DIAGNOSIS — J449 Chronic obstructive pulmonary disease, unspecified: Secondary | ICD-10-CM | POA: Diagnosis not present

## 2018-11-03 DIAGNOSIS — K5900 Constipation, unspecified: Secondary | ICD-10-CM | POA: Diagnosis not present

## 2018-11-03 DIAGNOSIS — E860 Dehydration: Secondary | ICD-10-CM | POA: Diagnosis not present

## 2018-11-03 DIAGNOSIS — R296 Repeated falls: Secondary | ICD-10-CM | POA: Diagnosis not present

## 2018-11-03 DIAGNOSIS — D631 Anemia in chronic kidney disease: Secondary | ICD-10-CM | POA: Diagnosis not present

## 2018-11-03 DIAGNOSIS — N183 Chronic kidney disease, stage 3 (moderate): Secondary | ICD-10-CM | POA: Diagnosis not present

## 2018-11-03 DIAGNOSIS — I951 Orthostatic hypotension: Secondary | ICD-10-CM | POA: Diagnosis not present

## 2018-11-03 DIAGNOSIS — M1990 Unspecified osteoarthritis, unspecified site: Secondary | ICD-10-CM | POA: Diagnosis not present

## 2018-11-03 DIAGNOSIS — Z87891 Personal history of nicotine dependence: Secondary | ICD-10-CM | POA: Diagnosis not present

## 2018-11-03 DIAGNOSIS — Z85038 Personal history of other malignant neoplasm of large intestine: Secondary | ICD-10-CM | POA: Diagnosis not present

## 2018-11-03 DIAGNOSIS — K219 Gastro-esophageal reflux disease without esophagitis: Secondary | ICD-10-CM | POA: Diagnosis not present

## 2018-11-03 DIAGNOSIS — I129 Hypertensive chronic kidney disease with stage 1 through stage 4 chronic kidney disease, or unspecified chronic kidney disease: Secondary | ICD-10-CM | POA: Diagnosis not present

## 2018-11-03 DIAGNOSIS — Z9181 History of falling: Secondary | ICD-10-CM | POA: Diagnosis not present

## 2018-11-03 DIAGNOSIS — E785 Hyperlipidemia, unspecified: Secondary | ICD-10-CM | POA: Diagnosis not present

## 2018-11-03 DIAGNOSIS — C8330 Diffuse large B-cell lymphoma, unspecified site: Secondary | ICD-10-CM | POA: Diagnosis not present

## 2018-11-03 DIAGNOSIS — Z7982 Long term (current) use of aspirin: Secondary | ICD-10-CM | POA: Diagnosis not present

## 2018-11-03 DIAGNOSIS — G2581 Restless legs syndrome: Secondary | ICD-10-CM | POA: Diagnosis not present

## 2018-11-04 DIAGNOSIS — E785 Hyperlipidemia, unspecified: Secondary | ICD-10-CM | POA: Diagnosis not present

## 2018-11-04 DIAGNOSIS — G2581 Restless legs syndrome: Secondary | ICD-10-CM | POA: Diagnosis not present

## 2018-11-04 DIAGNOSIS — C8330 Diffuse large B-cell lymphoma, unspecified site: Secondary | ICD-10-CM | POA: Diagnosis not present

## 2018-11-04 DIAGNOSIS — I129 Hypertensive chronic kidney disease with stage 1 through stage 4 chronic kidney disease, or unspecified chronic kidney disease: Secondary | ICD-10-CM | POA: Diagnosis not present

## 2018-11-04 DIAGNOSIS — N183 Chronic kidney disease, stage 3 (moderate): Secondary | ICD-10-CM | POA: Diagnosis not present

## 2018-11-04 DIAGNOSIS — M1990 Unspecified osteoarthritis, unspecified site: Secondary | ICD-10-CM | POA: Diagnosis not present

## 2018-11-04 DIAGNOSIS — E860 Dehydration: Secondary | ICD-10-CM | POA: Diagnosis not present

## 2018-11-04 DIAGNOSIS — I951 Orthostatic hypotension: Secondary | ICD-10-CM | POA: Diagnosis not present

## 2018-11-04 DIAGNOSIS — K5900 Constipation, unspecified: Secondary | ICD-10-CM | POA: Diagnosis not present

## 2018-11-04 DIAGNOSIS — D631 Anemia in chronic kidney disease: Secondary | ICD-10-CM | POA: Diagnosis not present

## 2018-11-04 DIAGNOSIS — Z87891 Personal history of nicotine dependence: Secondary | ICD-10-CM | POA: Diagnosis not present

## 2018-11-04 DIAGNOSIS — K219 Gastro-esophageal reflux disease without esophagitis: Secondary | ICD-10-CM | POA: Diagnosis not present

## 2018-11-04 DIAGNOSIS — J449 Chronic obstructive pulmonary disease, unspecified: Secondary | ICD-10-CM | POA: Diagnosis not present

## 2018-11-04 DIAGNOSIS — Z7982 Long term (current) use of aspirin: Secondary | ICD-10-CM | POA: Diagnosis not present

## 2018-11-04 DIAGNOSIS — Z85038 Personal history of other malignant neoplasm of large intestine: Secondary | ICD-10-CM | POA: Diagnosis not present

## 2018-11-04 DIAGNOSIS — Z9181 History of falling: Secondary | ICD-10-CM | POA: Diagnosis not present

## 2018-11-04 DIAGNOSIS — R296 Repeated falls: Secondary | ICD-10-CM | POA: Diagnosis not present

## 2018-11-07 DIAGNOSIS — I129 Hypertensive chronic kidney disease with stage 1 through stage 4 chronic kidney disease, or unspecified chronic kidney disease: Secondary | ICD-10-CM | POA: Diagnosis not present

## 2018-11-07 DIAGNOSIS — Z87891 Personal history of nicotine dependence: Secondary | ICD-10-CM | POA: Diagnosis not present

## 2018-11-07 DIAGNOSIS — N183 Chronic kidney disease, stage 3 (moderate): Secondary | ICD-10-CM | POA: Diagnosis not present

## 2018-11-07 DIAGNOSIS — Z85038 Personal history of other malignant neoplasm of large intestine: Secondary | ICD-10-CM | POA: Diagnosis not present

## 2018-11-07 DIAGNOSIS — Z7982 Long term (current) use of aspirin: Secondary | ICD-10-CM | POA: Diagnosis not present

## 2018-11-07 DIAGNOSIS — M1990 Unspecified osteoarthritis, unspecified site: Secondary | ICD-10-CM | POA: Diagnosis not present

## 2018-11-07 DIAGNOSIS — I951 Orthostatic hypotension: Secondary | ICD-10-CM | POA: Diagnosis not present

## 2018-11-07 DIAGNOSIS — G2581 Restless legs syndrome: Secondary | ICD-10-CM | POA: Diagnosis not present

## 2018-11-07 DIAGNOSIS — E785 Hyperlipidemia, unspecified: Secondary | ICD-10-CM | POA: Diagnosis not present

## 2018-11-07 DIAGNOSIS — D631 Anemia in chronic kidney disease: Secondary | ICD-10-CM | POA: Diagnosis not present

## 2018-11-07 DIAGNOSIS — K5900 Constipation, unspecified: Secondary | ICD-10-CM | POA: Diagnosis not present

## 2018-11-07 DIAGNOSIS — Z9181 History of falling: Secondary | ICD-10-CM | POA: Diagnosis not present

## 2018-11-07 DIAGNOSIS — J449 Chronic obstructive pulmonary disease, unspecified: Secondary | ICD-10-CM | POA: Diagnosis not present

## 2018-11-07 DIAGNOSIS — C8330 Diffuse large B-cell lymphoma, unspecified site: Secondary | ICD-10-CM | POA: Diagnosis not present

## 2018-11-07 DIAGNOSIS — E860 Dehydration: Secondary | ICD-10-CM | POA: Diagnosis not present

## 2018-11-07 DIAGNOSIS — R296 Repeated falls: Secondary | ICD-10-CM | POA: Diagnosis not present

## 2018-11-07 DIAGNOSIS — K219 Gastro-esophageal reflux disease without esophagitis: Secondary | ICD-10-CM | POA: Diagnosis not present

## 2018-11-08 DIAGNOSIS — G2581 Restless legs syndrome: Secondary | ICD-10-CM | POA: Diagnosis not present

## 2018-11-08 DIAGNOSIS — N183 Chronic kidney disease, stage 3 (moderate): Secondary | ICD-10-CM | POA: Diagnosis not present

## 2018-11-08 DIAGNOSIS — K5900 Constipation, unspecified: Secondary | ICD-10-CM | POA: Diagnosis not present

## 2018-11-08 DIAGNOSIS — C8330 Diffuse large B-cell lymphoma, unspecified site: Secondary | ICD-10-CM | POA: Diagnosis not present

## 2018-11-08 DIAGNOSIS — R296 Repeated falls: Secondary | ICD-10-CM | POA: Diagnosis not present

## 2018-11-08 DIAGNOSIS — Z85038 Personal history of other malignant neoplasm of large intestine: Secondary | ICD-10-CM | POA: Diagnosis not present

## 2018-11-08 DIAGNOSIS — J449 Chronic obstructive pulmonary disease, unspecified: Secondary | ICD-10-CM | POA: Diagnosis not present

## 2018-11-08 DIAGNOSIS — Z9181 History of falling: Secondary | ICD-10-CM | POA: Diagnosis not present

## 2018-11-08 DIAGNOSIS — Z87891 Personal history of nicotine dependence: Secondary | ICD-10-CM | POA: Diagnosis not present

## 2018-11-08 DIAGNOSIS — I129 Hypertensive chronic kidney disease with stage 1 through stage 4 chronic kidney disease, or unspecified chronic kidney disease: Secondary | ICD-10-CM | POA: Diagnosis not present

## 2018-11-08 DIAGNOSIS — E785 Hyperlipidemia, unspecified: Secondary | ICD-10-CM | POA: Diagnosis not present

## 2018-11-08 DIAGNOSIS — I951 Orthostatic hypotension: Secondary | ICD-10-CM | POA: Diagnosis not present

## 2018-11-08 DIAGNOSIS — E860 Dehydration: Secondary | ICD-10-CM | POA: Diagnosis not present

## 2018-11-08 DIAGNOSIS — D631 Anemia in chronic kidney disease: Secondary | ICD-10-CM | POA: Diagnosis not present

## 2018-11-08 DIAGNOSIS — M1990 Unspecified osteoarthritis, unspecified site: Secondary | ICD-10-CM | POA: Diagnosis not present

## 2018-11-08 DIAGNOSIS — K219 Gastro-esophageal reflux disease without esophagitis: Secondary | ICD-10-CM | POA: Diagnosis not present

## 2018-11-08 DIAGNOSIS — Z7982 Long term (current) use of aspirin: Secondary | ICD-10-CM | POA: Diagnosis not present

## 2018-11-11 DIAGNOSIS — Z7982 Long term (current) use of aspirin: Secondary | ICD-10-CM | POA: Diagnosis not present

## 2018-11-11 DIAGNOSIS — K5900 Constipation, unspecified: Secondary | ICD-10-CM | POA: Diagnosis not present

## 2018-11-11 DIAGNOSIS — I129 Hypertensive chronic kidney disease with stage 1 through stage 4 chronic kidney disease, or unspecified chronic kidney disease: Secondary | ICD-10-CM | POA: Diagnosis not present

## 2018-11-11 DIAGNOSIS — K219 Gastro-esophageal reflux disease without esophagitis: Secondary | ICD-10-CM | POA: Diagnosis not present

## 2018-11-11 DIAGNOSIS — N183 Chronic kidney disease, stage 3 (moderate): Secondary | ICD-10-CM | POA: Diagnosis not present

## 2018-11-11 DIAGNOSIS — C8330 Diffuse large B-cell lymphoma, unspecified site: Secondary | ICD-10-CM | POA: Diagnosis not present

## 2018-11-11 DIAGNOSIS — Z87891 Personal history of nicotine dependence: Secondary | ICD-10-CM | POA: Diagnosis not present

## 2018-11-11 DIAGNOSIS — R296 Repeated falls: Secondary | ICD-10-CM | POA: Diagnosis not present

## 2018-11-11 DIAGNOSIS — Z9181 History of falling: Secondary | ICD-10-CM | POA: Diagnosis not present

## 2018-11-11 DIAGNOSIS — E785 Hyperlipidemia, unspecified: Secondary | ICD-10-CM | POA: Diagnosis not present

## 2018-11-11 DIAGNOSIS — D631 Anemia in chronic kidney disease: Secondary | ICD-10-CM | POA: Diagnosis not present

## 2018-11-11 DIAGNOSIS — J449 Chronic obstructive pulmonary disease, unspecified: Secondary | ICD-10-CM | POA: Diagnosis not present

## 2018-11-11 DIAGNOSIS — I951 Orthostatic hypotension: Secondary | ICD-10-CM | POA: Diagnosis not present

## 2018-11-11 DIAGNOSIS — Z85038 Personal history of other malignant neoplasm of large intestine: Secondary | ICD-10-CM | POA: Diagnosis not present

## 2018-11-11 DIAGNOSIS — E860 Dehydration: Secondary | ICD-10-CM | POA: Diagnosis not present

## 2018-11-11 DIAGNOSIS — G2581 Restless legs syndrome: Secondary | ICD-10-CM | POA: Diagnosis not present

## 2018-11-11 DIAGNOSIS — M1990 Unspecified osteoarthritis, unspecified site: Secondary | ICD-10-CM | POA: Diagnosis not present

## 2018-11-14 DIAGNOSIS — I951 Orthostatic hypotension: Secondary | ICD-10-CM | POA: Diagnosis not present

## 2018-11-14 DIAGNOSIS — Z9181 History of falling: Secondary | ICD-10-CM | POA: Diagnosis not present

## 2018-11-14 DIAGNOSIS — E785 Hyperlipidemia, unspecified: Secondary | ICD-10-CM | POA: Diagnosis not present

## 2018-11-14 DIAGNOSIS — R296 Repeated falls: Secondary | ICD-10-CM | POA: Diagnosis not present

## 2018-11-14 DIAGNOSIS — C8330 Diffuse large B-cell lymphoma, unspecified site: Secondary | ICD-10-CM | POA: Diagnosis not present

## 2018-11-14 DIAGNOSIS — M1990 Unspecified osteoarthritis, unspecified site: Secondary | ICD-10-CM | POA: Diagnosis not present

## 2018-11-14 DIAGNOSIS — Z87891 Personal history of nicotine dependence: Secondary | ICD-10-CM | POA: Diagnosis not present

## 2018-11-14 DIAGNOSIS — I129 Hypertensive chronic kidney disease with stage 1 through stage 4 chronic kidney disease, or unspecified chronic kidney disease: Secondary | ICD-10-CM | POA: Diagnosis not present

## 2018-11-14 DIAGNOSIS — K5900 Constipation, unspecified: Secondary | ICD-10-CM | POA: Diagnosis not present

## 2018-11-14 DIAGNOSIS — D631 Anemia in chronic kidney disease: Secondary | ICD-10-CM | POA: Diagnosis not present

## 2018-11-14 DIAGNOSIS — K219 Gastro-esophageal reflux disease without esophagitis: Secondary | ICD-10-CM | POA: Diagnosis not present

## 2018-11-14 DIAGNOSIS — J449 Chronic obstructive pulmonary disease, unspecified: Secondary | ICD-10-CM | POA: Diagnosis not present

## 2018-11-14 DIAGNOSIS — E860 Dehydration: Secondary | ICD-10-CM | POA: Diagnosis not present

## 2018-11-14 DIAGNOSIS — G2581 Restless legs syndrome: Secondary | ICD-10-CM | POA: Diagnosis not present

## 2018-11-14 DIAGNOSIS — Z85038 Personal history of other malignant neoplasm of large intestine: Secondary | ICD-10-CM | POA: Diagnosis not present

## 2018-11-14 DIAGNOSIS — Z7982 Long term (current) use of aspirin: Secondary | ICD-10-CM | POA: Diagnosis not present

## 2018-11-14 DIAGNOSIS — N183 Chronic kidney disease, stage 3 (moderate): Secondary | ICD-10-CM | POA: Diagnosis not present

## 2018-11-15 DIAGNOSIS — J449 Chronic obstructive pulmonary disease, unspecified: Secondary | ICD-10-CM | POA: Diagnosis not present

## 2018-11-15 DIAGNOSIS — E785 Hyperlipidemia, unspecified: Secondary | ICD-10-CM | POA: Diagnosis not present

## 2018-11-15 DIAGNOSIS — I951 Orthostatic hypotension: Secondary | ICD-10-CM | POA: Diagnosis not present

## 2018-11-15 DIAGNOSIS — C8330 Diffuse large B-cell lymphoma, unspecified site: Secondary | ICD-10-CM | POA: Diagnosis not present

## 2018-11-16 DIAGNOSIS — Z85038 Personal history of other malignant neoplasm of large intestine: Secondary | ICD-10-CM | POA: Diagnosis not present

## 2018-11-16 DIAGNOSIS — I129 Hypertensive chronic kidney disease with stage 1 through stage 4 chronic kidney disease, or unspecified chronic kidney disease: Secondary | ICD-10-CM | POA: Diagnosis not present

## 2018-11-16 DIAGNOSIS — K5900 Constipation, unspecified: Secondary | ICD-10-CM | POA: Diagnosis not present

## 2018-11-16 DIAGNOSIS — I951 Orthostatic hypotension: Secondary | ICD-10-CM | POA: Diagnosis not present

## 2018-11-16 DIAGNOSIS — C8330 Diffuse large B-cell lymphoma, unspecified site: Secondary | ICD-10-CM | POA: Diagnosis not present

## 2018-11-16 DIAGNOSIS — N183 Chronic kidney disease, stage 3 (moderate): Secondary | ICD-10-CM | POA: Diagnosis not present

## 2018-11-16 DIAGNOSIS — K219 Gastro-esophageal reflux disease without esophagitis: Secondary | ICD-10-CM | POA: Diagnosis not present

## 2018-11-16 DIAGNOSIS — D631 Anemia in chronic kidney disease: Secondary | ICD-10-CM | POA: Diagnosis not present

## 2018-11-16 DIAGNOSIS — Z7982 Long term (current) use of aspirin: Secondary | ICD-10-CM | POA: Diagnosis not present

## 2018-11-16 DIAGNOSIS — E785 Hyperlipidemia, unspecified: Secondary | ICD-10-CM | POA: Diagnosis not present

## 2018-11-16 DIAGNOSIS — Z87891 Personal history of nicotine dependence: Secondary | ICD-10-CM | POA: Diagnosis not present

## 2018-11-16 DIAGNOSIS — E860 Dehydration: Secondary | ICD-10-CM | POA: Diagnosis not present

## 2018-11-16 DIAGNOSIS — R296 Repeated falls: Secondary | ICD-10-CM | POA: Diagnosis not present

## 2018-11-16 DIAGNOSIS — G2581 Restless legs syndrome: Secondary | ICD-10-CM | POA: Diagnosis not present

## 2018-11-16 DIAGNOSIS — M1990 Unspecified osteoarthritis, unspecified site: Secondary | ICD-10-CM | POA: Diagnosis not present

## 2018-11-16 DIAGNOSIS — Z9181 History of falling: Secondary | ICD-10-CM | POA: Diagnosis not present

## 2018-11-16 DIAGNOSIS — J449 Chronic obstructive pulmonary disease, unspecified: Secondary | ICD-10-CM | POA: Diagnosis not present

## 2018-11-20 ENCOUNTER — Other Ambulatory Visit: Payer: Self-pay | Admitting: Oncology

## 2018-11-21 DIAGNOSIS — G2581 Restless legs syndrome: Secondary | ICD-10-CM | POA: Diagnosis not present

## 2018-11-21 DIAGNOSIS — Z9181 History of falling: Secondary | ICD-10-CM | POA: Diagnosis not present

## 2018-11-21 DIAGNOSIS — K219 Gastro-esophageal reflux disease without esophagitis: Secondary | ICD-10-CM | POA: Diagnosis not present

## 2018-11-21 DIAGNOSIS — E785 Hyperlipidemia, unspecified: Secondary | ICD-10-CM | POA: Diagnosis not present

## 2018-11-21 DIAGNOSIS — D631 Anemia in chronic kidney disease: Secondary | ICD-10-CM | POA: Diagnosis not present

## 2018-11-21 DIAGNOSIS — R296 Repeated falls: Secondary | ICD-10-CM | POA: Diagnosis not present

## 2018-11-21 DIAGNOSIS — Z85038 Personal history of other malignant neoplasm of large intestine: Secondary | ICD-10-CM | POA: Diagnosis not present

## 2018-11-21 DIAGNOSIS — M1990 Unspecified osteoarthritis, unspecified site: Secondary | ICD-10-CM | POA: Diagnosis not present

## 2018-11-21 DIAGNOSIS — K5900 Constipation, unspecified: Secondary | ICD-10-CM | POA: Diagnosis not present

## 2018-11-21 DIAGNOSIS — I129 Hypertensive chronic kidney disease with stage 1 through stage 4 chronic kidney disease, or unspecified chronic kidney disease: Secondary | ICD-10-CM | POA: Diagnosis not present

## 2018-11-21 DIAGNOSIS — E860 Dehydration: Secondary | ICD-10-CM | POA: Diagnosis not present

## 2018-11-21 DIAGNOSIS — Z87891 Personal history of nicotine dependence: Secondary | ICD-10-CM | POA: Diagnosis not present

## 2018-11-21 DIAGNOSIS — J449 Chronic obstructive pulmonary disease, unspecified: Secondary | ICD-10-CM | POA: Diagnosis not present

## 2018-11-21 DIAGNOSIS — C8330 Diffuse large B-cell lymphoma, unspecified site: Secondary | ICD-10-CM | POA: Diagnosis not present

## 2018-11-21 DIAGNOSIS — I951 Orthostatic hypotension: Secondary | ICD-10-CM | POA: Diagnosis not present

## 2018-11-21 DIAGNOSIS — Z7982 Long term (current) use of aspirin: Secondary | ICD-10-CM | POA: Diagnosis not present

## 2018-11-21 DIAGNOSIS — N183 Chronic kidney disease, stage 3 (moderate): Secondary | ICD-10-CM | POA: Diagnosis not present

## 2018-11-22 DIAGNOSIS — E785 Hyperlipidemia, unspecified: Secondary | ICD-10-CM | POA: Diagnosis not present

## 2018-11-22 DIAGNOSIS — J449 Chronic obstructive pulmonary disease, unspecified: Secondary | ICD-10-CM | POA: Diagnosis not present

## 2018-11-22 DIAGNOSIS — N183 Chronic kidney disease, stage 3 (moderate): Secondary | ICD-10-CM | POA: Diagnosis not present

## 2018-11-22 DIAGNOSIS — I951 Orthostatic hypotension: Secondary | ICD-10-CM | POA: Diagnosis not present

## 2018-11-22 DIAGNOSIS — I129 Hypertensive chronic kidney disease with stage 1 through stage 4 chronic kidney disease, or unspecified chronic kidney disease: Secondary | ICD-10-CM | POA: Diagnosis not present

## 2018-11-22 DIAGNOSIS — Z85038 Personal history of other malignant neoplasm of large intestine: Secondary | ICD-10-CM | POA: Diagnosis not present

## 2018-11-22 DIAGNOSIS — Z7982 Long term (current) use of aspirin: Secondary | ICD-10-CM | POA: Diagnosis not present

## 2018-11-22 DIAGNOSIS — R296 Repeated falls: Secondary | ICD-10-CM | POA: Diagnosis not present

## 2018-11-22 DIAGNOSIS — E860 Dehydration: Secondary | ICD-10-CM | POA: Diagnosis not present

## 2018-11-22 DIAGNOSIS — Z87891 Personal history of nicotine dependence: Secondary | ICD-10-CM | POA: Diagnosis not present

## 2018-11-22 DIAGNOSIS — Z9181 History of falling: Secondary | ICD-10-CM | POA: Diagnosis not present

## 2018-11-22 DIAGNOSIS — M1990 Unspecified osteoarthritis, unspecified site: Secondary | ICD-10-CM | POA: Diagnosis not present

## 2018-11-22 DIAGNOSIS — C8330 Diffuse large B-cell lymphoma, unspecified site: Secondary | ICD-10-CM | POA: Diagnosis not present

## 2018-11-22 DIAGNOSIS — K5900 Constipation, unspecified: Secondary | ICD-10-CM | POA: Diagnosis not present

## 2018-11-22 DIAGNOSIS — K219 Gastro-esophageal reflux disease without esophagitis: Secondary | ICD-10-CM | POA: Diagnosis not present

## 2018-11-22 DIAGNOSIS — D631 Anemia in chronic kidney disease: Secondary | ICD-10-CM | POA: Diagnosis not present

## 2018-11-22 DIAGNOSIS — G2581 Restless legs syndrome: Secondary | ICD-10-CM | POA: Diagnosis not present

## 2018-11-23 ENCOUNTER — Inpatient Hospital Stay: Payer: Medicare Other

## 2018-11-23 ENCOUNTER — Inpatient Hospital Stay: Payer: Medicare Other | Attending: Nurse Practitioner

## 2018-11-23 ENCOUNTER — Telehealth: Payer: Self-pay | Admitting: Nurse Practitioner

## 2018-11-23 ENCOUNTER — Encounter: Payer: Self-pay | Admitting: Nurse Practitioner

## 2018-11-23 ENCOUNTER — Inpatient Hospital Stay (HOSPITAL_BASED_OUTPATIENT_CLINIC_OR_DEPARTMENT_OTHER): Payer: Medicare Other | Admitting: Nurse Practitioner

## 2018-11-23 VITALS — BP 165/88 | HR 85 | Temp 98.2°F | Resp 18

## 2018-11-23 VITALS — BP 85/60 | HR 104 | Temp 97.6°F | Resp 18 | Ht 63.0 in | Wt 145.4 lb

## 2018-11-23 DIAGNOSIS — I129 Hypertensive chronic kidney disease with stage 1 through stage 4 chronic kidney disease, or unspecified chronic kidney disease: Secondary | ICD-10-CM

## 2018-11-23 DIAGNOSIS — Z7689 Persons encountering health services in other specified circumstances: Secondary | ICD-10-CM | POA: Diagnosis not present

## 2018-11-23 DIAGNOSIS — Z85828 Personal history of other malignant neoplasm of skin: Secondary | ICD-10-CM | POA: Insufficient documentation

## 2018-11-23 DIAGNOSIS — C8580 Other specified types of non-Hodgkin lymphoma, unspecified site: Secondary | ICD-10-CM

## 2018-11-23 DIAGNOSIS — N189 Chronic kidney disease, unspecified: Secondary | ICD-10-CM | POA: Insufficient documentation

## 2018-11-23 DIAGNOSIS — D631 Anemia in chronic kidney disease: Secondary | ICD-10-CM | POA: Diagnosis not present

## 2018-11-23 DIAGNOSIS — Z79899 Other long term (current) drug therapy: Secondary | ICD-10-CM | POA: Insufficient documentation

## 2018-11-23 DIAGNOSIS — C8339 Diffuse large B-cell lymphoma, extranodal and solid organ sites: Secondary | ICD-10-CM

## 2018-11-23 DIAGNOSIS — C8330 Diffuse large B-cell lymphoma, unspecified site: Secondary | ICD-10-CM

## 2018-11-23 DIAGNOSIS — H409 Unspecified glaucoma: Secondary | ICD-10-CM | POA: Insufficient documentation

## 2018-11-23 DIAGNOSIS — Z5112 Encounter for antineoplastic immunotherapy: Secondary | ICD-10-CM | POA: Diagnosis not present

## 2018-11-23 LAB — CMP (CANCER CENTER ONLY)
ALT: 8 U/L (ref 0–44)
AST: 17 U/L (ref 15–41)
Albumin: 3 g/dL — ABNORMAL LOW (ref 3.5–5.0)
Alkaline Phosphatase: 93 U/L (ref 38–126)
Anion gap: 9 (ref 5–15)
BUN: 11 mg/dL (ref 8–23)
CO2: 28 mmol/L (ref 22–32)
Calcium: 9.1 mg/dL (ref 8.9–10.3)
Chloride: 107 mmol/L (ref 98–111)
Creatinine: 0.85 mg/dL (ref 0.44–1.00)
GFR, Estimated: >60 mL/min (ref >60)
Glucose, Bld: 127 mg/dL — ABNORMAL HIGH (ref 70–99)
Potassium: 3.7 mmol/L (ref 3.5–5.1)
Sodium: 144 mmol/L (ref 135–145)
Total Bilirubin: 0.5 mg/dL (ref 0.3–1.2)
Total Protein: 5.6 g/dL — ABNORMAL LOW (ref 6.5–8.1)

## 2018-11-23 LAB — CBC WITH DIFFERENTIAL (CANCER CENTER ONLY)
Abs Immature Granulocytes: 0.01 10*3/uL (ref 0.00–0.07)
BASOS PCT: 1 %
Basophils Absolute: 0 10*3/uL (ref 0.0–0.1)
Eosinophils Absolute: 0.1 10*3/uL (ref 0.0–0.5)
Eosinophils Relative: 1 %
HCT: 28.2 % — ABNORMAL LOW (ref 36.0–46.0)
Hemoglobin: 9.3 g/dL — ABNORMAL LOW (ref 12.0–15.0)
Immature Granulocytes: 0 %
LYMPHS ABS: 0.5 10*3/uL — AB (ref 0.7–4.0)
Lymphocytes Relative: 12 %
MCH: 33.7 pg (ref 26.0–34.0)
MCHC: 33 g/dL (ref 30.0–36.0)
MCV: 102.2 fL — ABNORMAL HIGH (ref 80.0–100.0)
Monocytes Absolute: 0.6 10*3/uL (ref 0.1–1.0)
Monocytes Relative: 14 %
Neutro Abs: 2.8 10*3/uL (ref 1.7–7.7)
Neutrophils Relative %: 72 %
Platelet Count: 241 10*3/uL (ref 150–400)
RBC: 2.76 MIL/uL — ABNORMAL LOW (ref 3.87–5.11)
RDW: 16.4 % — ABNORMAL HIGH (ref 11.5–15.5)
WBC: 3.9 10*3/uL — AB (ref 4.0–10.5)
nRBC: 0 % (ref 0.0–0.2)

## 2018-11-23 LAB — LACTATE DEHYDROGENASE: LDH: 161 U/L (ref 98–192)

## 2018-11-23 MED ORDER — SODIUM CHLORIDE 0.9 % IV SOLN
600.0000 mg/m2 | Freq: Once | INTRAVENOUS | Status: AC
  Administered 2018-11-23: 1040 mg via INTRAVENOUS
  Filled 2018-11-23: qty 52

## 2018-11-23 MED ORDER — DIPHENHYDRAMINE HCL 25 MG PO CAPS
ORAL_CAPSULE | ORAL | Status: AC
  Filled 2018-11-23: qty 1

## 2018-11-23 MED ORDER — DEXAMETHASONE SODIUM PHOSPHATE 10 MG/ML IJ SOLN
10.0000 mg | Freq: Once | INTRAMUSCULAR | Status: AC
  Administered 2018-11-23: 10 mg via INTRAVENOUS

## 2018-11-23 MED ORDER — SODIUM CHLORIDE 0.9% FLUSH
10.0000 mL | Freq: Once | INTRAVENOUS | Status: AC
  Administered 2018-11-23: 10 mL
  Filled 2018-11-23: qty 10

## 2018-11-23 MED ORDER — ACETAMINOPHEN 325 MG PO TABS
ORAL_TABLET | ORAL | Status: AC
  Filled 2018-11-23: qty 2

## 2018-11-23 MED ORDER — PALONOSETRON HCL INJECTION 0.25 MG/5ML
INTRAVENOUS | Status: AC
  Filled 2018-11-23: qty 5

## 2018-11-23 MED ORDER — PALONOSETRON HCL INJECTION 0.25 MG/5ML
0.2500 mg | Freq: Once | INTRAVENOUS | Status: AC
  Administered 2018-11-23: 0.25 mg via INTRAVENOUS

## 2018-11-23 MED ORDER — PREDNISONE 20 MG PO TABS: ORAL_TABLET | ORAL | 0 refills | Status: DC

## 2018-11-23 MED ORDER — DIPHENHYDRAMINE HCL 25 MG PO CAPS
25.0000 mg | ORAL_CAPSULE | Freq: Once | ORAL | Status: AC
  Administered 2018-11-23: 25 mg via ORAL

## 2018-11-23 MED ORDER — HEPARIN SOD (PORK) LOCK FLUSH 100 UNIT/ML IV SOLN
250.0000 [IU] | Freq: Once | INTRAVENOUS | Status: AC
  Administered 2018-11-23: 250 [IU]
  Filled 2018-11-23: qty 5

## 2018-11-23 MED ORDER — SODIUM CHLORIDE 0.9 % IV SOLN
INTRAVENOUS | Status: AC
  Administered 2018-11-23: 11:00:00 via INTRAVENOUS
  Filled 2018-11-23 (×2): qty 250

## 2018-11-23 MED ORDER — SODIUM CHLORIDE 0.9 % IV SOLN
Freq: Once | INTRAVENOUS | Status: AC
  Administered 2018-11-23: 12:00:00 via INTRAVENOUS
  Filled 2018-11-23: qty 250

## 2018-11-23 MED ORDER — SODIUM CHLORIDE 0.9 % IV SOLN
375.0000 mg/m2 | Freq: Once | INTRAVENOUS | Status: AC
  Administered 2018-11-23: 700 mg via INTRAVENOUS
  Filled 2018-11-23: qty 20

## 2018-11-23 MED ORDER — DEXAMETHASONE SODIUM PHOSPHATE 10 MG/ML IJ SOLN
INTRAMUSCULAR | Status: AC
  Filled 2018-11-23: qty 1

## 2018-11-23 MED ORDER — SODIUM CHLORIDE 0.9% FLUSH
10.0000 mL | INTRAVENOUS | Status: DC | PRN
  Filled 2018-11-23: qty 10

## 2018-11-23 MED ORDER — ACETAMINOPHEN 325 MG PO TABS
650.0000 mg | ORAL_TABLET | Freq: Once | ORAL | Status: AC
  Administered 2018-11-23: 650 mg via ORAL

## 2018-11-23 NOTE — Telephone Encounter (Signed)
Printed calendar and avs. °

## 2018-11-23 NOTE — Progress Notes (Signed)
Completed orthostatic BP on [name] as requested by Ned Card, NP. See vitals flowsheet for data. Ned Card, NP aware of orthostatic values. Pt does state she does not feel as dizzy when standing after receiving 500 mL of NS. Will continue to follow pt.   PICC line removed from patient upon completion of infusion therapy.  PICC line removed by Carolanne Grumbling, RN at [phone] measuring 38cm. Pressure was held for 5 minutes until 1508. Pt educated on care of limb and given print out with education on it.  Pt layed flat for 30 minutes post PICC line removal. Upon leaving unit at 1546, VSS, dressing was clean and intact, no pain reported at site.

## 2018-11-23 NOTE — Progress Notes (Addendum)
[place] OFFICE PROGRESS NOTE   Diagnosis: Non-Hodgkin's lymphoma  INTERVAL HISTORY:   Ms. Deeds returns as scheduled.  She completed cycle 5 CVP/Rituxan 11/01/2018.  She has periodic nausea.  No mouth sores.  No diarrhea.  She has had constipation over the past 2 weeks.  She is taking grape juice with fiber and also prune juice.  She notes that the feet "tingle and burn" intermittently.  This predates chemotherapy.  She thinks her blood pressure is dropping when she stands up.  She has intermittent lightheadedness/dizziness with position change.  Objective:  Vital signs in last 24 hours:  Blood pressure (!) 147/83, pulse 99, temperature 97.6 F (36.4 C), temperature source Oral, resp. rate 18, height 5' 3"  (1.6 m), weight 145 lb 6.4 oz (66 kg), SpO2 100 %.    HEENT: No thrush or ulcers. Lymphatics: No palpable cervical, supraclavicular or axillary lymph nodes. Resp: Lungs clear bilaterally. Cardio: Regular rate and rhythm. GI: Abdomen soft and nontender.  No hepatosplenomegaly. Vascular: No leg edema. Right upper extremity PICC without erythema.   Lab Results:  Lab Results  Component Value Date   WBC 3.9 (L) 11/23/2018   HGB 9.3 (L) 11/23/2018   HCT 28.2 (L) 11/23/2018   MCV 102.2 (H) 11/23/2018   PLT 241 11/23/2018   NEUTROABS 2.8 11/23/2018    Imaging:  No results found.  Medications: I have reviewed the patient's current medications.  Assessment/Plan: 1. 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 Loss of MLH1 and PMS2, MSI-high, BRAF mutation detected ? Colonoscopy February 2017 ? Colonoscopy 12/16/2017-3 mm polyp in the transverse colon, 10 mm polyp in the sigmoid colon. Diverticulosis in the sigmoid colon. Transverse colon polyp, tubular adenoma; sigmoid colon polyp, tubular adenoma. ? CT 07/12/2018-probable subcutaneous metastasis at the left abdominal wall,  left perinephric mass, destructive thoracic/lumbar paraspinous mass ? CT-guided biopsy of the paraspinous mass 07/13/2018  2. Anemia-likely secondary to bleeding from the colon cancer and surgery. Hemoglobin in normal range 03/20/2016, mild persistent anemia 3. Ascending colon "polyps" removed on the colonoscopy 12/22/2014 with the pathology revealing poorly differentiated adenocarcinoma and a sessile serrated adenoma polyp, similar to the pathology on the hepatic flexure mass 4. History of multiple skin cancers. Seen by dermatology every 3 months. 5. Glaucoma 6.Non-Hodgkin's lymphoma presenting with severe back pain  CTabdomen/pelvis7/30/2019-large destructive right paraspinal chest wall mass lower thoracic/upper lumbar spinewith associated bone destruction involving the right posterior elements of T9, T10 and T11, spinous processes of T8 and T9, and the posterior right 10th and 11th ribs. Probable subcutaneous metastasis anterior left abdominal wall 2.2 cm; enlarging mass in the left perinephric space 3.0 cm in size.  CT L-spine 07/12/2018-paraspinous soft tissue mass on the right at the lower thoracic spine causing destruction of the right T11 and 12 transverse processes; may encroach on the right T11 neural foramen.  CT-guided biopsy paraspinous mass 07/13/2018-poorly differentiated malignant neoplasm; CD45, CD20,bcl-6 andbcl-2positive. 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  Radiationto paraspinous massinitiated 07/14/2018  Cycle 1 CVP/Rituxan 08/02/2018 08/29/2018-decreased size of right paraspinal soft tissue mass,  PET scan 8/52/7782-[id number] mild metabolic activity within the right paraspinal mass which is decreased significantly in size from the comparison exam. No evidence of lymphoma elsewhere. Focal activity within the distal esophagus above the GE junction without mass lesion or obstruction.  Cycle 2 CVP/Rituxan  08/22/2018,udenyca added  CT abdomen/pelvis-decrease in size of the right paraspinal  soft tissue mass with adjacent lytic abnormality in the right posterior 10th and 11th ribs.  Cycle 3 CVP/Rituxan 09/12/2018-Cytoxan dose reduced  Cycle 4 CVP/rituximab 10/03/2018  Cycle 5 CVP/rituximab 11/01/2018  Cycle 6 CVP/Rituxan 11/23/2018 7.Anorexia/weight loss 8.Mild elevation of calcium-questionhypercalcemia of malignancy 9.History of severe neutropeniaand anemiasecondary to radiation and chemotherapy; udenyca added with cycle 2CVP/RituxanCytoxan dose reduced with cycle 3  10. Admission 08/29/2018 with failure to thrive and severe anemia/neutropenia, status post red cell transfusions on 08/30/2018  Disposition: Ms. Quintela appears unchanged.  She has completed 5 cycles of CVP/Rituxan.  Plan to proceed with cycle 6 today as scheduled the exception of vincristine which will be held due to orthostatic hypotension of unclear etiology.  She will receive Udenyca tomorrow.  We reviewed the CBC from today.  Counts are adequate for treatment.  The etiology of the orthostatic hypotension is unclear.  As noted above we will hold vincristine with this treatment.  She is on Florinef per Dr. Shelia Media.  We will forward today's blood pressure readings to his office.  She will receive a 500 cc normal saline fluid bolus today with repeat vital signs.  She will undergo restaging CTs in the next 3 to 4 weeks.  We will see her in follow-up a few days later to review the results.  PICC line will be removed today following treatment.  Patient seen with Dr. Benay Spice.  25 minutes were spent face-to-face at today's visit with the majority of that time involved in counseling/coordination of care.  Ned Card ANP/GNP-BC   11/23/2018  9:41 AM This was a shared visit with Ned Card.  Ms. Dymond appears well, but she has persistent orthostatic hypotension.  This could be related to vincristine.  We will hold  vincristine with treatment today.  She will receive a fluid bolus and repeat orthostatic vital signs today.  Julieanne Manson, MD

## 2018-11-23 NOTE — Patient Instructions (Addendum)
PICC Removal, Care After Refer to this sheet in the next few weeks. These instructions provide you with information on caring for yourself after your procedure. Your health care provider may also give you more specific instructions. Your treatment has been planned according to current medical practices, but problems sometimes occur. Call your health care provider if you have any problems or questions after your procedure. What can I expect after the procedure? After your procedure, it is typical to have mild discomfort at the insertion site. This should not last for more than a day. Follow these instructions at home: You may remove the bandage after 24 hours. The PICC insertion site is very small. A small scab may develop over the insertion site. It is okay to wash the site gently with soap and water. Be careful not to remove or pick off the scab. Gently pat the site dry after washing it. You do not need to put another bandage over the insertion site. Do not lift anything heavy or do strenuous physical activity for 24 hours after the PICC is removed. This includes:  Weight lifting.  Strenuous yard work.  Any physical activity with repetitive arm movement.  Contact a health care provider if:  You have swelling or puffiness in your arm at the PICC insertion site.  You have increasing tenderness at the PICC insertion site. Get help right away if:  You have numbness or tingling in your fingers, hand, or arm.  Your arm looks blue and feels cold.  You have redness around the insertion site or a red streak goes up your arm.  You have any type of drainage from the PICC insertion site. This includes drainage such as: ? Bleeding from the insertion site. If this happens, apply firm, direct pressure to the PICC insertion site with a clean towel. ? Drainage that is yellow or tan.  You have a fever. This information is not intended to replace advice given to you by your health care provider. Make  sure you discuss any questions you have with your health care provider. Document Released: 12/05/2013 Document Revised: 05/07/2016 Document Reviewed: 09/22/2013 Elsevier Interactive Patient Education  2017 [place] Discharge Instructions for Patients Receiving Chemotherapy  Today you received the following chemotherapy agents Cytoxan and Rituxan.  To help prevent nausea and vomiting after your treatment, we encourage you to take your nausea medication as directed.    If you develop nausea and vomiting that is not controlled by your nausea medication, call the clinic.   BELOW ARE SYMPTOMS THAT SHOULD BE REPORTED IMMEDIATELY:  *FEVER GREATER THAN 100.5 F  *CHILLS WITH OR WITHOUT FEVER  NAUSEA AND VOMITING THAT IS NOT CONTROLLED WITH YOUR NAUSEA MEDICATION  *UNUSUAL SHORTNESS OF BREATH  *UNUSUAL BRUISING OR BLEEDING  TENDERNESS IN MOUTH AND THROAT WITH OR WITHOUT PRESENCE OF ULCERS  *URINARY PROBLEMS  *BOWEL PROBLEMS  UNUSUAL RASH Items with * indicate a potential emergency and should be followed up as soon as possible.  Feel free to call the clinic should you have any questions or concerns. The clinic phone number is [phone].  Please show the [facility] at check-in to the Emergency Department and triage nurse.   Dehydration, Adult Dehydration is when there is not enough fluid or water in your body. This happens when you lose more fluids than you take in. Dehydration can range from mild to very bad. It should be treated right away to keep it from getting  very bad. Symptoms of mild dehydration may include:  Thirst.  Dry lips.  Slightly dry mouth.  Dry, warm skin.  Dizziness. Symptoms of moderate dehydration may include:  Very dry mouth.  Muscle cramps.  Dark pee (urine). Pee may be the color of tea.  Your body making less pee.  Your eyes making fewer tears.  Heartbeat that is uneven or faster than normal  (palpitations).  Headache.  Light-headedness, especially when you stand up from sitting.  Fainting (syncope). Symptoms of very bad dehydration may include:  Changes in skin, such as: ? Cold and clammy skin. ? Blotchy (mottled) or pale skin. ? Skin that does not quickly return to normal after being lightly pinched and let go (poor skin turgor).  Changes in body fluids, such as: ? Feeling very thirsty. ? Your eyes making fewer tears. ? Not sweating when body temperature is high, such as in hot weather. ? Your body making very little pee.  Changes in vital signs, such as: ? Weak pulse. ? Pulse that is more than 100 beats a minute when you are sitting still. ? Fast breathing. ? Low blood pressure.  Other changes, such as: ? Sunken eyes. ? Cold hands and feet. ? Confusion. ? Lack of energy (lethargy). ? Trouble waking up from sleep. ? Short-term weight loss. ? Unconsciousness. Follow these instructions at home:  If told by your doctor, drink an ORS: ? Make an ORS by using instructions on the package. ? Start by drinking small amounts, about  cup (120 mL) every 5-10 minutes. ? Slowly drink more until you have had the amount that your doctor said to have.  Drink enough clear fluid to keep your pee clear or pale yellow. If you were told to drink an ORS, finish the ORS first, then start slowly drinking clear fluids. Drink fluids such as: ? Water. Do not drink only water by itself. Doing that can make the salt (sodium) level in your body get too low (hyponatremia). ? Ice chips. ? Fruit juice that you have added water to (diluted). ? Low-calorie sports drinks.  Avoid: ? Alcohol. ? Drinks that have a lot of sugar. These include high-calorie sports drinks, fruit juice that does not have water added, and soda. ? Caffeine. ? Foods that are greasy or have a lot of fat or sugar.  Take over-the-counter and prescription medicines only as told by your doctor.  Do not take salt  tablets. Doing that can make the salt level in your body get too high (hypernatremia).  Eat foods that have minerals (electrolytes). Examples include bananas, oranges, potatoes, tomatoes, and spinach.  Keep all follow-up visits as told by your doctor. This is important. Contact a doctor if:  You have belly (abdominal) pain that: ? Gets worse. ? Stays in one area (localizes).  You have a rash.  You have a stiff neck.  You get angry or annoyed more easily than normal (irritability).  You are more sleepy than normal.  You have a harder time waking up than normal.  You feel: ? Weak. ? Dizzy. ? Very thirsty.  You have peed (urinated) only a small amount of very dark pee during 6-8 hours. Get help right away if:  You have symptoms of very bad dehydration.  You cannot drink fluids without throwing up (vomiting).  Your symptoms get worse with treatment.  You have a fever.  You have a very bad headache.  You are throwing up or having watery poop (diarrhea) and it: ?  Gets worse. ? Does not go away.  You have blood or something green (bile) in your throw-up.  You have blood in your poop (stool). This may cause poop to look black and tarry.  You have not peed in 6-8 hours.  You pass out (faint).  Your heart rate when you are sitting still is more than 100 beats a minute.  You have trouble breathing. This information is not intended to replace advice given to you by your health care provider. Make sure you discuss any questions you have with your health care provider. Document Released: 09/26/2009 Document Revised: 06/19/2016 Document Reviewed: 01/24/2016 Elsevier Interactive Patient Education  2018 [employer].

## 2018-11-24 ENCOUNTER — Inpatient Hospital Stay: Payer: Medicare Other

## 2018-11-24 ENCOUNTER — Other Ambulatory Visit: Payer: Self-pay | Admitting: *Deleted

## 2018-11-24 DIAGNOSIS — H409 Unspecified glaucoma: Secondary | ICD-10-CM | POA: Diagnosis not present

## 2018-11-24 DIAGNOSIS — D631 Anemia in chronic kidney disease: Secondary | ICD-10-CM | POA: Diagnosis not present

## 2018-11-24 DIAGNOSIS — Z85828 Personal history of other malignant neoplasm of skin: Secondary | ICD-10-CM | POA: Diagnosis not present

## 2018-11-24 DIAGNOSIS — Z5112 Encounter for antineoplastic immunotherapy: Secondary | ICD-10-CM | POA: Diagnosis not present

## 2018-11-24 DIAGNOSIS — C8580 Other specified types of non-Hodgkin lymphoma, unspecified site: Secondary | ICD-10-CM

## 2018-11-24 DIAGNOSIS — N189 Chronic kidney disease, unspecified: Secondary | ICD-10-CM | POA: Diagnosis not present

## 2018-11-24 DIAGNOSIS — C8339 Diffuse large B-cell lymphoma, extranodal and solid organ sites: Secondary | ICD-10-CM | POA: Diagnosis not present

## 2018-11-24 DIAGNOSIS — I129 Hypertensive chronic kidney disease with stage 1 through stage 4 chronic kidney disease, or unspecified chronic kidney disease: Secondary | ICD-10-CM | POA: Diagnosis not present

## 2018-11-24 DIAGNOSIS — Z79899 Other long term (current) drug therapy: Secondary | ICD-10-CM | POA: Diagnosis not present

## 2018-11-24 DIAGNOSIS — Z7689 Persons encountering health services in other specified circumstances: Secondary | ICD-10-CM | POA: Diagnosis not present

## 2018-11-24 MED ORDER — PEGFILGRASTIM INJECTION 6 MG/0.6ML ~~LOC~~
PREFILLED_SYRINGE | SUBCUTANEOUS | Status: AC
  Filled 2018-11-24: qty 0.6

## 2018-11-24 MED ORDER — PEGFILGRASTIM INJECTION 6 MG/0.6ML ~~LOC~~
6.0000 mg | PREFILLED_SYRINGE | Freq: Once | SUBCUTANEOUS | Status: AC
  Administered 2018-11-24: 6 mg via SUBCUTANEOUS

## 2018-11-24 MED ORDER — POTASSIUM CHLORIDE ER 10 MEQ PO TBCR: 10.0000 meq | EXTENDED_RELEASE_TABLET | Freq: Two times a day (BID) | ORAL | 1 refills | Status: DC

## 2018-12-23 ENCOUNTER — Ambulatory Visit (HOSPITAL_COMMUNITY)
Admission: RE | Admit: 2018-12-23 | Discharge: 2018-12-23 | Disposition: A | Discharge status: Home / Self Care | Payer: Medicare Other | Source: Ambulatory Visit | Attending: Nurse Practitioner | Admitting: Nurse Practitioner

## 2018-12-23 ENCOUNTER — Encounter (HOSPITAL_COMMUNITY): Payer: Self-pay

## 2018-12-23 ENCOUNTER — Inpatient Hospital Stay: Payer: Medicare Other | Attending: Nurse Practitioner

## 2018-12-23 DIAGNOSIS — D649 Anemia, unspecified: Secondary | ICD-10-CM | POA: Insufficient documentation

## 2018-12-23 DIAGNOSIS — Z9221 Personal history of antineoplastic chemotherapy: Secondary | ICD-10-CM | POA: Insufficient documentation

## 2018-12-23 DIAGNOSIS — Z85828 Personal history of other malignant neoplasm of skin: Secondary | ICD-10-CM | POA: Insufficient documentation

## 2018-12-23 DIAGNOSIS — Z923 Personal history of irradiation: Secondary | ICD-10-CM | POA: Insufficient documentation

## 2018-12-23 DIAGNOSIS — C8330 Diffuse large B-cell lymphoma, unspecified site: Secondary | ICD-10-CM

## 2018-12-23 DIAGNOSIS — C8590 Non-Hodgkin lymphoma, unspecified, unspecified site: Secondary | ICD-10-CM | POA: Insufficient documentation

## 2018-12-23 DIAGNOSIS — Z9049 Acquired absence of other specified parts of digestive tract: Secondary | ICD-10-CM | POA: Diagnosis not present

## 2018-12-23 DIAGNOSIS — Z85038 Personal history of other malignant neoplasm of large intestine: Secondary | ICD-10-CM | POA: Diagnosis not present

## 2018-12-23 LAB — CMP (CANCER CENTER ONLY)
ALT: 10 U/L (ref 0–44)
AST: 18 U/L (ref 15–41)
Albumin: 3.2 g/dL — ABNORMAL LOW (ref 3.5–5.0)
Alkaline Phosphatase: 98 U/L (ref 38–126)
Anion gap: 9 (ref 5–15)
BUN: 13 mg/dL (ref 8–23)
CO2: 28 mmol/L (ref 22–32)
Calcium: 8.9 mg/dL (ref 8.9–10.3)
Chloride: 107 mmol/L (ref 98–111)
Creatinine: 0.8 mg/dL (ref 0.44–1.00)
GFR, Est AFR Am: >60 mL/min (ref >60)
GFR, Estimated: >60 mL/min (ref >60)
Glucose, Bld: 87 mg/dL (ref 70–99)
Potassium: 3.5 mmol/L (ref 3.5–5.1)
Sodium: 144 mmol/L (ref 135–145)
Total Bilirubin: 0.7 mg/dL (ref 0.3–1.2)
Total Protein: 5.6 g/dL — ABNORMAL LOW (ref 6.5–8.1)

## 2018-12-23 LAB — CBC WITH DIFFERENTIAL (CANCER CENTER ONLY)
Abs Immature Granulocytes: 0.01 10*3/uL (ref 0.00–0.07)
Basophils Absolute: 0 10*3/uL (ref 0.0–0.1)
Basophils Relative: 1 %
Eosinophils Absolute: 0.1 10*3/uL (ref 0.0–0.5)
Eosinophils Relative: 3 %
HCT: 31 % — ABNORMAL LOW (ref 36.0–46.0)
HEMOGLOBIN: 10.4 g/dL — AB (ref 12.0–15.0)
Immature Granulocytes: 0 %
Lymphocytes Relative: 14 %
Lymphs Abs: 0.6 10*3/uL — ABNORMAL LOW (ref 0.7–4.0)
MCH: 34.7 pg — AB (ref 26.0–34.0)
MCHC: 33.5 g/dL (ref 30.0–36.0)
MCV: 103.3 fL — ABNORMAL HIGH (ref 80.0–100.0)
MONO ABS: 0.5 10*3/uL (ref 0.1–1.0)
MONOS PCT: 13 %
Neutro Abs: 2.7 10*3/uL (ref 1.7–7.7)
Neutrophils Relative %: 69 %
Platelet Count: 217 10*3/uL (ref 150–400)
RBC: 3 MIL/uL — ABNORMAL LOW (ref 3.87–5.11)
RDW: 15.1 % (ref 11.5–15.5)
WBC Count: 3.9 10*3/uL — ABNORMAL LOW (ref 4.0–10.5)
nRBC: 0 % (ref 0.0–0.2)

## 2018-12-23 LAB — LACTATE DEHYDROGENASE: LDH: 170 U/L (ref 98–192)

## 2018-12-23 MED ORDER — IOHEXOL 300 MG/ML  SOLN
100.0000 mL | Freq: Once | INTRAMUSCULAR | Status: AC | PRN
  Administered 2018-12-23: 100 mL via INTRAVENOUS

## 2018-12-23 MED ORDER — SODIUM CHLORIDE (PF) 0.9 % IJ SOLN
INTRAMUSCULAR | Status: AC
  Filled 2018-12-23: qty 50

## 2018-12-26 ENCOUNTER — Inpatient Hospital Stay (HOSPITAL_BASED_OUTPATIENT_CLINIC_OR_DEPARTMENT_OTHER): Payer: Medicare Other | Admitting: Oncology

## 2018-12-26 ENCOUNTER — Telehealth: Payer: Self-pay | Admitting: Oncology

## 2018-12-26 VITALS — BP 132/69 | HR 84 | Temp 98.2°F | Resp 18 | Ht 63.0 in | Wt 156.0 lb

## 2018-12-26 DIAGNOSIS — Z85828 Personal history of other malignant neoplasm of skin: Secondary | ICD-10-CM | POA: Diagnosis not present

## 2018-12-26 DIAGNOSIS — Z9049 Acquired absence of other specified parts of digestive tract: Secondary | ICD-10-CM

## 2018-12-26 DIAGNOSIS — D649 Anemia, unspecified: Secondary | ICD-10-CM

## 2018-12-26 DIAGNOSIS — C8580 Other specified types of non-Hodgkin lymphoma, unspecified site: Secondary | ICD-10-CM | POA: Diagnosis not present

## 2018-12-26 DIAGNOSIS — C8590 Non-Hodgkin lymphoma, unspecified, unspecified site: Secondary | ICD-10-CM | POA: Diagnosis not present

## 2018-12-26 DIAGNOSIS — Z85038 Personal history of other malignant neoplasm of large intestine: Secondary | ICD-10-CM

## 2018-12-26 DIAGNOSIS — Z923 Personal history of irradiation: Secondary | ICD-10-CM

## 2018-12-26 DIAGNOSIS — Z9221 Personal history of antineoplastic chemotherapy: Secondary | ICD-10-CM

## 2018-12-26 NOTE — Progress Notes (Signed)
[place] OFFICE PROGRESS NOTE   Diagnosis: Non-Hodgkin's lymphoma  INTERVAL HISTORY:   Erica Escobar returns as scheduled.  She completed cycle 6 of CVP-rituximab on 11/23/2018.  She reports tolerating the treatment well.  No symptom of an allergic reaction.  No nausea or vomiting.  She continues to have pain at the lower back.  The pain remains improved compared to when she was diagnosed with lymphoma.  She takes Tylenol for relief of pain.  She is ambulating.  No further falls.  Objective:  Vital signs in last 24 hours:  Blood pressure 132/69, pulse 84, temperature 98.2 F (36.8 C), temperature source Oral, resp. rate 18, height _0  (1.6 m), weight 156 lb (70.8 kg), SpO2 96 %.    HEENT: No thrush or ulcers Lymphatics: No cervical, supraclavicular, axillary, or inguinal nodes Resp: Lungs clear bilaterally, no respiratory distress Cardio: Regular rate and rhythm GI: No hepatosplenomegaly Vascular: 1+ pitting edema at the right greater than left lower leg, no erythema or tenderness Musculoskeletal: Tender at the right lower back    Lab Results:  Lab Results  Component Value Date   WBC 3.9 (L) 12/23/2018   HGB 10.4 (L) 12/23/2018   HCT 31.0 (L) 12/23/2018   MCV 103.3 (H) 12/23/2018   PLT 217 12/23/2018   NEUTROABS 2.7 12/23/2018    CMP  Lab Results  Component Value Date   NA 144 12/23/2018   K 3.5 12/23/2018   CL 107 12/23/2018   CO2 28 12/23/2018   GLUCOSE 87 12/23/2018   BUN 13 12/23/2018   CREATININE 0.80 12/23/2018   CALCIUM 8.9 12/23/2018   PROT 5.6 (L) 12/23/2018   ALBUMIN 3.2 (L) 12/23/2018   AST 18 12/23/2018   ALT 10 12/23/2018   ALKPHOS 98 12/23/2018   BILITOT 0.7 12/23/2018   GFRNONAA >60 12/23/2018   GFRAA >60 12/23/2018    Lab Results  Component Value Date   CEA1 2.2 07/12/2018    Imaging:  Ct Chest W Contrast  Result Date: 12/23/2018 CLINICAL DATA:  Non-Hodgkin's lymphoma restaging EXAM: CT CHEST, ABDOMEN, AND PELVIS  WITH CONTRAST TECHNIQUE: Multidetector CT imaging of the chest, abdomen and pelvis was performed following the standard protocol during bolus administration of intravenous contrast. CONTRAST:  162m OMNIPAQUE IOHEXOL 300 MG/ML  SOLN COMPARISON:  Multiple exams, including 08/29/2018 and PET-CT from 08/10/2018 FINDINGS: CT CHEST FINDINGS Cardiovascular: Descending thoracic aortic atherosclerotic calcification. Mild cardiomegaly. Aortic valve calcification. Mediastinum/Nodes: Mild circumferential wall thickening in the distal esophagus, esophagitis is not excluded. No appreciable adenopathy. Lungs/Pleura: Small right pleural effusion. There is associated passive atelectasis along with mild subsegmental atelectasis in the right middle lobe. Volume loss and indistinct airspace opacity medially in the left lower lobe also most compatible with atelectasis given the bronchovascular crowding. Musculoskeletal: Thoracic spondylosis. Moth-eaten appearance of the right tenth rib medially for example on images 39-40 of series 2, with slightly more sclerosis than on the 08/29/2018 exam. Bony destructive findings of the right tenth transverse process, as before. Fracture the right eleventh rib posteromedially noted. Soft tissue density in the right tenth intercostal space medially, probably most reliably measured in its short axis transverse measurement at 1.9 cm 2.6 cm, previously 3.2 cm. I can not exclude tumor extension into the right T10-11 neural foramen, as on prior. Overall the soft tissue tumor appears subjectively mildly less prominent in this vicinity. CT ABDOMEN PELVIS FINDINGS Hepatobiliary: Cholecystectomy. Common bile duct approximately 0.9 cm, previously about 0.7 cm in diameter. No intrahepatic biliary dilatation.  No focal liver lesions. Pancreas: Unremarkable Spleen: Punctate calcification in the upper margin of the spleen compatible with old granulomatous disease. Adrenals/Urinary Tract: Unremarkable  Stomach/Bowel: Right hemicolectomy. Sigmoid colon diverticulosis. There some localized wall thickening in the sigmoid colon proximally but no definite active diverticulitis at this time. Vascular/Lymphatic: Aortoiliac atherosclerotic vascular disease. Reproductive: Uterus absent.  Adnexa unremarkable. Other: No supplemental non-categorized findings. Musculoskeletal: Considerable lumbar spondylosis and degenerative disc disease. Dextroconvex lumbar scoliosis with rotary component. IMPRESSION: 1. Mildly reduced volume of abnormal soft tissue density in the right medial 10th intercostal/paraspinal space, associated with destructive findings of the right medial tenth rib and right tenth transverse process, and also associated with a previous fracture of the right eleventh rib. 2. Other imaging findings of potential clinical significance: Mild cardiomegaly. Aortic valve calcification. Aortic Atherosclerosis (ICD10-I70.0). Distal esophageal wall thickening favoring esophagitis. Small right pleural effusion. Mild bibasilar atelectasis. Thoracolumbar spondylosis and degenerative disc disease with dextroconvex lumbar scoliosis. Mildly dilated common bile duct, but possibly a physiologic response to cholecystectomy. Sigmoid colon diverticulosis. Electronically Signed   By: Van Clines M.D.   On: 12/23/2018 16:22   Ct Abdomen Pelvis W Contrast  Result Date: 12/23/2018 CLINICAL DATA:  Non-Hodgkin's lymphoma restaging EXAM: CT CHEST, ABDOMEN, AND PELVIS WITH CONTRAST TECHNIQUE: Multidetector CT imaging of the chest, abdomen and pelvis was performed following the standard protocol during bolus administration of intravenous contrast. CONTRAST:  110m OMNIPAQUE IOHEXOL 300 MG/ML  SOLN COMPARISON:  Multiple exams, including 08/29/2018 and PET-CT from 08/10/2018 FINDINGS: CT CHEST FINDINGS Cardiovascular: Descending thoracic aortic atherosclerotic calcification. Mild cardiomegaly. Aortic valve calcification.  Mediastinum/Nodes: Mild circumferential wall thickening in the distal esophagus, esophagitis is not excluded. No appreciable adenopathy. Lungs/Pleura: Small right pleural effusion. There is associated passive atelectasis along with mild subsegmental atelectasis in the right middle lobe. Volume loss and indistinct airspace opacity medially in the left lower lobe also most compatible with atelectasis given the bronchovascular crowding. Musculoskeletal: Thoracic spondylosis. Moth-eaten appearance of the right tenth rib medially for example on images 39-40 of series 2, with slightly more sclerosis than on the 08/29/2018 exam. Bony destructive findings of the right tenth transverse process, as before. Fracture the right eleventh rib posteromedially noted. Soft tissue density in the right tenth intercostal space medially, probably most reliably measured in its short axis transverse measurement at 1.9 cm 2.6 cm, previously 3.2 cm. I can not exclude tumor extension into the right T10-11 neural foramen, as on prior. Overall the soft tissue tumor appears subjectively mildly less prominent in this vicinity. CT ABDOMEN PELVIS FINDINGS Hepatobiliary: Cholecystectomy. Common bile duct approximately 0.9 cm, previously about 0.7 cm in diameter. No intrahepatic biliary dilatation. No focal liver lesions. Pancreas: Unremarkable Spleen: Punctate calcification in the upper margin of the spleen compatible with old granulomatous disease. Adrenals/Urinary Tract: Unremarkable Stomach/Bowel: Right hemicolectomy. Sigmoid colon diverticulosis. There some localized wall thickening in the sigmoid colon proximally but no definite active diverticulitis at this time. Vascular/Lymphatic: Aortoiliac atherosclerotic vascular disease. Reproductive: Uterus absent.  Adnexa unremarkable. Other: No supplemental non-categorized findings. Musculoskeletal: Considerable lumbar spondylosis and degenerative disc disease. Dextroconvex lumbar scoliosis with  rotary component. IMPRESSION: 1. Mildly reduced volume of abnormal soft tissue density in the right medial 10th intercostal/paraspinal space, associated with destructive findings of the right medial tenth rib and right tenth transverse process, and also associated with a previous fracture of the right eleventh rib. 2. Other imaging findings of potential clinical significance: Mild cardiomegaly. Aortic valve calcification. Aortic Atherosclerosis (ICD10-I70.0). Distal esophageal wall thickening favoring esophagitis. Small  right pleural effusion. Mild bibasilar atelectasis. Thoracolumbar spondylosis and degenerative disc disease with dextroconvex lumbar scoliosis. Mildly dilated common bile duct, but possibly a physiologic response to cholecystectomy. Sigmoid colon diverticulosis. Electronically Signed   By: Van Clines M.D.   On: 12/23/2018 16:22     Medications: I have reviewed the patient's current medications.  Assessment/Plan: 1. 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 Loss of MLH1 and PMS2, MSI-high, BRAF mutation detected ? Colonoscopy February 2017 ? Colonoscopy 12/16/2017-3 mm polyp in the transverse colon, 10 mm polyp in the sigmoid colon. Diverticulosis in the sigmoid colon. Transverse colon polyp, tubular adenoma; sigmoid colon polyp, tubular adenoma. ? CT 07/12/2018-probable subcutaneous metastasis at the left abdominal wall, left perinephric mass, destructive thoracic/lumbar paraspinous mass ? CT-guided biopsy of the paraspinous mass 07/13/2018  2. Anemia-likely secondary to bleeding from the colon cancer and surgery. Hemoglobin in normal range 03/20/2016, mild persistent anemia 3. Ascending colon "polyps" removed on the colonoscopy 12/22/2014 with the pathology revealing poorly differentiated adenocarcinoma and a sessile serrated adenoma polyp, similar to the pathology on the hepatic flexure  mass 4. History of multiple skin cancers. Seen by dermatology every 3 months. 5. Glaucoma 6.Non-Hodgkin's lymphoma presenting with severe back pain  CTabdomen/pelvis7/30/2019-large destructive right paraspinal chest wall mass lower thoracic/upper lumbar spinewith associated bone destruction involving the right posterior elements of T9, T10 and T11, spinous processes of T8 and T9, and the posterior right 10th and 11th ribs. Probable subcutaneous metastasis anterior left abdominal wall 2.2 cm; enlarging mass in the left perinephric space 3.0 cm in size.  CT L-spine 07/12/2018-paraspinous soft tissue mass on the right at the lower thoracic spine causing destruction of the right T11 and 12 transverse processes; may encroach on the right T11 neural foramen.  CT-guided biopsy paraspinous mass 07/13/2018-poorly differentiated malignant neoplasm; CD45, CD20,bcl-6 andbcl-2positive. 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  Radiationto paraspinous massinitiated 07/14/2018  Cycle 1 CVP/Rituxan 08/02/2018 08/29/2018-decreased size of right paraspinal soft tissue mass,  PET scan 5/82/5189-[id number] mild metabolic activity within the right paraspinal mass which is decreased significantly in size from the comparison exam. No evidence of lymphoma elsewhere. Focal activity within the distal esophagus above the GE junction without mass lesion or obstruction.  Cycle 2 CVP/Rituxan 08/22/2018,udenyca added  CT abdomen/pelvis-decrease in size of the right paraspinal soft tissue mass with adjacent lytic abnormality in the right posterior 10th and 11th ribs.  Cycle 3 CVP/Rituxan 09/12/2018-Cytoxan dose reduced  Cycle 4 CVP/rituximab 10/03/2018  Cycle 5 CVP/rituximab 11/01/2018  Cycle 6 CVP/Rituxan 11/23/2018  CTs 12/23/2018- mildly reduced volume of abnormal soft tissue at the medial right 10th rib/paraspinal space, destructive changes at the medial  right 10th rib and right 11th rib noted 7.Anorexia/weight loss 8.Mild elevation of calcium-questionhypercalcemia of malignancy 9.History of severe neutropeniaand anemiasecondary to radiation and chemotherapy; udenyca added with cycle 2CVP/RituxanCytoxan dose reduced with cycle 3  10. Admission 08/29/2018 with failure to thrive and severe anemia/neutropenia, status post red cell transfusions on 08/30/2018   Disposition: Erica Escobar is in clinical remission from non-Hodgkin's involvement.  Her performance status appears improved.  The persistent back pain is likely related to the previously noted rib fractures or degenerative changes.  She will contact us for increased pain.  The plan is to follow her with observation.  Erica Escobar will return for an office visit in 2 months.  Betsy Coder, MD  12/26/2018  4:32 PM

## 2018-12-26 NOTE — Telephone Encounter (Signed)
Gave patient avs report and appointments for March

## 2019-01-17 ENCOUNTER — Other Ambulatory Visit: Payer: Self-pay | Admitting: *Deleted

## 2019-01-17 DIAGNOSIS — C8330 Diffuse large B-cell lymphoma, unspecified site: Secondary | ICD-10-CM

## 2019-01-17 MED ORDER — POTASSIUM CHLORIDE ER 10 MEQ PO TBCR: 10.0000 meq | EXTENDED_RELEASE_TABLET | Freq: Two times a day (BID) | ORAL | 1 refills | Status: DC

## 2019-01-17 NOTE — Progress Notes (Signed)
Per Dr. Benay Spice: OK to refill K+ and will check Bmet at March visit.

## 2019-02-09 DIAGNOSIS — Z85828 Personal history of other malignant neoplasm of skin: Secondary | ICD-10-CM | POA: Diagnosis not present

## 2019-02-09 DIAGNOSIS — L570 Actinic keratosis: Secondary | ICD-10-CM | POA: Diagnosis not present

## 2019-02-09 DIAGNOSIS — D485 Neoplasm of uncertain behavior of skin: Secondary | ICD-10-CM | POA: Diagnosis not present

## 2019-02-09 DIAGNOSIS — L905 Scar conditions and fibrosis of skin: Secondary | ICD-10-CM | POA: Diagnosis not present

## 2019-02-09 DIAGNOSIS — D040 Carcinoma in situ of skin of lip: Secondary | ICD-10-CM | POA: Diagnosis not present

## 2019-02-09 DIAGNOSIS — D0462 Carcinoma in situ of skin of left upper limb, including shoulder: Secondary | ICD-10-CM | POA: Diagnosis not present

## 2019-02-09 DIAGNOSIS — L821 Other seborrheic keratosis: Secondary | ICD-10-CM | POA: Diagnosis not present

## 2019-02-09 DIAGNOSIS — D225 Melanocytic nevi of trunk: Secondary | ICD-10-CM | POA: Diagnosis not present

## 2019-02-11 ENCOUNTER — Other Ambulatory Visit: Payer: Self-pay | Admitting: Nurse Practitioner

## 2019-02-14 ENCOUNTER — Other Ambulatory Visit: Payer: Self-pay | Admitting: *Deleted

## 2019-02-17 DIAGNOSIS — L820 Inflamed seborrheic keratosis: Secondary | ICD-10-CM | POA: Diagnosis not present

## 2019-02-17 DIAGNOSIS — C4402 Squamous cell carcinoma of skin of lip: Secondary | ICD-10-CM | POA: Diagnosis not present

## 2019-02-20 ENCOUNTER — Inpatient Hospital Stay: Payer: Medicare Other

## 2019-02-20 ENCOUNTER — Inpatient Hospital Stay: Payer: Medicare Other | Admitting: Nurse Practitioner

## 2019-02-24 ENCOUNTER — Other Ambulatory Visit: Payer: Self-pay

## 2019-02-24 ENCOUNTER — Inpatient Hospital Stay (HOSPITAL_BASED_OUTPATIENT_CLINIC_OR_DEPARTMENT_OTHER): Payer: Medicare Other | Admitting: Nurse Practitioner

## 2019-02-24 ENCOUNTER — Telehealth: Payer: Self-pay | Admitting: Nurse Practitioner

## 2019-02-24 ENCOUNTER — Inpatient Hospital Stay: Payer: Medicare Other | Attending: Nurse Practitioner

## 2019-02-24 ENCOUNTER — Encounter: Payer: Self-pay | Admitting: Nurse Practitioner

## 2019-02-24 VITALS — BP 133/86 | HR 82 | Temp 98.2°F | Resp 18 | Ht 63.0 in | Wt 151.0 lb

## 2019-02-24 DIAGNOSIS — Z9221 Personal history of antineoplastic chemotherapy: Secondary | ICD-10-CM

## 2019-02-24 DIAGNOSIS — Z85828 Personal history of other malignant neoplasm of skin: Secondary | ICD-10-CM | POA: Diagnosis not present

## 2019-02-24 DIAGNOSIS — Z923 Personal history of irradiation: Secondary | ICD-10-CM | POA: Insufficient documentation

## 2019-02-24 DIAGNOSIS — Z85038 Personal history of other malignant neoplasm of large intestine: Secondary | ICD-10-CM

## 2019-02-24 DIAGNOSIS — Z79899 Other long term (current) drug therapy: Secondary | ICD-10-CM | POA: Insufficient documentation

## 2019-02-24 DIAGNOSIS — C8590 Non-Hodgkin lymphoma, unspecified, unspecified site: Secondary | ICD-10-CM | POA: Diagnosis not present

## 2019-02-24 DIAGNOSIS — Z9049 Acquired absence of other specified parts of digestive tract: Secondary | ICD-10-CM

## 2019-02-24 DIAGNOSIS — D649 Anemia, unspecified: Secondary | ICD-10-CM

## 2019-02-24 DIAGNOSIS — C8330 Diffuse large B-cell lymphoma, unspecified site: Secondary | ICD-10-CM

## 2019-02-24 LAB — BASIC METABOLIC PANEL - CANCER CENTER ONLY
Anion gap: 10 (ref 5–15)
BUN: 16 mg/dL (ref 8–23)
CHLORIDE: 108 mmol/L (ref 98–111)
CO2: 28 mmol/L (ref 22–32)
Calcium: 8.5 mg/dL — ABNORMAL LOW (ref 8.9–10.3)
Creatinine: 0.91 mg/dL (ref 0.44–1.00)
GFR, Est AFR Am: >60 mL/min (ref >60)
GFR, Estimated: 60 mL/min — ABNORMAL LOW (ref >60)
Glucose, Bld: 98 mg/dL (ref 70–99)
Potassium: 3.6 mmol/L (ref 3.5–5.1)
Sodium: 146 mmol/L — ABNORMAL HIGH (ref 135–145)

## 2019-02-24 NOTE — Telephone Encounter (Signed)
Scheduled appt per 3/13 los.  Printed calendar and avs.

## 2019-02-24 NOTE — Progress Notes (Addendum)
[facility] OFFICE PROGRESS NOTE   Diagnosis: Non-Hodgkin's lymphoma  INTERVAL HISTORY:   Ms. Wernert returns as scheduled.  She continues to have right mid to low back pain with activity.  The pain is improved as compared to prechemotherapy.  No fevers or sweats.  Overall good appetite.  Some constipation.  She is taking MiraLAX.  She reports having several skin cancers removed since her last visit.  Objective:  Vital signs in last 24 hours:  Blood pressure 133/86, pulse 82, temperature 98.2 F (36.8 C), temperature source Oral, resp. rate 18, height 5' 3"  (1.6 m), weight 151 lb (68.5 kg), SpO2 100 %.    HEENT: No thrush or ulcers. Lymphatics: No palpable cervical, supraclavicular, axillary or inguinal lymph nodes. Resp: Lungs clear bilaterally. Cardio: Regular rate and rhythm. GI: Abdomen soft and nontender.  No hepatosplenomegaly. Vascular: No significant leg edema. Musculoskeletal: Tender at the right mid to low back.   Lab Results:  Lab Results  Component Value Date   WBC 3.9 (L) 12/23/2018   HGB 10.4 (L) 12/23/2018   HCT 31.0 (L) 12/23/2018   MCV 103.3 (H) 12/23/2018   PLT 217 12/23/2018   NEUTROABS 2.7 12/23/2018    Imaging:  No results found.  Medications: I have reviewed the patient's current medications.  Assessment/Plan: 1. 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 Loss of MLH1 and PMS2, MSI-high, BRAF mutation detected ? Colonoscopy February 2017 ? Colonoscopy 12/16/2017-3 mm polyp in the transverse colon, 10 mm polyp in the sigmoid colon. Diverticulosis in the sigmoid colon. Transverse colon polyp, tubular adenoma; sigmoid colon polyp, tubular adenoma. ? CT 07/12/2018-probable subcutaneous metastasis at the left abdominal wall, left perinephric mass, destructive thoracic/lumbar paraspinous mass ? CT-guided biopsy of the paraspinous mass 07/13/2018  2.  Anemia-likely secondary to bleeding from the colon cancer and surgery. Hemoglobin in normal range 03/20/2016, mild persistent anemia 3. Ascending colon "polyps" removed on the colonoscopy 12/22/2014 with the pathology revealing poorly differentiated adenocarcinoma and a sessile serrated adenoma polyp, similar to the pathology on the hepatic flexure mass 4. History of multiple skin cancers. Seen by dermatology every 3 months. 5. Glaucoma 6.Non-Hodgkin's lymphoma presenting with severe back pain  CTabdomen/pelvis7/30/2019-large destructive right paraspinal chest wall mass lower thoracic/upper lumbar spinewith associated bone destruction involving the right posterior elements of T9, T10 and T11, spinous processes of T8 and T9, and the posterior right 10th and 11th ribs. Probable subcutaneous metastasis anterior left abdominal wall 2.2 cm; enlarging mass in the left perinephric space 3.0 cm in size.  CT L-spine 07/12/2018-paraspinous soft tissue mass on the right at the lower thoracic spine causing destruction of the right T11 and 12 transverse processes; may encroach on the right T11 neural foramen.  CT-guided biopsy paraspinous mass 07/13/2018-poorly differentiated malignant neoplasm; CD45, CD20,bcl-6 andbcl-2positive. 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 FISH-BCL 6 rearrangement, no BCL-2 or MYC rearrangement  Radiationto paraspinous massinitiated 07/14/2018  Cycle 1 CVP/Rituxan 08/02/2018 08/29/2018-decreased size of right paraspinal soft tissue mass,  PET scan 5/85/9292-KMQKMMNOTR mild metabolic activity within the right paraspinal mass which is decreased significantly in size from the comparison exam. No evidence of lymphoma elsewhere. Focal activity within the distal esophagus above the GE junction without mass lesion or obstruction.  Cycle 2 CVP/Rituxan 08/22/2018,udenyca added  CT abdomen/pelvis-decrease in size of the right paraspinal soft tissue  mass with adjacent lytic abnormality in the right posterior 10th and 11th ribs.  Cycle  3 CVP/Rituxan 09/12/2018-Cytoxan dose reduced  Cycle 4CVP/rituximab 10/03/2018  Cycle 5 CVP/rituximab 11/01/2018  Cycle 6 CVP/Rituxan 11/23/2018  CTs 12/23/2018- mildly reduced volume of abnormal soft tissue at the medial right 10th rib/paraspinal space, destructive changes at the medial right 10th rib and right 11th rib noted 7.Anorexia/weight loss 8.Mild elevation of calcium-questionhypercalcemia of malignancy 9.History of severe neutropeniaand anemiasecondary to radiation and chemotherapy; udenyca added with cycle 2CVP/RituxanCytoxan dose reduced with cycle 3  10. Admission 08/29/2018 with failure to thrive and severe anemia/neutropenia, status post red cell transfusions on 08/30/2018   Disposition: Ms. Propst remains in clinical remission from non-Hodgkin's lymphoma.  We reviewed the basic metabolic panel results from today.  The potassium is in low normal range.  She will continue oral potassium as she is currently taking.  She will return for a follow-up visit in 3 to 4 months.  She will contact the office in the interim with any problems.  Patient seen with Dr. Benay Spice.    Ned Card ANP/GNP-BC   02/24/2019  3:32 PM  This was a shared visit with Ned Card.  Ms. Thien is in remission from [place].  She will return for an office visit in 3-4 months.  Julieanne Manson, MD

## 2019-03-09 ENCOUNTER — Other Ambulatory Visit: Payer: Self-pay | Admitting: Nurse Practitioner

## 2019-04-05 ENCOUNTER — Other Ambulatory Visit: Payer: Self-pay | Admitting: Nurse Practitioner

## 2019-04-05 DIAGNOSIS — C8330 Diffuse large B-cell lymphoma, unspecified site: Secondary | ICD-10-CM

## 2019-04-05 MED ORDER — POTASSIUM CHLORIDE ER 10 MEQ PO TBCR: 10.0000 meq | EXTENDED_RELEASE_TABLET | Freq: Two times a day (BID) | ORAL | 1 refills | Status: DC

## 2019-04-19 ENCOUNTER — Telehealth: Payer: Self-pay | Admitting: Oncology

## 2019-04-19 NOTE — Telephone Encounter (Signed)
Scheduled f/u for august per sch msg. Mailed printout

## 2019-05-09 ENCOUNTER — Other Ambulatory Visit: Payer: Self-pay | Admitting: Nurse Practitioner

## 2019-05-09 DIAGNOSIS — C8330 Diffuse large B-cell lymphoma, unspecified site: Secondary | ICD-10-CM

## 2019-05-10 ENCOUNTER — Other Ambulatory Visit: Payer: Self-pay | Admitting: Nurse Practitioner

## 2019-05-10 DIAGNOSIS — C8330 Diffuse large B-cell lymphoma, unspecified site: Secondary | ICD-10-CM

## 2019-05-10 MED ORDER — POTASSIUM CHLORIDE ER 10 MEQ PO TBCR: 10.0000 meq | EXTENDED_RELEASE_TABLET | Freq: Two times a day (BID) | ORAL | 2 refills | Status: DC

## 2019-05-10 NOTE — Telephone Encounter (Signed)
Gave to Ned Card NP

## 2019-05-12 ENCOUNTER — Telehealth: Payer: Self-pay | Admitting: Oncology

## 2019-05-12 NOTE — Telephone Encounter (Signed)
Added lab to 8/17 f/ju per 5/27 schedule message. Confirmed with patient.

## 2019-05-23 ENCOUNTER — Encounter: Payer: Self-pay | Admitting: *Deleted

## 2019-05-23 ENCOUNTER — Other Ambulatory Visit: Payer: Self-pay | Admitting: *Deleted

## 2019-05-23 DIAGNOSIS — J441 Chronic obstructive pulmonary disease with (acute) exacerbation: Secondary | ICD-10-CM

## 2019-05-23 NOTE — Patient Outreach (Signed)
Referral received from [employer] high cost claimant list, Pt with diagnosis COPD, CKD stage 3, chronic back pain, history large cell lymphoma, anemia, malnutrition.  Outreach call to pt, explained [facility] program, pt states she lives alone and her son and daughter in law assist her with transportation, errands, medications and whatever she needs, pt states she fell back in May and then again this past weekend and was using her walker and states " it just felt like my legs gave way"  Pt did not report her fall this weekend to primary MD. Pt states she has had PT previously in the home and " it did help some but due to covid-19 I don't really want a lot of people coming here"  Pt does not drive and does not feel that outpatient therapy would be a good option for her.  Pt is using her walker and is trying to be mindful and not get in a hurry.  Pt states she has chronic pain and uses extra strength tylenol and tylenol arthritis and " this is helping somewhat"  Pt does not want to take strong pain medication as would increase risk for falls. Pt reports she is not taking any treatments now for cancer "and things seem to be going ok with that for right now" Pt states she cannot hear well and it was difficult for pt to hear RN CM over the telephone today, pt states she would like a hearing aide but cannot afford one, pt states her income is low and it is difficult every month to make ends meet.  RN CM placed order for [facility] CSW to assist with any resources for hearing aide and financial concerns. RN CM faxed barrier letter to primary MD along with today's visit note and reported pt had a fall this weekend.  THN CM Care Plan Problem One     Most Recent Value  Care Plan Problem One  Knowledge deficit related to COPD  Role Documenting the Problem One  Care Management Coordinator  Care Plan for Problem One  Active  THN Long Term Goal   Pt will demonstrate/ verbalize improved self care for COPD within 60 days  THN Long  Term Goal Start Date  05/23/19  Interventions for Problem One Long Term Goal  RN CM reviewed [facility] program, plan of care with pt, reviewed medications over the phone, mailed succesful outreach letter with consent form, 24 hour nurse line magnet, pamphlet  THN CM Short Term Goal #1   Pt will verbalize COPD action plan within 30 days  THN CM Short Term Goal #1 Start Date  05/23/19  Interventions for Short Term Goal #1  RN CM reviewed COPD action  plan, mailed COPD booklet to patient's home    [facility] CM Care Plan Problem Two     Most Recent Value  Care Plan Problem Two  Decreased quality of life related to chronic pain  Role Documenting the Problem Two  Care Management Coordinator  Care Plan for Problem Two  Active  THN CM Short Term Goal #1   Pt will verbalize improved pain control within 30 days  THN CM Short Term Goal #1 Start Date  05/23/19  Interventions for Short Term Goal #2   RN CM talked with pt about pain management strategies such as relaxation, pt states she cannot tolerate strong pain medication especially with living alone and high risk for falls, pt uses tylenol arthritis and states this does help    [facility] CM Care  Plan Problem Three     Most Recent Value  Care Plan Problem Three  Pt high risk for falls  Role Documenting the Problem Three  Care Management Coordinator  Care Plan for Problem Three  Active  THN CM Short Term Goal #1   Pt will verbalize/ demonstrate safety precautions within 30 days  THN CM Short Term Goal #1 Start Date  05/23/19  Interventions for Short Term Goal #1  RN CM reviewed safety precautions, importance of using walker at all times, rising slowly from sitting positions and asking for assistance as needed      PLAN Outreach pt next month for telephone assessment  Jacqlyn Larsen [facility], [place] Coordinator [phone]

## 2019-05-24 ENCOUNTER — Other Ambulatory Visit: Payer: Self-pay

## 2019-05-24 NOTE — Patient Outreach (Signed)
[facility]) Care Management  05/24/2019  ROSEALYNN MATEUS 1936-07-18 [id number]   Successful outreach to patient regarding social work referral.  "Order for social work, pt states she needs hearing aide but unable to afford, would appreciate any resources, also states her monthly income is barely over 1000$ and difficult to make ends meet, would like any resources to help her financially." BSW inquired about whether or not patient has ever applied for Medicaid.  Patient was unsure if an application has ever been submitted but she is over the income limit by approximately $50/month.  BSW informed her of this but told her that it may still be worth applying. BSW and patient talked about a program through [place] [facility] [employer] [facility] [employer] that could assist with obtaining a hearing aid.  Patient does meet the income requirements for this program.  The program does require that patient complete a hearing test with one of their preferred providers as well as a financial assessment.  Patient declined going through this process and declined Medicaid application.  BSW offered to call patient's son to discuss the above information with him and she agreed.   BSW provided son with all of the above information.  Son stated that he was thankful for the information but said that patient "is old enough to decide what she wants to do."  BSW provided patient and son with contact information and encouraged them to call if they would like to pursue Medicaid or services through [facility] [employer] [facility] [employer]. BSW closing case.  Ronn Melena, BSW Social Worker [phone]

## 2019-05-30 ENCOUNTER — Ambulatory Visit: Payer: Medicare Other | Admitting: Oncology

## 2019-06-20 ENCOUNTER — Encounter: Payer: Self-pay | Admitting: *Deleted

## 2019-06-20 ENCOUNTER — Other Ambulatory Visit: Payer: Self-pay | Admitting: *Deleted

## 2019-06-20 NOTE — Patient Outreach (Signed)
Outreach call to pt for telephone assessment, spoke with pt, HIPAA verified, RN CM was able to complete telephone call with pt but with difficulty due to pt being hard of hearing, pt states " I've decided not to try and get a hearing aide, I don't go anywhere so why bother"  Pt states she does not qualify for assistance with the hearing aide and cannot afford.  Pt states she is doing " pretty well"  Pt denies any falls since last conversation, pt states she is to see dermatologist on 08/12/19 and oncology Dr. Benay Spice on 07/31/19 and her son will take her to both appointments.   THN CM Care Plan Problem One     Most Recent Value  Care Plan Problem One  Knowledge deficit related to COPD  Role Documenting the Problem One  Care Management Coordinator  Care Plan for Problem One  Active  THN Long Term Goal   Pt will demonstrate/ verbalize improved self care for COPD within 60 days  THN Long Term Goal Start Date  05/23/19  Interventions for Problem One Long Term Goal  RN CM reinforced plan of care with pt, pt has decided not to pursue getting a hearing aide and BSW closed her case, pt reports she has all medications and taking as prescribed.  THN CM Short Term Goal #1   Pt will verbalize COPD action plan within 30 days  THN CM Short Term Goal #1 Start Date  05/23/19  Interventions for Short Term Goal #1  RN CM reinforced COPD action plan, pt states she is in green zone today    [facility] CM Care Plan Problem Two     Most Recent Value  Care Plan Problem Two  Decreased quality of life related to chronic pain  Role Documenting the Problem Two  Care Management Coordinator  Care Plan for Problem Two  Active  THN CM Short Term Goal #1   Pt will verbalize improved pain control within 30 days  THN CM Short Term Goal #1 Start Date  05/23/19  Interventions for Short Term Goal #2   RN CM reinforced pain management strategies such as relaxation, alternating activity with rest    [facility] CM Care Plan Problem Three     Most  Recent Value  Care Plan Problem Three  Pt high risk for falls  Role Documenting the Problem Three  Care Management Coordinator  Care Plan for Problem Three  Active  THN CM Short Term Goal #1   Pt will verbalize/ demonstrate safety precautions within 30 days  THN CM Short Term Goal #1 Start Date  05/23/19  Interventions for Short Term Goal #1  RN CM reinforced safety precautions, pt states she uses walker every time she ambulates      PLAN Outreach pt next month for telephone assessment Discuss transition of RN health coach  Jacqlyn Larsen [facility], [place] Coordinator [phone]

## 2019-07-03 ENCOUNTER — Other Ambulatory Visit: Payer: Self-pay | Admitting: Nurse Practitioner

## 2019-07-03 DIAGNOSIS — C8330 Diffuse large B-cell lymphoma, unspecified site: Secondary | ICD-10-CM

## 2019-07-04 ENCOUNTER — Telehealth: Payer: Self-pay | Admitting: Oncology

## 2019-07-04 NOTE — Telephone Encounter (Signed)
[facility] PAL 8/17 appointment moved from 8/17 to 8/26. Confirmed with patient.

## 2019-07-06 NOTE — Progress Notes (Signed)
This encounter was created in error - please disregard.

## 2019-07-12 DIAGNOSIS — C44722 Squamous cell carcinoma of skin of right lower limb, including hip: Secondary | ICD-10-CM | POA: Diagnosis not present

## 2019-07-12 DIAGNOSIS — L821 Other seborrheic keratosis: Secondary | ICD-10-CM | POA: Diagnosis not present

## 2019-07-12 DIAGNOSIS — L814 Other melanin hyperpigmentation: Secondary | ICD-10-CM | POA: Diagnosis not present

## 2019-07-12 DIAGNOSIS — C44622 Squamous cell carcinoma of skin of right upper limb, including shoulder: Secondary | ICD-10-CM | POA: Diagnosis not present

## 2019-07-12 DIAGNOSIS — Z85828 Personal history of other malignant neoplasm of skin: Secondary | ICD-10-CM | POA: Diagnosis not present

## 2019-07-12 DIAGNOSIS — D0439 Carcinoma in situ of skin of other parts of face: Secondary | ICD-10-CM | POA: Diagnosis not present

## 2019-07-12 DIAGNOSIS — L570 Actinic keratosis: Secondary | ICD-10-CM | POA: Diagnosis not present

## 2019-07-18 ENCOUNTER — Other Ambulatory Visit: Payer: Self-pay | Admitting: *Deleted

## 2019-07-18 ENCOUNTER — Encounter: Payer: Self-pay | Admitting: *Deleted

## 2019-07-18 NOTE — Patient Outreach (Signed)
Outreach call to pt for telephone assessment,spoke with pt, HIPAA verified, RN CM reviewed with pt that BSW had contacted her and pt declined to pursue applying for medicaid or moving forward with hearing aide resources, pt aggravated during the phone call stating " I can't hardly hear a word you're saying"  Pt reports "breathing has been ok"  Pt states she had "some places removed from my face and arms at dermatologist"  No concerns voiced by pt today,  RN CM reviewed discharge from services today and pt seemed to be agitated about not being able to hear and hung up the phone.  RN CM mailed case closure letter to pt home, faxed case closure letter to primary MD.  [facility] CM Care Plan Problem One     Most Recent Value  Care Plan Problem One  Knowledge deficit related to COPD  Role Documenting the Problem One  Care Management Coordinator  Care Plan for Problem One  Active  THN Long Term Goal   Pt will demonstrate/ verbalize improved self care for COPD within 60 days  THN Long Term Goal Start Date  05/23/19  [facility] Long Term Goal Met Date  07/18/19  Interventions for Problem One Long Term Goal  RN CM reviewed plan of care with pt, pt states she has all medications and taking as prescribed,  RN CM reviewed discharge plan and pt does not feel she can benefit from RN health coach due to difficulty hearing.  THN CM Short Term Goal #1   Pt will verbalize COPD action plan within 30 days  THN CM Short Term Goal #1 Start Date  05/23/19  [facility] CM Short Term Goal #1 Met Date  07/18/19  Interventions for Short Term Goal #1  RN CM reviewed COPD action plan, pt states she is in green zone today    [facility] CM Care Plan Problem Two     Most Recent Value  Care Plan Problem Two  Decreased quality of life related to chronic pain  Role Documenting the Problem Two  Care Management Coordinator  Care Plan for Problem Two  Active  THN CM Short Term Goal #1   Pt will verbalize improved pain control within 30 days  THN CM Short Term  Goal #1 Start Date  05/23/19  [facility] CM Short Term Goal #1 Met Date   07/18/19  Interventions for Short Term Goal #2   RM CM reviewed pain management strategies with pt, pt states pain is no worse but no better and pt not pursuing any other interventions/ surgery    THN CM Care Plan Problem Three     Most Recent Value  Care Plan Problem Three  Pt high risk for falls  Role Documenting the Problem Three  Care Management Coordinator  Care Plan for Problem Three  Active  THN CM Short Term Goal #1   Pt will verbalize/ demonstrate safety precautions within 30 days  THN CM Short Term Goal #1 Start Date  05/23/19  [facility] CM Short Term Goal #1 Met Date  07/18/19  Interventions for Short Term Goal #1  RN CM reviewed safety precautions with pt      PLAN Close case today  Jacqlyn Larsen [facility], [place] Coordinator [phone]

## 2019-07-31 ENCOUNTER — Ambulatory Visit: Payer: Medicare Other | Admitting: Oncology

## 2019-07-31 ENCOUNTER — Other Ambulatory Visit: Payer: Medicare Other

## 2019-08-08 ENCOUNTER — Other Ambulatory Visit: Payer: Self-pay | Admitting: Oncology

## 2019-08-08 DIAGNOSIS — C8330 Diffuse large B-cell lymphoma, unspecified site: Secondary | ICD-10-CM

## 2019-08-08 NOTE — Telephone Encounter (Signed)
Per Dr.Sherrill will hold prescription until tomorrows appt

## 2019-08-09 ENCOUNTER — Other Ambulatory Visit: Payer: Self-pay

## 2019-08-09 ENCOUNTER — Other Ambulatory Visit: Payer: Self-pay | Admitting: *Deleted

## 2019-08-09 ENCOUNTER — Inpatient Hospital Stay: Payer: Medicare Other

## 2019-08-09 ENCOUNTER — Inpatient Hospital Stay: Payer: Medicare Other | Attending: Oncology | Admitting: Oncology

## 2019-08-09 ENCOUNTER — Ambulatory Visit (HOSPITAL_COMMUNITY)
Admission: RE | Admit: 2019-08-09 | Discharge: 2019-08-09 | Disposition: A | Discharge status: Home / Self Care | Payer: Medicare Other | Source: Ambulatory Visit | Attending: Oncology | Admitting: Oncology

## 2019-08-09 VITALS — BP 149/81 | HR 85 | Temp 98.7°F | Resp 18 | Ht 63.0 in | Wt 152.4 lb

## 2019-08-09 DIAGNOSIS — C8580 Other specified types of non-Hodgkin lymphoma, unspecified site: Secondary | ICD-10-CM | POA: Diagnosis not present

## 2019-08-09 DIAGNOSIS — Z9221 Personal history of antineoplastic chemotherapy: Secondary | ICD-10-CM | POA: Diagnosis not present

## 2019-08-09 DIAGNOSIS — R0600 Dyspnea, unspecified: Secondary | ICD-10-CM | POA: Insufficient documentation

## 2019-08-09 DIAGNOSIS — C8590 Non-Hodgkin lymphoma, unspecified, unspecified site: Secondary | ICD-10-CM | POA: Diagnosis not present

## 2019-08-09 DIAGNOSIS — C8330 Diffuse large B-cell lymphoma, unspecified site: Secondary | ICD-10-CM

## 2019-08-09 DIAGNOSIS — Z85038 Personal history of other malignant neoplasm of large intestine: Secondary | ICD-10-CM | POA: Insufficient documentation

## 2019-08-09 DIAGNOSIS — J90 Pleural effusion, not elsewhere classified: Secondary | ICD-10-CM | POA: Diagnosis not present

## 2019-08-09 DIAGNOSIS — R110 Nausea: Secondary | ICD-10-CM | POA: Insufficient documentation

## 2019-08-09 DIAGNOSIS — K5900 Constipation, unspecified: Secondary | ICD-10-CM | POA: Insufficient documentation

## 2019-08-09 LAB — BASIC METABOLIC PANEL - CANCER CENTER ONLY
Anion gap: 11 (ref 5–15)
BUN: 13 mg/dL (ref 8–23)
CO2: 25 mmol/L (ref 22–32)
Calcium: 8.2 mg/dL — ABNORMAL LOW (ref 8.9–10.3)
Chloride: 107 mmol/L (ref 98–111)
Creatinine: 0.99 mg/dL (ref 0.44–1.00)
GFR, Est AFR Am: >60 mL/min (ref >60)
GFR, Estimated: 53 mL/min — ABNORMAL LOW (ref >60)
Glucose, Bld: 87 mg/dL (ref 70–99)
Potassium: 3.8 mmol/L (ref 3.5–5.1)
Sodium: 143 mmol/L (ref 135–145)

## 2019-08-09 NOTE — Progress Notes (Signed)
[place] OFFICE PROGRESS NOTE   Diagnosis: Non-Hodgkin's lymphoma  INTERVAL HISTORY:   Erica Escobar was last seen at the cancer center in March.  She has multiple complaints today including persistent right-sided back discomfort, increased dyspnea constipation, and increased reflux symptoms with nausea.  She lives alone.  She reports 2 falls over the past several months.  She acknowledges her pain is better compared to when she was admitted to the hospital last year. She reports recently having multiple squamous cell carcinomas per dermatologist.  Multiple skin lesions over the extremities were treated by her dermatologist.  Objective:  Vital signs in last 24 hours:  Blood pressure (!) 149/81, pulse 85, temperature 98.7 F (37.1 C), temperature source Oral, resp. rate 18, height 5' 3"  (1.6 m), weight 152 lb 6.4 oz (69.1 kg), SpO2 100 %.    HEENT: Neck without mass Lymphatics: No cervical, supraclavicular, axillary, or inguinal nodes Resp: Decreased breath sounds at the right greater than left posterior chest, no respiratory distress Cardio: Regular rate and rhythm GI: Nontender, no hepatosplenomegaly Vascular: No leg edema Neuro: The motor exam appears intact in the upper and lower extremities bilaterally with 3+/5 strength with flexion at the right hip. Skin: Multiple erythematous slightly raised lesions over the arms and legs.    Lab Results:  Lab Results  Component Value Date   WBC 3.9 (L) 12/23/2018   HGB 10.4 (L) 12/23/2018   HCT 31.0 (L) 12/23/2018   MCV 103.3 (H) 12/23/2018   PLT 217 12/23/2018   NEUTROABS 2.7 12/23/2018    CMP  Lab Results  Component Value Date   NA 143 08/09/2019   K 3.8 08/09/2019   CL 107 08/09/2019   CO2 25 08/09/2019   GLUCOSE 87 08/09/2019   BUN 13 08/09/2019   CREATININE 0.99 08/09/2019   CALCIUM 8.2 (L) 08/09/2019   PROT 5.6 (L) 12/23/2018   ALBUMIN 3.2 (L) 12/23/2018   AST 18 12/23/2018   ALT 10 12/23/2018   ALKPHOS 98 12/23/2018   BILITOT 0.7 12/23/2018   GFRNONAA 53 (L) 08/09/2019   GFRAA >60 08/09/2019    Lab Results  Component Value Date   CEA1 2.2 07/12/2018    Medications: I have reviewed the patient's current medications.   Assessment/Plan:  1. 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 Loss of MLH1 and PMS2, MSI-high, BRAF mutation detected ? Colonoscopy February 2017 ? Colonoscopy 12/16/2017-3 mm polyp in the transverse colon, 10 mm polyp in the sigmoid colon. Diverticulosis in the sigmoid colon. Transverse colon polyp, tubular adenoma; sigmoid colon polyp, tubular adenoma. ? CT 07/12/2018-probable subcutaneous metastasis at the left abdominal wall, left perinephric mass, destructive thoracic/lumbar paraspinous mass ? CT-guided biopsy of the paraspinous mass 07/13/2018  2. Anemia-likely secondary to bleeding from the colon cancer and surgery. Hemoglobin in normal range 03/20/2016, mild persistent anemia 3. Ascending colon "polyps" removed on the colonoscopy 12/22/2014 with the pathology revealing poorly differentiated adenocarcinoma and a sessile serrated adenoma polyp, similar to the pathology on the hepatic flexure mass 4. History of multiple skin cancers. Seen by dermatology every 3 months. 5. Glaucoma 6.Non-Hodgkin's lymphoma presenting with severe back pain  CTabdomen/pelvis7/30/2019-large destructive right paraspinal chest wall mass lower thoracic/upper lumbar spinewith associated bone destruction involving the right posterior elements of T9, T10 and T11, spinous processes of T8 and T9, and the posterior right 10th and 11th ribs. Probable subcutaneous metastasis anterior left abdominal wall 2.2 cm; enlarging  mass in the left perinephric space 3.0 cm in size.  CT L-spine 07/12/2018-paraspinous soft tissue mass on the right at the lower thoracic spine causing destruction of the right T11  and 12 transverse processes; may encroach on the right T11 neural foramen.  CT-guided biopsy paraspinous mass 07/13/2018-poorly differentiated malignant neoplasm; CD45, CD20,bcl-6 andbcl-2positive. 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 FISH-BCL 6 rearrangement, no BCL-2 or MYC rearrangement  Radiationto paraspinous massinitiated 07/14/2018  Cycle 1 CVP/Rituxan 08/02/2018 08/29/2018-decreased size of right paraspinal soft tissue mass,  PET scan 0/17/5102-HENIDPOEUM mild metabolic activity within the right paraspinal mass which is decreased significantly in size from the comparison exam. No evidence of lymphoma elsewhere. Focal activity within the distal esophagus above the GE junction without mass lesion or obstruction.  Cycle 2 CVP/Rituxan 08/22/2018,udenyca added  CT abdomen/pelvis-decrease in size of the right paraspinal soft tissue mass with adjacent lytic abnormality in the right posterior 10th and 11th ribs.  Cycle 3 CVP/Rituxan 09/12/2018-Cytoxan dose reduced  Cycle 4CVP/rituximab 10/03/2018  Cycle 5 CVP/rituximab 11/01/2018  Cycle 6 CVP/Rituxan 11/23/2018  CTs 12/23/2018- mildly reduced volume of abnormal soft tissue at the medial right 10th rib/paraspinal space, destructive changes at the medial right 10th rib and right 11th rib noted 7.Anorexia/weight loss 8.Mild elevation of calcium-questionhypercalcemia of malignancy 9.History of severe neutropeniaand anemiasecondary to radiation and chemotherapy; udenyca added with cycle 2CVP/RituxanCytoxan dose reduced with cycle 3  10. Admission 08/29/2018 with failure to thrive and severe anemia/neutropenia, status post red cell transfusions on 08/30/2018   Disposition: Erica Escobar is to be in remission from non-Hodgkin's lymphoma.  I doubt her pain and other symptoms are related to progressive disease.  We will refer her for a chest x-ray to follow-up on the dyspnea and abnormal chest exam.   She appears to have chronic weakness of the legs, worse on the right.  I encouraged her to use her walker.  Her weight is stable compared to when she was here in March.  She will return for office visit 3 months.  We will schedule restaging CTs if there is clinical evidence of disease progression.  Betsy Coder, MD  08/09/2019  12:08 PM

## 2019-08-10 ENCOUNTER — Telehealth: Payer: Self-pay | Admitting: *Deleted

## 2019-08-10 ENCOUNTER — Telehealth: Payer: Self-pay | Admitting: Oncology

## 2019-08-10 DIAGNOSIS — C8330 Diffuse large B-cell lymphoma, unspecified site: Secondary | ICD-10-CM

## 2019-08-10 NOTE — Telephone Encounter (Signed)
Notified of CXR results and to call for increased dyspnea. Will see again in 1 month w/CXR. Scheduling message entered.

## 2019-08-10 NOTE — Telephone Encounter (Signed)
Called and spoke with patient. Confirmed appt  °

## 2019-08-10 NOTE — Telephone Encounter (Signed)
-----   Message from Ladell Pier, MD sent at 08/09/2019  9:02 PM EDT ----- Please call patient, fluid around rt. Lung has increased from January, call for increased dyspnea, scheduled cxr and office sherrill or lisa 1 month

## 2019-08-11 ENCOUNTER — Telehealth: Payer: Self-pay | Admitting: Oncology

## 2019-08-11 NOTE — Telephone Encounter (Signed)
Scheduled appt per 8/27 sch message - pt is aware of appt date and time

## 2019-08-24 ENCOUNTER — Telehealth: Payer: Self-pay | Admitting: *Deleted

## 2019-08-24 ENCOUNTER — Other Ambulatory Visit: Payer: Self-pay | Admitting: Oncology

## 2019-08-24 DIAGNOSIS — C8330 Diffuse large B-cell lymphoma, unspecified site: Secondary | ICD-10-CM

## 2019-08-24 MED ORDER — ONDANSETRON HCL 4 MG PO TABS: 4.0000 mg | ORAL_TABLET | Freq: Three times a day (TID) | ORAL | 0 refills | Status: AC | PRN

## 2019-08-24 NOTE — Telephone Encounter (Addendum)
Reports her mother is having a lot of nausea and has vomited. Is asking for refill on the zofran she used to take. She is not having diarrhea or fever. Per Dr. Benay Spice: OK to refill. Call tomorrow if not better or there is progression of symptoms. Daughter understands and agrees

## 2019-09-07 ENCOUNTER — Telehealth: Payer: Self-pay | Admitting: *Deleted

## 2019-09-07 ENCOUNTER — Other Ambulatory Visit: Payer: Self-pay | Admitting: Oncology

## 2019-09-07 DIAGNOSIS — C8330 Diffuse large B-cell lymphoma, unspecified site: Secondary | ICD-10-CM

## 2019-09-07 NOTE — Telephone Encounter (Addendum)
Received refill request for K+ today. Called patient she reports diarrhea is very rare, usually is constipation. She is not on diuretic. Last K+ 3.8 on 08/09/19. Next OV 09/11/19. Per Dr. Benay Spice: Hold off on refill for now. Will recheck BMP next week to see if she needs to continue this. Patient notified.

## 2019-09-11 ENCOUNTER — Ambulatory Visit (HOSPITAL_COMMUNITY)
Admission: RE | Admit: 2019-09-11 | Discharge: 2019-09-11 | Disposition: A | Discharge status: Home / Self Care | Payer: Medicare Other | Source: Ambulatory Visit | Attending: Oncology | Admitting: Oncology

## 2019-09-11 ENCOUNTER — Other Ambulatory Visit: Payer: Self-pay

## 2019-09-11 ENCOUNTER — Inpatient Hospital Stay: Payer: Medicare Other

## 2019-09-11 ENCOUNTER — Inpatient Hospital Stay: Payer: Medicare Other | Attending: Oncology | Admitting: Oncology

## 2019-09-11 VITALS — BP 132/83 | HR 87 | Temp 98.9°F | Resp 18 | Ht 63.0 in | Wt 160.2 lb

## 2019-09-11 DIAGNOSIS — R0602 Shortness of breath: Secondary | ICD-10-CM | POA: Insufficient documentation

## 2019-09-11 DIAGNOSIS — Z85038 Personal history of other malignant neoplasm of large intestine: Secondary | ICD-10-CM | POA: Diagnosis not present

## 2019-09-11 DIAGNOSIS — C8330 Diffuse large B-cell lymphoma, unspecified site: Secondary | ICD-10-CM

## 2019-09-11 DIAGNOSIS — J90 Pleural effusion, not elsewhere classified: Secondary | ICD-10-CM | POA: Diagnosis not present

## 2019-09-11 DIAGNOSIS — Z9221 Personal history of antineoplastic chemotherapy: Secondary | ICD-10-CM | POA: Insufficient documentation

## 2019-09-11 DIAGNOSIS — M7989 Other specified soft tissue disorders: Secondary | ICD-10-CM | POA: Insufficient documentation

## 2019-09-11 DIAGNOSIS — C8590 Non-Hodgkin lymphoma, unspecified, unspecified site: Secondary | ICD-10-CM | POA: Insufficient documentation

## 2019-09-11 LAB — BASIC METABOLIC PANEL - CANCER CENTER ONLY
Anion gap: 5 (ref 5–15)
BUN: 13 mg/dL (ref 8–23)
CO2: 26 mmol/L (ref 22–32)
Calcium: 8.1 mg/dL — ABNORMAL LOW (ref 8.9–10.3)
Chloride: 109 mmol/L (ref 98–111)
Creatinine: 1.01 mg/dL — ABNORMAL HIGH (ref 0.44–1.00)
GFR, Est AFR Am: >60 mL/min (ref >60)
GFR, Estimated: 52 mL/min — ABNORMAL LOW (ref >60)
Glucose, Bld: 89 mg/dL (ref 70–99)
Potassium: 3.7 mmol/L (ref 3.5–5.1)
Sodium: 140 mmol/L (ref 135–145)

## 2019-09-11 LAB — CBC WITH DIFFERENTIAL (CANCER CENTER ONLY)
Abs Immature Granulocytes: 0.01 10*3/uL (ref 0.00–0.07)
Basophils Absolute: 0 10*3/uL (ref 0.0–0.1)
Basophils Relative: 0 %
Eosinophils Absolute: 0 10*3/uL (ref 0.0–0.5)
Eosinophils Relative: 1 %
HCT: 34.4 % — ABNORMAL LOW (ref 36.0–46.0)
Hemoglobin: 11.8 g/dL — ABNORMAL LOW (ref 12.0–15.0)
Immature Granulocytes: 0 %
Lymphocytes Relative: 21 %
Lymphs Abs: 0.6 10*3/uL — ABNORMAL LOW (ref 0.7–4.0)
MCH: 33.6 pg (ref 26.0–34.0)
MCHC: 34.3 g/dL (ref 30.0–36.0)
MCV: 98 fL (ref 80.0–100.0)
Monocytes Absolute: 0.4 10*3/uL (ref 0.1–1.0)
Monocytes Relative: 16 %
Neutro Abs: 1.6 10*3/uL — ABNORMAL LOW (ref 1.7–7.7)
Neutrophils Relative %: 62 %
Platelet Count: 108 10*3/uL — ABNORMAL LOW (ref 150–400)
RBC: 3.51 MIL/uL — ABNORMAL LOW (ref 3.87–5.11)
RDW: 14.4 % (ref 11.5–15.5)
WBC Count: 2.7 10*3/uL — ABNORMAL LOW (ref 4.0–10.5)
nRBC: 0 % (ref 0.0–0.2)

## 2019-09-11 LAB — BRAIN NATRIURETIC PEPTIDE: B Natriuretic Peptide: 73.2 pg/mL (ref 0.0–100.0)

## 2019-09-11 NOTE — Progress Notes (Signed)
Patient taken back to lab for CBC and BNP and then to radiology for CXR. Called daughter-in-law, Lawerance Bach and informed her that patient is stable per lymphoma standpoint. Something is going on with her lungs or her heart. Will have results tomorrow and she will be called. Dr. Benay Spice suggests she get in to see Dr. Shelia Media this week. Also informed her that she does not need the potassium unless Dr. Shelia Media puts her on a diuretic.

## 2019-09-11 NOTE — Progress Notes (Signed)
[facility] OFFICE PROGRESS NOTE   Diagnosis: Non-Hodgkin's lymphoma  INTERVAL HISTORY:   Erica Escobar returns prior to a scheduled visit.  She reports increased shortness of breath and leg swelling for the past 4-5 days.  No fever or cough.  She continues to live alone.  She is drinking boost as a nutrition supplement.  Objective:  Vital signs in last 24 hours:  Blood pressure 132/83, pulse 87, temperature 98.9 F (37.2 C), temperature source Oral, resp. rate 18, height 5' 3" (1.6 m), weight 160 lb 3.2 oz (72.7 kg), SpO2 100 %.     Lymphatics: No cervical, supraclavicular, or axillary nodes Resp: Diminished breath sounds at the right lower chest, increased respiratory rate Cardio: Regular rate and rhythm GI: No hepatosplenomegaly Vascular: Trace lower leg edema bilaterally  Lab Results:  Lab Results  Component Value Date   WBC 3.9 (L) 12/23/2018   HGB 10.4 (L) 12/23/2018   HCT 31.0 (L) 12/23/2018   MCV 103.3 (H) 12/23/2018   PLT 217 12/23/2018   NEUTROABS 2.7 12/23/2018    CMP  Lab Results  Component Value Date   NA 140 09/11/2019   K 3.7 09/11/2019   CL 109 09/11/2019   CO2 26 09/11/2019   GLUCOSE 89 09/11/2019   BUN 13 09/11/2019   CREATININE 1.01 (H) 09/11/2019   CALCIUM 8.1 (L) 09/11/2019   PROT 5.6 (L) 12/23/2018   ALBUMIN 3.2 (L) 12/23/2018   AST 18 12/23/2018   ALT 10 12/23/2018   ALKPHOS 98 12/23/2018   BILITOT 0.7 12/23/2018   GFRNONAA 52 (L) 09/11/2019   GFRAA >60 09/11/2019    Lab Results  Component Value Date   CEA1 2.2 07/12/2018     Medications: I have reviewed the patient's current medications.   Assessment/Plan: 1. 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 Loss of MLH1 and PMS2, MSI-high, BRAF mutation detected ? Colonoscopy February 2017 ? Colonoscopy 12/16/2017-3 mm polyp in the transverse colon, 10 mm polyp in the sigmoid colon.  Diverticulosis in the sigmoid colon. Transverse colon polyp, tubular adenoma; sigmoid colon polyp, tubular adenoma. ? CT 07/12/2018-probable subcutaneous metastasis at the left abdominal wall, left perinephric mass, destructive thoracic/lumbar paraspinous mass ? CT-guided biopsy of the paraspinous mass 07/13/2018  2. Anemia-likely secondary to bleeding from the colon cancer and surgery. Hemoglobin in normal range 03/20/2016, mild persistent anemia 3. Ascending colon "polyps" removed on the colonoscopy 12/22/2014 with the pathology revealing poorly differentiated adenocarcinoma and a sessile serrated adenoma polyp, similar to the pathology on the hepatic flexure mass 4. History of multiple skin cancers. Seen by dermatology every 3 months. 5. Glaucoma 6.Non-Hodgkin's lymphoma presenting with severe back pain  CTabdomen/pelvis7/30/2019-large destructive right paraspinal chest wall mass lower thoracic/upper lumbar spinewith associated bone destruction involving the right posterior elements of T9, T10 and T11, spinous processes of T8 and T9, and the posterior right 10th and 11th ribs. Probable subcutaneous metastasis anterior left abdominal wall 2.2 cm; enlarging mass in the left perinephric space 3.0 cm in size.  CT L-spine 07/12/2018-paraspinous soft tissue mass on the right at the lower thoracic spine causing destruction of the right T11 and 12 transverse processes; may encroach on the right T11 neural foramen.  CT-guided biopsy paraspinous mass 07/13/2018-poorly differentiated malignant neoplasm; CD45, CD20,bcl-6 andbcl-2positive. 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 FISH-BCL 6 rearrangement, no BCL-2 or MYC rearrangement  Radiationto paraspinous massinitiated 07/14/2018  Cycle 1 CVP/Rituxan 08/02/2018 08/29/2018-decreased  size of right paraspinal soft tissue mass,  PET scan 0/16/0109-[id number] mild metabolic activity within the right paraspinal mass  which is decreased significantly in size from the comparison exam. No evidence of lymphoma elsewhere. Focal activity within the distal esophagus above the GE junction without mass lesion or obstruction.  Cycle 2 CVP/Rituxan 08/22/2018,udenyca added  CT abdomen/pelvis-decrease in size of the right paraspinal soft tissue mass with adjacent lytic abnormality in the right posterior 10th and 11th ribs.  Cycle 3 CVP/Rituxan 09/12/2018-Cytoxan dose reduced  Cycle 4CVP/rituximab 10/03/2018  Cycle 5 CVP/rituximab 11/01/2018  Cycle 6 CVP/Rituxan 11/23/2018  CTs 12/23/2018- mildly reduced volume of abnormal soft tissue at the medial right 10th rib/paraspinal space, destructive changes at the medial right 10th rib and right 11th rib noted 7.Anorexia/weight loss 8.Mild elevation of calcium-questionhypercalcemia of malignancy 9.History of severe neutropeniaand anemiasecondary to radiation and chemotherapy; udenyca added with cycle 2CVP/RituxanCytoxan dose reduced with cycle 3  10. Admission 08/29/2018 with failure to thrive and severe anemia/neutropenia, status post red cell transfusions on 08/30/2018     Disposition: Erica Escobar has a history of non-Hodgkin's lymphoma.  She presents today with increased dyspnea and leg swelling.  A chest x-ray last month revealed a small to moderate right pleural effusion.  We will check a chest x-ray to follow-up on the pleural effusion and look for evidence of congestive heart failure.  She has gained 8 pounds over the past month.  I doubt the dyspnea is related to lymphoma.  We will also check a CBC and BNP today.  I suggested she follow-up with Dr. Shelia Media this week to evaluate the dyspnea.  We contacted her daughter to discuss her status and diagnostic plan.  Erica Coder, MD  09/11/2019  4:07 PM

## 2019-09-12 ENCOUNTER — Telehealth: Payer: Self-pay | Admitting: *Deleted

## 2019-09-12 NOTE — Telephone Encounter (Signed)
Notified daughter that Dr Benay Spice said labs and Xray look ok. Needs to follow up with Dr Shelia Media. Verbalized understanding

## 2019-09-13 ENCOUNTER — Encounter: Payer: Self-pay | Admitting: *Deleted

## 2019-09-13 NOTE — Progress Notes (Signed)
Faxed copy of CXR to Dr. Shelia Media at [phone] with Dr. Gearldine Shown message on cover sheet: Does she need an echocardiogram?

## 2019-09-14 ENCOUNTER — Telehealth: Payer: Self-pay | Admitting: *Deleted

## 2019-09-14 NOTE — Telephone Encounter (Signed)
Called requesting lab results and CXR results from Monday. Her daughter-in-law, Vaughan Basta had not told her results. Reviewed labs and told her that pleural effusion and cardiomegaly is stable. Her Hgb is mildly decreased. She does not need to continue K+ since it is now normal and she is not on a diuretic. Her symptoms are not related to her NHL. Informed her that all results were forwarded to Dr. Shelia Media.

## 2019-10-05 DIAGNOSIS — I10 Essential (primary) hypertension: Secondary | ICD-10-CM | POA: Diagnosis not present

## 2019-10-05 DIAGNOSIS — R748 Abnormal levels of other serum enzymes: Secondary | ICD-10-CM | POA: Diagnosis not present

## 2019-10-05 DIAGNOSIS — M810 Age-related osteoporosis without current pathological fracture: Secondary | ICD-10-CM | POA: Diagnosis not present

## 2019-10-12 ENCOUNTER — Encounter (HOSPITAL_COMMUNITY): Payer: Self-pay

## 2019-10-12 ENCOUNTER — Emergency Department (HOSPITAL_COMMUNITY): Payer: Medicare Other

## 2019-10-12 ENCOUNTER — Other Ambulatory Visit: Payer: Self-pay

## 2019-10-12 ENCOUNTER — Inpatient Hospital Stay (HOSPITAL_COMMUNITY)
Admission: EM | Admit: 2019-10-12 | Discharge: 2019-10-19 | DRG: 843 | Disposition: A | Discharge status: Skilled Nursing Facility | Payer: Medicare Other | Attending: Internal Medicine | Admitting: Internal Medicine

## 2019-10-12 DIAGNOSIS — Z7982 Long term (current) use of aspirin: Secondary | ICD-10-CM

## 2019-10-12 DIAGNOSIS — E8770 Fluid overload, unspecified: Secondary | ICD-10-CM | POA: Diagnosis not present

## 2019-10-12 DIAGNOSIS — C182 Malignant neoplasm of ascending colon: Secondary | ICD-10-CM | POA: Diagnosis present

## 2019-10-12 DIAGNOSIS — Z66 Do not resuscitate: Secondary | ICD-10-CM | POA: Diagnosis present

## 2019-10-12 DIAGNOSIS — R279 Unspecified lack of coordination: Secondary | ICD-10-CM | POA: Diagnosis not present

## 2019-10-12 DIAGNOSIS — R112 Nausea with vomiting, unspecified: Secondary | ICD-10-CM | POA: Diagnosis not present

## 2019-10-12 DIAGNOSIS — Z91011 Allergy to milk products: Secondary | ICD-10-CM

## 2019-10-12 DIAGNOSIS — K219 Gastro-esophageal reflux disease without esophagitis: Secondary | ICD-10-CM | POA: Diagnosis not present

## 2019-10-12 DIAGNOSIS — J9601 Acute respiratory failure with hypoxia: Secondary | ICD-10-CM | POA: Diagnosis present

## 2019-10-12 DIAGNOSIS — I129 Hypertensive chronic kidney disease with stage 1 through stage 4 chronic kidney disease, or unspecified chronic kidney disease: Secondary | ICD-10-CM | POA: Diagnosis present

## 2019-10-12 DIAGNOSIS — N182 Chronic kidney disease, stage 2 (mild): Secondary | ICD-10-CM | POA: Diagnosis present

## 2019-10-12 DIAGNOSIS — Z8572 Personal history of non-Hodgkin lymphomas: Secondary | ICD-10-CM

## 2019-10-12 DIAGNOSIS — R42 Dizziness and giddiness: Secondary | ICD-10-CM | POA: Diagnosis not present

## 2019-10-12 DIAGNOSIS — K5730 Diverticulosis of large intestine without perforation or abscess without bleeding: Secondary | ICD-10-CM | POA: Diagnosis not present

## 2019-10-12 DIAGNOSIS — Z9841 Cataract extraction status, right eye: Secondary | ICD-10-CM | POA: Diagnosis not present

## 2019-10-12 DIAGNOSIS — E8809 Other disorders of plasma-protein metabolism, not elsewhere classified: Principal | ICD-10-CM | POA: Diagnosis present

## 2019-10-12 DIAGNOSIS — Z85828 Personal history of other malignant neoplasm of skin: Secondary | ICD-10-CM

## 2019-10-12 DIAGNOSIS — R0902 Hypoxemia: Secondary | ICD-10-CM | POA: Diagnosis not present

## 2019-10-12 DIAGNOSIS — Z981 Arthrodesis status: Secondary | ICD-10-CM

## 2019-10-12 DIAGNOSIS — K5900 Constipation, unspecified: Secondary | ICD-10-CM | POA: Diagnosis not present

## 2019-10-12 DIAGNOSIS — D61818 Other pancytopenia: Secondary | ICD-10-CM | POA: Diagnosis present

## 2019-10-12 DIAGNOSIS — Z87891 Personal history of nicotine dependence: Secondary | ICD-10-CM | POA: Diagnosis not present

## 2019-10-12 DIAGNOSIS — H04123 Dry eye syndrome of bilateral lacrimal glands: Secondary | ICD-10-CM | POA: Diagnosis not present

## 2019-10-12 DIAGNOSIS — Z832 Family history of diseases of the blood and blood-forming organs and certain disorders involving the immune mechanism: Secondary | ICD-10-CM

## 2019-10-12 DIAGNOSIS — R188 Other ascites: Secondary | ICD-10-CM | POA: Diagnosis not present

## 2019-10-12 DIAGNOSIS — Z880 Allergy status to penicillin: Secondary | ICD-10-CM

## 2019-10-12 DIAGNOSIS — E785 Hyperlipidemia, unspecified: Secondary | ICD-10-CM | POA: Diagnosis present

## 2019-10-12 DIAGNOSIS — Z833 Family history of diabetes mellitus: Secondary | ICD-10-CM

## 2019-10-12 DIAGNOSIS — Z743 Need for continuous supervision: Secondary | ICD-10-CM | POA: Diagnosis not present

## 2019-10-12 DIAGNOSIS — Z8249 Family history of ischemic heart disease and other diseases of the circulatory system: Secondary | ICD-10-CM

## 2019-10-12 DIAGNOSIS — H9190 Unspecified hearing loss, unspecified ear: Secondary | ICD-10-CM | POA: Diagnosis present

## 2019-10-12 DIAGNOSIS — J449 Chronic obstructive pulmonary disease, unspecified: Secondary | ICD-10-CM | POA: Diagnosis present

## 2019-10-12 DIAGNOSIS — E876 Hypokalemia: Secondary | ICD-10-CM | POA: Diagnosis present

## 2019-10-12 DIAGNOSIS — I951 Orthostatic hypotension: Secondary | ICD-10-CM | POA: Diagnosis not present

## 2019-10-12 DIAGNOSIS — Z20828 Contact with and (suspected) exposure to other viral communicable diseases: Secondary | ICD-10-CM | POA: Diagnosis present

## 2019-10-12 DIAGNOSIS — Z9049 Acquired absence of other specified parts of digestive tract: Secondary | ICD-10-CM | POA: Diagnosis not present

## 2019-10-12 DIAGNOSIS — M19042 Primary osteoarthritis, left hand: Secondary | ICD-10-CM | POA: Diagnosis present

## 2019-10-12 DIAGNOSIS — Z888 Allergy status to other drugs, medicaments and biological substances status: Secondary | ICD-10-CM

## 2019-10-12 DIAGNOSIS — M19041 Primary osteoarthritis, right hand: Secondary | ICD-10-CM | POA: Diagnosis present

## 2019-10-12 DIAGNOSIS — R296 Repeated falls: Secondary | ICD-10-CM | POA: Diagnosis present

## 2019-10-12 DIAGNOSIS — Z885 Allergy status to narcotic agent status: Secondary | ICD-10-CM

## 2019-10-12 DIAGNOSIS — Z9221 Personal history of antineoplastic chemotherapy: Secondary | ICD-10-CM

## 2019-10-12 DIAGNOSIS — K449 Diaphragmatic hernia without obstruction or gangrene: Secondary | ICD-10-CM | POA: Diagnosis present

## 2019-10-12 DIAGNOSIS — T7840XD Allergy, unspecified, subsequent encounter: Secondary | ICD-10-CM | POA: Diagnosis not present

## 2019-10-12 DIAGNOSIS — K7460 Unspecified cirrhosis of liver: Secondary | ICD-10-CM | POA: Diagnosis not present

## 2019-10-12 DIAGNOSIS — I361 Nonrheumatic tricuspid (valve) insufficiency: Secondary | ICD-10-CM | POA: Diagnosis not present

## 2019-10-12 DIAGNOSIS — Z433 Encounter for attention to colostomy: Secondary | ICD-10-CM | POA: Diagnosis not present

## 2019-10-12 DIAGNOSIS — Z9842 Cataract extraction status, left eye: Secondary | ICD-10-CM

## 2019-10-12 DIAGNOSIS — N183 Chronic kidney disease, stage 3 unspecified: Secondary | ICD-10-CM | POA: Diagnosis present

## 2019-10-12 DIAGNOSIS — I10 Essential (primary) hypertension: Secondary | ICD-10-CM | POA: Diagnosis not present

## 2019-10-12 DIAGNOSIS — G2581 Restless legs syndrome: Secondary | ICD-10-CM | POA: Diagnosis present

## 2019-10-12 DIAGNOSIS — R52 Pain, unspecified: Secondary | ICD-10-CM | POA: Diagnosis not present

## 2019-10-12 DIAGNOSIS — K766 Portal hypertension: Secondary | ICD-10-CM | POA: Diagnosis present

## 2019-10-12 DIAGNOSIS — Z90710 Acquired absence of both cervix and uterus: Secondary | ICD-10-CM | POA: Diagnosis not present

## 2019-10-12 DIAGNOSIS — K5909 Other constipation: Secondary | ICD-10-CM | POA: Diagnosis present

## 2019-10-12 DIAGNOSIS — J9600 Acute respiratory failure, unspecified whether with hypoxia or hypercapnia: Secondary | ICD-10-CM | POA: Diagnosis not present

## 2019-10-12 DIAGNOSIS — N1831 Chronic kidney disease, stage 3a: Secondary | ICD-10-CM | POA: Diagnosis not present

## 2019-10-12 DIAGNOSIS — R600 Localized edema: Secondary | ICD-10-CM | POA: Diagnosis not present

## 2019-10-12 DIAGNOSIS — Z85038 Personal history of other malignant neoplasm of large intestine: Secondary | ICD-10-CM | POA: Diagnosis not present

## 2019-10-12 DIAGNOSIS — Z881 Allergy status to other antibiotic agents status: Secondary | ICD-10-CM

## 2019-10-12 DIAGNOSIS — R21 Rash and other nonspecific skin eruption: Secondary | ICD-10-CM | POA: Diagnosis not present

## 2019-10-12 DIAGNOSIS — Z79899 Other long term (current) drug therapy: Secondary | ICD-10-CM

## 2019-10-12 DIAGNOSIS — G629 Polyneuropathy, unspecified: Secondary | ICD-10-CM | POA: Diagnosis present

## 2019-10-12 DIAGNOSIS — H409 Unspecified glaucoma: Secondary | ICD-10-CM | POA: Diagnosis present

## 2019-10-12 DIAGNOSIS — J90 Pleural effusion, not elsewhere classified: Secondary | ICD-10-CM | POA: Diagnosis present

## 2019-10-12 DIAGNOSIS — R0602 Shortness of breath: Secondary | ICD-10-CM | POA: Diagnosis not present

## 2019-10-12 DIAGNOSIS — R062 Wheezing: Secondary | ICD-10-CM | POA: Diagnosis not present

## 2019-10-12 LAB — CBC WITH DIFFERENTIAL/PLATELET
Abs Immature Granulocytes: 0.01 10*3/uL (ref 0.00–0.07)
Basophils Absolute: 0 10*3/uL (ref 0.0–0.1)
Basophils Relative: 0 %
Eosinophils Absolute: 0 10*3/uL (ref 0.0–0.5)
Eosinophils Relative: 1 %
HCT: 33.3 % — ABNORMAL LOW (ref 36.0–46.0)
Hemoglobin: 11.2 g/dL — ABNORMAL LOW (ref 12.0–15.0)
Immature Granulocytes: 0 %
Lymphocytes Relative: 15 %
Lymphs Abs: 0.4 10*3/uL — ABNORMAL LOW (ref 0.7–4.0)
MCH: 33.2 pg (ref 26.0–34.0)
MCHC: 33.6 g/dL (ref 30.0–36.0)
MCV: 98.8 fL (ref 80.0–100.0)
Monocytes Absolute: 0.5 10*3/uL (ref 0.1–1.0)
Monocytes Relative: 18 %
Neutro Abs: 1.8 10*3/uL (ref 1.7–7.7)
Neutrophils Relative %: 66 %
Platelets: 109 10*3/uL — ABNORMAL LOW (ref 150–400)
RBC: 3.37 MIL/uL — ABNORMAL LOW (ref 3.87–5.11)
RDW: 15.1 % (ref 11.5–15.5)
WBC: 2.8 10*3/uL — ABNORMAL LOW (ref 4.0–10.5)
nRBC: 0 % (ref 0.0–0.2)

## 2019-10-12 LAB — COMPREHENSIVE METABOLIC PANEL
ALT: 29 U/L (ref 0–44)
AST: 48 U/L — ABNORMAL HIGH (ref 15–41)
Albumin: 2.7 g/dL — ABNORMAL LOW (ref 3.5–5.0)
Alkaline Phosphatase: 325 U/L — ABNORMAL HIGH (ref 38–126)
Anion gap: 8 (ref 5–15)
BUN: 16 mg/dL (ref 8–23)
CO2: 25 mmol/L (ref 22–32)
Calcium: 8.2 mg/dL — ABNORMAL LOW (ref 8.9–10.3)
Chloride: 109 mmol/L (ref 98–111)
Creatinine, Ser: 1.08 mg/dL — ABNORMAL HIGH (ref 0.44–1.00)
GFR calc Af Amer: 56 mL/min — ABNORMAL LOW (ref >60)
GFR calc non Af Amer: 48 mL/min — ABNORMAL LOW (ref >60)
Glucose, Bld: 95 mg/dL (ref 70–99)
Potassium: 3.3 mmol/L — ABNORMAL LOW (ref 3.5–5.1)
Sodium: 142 mmol/L (ref 135–145)
Total Bilirubin: 1.6 mg/dL — ABNORMAL HIGH (ref 0.3–1.2)
Total Protein: 4.9 g/dL — ABNORMAL LOW (ref 6.5–8.1)

## 2019-10-12 LAB — BRAIN NATRIURETIC PEPTIDE: B Natriuretic Peptide: 57.7 pg/mL (ref 0.0–100.0)

## 2019-10-12 LAB — MAGNESIUM: Magnesium: 1.7 mg/dL (ref 1.7–2.4)

## 2019-10-12 LAB — TROPONIN I (HIGH SENSITIVITY)
Troponin I (High Sensitivity): 5 ng/L (ref <18)
Troponin I (High Sensitivity): 7 ng/L (ref <18)

## 2019-10-12 LAB — LIPASE, BLOOD: Lipase: 54 U/L — ABNORMAL HIGH (ref 11–51)

## 2019-10-12 MED ORDER — MIRTAZAPINE 7.5 MG PO TABS
7.5000 mg | ORAL_TABLET | Freq: Every day | ORAL | Status: DC
  Administered 2019-10-13 – 2019-10-15 (×4): 7.5 mg via ORAL
  Filled 2019-10-12 (×5): qty 1

## 2019-10-12 MED ORDER — GEMFIBROZIL 600 MG PO TABS
600.0000 mg | ORAL_TABLET | Freq: Every morning | ORAL | Status: DC
  Administered 2019-10-13 – 2019-10-19 (×7): 600 mg via ORAL
  Filled 2019-10-12 (×7): qty 1

## 2019-10-12 MED ORDER — ENSURE ENLIVE PO LIQD
237.0000 mL | Freq: Every day | ORAL | Status: DC
  Administered 2019-10-13 – 2019-10-17 (×5): 237 mL via ORAL

## 2019-10-12 MED ORDER — SODIUM CHLORIDE 0.9% FLUSH
3.0000 mL | Freq: Two times a day (BID) | INTRAVENOUS | Status: DC
  Administered 2019-10-13 – 2019-10-19 (×14): 3 mL via INTRAVENOUS

## 2019-10-12 MED ORDER — ADULT MULTIVITAMIN W/MINERALS CH
1.0000 | ORAL_TABLET | Freq: Every day | ORAL | Status: DC
  Administered 2019-10-13 – 2019-10-19 (×7): 1 via ORAL
  Filled 2019-10-12 (×7): qty 1

## 2019-10-12 MED ORDER — VITAMIN B-12 1000 MCG PO TABS
1000.0000 ug | ORAL_TABLET | Freq: Every day | ORAL | Status: DC
  Administered 2019-10-13 – 2019-10-19 (×7): 1000 ug via ORAL
  Filled 2019-10-12 (×7): qty 1

## 2019-10-12 MED ORDER — POTASSIUM CHLORIDE 20 MEQ/15ML (10%) PO SOLN
40.0000 meq | Freq: Once | ORAL | Status: AC
  Administered 2019-10-13: 40 meq via ORAL
  Filled 2019-10-12: qty 30

## 2019-10-12 MED ORDER — FUROSEMIDE 10 MG/ML IJ SOLN
20.0000 mg | Freq: Two times a day (BID) | INTRAMUSCULAR | Status: DC
  Administered 2019-10-13 – 2019-10-17 (×10): 20 mg via INTRAVENOUS
  Filled 2019-10-12 (×10): qty 2

## 2019-10-12 MED ORDER — PANTOPRAZOLE SODIUM 40 MG PO TBEC
40.0000 mg | DELAYED_RELEASE_TABLET | Freq: Every day | ORAL | Status: DC
  Administered 2019-10-13 – 2019-10-19 (×7): 40 mg via ORAL
  Filled 2019-10-12 (×7): qty 1

## 2019-10-12 MED ORDER — IOHEXOL 300 MG/ML  SOLN
100.0000 mL | Freq: Once | INTRAMUSCULAR | Status: AC | PRN
  Administered 2019-10-12: 100 mL via INTRAVENOUS

## 2019-10-12 MED ORDER — FLUDROCORTISONE ACETATE 0.1 MG PO TABS
0.2000 mg | ORAL_TABLET | Freq: Every day | ORAL | Status: DC
  Administered 2019-10-13 – 2019-10-18 (×6): 0.2 mg via ORAL
  Filled 2019-10-12 (×6): qty 2

## 2019-10-12 NOTE — ED Notes (Signed)
Purewick placed on pt. 

## 2019-10-12 NOTE — ED Notes (Signed)
Pt transported to CT ?

## 2019-10-12 NOTE — ED Provider Notes (Signed)
[facility] DEPT Provider Note   CSN: [id number] Arrival date & time: 10/12/19  1735     History   Chief Complaint Chief Complaint  Patient presents with  . Shortness of Breath    HPI Erica Escobar is a 83 y.o. female.     Patient is 83 year old female who has a history of non-Hodgkin's lymphoma, prior colon cancer, COPD, GERD.  She reports that she has had about a 2-week history of some worsening shortness of breath.  It is worse when she walks around.  She has had some intermittent chest pain but denies any currently.  She also notes some increased abdominal distention.  She does not report any significant abdominal pain but she feels like is distended and hard.  She notes that her lower legs have been increasingly swollen which they are typically.  She was found to have low oxygen saturation of 83% on room air by her PCP.  She was placed on nasal cannula and transported here for further evaluation.  She is not on home oxygen.  She denies any known fevers.  No cough or cold symptoms.  She does say that she is having regular bowel movements.     Past Medical History:  Diagnosis Date  . Abnormal tympanic membrane    right ear and mastoid problems  . Arthritis    oseoarthritis  . Cancer of ascending colon ([place]) 02/15/2015  . Constipation, chronic   . COPD (chronic obstructive pulmonary disease) (HCC)    mild COPD  . Depression   . Diverticulosis   . GERD (gastroesophageal reflux disease)   . Glaucoma    bilateral-sugery with laser, using eye drops  . Hemorrhoids    Internal  . History of hiatal hernia    dx. '87 Schatzki's ring  . Hyperlipidemia    good control  . Hypertension   . Insomnia    hx. of  . Keratosis, actinic   . Leg cramps   . Macular degeneration   . Neuromuscular disorder (HCC)    neuropathy legs,   . On aspirin at home    chronic use  . PONV (postoperative nausea and vomiting)   . Restless leg syndrome   .  Schatzki's ring 1987  . Skin cancer    multiple, "tx. area right anterior wrist"  . Urinary incontinence    occ, stress    Patient Active Problem List   Diagnosis Date Noted  . Acute respiratory failure with hypoxia ([place]) 10/12/2019  . Malnutrition of moderate degree 08/30/2018  . Weakness 08/30/2018  . Symptomatic anemia 08/29/2018  . Acute kidney injury ([place]) 08/14/2018  . Dehydration 08/14/2018  . FTT (failure to thrive) in adult 08/14/2018  . Pressure injury of skin 08/14/2018  . Acute back pain 07/23/2018  . CKD (chronic kidney disease), stage III 07/23/2018  . Anorexia 07/23/2018  . Hyperlipidemia 07/23/2018  . Large cell lymphoma ([place]) 07/20/2018  . Metastatic cancer ([place]) 07/12/2018  . Chronic back pain 07/12/2018  . Pathologic rib fracture, initial encounter 07/12/2018  . Chest wall mass 07/12/2018  . Cancer of ascending colon s/p robotic colectomy 02/15/2015 02/15/2015  . Restless leg syndrome   . Depression   . Insomnia   . COPD (chronic obstructive pulmonary disease) ([place])   . GERD (gastroesophageal reflux disease)     Past Surgical History:  Procedure Laterality Date  . ABDOMINAL HYSTERECTOMY    . BLADDER REPAIR    . CARDIAC CATHETERIZATION  2007, 2009  .  CATARACT EXTRACTION Bilateral   . CHOLECYSTECTOMY    . COLECTOMY    . FINGER NAIL SURGERY    . FOOT SURGERY Right   . HEMORROIDECTOMY    . INNER EAR SURGERY Right    3-4 times  . INTRAOPERATIVE ARTERIOGRAM     right arm  . KNEE SURGERY Right   . LEG SURGERY Bilateral    laser ablation  . MASTOID DEBRIDEMENT     x2  . SKIN CANCER EXCISION     right/left thumb nail, left face, right ear/neck, right leg  . TOE FUSION    . TONSILLECTOMY       OB History   No obstetric history on file.      Home Medications    Prior to Admission medications   Medication Sig Start Date End Date Taking? Authorizing Provider  acetaminophen (TYLENOL) 500 MG tablet Take 1,000 mg by mouth every 6 (six) hours as  needed for mild pain.    [provider]  aspirin EC 81 MG tablet Take 81 mg by mouth every morning.    [provider]  docusate sodium (COLACE) 100 MG capsule Take 100 mg by mouth daily as needed for mild constipation.    [provider]  ENSURE (ENSURE) Take 237 mLs by mouth 2 (two) times daily between meals. VANILLA OR STRAWBERRY IF AVAILABLE PO DAILY     [provider]  fludrocortisone (FLORINEF) 0.1 MG tablet Take 0.2 mg by mouth daily.    [provider]  gemfibrozil (LOPID) 600 MG tablet Take 600 mg by mouth every morning.    [provider]  loperamide (IMODIUM) 2 MG capsule Take 1 capsule (2 mg total) by mouth as needed for diarrhea or loose stools. 09/02/18   Mariel Aloe, MD  loratadine (CLARITIN) 10 MG tablet Take 10 mg by mouth daily as needed for allergies.    [provider]  mirtazapine (REMERON) 7.5 MG tablet Take 7.5 mg by mouth at bedtime. 08/24/19   [provider]  multivitamin-lutein (OCUVITE-LUTEIN) CAPS capsule Take 1 capsule by mouth daily.    [provider]  Omega-3 Fatty Acids (FISH OIL) 1000 MG CAPS Take 2,000 mg by mouth daily with breakfast.     [provider]  ondansetron (ZOFRAN) 4 MG tablet Take 1 tablet (4 mg total) by mouth every 8 (eight) hours as needed for nausea or vomiting. 08/24/19   Ladell Pier, MD  pantoprazole (PROTONIX) 40 MG tablet Take 40 mg by mouth daily.    [provider]  polyethylene glycol (MIRALAX / GLYCOLAX) packet Take 17 g by mouth daily.     [provider]  Polyvinyl Alcohol-Povidone PF (REFRESH) 1.4-0.6 % SOLN Place 1-6 drops into both eyes daily.     [provider]  promethazine (PHENERGAN) 25 MG tablet Take 0.5 tablets (12.5 mg total) by mouth every 8 (eight) hours as needed for nausea or vomiting. 08/18/18   Tanner, Lyndon Code., PA-C  trolamine salicylate (ASPERCREME) 10 % cream Apply 1 application topically as needed  for muscle pain (as needed for back pain).    [provider]  vitamin B-12 (CYANOCOBALAMIN) 1000 MCG tablet Take 1,000 mcg by mouth daily.    [provider]  vitamin C (ASCORBIC ACID) 500 MG tablet Take 500 mg by mouth daily.    [provider]    Family History Family History  Problem Relation Age of Onset  . Diabetes Mother   . Cancer  Father   . Cancer Brother   . Cancer Brother   . Clotting disorder Child   . Hypertension Other     Social History Social History   Tobacco Use  . Smoking status: Former Research scientist (life sciences)  . Smokeless tobacco: Never Used  . Tobacco comment: short time x1 - during pregnancy  Substance Use Topics  . Alcohol use: No    Alcohol/week: 0.0 standard drinks  . Drug use: No     Allergies   Azithromycin, Allopurinol, Cephalexin, Codeine, Gabapentin, Niaspan [niacin], Oxybutynin, Paxil [paroxetine hcl], Penicillins, Pentazocine, Tizanidine, Tramadol, and Vicodin [hydrocodone-acetaminophen]   Review of Systems Review of Systems  Constitutional: Positive for fatigue. Negative for chills, diaphoresis and fever.  HENT: Negative for congestion, rhinorrhea and sneezing.   Eyes: Negative.   Respiratory: Positive for shortness of breath. Negative for cough and chest tightness.   Cardiovascular: Positive for chest pain and leg swelling.  Gastrointestinal: Positive for abdominal distention. Negative for abdominal pain, blood in stool, diarrhea, nausea and vomiting.  Genitourinary: Negative for difficulty urinating, flank pain, frequency and hematuria.  Musculoskeletal: Negative for arthralgias and back pain.  Skin: Negative for rash.  Neurological: Negative for dizziness, speech difficulty, weakness, numbness and headaches.     Physical Exam Updated Vital Signs BP 128/66   Pulse 77   Temp 98 F (36.7 C) (Oral)   Resp 18   Wt 72.6 kg   SpO2 98%   BMI 28.34 kg/m   Physical Exam Constitutional:      Appearance: She is  well-developed.  HENT:     Head: Normocephalic and atraumatic.  Eyes:     Pupils: Pupils are equal, round, and reactive to light.  Neck:     Musculoskeletal: Normal range of motion and neck supple.  Cardiovascular:     Rate and Rhythm: Normal rate and regular rhythm.     Heart sounds: Normal heart sounds.  Pulmonary:     Effort: Pulmonary effort is normal. No respiratory distress.     Breath sounds: Normal breath sounds. No wheezing or rales.  Chest:     Chest wall: No tenderness.  Abdominal:     General: Bowel sounds are normal. There is distension.     Palpations: Abdomen is soft.     Tenderness: There is no abdominal tenderness. There is no guarding or rebound.  Musculoskeletal: Normal range of motion.     Right lower leg: Edema present.     Left lower leg: Edema present.  Lymphadenopathy:     Cervical: No cervical adenopathy.  Skin:    General: Skin is warm and dry.     Findings: No rash.  Neurological:     Mental Status: She is alert and oriented to person, place, and time.      ED Treatments / Results  Labs (all labs ordered are listed, but only abnormal results are displayed) Labs Reviewed  COMPREHENSIVE METABOLIC PANEL - Abnormal; Notable for the following components:      Result Value   Potassium 3.3 (*)    Creatinine, Ser 1.08 (*)    Calcium 8.2 (*)    Total Protein 4.9 (*)    Albumin 2.7 (*)    AST 48 (*)    Alkaline Phosphatase 325 (*)    Total Bilirubin 1.6 (*)    GFR calc non Af Amer 48 (*)    GFR calc Af Amer 56 (*)    All other components within normal limits  LIPASE, BLOOD - Abnormal; Notable for  the following components:   Lipase 54 (*)    All other components within normal limits  CBC WITH DIFFERENTIAL/PLATELET - Abnormal; Notable for the following components:   WBC 2.8 (*)    RBC 3.37 (*)    Hemoglobin 11.2 (*)    HCT 33.3 (*)    Platelets 109 (*)    Lymphs Abs 0.4 (*)    All other components within normal limits  SARS CORONAVIRUS 2  (TAT 6-24 HRS)  BRAIN NATRIURETIC PEPTIDE  TROPONIN I (HIGH SENSITIVITY)  TROPONIN I (HIGH SENSITIVITY)    EKG EKG Interpretation  Date/Time:  Thursday October 12 2019 19:04:45 EDT Ventricular Rate:  77 PR Interval:    QRS Duration: 54 QT Interval:  473 QTC Calculation: 536 R Axis:   -22 Text Interpretation: Sinus rhythm Ventricular premature complex Aberrant conduction of SV complex(es) Inferior infarct, old Lateral infarct, acute Prolonged QT interval Confirmed by Malvin Johns [phone]) on 10/12/2019 7:23:57 PM   Radiology Dg Chest 2 View  Result Date: 10/12/2019 CLINICAL DATA:  Shortness of breath, exertional dyspnea for 1-2 weeks, abdominal distension lower extremity edema EXAM: CHEST - 2 VIEW COMPARISON:  Radiograph 09/11/2019, CT 12/23/2018 FINDINGS: Small to moderate right pleural effusion, similar to slightly increased in size from comparison images. Adjacent atelectasis. No convincing features of interstitial or alveolar edema. No consolidative process. Cardiomediastinal contours are similar to comparison exam. Degenerative changes are present in the imaged spine and shoulders. No acute osseous or soft tissue abnormality. IMPRESSION: Small to moderate right pleural effusion, similar to minimally increased in size from comparison images. No convincing features of interstitial or alveolar edema. Electronically Signed   By: Lovena Le M.D.   On: 10/12/2019 19:23   Ct Abdomen Pelvis W Contrast  Result Date: 10/12/2019 CLINICAL DATA:  Abdominal distension EXAM: CT ABDOMEN AND PELVIS WITH CONTRAST TECHNIQUE: Multidetector CT imaging of the abdomen and pelvis was performed using the standard protocol following bolus administration of intravenous contrast. CONTRAST:  18m OMNIPAQUE IOHEXOL 300 MG/ML  SOLN COMPARISON:  12/23/2018, 08/29/2018 FINDINGS: Lower chest: Moderate right-sided pleural effusion is noted with underlying atelectatic changes. The left lung base is clear.  Hepatobiliary: Considerable ascites is noted surrounding the liver. Changes consistent with hepatic cirrhosis are noted with recanalization of the umbilical vein. This is new from the prior exam. The fluid in the abdomen may contribute to the right-sided effusion. The gallbladder has been surgically removed. Pancreas: Unremarkable. No pancreatic ductal dilatation or surrounding inflammatory changes. Spleen: Spleen is mildly prominent consistent with the underlying cirrhosis. Adrenals/Urinary Tract: Adrenal glands are within normal limits. The left kidney shows a normal enhancement pattern. The right kidney also shows a normal enhancement pattern. Excretion is noted bilaterally. No obstructive changes are seen. The bladder is decompressed. Stomach/Bowel: Diverticular change of the colon is noted without evidence of diverticulitis. No obstructive or inflammatory changes of the colon are seen. Postsurgical changes are noted consistent with right hemicolectomy with small bowel to colon surgery. The overall appearance is similar to that seen on prior exam. Small bowel is within normal limits. The stomach shows a sliding-type hiatal hernia. Vascular/Lymphatic: Aortic atherosclerosis. No enlarged abdominal or pelvic lymph nodes. Reproductive: Status post hysterectomy. No adnexal masses. Other: Considerable ascites is identified. Mild extension of the fluid into the inguinal canals is noted bilaterally. Mild changes of anasarca are noted. Musculoskeletal: Degenerative changes of the lumbar spine are noted. No acute abnormality is seen. IMPRESSION: Changes consistent with cirrhosis of the liver with associated ascites and portal  hypertension with evidence of recanalization of the umbilical vein. These changes have increased in the interval from the prior exam. Right-sided pleural effusion which may be related to the underlying ascites. Diverticulosis without diverticulitis. Electronically Signed   By: Inez Catalina M.D.   On:  10/12/2019 21:40    Procedures Procedures (including critical care time)  Medications Ordered in ED Medications  iohexol (OMNIPAQUE) 300 MG/ML solution 100 mL (100 mLs Intravenous Contrast Given 10/12/19 2059)     Initial Impression / Assessment and Plan / ED Course  I have reviewed the triage vital signs and the nursing notes.  Pertinent labs & imaging results that were available during my care of the patient were reviewed by me and considered in my medical decision making (see chart for details).        Patient is a 83 year old female who presents with shortness of breath.  She was hypoxic on EMS arrival with oxygen saturation of 83%.  She is doing well on nasal cannula at 2 L/min.  Her chest x-ray shows a mildly increased right pleural effusion.  She has marked distention of her abdomen and lower leg edema.  CT scan shows a large amount of ascites which is likely the etiology of her shortness of breath as well.  EKG does not show any ischemic changes.  She has no ongoing chest pain.  I spoke with Dr. Posey Pronto who will admit the patient for further treatment.  Final Clinical Impressions(s) / ED Diagnoses   Final diagnoses:  None    ED Discharge Orders    None       Malvin Johns, MD 10/12/19 2205

## 2019-10-12 NOTE — ED Triage Notes (Signed)
Pt arrives via EMS from PCP. Pt reports exertional dyspnea for 1-2 weeks. Pt reports abd distension and lower extremity edema. Pt was 83% on RA at PCP.Pt placed on 2L o2 via [place]. Pt is not on O2 at baseline. Pt denies cough or fever

## 2019-10-12 NOTE — H&P (Addendum)
History and Physical    Erica Escobar FTD:[id number] DOB: [birth year] DOA: 10/12/2019  PCP: Deland Pretty, MD  Patient coming from: PCP office via EMS  I have personally briefly reviewed patient's old medical records in [place]  Chief Complaint: Dyspnea and abdominal distention  HPI: Erica Escobar is a 83 y.o. female with medical history significant for non-Hodgkin's lymphoma, colon cancer s/p right colectomy, CKD stage III, hyperlipidemia, anemia, GERD, and very hard of hearing who presents to the ED for evaluation of dyspnea.  Patient has several weeks of progressive lower extremity and abdominal swelling.  She has been having associated shortness of breath which worsens with ambulation.  She has just been feeling fatigued and generally weak.  She was seen by her PCP today and noted to have low oxygen saturation of 83% on room air.  She was placed on supplemental O2 via nasal cannula with improvement and transported to the ED via EMS for further evaluation.  She is not on home oxygen.  ED Course:  Initial vitals show BP 138/75, pulse 77, RR 19, temp 98.0 Fahrenheit, SPO2 100% on 2 L supplemental O2 via [place].  Labs are notable for sodium 142, potassium 3.3, BUN 16, creatinine 1.08, albumin 2.7, AST 48, ALT 29, alk phos 325, total bilirubin 1.6, lipase 54, WBC 2.8, hemoglobin 11.2, platelets 109,000, high-sensitivity troponin I 5, BNP 57.7.  2 view chest x-ray shows small to moderate right pleural effusion, appears increased in size from prior comparison.  CT abdomen/pelvis with contrast shows changes consistent with cirrhosis of the liver with associated ascites and portal hypertension and evidence of recannulization of the umbilical vein.  Per radiology read, these changes increased in the interval from prior exam.  Right-sided pleural effusion again is noted.  Prior right hemicolectomy postsurgical changes are noted as well.  The hospitalist service was consulted to admit for  further evaluation and management.  Review of Systems: All systems reviewed and are negative except as documented in history of present illness above.   Past Medical History:  Diagnosis Date   Abnormal tympanic membrane    right ear and mastoid problems   Arthritis    oseoarthritis   Cancer of ascending colon (HCC) 02/15/2015   Constipation, chronic    COPD (chronic obstructive pulmonary disease) (HCC)    mild COPD   Depression    Diverticulosis    GERD (gastroesophageal reflux disease)    Glaucoma    bilateral-sugery with laser, using eye drops   Hemorrhoids    Internal   History of hiatal hernia    dx. '87 Schatzki's ring   Hyperlipidemia    good control   Hypertension    Insomnia    hx. of   Keratosis, actinic    Leg cramps    Macular degeneration    Neuromuscular disorder (HCC)    neuropathy legs,    On aspirin at home    chronic use   PONV (postoperative nausea and vomiting)    Restless leg syndrome    Schatzki's ring 1987   Skin cancer    multiple, "tx. area right anterior wrist"   Urinary incontinence    occ, stress    Past Surgical History:  Procedure Laterality Date   ABDOMINAL HYSTERECTOMY     BLADDER REPAIR     CARDIAC CATHETERIZATION  2007, 2009   CATARACT EXTRACTION Bilateral    CHOLECYSTECTOMY     COLECTOMY     FINGER NAIL SURGERY  FOOT SURGERY Right    HEMORROIDECTOMY     INNER EAR SURGERY Right    3-4 times   INTRAOPERATIVE ARTERIOGRAM     right arm   KNEE SURGERY Right    LEG SURGERY Bilateral    laser ablation   MASTOID DEBRIDEMENT     x2   SKIN CANCER EXCISION     right/left thumb nail, left face, right ear/neck, right leg   TOE FUSION     TONSILLECTOMY      Social History:  reports that she has quit smoking. She has never used smokeless tobacco. She reports that she does not drink alcohol or use drugs.  Allergies  Allergen Reactions   Azithromycin Itching   Allopurinol Other  (See Comments)    Unknown   Cephalexin Other (See Comments)    Unknown   Codeine Itching   Gabapentin Swelling and Other (See Comments)    Throat and leg swelling   Niaspan [Niacin] Other (See Comments)    Unknown   Oxybutynin Other (See Comments)    "made me go crazy"-- had crazy dreams.    Paxil [Paroxetine Hcl] Other (See Comments)    Unknown reaction   Penicillins Itching and Other (See Comments)    Has patient had a PCN reaction causing immediate rash, facial/tongue/throat swelling, SOB or lightheadedness with hypotension: No Has patient had a PCN reaction causing severe rash involving mucus membranes or skin necrosis: Yes Has patient had a PCN reaction that required hospitalization: No Has patient had a PCN reaction occurring within the last 10 years: No If all of the above answers are "NO", then may proceed with Cephalosporin use.    Pentazocine Itching, Nausea Only and Other (See Comments)    Headache.    Tizanidine Other (See Comments)    "made me crazy"   Tramadol Other (See Comments)    Head feels "swimmy" and weakness   Vicodin [Hydrocodone-Acetaminophen] Other (See Comments)    "made me go crazy"    Family History  Problem Relation Age of Onset   Diabetes Mother    Cancer Father    Cancer Brother    Cancer Brother    Clotting disorder Child    Hypertension Other      Prior to Admission medications   Medication Sig Start Date End Date Taking? Authorizing Provider  acetaminophen (TYLENOL) 500 MG tablet Take 1,000 mg by mouth every 6 (six) hours as needed for mild pain.    [provider]  aspirin EC 81 MG tablet Take 81 mg by mouth every morning.    [provider]  docusate sodium (COLACE) 100 MG capsule Take 100 mg by mouth daily as needed for mild constipation.    [provider]  ENSURE (ENSURE) Take 237 mLs by mouth 2 (two) times daily between meals. VANILLA OR STRAWBERRY IF AVAILABLE PO DAILY     [provider]  fludrocortisone (FLORINEF) 0.1 MG tablet Take 0.2 mg by mouth daily.    [provider]  gemfibrozil (LOPID) 600 MG tablet Take 600 mg by mouth every morning.    [provider]  loperamide (IMODIUM) 2 MG capsule Take 1 capsule (2 mg total) by mouth as needed for diarrhea or loose stools. 09/02/18   Mariel Aloe, MD  loratadine (CLARITIN) 10 MG tablet Take 10 mg by mouth daily as needed for allergies.    [provider]  mirtazapine (REMERON) 7.5 MG tablet Take 7.5 mg by mouth at bedtime. 08/24/19  [provider]  multivitamin-lutein (OCUVITE-LUTEIN) CAPS capsule Take 1 capsule by mouth daily.    [provider]  Omega-3 Fatty Acids (FISH OIL) 1000 MG CAPS Take 2,000 mg by mouth daily with breakfast.     [provider]  ondansetron (ZOFRAN) 4 MG tablet Take 1 tablet (4 mg total) by mouth every 8 (eight) hours as needed for nausea or vomiting. 08/24/19   Ladell Pier, MD  pantoprazole (PROTONIX) 40 MG tablet Take 40 mg by mouth daily.    [provider]  polyethylene glycol (MIRALAX / GLYCOLAX) packet Take 17 g by mouth daily.     [provider]  Polyvinyl Alcohol-Povidone PF (REFRESH) 1.4-0.6 % SOLN Place 1-6 drops into both eyes daily.     [provider]  promethazine (PHENERGAN) 25 MG tablet Take 0.5 tablets (12.5 mg total) by mouth every 8 (eight) hours as needed for nausea or vomiting. 08/18/18   Tanner, Lyndon Code., PA-C  trolamine salicylate (ASPERCREME) 10 % cream Apply 1 application topically as needed for muscle pain (as needed for back pain).    [provider]  vitamin B-12 (CYANOCOBALAMIN) 1000 MCG tablet Take 1,000 mcg by mouth daily.    [provider]  vitamin C (ASCORBIC ACID) 500 MG tablet Take 500 mg by mouth daily.    [provider]    Physical Exam: Vitals:   10/12/19 1830 10/12/19 1900 10/12/19 2030 10/12/19 2230  BP: (!) 140/105 138/75 128/66 (!)  166/91  Pulse: 77 77 77 83  Resp: (!) _0 Temp:      TempSrc:      SpO2: 100% 100% 98% 97%  Weight:        Constitutional: Elderly woman resting supine in bed, NAD, calm, comfortable Eyes: PERRL, lids and conjunctivae normal ENMT: Mucous membranes are moist. Posterior pharynx clear of any exudate or lesions.very hard of hearing. Neck: normal, supple, no masses. Respiratory: Diminished breath sounds right lung base otherwise clear to auscultation.  Normal respiratory effort. No accessory muscle use.  Cardiovascular: Regular rate and rhythm, no murmurs / rubs / gallops.  Tight nonpitting edema of both lower extremities bilaterally. Abdomen: Distended abdomen with tense ascites, no tenderness, no masses palpated. Bowel sounds positive.  Musculoskeletal: no clubbing / cyanosis.  Osteoarthritic changes of both hands.No contractures. Normal muscle tone.  Skin: no rashes, lesions, ulcers. No induration Neurologic: CN 2-12 grossly intact. Sensation intact, moving all extremities. Psychiatric: Normal judgment and insight. Alert and oriented x 3. Normal mood.   Labs on Admission: I have personally reviewed following labs and imaging studies  CBC: Recent Labs  Lab 10/12/19 1850  WBC 2.8*  NEUTROABS 1.8  HGB 11.2*  HCT 33.3*  MCV 98.8  PLT 962*   Basic Metabolic Panel: Recent Labs  Lab 10/12/19 1850 10/12/19 2050  NA 142  --   K 3.3*  --   CL 109  --   CO2 25  --   GLUCOSE 95  --   BUN 16  --   CREATININE 1.08*  --   CALCIUM 8.2*  --   MG  --  1.7   GFR: Estimated Creatinine Clearance: 39 mL/min (A) (by C-G formula based on SCr of 1.08 mg/dL (H)). Liver Function Tests: Recent Labs  Lab 10/12/19 1850  AST 48*  ALT 29  ALKPHOS 325*  BILITOT 1.6*  PROT 4.9*  ALBUMIN 2.7*   Recent Labs  Lab 10/12/19 1850  LIPASE 54*  No results for input(s): AMMONIA in the last 168 hours. Coagulation Profile: No results for input(s): INR, PROTIME in the last 168  hours. Cardiac Enzymes: No results for input(s): CKTOTAL, CKMB, CKMBINDEX, TROPONINI in the last 168 hours. BNP (last 3 results) No results for input(s): PROBNP in the last 8760 hours. HbA1C: No results for input(s): HGBA1C in the last 72 hours. CBG: No results for input(s): GLUCAP in the last 168 hours. Lipid Profile: No results for input(s): CHOL, HDL, LDLCALC, TRIG, CHOLHDL, LDLDIRECT in the last 72 hours. Thyroid Function Tests: No results for input(s): TSH, T4TOTAL, FREET4, T3FREE, THYROIDAB in the last 72 hours. Anemia Panel: No results for input(s): VITAMINB12, FOLATE, FERRITIN, TIBC, IRON, RETICCTPCT in the last 72 hours. Urine analysis:    Component Value Date/Time   COLORURINE YELLOW 08/28/2018 0753   APPEARANCEUR HAZY (A) 08/28/2018 0753   LABSPEC 1.016 08/28/2018 0753   PHURINE 5.0 08/28/2018 0753   GLUCOSEU NEGATIVE 08/28/2018 0753   HGBUR NEGATIVE 08/28/2018 0753   BILIRUBINUR NEGATIVE 08/28/2018 0753   KETONESUR NEGATIVE 08/28/2018 0753   PROTEINUR NEGATIVE 08/28/2018 0753   UROBILINOGEN 0.2 03/10/2009 1608   NITRITE NEGATIVE 08/28/2018 0753   LEUKOCYTESUR NEGATIVE 08/28/2018 0753    Radiological Exams on Admission: Dg Chest 2 View  Result Date: 10/12/2019 CLINICAL DATA:  Shortness of breath, exertional dyspnea for 1-2 weeks, abdominal distension lower extremity edema EXAM: CHEST - 2 VIEW COMPARISON:  Radiograph 09/11/2019, CT 12/23/2018 FINDINGS: Small to moderate right pleural effusion, similar to slightly increased in size from comparison images. Adjacent atelectasis. No convincing features of interstitial or alveolar edema. No consolidative process. Cardiomediastinal contours are similar to comparison exam. Degenerative changes are present in the imaged spine and shoulders. No acute osseous or soft tissue abnormality. IMPRESSION: Small to moderate right pleural effusion, similar to minimally increased in size from comparison images. No convincing features of  interstitial or alveolar edema. Electronically Signed   By: Lovena Le M.D.   On: 10/12/2019 19:23   Ct Abdomen Pelvis W Contrast  Result Date: 10/12/2019 CLINICAL DATA:  Abdominal distension EXAM: CT ABDOMEN AND PELVIS WITH CONTRAST TECHNIQUE: Multidetector CT imaging of the abdomen and pelvis was performed using the standard protocol following bolus administration of intravenous contrast. CONTRAST:  166m OMNIPAQUE IOHEXOL 300 MG/ML  SOLN COMPARISON:  12/23/2018, 08/29/2018 FINDINGS: Lower chest: Moderate right-sided pleural effusion is noted with underlying atelectatic changes. The left lung base is clear. Hepatobiliary: Considerable ascites is noted surrounding the liver. Changes consistent with hepatic cirrhosis are noted with recanalization of the umbilical vein. This is new from the prior exam. The fluid in the abdomen may contribute to the right-sided effusion. The gallbladder has been surgically removed. Pancreas: Unremarkable. No pancreatic ductal dilatation or surrounding inflammatory changes. Spleen: Spleen is mildly prominent consistent with the underlying cirrhosis. Adrenals/Urinary Tract: Adrenal glands are within normal limits. The left kidney shows a normal enhancement pattern. The right kidney also shows a normal enhancement pattern. Excretion is noted bilaterally. No obstructive changes are seen. The bladder is decompressed. Stomach/Bowel: Diverticular change of the colon is noted without evidence of diverticulitis. No obstructive or inflammatory changes of the colon are seen. Postsurgical changes are noted consistent with right hemicolectomy with small bowel to colon surgery. The overall appearance is similar to that seen on prior exam. Small bowel is within normal limits. The stomach shows a sliding-type hiatal hernia. Vascular/Lymphatic: Aortic atherosclerosis. No enlarged abdominal or pelvic lymph nodes. Reproductive: Status post hysterectomy. No adnexal masses. Other: Considerable  ascites is identified. Mild extension of the fluid into the inguinal canals is noted bilaterally. Mild changes of anasarca are noted. Musculoskeletal: Degenerative changes of the lumbar spine are noted. No acute abnormality is seen. IMPRESSION: Changes consistent with cirrhosis of the liver with associated ascites and portal hypertension with evidence of recanalization of the umbilical vein. These changes have increased in the interval from the prior exam. Right-sided pleural effusion which may be related to the underlying ascites. Diverticulosis without diverticulitis. Electronically Signed   By: Inez Catalina M.D.   On: 10/12/2019 21:40    EKG: Independently reviewed. Sinus rhythm with low voltage, QTC 536.  When compared to prior QTC is prolonged and voltage is new.  Assessment/Plan Principal Problem:   Acute respiratory failure with hypoxia (HCC) Active Problems:   Cancer of ascending colon s/p robotic colectomy 02/15/2015   CKD (chronic kidney disease), stage III   Hyperlipidemia   Pleural effusion on right   Cirrhosis of liver with ascites ([place])   Pancytopenia ([place])  Erica Escobar is a 83 y.o. female with medical history significant for non-Hodgkin's lymphoma, colon cancer s/p right colectomy, CKD stage III, hyperlipidemia, anemia, GERD, and very hard of hearing who is admitted with acute respiratory failure with hypoxia in setting of right sided pleural effusion and hepatic cirrhosis with ascites.   Acute respiratory failure with hypoxia with associated right-sided pleural effusion: Slightly increasing right pleural effusion when compared to prior imaging.  She also likely has diminished inspirations due to her abdominal ascites. -Trial IV Lasix diuresis -Continue supplemental O2 as needed  Hepatic cirrhosis with ascites and CT evidence of portal hypertension: New finding on CT this admission.  She is having difficulty with full inspiration due to her ascites. -Trial IV Lasix as  above -Ultrasound paracentesis ordered with labs for tomorrow  Non-Hodgkin's lymphoma and history of colon cancer s/p right colectomy: Follows with oncology, Dr. Benay Spice.  Per prior notes, she is considered to be in remission from non-Hodgkin's lymphoma.  Hypokalemia: Replete and recheck in a.m.  Pancytopenia: Currently stable without significant changes relative to recent labs.  Continue monitor.  CKD stage III: Appears stable, continue to monitor.  Hyperlipidemia: Continue gemfibrozil.  Orthostasis: Has been on Florinef per PCP for orthostasis as an outpatient.  Will continue.   DVT prophylaxis: SCDs Code Status: DNR, confirmed with patient Family Communication: Attempted to call patient's son Alveda Reasons at [phone] as listed under demographics without answer. Disposition Plan: Pending clinical progress Consults called: None Admission status: Admit - It is my clinical opinion that admission to INPATIENT is reasonable and necessary because of the expectation that this patient will require hospital care that crosses at least 2 midnights to treat this condition based on the medical complexity of the problems presented.  Given the aforementioned information, the predictability of an adverse outcome is felt to be significant.    Zada Finders MD [facility]  If 7PM-7AM, please contact night-coverage [email or web address]  10/12/2019, 11:00 PM

## 2019-10-13 ENCOUNTER — Encounter (HOSPITAL_COMMUNITY): Payer: Self-pay | Admitting: *Deleted

## 2019-10-13 ENCOUNTER — Inpatient Hospital Stay (HOSPITAL_COMMUNITY): Payer: Medicare Other

## 2019-10-13 DIAGNOSIS — I361 Nonrheumatic tricuspid (valve) insufficiency: Secondary | ICD-10-CM | POA: Diagnosis not present

## 2019-10-13 LAB — COMPREHENSIVE METABOLIC PANEL
ALT: 25 U/L (ref 0–44)
AST: 42 U/L — ABNORMAL HIGH (ref 15–41)
Albumin: 2.3 g/dL — ABNORMAL LOW (ref 3.5–5.0)
Alkaline Phosphatase: 271 U/L — ABNORMAL HIGH (ref 38–126)
Anion gap: 7 (ref 5–15)
BUN: 15 mg/dL (ref 8–23)
CO2: 24 mmol/L (ref 22–32)
Calcium: 8 mg/dL — ABNORMAL LOW (ref 8.9–10.3)
Chloride: 110 mmol/L (ref 98–111)
Creatinine, Ser: 0.98 mg/dL (ref 0.44–1.00)
GFR calc Af Amer: >60 mL/min (ref >60)
GFR calc non Af Amer: 54 mL/min — ABNORMAL LOW (ref >60)
Glucose, Bld: 87 mg/dL (ref 70–99)
Potassium: 3.7 mmol/L (ref 3.5–5.1)
Sodium: 141 mmol/L (ref 135–145)
Total Bilirubin: 1.3 mg/dL — ABNORMAL HIGH (ref 0.3–1.2)
Total Protein: 4.3 g/dL — ABNORMAL LOW (ref 6.5–8.1)

## 2019-10-13 LAB — BODY FLUID CELL COUNT WITH DIFFERENTIAL
Eos, Fluid: 0 %
Lymphs, Fluid: 40 %
Monocyte-Macrophage-Serous Fluid: 55 % (ref 50–90)
Neutrophil Count, Fluid: 5 % (ref 0–25)
Total Nucleated Cell Count, Fluid: 58 cu mm (ref 0–1000)

## 2019-10-13 LAB — ECHOCARDIOGRAM COMPLETE
Height: 63 in
Weight: 2698.43 oz

## 2019-10-13 LAB — PROTEIN, PLEURAL OR PERITONEAL FLUID: Total protein, fluid: <3 g/dL

## 2019-10-13 LAB — LACTATE DEHYDROGENASE, PLEURAL OR PERITONEAL FLUID: LD, Fluid: 38 U/L — ABNORMAL HIGH (ref 3–23)

## 2019-10-13 LAB — TSH: TSH: 2.244 u[IU]/mL (ref 0.350–4.500)

## 2019-10-13 LAB — PROTIME-INR
INR: 1 (ref 0.8–1.2)
Prothrombin Time: 13.4 seconds (ref 11.4–15.2)

## 2019-10-13 LAB — CBC
HCT: 27.9 % — ABNORMAL LOW (ref 36.0–46.0)
Hemoglobin: 9.7 g/dL — ABNORMAL LOW (ref 12.0–15.0)
MCH: 34.2 pg — ABNORMAL HIGH (ref 26.0–34.0)
MCHC: 34.8 g/dL (ref 30.0–36.0)
MCV: 98.2 fL (ref 80.0–100.0)
Platelets: 82 10*3/uL — ABNORMAL LOW (ref 150–400)
RBC: 2.84 MIL/uL — ABNORMAL LOW (ref 3.87–5.11)
RDW: 15.2 % (ref 11.5–15.5)
WBC: 2.3 10*3/uL — ABNORMAL LOW (ref 4.0–10.5)
nRBC: 0 % (ref 0.0–0.2)

## 2019-10-13 LAB — GRAM STAIN

## 2019-10-13 LAB — LACTATE DEHYDROGENASE: LDH: 152 U/L (ref 98–192)

## 2019-10-13 LAB — GLUCOSE, PLEURAL OR PERITONEAL FLUID: Glucose, Fluid: 100 mg/dL

## 2019-10-13 LAB — SARS CORONAVIRUS 2 (TAT 6-24 HRS): SARS Coronavirus 2: NEGATIVE

## 2019-10-13 MED ORDER — SPIRONOLACTONE 25 MG PO TABS
100.0000 mg | ORAL_TABLET | Freq: Every day | ORAL | Status: DC
  Administered 2019-10-13 – 2019-10-17 (×5): 100 mg via ORAL
  Filled 2019-10-13 (×6): qty 4

## 2019-10-13 MED ORDER — LIDOCAINE HCL 1 % IJ SOLN
INTRAMUSCULAR | Status: AC
  Filled 2019-10-13: qty 10

## 2019-10-13 MED ORDER — POTASSIUM CHLORIDE CRYS ER 20 MEQ PO TBCR
20.0000 meq | EXTENDED_RELEASE_TABLET | Freq: Two times a day (BID) | ORAL | Status: DC
  Administered 2019-10-13 – 2019-10-14 (×3): 20 meq via ORAL
  Filled 2019-10-13 (×2): qty 1

## 2019-10-13 MED ORDER — OXYCODONE HCL 5 MG PO TABS
5.0000 mg | ORAL_TABLET | Freq: Four times a day (QID) | ORAL | Status: DC | PRN
  Administered 2019-10-13 – 2019-10-17 (×5): 5 mg via ORAL
  Filled 2019-10-13 (×5): qty 1

## 2019-10-13 NOTE — Procedures (Signed)
Ultrasound-guided diagnostic and therapeutic paracentesis performed yielding 4.1 liters of slightly hazy, yellow fluid. No immediate complications.  A portion of the fluid was submitted to the lab for preordered studies.EBL none.

## 2019-10-13 NOTE — Progress Notes (Signed)
  Echocardiogram 2D Echocardiogram has been performed.  Matilde Bash 10/13/2019, 3:34 PM

## 2019-10-13 NOTE — Progress Notes (Signed)
Case discussed with Dr. Benson Norway who has seen her as outpatient.  2D echocardiogram is normal.  Patient probably has chronic liver disease.  Since patient is not needing any inpatient procedure, we will mobilize her after the procedure and monitor for oxygen requirement.  If patient is fairly stable and diuresed well, he recommended patient to be discharged on Lasix 40 mg p.o. daily, Aldactone 100 mg p.o. daily and their office will schedule follow-up on Monday morning.

## 2019-10-13 NOTE — Progress Notes (Signed)
PROGRESS NOTE    Erica Escobar  MCE:[id number] DOB: 1936/06/16 DOA: 10/12/2019 PCP: Deland Pretty, MD    Brief Narrative:  83 year old female with history of non-Hodgkin's lymphoma, colon cancer status post right colectomy, stage III chronic kidney disease, hyperlipidemia, anemia and GERD presented to the emergency room for evaluation of dyspnea.  Patient has several weeks of progressive lower extremity and abdominal swelling.  She has been having shortness of breath.  Most of the symptoms worsening over the last 1 month.  She was seen by her PCP noted to be hypoxic with saturation 83% on room air and sent to ER. In the emergency room, hemodynamically stable.  Needed 2 L oxygen with saturation 100%.  LFTs and renal function test are normal with elevated alkaline phosphatase of 325.  Bilirubin is 1.6.  WBC 2.8, platelets 109K.  Assessment & Plan:   Principal Problem:   Acute respiratory failure with hypoxia (HCC) Active Problems:   Cancer of ascending colon s/p robotic colectomy 02/15/2015   CKD (chronic kidney disease), stage III   Hyperlipidemia   Pleural effusion on right   Cirrhosis of liver with ascites (HCC)   Pancytopenia (HCC)  Acute respiratory failure with hypoxia: Due to ascites and fluid overload probably due to hypoalbuminemia and primary liver disease.  She does have minimal pleural effusion, may not benefit with thoracentesis.  She will benefit with paracentesis.  Keep on oxygen to keep saturation more than 90%.  Anasarca/new onset ascites with evidence of chronic liver disease on CT scan: Paracentesis today.  Will check labs including cytology.  Preliminary results with no evidence of infection and transudative effusion. Trial of diuresis.  Close monitoring of electrolytes.   LFTs are normal.  INR is normal.  Also check echocardiogram given history of cancer and chemotherapy. Patient may need further work-up including autoimmune diseases.  Called and discussed case with  gastroenterologist, will defer to them for further testing.  Non-Hodgkin's lymphoma and history of colon cancer status post right colectomy: Followed by oncology Dr. Benay Spice.  Reportedly on remission.  Hypokalemia: Replaced and normalized.  Will keep on a schedule replacement.  Orthostatic hypotension: Recently started on Florinef.  Continue.  Pancytopenia: Probably related to previous chemotherapy or chronic liver disease.  Stable.  DVT prophylaxis: SCDs Code Status: DNR Family Communication: Patient's son, called and updated. Disposition Plan: Unknown.  Still an active treatment.  Work with PT OT.   Consultants:   Gastroenterology.  Procedures:   Paracentesis  Antimicrobials:   None   Subjective: Patient seen and examined.  Very hard of hearing.  Was worried about abdominal distention ongoing for about a month.  Gets short of breath on walking.  She is okay on rest.  Has dull mild abdominal pain.  She does use occasional Tylenol.  Objective: Vitals:   10/13/19 1020 10/13/19 1100 10/13/19 1257 10/13/19 1257  BP: (!) 141/70  122/69 122/69  Pulse:   82 79  Resp:   15 15  Temp:   98.4 F (36.9 C) 98.4 F (36.9 C)  TempSrc:   Oral Oral  SpO2:  100% 98% 97%  Weight:      Height:        Intake/Output Summary (Last 24 hours) at 10/13/2019 1430 Last data filed at 10/13/2019 1100 Gross per 24 hour  Intake 310 ml  Output 900 ml  Net -590 ml   Filed Weights   10/12/19 1822 10/12/19 2357 10/13/19 0503  Weight: 72.6 kg 75.4 kg 76.5 kg  Examination:  General exam: Appears calm and comfortable, chronically sick looking.  On 2 L oxygen. Respiratory system: Clear to auscultation. Respiratory effort normal.  No added sounds. Cardiovascular system: S1 & S2 heard, RRR. No JVD, murmurs, rubs, gallops or clicks.  2+ pedal edema mostly on the dorsum of the foot.  Gastrointestinal system: Abdomen is distended but not tender.  No organomegaly or masses felt. Normal bowel  sounds heard. Central nervous system: Alert and oriented. No focal neurological deficits. Extremities: Symmetric 5 x 5 power. Skin: No rashes, lesions or ulcers Psychiatry: Judgement and insight appear normal. Mood & affect appropriate.     Data Reviewed: I have personally reviewed following labs and imaging studies  CBC: Recent Labs  Lab 10/12/19 1850 10/13/19 0453  WBC 2.8* 2.3*  NEUTROABS 1.8  --   HGB 11.2* 9.7*  HCT 33.3* 27.9*  MCV 98.8 98.2  PLT 109* 82*   Basic Metabolic Panel: Recent Labs  Lab 10/12/19 1850 10/12/19 2050 10/13/19 0453  NA 142  --  141  K 3.3*  --  3.7  CL 109  --  110  CO2 25  --  24  GLUCOSE 95  --  87  BUN 16  --  15  CREATININE 1.08*  --  0.98  CALCIUM 8.2*  --  8.0*  MG  --  1.7  --    GFR: Estimated Creatinine Clearance: 44.1 mL/min (by C-G formula based on SCr of 0.98 mg/dL). Liver Function Tests: Recent Labs  Lab 10/12/19 1850 10/13/19 0453  AST 48* 42*  ALT 29 25  ALKPHOS 325* 271*  BILITOT 1.6* 1.3*  PROT 4.9* 4.3*  ALBUMIN 2.7* 2.3*   Recent Labs  Lab 10/12/19 1850  LIPASE 54*   No results for input(s): AMMONIA in the last 168 hours. Coagulation Profile: Recent Labs  Lab 10/13/19 0453  INR 1.0   Cardiac Enzymes: No results for input(s): CKTOTAL, CKMB, CKMBINDEX, TROPONINI in the last 168 hours. BNP (last 3 results) No results for input(s): PROBNP in the last 8760 hours. HbA1C: No results for input(s): HGBA1C in the last 72 hours. CBG: No results for input(s): GLUCAP in the last 168 hours. Lipid Profile: No results for input(s): CHOL, HDL, LDLCALC, TRIG, CHOLHDL, LDLDIRECT in the last 72 hours. Thyroid Function Tests: Recent Labs    10/13/19 0453  TSH 2.244   Anemia Panel: No results for input(s): VITAMINB12, FOLATE, FERRITIN, TIBC, IRON, RETICCTPCT in the last 72 hours. Sepsis Labs: No results for input(s): PROCALCITON, LATICACIDVEN in the last 168 hours.  Recent Results (from the past 240  hour(s))  SARS CORONAVIRUS 2 (TAT 6-24 HRS) Nasopharyngeal Nasopharyngeal Swab     Status: None   Collection Time: 10/12/19  9:56 PM   Specimen: Nasopharyngeal Swab  Result Value Ref Range Status   SARS Coronavirus 2 NEGATIVE NEGATIVE Final    Comment: (NOTE) SARS-CoV-2 target nucleic acids are NOT DETECTED. The SARS-CoV-2 RNA is generally detectable in upper and lower respiratory specimens during the acute phase of infection. Negative results do not preclude SARS-CoV-2 infection, do not rule out co-infections with other pathogens, and should not be used as the sole basis for treatment or other patient management decisions. Negative results must be combined with clinical observations, patient history, and epidemiological information. The expected result is Negative. Fact Sheet for Patients: SugarRoll.be Fact Sheet for Healthcare Providers: https://www.woods-mathews.com/ This test is not yet approved or cleared by the [place] FDA and  has been authorized for detection and/or  diagnosis of SARS-CoV-2 by FDA under an Emergency Use Authorization (EUA). This EUA will remain  in effect (meaning this test can be used) for the duration of the COVID-19 declaration under Section 56 4(b)(1) of the Act, 21 U.S.C. section 360bbb-3(b)(1), unless the authorization is terminated or revoked sooner. Performed at [facility], [place] [address], [place], [place] [address]          Radiology Studies: Dg Chest 2 View  Result Date: 10/12/2019 CLINICAL DATA:  Shortness of breath, exertional dyspnea for 1-2 weeks, abdominal distension lower extremity edema EXAM: CHEST - 2 VIEW COMPARISON:  Radiograph 09/11/2019, CT 12/23/2018 FINDINGS: Small to moderate right pleural effusion, similar to slightly increased in size from comparison images. Adjacent atelectasis. No convincing features of interstitial or alveolar edema. No consolidative process.  Cardiomediastinal contours are similar to comparison exam. Degenerative changes are present in the imaged spine and shoulders. No acute osseous or soft tissue abnormality. IMPRESSION: Small to moderate right pleural effusion, similar to minimally increased in size from comparison images. No convincing features of interstitial or alveolar edema. Electronically Signed   By: Lovena Le M.D.   On: 10/12/2019 19:23   Ct Abdomen Pelvis W Contrast  Result Date: 10/12/2019 CLINICAL DATA:  Abdominal distension EXAM: CT ABDOMEN AND PELVIS WITH CONTRAST TECHNIQUE: Multidetector CT imaging of the abdomen and pelvis was performed using the standard protocol following bolus administration of intravenous contrast. CONTRAST:  112m OMNIPAQUE IOHEXOL 300 MG/ML  SOLN COMPARISON:  12/23/2018, 08/29/2018 FINDINGS: Lower chest: Moderate right-sided pleural effusion is noted with underlying atelectatic changes. The left lung base is clear. Hepatobiliary: Considerable ascites is noted surrounding the liver. Changes consistent with hepatic cirrhosis are noted with recanalization of the umbilical vein. This is new from the prior exam. The fluid in the abdomen may contribute to the right-sided effusion. The gallbladder has been surgically removed. Pancreas: Unremarkable. No pancreatic ductal dilatation or surrounding inflammatory changes. Spleen: Spleen is mildly prominent consistent with the underlying cirrhosis. Adrenals/Urinary Tract: Adrenal glands are within normal limits. The left kidney shows a normal enhancement pattern. The right kidney also shows a normal enhancement pattern. Excretion is noted bilaterally. No obstructive changes are seen. The bladder is decompressed. Stomach/Bowel: Diverticular change of the colon is noted without evidence of diverticulitis. No obstructive or inflammatory changes of the colon are seen. Postsurgical changes are noted consistent with right hemicolectomy with small bowel to colon surgery. The  overall appearance is similar to that seen on prior exam. Small bowel is within normal limits. The stomach shows a sliding-type hiatal hernia. Vascular/Lymphatic: Aortic atherosclerosis. No enlarged abdominal or pelvic lymph nodes. Reproductive: Status post hysterectomy. No adnexal masses. Other: Considerable ascites is identified. Mild extension of the fluid into the inguinal canals is noted bilaterally. Mild changes of anasarca are noted. Musculoskeletal: Degenerative changes of the lumbar spine are noted. No acute abnormality is seen. IMPRESSION: Changes consistent with cirrhosis of the liver with associated ascites and portal hypertension with evidence of recanalization of the umbilical vein. These changes have increased in the interval from the prior exam. Right-sided pleural effusion which may be related to the underlying ascites. Diverticulosis without diverticulitis. Electronically Signed   By: MInez CatalinaM.D.   On: 10/12/2019 21:40   [place]  Result Date: 10/13/2019 INDICATION: Patient with prior history of non-Hodgkin's lymphoma, colon cancer with prior right colectomy, chronic kidney disease, cirrhosis by imaging, ascites; request received for diagnostic and therapeutic paracentesis. EXAM: ULTRASOUND GUIDED DIAGNOSTIC AND THERAPEUTIC PARACENTESIS MEDICATIONS:  None COMPLICATIONS: None immediate. PROCEDURE: Informed written consent was obtained from the patient after a discussion of the risks, benefits and alternatives to treatment. A timeout was performed prior to the initiation of the procedure. Initial ultrasound scanning demonstrates a moderate to large amount of ascites within the right lower abdominal quadrant. The right lower abdomen was prepped and draped in the usual sterile fashion. 1% lidocaine was used for local anesthesia. Following this, a 19 gauge, 10-cm, Yueh catheter was introduced. An ultrasound image was saved for documentation purposes. The paracentesis was performed. The  catheter was removed and a dressing was applied. The patient tolerated the procedure well without immediate post procedural complication. FINDINGS: A total of approximately 4.1 liters of slightly hazy, yellow fluid was removed. Samples were sent to the laboratory as requested by the clinical team. IMPRESSION: Successful ultrasound-guided diagnostic and therapeutic paracentesis yielding 4.1 liters of peritoneal fluid. Read by: Rowe Robert, PA-C Electronically Signed   By: Aletta Edouard M.D.   On: 10/13/2019 12:33        Scheduled Meds:  feeding supplement (ENSURE ENLIVE)  237 mL Oral Daily   fludrocortisone  0.2 mg Oral Daily   furosemide  20 mg Intravenous Q12H   gemfibrozil  600 mg Oral q morning - 10a   lidocaine       mirtazapine  7.5 mg Oral QHS   multivitamin with minerals  1 tablet Oral Daily   pantoprazole  40 mg Oral Daily   sodium chloride flush  3 mL Intravenous Q12H   vitamin B-12  1,000 mcg Oral Daily   Continuous Infusions:   LOS: 1 day    Time spent: 30 minutes    Barb Merino, MD [facility] Pager [phone]

## 2019-10-14 LAB — COMPREHENSIVE METABOLIC PANEL
ALT: 24 U/L (ref 0–44)
AST: 41 U/L (ref 15–41)
Albumin: 2.3 g/dL — ABNORMAL LOW (ref 3.5–5.0)
Alkaline Phosphatase: 271 U/L — ABNORMAL HIGH (ref 38–126)
Anion gap: 9 (ref 5–15)
BUN: 14 mg/dL (ref 8–23)
CO2: 25 mmol/L (ref 22–32)
Calcium: 7.7 mg/dL — ABNORMAL LOW (ref 8.9–10.3)
Chloride: 107 mmol/L (ref 98–111)
Creatinine, Ser: 0.87 mg/dL (ref 0.44–1.00)
GFR calc Af Amer: >60 mL/min (ref >60)
GFR calc non Af Amer: >60 mL/min (ref >60)
Glucose, Bld: 96 mg/dL (ref 70–99)
Potassium: 3.1 mmol/L — ABNORMAL LOW (ref 3.5–5.1)
Sodium: 141 mmol/L (ref 135–145)
Total Bilirubin: 1.4 mg/dL — ABNORMAL HIGH (ref 0.3–1.2)
Total Protein: 4.3 g/dL — ABNORMAL LOW (ref 6.5–8.1)

## 2019-10-14 LAB — PHOSPHORUS: Phosphorus: 2.3 mg/dL — ABNORMAL LOW (ref 2.5–4.6)

## 2019-10-14 LAB — CBC WITH DIFFERENTIAL/PLATELET
Abs Immature Granulocytes: 0.01 10*3/uL (ref 0.00–0.07)
Basophils Absolute: 0 10*3/uL (ref 0.0–0.1)
Basophils Relative: 0 %
Eosinophils Absolute: 0.1 10*3/uL (ref 0.0–0.5)
Eosinophils Relative: 3 %
HCT: 30.9 % — ABNORMAL LOW (ref 36.0–46.0)
Hemoglobin: 10.4 g/dL — ABNORMAL LOW (ref 12.0–15.0)
Immature Granulocytes: 0 %
Lymphocytes Relative: 15 %
Lymphs Abs: 0.4 10*3/uL — ABNORMAL LOW (ref 0.7–4.0)
MCH: 33.5 pg (ref 26.0–34.0)
MCHC: 33.7 g/dL (ref 30.0–36.0)
MCV: 99.7 fL (ref 80.0–100.0)
Monocytes Absolute: 0.6 10*3/uL (ref 0.1–1.0)
Monocytes Relative: 23 %
Neutro Abs: 1.5 10*3/uL — ABNORMAL LOW (ref 1.7–7.7)
Neutrophils Relative %: 59 %
Platelets: 92 10*3/uL — ABNORMAL LOW (ref 150–400)
RBC: 3.1 MIL/uL — ABNORMAL LOW (ref 3.87–5.11)
RDW: 15.1 % (ref 11.5–15.5)
WBC: 2.6 10*3/uL — ABNORMAL LOW (ref 4.0–10.5)
nRBC: 0 % (ref 0.0–0.2)

## 2019-10-14 LAB — MAGNESIUM: Magnesium: 1.5 mg/dL — ABNORMAL LOW (ref 1.7–2.4)

## 2019-10-14 MED ORDER — K PHOS MONO-SOD PHOS DI & MONO 155-852-130 MG PO TABS
500.0000 mg | ORAL_TABLET | Freq: Three times a day (TID) | ORAL | Status: DC
  Administered 2019-10-14 – 2019-10-19 (×15): 500 mg via ORAL
  Filled 2019-10-14 (×18): qty 2

## 2019-10-14 MED ORDER — MAGNESIUM SULFATE 2 GM/50ML IV SOLN
2.0000 g | Freq: Once | INTRAVENOUS | Status: AC
  Administered 2019-10-14: 2 g via INTRAVENOUS
  Filled 2019-10-14: qty 50

## 2019-10-14 MED ORDER — POTASSIUM CHLORIDE CRYS ER 20 MEQ PO TBCR
40.0000 meq | EXTENDED_RELEASE_TABLET | Freq: Two times a day (BID) | ORAL | Status: AC
  Administered 2019-10-14 – 2019-10-16 (×4): 40 meq via ORAL
  Filled 2019-10-14 (×4): qty 2

## 2019-10-14 NOTE — Plan of Care (Signed)
  Problem: Education: Goal: Knowledge of General Education information will improve Description: Including pain rating scale, medication(s)/side effects and non-pharmacologic comfort measures Outcome: Completed/Met   Problem: Activity: Goal: Risk for activity intolerance will decrease Outcome: Completed/Met   Problem: Coping: Goal: Level of anxiety will decrease Outcome: Completed/Met   Problem: Pain Managment: Goal: General experience of comfort will improve Outcome: Completed/Met

## 2019-10-14 NOTE — Progress Notes (Signed)
Assumed care of pt from CN. Condition stable. No changes in initial am assessment. Cont with plan of care.

## 2019-10-14 NOTE — Progress Notes (Signed)
SATURATION QUALIFICATIONS: (This note is used to comply with regulatory documentation for home oxygen)  Patient Saturations on Room Air at Rest = 95%  Patient Saturations on Room Air while Ambulating = 93%  Erica Escobar, Laurel Dimmer

## 2019-10-14 NOTE — Progress Notes (Signed)
PROGRESS NOTE    Erica Escobar  RWE:[id number] DOB: 1936-07-19 DOA: 10/12/2019 PCP: Deland Pretty, MD    Brief Narrative:  83 year old female with history of non-Hodgkin's lymphoma treated with chemo on remission, colon cancer status post right colectomy, stage III chronic kidney disease, hyperlipidemia, anemia and GERD presented to the emergency room for evaluation of dyspnea.  Patient had several weeks of progressive lower extremity and abdominal swelling.  She has been having shortness of breath.  Most of the symptoms worsening over the last 1 month.  She was seen by her PCP noted to be hypoxic with saturation 83% on room air and sent to ER. In the emergency room, hemodynamically stable.  Needed 2 L oxygen with saturation 100%.  LFTs and renal function test are normal with elevated alkaline phosphatase of 325.  Bilirubin is 1.6.  WBC 2.8, platelets 109K.  Assessment & Plan:   Principal Problem:   Acute respiratory failure with hypoxia (HCC) Active Problems:   Cancer of ascending colon s/p robotic colectomy 02/15/2015   CKD (chronic kidney disease), stage III   Hyperlipidemia   Pleural effusion on right   Cirrhosis of liver with ascites (HCC)   Pancytopenia (HCC)  Acute respiratory failure with hypoxia: Due to ascites and fluid overload due to hypoalbuminemia and primary liver disease.  She does have minimal pleural effusion.  Underwent paracentesis with improvement of oxygenation.  Currently oxygenating 93% on room air.  Will treat with diuretics.  Anasarca/new onset ascites with evidence of chronic liver disease on CT scan: Paracentesis revealed 4.2 L.  Transudate.  No growth.  Cytology pending.  LFTs are normal.  INR is normal.  Echocardiogram with normal ejection fraction. Patient may need further work-up including autoimmune diseases.  Called and discussed case with gastroenterologist, Dr. Benson Norway.  They will see schedule an appointment in 2 days in the office. Discussed with  gastroenterology.  Recommended discharging on Aldactone and Lasix with potassium supplement.  Outpatient follow-up.  Non-Hodgkin's lymphoma and history of colon cancer status post right colectomy: Followed by oncology Dr. Benay Spice.  Reportedly on remission.  Hypokalemia: Replaced.  Further replacement.   Hypomagnesemia: Replace and monitor levels.  Orthostatic hypotension: Recently started on Florinef.  Continue.  Pancytopenia: Probably related to previous chemotherapy or chronic liver disease.  Stable.  Ambulate with PT OT.  DVT prophylaxis: SCDs Code Status: DNR Family Communication: Patient's son, called and updated. Disposition Plan: Unknown.  Deconditioned.  Work with PT OT before discharge planning.  Lives at home by herself.   Consultants:   Gastroenterology.  Procedures:   Paracentesis, 10/13/2019.  Transudate.  Antimicrobials:   None   Subjective: Patient was seen and examined.  No overnight events.  Abdominal pain improved.  Oxygen with improved after paracentesis.  Difficulty ambulating.  Needed nursing to hold her to go to bathroom.  Oxygen remained stable on ambulation. Yet to work with PT OT.  Objective: Vitals:   10/13/19 1257 10/13/19 2017 10/14/19 0625 10/14/19 1336  BP: 122/69 127/76 124/71 117/75  Pulse: 79 84 82 85  Resp: 15 16 16 19   Temp: 98.4 F (36.9 C) 99.6 F (37.6 C) 99.2 F (37.3 C) 98.2 F (36.8 C)  TempSrc: Oral Oral Oral Oral  SpO2: 97% 98% 96% 98%  Weight:      Height:        Intake/Output Summary (Last 24 hours) at 10/14/2019 1604 Last data filed at 10/14/2019 1015 Gross per 24 hour  Intake 0 ml  Output 1010 ml  Net -1010 ml   Filed Weights   10/12/19 1822 10/12/19 2357 10/13/19 0503  Weight: 72.6 kg 75.4 kg 76.5 kg    Examination:  General exam: Appears calm and comfortable, chronically sick looking.  On room air.  Looks comfortable. Respiratory system: Clear to auscultation. Respiratory effort normal.  No added  sounds. Cardiovascular system: S1 & S2 heard, RRR. No JVD, murmurs, rubs, gallops or clicks.  1+ pedal edema mostly on the dorsum of the foot.  Gastrointestinal system: Abdomen is distended but not tender.  Less distended than previous.  No organomegaly or masses felt. Normal bowel sounds heard. Central nervous system: Alert and oriented. No focal neurological deficits. Extremities: Symmetric 5 x 5 power. Skin: No rashes, lesions or ulcers Psychiatry: Judgement and insight appear normal. Mood & affect appropriate.     Data Reviewed: I have personally reviewed following labs and imaging studies  CBC: Recent Labs  Lab 10/12/19 1850 10/13/19 0453 10/14/19 0527  WBC 2.8* 2.3* 2.6*  NEUTROABS 1.8  --  1.5*  HGB 11.2* 9.7* 10.4*  HCT 33.3* 27.9* 30.9*  MCV 98.8 98.2 99.7  PLT 109* 82* 92*   Basic Metabolic Panel: Recent Labs  Lab 10/12/19 1850 10/12/19 2050 10/13/19 0453 10/14/19 0527  NA 142  --  141 141  K 3.3*  --  3.7 3.1*  CL 109  --  110 107  CO2 25  --  24 25  GLUCOSE 95  --  87 96  BUN 16  --  15 14  CREATININE 1.08*  --  0.98 0.87  CALCIUM 8.2*  --  8.0* 7.7*  MG  --  1.7  --  1.5*  PHOS  --   --   --  2.3*   GFR: Estimated Creatinine Clearance: 49.6 mL/min (by C-G formula based on SCr of 0.87 mg/dL). Liver Function Tests: Recent Labs  Lab 10/12/19 1850 10/13/19 0453 10/14/19 0527  AST 48* 42* 41  ALT 29 25 24   ALKPHOS 325* 271* 271*  BILITOT 1.6* 1.3* 1.4*  PROT 4.9* 4.3* 4.3*  ALBUMIN 2.7* 2.3* 2.3*   Recent Labs  Lab 10/12/19 1850  LIPASE 54*   No results for input(s): AMMONIA in the last 168 hours. Coagulation Profile: Recent Labs  Lab 10/13/19 0453  INR 1.0   Cardiac Enzymes: No results for input(s): CKTOTAL, CKMB, CKMBINDEX, TROPONINI in the last 168 hours. BNP (last 3 results) No results for input(s): PROBNP in the last 8760 hours. HbA1C: No results for input(s): HGBA1C in the last 72 hours. CBG: No results for input(s): GLUCAP in  the last 168 hours. Lipid Profile: No results for input(s): CHOL, HDL, LDLCALC, TRIG, CHOLHDL, LDLDIRECT in the last 72 hours. Thyroid Function Tests: Recent Labs    10/13/19 0453  TSH 2.244   Anemia Panel: No results for input(s): VITAMINB12, FOLATE, FERRITIN, TIBC, IRON, RETICCTPCT in the last 72 hours. Sepsis Labs: No results for input(s): PROCALCITON, LATICACIDVEN in the last 168 hours.  Recent Results (from the past 240 hour(s))  SARS CORONAVIRUS 2 (TAT 6-24 HRS) Nasopharyngeal Nasopharyngeal Swab     Status: None   Collection Time: 10/12/19  9:56 PM   Specimen: Nasopharyngeal Swab  Result Value Ref Range Status   SARS Coronavirus 2 NEGATIVE NEGATIVE Final    Comment: (NOTE) SARS-CoV-2 target nucleic acids are NOT DETECTED. The SARS-CoV-2 RNA is generally detectable in upper and lower respiratory specimens during the acute phase of infection. Negative results do not preclude SARS-CoV-2 infection, do not  rule out co-infections with other pathogens, and should not be used as the sole basis for treatment or other patient management decisions. Negative results must be combined with clinical observations, patient history, and epidemiological information. The expected result is Negative. Fact Sheet for Patients: SugarRoll.be Fact Sheet for Healthcare Providers: https://www.woods-mathews.com/ This test is not yet approved or cleared by the [place] FDA and  has been authorized for detection and/or diagnosis of SARS-CoV-2 by FDA under an Emergency Use Authorization (EUA). This EUA will remain  in effect (meaning this test can be used) for the duration of the COVID-19 declaration under Section 56 4(b)(1) of the Act, 21 U.S.C. section 360bbb-3(b)(1), unless the authorization is terminated or revoked sooner. Performed at [facility], [place] [address] [place], [place] [address]   Gram stain     Status: None   Collection Time:  10/13/19 10:12 AM   Specimen: Fluid  Result Value Ref Range Status   Specimen Description FLUID  Final   Special Requests NONE  Final   Gram Stain   Final    WBC PRESENT, PREDOMINANTLY MONONUCLEAR NO ORGANISMS SEEN CYTOSPIN SMEAR Performed at [facility], [place] [address] [place], [place] [address]    Report Status 10/13/2019 FINAL  Final  Culture, body fluid-bottle     Status: None (Preliminary result)   Collection Time: 10/13/19 10:12 AM   Specimen: Fluid  Result Value Ref Range Status   Specimen Description FLUID  Final   Special Requests NONE  Final   Culture   Final    NO GROWTH < 24 HOURS Performed at [facility], [place] [address], [place], [place] [address]    Report Status PENDING  Incomplete         Radiology Studies: Dg Chest 2 View  Result Date: 10/12/2019 CLINICAL DATA:  Shortness of breath, exertional dyspnea for 1-2 weeks, abdominal distension lower extremity edema EXAM: CHEST - 2 VIEW COMPARISON:  Radiograph 09/11/2019, CT 12/23/2018 FINDINGS: Small to moderate right pleural effusion, similar to slightly increased in size from comparison images. Adjacent atelectasis. No convincing features of interstitial or alveolar edema. No consolidative process. Cardiomediastinal contours are similar to comparison exam. Degenerative changes are present in the imaged spine and shoulders. No acute osseous or soft tissue abnormality. IMPRESSION: Small to moderate right pleural effusion, similar to minimally increased in size from comparison images. No convincing features of interstitial or alveolar edema. Electronically Signed   By: Lovena Le M.D.   On: 10/12/2019 19:23   Ct Abdomen Pelvis W Contrast  Result Date: 10/12/2019 CLINICAL DATA:  Abdominal distension EXAM: CT ABDOMEN AND PELVIS WITH CONTRAST TECHNIQUE: Multidetector CT imaging of the abdomen and pelvis was performed using the standard protocol following bolus administration of intravenous contrast.  CONTRAST:  139m OMNIPAQUE IOHEXOL 300 MG/ML  SOLN COMPARISON:  12/23/2018, 08/29/2018 FINDINGS: Lower chest: Moderate right-sided pleural effusion is noted with underlying atelectatic changes. The left lung base is clear. Hepatobiliary: Considerable ascites is noted surrounding the liver. Changes consistent with hepatic cirrhosis are noted with recanalization of the umbilical vein. This is new from the prior exam. The fluid in the abdomen may contribute to the right-sided effusion. The gallbladder has been surgically removed. Pancreas: Unremarkable. No pancreatic ductal dilatation or surrounding inflammatory changes. Spleen: Spleen is mildly prominent consistent with the underlying cirrhosis. Adrenals/Urinary Tract: Adrenal glands are within normal limits. The left kidney shows a normal enhancement pattern. The right kidney also shows a normal enhancement pattern. Excretion  is noted bilaterally. No obstructive changes are seen. The bladder is decompressed. Stomach/Bowel: Diverticular change of the colon is noted without evidence of diverticulitis. No obstructive or inflammatory changes of the colon are seen. Postsurgical changes are noted consistent with right hemicolectomy with small bowel to colon surgery. The overall appearance is similar to that seen on prior exam. Small bowel is within normal limits. The stomach shows a sliding-type hiatal hernia. Vascular/Lymphatic: Aortic atherosclerosis. No enlarged abdominal or pelvic lymph nodes. Reproductive: Status post hysterectomy. No adnexal masses. Other: Considerable ascites is identified. Mild extension of the fluid into the inguinal canals is noted bilaterally. Mild changes of anasarca are noted. Musculoskeletal: Degenerative changes of the lumbar spine are noted. No acute abnormality is seen. IMPRESSION: Changes consistent with cirrhosis of the liver with associated ascites and portal hypertension with evidence of recanalization of the umbilical vein. These  changes have increased in the interval from the prior exam. Right-sided pleural effusion which may be related to the underlying ascites. Diverticulosis without diverticulitis. Electronically Signed   By: Inez Catalina M.D.   On: 10/12/2019 21:40   US Paracentesis  Result Date: 10/13/2019 INDICATION: Patient with prior history of non-Hodgkin's lymphoma, colon cancer with prior right colectomy, chronic kidney disease, cirrhosis by imaging, ascites; request received for diagnostic and therapeutic paracentesis. EXAM: ULTRASOUND GUIDED DIAGNOSTIC AND THERAPEUTIC PARACENTESIS MEDICATIONS: None COMPLICATIONS: None immediate. PROCEDURE: Informed written consent was obtained from the patient after a discussion of the risks, benefits and alternatives to treatment. A timeout was performed prior to the initiation of the procedure. Initial ultrasound scanning demonstrates a moderate to large amount of ascites within the right lower abdominal quadrant. The right lower abdomen was prepped and draped in the usual sterile fashion. 1% lidocaine was used for local anesthesia. Following this, a 19 gauge, 10-cm, Yueh catheter was introduced. An ultrasound image was saved for documentation purposes. The paracentesis was performed. The catheter was removed and a dressing was applied. The patient tolerated the procedure well without immediate post procedural complication. FINDINGS: A total of approximately 4.1 liters of slightly hazy, yellow fluid was removed. Samples were sent to the laboratory as requested by the clinical team. IMPRESSION: Successful ultrasound-guided diagnostic and therapeutic paracentesis yielding 4.1 liters of peritoneal fluid. Read by: Rowe Robert, PA-C Electronically Signed   By: Aletta Edouard M.D.   On: 10/13/2019 12:33        Scheduled Meds:  feeding supplement (ENSURE ENLIVE)  237 mL Oral Daily   fludrocortisone  0.2 mg Oral Daily   furosemide  20 mg Intravenous Q12H   gemfibrozil  600 mg  Oral q morning - 10a   mirtazapine  7.5 mg Oral QHS   multivitamin with minerals  1 tablet Oral Daily   pantoprazole  40 mg Oral Daily   phosphorus  500 mg Oral TID   potassium chloride  20 mEq Oral BID   sodium chloride flush  3 mL Intravenous Q12H   spironolactone  100 mg Oral Daily   vitamin B-12  1,000 mcg Oral Daily   Continuous Infusions:  magnesium sulfate bolus IVPB       LOS: 2 days    Time spent: 25 minutes    Barb Merino, MD [facility] Pager [phone]

## 2019-10-15 LAB — CBC WITH DIFFERENTIAL/PLATELET
Abs Immature Granulocytes: 0.01 10*3/uL (ref 0.00–0.07)
Basophils Absolute: 0 10*3/uL (ref 0.0–0.1)
Basophils Relative: 0 %
Eosinophils Absolute: 0.1 10*3/uL (ref 0.0–0.5)
Eosinophils Relative: 4 %
HCT: 31.4 % — ABNORMAL LOW (ref 36.0–46.0)
Hemoglobin: 10.8 g/dL — ABNORMAL LOW (ref 12.0–15.0)
Immature Granulocytes: 0 %
Lymphocytes Relative: 22 %
Lymphs Abs: 0.6 10*3/uL — ABNORMAL LOW (ref 0.7–4.0)
MCH: 34 pg (ref 26.0–34.0)
MCHC: 34.4 g/dL (ref 30.0–36.0)
MCV: 98.7 fL (ref 80.0–100.0)
Monocytes Absolute: 0.5 10*3/uL (ref 0.1–1.0)
Monocytes Relative: 21 %
Neutro Abs: 1.4 10*3/uL — ABNORMAL LOW (ref 1.7–7.7)
Neutrophils Relative %: 53 %
Platelets: 90 10*3/uL — ABNORMAL LOW (ref 150–400)
RBC: 3.18 MIL/uL — ABNORMAL LOW (ref 3.87–5.11)
RDW: 15 % (ref 11.5–15.5)
WBC: 2.6 10*3/uL — ABNORMAL LOW (ref 4.0–10.5)
nRBC: 0 % (ref 0.0–0.2)

## 2019-10-15 LAB — BASIC METABOLIC PANEL
Anion gap: 11 (ref 5–15)
BUN: 17 mg/dL (ref 8–23)
CO2: 28 mmol/L (ref 22–32)
Calcium: 7.8 mg/dL — ABNORMAL LOW (ref 8.9–10.3)
Chloride: 103 mmol/L (ref 98–111)
Creatinine, Ser: 1 mg/dL (ref 0.44–1.00)
GFR calc Af Amer: >60 mL/min (ref >60)
GFR calc non Af Amer: 53 mL/min — ABNORMAL LOW (ref >60)
Glucose, Bld: 90 mg/dL (ref 70–99)
Potassium: 3.4 mmol/L — ABNORMAL LOW (ref 3.5–5.1)
Sodium: 142 mmol/L (ref 135–145)

## 2019-10-15 LAB — MAGNESIUM: Magnesium: 1.6 mg/dL — ABNORMAL LOW (ref 1.7–2.4)

## 2019-10-15 MED ORDER — MAGNESIUM SULFATE 2 GM/50ML IV SOLN
2.0000 g | Freq: Once | INTRAVENOUS | Status: AC
  Administered 2019-10-15: 2 g via INTRAVENOUS
  Filled 2019-10-15: qty 50

## 2019-10-15 MED ORDER — POTASSIUM CHLORIDE CRYS ER 20 MEQ PO TBCR
40.0000 meq | EXTENDED_RELEASE_TABLET | Freq: Once | ORAL | Status: AC
  Administered 2019-10-15: 40 meq via ORAL
  Filled 2019-10-15: qty 2

## 2019-10-15 NOTE — Evaluation (Signed)
Physical Therapy Evaluation Patient Details Name: Erica Escobar MRN: [id number] DOB: October 18, 1936 Today's Date: 10/15/2019   History of Present Illness  Pt admitted through ED 10/11/19 with acute respiratory failure with hypoxia 2* ascites and fluid overload; Paracentesis performed 10/30; Pt with hx of COPD, Colon CA, non-hodgkins Lymphoma, CKD, and peripheral neuropathy.  Clinical Impression  Pt admitted as above and presenting with functional mobility limitations 2* generalized weakness, balance deficits and limited endurance.  Pt hopes to progress to dc home but is currently at high risk for future falls (pt admits 13 previous falls) and could benefit from follow up rehab at SNF level to maximize IND and safety unless 24/7 home assist can be arranged.    Follow Up Recommendations Home health PT;SNF;Supervision/Assistance - 24 hour(dependent on assist level available at home)    Equipment Recommendations  None recommended by PT    Recommendations for Other Services       Precautions / Restrictions Precautions Precautions: Fall Precaution Comments: patient has had multiple falls Restrictions Weight Bearing Restrictions: No      Mobility  Bed Mobility Overal bed mobility: Modified Independent             General bed mobility comments: Increased time with use of bedrails but no physical assist  Transfers Overall transfer level: Needs assistance Equipment used: Rolling walker (2 wheeled) Transfers: Sit to/from Stand Sit to Stand: Min assist         General transfer comment: steady assist only to stand but min assist to sit following knees buckling. Cues for use of UEs to self assist  Ambulation/Gait Ambulation/Gait assistance: Min assist;Min guard Gait Distance (Feet): 47 Feet Assistive device: Rolling walker (2 wheeled) Gait Pattern/deviations: Step-through pattern;Decreased step length - right;Decreased step length - left;Shuffle;Trunk flexed Gait velocity:  decr   General Gait Details: cues for posture and position from RW; increased time with slow pace; distance ltd 2* fatigue and with pt's knees buckling requiring min assist to maintain standing  Stairs            Wheelchair Mobility    Modified Rankin (Stroke Patients Only)       Balance Overall balance assessment: Needs assistance Sitting-balance support: No upper extremity supported;Bilateral upper extremity supported Sitting balance-Leahy Scale: Good     Standing balance support: Bilateral upper extremity supported Standing balance-Leahy Scale: Poor                               Pertinent Vitals/Pain Pain Assessment: 0-10 Pain Score: 5  Pain Location: abdomen Pain Descriptors / Indicators: Aching;Sore Pain Intervention(s): Limited activity within patient's tolerance;Monitored during session;RN gave pain meds during session    [place] expects to be discharged to:: Private residence Living Arrangements: Alone Available Help at Discharge: Available PRN/intermittently;Family Type of Home: Mobile home Home Access: Ramped entrance     Home Layout: One level Home Equipment: [place] - 4 wheels;Bedside commode;Walker - 2 wheels Additional Comments: Son checks in periodically but can be days between, Has cleaning lady    Prior Function Level of Independence: Independent with assistive device(s)         Comments: rollator     Hand Dominance   Dominant Hand: Right    Extremity/Trunk Assessment   Upper Extremity Assessment Upper Extremity Assessment: Defer to OT evaluation    Lower Extremity Assessment Lower Extremity Assessment: Generalized weakness    Cervical / Trunk Assessment Cervical / Trunk Assessment:  Kyphotic  Communication   Communication: HOH  Cognition Arousal/Alertness: Awake/alert Behavior During Therapy: WFL for tasks assessed/performed Overall Cognitive Status: Within Functional Limits for tasks  assessed                                        General Comments      Exercises     Assessment/Plan    PT Assessment Patient needs continued PT services  PT Problem List Decreased strength;Decreased activity tolerance;Decreased balance;Decreased mobility;Decreased knowledge of use of DME       PT Treatment Interventions DME instruction;Gait training;Functional mobility training;Therapeutic activities;Therapeutic exercise;Patient/family education    PT Goals (Current goals can be found in the Care Plan section)  Acute Rehab PT Goals Patient Stated Goal: Regain IND PT Goal Formulation: With patient Time For Goal Achievement: 10/29/19 Potential to Achieve Goals: Fair    Frequency Min 3X/week   Barriers to discharge Decreased caregiver support Pt lives alone    Co-evaluation PT/OT/SLP Co-Evaluation/Treatment: Yes Reason for Co-Treatment: For patient/therapist safety PT goals addressed during session: Mobility/safety with mobility OT goals addressed during session: ADL's and self-care       AM-PAC PT "6 Clicks" Mobility  Outcome Measure Help needed turning from your back to your side while in a flat bed without using bedrails?: None Help needed moving from lying on your back to sitting on the side of a flat bed without using bedrails?: A Little Help needed moving to and from a bed to a chair (including a wheelchair)?: A Little Help needed standing up from a chair using your arms (e.g., wheelchair or bedside chair)?: A Little Help needed to walk in hospital room?: A Little Help needed climbing 3-5 steps with a railing? : A Lot 6 Click Score: 18    End of Session Equipment Utilized During Treatment: Gait belt Activity Tolerance: Patient limited by fatigue Patient left: in chair;with call bell/phone within reach Nurse Communication: Mobility status PT Visit Diagnosis: Difficulty in walking, not elsewhere classified (R26.2);Muscle weakness (generalized)  (M62.81);History of falling (Z91.81)    Time: 1200-1230 PT Time Calculation (min) (ACUTE ONLY): 30 min   Charges:   PT Evaluation $PT Eval Low Complexity: 1 Low          [facility] Pager [phone] Office [phone]   Meaghen Vecchiarelli 10/15/2019, 1:41 PM

## 2019-10-15 NOTE — Progress Notes (Signed)
PROGRESS NOTE    Erica Escobar  CXK:[id number] DOB: [birth year] DOA: 10/12/2019 PCP: Deland Pretty, MD  Brief Narrative:83 year old female with history of non-Hodgkin's lymphoma treated with chemo on remission, colon cancer status post right colectomy, stage III chronic kidney disease, hyperlipidemia, anemia and GERD presented to the emergency room for evaluation of dyspnea.  Patient had several weeks of progressive lower extremity and abdominal swelling.  She has been having shortness of breath.  Most of the symptoms worsening over the last 1 month.  She was seen by her PCP noted to be hypoxic with saturation 83% on room air and sent to ER. In the emergency room, hemodynamically stable.  Needed 2 L oxygen with saturation 100%.  LFTs and renal function test are normal with elevated alkaline phosphatase of 325.  Bilirubin is 1.6.  WBC 2.8, platelets 109K.  Assessment & Plan:   Principal Problem:   Acute respiratory failure with hypoxia (HCC) Active Problems:   Cancer of ascending colon s/p robotic colectomy 02/15/2015   CKD (chronic kidney disease), stage III   Hyperlipidemia   Pleural effusion on right   Cirrhosis of liver with ascites (HCC)   Pancytopenia (HCC)    Acute respiratory failure with hypoxia: Due to ascites and fluid overload due to hypoalbuminemia and cirrhosis of the liver.  Due to hypoalbuminemia and primary liver disease.  She does have minimal pleural effusion.    Sats 97% on 1 L.  Continue Aldactone and Lasix.  Anasarca/new onset ascites with evidence of chronic liver disease on CT scan: Paracentesis revealed 4.2 L.  Transudate.  No growth.  Cytology pending.  LFTs are normal.  INR is normal.  Echocardiogram with normal ejection fraction.  Dr. Sloan Leiter discussed with Dr. Benson Norway.   They will see schedule an appointment in 2 days in the office.   Recommended discharging on Aldactone and Lasix with potassium supplement.  Outpatient follow-up.  Non-Hodgkin's lymphoma and  history of colon cancer status post right colectomy: Followed by oncology Dr. Benay Spice.  Reportedly on remission.  Hypokalemia: Replaced.    Potassium 3.4.    Hypomagnesemia:  Replete magnesium it is 1.6.  Orthostatic hypotension: Recently started on Florinef.  Continue.  Pancytopenia: Probably related to previous chemotherapy or chronic liver disease.  Stable.  Generalized deconditioning and severe hard of hearing-patient lives at home alone.  She is very unsteady on her feet.  Staff try to walk her today and she was very unsteady has history of multiple falls.  Await PT eval suspect she might need SNF.  Patient is not a safe discharge home due to unsteady gait and multiple falls and she lives alone.    Estimated body mass index is 28.12 kg/m as calculated from the following:   Height as of this encounter: 5' 3"  (1.6 m).   Weight as of this encounter: 72 kg.  DVT prophylaxis: SCDs Code Status: DNR Family Communication: Discussed with patient's son Carloyn Manner. Disposition Plan: Unknown.  Deconditioned.  Work with PT OT before discharge planning.  Lives at home by herself.  Multiple falls   Consultants:   Gastroenterology.  Procedures:   Paracentesis, 10/13/2019.  Transudate.  Antimicrobials:   None  Subjective: Feels weak all over breathing better after paracentesis  Objective:pleasant extremely hard of hearing in nad  Vitals:   10/14/19 1336 10/14/19 2200 10/15/19 0500 10/15/19 0506  BP: 117/75 114/69  115/76  Pulse: 85 80  81  Resp: 19 16  18   Temp: 98.2 F (36.8 C) 99.6 F (37.6  C)  99 F (37.2 C)  TempSrc: Oral Oral  Oral  SpO2: 98% 95%  97%  Weight:   72 kg   Height:        Intake/Output Summary (Last 24 hours) at 10/15/2019 1112 Last data filed at 10/15/2019 0900 Gross per 24 hour  Intake 240 ml  Output 1200 ml  Net -960 ml   Filed Weights   10/12/19 2357 10/13/19 0503 10/15/19 0500  Weight: 75.4 kg 76.5 kg 72 kg    Examination:  General  exam: Appears calm and comfortable  Respiratory system: Diminished breath sounds at the bases to auscultation. Respiratory effort normal. Cardiovascular system: S1 & S2 heard, RRR. No JVD, murmurs, rubs, gallops or clicks. No pedal edema. Gastrointestinal system: Abdomen is distended, soft and nontender. No organomegaly or masses felt. Normal bowel sounds heard. Central nervous system: Alert and oriented. No focal neurological deficits. Extremities: Symmetric 5 x 5 power. Skin: No rashes, lesions or ulcers Psychiatry: Judgement and insight appear normal. Mood & affect appropriate.     Data Reviewed: I have personally reviewed following labs and imaging studies  CBC: Recent Labs  Lab 10/12/19 1850 10/13/19 0453 10/14/19 0527 10/15/19 0520  WBC 2.8* 2.3* 2.6* 2.6*  NEUTROABS 1.8  --  1.5* 1.4*  HGB 11.2* 9.7* 10.4* 10.8*  HCT 33.3* 27.9* 30.9* 31.4*  MCV 98.8 98.2 99.7 98.7  PLT 109* 82* 92* 90*   Basic Metabolic Panel: Recent Labs  Lab 10/12/19 1850 10/12/19 2050 10/13/19 0453 10/14/19 0527 10/15/19 0520  NA 142  --  141 141 142  K 3.3*  --  3.7 3.1* 3.4*  CL 109  --  110 107 103  CO2 25  --  24 25 28   GLUCOSE 95  --  87 96 90  BUN 16  --  15 14 17   CREATININE 1.08*  --  0.98 0.87 1.00  CALCIUM 8.2*  --  8.0* 7.7* 7.8*  MG  --  1.7  --  1.5* 1.6*  PHOS  --   --   --  2.3*  --    GFR: Estimated Creatinine Clearance: 41.9 mL/min (by C-G formula based on SCr of 1 mg/dL). Liver Function Tests: Recent Labs  Lab 10/12/19 1850 10/13/19 0453 10/14/19 0527  AST 48* 42* 41  ALT 29 25 24   ALKPHOS 325* 271* 271*  BILITOT 1.6* 1.3* 1.4*  PROT 4.9* 4.3* 4.3*  ALBUMIN 2.7* 2.3* 2.3*   Recent Labs  Lab 10/12/19 1850  LIPASE 54*   No results for input(s): AMMONIA in the last 168 hours. Coagulation Profile: Recent Labs  Lab 10/13/19 0453  INR 1.0   Cardiac Enzymes: No results for input(s): CKTOTAL, CKMB, CKMBINDEX, TROPONINI in the last 168 hours. BNP (last 3  results) No results for input(s): PROBNP in the last 8760 hours. HbA1C: No results for input(s): HGBA1C in the last 72 hours. CBG: No results for input(s): GLUCAP in the last 168 hours. Lipid Profile: No results for input(s): CHOL, HDL, LDLCALC, TRIG, CHOLHDL, LDLDIRECT in the last 72 hours. Thyroid Function Tests: Recent Labs    10/13/19 0453  TSH 2.244   Anemia Panel: No results for input(s): VITAMINB12, FOLATE, FERRITIN, TIBC, IRON, RETICCTPCT in the last 72 hours. Sepsis Labs: No results for input(s): PROCALCITON, LATICACIDVEN in the last 168 hours.  Recent Results (from the past 240 hour(s))  SARS CORONAVIRUS 2 (TAT 6-24 HRS) Nasopharyngeal Nasopharyngeal Swab     Status: None   Collection Time: 10/12/19  9:56 PM   Specimen: Nasopharyngeal Swab  Result Value Ref Range Status   SARS Coronavirus 2 NEGATIVE NEGATIVE Final    Comment: (NOTE) SARS-CoV-2 target nucleic acids are NOT DETECTED. The SARS-CoV-2 RNA is generally detectable in upper and lower respiratory specimens during the acute phase of infection. Negative results do not preclude SARS-CoV-2 infection, do not rule out co-infections with other pathogens, and should not be used as the sole basis for treatment or other patient management decisions. Negative results must be combined with clinical observations, patient history, and epidemiological information. The expected result is Negative. Fact Sheet for Patients: SugarRoll.be Fact Sheet for Healthcare Providers: https://www.[employer].com/ This test is not yet approved or cleared by the [place] FDA and  has been authorized for detection and/or diagnosis of SARS-CoV-2 by FDA under an Emergency Use Authorization (EUA). This EUA will remain  in effect (meaning this test can be used) for the duration of the COVID-19 declaration under Section 56 4(b)(1) of the Act, 21 U.S.C. section 360bbb-3(b)(1), unless the  authorization is terminated or revoked sooner. Performed at [facility], [place] [address], [place], [place] [address]   Gram stain     Status: None   Collection Time: 10/13/19 10:12 AM   Specimen: Fluid  Result Value Ref Range Status   Specimen Description FLUID  Final   Special Requests NONE  Final   Gram Stain   Final    WBC PRESENT, PREDOMINANTLY MONONUCLEAR NO ORGANISMS SEEN CYTOSPIN SMEAR Performed at [facility], [place] [address] [place], [place] [address]    Report Status 10/13/2019 FINAL  Final  Culture, body fluid-bottle     Status: None (Preliminary result)   Collection Time: 10/13/19 10:12 AM   Specimen: Fluid  Result Value Ref Range Status   Specimen Description FLUID  Final   Special Requests NONE  Final   Culture   Final    NO GROWTH 2 DAYS Performed at [place] [address] [place], [place] [address]    Report Status PENDING  Incomplete         Radiology Studies: No results found.      Scheduled Meds: . feeding supplement (ENSURE ENLIVE)  237 mL Oral Daily  . fludrocortisone  0.2 mg Oral Daily  . furosemide  20 mg Intravenous Q12H  . gemfibrozil  600 mg Oral q morning - 10a  . mirtazapine  7.5 mg Oral QHS  . multivitamin with minerals  1 tablet Oral Daily  . pantoprazole  40 mg Oral Daily  . phosphorus  500 mg Oral TID  . potassium chloride  40 mEq Oral BID  . sodium chloride flush  3 mL Intravenous Q12H  . spironolactone  100 mg Oral Daily  . vitamin B-12  1,000 mcg Oral Daily   Continuous Infusions:   LOS: 3 days     Georgette Shell, MD [facility] If 7PM-7AM, please contact night-coverage [email or web address] Password [email or web address] 10/15/2019, 11:12 AM

## 2019-10-15 NOTE — Evaluation (Signed)
Occupational Therapy Evaluation Patient Details Name: Erica Escobar MRN: [id number] DOB: 08/29/1936 Today's Date: 10/15/2019    History of Present Illness Pt admitted through ED 10/11/19 with acute respiratory failure with hypoxia 2* ascites and fluid overload; Paracentesis performed 10/30; Pt with hx of COPD, Colon CA, non-hodgkins Lymphoma, CKD, and peripheral neuropathy.   Clinical Impression   Pt. Was seen for skilled OT evaluation. Pt. Has decreased I and safety with ADLs and  ADL transfers. Pt. Has decreased safety and reports many falls at home. Pt. Lives alone and either needs 24/7 care or SNF for rehab.     Follow Up Recommendations  SNF;Supervision/Assistance - 24 hour    Equipment Recommendations  None recommended by OT    Recommendations for Other Services       Precautions / Restrictions Precautions Precautions: Fall Precaution Comments: patient has had multiple falls Restrictions Weight Bearing Restrictions: No      Mobility Bed Mobility Overal bed mobility: Modified Independent             General bed mobility comments: Increased time with use of bedrails but no physical assist  Transfers Overall transfer level: Needs assistance Equipment used: Rolling walker (2 wheeled) Transfers: Sit to/from Stand Sit to Stand: Min assist         General transfer comment: steady assist only to stand but min assist to sit following knees buckling. Cues for use of UEs to self assist    Balance Overall balance assessment: Needs assistance Sitting-balance support: No upper extremity supported;Bilateral upper extremity supported Sitting balance-Leahy Scale: Good     Standing balance support: Bilateral upper extremity supported Standing balance-Leahy Scale: Poor                             ADL either performed or assessed with clinical judgement   ADL Overall ADL's : Needs assistance/impaired Eating/Feeding: Independent   Grooming: Wash/dry  hands;Wash/dry face;Supervision/safety;Set up;Sitting   Upper Body Bathing: Supervision/ safety;Sitting   Lower Body Bathing: Moderate assistance;Sit to/from stand   Upper Body Dressing : Set up;Supervision/safety;Sitting   Lower Body Dressing: Moderate assistance;Sit to/from stand   Toilet Transfer: Minimal assistance;BSC   Toileting- Clothing Manipulation and Hygiene: Minimal assistance;Sit to/from stand       Functional mobility during ADLs: Minimal assistance(Pt. knee started to buckle and she sat down in chair) General ADL Comments: Patient has decreased I and safety with ADLs.      Vision Baseline Vision/History: Wears glasses       Perception     Praxis      Pertinent Vitals/Pain Pain Assessment: 0-10 Pain Score: 5  Pain Location: abdomen Pain Descriptors / Indicators: Aching;Sore Pain Intervention(s): Limited activity within patient's tolerance;Monitored during session;RN gave pain meds during session     Hand Dominance Right   Extremity/Trunk Assessment Upper Extremity Assessment Upper Extremity Assessment: Defer to OT evaluation   Lower Extremity Assessment Lower Extremity Assessment: Generalized weakness   Cervical / Trunk Assessment Cervical / Trunk Assessment: Kyphotic   Communication Communication Communication: HOH   Cognition Arousal/Alertness: Awake/alert Behavior During Therapy: WFL for tasks assessed/performed Overall Cognitive Status: Within Functional Limits for tasks assessed                                     General Comments       Exercises     Shoulder Instructions  Home Living Family/patient expects to be discharged to:: Private residence Living Arrangements: Alone Available Help at Discharge: Available PRN/intermittently;Family Type of Home: Mobile home Home Access: Ramped entrance     Home Layout: One level     Bathroom Shower/Tub: Teacher, early years/pre: Handicapped height      Home Equipment: Environmental consultant - 4 wheels;Bedside commode;Walker - 2 wheels   Additional Comments: Son checks in periodically but can be days between, Has cleaning lady      Prior Functioning/Environment Level of Independence: Independent with assistive device(s)        Comments: rollator        OT Problem List: Decreased activity tolerance;Impaired balance (sitting and/or standing);Decreased knowledge of use of DME or AE;Decreased safety awareness      OT Treatment/Interventions: Self-care/ADL training;DME and/or AE instruction;Therapeutic activities;Patient/family education    OT Goals(Current goals can be found in the care plan section) Acute Rehab OT Goals Patient Stated Goal: Regain IND OT Goal Formulation: With patient/family Time For Goal Achievement: 10/29/19 Potential to Achieve Goals: Good  OT Frequency: Min 2X/week   Barriers to D/C: Decreased caregiver support          Co-evaluation   Reason for Co-Treatment: For patient/therapist safety PT goals addressed during session: Mobility/safety with mobility OT goals addressed during session: ADL's and self-care      AM-PAC OT "6 Clicks" Daily Activity     Outcome Measure Help from another person eating meals?: None Help from another person taking care of personal grooming?: A Little Help from another person toileting, which includes using toliet, bedpan, or urinal?: A Little Help from another person bathing (including washing, rinsing, drying)?: A Lot Help from another person to put on and taking off regular upper body clothing?: A Little Help from another person to put on and taking off regular lower body clothing?: A Lot 6 Click Score: 17   End of Session Equipment Utilized During Treatment: Gait belt;Rolling walker Nurse Communication: (ok therapy)  Activity Tolerance: Patient tolerated treatment well Patient left: in chair;with call bell/phone within reach  OT Visit Diagnosis: Unsteadiness on feet  (R26.81);Repeated falls (R29.6);History of falling (Z91.81)                Time: 5027-7412 OT Time Calculation (min): 32 min Charges:  OT General Charges $OT Visit: 1 Visit OT Evaluation $OT Eval Low Complexity: 1 Low OT Treatments $Self Care/Home Management : 8-78 mins  6 clicks  Noa Galvao 10/15/2019, 2:11 PM

## 2019-10-15 NOTE — Plan of Care (Signed)
Pt. Was seen for skilled OT evaluation. Pt. Has decreased I and safety with ADLs and  ADL transfers. Pt. Has decreased safety and reports many falls at home. Pt. Lives alone and either needs 24/7 care or SNF for rehab.

## 2019-10-16 DIAGNOSIS — E785 Hyperlipidemia, unspecified: Secondary | ICD-10-CM

## 2019-10-16 DIAGNOSIS — J90 Pleural effusion, not elsewhere classified: Secondary | ICD-10-CM

## 2019-10-16 DIAGNOSIS — D61818 Other pancytopenia: Secondary | ICD-10-CM

## 2019-10-16 DIAGNOSIS — K7460 Unspecified cirrhosis of liver: Secondary | ICD-10-CM

## 2019-10-16 DIAGNOSIS — C182 Malignant neoplasm of ascending colon: Secondary | ICD-10-CM

## 2019-10-16 DIAGNOSIS — R188 Other ascites: Secondary | ICD-10-CM

## 2019-10-16 DIAGNOSIS — N1831 Chronic kidney disease, stage 3a: Secondary | ICD-10-CM

## 2019-10-16 LAB — HEPATITIS B CORE ANTIBODY, IGM: Hep B C IgM: NONREACTIVE

## 2019-10-16 LAB — CYTOLOGY - NON PAP

## 2019-10-16 LAB — HEPATITIS C ANTIBODY: HCV Ab: NONREACTIVE

## 2019-10-16 MED ORDER — SUCRALFATE 1 G PO TABS
1.0000 g | ORAL_TABLET | Freq: Three times a day (TID) | ORAL | Status: DC
  Administered 2019-10-16 – 2019-10-19 (×13): 1 g via ORAL
  Filled 2019-10-16 (×13): qty 1

## 2019-10-16 NOTE — Progress Notes (Addendum)
PROGRESS NOTE    Erica Escobar  GYI:[id number]  DOB: 14-Nov-1936  DOA: 10/12/2019 PCP: Deland Pretty, MD  Brief Narrative:  83 year old female with history of non-Hodgkin's lymphomatreated with chemo on remission,colon cancer status post right colectomy, stage III chronic kidney disease, hyperlipidemia, anemia and GERD presented with c/o dyspnea x1 month.Patient hadseveral weeks of progressive lower extremity and abdominal swelling. She was seen by her PCP noted to be hypoxic with saturation 83% on room air and sent to ER. ED course: Hemodynamically stable. Needed 2 L oxygen with saturation 100%. LFTs and renal function test normal with elevated ALP of 325, Bilirubin 1.6, albumin 2.3 . WBC 2.8, platelets 109K. Hospital course:Underwent paracentesis with removal of 4.2L transudative fluid and improvement in oxygenation.She has minimal pleural effusion. Currently oxygenating 93% on room air. Now being treated with diuretics.Echocardiogram with normal ejection fraction. Patient planned to be discharged on oral Lasix/Aldactone and to f/u GI clinic for further work-up including autoimmune diseases.Patient is not a safe discharge home due to unsteady gait and multiple falls and she lives alone.PT/OT recommending SNF vs 24 hrs supervision/HH PT.   Subjective:  Patient resting comfortably.  Saturating well on room air.  Denies any acute complaints.  Objective: Vitals:   10/15/19 0506 10/15/19 1329 10/15/19 2051 10/16/19 0522  BP: 115/76 116/64 123/66 128/74  Pulse: 81 85 77 79  Resp: 18 18 18 18   Temp: 99 F (37.2 C) 98.6 F (37 C) 98.9 F (37.2 C) 97.9 F (36.6 C)  TempSrc: Oral Oral Oral Oral  SpO2: 97% 99% 96% 95%  Weight:    68.3 kg  Height:        Intake/Output Summary (Last 24 hours) at 10/16/2019 0831 Last data filed at 10/16/2019 0525 Gross per 24 hour  Intake 525.5 ml  Output 1350 ml  Net -824.5 ml   Filed Weights   10/13/19 0503 10/15/19 0500 10/16/19 0522  Weight:  76.5 kg 72 kg 68.3 kg    Physical Examination:  General exam: Appears calm and comfortable  Respiratory system: Clear to auscultation. Respiratory effort normal. Cardiovascular system: S1 & S2 heard, RRR. No JVD, murmurs, rubs, gallops or clicks. No pedal edema. Gastrointestinal system: Abdomen is nondistended, soft, mildly tender in epigastrium. No organomegaly or masses felt. Normal bowel sounds heard. Central nervous system: Alert and oriented. No focal neurological deficits. Extremities: Symmetric 5 x 5 power.  Trace leg edema Skin: No rashes, lesions or ulcers Psychiatry: Judgement and insight appear normal. Mood & affect appropriate.     Data Reviewed: I have personally reviewed following labs and imaging studies  CBC: Recent Labs  Lab 10/12/19 1850 10/13/19 0453 10/14/19 0527 10/15/19 0520  WBC 2.8* 2.3* 2.6* 2.6*  NEUTROABS 1.8  --  1.5* 1.4*  HGB 11.2* 9.7* 10.4* 10.8*  HCT 33.3* 27.9* 30.9* 31.4*  MCV 98.8 98.2 99.7 98.7  PLT 109* 82* 92* 90*   Basic Metabolic Panel: Recent Labs  Lab 10/12/19 1850 10/12/19 2050 10/13/19 0453 10/14/19 0527 10/15/19 0520  NA 142  --  141 141 142  K 3.3*  --  3.7 3.1* 3.4*  CL 109  --  110 107 103  CO2 25  --  24 25 28   GLUCOSE 95  --  87 96 90  BUN 16  --  15 14 17   CREATININE 1.08*  --  0.98 0.87 1.00  CALCIUM 8.2*  --  8.0* 7.7* 7.8*  MG  --  1.7  --  1.5* 1.6*  PHOS  --   --   --  2.3*  --    GFR: Estimated Creatinine Clearance: 41 mL/min (by C-G formula based on SCr of 1 mg/dL). Liver Function Tests: Recent Labs  Lab 10/12/19 1850 10/13/19 0453 10/14/19 0527  AST 48* 42* 41  ALT 29 25 24   ALKPHOS 325* 271* 271*  BILITOT 1.6* 1.3* 1.4*  PROT 4.9* 4.3* 4.3*  ALBUMIN 2.7* 2.3* 2.3*   Recent Labs  Lab 10/12/19 1850  LIPASE 54*   No results for input(s): AMMONIA in the last 168 hours. Coagulation Profile: Recent Labs  Lab 10/13/19 0453  INR 1.0   Cardiac Enzymes: No results for input(s): CKTOTAL,  CKMB, CKMBINDEX, TROPONINI in the last 168 hours. BNP (last 3 results) No results for input(s): PROBNP in the last 8760 hours. HbA1C: No results for input(s): HGBA1C in the last 72 hours. CBG: No results for input(s): GLUCAP in the last 168 hours. Lipid Profile: No results for input(s): CHOL, HDL, LDLCALC, TRIG, CHOLHDL, LDLDIRECT in the last 72 hours. Thyroid Function Tests: No results for input(s): TSH, T4TOTAL, FREET4, T3FREE, THYROIDAB in the last 72 hours. Anemia Panel: No results for input(s): VITAMINB12, FOLATE, FERRITIN, TIBC, IRON, RETICCTPCT in the last 72 hours. Sepsis Labs: No results for input(s): PROCALCITON, LATICACIDVEN in the last 168 hours.  Recent Results (from the past 240 hour(s))  SARS CORONAVIRUS 2 (TAT 6-24 HRS) Nasopharyngeal Nasopharyngeal Swab     Status: None   Collection Time: 10/12/19  9:56 PM   Specimen: Nasopharyngeal Swab  Result Value Ref Range Status   SARS Coronavirus 2 NEGATIVE NEGATIVE Final    Comment: (NOTE) SARS-CoV-2 target nucleic acids are NOT DETECTED. The SARS-CoV-2 RNA is generally detectable in upper and lower respiratory specimens during the acute phase of infection. Negative results do not preclude SARS-CoV-2 infection, do not rule out co-infections with other pathogens, and should not be used as the sole basis for treatment or other patient management decisions. Negative results must be combined with clinical observations, patient history, and epidemiological information. The expected result is Negative. Fact Sheet for Patients: SugarRoll.be Fact Sheet for Healthcare Providers: https://www.woods-mathews.com/ This test is not yet approved or cleared by the [place] FDA and  has been authorized for detection and/or diagnosis of SARS-CoV-2 by FDA under an Emergency Use Authorization (EUA). This EUA will remain  in effect (meaning this test can be used) for the duration of the COVID-19  declaration under Section 56 4(b)(1) of the Act, 21 U.S.C. section 360bbb-3(b)(1), unless the authorization is terminated or revoked sooner. Performed at [facility], [place] [address], [place], [place] [address]   Gram stain     Status: None   Collection Time: 10/13/19 10:12 AM   Specimen: Fluid  Result Value Ref Range Status   Specimen Description FLUID  Final   Special Requests NONE  Final   Gram Stain   Final    WBC PRESENT, PREDOMINANTLY MONONUCLEAR NO ORGANISMS SEEN CYTOSPIN SMEAR Performed at [facility], [place] [address], [place], [place] [address]    Report Status 10/13/2019 FINAL  Final  Culture, body fluid-bottle     Status: None (Preliminary result)   Collection Time: 10/13/19 10:12 AM   Specimen: Fluid  Result Value Ref Range Status   Specimen Description FLUID  Final   Special Requests NONE  Final   Culture   Final    NO GROWTH 3 DAYS Performed at [place] [address], [place], [place] [address]    Report Status PENDING  Incomplete      Radiology Studies: No results found.      Scheduled Meds: . feeding supplement (ENSURE ENLIVE)  237 mL Oral Daily  . fludrocortisone  0.2 mg Oral Daily  . furosemide  20 mg Intravenous Q12H  . gemfibrozil  600 mg Oral q morning - 10a  . mirtazapine  7.5 mg Oral QHS  . multivitamin with minerals  1 tablet Oral Daily  . pantoprazole  40 mg Oral Daily  . phosphorus  500 mg Oral TID  . sodium chloride flush  3 mL Intravenous Q12H  . spironolactone  100 mg Oral Daily  . vitamin B-12  1,000 mcg Oral Daily   Continuous Infusions:  Assessment & Plan:    1.  Anasarca in the setting of hypoalbuminemia, prior history of malignancies: Patient is s/p thoracentesis and paracentesis in this hospital course.  Albumin level at 2.3.  Currently saturating well on room air.  Continue oral diuretics.  Echo with normal EF.  2.  Non-Hodgkin's lymphoma: In remission.  S/p chemo.  CBC shows pancytopenia.  3.   Colon cancer: S/p right colectomy.  4.  CKD stage II: Patient's creatinine appears to be stable around 0.8 to 1.  Monitor on diuretics  5.  Mild hypokalemia/hypophosphatemia: In the setting of diuretics.  Currently on K-Phos 3 times daily  6.  GERD: Mild epigastric tenderness secondary to gastritis versus recent paracentesis.  Continue PPI.  Will add sucralfate and monitor response  7.  Hyperlipidemia: Resume home meds-gemfibrozil   8.  Elevated LFTs: Transaminases only minimally elevated but ALP significantly elevated to 270-320/total bilirubin around 1.5 likely due to cirrhotic changes noted in the liver on CT scan done this admission.No h/o alcohol. Will send hepatitis profile.  9.  Pancytopenia: Discontinue Remeron as can worsen pancytopenia.  Avoid bone marrow suppressants.  She does have history of NHL and is s/p chemotherapy.  Could be related to cirrhosis/mild splenomegaly noted on CT as well.  DVT prophylaxis: SCDs Code Status: DNR Family / Patient Communication: d/w son and updated Disposition Plan: PT/OTrecommending SNF vs 24 hrs supervision/HH PT.  Case management looking into options. Per son cannot provide 24 hr supervision.     LOS: 4 days    Time spent: 35 minutes    Guilford Shi, MD [facility] Pager [phone]  If 7PM-7AM, please contact night-coverage [email or web address] Password [facility] 10/16/2019, 8:31 AM

## 2019-10-17 LAB — BASIC METABOLIC PANEL
Anion gap: 8 (ref 5–15)
BUN: 18 mg/dL (ref 8–23)
CO2: 29 mmol/L (ref 22–32)
Calcium: 7.7 mg/dL — ABNORMAL LOW (ref 8.9–10.3)
Chloride: 100 mmol/L (ref 98–111)
Creatinine, Ser: 1.19 mg/dL — ABNORMAL HIGH (ref 0.44–1.00)
GFR calc Af Amer: 50 mL/min — ABNORMAL LOW (ref >60)
GFR calc non Af Amer: 43 mL/min — ABNORMAL LOW (ref >60)
Glucose, Bld: 91 mg/dL (ref 70–99)
Potassium: 4.2 mmol/L (ref 3.5–5.1)
Sodium: 137 mmol/L (ref 135–145)

## 2019-10-17 LAB — SARS CORONAVIRUS 2 (TAT 6-24 HRS): SARS Coronavirus 2: NEGATIVE

## 2019-10-17 LAB — CBC
HCT: 31 % — ABNORMAL LOW (ref 36.0–46.0)
Hemoglobin: 10.5 g/dL — ABNORMAL LOW (ref 12.0–15.0)
MCH: 34 pg (ref 26.0–34.0)
MCHC: 33.9 g/dL (ref 30.0–36.0)
MCV: 100.3 fL — ABNORMAL HIGH (ref 80.0–100.0)
Platelets: 86 10*3/uL — ABNORMAL LOW (ref 150–400)
RBC: 3.09 MIL/uL — ABNORMAL LOW (ref 3.87–5.11)
RDW: 14.8 % (ref 11.5–15.5)
WBC: 2.3 10*3/uL — ABNORMAL LOW (ref 4.0–10.5)
nRBC: 0 % (ref 0.0–0.2)

## 2019-10-17 LAB — PHOSPHORUS: Phosphorus: 3.8 mg/dL (ref 2.5–4.6)

## 2019-10-17 LAB — HEPATITIS B SURFACE ANTIGEN: Hepatitis B Surface Ag: NONREACTIVE

## 2019-10-17 NOTE — Progress Notes (Addendum)
PROGRESS NOTE    Erica Escobar  WRU:[id number]  DOB: 10/11/1936  DOA: 10/12/2019 PCP: Deland Pretty, MD  Brief Narrative:  83 year old female with history of non-Hodgkin's lymphomatreated with chemo on remission,colon cancer status post right colectomy, stage III chronic kidney disease, hyperlipidemia, anemia and GERD presented with c/o dyspnea x1 month.Patient hadseveral weeks of progressive lower extremity and abdominal swelling. She was seen by her PCP noted to be hypoxic with saturation 83% on room air and sent to ER. ED course: Hemodynamically stable. Needed 2 L oxygen with saturation 100%. LFTs and renal function test normal with elevated ALP of 325, Bilirubin 1.6, albumin 2.3 . WBC 2.8, platelets 109K. Hospital course:Underwent paracentesis with removal of 4.2L transudative fluid and improvement in oxygenation.She has minimal pleural effusion. Currently oxygenating 93% on room air. Now being treated with diuretics.Echocardiogram with normal ejection fraction. Patient planned to be discharged on oral Lasix/Aldactone and to f/u GI clinic for further work-up including autoimmune diseases.Patient is not a safe discharge home due to unsteady gait and multiple falls and she lives alone.PT/OT recommending SNF vs 24 hrs supervision/HH PT.   Subjective:  Patient resting comfortably. Very hard of hearing. Saturating well on RA.  Saturating well on room air.  Denies any acute complaints.  Objective: Vitals:   10/16/19 1248 10/16/19 2114 10/17/19 0532 10/17/19 1256  BP: 111/73 136/79 117/78 137/82  Pulse: 82 79 71 79  Resp: 16 16 16 19   Temp: 99.4 F (37.4 C) 98.7 F (37.1 C) 97.9 F (36.6 C) 98.9 F (37.2 C)  TempSrc: Oral Oral Oral Oral  SpO2: 97% 96% 97% 92%  Weight:   66 kg   Height:        Intake/Output Summary (Last 24 hours) at 10/17/2019 1659 Last data filed at 10/17/2019 0917 Gross per 24 hour  Intake 480 ml  Output 2300 ml  Net -1820 ml   Filed Weights   10/15/19 0500 10/16/19 0522 10/17/19 0532  Weight: 72 kg 68.3 kg 66 kg    Physical Examination:  General exam: Appears calm and comfortable  Respiratory system: Clear to auscultation. Respiratory effort normal. Cardiovascular system: S1 & S2 heard, RRR. No JVD, murmurs, rubs, gallops or clicks. No pedal edema. Gastrointestinal system: Abdomen is nondistended, soft, mildly tender in epigastrium. No organomegaly or masses felt. Normal bowel sounds heard. Central nervous system: Alert and oriented. No focal neurological deficits. Extremities: Symmetric 5 x 5 power.  Trace leg edema Skin: No rashes, lesions or ulcers Psychiatry: Judgement and insight appear normal. Mood & affect appropriate.     Data Reviewed: I have personally reviewed following labs and imaging studies  CBC: Recent Labs  Lab 10/12/19 1850 10/13/19 0453 10/14/19 0527 10/15/19 0520 10/17/19 0448  WBC 2.8* 2.3* 2.6* 2.6* 2.3*  NEUTROABS 1.8  --  1.5* 1.4*  --   HGB 11.2* 9.7* 10.4* 10.8* 10.5*  HCT 33.3* 27.9* 30.9* 31.4* 31.0*  MCV 98.8 98.2 99.7 98.7 100.3*  PLT 109* 82* 92* 90* 86*   Basic Metabolic Panel: Recent Labs  Lab 10/12/19 1850 10/12/19 2050 10/13/19 0453 10/14/19 0527 10/15/19 0520 10/17/19 0448  NA 142  --  141 141 142 137  K 3.3*  --  3.7 3.1* 3.4* 4.2  CL 109  --  110 107 103 100  CO2 25  --  24 25 28 29   GLUCOSE 95  --  87 96 90 91  BUN 16  --  15 14 17 18   CREATININE 1.08*  --  0.98 0.87 1.00 1.19*  CALCIUM 8.2*  --  8.0* 7.7* 7.8* 7.7*  MG  --  1.7  --  1.5* 1.6*  --   PHOS  --   --   --  2.3*  --  3.8   GFR: Estimated Creatinine Clearance: 33.8 mL/min (A) (by C-G formula based on SCr of 1.19 mg/dL (H)). Liver Function Tests: Recent Labs  Lab 10/12/19 1850 10/13/19 0453 10/14/19 0527  AST 48* 42* 41  ALT 29 25 24   ALKPHOS 325* 271* 271*  BILITOT 1.6* 1.3* 1.4*  PROT 4.9* 4.3* 4.3*  ALBUMIN 2.7* 2.3* 2.3*   Recent Labs  Lab 10/12/19 1850  LIPASE 54*   No results  for input(s): AMMONIA in the last 168 hours. Coagulation Profile: Recent Labs  Lab 10/13/19 0453  INR 1.0   Cardiac Enzymes: No results for input(s): CKTOTAL, CKMB, CKMBINDEX, TROPONINI in the last 168 hours. BNP (last 3 results) No results for input(s): PROBNP in the last 8760 hours. HbA1C: No results for input(s): HGBA1C in the last 72 hours. CBG: No results for input(s): GLUCAP in the last 168 hours. Lipid Profile: No results for input(s): CHOL, HDL, LDLCALC, TRIG, CHOLHDL, LDLDIRECT in the last 72 hours. Thyroid Function Tests: No results for input(s): TSH, T4TOTAL, FREET4, T3FREE, THYROIDAB in the last 72 hours. Anemia Panel: No results for input(s): VITAMINB12, FOLATE, FERRITIN, TIBC, IRON, RETICCTPCT in the last 72 hours. Sepsis Labs: No results for input(s): PROCALCITON, LATICACIDVEN in the last 168 hours.  Recent Results (from the past 240 hour(s))  SARS CORONAVIRUS 2 (TAT 6-24 HRS) Nasopharyngeal Nasopharyngeal Swab     Status: None   Collection Time: 10/12/19  9:56 PM   Specimen: Nasopharyngeal Swab  Result Value Ref Range Status   SARS Coronavirus 2 NEGATIVE NEGATIVE Final    Comment: (NOTE) SARS-CoV-2 target nucleic acids are NOT DETECTED. The SARS-CoV-2 RNA is generally detectable in upper and lower respiratory specimens during the acute phase of infection. Negative results do not preclude SARS-CoV-2 infection, do not rule out co-infections with other pathogens, and should not be used as the sole basis for treatment or other patient management decisions. Negative results must be combined with clinical observations, patient history, and epidemiological information. The expected result is Negative. Fact Sheet for Patients: SugarRoll.be Fact Sheet for Healthcare Providers: https://www.woods-mathews.com/ This test is not yet approved or cleared by the [place] FDA and  has been authorized for detection and/or  diagnosis of SARS-CoV-2 by FDA under an Emergency Use Authorization (EUA). This EUA will remain  in effect (meaning this test can be used) for the duration of the COVID-19 declaration under Section 56 4(b)(1) of the Act, 21 U.S.C. section 360bbb-3(b)(1), unless the authorization is terminated or revoked sooner. Performed at [facility], [place] [address], [place], [place] [address]   Gram stain     Status: None   Collection Time: 10/13/19 10:12 AM   Specimen: Fluid  Result Value Ref Range Status   Specimen Description FLUID  Final   Special Requests NONE  Final   Gram Stain   Final    WBC PRESENT, PREDOMINANTLY MONONUCLEAR NO ORGANISMS SEEN CYTOSPIN SMEAR Performed at [facility], [place] [address], [place], [place] [address]    Report Status 10/13/2019 FINAL  Final  Culture, body fluid-bottle     Status: None (Preliminary result)   Collection Time: 10/13/19 10:12 AM   Specimen: Fluid  Result Value Ref Range Status   Specimen Description FLUID  Final   Special Requests NONE  Final   Culture   Final    NO GROWTH 4 DAYS Performed at [facility], [place] [address] [place], [place] [address]    Report Status PENDING  Incomplete  SARS CORONAVIRUS 2 (TAT 6-24 HRS) Nasopharyngeal Nasopharyngeal Swab     Status: None   Collection Time: 10/17/19  8:06 AM   Specimen: Nasopharyngeal Swab  Result Value Ref Range Status   SARS Coronavirus 2 NEGATIVE NEGATIVE Final    Comment: (NOTE) SARS-CoV-2 target nucleic acids are NOT DETECTED. The SARS-CoV-2 RNA is generally detectable in upper and lower respiratory specimens during the acute phase of infection. Negative results do not preclude SARS-CoV-2 infection, do not rule out co-infections with other pathogens, and should not be used as the sole basis for treatment or other patient management decisions. Negative results must be combined with clinical observations, patient history, and epidemiological information. The expected  result is Negative. Fact Sheet for Patients: SugarRoll.be Fact Sheet for Healthcare Providers: https://www.woods-mathews.com/ This test is not yet approved or cleared by the [place] FDA and  has been authorized for detection and/or diagnosis of SARS-CoV-2 by FDA under an Emergency Use Authorization (EUA). This EUA will remain  in effect (meaning this test can be used) for the duration of the COVID-19 declaration under Section 56 4(b)(1) of the Act, 21 U.S.C. section 360bbb-3(b)(1), unless the authorization is terminated or revoked sooner. Performed at [facility], [place] [address] [place], [place] [address]       Radiology Studies: No results found.      Scheduled Meds: . feeding supplement (ENSURE ENLIVE)  237 mL Oral Daily  . fludrocortisone  0.2 mg Oral Daily  . furosemide  20 mg Intravenous Q12H  . gemfibrozil  600 mg Oral q morning - 10a  . multivitamin with minerals  1 tablet Oral Daily  . pantoprazole  40 mg Oral Daily  . phosphorus  500 mg Oral TID  . sodium chloride flush  3 mL Intravenous Q12H  . spironolactone  100 mg Oral Daily  . sucralfate  1 g Oral TID WC & HS  . vitamin B-12  1,000 mcg Oral Daily   Continuous Infusions:  Assessment & Plan:    1.  Anasarca in the setting of hypoalbuminemia, prior history of malignancies: Patient is s/p  paracentesis in this hospital course.  Albumin level at 2.3.  Currently saturating well on room air.  Continue diuretics.  Echo with normal EF.  2.  Non-Hodgkin's lymphoma: In remission.  S/p chemo.  CBC shows pancytopenia.  3.  Colon cancer: S/p right colectomy.  4.  CKD stage II: Patient's creatinine appears to be stable around 0.8 to 1.  Monitor on diuretics  5.  Mild hypokalemia/hypophosphatemia: In the setting of diuretics.  Currently on K-Phos 3 times daily  6.  GERD: Mild epigastric tenderness secondary to gastritis versus recent paracentesis.  Continue  PPI.  Added sucralfate and doing better  7.  Hyperlipidemia: Resume home meds-gemfibrozil   8. Non alcoholic Liver cirrhosis: Transaminases only minimally elevated but ALP significantly elevated to 270-320/total bilirubin around 1.5 likely due to cirrhotic changes noted in the liver on CT scan done this admission.No h/o alcohol. Negative hepatitis profile. F/U GI as outpatient for further w/u and recommendations.  9.  Pancytopenia: Discontinue Remeron as can worsen pancytopenia.  Avoid bone marrow suppressants.  She does have history of NHL and is s/p chemotherapy.  Could be related to  cirrhosis/mild splenomegaly noted on CT as well.  DVT prophylaxis: SCDs Code Status: DNR Family / Patient Communication: d/w son and updated Disposition Plan: PT/OTrecommending SNF vs 24 hrs supervision/HH PT.  Case management looking into options. Per son cannot provide 24 hr supervision.     LOS: 5 days    Time spent: 35 minutes    Guilford Shi, MD [facility] Pager [phone]  If 7PM-7AM, please contact night-coverage [email or web address] Password [email or web address] 10/17/2019, 4:59 PM

## 2019-10-17 NOTE — Care Management Important Message (Signed)
Important Message  Patient Details IM Letter given to Dessa Phi RN to present to the Patient Name: Erica Escobar MRN: [id number] Date of Birth: 1936/02/28   Medicare Important Message Given:  Yes     [name] 10/17/2019, 10:22 AM

## 2019-10-17 NOTE — TOC Progression Note (Signed)
Transition of Care [facility]) - Progression Note    Patient Details  Name: CELESTIA DUVA MRN: [id number] Date of Birth: 03/21/1936  [facility]) CM/SW Contact  Sondi Desch, Juliann Pulse, RN Phone Number: 10/17/2019, 3:15 PM  Clinical Narrative:  [facility] [place] Roanna Raider following accepted;auth initiated-await response [id number].    Expected Discharge Plan: [place] Barriers to Discharge: (insurance Josem Kaufmann)  Expected Discharge Plan and Services Expected Discharge Plan: [place]   Discharge Planning Services: CM Consult Post Acute Care Choice: [place] Living arrangements for the past 2 months: Single Family Home                                       Social Determinants of Health (SDOH) Interventions    Readmission Risk Interventions No flowsheet data found.

## 2019-10-17 NOTE — Plan of Care (Signed)
  Problem: Health Behavior/Discharge Planning: Goal: Ability to manage health-related needs will improve Outcome: Progressing   Problem: Clinical Measurements: Goal: Ability to maintain clinical measurements within normal limits will improve Outcome: Progressing Goal: Will remain free from infection Outcome: Progressing Goal: Diagnostic test results will improve Outcome: Progressing Goal: Respiratory complications will improve Outcome: Progressing Goal: Cardiovascular complication will be avoided Outcome: Progressing   Problem: Nutrition: Goal: Adequate nutrition will be maintained Outcome: Progressing   Problem: Elimination: Goal: Will not experience complications related to bowel motility Outcome: Progressing Goal: Will not experience complications related to urinary retention Outcome: Progressing   Problem: Safety: Goal: Ability to remain free from injury will improve Outcome: Progressing   Problem: Skin Integrity: Goal: Risk for impaired skin integrity will decrease Outcome: Progressing

## 2019-10-17 NOTE — NC FL2 (Signed)
[place] LEVEL OF CARE SCREENING TOOL     IDENTIFICATION  Patient Name: Erica Escobar Birthdate: [birth year] Sex: female Admission Date (Current Location): 10/12/2019  [facility] and [place] Number:  Herbalist and Address:  [facility],  [place] [address], [place]      Provider Number: [id number]  Attending Physician Name and Address:  Guilford Shi, MD  Relative Name and Phone Number:  Alveda Reasons [phone]    Current Level of Care: Hospital Recommended Level of Care: [place] Prior Approval Number:    Date Approved/Denied:   PASRR Number: [id number]  Discharge Plan: SNF    Current Diagnoses: Patient Active Problem List   Diagnosis Date Noted  . Acute respiratory failure with hypoxia ([place]) 10/12/2019  . Pleural effusion on right 10/12/2019  . Cirrhosis of liver with ascites ([place]) 10/12/2019  . Pancytopenia ([place]) 10/12/2019  . Malnutrition of moderate degree 08/30/2018  . Weakness 08/30/2018  . Symptomatic anemia 08/29/2018  . Acute kidney injury ([place]) 08/14/2018  . Dehydration 08/14/2018  . FTT (failure to thrive) in adult 08/14/2018  . Pressure injury of skin 08/14/2018  . Acute back pain 07/23/2018  . CKD (chronic kidney disease), stage III 07/23/2018  . Anorexia 07/23/2018  . Hyperlipidemia 07/23/2018  . Large cell lymphoma ([place]) 07/20/2018  . Metastatic cancer ([place]) 07/12/2018  . Chronic back pain 07/12/2018  . Pathologic rib fracture, initial encounter 07/12/2018  . Chest wall mass 07/12/2018  . Cancer of ascending colon s/p robotic colectomy 02/15/2015 02/15/2015  . Restless leg syndrome   . Depression   . Insomnia   . COPD (chronic obstructive pulmonary disease) ([place])   . GERD (gastroesophageal reflux disease)     Orientation RESPIRATION BLADDER Height & Weight     Self, Time, Situation  Normal Continent Weight: 66 kg Height:  5' 3"  (160 cm)  BEHAVIORAL SYMPTOMS/MOOD  NEUROLOGICAL BOWEL NUTRITION STATUS      Continent Diet(Heart Healthy)  AMBULATORY STATUS COMMUNICATION OF NEEDS Skin   Limited Assist Verbally Normal                       Personal Care Assistance Level of Assistance  Bathing, Feeding, Dressing Bathing Assistance: Limited assistance Feeding assistance: Limited assistance Dressing Assistance: Limited assistance     Functional Limitations Info  Sight, Hearing, Speech Sight Info: Impaired(eyeglasses) Hearing Info: Impaired(bilalteral HOH) Speech Info: Impaired(Bilateral uper/lower dentures)    SPECIAL CARE FACTORS FREQUENCY  PT (By licensed PT), OT (By licensed OT)     PT Frequency: 5x week OT Frequency: 5x week            Contractures Contractures Info: Not present    Additional Factors Info  Code Status, Allergies Code Status Info: DNR Allergies Info: : Azithromycin, Allopurinol, Cephalexin, Codeine, Gabapentin, Niaspan Niacin, Oxybutynin, Paxil Paroxetine Hcl, Penicillins, Pentazocine, Tizanidine, Tramadol, Vicodin Hydrocodone-acetaminophen           Current Medications (10/17/2019):  This is the current hospital active medication list Current Facility-Administered Medications  Medication Dose Route Frequency Provider Last Rate Last Dose  . feeding supplement (ENSURE ENLIVE) (ENSURE ENLIVE) liquid 237 mL  237 mL Oral Daily Lenore Cordia, MD   237 mL at 10/17/19 0915  . fludrocortisone (FLORINEF) tablet 0.2 mg  0.2 mg Oral Daily Zada Finders R, MD   0.2 mg at 10/17/19 0914  . furosemide (LASIX) injection 20 mg  20 mg Intravenous Q12H Patel, Vishal R,  MD   20 mg at 10/17/19 0915  . gemfibrozil (LOPID) tablet 600 mg  600 mg Oral q morning - 10a Zada Finders R, MD   600 mg at 10/17/19 0915  . multivitamin with minerals tablet 1 tablet  1 tablet Oral Daily Lenore Cordia, MD   1 tablet at 10/17/19 0914  . oxyCODONE (Oxy IR/ROXICODONE) immediate release tablet 5 mg  5 mg Oral Q6H PRN Barb Merino, MD   5 mg at  10/17/19 1309  . pantoprazole (PROTONIX) EC tablet 40 mg  40 mg Oral Daily Lenore Cordia, MD   40 mg at 10/17/19 0915  . phosphorus (K PHOS NEUTRAL) tablet 500 mg  500 mg Oral TID Barb Merino, MD   500 mg at 10/17/19 0914  . sodium chloride flush (NS) 0.9 % injection 3 mL  3 mL Intravenous Q12H Zada Finders R, MD   3 mL at 10/17/19 0915  . spironolactone (ALDACTONE) tablet 100 mg  100 mg Oral Daily Barb Merino, MD   100 mg at 10/17/19 0915  . sucralfate (CARAFATE) tablet 1 g  1 g Oral TID WC & HS Guilford Shi, MD   1 g at 10/17/19 1300  . vitamin B-12 (CYANOCOBALAMIN) tablet 1,000 mcg  1,000 mcg Oral Daily Lenore Cordia, MD   1,000 mcg at 10/17/19 0254     Discharge Medications: Please see discharge summary for a list of discharge medications.  Relevant Imaging Results:  Relevant Lab Results:   Additional Information 245 [address] [place], [place]

## 2019-10-17 NOTE — NC FL2 (Signed)
[place] LEVEL OF CARE SCREENING TOOL     IDENTIFICATION  Patient Name: Erica Escobar Birthdate: 25-Sep-1936 Sex: female Admission Date (Current Location): 10/12/2019  [facility] and [place] Number:  Herbalist and Address:  [facility],  [place] [address] [place]      Provider Number: [id number]  Attending Physician Name and Address:  Guilford Shi, MD  Relative Name and Phone Number:  Alveda Reasons [phone]    Current Level of Care: Hospital Recommended Level of Care: [place] Prior Approval Number:    Date Approved/Denied:   PASRR Number:    Discharge Plan: SNF    Current Diagnoses: Patient Active Problem List   Diagnosis Date Noted  . Acute respiratory failure with hypoxia ([place]) 10/12/2019  . Pleural effusion on right 10/12/2019  . Cirrhosis of liver with ascites ([place]) 10/12/2019  . Pancytopenia ([place]) 10/12/2019  . Malnutrition of moderate degree 08/30/2018  . Weakness 08/30/2018  . Symptomatic anemia 08/29/2018  . Acute kidney injury ([place]) 08/14/2018  . Dehydration 08/14/2018  . FTT (failure to thrive) in adult 08/14/2018  . Pressure injury of skin 08/14/2018  . Acute back pain 07/23/2018  . CKD (chronic kidney disease), stage III 07/23/2018  . Anorexia 07/23/2018  . Hyperlipidemia 07/23/2018  . Large cell lymphoma ([place]) 07/20/2018  . Metastatic cancer ([place]) 07/12/2018  . Chronic back pain 07/12/2018  . Pathologic rib fracture, initial encounter 07/12/2018  . Chest wall mass 07/12/2018  . Cancer of ascending colon s/p robotic colectomy 02/15/2015 02/15/2015  . Restless leg syndrome   . Depression   . Insomnia   . COPD (chronic obstructive pulmonary disease) ([place])   . GERD (gastroesophageal reflux disease)     Orientation RESPIRATION BLADDER Height & Weight     Self, Time, Situation  Normal Continent Weight: 66 kg Height:  5' 3"  (160 cm)  BEHAVIORAL SYMPTOMS/MOOD NEUROLOGICAL BOWEL  NUTRITION STATUS      Continent Diet(Heart Healthy)  AMBULATORY STATUS COMMUNICATION OF NEEDS Skin   Limited Assist Verbally Normal                       Personal Care Assistance Level of Assistance  Bathing, Feeding, Dressing Bathing Assistance: Limited assistance Feeding assistance: Limited assistance Dressing Assistance: Limited assistance     Functional Limitations Info  Sight, Hearing, Speech Sight Info: Impaired(eyeglasses) Hearing Info: Impaired(bilalteral HOH) Speech Info: Impaired(Bilateral uper/lower dentures)    SPECIAL CARE FACTORS FREQUENCY  PT (By licensed PT), OT (By licensed OT)     PT Frequency: 5x week OT Frequency: 5x week            Contractures Contractures Info: Not present    Additional Factors Info  Code Status, Allergies Code Status Info: DNR Allergies Info: : Azithromycin, Allopurinol, Cephalexin, Codeine, Gabapentin, Niaspan Niacin, Oxybutynin, Paxil Paroxetine Hcl, Penicillins, Pentazocine, Tizanidine, Tramadol, Vicodin Hydrocodone-acetaminophen           Current Medications (10/17/2019):  This is the current hospital active medication list Current Facility-Administered Medications  Medication Dose Route Frequency Provider Last Rate Last Dose  . feeding supplement (ENSURE ENLIVE) (ENSURE ENLIVE) liquid 237 mL  237 mL Oral Daily Lenore Cordia, MD   237 mL at 10/17/19 0915  . fludrocortisone (FLORINEF) tablet 0.2 mg  0.2 mg Oral Daily Zada Finders R, MD   0.2 mg at 10/17/19 0914  . furosemide (LASIX) injection 20 mg  20 mg Intravenous Q12H Posey Pronto, Vishal  R, MD   20 mg at 10/17/19 0915  . gemfibrozil (LOPID) tablet 600 mg  600 mg Oral q morning - 10a Zada Finders R, MD   600 mg at 10/17/19 0915  . multivitamin with minerals tablet 1 tablet  1 tablet Oral Daily Lenore Cordia, MD   1 tablet at 10/17/19 0914  . oxyCODONE (Oxy IR/ROXICODONE) immediate release tablet 5 mg  5 mg Oral Q6H PRN Barb Merino, MD   5 mg at 10/16/19 2458  .  pantoprazole (PROTONIX) EC tablet 40 mg  40 mg Oral Daily Lenore Cordia, MD   40 mg at 10/17/19 0915  . phosphorus (K PHOS NEUTRAL) tablet 500 mg  500 mg Oral TID Barb Merino, MD   500 mg at 10/17/19 0914  . sodium chloride flush (NS) 0.9 % injection 3 mL  3 mL Intravenous Q12H Zada Finders R, MD   3 mL at 10/17/19 0915  . spironolactone (ALDACTONE) tablet 100 mg  100 mg Oral Daily Barb Merino, MD   100 mg at 10/17/19 0915  . sucralfate (CARAFATE) tablet 1 g  1 g Oral TID WC & HS Guilford Shi, MD   1 g at 10/17/19 0914  . vitamin B-12 (CYANOCOBALAMIN) tablet 1,000 mcg  1,000 mcg Oral Daily Lenore Cordia, MD   1,000 mcg at 10/17/19 0998     Discharge Medications: Please see discharge summary for a list of discharge medications.  Relevant Imaging Results:  Relevant Lab Results:   Additional Information 245 [address] [place], [place]

## 2019-10-17 NOTE — TOC Initial Note (Signed)
Transition of Care [facility]) - Initial/Assessment Note    Patient Details  Name: Erica Escobar MRN: [id number] Date of Birth: 10-10-1936  Transition of [facility]) CM/SW Contact:    Dessa Phi, RN Phone Number: 10/17/2019, 12:31 PM  Clinical Narrative:  Patient HOH-defers to son Roy-Roy in agreement to SNF-faxed out He does not want [facility];prefers [facility]-await bed offers. Will need covid results.               Expected Discharge Plan: Skilled Nursing Facility Barriers to Discharge: Continued Medical Work up   Patient Goals and CMS Choice Patient states their goals for this hospitalization and ongoing recovery are:: Patient a+0x3 defers to son Carloyn Manner for decisions.Son Carloyn Manner in agreement to go to [employer].gov Compare Post Acute Care list provided to:: Patient Represenative (must comment)(Roy son) Choice offered to / list presented to : Adult Children  Expected Discharge Plan and Services Expected Discharge Plan: [place]   Discharge Planning Services: CM Consult Post Acute Care Choice: [place] Living arrangements for the past 2 months: Single Family Home                                      Prior Living Arrangements/Services Living arrangements for the past 2 months: Single Family Home Lives with:: Self Patient language and need for interpreter reviewed:: Yes Do you feel safe going back to the place where you live?: Yes      Need for Family Participation in Patient Care: No (Comment) Care giver support system in place?: Yes (comment)   Criminal Activity/Legal Involvement Pertinent to Current Situation/Hospitalization: No - Comment as needed  Activities of Daily Living Home Assistive Devices/Equipment: Wheelchair, Environmental consultant (specify type), Bedside commode/3-in-1, Eyeglasses, Dentures (specify type) ADL Screening (condition at time of admission) Patient's cognitive ability adequate to safely complete daily  activities?: No Is the patient deaf or have difficulty hearing?: Yes Does the patient have difficulty seeing, even when wearing glasses/contacts?: No Does the patient have difficulty concentrating, remembering, or making decisions?: Yes Patient able to express need for assistance with ADLs?: Yes Does the patient have difficulty dressing or bathing?: No Independently performs ADLs?: Yes (appropriate for developmental age) Does the patient have difficulty walking or climbing stairs?: Yes Weakness of Legs: Both Weakness of Arms/Hands: Both  Permission Sought/Granted Permission sought to share information with : Case Manager Permission granted to share information with : Yes, Verbal Permission Granted  Share Information with NAMEJuliann Pulse [facility] [phone]           Emotional Assessment Appearance:: Appears stated age Attitude/Demeanor/Rapport: Gracious Affect (typically observed): Accepting Orientation: : Oriented to Self, Oriented to Place, Oriented to  Time, Oriented to Situation Alcohol / Substance Use: Not Applicable Psych Involvement: No (comment)  Admission diagnosis:  SOB (shortness of breath) [R06.02] Other ascites [R18.8] Ascites [R18.8] Patient Active Problem List   Diagnosis Date Noted  . Acute respiratory failure with hypoxia ([place]) 10/12/2019  . Pleural effusion on right 10/12/2019  . Cirrhosis of liver with ascites ([place]) 10/12/2019  . Pancytopenia ([place]) 10/12/2019  . Malnutrition of moderate degree 08/30/2018  . Weakness 08/30/2018  . Symptomatic anemia 08/29/2018  . Acute kidney injury ([place]) 08/14/2018  . Dehydration 08/14/2018  . FTT (failure to thrive) in adult 08/14/2018  . Pressure injury of skin 08/14/2018  . Acute back pain 07/23/2018  . CKD (chronic kidney disease),  stage III 07/23/2018  . Anorexia 07/23/2018  . Hyperlipidemia 07/23/2018  . Large cell lymphoma ([place]) 07/20/2018  . Metastatic cancer ([place]) 07/12/2018  . Chronic back pain 07/12/2018  .  Pathologic rib fracture, initial encounter 07/12/2018  . Chest wall mass 07/12/2018  . Cancer of ascending colon s/p robotic colectomy 02/15/2015 02/15/2015  . Restless leg syndrome   . Depression   . Insomnia   . COPD (chronic obstructive pulmonary disease) ([place])   . GERD (gastroesophageal reflux disease)    PCP:  Deland Pretty, MD Pharmacy:   [facility] [address], [place] [address] Phone: [phone] Fax: [place], [place] [address] Phone: [phone] Fax: [phone]  [id number] [id number]: [phone]: [phone]    Social Determinants of Health (SDOH) Interventions    Readmission Risk Interventions No flowsheet data found.

## 2019-10-17 NOTE — Progress Notes (Signed)
Physical Therapy Treatment Patient Details Name: Erica Escobar MRN: [id number] DOB: May 15, 1936 Today's Date: 10/17/2019    History of Present Illness Pt admitted through ED 10/11/19 with acute respiratory failure with hypoxia 2* ascites and fluid overload; Paracentesis performed 10/30; Pt with hx of COPD, Colon CA, non-hodgkins Lymphoma, CKD, and peripheral neuropathy.    PT Comments    Pt in bed easily aroused.  Pleasant but very HOH.  Assisted OOB. General bed mobility comments: required increased assist supine to sit for upper body due to increased c/o back pain.  Required lower body assist bacvk to bed.  Positioned to comfort. General transfer comment: 25% VC's on proper hand placement and assist to steady during turns.  LOB x 2 in bathroom backing up to touilet then turning around to reach sink.  Therapist recovered.  50% VC's on proper walker use with turns. General Gait Details: pt uses a rollator at home "sometimes".  Amb to and from bathroom a limited distance due to c/o fatigue.  Unsteady.  25% VC's on proper walker to self distance and safety with turns using walker throughout.  HIGH FALL RISK. Pt will need ST Rehab at SNF prior to returning home alone.    Follow Up Recommendations  SNF     Equipment Recommendations  None recommended by PT    Recommendations for Other Services       Precautions / Restrictions Precautions Precautions: Fall Precaution Comments: patient has had multiple falls, very HOH Restrictions Weight Bearing Restrictions: No    Mobility  Bed Mobility Overal bed mobility: Needs Assistance Bed Mobility: Supine to Sit;Sit to Supine     Supine to sit: Min assist Sit to supine: Min assist;Mod assist   General bed mobility comments: required increased assist supine to sit for upper body due to increased c/o back pain.  Required lower body assist bacvk to bed.  Positioned to comfort.  Transfers Overall transfer level: Needs assistance Equipment  used: Rolling walker (2 wheeled) Transfers: Sit to/from [facility] Sit to Stand: Min assist Stand pivot transfers: Min assist;Mod assist       General transfer comment: 25% VC's on proper hand placement and assist to steady during turns.  LOB x 2 in bathroom backing up to touilet then turning around to reach sink.  Therapist recovered.  50% VC's on proper walker use with turns.  Ambulation/Gait Ambulation/Gait assistance: Min assist Gait Distance (Feet): 22 Feet(11 feet to and from bathroom) Assistive device: Rolling walker (2 wheeled) Gait Pattern/deviations: Step-through pattern;Decreased step length - right;Decreased step length - left;Shuffle;Trunk flexed Gait velocity: decr   General Gait Details: pt uses a rollator at home "sometimes".  Amb to and from bathroom a limited distance due to c/o fatigue.  Unsteady.  25% VC's on proper walker to self distance and safety with turns using walker throughout.  HIGH FALL RISK.   Stairs             Wheelchair Mobility    Modified Rankin (Stroke Patients Only)       Balance                                            Cognition Arousal/Alertness: Awake/alert Behavior During Therapy: WFL for tasks assessed/performed Overall Cognitive Status: Within Functional Limits for tasks assessed  General Comments: pleasant, sweet, very HOH      Exercises      General Comments        Pertinent Vitals/Pain Pain Assessment: Faces Faces Pain Scale: Hurts little more Pain Location: chronic back Pain Descriptors / Indicators: Aching;Sore Pain Intervention(s): Monitored during session;Premedicated before session;Repositioned    Home Living                      Prior Function            PT Goals (current goals can now be found in the care plan section) Progress towards PT goals: Progressing toward goals    Frequency    Min  3X/week      PT Plan Current plan remains appropriate    Co-evaluation              AM-PAC PT "6 Clicks" Mobility   Outcome Measure  Help needed turning from your back to your side while in a flat bed without using bedrails?: A Little Help needed moving from lying on your back to sitting on the side of a flat bed without using bedrails?: A Little Help needed moving to and from a bed to a chair (including a wheelchair)?: A Little Help needed standing up from a chair using your arms (e.g., wheelchair or bedside chair)?: A Little Help needed to walk in hospital room?: A Lot Help needed climbing 3-5 steps with a railing? : Total 6 Click Score: 15    End of Session Equipment Utilized During Treatment: Gait belt Activity Tolerance: Patient limited by fatigue Patient left: in bed;with call bell/phone within reach;with bed alarm set Nurse Communication: Mobility status PT Visit Diagnosis: Difficulty in walking, not elsewhere classified (R26.2);Muscle weakness (generalized) (M62.81);History of falling (Z91.81)     Time: 1435-1455 PT Time Calculation (min) (ACUTE ONLY): 20 min  Charges:  $Gait Training: 8-22 mins                     Rica Koyanagi  PTA Acute  Rehabilitation Services Pager      [phone] Office      [phone]

## 2019-10-18 DIAGNOSIS — I951 Orthostatic hypotension: Secondary | ICD-10-CM

## 2019-10-18 LAB — BASIC METABOLIC PANEL
Anion gap: 9 (ref 5–15)
BUN: 19 mg/dL (ref 8–23)
CO2: 32 mmol/L (ref 22–32)
Calcium: 7.7 mg/dL — ABNORMAL LOW (ref 8.9–10.3)
Chloride: 98 mmol/L (ref 98–111)
Creatinine, Ser: 1.2 mg/dL — ABNORMAL HIGH (ref 0.44–1.00)
GFR calc Af Amer: 49 mL/min — ABNORMAL LOW (ref >60)
GFR calc non Af Amer: 42 mL/min — ABNORMAL LOW (ref >60)
Glucose, Bld: 100 mg/dL — ABNORMAL HIGH (ref 70–99)
Potassium: 3.8 mmol/L (ref 3.5–5.1)
Sodium: 139 mmol/L (ref 135–145)

## 2019-10-18 LAB — HEPATITIS B SURFACE ANTIBODY, QUANTITATIVE: Hep B S AB Quant (Post): <3.1 m[IU]/mL — ABNORMAL LOW (ref >9.9)

## 2019-10-18 LAB — MAGNESIUM: Magnesium: 1.6 mg/dL — ABNORMAL LOW (ref 1.7–2.4)

## 2019-10-18 LAB — CULTURE, BODY FLUID W GRAM STAIN -BOTTLE: Culture: NO GROWTH

## 2019-10-18 LAB — PHOSPHORUS: Phosphorus: 4.5 mg/dL (ref 2.5–4.6)

## 2019-10-18 MED ORDER — MIDODRINE HCL 2.5 MG PO TABS
2.5000 mg | ORAL_TABLET | Freq: Two times a day (BID) | ORAL | Status: DC
  Administered 2019-10-18 – 2019-10-19 (×3): 2.5 mg via ORAL
  Filled 2019-10-18 (×5): qty 1

## 2019-10-18 MED ORDER — SPIRONOLACTONE 25 MG PO TABS
25.0000 mg | ORAL_TABLET | Freq: Two times a day (BID) | ORAL | Status: DC
  Administered 2019-10-18 – 2019-10-19 (×2): 25 mg via ORAL
  Filled 2019-10-18 (×2): qty 1

## 2019-10-18 MED ORDER — FUROSEMIDE 20 MG PO TABS
20.0000 mg | ORAL_TABLET | Freq: Every day | ORAL | Status: DC
  Administered 2019-10-19: 20 mg via ORAL
  Filled 2019-10-18 (×2): qty 1

## 2019-10-18 MED ORDER — ACETAMINOPHEN 325 MG PO TABS
650.0000 mg | ORAL_TABLET | Freq: Four times a day (QID) | ORAL | Status: DC | PRN
  Administered 2019-10-19: 650 mg via ORAL
  Filled 2019-10-18: qty 2

## 2019-10-18 MED ORDER — SODIUM CHLORIDE 0.9 % IV BOLUS
250.0000 mL | Freq: Once | INTRAVENOUS | Status: AC
  Administered 2019-10-18: 250 mL via INTRAVENOUS

## 2019-10-18 NOTE — TOC Progression Note (Signed)
Transition of Care [facility]) - Progression Note    Patient Details  Name: SHENIYA GARCIAPEREZ MRN: [id number] Date of Birth: 1936/09/15  [facility]) CM/SW Contact  Tymber Stallings, [place]n Pulse, RN Phone Number: 10/18/2019, 2:32 PM  Clinical Narrative: Patient has auth 11/4-11/6-ref#[id number].[facility] has bed available, covid neg 11/3. Await medical stability,d/c summary.      Expected Discharge Plan: [place] Barriers to Discharge: Continued Medical Work up(has auth, and bed @ [facility])  Expected Discharge Plan and Services Expected Discharge Plan: [place]   Discharge Planning Services: CM Consult Post Acute Care Choice: [place] Living arrangements for the past 2 months: Single Family Home                                       Social Determinants of Health (SDOH) Interventions    Readmission Risk Interventions No flowsheet data found.

## 2019-10-18 NOTE — Progress Notes (Signed)
PROGRESS NOTE    TILLEY FAETH  YQM:[id number]  DOB: July 23, 1936  DOA: 10/12/2019 PCP: Deland Pretty, MD  Brief Narrative:  83 year old female with history of non-Hodgkin's lymphomatreated with chemo on remission,colon cancer status post right colectomy, stage III chronic kidney disease, hyperlipidemia, anemia and GERD presented with c/o dyspnea x1 month.Patient hadseveral weeks of progressive lower extremity and abdominal swelling. She was seen by her PCP noted to be hypoxic with saturation 83% on room air and sent to ER. ED course: Hemodynamically stable. Needed 2 L oxygen with saturation 100%. LFTs and renal function test normal with elevated ALP of 325, Bilirubin 1.6, albumin 2.3 . WBC 2.8, platelets 109K. Ct abdomen showed changes consistent with cirrhosis of the liver with associated ascites and portal hypertension with evidence of recanalization of the umbilical vein-new since last exam. Moderate right pleural effusion,postsurgical changes consistent with right hemicolectomy with small bowel to colon surgery, sliding hiatal hernia, prominent spleen were also reported.  Hospital course:Underwent paracentesis with removal of 4.2L transudative fluid with improvement  in oxygenation.She has minimal pleural effusion. Currently oxygenating 93% on room air. Now being treated with diuretics.Echocardiogram with normal ejection fraction. Patient planned to be discharged on oral Lasix/Aldactone and to f/u GI clinic Dr Benson Norway for further work-up including autoimmune diseases.Patient is not a safe discharge home due to unsteady gait and multiple falls and she lives alone.PT/OT recommending SNF vs 24 hrs supervision/HH PT.   Subjective:  Patient resting comfortably. Very hard of hearing. Saturating well on RA. Reports improved but intermittent mid abdominal pain  .  Objective: Vitals:   10/17/19 2028 10/18/19 0617 10/18/19 0619 10/18/19 1351  BP: 133/69 137/70  116/68  Pulse: 78 71  82  Resp:  16 16  17   Temp: 99.3 F (37.4 C) 98.9 F (37.2 C)  98.7 F (37.1 C)  TempSrc: Oral Oral  Oral  SpO2: 95% 95%  100%  Weight:   66.7 kg   Height:        Intake/Output Summary (Last 24 hours) at 10/18/2019 1718 Last data filed at 10/18/2019 1300 Gross per 24 hour  Intake 240 ml  Output 1650 ml  Net -1410 ml   Filed Weights   10/16/19 0522 10/17/19 0532 10/18/19 0619  Weight: 68.3 kg 66 kg 66.7 kg    Physical Examination:  General exam: Appears calm and comfortable  Respiratory system: Clear to auscultation. Respiratory effort normal. Cardiovascular system: S1 & S2 heard, RRR. No JVD, murmurs, rubs, gallops or clicks. No pedal edema. Gastrointestinal system: Abdomen is nondistended, soft, mildly tender in epigastrium/mid abdomen. No organomegaly or masses felt. Normal bowel sounds heard. Central nervous system: Alert and oriented. No focal neurological deficits. Extremities: Symmetric 5 x 5 power.  Trace leg edema Skin: No rashes, lesions or ulcers Psychiatry: Judgement and insight appear normal. Mood & affect appropriate.     Data Reviewed: I have personally reviewed following labs and imaging studies  CBC: Recent Labs  Lab 10/12/19 1850 10/13/19 0453 10/14/19 0527 10/15/19 0520 10/17/19 0448  WBC 2.8* 2.3* 2.6* 2.6* 2.3*  NEUTROABS 1.8  --  1.5* 1.4*  --   HGB 11.2* 9.7* 10.4* 10.8* 10.5*  HCT 33.3* 27.9* 30.9* 31.4* 31.0*  MCV 98.8 98.2 99.7 98.7 100.3*  PLT 109* 82* 92* 90* 86*   Basic Metabolic Panel: Recent Labs  Lab 10/12/19 2050 10/13/19 0453 10/14/19 0527 10/15/19 0520 10/17/19 0448 10/18/19 0436  NA  --  141 141 142 137 139  K  --  3.7 3.1* 3.4* 4.2 3.8  CL  --  110 107 103 100 98  CO2  --  24 25 28 29  32  GLUCOSE  --  87 96 90 91 100*  BUN  --  15 14 17 18 19   CREATININE  --  0.98 0.87 1.00 1.19* 1.20*  CALCIUM  --  8.0* 7.7* 7.8* 7.7* 7.7*  MG 1.7  --  1.5* 1.6*  --  1.6*  PHOS  --   --  2.3*  --  3.8 4.5   GFR: Estimated Creatinine  Clearance: 33.7 mL/min (A) (by C-G formula based on SCr of 1.2 mg/dL (H)). Liver Function Tests: Recent Labs  Lab 10/12/19 1850 10/13/19 0453 10/14/19 0527  AST 48* 42* 41  ALT 29 25 24   ALKPHOS 325* 271* 271*  BILITOT 1.6* 1.3* 1.4*  PROT 4.9* 4.3* 4.3*  ALBUMIN 2.7* 2.3* 2.3*   Recent Labs  Lab 10/12/19 1850  LIPASE 54*   No results for input(s): AMMONIA in the last 168 hours. Coagulation Profile: Recent Labs  Lab 10/13/19 0453  INR 1.0   Cardiac Enzymes: No results for input(s): CKTOTAL, CKMB, CKMBINDEX, TROPONINI in the last 168 hours. BNP (last 3 results) No results for input(s): PROBNP in the last 8760 hours. HbA1C: No results for input(s): HGBA1C in the last 72 hours. CBG: No results for input(s): GLUCAP in the last 168 hours. Lipid Profile: No results for input(s): CHOL, HDL, LDLCALC, TRIG, CHOLHDL, LDLDIRECT in the last 72 hours. Thyroid Function Tests: No results for input(s): TSH, T4TOTAL, FREET4, T3FREE, THYROIDAB in the last 72 hours. Anemia Panel: No results for input(s): VITAMINB12, FOLATE, FERRITIN, TIBC, IRON, RETICCTPCT in the last 72 hours. Sepsis Labs: No results for input(s): PROCALCITON, LATICACIDVEN in the last 168 hours.  Recent Results (from the past 240 hour(s))  SARS CORONAVIRUS 2 (TAT 6-24 HRS) Nasopharyngeal Nasopharyngeal Swab     Status: None   Collection Time: 10/12/19  9:56 PM   Specimen: Nasopharyngeal Swab  Result Value Ref Range Status   SARS Coronavirus 2 NEGATIVE NEGATIVE Final    Comment: (NOTE) SARS-CoV-2 target nucleic acids are NOT DETECTED. The SARS-CoV-2 RNA is generally detectable in upper and lower respiratory specimens during the acute phase of infection. Negative results do not preclude SARS-CoV-2 infection, do not rule out co-infections with other pathogens, and should not be used as the sole basis for treatment or other patient management decisions. Negative results must be combined with clinical observations,  patient history, and epidemiological information. The expected result is Negative. Fact Sheet for Patients: SugarRoll.be Fact Sheet for Healthcare Providers: https://www.woods-mathews.com/ This test is not yet approved or cleared by the [place] FDA and  has been authorized for detection and/or diagnosis of SARS-CoV-2 by FDA under an Emergency Use Authorization (EUA). This EUA will remain  in effect (meaning this test can be used) for the duration of the COVID-19 declaration under Section 56 4(b)(1) of the Act, 21 U.S.C. section 360bbb-3(b)(1), unless the authorization is terminated or revoked sooner. Performed at [facility], [place] [address], [place], [place] [address]   Gram stain     Status: None   Collection Time: 10/13/19 10:12 AM   Specimen: Fluid  Result Value Ref Range Status   Specimen Description FLUID  Final   Special Requests NONE  Final   Gram Stain   Final    WBC PRESENT, PREDOMINANTLY MONONUCLEAR NO ORGANISMS SEEN CYTOSPIN SMEAR Performed at [facility], [place]  [address] [place], [place] [address]    Report Status 10/13/2019 FINAL  Final  Culture, body fluid-bottle     Status: None   Collection Time: 10/13/19 10:12 AM   Specimen: Fluid  Result Value Ref Range Status   Specimen Description FLUID  Final   Special Requests NONE  Final   Culture   Final    NO GROWTH 5 DAYS Performed at [facility], [address] [place], [place] [address]    Report Status 10/18/2019 FINAL  Final  SARS CORONAVIRUS 2 (TAT 6-24 HRS) Nasopharyngeal Nasopharyngeal Swab     Status: None   Collection Time: 10/17/19  8:06 AM   Specimen: Nasopharyngeal Swab  Result Value Ref Range Status   SARS Coronavirus 2 NEGATIVE NEGATIVE Final    Comment: (NOTE) SARS-CoV-2 target nucleic acids are NOT DETECTED. The SARS-CoV-2 RNA is generally detectable in upper and lower respiratory specimens during the acute phase of  infection. Negative results do not preclude SARS-CoV-2 infection, do not rule out co-infections with other pathogens, and should not be used as the sole basis for treatment or other patient management decisions. Negative results must be combined with clinical observations, patient history, and epidemiological information. The expected result is Negative. Fact Sheet for Patients: SugarRoll.be Fact Sheet for Healthcare Providers: https://www.woods-mathews.com/ This test is not yet approved or cleared by the [place] FDA and  has been authorized for detection and/or diagnosis of SARS-CoV-2 by FDA under an Emergency Use Authorization (EUA). This EUA will remain  in effect (meaning this test can be used) for the duration of the COVID-19 declaration under Section 56 4(b)(1) of the Act, 21 U.S.C. section 360bbb-3(b)(1), unless the authorization is terminated or revoked sooner. Performed at [facility], [place] [address] [place], [place] [address]       Radiology Studies: No results found.      Scheduled Meds: . feeding supplement (ENSURE ENLIVE)  237 mL Oral Daily  . fludrocortisone  0.2 mg Oral Daily  . furosemide  20 mg Oral Daily  . gemfibrozil  600 mg Oral q morning - 10a  . multivitamin with minerals  1 tablet Oral Daily  . pantoprazole  40 mg Oral Daily  . phosphorus  500 mg Oral TID  . sodium chloride flush  3 mL Intravenous Q12H  . spironolactone  25 mg Oral BID  . sucralfate  1 g Oral TID WC & HS  . vitamin B-12  1,000 mcg Oral Daily   Continuous Infusions:  Assessment & Plan:    1.  Anasarca in the setting of hypoalbuminemia, prior history of malignancies: Patient is s/p  paracentesis in this hospital course.  Albumin level at 2.3.  Currently saturating well on room air. Patient severely orthostatic today-changed IV lasix to oral and reduced aldactone dosage. Echo with normal EF. Dr. Sloan Leiter discussed with Dr. Benson Norway  on admission who recommended discharging on Aldactone and Lasix with potassium supplement and was going to schedule an appointment in 2 days in their office.  2.  Non alcoholic Liver cirrhosis: Transaminases only minimally elevated but ALP significantly elevated to 270-320/total bilirubin around 1.5 likely due to cirrhotic changes noted in the liver on CT scan done this admission.No h/o alcohol. Negative hepatitis profile. F/U GI as outpatient for further w/u and recommendations.  3. Abdominal pain/ GERD: Mild epigastric/mid abdominal tenderness secondary to gastritis versus recent paracentesis.  Continue PPI.  Added sucralfate and doing better but given patient's ongoing c/o abdominal pain, will touch  base with GI again. Ascitic fluid cultures so far negative   4.  CKD stage II: Patient's creatinine been stable around 0.8 to 1. Labs today however slightly up with creat at 1.2.  Monitor on reduced dose diuretics  5.  Severe Orthostasis: Patient on fludrocortisone at home for h/o orthostasis. Will change to midodrine to help with BP and to avoid salt retention with fludrocortisone in the setting of #1. Routine orthostatic check today revealed supine SBP 90, sitting SBP 70 and standing SBP 50s. Ordered support stockings, changed IV lasix to oral and reduced aldactone dose. D/C Florinef, add midodrine.  6. Non-Hodgkin's lymphoma: In remission.  S/p chemo.  CBC shows pancytopenia.  7. Colon cancer: S/p right colectomy.Post surgical changes noted on CT    8.Mild hypokalemia/hypophosphatemia: In the setting of diuretics.  Currently on K-Phos 3 times daily   9.  Pancytopenia: Discontinue Remeron as can worsen pancytopenia.  Avoid bone marrow suppressants.  She does have history of NHL and is s/p chemotherapy.  Could be related to cirrhosis/mild splenomegaly noted on CT as well.  10. Hyperlipidemia: Resumed home meds-gemfibrozil--?okay with cirrhosis  DVT prophylaxis: SCDs Code Status: DNR Family /  Patient Communication: d/w patient and bedside nurse/case management. Will update son  Disposition Plan: PT/OTrecommending SNF--has bed at [facility]   LOS: 6 days    Time spent: 35 minutes    Guilford Shi, MD [facility] Pager [phone]  If 7PM-7AM, please contact night-coverage [email or web address] Password [email or web address] 10/18/2019, 5:18 PM

## 2019-10-18 NOTE — Plan of Care (Signed)
  Problem: Health Behavior/Discharge Planning: Goal: Ability to manage health-related needs will improve Outcome: Progressing   Problem: Clinical Measurements: Goal: Ability to maintain clinical measurements within normal limits will improve Outcome: Progressing Goal: Will remain free from infection Outcome: Progressing Goal: Diagnostic test results will improve Outcome: Progressing Goal: Respiratory complications will improve Outcome: Progressing Goal: Cardiovascular complication will be avoided Outcome: Progressing   Problem: Nutrition: Goal: Adequate nutrition will be maintained Outcome: Progressing   Problem: Elimination: Goal: Will not experience complications related to bowel motility Outcome: Progressing Goal: Will not experience complications related to urinary retention Outcome: Progressing   Problem: Safety: Goal: Ability to remain free from injury will improve Outcome: Progressing   Problem: Skin Integrity: Goal: Risk for impaired skin integrity will decrease Outcome: Progressing

## 2019-10-19 DIAGNOSIS — I10 Essential (primary) hypertension: Secondary | ICD-10-CM | POA: Diagnosis not present

## 2019-10-19 DIAGNOSIS — N183 Chronic kidney disease, stage 3 unspecified: Secondary | ICD-10-CM | POA: Diagnosis not present

## 2019-10-19 DIAGNOSIS — K7460 Unspecified cirrhosis of liver: Secondary | ICD-10-CM | POA: Diagnosis not present

## 2019-10-19 DIAGNOSIS — Z79899 Other long term (current) drug therapy: Secondary | ICD-10-CM | POA: Diagnosis not present

## 2019-10-19 DIAGNOSIS — E785 Hyperlipidemia, unspecified: Secondary | ICD-10-CM | POA: Diagnosis not present

## 2019-10-19 DIAGNOSIS — J9601 Acute respiratory failure with hypoxia: Secondary | ICD-10-CM | POA: Diagnosis not present

## 2019-10-19 DIAGNOSIS — R279 Unspecified lack of coordination: Secondary | ICD-10-CM | POA: Diagnosis not present

## 2019-10-19 DIAGNOSIS — R188 Other ascites: Secondary | ICD-10-CM | POA: Diagnosis not present

## 2019-10-19 DIAGNOSIS — R21 Rash and other nonspecific skin eruption: Secondary | ICD-10-CM | POA: Diagnosis not present

## 2019-10-19 DIAGNOSIS — C182 Malignant neoplasm of ascending colon: Secondary | ICD-10-CM | POA: Diagnosis not present

## 2019-10-19 DIAGNOSIS — R112 Nausea with vomiting, unspecified: Secondary | ICD-10-CM | POA: Diagnosis not present

## 2019-10-19 DIAGNOSIS — T7840XD Allergy, unspecified, subsequent encounter: Secondary | ICD-10-CM | POA: Diagnosis not present

## 2019-10-19 DIAGNOSIS — N1831 Chronic kidney disease, stage 3a: Secondary | ICD-10-CM | POA: Diagnosis not present

## 2019-10-19 DIAGNOSIS — K5900 Constipation, unspecified: Secondary | ICD-10-CM | POA: Diagnosis not present

## 2019-10-19 DIAGNOSIS — R42 Dizziness and giddiness: Secondary | ICD-10-CM | POA: Diagnosis not present

## 2019-10-19 DIAGNOSIS — D649 Anemia, unspecified: Secondary | ICD-10-CM | POA: Diagnosis not present

## 2019-10-19 DIAGNOSIS — K219 Gastro-esophageal reflux disease without esophagitis: Secondary | ICD-10-CM | POA: Diagnosis not present

## 2019-10-19 DIAGNOSIS — E46 Unspecified protein-calorie malnutrition: Secondary | ICD-10-CM | POA: Diagnosis not present

## 2019-10-19 DIAGNOSIS — H04123 Dry eye syndrome of bilateral lacrimal glands: Secondary | ICD-10-CM | POA: Diagnosis not present

## 2019-10-19 DIAGNOSIS — Z743 Need for continuous supervision: Secondary | ICD-10-CM | POA: Diagnosis not present

## 2019-10-19 DIAGNOSIS — J90 Pleural effusion, not elsewhere classified: Secondary | ICD-10-CM | POA: Diagnosis not present

## 2019-10-19 DIAGNOSIS — J9600 Acute respiratory failure, unspecified whether with hypoxia or hypercapnia: Secondary | ICD-10-CM | POA: Diagnosis not present

## 2019-10-19 DIAGNOSIS — T8189XD Other complications of procedures, not elsewhere classified, subsequent encounter: Secondary | ICD-10-CM | POA: Diagnosis not present

## 2019-10-19 DIAGNOSIS — D61818 Other pancytopenia: Secondary | ICD-10-CM | POA: Diagnosis not present

## 2019-10-19 DIAGNOSIS — Z433 Encounter for attention to colostomy: Secondary | ICD-10-CM | POA: Diagnosis not present

## 2019-10-19 DIAGNOSIS — R52 Pain, unspecified: Secondary | ICD-10-CM | POA: Diagnosis not present

## 2019-10-19 DIAGNOSIS — Z9049 Acquired absence of other specified parts of digestive tract: Secondary | ICD-10-CM | POA: Diagnosis not present

## 2019-10-19 DIAGNOSIS — T8189XA Other complications of procedures, not elsewhere classified, initial encounter: Secondary | ICD-10-CM | POA: Diagnosis not present

## 2019-10-19 LAB — BASIC METABOLIC PANEL
Anion gap: 10 (ref 5–15)
BUN: 20 mg/dL (ref 8–23)
CO2: 28 mmol/L (ref 22–32)
Calcium: 7.7 mg/dL — ABNORMAL LOW (ref 8.9–10.3)
Chloride: 100 mmol/L (ref 98–111)
Creatinine, Ser: 1.07 mg/dL — ABNORMAL HIGH (ref 0.44–1.00)
GFR calc Af Amer: 56 mL/min — ABNORMAL LOW (ref >60)
GFR calc non Af Amer: 49 mL/min — ABNORMAL LOW (ref >60)
Glucose, Bld: 97 mg/dL (ref 70–99)
Potassium: 3.1 mmol/L — ABNORMAL LOW (ref 3.5–5.1)
Sodium: 138 mmol/L (ref 135–145)

## 2019-10-19 MED ORDER — POTASSIUM CHLORIDE 20 MEQ PO PACK: 20.0000 meq | PACK | Freq: Every day | ORAL | 0 refills | Status: DC

## 2019-10-19 MED ORDER — SPIRONOLACTONE 25 MG PO TABS: 25.0000 mg | ORAL_TABLET | Freq: Two times a day (BID) | ORAL | 1 refills | Status: DC

## 2019-10-19 MED ORDER — MIDODRINE HCL 2.5 MG PO TABS: 2.5000 mg | ORAL_TABLET | Freq: Two times a day (BID) | ORAL | 1 refills | Status: DC

## 2019-10-19 MED ORDER — SUCRALFATE 1 G PO TABS: 1.0000 g | ORAL_TABLET | Freq: Three times a day (TID) | ORAL | 0 refills | Status: DC

## 2019-10-19 MED ORDER — FUROSEMIDE 20 MG PO TABS: 20.0000 mg | ORAL_TABLET | Freq: Every day | ORAL | Status: DC

## 2019-10-19 MED ORDER — POTASSIUM CHLORIDE 20 MEQ PO PACK
40.0000 meq | PACK | Freq: Once | ORAL | Status: AC
  Administered 2019-10-19: 40 meq via ORAL
  Filled 2019-10-19: qty 2

## 2019-10-19 MED ORDER — POTASSIUM CHLORIDE 10 MEQ/100ML IV SOLN
10.0000 meq | INTRAVENOUS | Status: AC
  Administered 2019-10-19 (×2): 10 meq via INTRAVENOUS
  Filled 2019-10-19 (×2): qty 100

## 2019-10-19 MED ORDER — MIDODRINE HCL 2.5 MG PO TABS: 2.5000 mg | ORAL_TABLET | Freq: Two times a day (BID) | ORAL | 1 refills | Status: AC

## 2019-10-19 NOTE — TOC Progression Note (Signed)
Transition of Care [facility]) - Progression Note    Patient Details  Name: Erica Escobar MRN: [id number] Date of Birth: [birth year]  [facility]) CM/SW Contact  Alsha Meland, Juliann Pulse, RN Phone Number: 10/19/2019, 12:20 PM  Clinical Narrative:  Awaiting medical stablility-[facility] rep Levada Dy following-auth ends 11/6,covid neg 11/3. If no d/c tomorrow will need updates for auth,& covid.     Expected Discharge Plan: [place] Barriers to Discharge: Continued Medical Work up(has auth, and bed @ [place])  Expected Discharge Plan and Services Expected Discharge Plan: [place]   Discharge Planning Services: CM Consult Post Acute Care Choice: [place] Living arrangements for the past 2 months: Single Family Home                                       Social Determinants of Health (SDOH) Interventions    Readmission Risk Interventions No flowsheet data found.

## 2019-10-19 NOTE — TOC Transition Note (Signed)
Transition of Care [facility]) - CM/SW Discharge Note   Patient Details  Name: Erica Escobar MRN: [id number] Date of Birth: 10-15-1936  Transition of Care [facility]) CM/SW Contact:  Dessa Phi, RN Phone Number: 10/19/2019, 2:31 PM   Clinical Narrative: Faxed d/c summary to [place] will check & let me know if any additional info needed. Will await rm,tel# for nurse to call report. CM will contact [facility] for transport.       Final next level of care: Skilled Nursing Facility Barriers to Discharge: No Barriers Identified   Patient Goals and CMS Choice Patient states their goals for this hospitalization and ongoing recovery are:: Patient a+0x3 defers to son Carloyn Manner for decisions.Son Carloyn Manner in agreement to go to [employer].gov Compare Post Acute Care list provided to:: Patient Represenative (must comment)(Roy son) Choice offered to / list presented to : Adult Children  Discharge Placement              Patient chooses bed at: [place], [place] Patient to be transferred to facility by: [place] Name of family member notified: Alveda Reasons Patient and family notified of of transfer: 10/19/19  Discharge Plan and Services   Discharge Planning Services: CM Consult Post Acute Care Choice: [place]                               Social Determinants of Health (SDOH) Interventions     Readmission Risk Interventions No flowsheet data found.

## 2019-10-19 NOTE — TOC Progression Note (Signed)
Transition of Care [facility]) - Progression Note    Patient Details  Name: Erica Escobar MRN: [id number] Date of Birth: 06-29-1936  [facility]) CM/SW Contact  Aryssa Rosamond, Juliann Pulse, RN Phone Number: 10/19/2019, 2:59 PM  Clinical Narrative:  [facility] called for 5:15p pick up-forms @ [facility] notified. No further CM needs.     Expected Discharge Plan: [place] Barriers to Discharge: No Barriers Identified  Expected Discharge Plan and Services Expected Discharge Plan: [place]   Discharge Planning Services: CM Consult Post Acute Care Choice: [place] Living arrangements for the past 2 months: Single Family Home Expected Discharge Date: 10/19/19                                     Social Determinants of Health (SDOH) Interventions    Readmission Risk Interventions No flowsheet data found.

## 2019-10-19 NOTE — Progress Notes (Signed)
Report called to Joseph Art, patient's accepting RN at [facility].  CM arranged transportation with [facility] fro 5:15pm. Patient is currently receiving her last dose of IV potassium. Once finished, will d/c IV/tele and get patient ready for discharge.

## 2019-10-19 NOTE — Discharge Summary (Signed)
Physician Discharge Summary  Erica Escobar HFW:[id number] DOB: Feb 07, 1936 DOA: 10/12/2019  PCP: Deland Pretty, MD  Admit date: 10/12/2019 Discharge date: 10/19/2019 Consultations:  Admitted From:  Disposition:   Discharge Diagnoses:  Principal Problem:   Acute respiratory failure with hypoxia ([place]) Active Problems:   Cancer of ascending colon s/p robotic colectomy 02/15/2015   CKD (chronic kidney disease), stage III   Hyperlipidemia   Pleural effusion on right   Cirrhosis of liver with ascites ([place])   Pancytopenia [facility])   Hospital Summary: 83 year old female with history of non-Hodgkin's lymphomatreated with chemo on remission,colon cancer status post right colectomy, stage III chronic kidney disease, hyperlipidemia, anemia and GERD presentedwith c/odyspneax1 month.Patient hadseveral weeks of progressive lower extremity and abdominal swelling. She was seen by her PCP noted to be hypoxic with saturation 83%on room air and sent to ER.  ED course:Hemodynamically stable. Needed 2 L oxygen with saturation 100%. LFTs and renal function test normal with elevatedALPof 325,Bilirubin 1.6, albumin 2.3. WBC 2.8, platelets 109K. Ct abdomen showed changes consistent with cirrhosis of the liver with associated ascites and portal hypertension with evidence of recanalization of the umbilical vein-new since last exam. Moderate right pleural effusion,postsurgical changes consistent with right hemicolectomy with small bowel to colon surgery, sliding hiatal hernia, prominent spleen were also reported.  Hospital course:Essentially patient underwent paracentesis withremoval of 4.2L transudative fluid withimprovement  inoxygenation.She hasminimal pleural effusion. She was treated with IV diuretics(Lasix 20 mg IV BID, aldactone 100 mg oral daily)and then transitioned to oral diuretics. She has chronic orthostasis managed with florinef at home which worsened with diuretics prompting reducing to  current doses of diuretics. Currently oxygenating 97% on room air and improved orthostatic readings. Echocardiogram with normal ejection fraction. Patientplanned to be discharged on oral Lasix/Aldactone and to f/u GI clinic Dr Benson Norway forfurther work-up including autoimmune diseases.Patient not felt to be safe to discharge home due to unsteady gait and multiple falls and she lives alone.PT/OTrecommended SNF and has bed today.   1.  Anasarca in the setting of hypoalbuminemia, prior history of malignancies: Patient is now s/p paracentesis with no recurrence in this hospital course.  Albumin level at 2.3.  Currently saturating well on room air.  Patient severely orthostatic yesterday but improved and today, tolerating diuretics (Lasix 20 mg daily and Aldactone 25 mg twice daily) well. Echo with normal EF.Dr. Sloan Leiter discussed with Dr. Benson Norway on admission and I discussed with him again yesterday--recommended  follow-up in 1 week.  Patient advised to use support stockings at nursing facility, holding parameters for diuretic should be followed and potassium supplementation to be given with Lasix.  Patient's potassium level is at 3.1 today, will replace before discharge.  Repeat BMP at skilled nursing facility tomorrow.    2.  Non alcoholic Liver cirrhosis: Transaminases only minimally elevated but ALP significantly elevated to 270-320/total bilirubin around 1.5 likely due to cirrhotic changes noted in the liver on CT scan done this admission.No h/o alcohol. Negative hepatitis profile. F/U GI as outpatient for further w/u and recommendations.  3. Abdominal pain/ GERD: Mild epigastric/mid abdominal tenderness secondary to gastritis versus recent paracentesis.  Continue PPI.  Added sucralfate and doing better.  Discussed with GI, Dr. Benson Norway regarding CT findings of recanalized umbilical vein-should not cause pain per GI.  They agreed with continuing sucralfate for another week until seen by them for follow-up and  further advised.  Patient tolerating diet well now. Ascitic fluid cultures so far negative and no evidence of SBP.   4.  CKD stage II: Patient's creatinine  remained stable around 0.8 to 1 during the initial hospital course. Labs showed slight uptrend to creat at 1.2 while on IV diuretics but now again improved since transition to oral diuretics.  Continue to monitor with periodic labs at skilled nursing facility.  5.  Severe Orthostasis: Patient on fludrocortisone at home for h/o orthostasis.  Midodrine is probably a better choice to help with BP and to avoid salt retention with fludrocortisone in the setting of #1. Routine orthostatic check 11/4 revealed supine SBP 90, sitting SBP 70 and standing SBP 50s. Ordered support stockings, changed IV lasix to oral and reduced aldactone dose, discontinued Florinef and started midodrine.  With these interventions patient appears to be much improved today with supine blood pressure 111/78, sitting 104/74, standing 90/77 and patient remained asymptomatic.  She should continue using support stockings at nursing facility.  6. Non-Hodgkin's lymphoma: In remission.  S/p chemo.  CBC shows pancytopenia.  7. Colon cancer: S/p right colectomy.Post surgical changes noted on CT    8.Mild hypokalemia/hypophosphatemia: In the setting of diuretics.  Currently on K-Phos 3 times daily   9.  Pancytopenia: Discontinued Remeron that she was taking at home as this medication can worsen pancytopenia.  Avoid bone marrow suppressants.  She does have history of NHL and is s/p chemotherapy.  Could be related to cirrhosis/mild splenomegaly noted on CT as well.  I am not sure of the indication for aspirin that she takes at home--this was held on admission in concern for thrombocytopenia/anemia.  Patient to follow-up PCP and resume if indicated otherwise.  10. Hyperlipidemia: Resumed home meds-okay to continue gemfibrozil per my discussion with GI   Discharge Exam:  Vitals:    10/19/19 0903 10/19/19 1243  BP:  125/74  Pulse:  73  Resp:  16  Temp:  99.3 F (37.4 C)  SpO2: 95% 97%   Vitals:   10/19/19 0856 10/19/19 0900 10/19/19 0903 10/19/19 1243  BP:    125/74  Pulse:    73  Resp:    16  Temp:    99.3 F (37.4 C)  TempSrc:    Oral  SpO2: 95% 98% 95% 97%  Weight:      Height:        General: Pt is alert, awake, not in acute distress Cardiovascular: RRR, S1/S2 +, no rubs, no gallops Respiratory: CTA bilaterally, no wheezing, no rhonchi Abdominal: Soft, NT, ND, bowel sounds + Extremities: no edema, no cyanosis  Discharge Condition:Stable CODE STATUS: DNR Diet recommendation: 2 g sodium diet Recommendations for Outpatient Follow-up:  1. Follow up with PCP: At skilled nursing facility and primary PCP upon discharge from SNF 2. Follow up with consultants: GI Dr. Sharee Holster in 1 week 3. Please obtain follow up labs including: BMP in a.m. and CMP in 1 week, CBC in 3 days  Equipment/Devices upon discharge: Support stockings   Discharge Instructions:  Discharge Instructions    (HEART FAILURE PATIENTS) Call MD:  Anytime you have any of the following symptoms: 1) 3 pound weight gain in 24 hours or 5 pounds in 1 week 2) shortness of breath, with or without a dry hacking cough 3) swelling in the hands, feet or stomach 4) if you have to sleep on extra pillows at night in order to breathe.   Complete by: As directed    (HEART FAILURE PATIENTS) Call MD:  Anytime you have any of the following symptoms: 1) 3 pound weight gain in 24 hours or  5 pounds in 1 week 2) shortness of breath, with or without a dry hacking cough 3) swelling in the hands, feet or stomach 4) if you have to sleep on extra pillows at night in order to breathe.   Complete by: As directed    Call MD for:  difficulty breathing, headache or visual disturbances   Complete by: As directed    Call MD for:  difficulty breathing, headache or visual disturbances   Complete by: As directed    Call MD  for:  persistant dizziness or light-headedness   Complete by: As directed    Call MD for:  persistant dizziness or light-headedness   Complete by: As directed    Call MD for:  persistant nausea and vomiting   Complete by: As directed    Call MD for:  severe uncontrolled pain   Complete by: As directed    Call MD for:  severe uncontrolled pain   Complete by: As directed    Call MD for:  temperature >100.4   Complete by: As directed    Call MD for:  temperature >100.4   Complete by: As directed    Diet - low sodium heart healthy   Complete by: As directed    Increase activity slowly   Complete by: As directed    Increase activity slowly   Complete by: As directed      Allergies as of 10/19/2019      Reactions   Azithromycin Itching   Allopurinol Other (See Comments)   Unknown   Cephalexin Other (See Comments)   Unknown   Codeine Itching   Gabapentin Swelling, Other (See Comments)   Throat and leg swelling   Niaspan [niacin] Other (See Comments)   Unknown   Oxybutynin Other (See Comments)   "made me go crazy"-- had crazy dreams.    Paxil [paroxetine Hcl] Other (See Comments)   Unknown reaction   Penicillins Itching, Other (See Comments)   Has patient had a PCN reaction causing immediate rash, facial/tongue/throat swelling, SOB or lightheadedness with hypotension: No Has patient had a PCN reaction causing severe rash involving mucus membranes or skin necrosis: Yes Has patient had a PCN reaction that required hospitalization: No Has patient had a PCN reaction occurring within the last 10 years: No If all of the above answers are "NO", then may proceed with Cephalosporin use.   Pentazocine Itching, Nausea Only, Other (See Comments)   Headache.    Tizanidine Other (See Comments)   "made me crazy"   Tramadol Other (See Comments)   Head feels "swimmy" and weakness   Vicodin [hydrocodone-acetaminophen] Other (See Comments)   "made me go crazy"      Medication List    STOP  taking these medications   aspirin EC 81 MG tablet   fludrocortisone 0.1 MG tablet Commonly known as: FLORINEF   loperamide 2 MG capsule Commonly known as: IMODIUM   mirtazapine 7.5 MG tablet Commonly known as: REMERON   multivitamin-lutein Caps capsule   promethazine 25 MG tablet Commonly known as: PHENERGAN   TYLENOL 500 MG tablet Generic drug: acetaminophen     TAKE these medications   betamethasone valerate ointment 0.1 % Commonly known as: VALISONE Apply 1 application topically 2 (two) times daily as needed (Heat under breast).   calcium-vitamin D 250-125 MG-UNIT tablet Commonly known as: OSCAL WITH D Take 1 tablet by mouth daily.   Ensure Take 237 mLs by mouth daily. VANILLA OR STRAWBERRY IF AVAILABLE PO DAILY  Fish Oil 1000 MG Caps Take 2,000 mg by mouth daily with breakfast.   furosemide 20 MG tablet Commonly known as: LASIX Take 1 tablet (20 mg total) by mouth daily. Hold for supine SBP <110 . Hold potassium if holding Lasix Start taking on: October 20, 2019   gemfibrozil 600 MG tablet Commonly known as: LOPID Take 600 mg by mouth every morning.   loratadine 10 MG tablet Commonly known as: CLARITIN Take 10 mg by mouth daily as needed for allergies.   midodrine 2.5 MG tablet Commonly known as: PROAMATINE Take 1 tablet (2.5 mg total) by mouth 2 (two) times daily with a meal.   multivitamin with minerals Tabs tablet Take 1 tablet by mouth daily.   ondansetron 4 MG tablet Commonly known as: Zofran Take 1 tablet (4 mg total) by mouth every 8 (eight) hours as needed for nausea or vomiting.   pantoprazole 40 MG tablet Commonly known as: PROTONIX Take 40 mg by mouth daily.   polyethylene glycol 17 g packet Commonly known as: MIRALAX / GLYCOLAX Take 17 g by mouth daily as needed for moderate constipation.   potassium chloride 20 MEQ packet Commonly known as: KLOR-CON Take 20 mEq by mouth daily. With lasix   Refresh 1.4-0.6 % Soln Generic drug:  Polyvinyl Alcohol-Povidone PF Place 1 drop into both eyes 3 (three) times daily as needed (dry eyes).   spironolactone 25 MG tablet Commonly known as: ALDACTONE Take 1 tablet (25 mg total) by mouth 2 (two) times daily. Hold supine SBP <100   sucralfate 1 g tablet Commonly known as: CARAFATE Take 1 tablet (1 g total) by mouth 4 (four) times daily -  with meals and at bedtime for 7 days.   vitamin B-12 1000 MCG tablet Commonly known as: CYANOCOBALAMIN Take 1,000 mcg by mouth daily.   vitamin C 500 MG tablet Commonly known as: ASCORBIC ACID Take 500 mg by mouth daily.      Contact information for after-discharge care    Destination    [facility] Preferred SNF .   Service: Skilled Nursing Contact information: [place] [phone]             Allergies  Allergen Reactions  . Azithromycin Itching  . Allopurinol Other (See Comments)    Unknown  . Cephalexin Other (See Comments)    Unknown  . Codeine Itching  . Gabapentin Swelling and Other (See Comments)    Throat and leg swelling  . Niaspan [Niacin] Other (See Comments)    Unknown  . Oxybutynin Other (See Comments)    "made me go crazy"-- had crazy dreams.   . Paxil [Paroxetine Hcl] Other (See Comments)    Unknown reaction  . Penicillins Itching and Other (See Comments)    Has patient had a PCN reaction causing immediate rash, facial/tongue/throat swelling, SOB or lightheadedness with hypotension: No Has patient had a PCN reaction causing severe rash involving mucus membranes or skin necrosis: Yes Has patient had a PCN reaction that required hospitalization: No Has patient had a PCN reaction occurring within the last 10 years: No If all of the above answers are "NO", then may proceed with Cephalosporin use.   Marland Kitchen Pentazocine Itching, Nausea Only and Other (See Comments)    Headache.   . Tizanidine Other (See Comments)    "made me crazy"  .  Tramadol Other (See Comments)    Head feels "swimmy" and weakness  . Vicodin [Hydrocodone-Acetaminophen] Other (See Comments)    "  made me go crazy"      The results of significant diagnostics from this hospitalization (including imaging, microbiology, ancillary and laboratory) are listed below for reference.    Labs: BNP (last 3 results) Recent Labs    09/11/19 1631 10/12/19 1852  BNP 73.2 81.0   Basic Metabolic Panel: Recent Labs  Lab 10/12/19 2050  10/14/19 0527 10/15/19 0520 10/17/19 0448 10/18/19 0436 10/19/19 0512  NA  --    < > 141 142 137 139 138  K  --    < > 3.1* 3.4* 4.2 3.8 3.1*  CL  --    < > 107 103 100 98 100  CO2  --    < > 25 28 29  32 28  GLUCOSE  --    < > 96 90 91 100* 97  BUN  --    < > 14 17 18 19 20   CREATININE  --    < > 0.87 1.00 1.19* 1.20* 1.07*  CALCIUM  --    < > 7.7* 7.8* 7.7* 7.7* 7.7*  MG 1.7  --  1.5* 1.6*  --  1.6*  --   PHOS  --   --  2.3*  --  3.8 4.5  --    < > = values in this interval not displayed.   Liver Function Tests: Recent Labs  Lab 10/12/19 1850 10/13/19 0453 10/14/19 0527  AST 48* 42* 41  ALT 29 25 24   ALKPHOS 325* 271* 271*  BILITOT 1.6* 1.3* 1.4*  PROT 4.9* 4.3* 4.3*  ALBUMIN 2.7* 2.3* 2.3*   Recent Labs  Lab 10/12/19 1850  LIPASE 54*   No results for input(s): AMMONIA in the last 168 hours. CBC: Recent Labs  Lab 10/12/19 1850 10/13/19 0453 10/14/19 0527 10/15/19 0520 10/17/19 0448  WBC 2.8* 2.3* 2.6* 2.6* 2.3*  NEUTROABS 1.8  --  1.5* 1.4*  --   HGB 11.2* 9.7* 10.4* 10.8* 10.5*  HCT 33.3* 27.9* 30.9* 31.4* 31.0*  MCV 98.8 98.2 99.7 98.7 100.3*  PLT 109* 82* 92* 90* 86*   Cardiac Enzymes: No results for input(s): CKTOTAL, CKMB, CKMBINDEX, TROPONINI in the last 168 hours. BNP: Invalid input(s): POCBNP CBG: No results for input(s): GLUCAP in the last 168 hours. D-Dimer No results for input(s): DDIMER in the last 72 hours. Hgb A1c No results for input(s): HGBA1C in the last 72 hours. Lipid  Profile No results for input(s): CHOL, HDL, LDLCALC, TRIG, CHOLHDL, LDLDIRECT in the last 72 hours. Thyroid function studies No results for input(s): TSH, T4TOTAL, T3FREE, THYROIDAB in the last 72 hours.  Invalid input(s): FREET3 Anemia work up No results for input(s): VITAMINB12, FOLATE, FERRITIN, TIBC, IRON, RETICCTPCT in the last 72 hours. Urinalysis    Component Value Date/Time   COLORURINE YELLOW 08/28/2018 0753   APPEARANCEUR HAZY (A) 08/28/2018 0753   LABSPEC 1.016 08/28/2018 0753   PHURINE 5.0 08/28/2018 0753   GLUCOSEU NEGATIVE 08/28/2018 0753   HGBUR NEGATIVE 08/28/2018 0753   BILIRUBINUR NEGATIVE 08/28/2018 0753   KETONESUR NEGATIVE 08/28/2018 0753   PROTEINUR NEGATIVE 08/28/2018 0753   UROBILINOGEN 0.2 03/10/2009 1608   NITRITE NEGATIVE 08/28/2018 0753   LEUKOCYTESUR NEGATIVE 08/28/2018 0753   Sepsis Labs Invalid input(s): PROCALCITONIN,  WBC,  LACTICIDVEN Microbiology Recent Results (from the past 240 hour(s))  SARS CORONAVIRUS 2 (TAT 6-24 HRS) Nasopharyngeal Nasopharyngeal Swab     Status: None   Collection Time: 10/12/19  9:56 PM   Specimen: Nasopharyngeal Swab  Result Value Ref Range Status  SARS Coronavirus 2 NEGATIVE NEGATIVE Final    Comment: (NOTE) SARS-CoV-2 target nucleic acids are NOT DETECTED. The SARS-CoV-2 RNA is generally detectable in upper and lower respiratory specimens during the acute phase of infection. Negative results do not preclude SARS-CoV-2 infection, do not rule out co-infections with other pathogens, and should not be used as the sole basis for treatment or other patient management decisions. Negative results must be combined with clinical observations, patient history, and epidemiological information. The expected result is Negative. Fact Sheet for Patients: SugarRoll.be Fact Sheet for Healthcare Providers: https://www.woods-mathews.com/ This test is not yet approved or cleared by the  [place] FDA and  has been authorized for detection and/or diagnosis of SARS-CoV-2 by FDA under an Emergency Use Authorization (EUA). This EUA will remain  in effect (meaning this test can be used) for the duration of the COVID-19 declaration under Section 56 4(b)(1) of the Act, 21 U.S.C. section 360bbb-3(b)(1), unless the authorization is terminated or revoked sooner. Performed at [facility], [place] [address] [place], [place] [address]   Gram stain     Status: None   Collection Time: 10/13/19 10:12 AM   Specimen: Fluid  Result Value Ref Range Status   Specimen Description FLUID  Final   Special Requests NONE  Final   Gram Stain   Final    WBC PRESENT, PREDOMINANTLY MONONUCLEAR NO ORGANISMS SEEN CYTOSPIN SMEAR Performed at [facility], [place] [address], [place], [place] [address]    Report Status 10/13/2019 FINAL  Final  Culture, body fluid-bottle     Status: None   Collection Time: 10/13/19 10:12 AM   Specimen: Fluid  Result Value Ref Range Status   Specimen Description FLUID  Final   Special Requests NONE  Final   Culture   Final    NO GROWTH 5 DAYS Performed at [place] [address] [place], [place] [address]    Report Status 10/18/2019 FINAL  Final  SARS CORONAVIRUS 2 (TAT 6-24 HRS) Nasopharyngeal Nasopharyngeal Swab     Status: None   Collection Time: 10/17/19  8:06 AM   Specimen: Nasopharyngeal Swab  Result Value Ref Range Status   SARS Coronavirus 2 NEGATIVE NEGATIVE Final    Comment: (NOTE) SARS-CoV-2 target nucleic acids are NOT DETECTED. The SARS-CoV-2 RNA is generally detectable in upper and lower respiratory specimens during the acute phase of infection. Negative results do not preclude SARS-CoV-2 infection, do not rule out co-infections with other pathogens, and should not be used as the sole basis for treatment or other patient management decisions. Negative results must be combined with clinical observations, patient  history, and epidemiological information. The expected result is Negative. Fact Sheet for Patients: SugarRoll.be Fact Sheet for Healthcare Providers: https://www.woods-mathews.com/ This test is not yet approved or cleared by the [place] FDA and  has been authorized for detection and/or diagnosis of SARS-CoV-2 by FDA under an Emergency Use Authorization (EUA). This EUA will remain  in effect (meaning this test can be used) for the duration of the COVID-19 declaration under Section 56 4(b)(1) of the Act, 21 U.S.C. section 360bbb-3(b)(1), unless the authorization is terminated or revoked sooner. Performed at [facility], [place] [address], [place], [place] [address]     Procedures/Studies: Dg Chest 2 View  Result Date: 10/12/2019 CLINICAL DATA:  Shortness of breath, exertional dyspnea for 1-2 weeks, abdominal distension lower extremity edema EXAM: CHEST - 2 VIEW COMPARISON:  Radiograph 09/11/2019, CT 12/23/2018 FINDINGS: Small to  moderate right pleural effusion, similar to slightly increased in size from comparison images. Adjacent atelectasis. No convincing features of interstitial or alveolar edema. No consolidative process. Cardiomediastinal contours are similar to comparison exam. Degenerative changes are present in the imaged spine and shoulders. No acute osseous or soft tissue abnormality. IMPRESSION: Small to moderate right pleural effusion, similar to minimally increased in size from comparison images. No convincing features of interstitial or alveolar edema. Electronically Signed   By: Lovena Le M.D.   On: 10/12/2019 19:23   Ct Abdomen Pelvis W Contrast  Result Date: 10/12/2019 CLINICAL DATA:  Abdominal distension EXAM: CT ABDOMEN AND PELVIS WITH CONTRAST TECHNIQUE: Multidetector CT imaging of the abdomen and pelvis was performed using the standard protocol following bolus administration of intravenous contrast. CONTRAST:  111m  OMNIPAQUE IOHEXOL 300 MG/ML  SOLN COMPARISON:  12/23/2018, 08/29/2018 FINDINGS: Lower chest: Moderate right-sided pleural effusion is noted with underlying atelectatic changes. The left lung base is clear. Hepatobiliary: Considerable ascites is noted surrounding the liver. Changes consistent with hepatic cirrhosis are noted with recanalization of the umbilical vein. This is new from the prior exam. The fluid in the abdomen may contribute to the right-sided effusion. The gallbladder has been surgically removed. Pancreas: Unremarkable. No pancreatic ductal dilatation or surrounding inflammatory changes. Spleen: Spleen is mildly prominent consistent with the underlying cirrhosis. Adrenals/Urinary Tract: Adrenal glands are within normal limits. The left kidney shows a normal enhancement pattern. The right kidney also shows a normal enhancement pattern. Excretion is noted bilaterally. No obstructive changes are seen. The bladder is decompressed. Stomach/Bowel: Diverticular change of the colon is noted without evidence of diverticulitis. No obstructive or inflammatory changes of the colon are seen. Postsurgical changes are noted consistent with right hemicolectomy with small bowel to colon surgery. The overall appearance is similar to that seen on prior exam. Small bowel is within normal limits. The stomach shows a sliding-type hiatal hernia. Vascular/Lymphatic: Aortic atherosclerosis. No enlarged abdominal or pelvic lymph nodes. Reproductive: Status post hysterectomy. No adnexal masses. Other: Considerable ascites is identified. Mild extension of the fluid into the inguinal canals is noted bilaterally. Mild changes of anasarca are noted. Musculoskeletal: Degenerative changes of the lumbar spine are noted. No acute abnormality is seen. IMPRESSION: Changes consistent with cirrhosis of the liver with associated ascites and portal hypertension with evidence of recanalization of the umbilical vein. These changes have  increased in the interval from the prior exam. Right-sided pleural effusion which may be related to the underlying ascites. Diverticulosis without diverticulitis. Electronically Signed   By: MInez CatalinaM.D.   On: 10/12/2019 21:40   [place]acentesis  Result Date: 10/13/2019 INDICATION: Patient with prior history of non-Hodgkin's lymphoma, colon cancer with prior right colectomy, chronic kidney disease, cirrhosis by imaging, ascites; request received for diagnostic and therapeutic paracentesis. EXAM: ULTRASOUND GUIDED DIAGNOSTIC AND THERAPEUTIC PARACENTESIS MEDICATIONS: None COMPLICATIONS: None immediate. PROCEDURE: Informed written consent was obtained from the patient after a discussion of the risks, benefits and alternatives to treatment. A timeout was performed prior to the initiation of the procedure. Initial ultrasound scanning demonstrates a moderate to large amount of ascites within the right lower abdominal quadrant. The right lower abdomen was prepped and draped in the usual sterile fashion. 1% lidocaine was used for local anesthesia. Following this, a 19 gauge, 10-cm, Yueh catheter was introduced. An ultrasound image was saved for documentation purposes. The paracentesis was performed. The catheter was removed and a dressing was applied. The patient tolerated the procedure well without immediate post  procedural complication. FINDINGS: A total of approximately 4.1 liters of slightly hazy, yellow fluid was removed. Samples were sent to the laboratory as requested by the clinical team. IMPRESSION: Successful ultrasound-guided diagnostic and therapeutic paracentesis yielding 4.1 liters of peritoneal fluid. Read by: Rowe Robert, PA-C Electronically Signed   By: Aletta Edouard M.D.   On: 10/13/2019 12:33    Time coordinating discharge: Over 30 minutes  SIGNED:   Guilford Shi, MD  [facility] 10/19/2019, 1:29 PM Pager : [phone]

## 2019-10-19 NOTE — TOC Progression Note (Signed)
Transition of Care [facility]) - Progression Note    Patient Details  Name: Erica Escobar MRN: [id number] Date of Birth: 06-20-1936  [facility] of [facility]) CM/SW Contact  Khayla Koppenhaver, Juliann Pulse, RN Phone Number: 10/19/2019, 2:38 PM  Clinical Narrative:Patient going to [place] to call report tel#[phone]. Will arrange PTAR for pick up based on Nsg.       Expected Discharge Plan: [place] Barriers to Discharge: No Barriers Identified  Expected Discharge Plan and Services Expected Discharge Plan: [place]   Discharge Planning Services: CM Consult Post Acute Care Choice: [place] Living arrangements for the past 2 months: Single Family Home Expected Discharge Date: 10/19/19                                     Social Determinants of Health (SDOH) Interventions    Readmission Risk Interventions No flowsheet data found.

## 2019-10-21 DIAGNOSIS — E46 Unspecified protein-calorie malnutrition: Secondary | ICD-10-CM | POA: Diagnosis not present

## 2019-10-21 DIAGNOSIS — N183 Chronic kidney disease, stage 3 unspecified: Secondary | ICD-10-CM | POA: Diagnosis not present

## 2019-10-21 DIAGNOSIS — R188 Other ascites: Secondary | ICD-10-CM | POA: Diagnosis not present

## 2019-10-21 DIAGNOSIS — J9601 Acute respiratory failure with hypoxia: Secondary | ICD-10-CM | POA: Diagnosis not present

## 2019-10-21 DIAGNOSIS — K7460 Unspecified cirrhosis of liver: Secondary | ICD-10-CM | POA: Diagnosis not present

## 2019-10-24 ENCOUNTER — Telehealth: Payer: Self-pay | Admitting: Nurse Practitioner

## 2019-10-24 NOTE — Telephone Encounter (Signed)
Called pt per 11/10 sch message  - no answer - no vmail - and no longer at facility .   - mailed letter with new appt date and time

## 2019-10-26 DIAGNOSIS — T8189XA Other complications of procedures, not elsewhere classified, initial encounter: Secondary | ICD-10-CM | POA: Diagnosis not present

## 2019-11-02 DIAGNOSIS — T8189XD Other complications of procedures, not elsewhere classified, subsequent encounter: Secondary | ICD-10-CM | POA: Diagnosis not present

## 2019-11-04 DIAGNOSIS — C44612 Basal cell carcinoma of skin of right upper limb, including shoulder: Secondary | ICD-10-CM | POA: Diagnosis not present

## 2019-11-04 DIAGNOSIS — M1990 Unspecified osteoarthritis, unspecified site: Secondary | ICD-10-CM | POA: Diagnosis not present

## 2019-11-04 DIAGNOSIS — E440 Moderate protein-calorie malnutrition: Secondary | ICD-10-CM | POA: Diagnosis not present

## 2019-11-04 DIAGNOSIS — M549 Dorsalgia, unspecified: Secondary | ICD-10-CM | POA: Diagnosis not present

## 2019-11-04 DIAGNOSIS — R188 Other ascites: Secondary | ICD-10-CM | POA: Diagnosis not present

## 2019-11-04 DIAGNOSIS — J9601 Acute respiratory failure with hypoxia: Secondary | ICD-10-CM | POA: Diagnosis not present

## 2019-11-04 DIAGNOSIS — K7460 Unspecified cirrhosis of liver: Secondary | ICD-10-CM | POA: Diagnosis not present

## 2019-11-04 DIAGNOSIS — N183 Chronic kidney disease, stage 3 unspecified: Secondary | ICD-10-CM | POA: Diagnosis not present

## 2019-11-04 DIAGNOSIS — Z85038 Personal history of other malignant neoplasm of large intestine: Secondary | ICD-10-CM | POA: Diagnosis not present

## 2019-11-04 DIAGNOSIS — D61818 Other pancytopenia: Secondary | ICD-10-CM | POA: Diagnosis not present

## 2019-11-04 DIAGNOSIS — E785 Hyperlipidemia, unspecified: Secondary | ICD-10-CM | POA: Diagnosis not present

## 2019-11-04 DIAGNOSIS — R1314 Dysphagia, pharyngoesophageal phase: Secondary | ICD-10-CM | POA: Diagnosis not present

## 2019-11-04 DIAGNOSIS — I129 Hypertensive chronic kidney disease with stage 1 through stage 4 chronic kidney disease, or unspecified chronic kidney disease: Secondary | ICD-10-CM | POA: Diagnosis not present

## 2019-11-04 DIAGNOSIS — Z8572 Personal history of non-Hodgkin lymphomas: Secondary | ICD-10-CM | POA: Diagnosis not present

## 2019-11-04 DIAGNOSIS — Z483 Aftercare following surgery for neoplasm: Secondary | ICD-10-CM | POA: Diagnosis not present

## 2019-11-04 DIAGNOSIS — H9193 Unspecified hearing loss, bilateral: Secondary | ICD-10-CM | POA: Diagnosis not present

## 2019-11-04 DIAGNOSIS — Z433 Encounter for attention to colostomy: Secondary | ICD-10-CM | POA: Diagnosis not present

## 2019-11-04 DIAGNOSIS — G8929 Other chronic pain: Secondary | ICD-10-CM | POA: Diagnosis not present

## 2019-11-04 DIAGNOSIS — Z4801 Encounter for change or removal of surgical wound dressing: Secondary | ICD-10-CM | POA: Diagnosis not present

## 2019-11-04 DIAGNOSIS — J90 Pleural effusion, not elsewhere classified: Secondary | ICD-10-CM | POA: Diagnosis not present

## 2019-11-05 DIAGNOSIS — Z483 Aftercare following surgery for neoplasm: Secondary | ICD-10-CM | POA: Diagnosis not present

## 2019-11-05 DIAGNOSIS — G8929 Other chronic pain: Secondary | ICD-10-CM | POA: Diagnosis not present

## 2019-11-05 DIAGNOSIS — M549 Dorsalgia, unspecified: Secondary | ICD-10-CM | POA: Diagnosis not present

## 2019-11-05 DIAGNOSIS — E785 Hyperlipidemia, unspecified: Secondary | ICD-10-CM | POA: Diagnosis not present

## 2019-11-05 DIAGNOSIS — R1314 Dysphagia, pharyngoesophageal phase: Secondary | ICD-10-CM | POA: Diagnosis not present

## 2019-11-05 DIAGNOSIS — Z433 Encounter for attention to colostomy: Secondary | ICD-10-CM | POA: Diagnosis not present

## 2019-11-05 DIAGNOSIS — N183 Chronic kidney disease, stage 3 unspecified: Secondary | ICD-10-CM | POA: Diagnosis not present

## 2019-11-05 DIAGNOSIS — Z8572 Personal history of non-Hodgkin lymphomas: Secondary | ICD-10-CM | POA: Diagnosis not present

## 2019-11-05 DIAGNOSIS — C44612 Basal cell carcinoma of skin of right upper limb, including shoulder: Secondary | ICD-10-CM | POA: Diagnosis not present

## 2019-11-05 DIAGNOSIS — M1990 Unspecified osteoarthritis, unspecified site: Secondary | ICD-10-CM | POA: Diagnosis not present

## 2019-11-05 DIAGNOSIS — J9601 Acute respiratory failure with hypoxia: Secondary | ICD-10-CM | POA: Diagnosis not present

## 2019-11-05 DIAGNOSIS — Z4801 Encounter for change or removal of surgical wound dressing: Secondary | ICD-10-CM | POA: Diagnosis not present

## 2019-11-05 DIAGNOSIS — J90 Pleural effusion, not elsewhere classified: Secondary | ICD-10-CM | POA: Diagnosis not present

## 2019-11-05 DIAGNOSIS — E440 Moderate protein-calorie malnutrition: Secondary | ICD-10-CM | POA: Diagnosis not present

## 2019-11-05 DIAGNOSIS — I129 Hypertensive chronic kidney disease with stage 1 through stage 4 chronic kidney disease, or unspecified chronic kidney disease: Secondary | ICD-10-CM | POA: Diagnosis not present

## 2019-11-05 DIAGNOSIS — D61818 Other pancytopenia: Secondary | ICD-10-CM | POA: Diagnosis not present

## 2019-11-05 DIAGNOSIS — R188 Other ascites: Secondary | ICD-10-CM | POA: Diagnosis not present

## 2019-11-05 DIAGNOSIS — K7460 Unspecified cirrhosis of liver: Secondary | ICD-10-CM | POA: Diagnosis not present

## 2019-11-05 DIAGNOSIS — Z85038 Personal history of other malignant neoplasm of large intestine: Secondary | ICD-10-CM | POA: Diagnosis not present

## 2019-11-05 DIAGNOSIS — H9193 Unspecified hearing loss, bilateral: Secondary | ICD-10-CM | POA: Diagnosis not present

## 2019-11-07 ENCOUNTER — Telehealth: Payer: Self-pay | Admitting: Nurse Practitioner

## 2019-11-07 DIAGNOSIS — H9193 Unspecified hearing loss, bilateral: Secondary | ICD-10-CM | POA: Diagnosis not present

## 2019-11-07 DIAGNOSIS — N183 Chronic kidney disease, stage 3 unspecified: Secondary | ICD-10-CM | POA: Diagnosis not present

## 2019-11-07 DIAGNOSIS — J9601 Acute respiratory failure with hypoxia: Secondary | ICD-10-CM | POA: Diagnosis not present

## 2019-11-07 DIAGNOSIS — Z85038 Personal history of other malignant neoplasm of large intestine: Secondary | ICD-10-CM | POA: Diagnosis not present

## 2019-11-07 DIAGNOSIS — Z433 Encounter for attention to colostomy: Secondary | ICD-10-CM | POA: Diagnosis not present

## 2019-11-07 DIAGNOSIS — C44612 Basal cell carcinoma of skin of right upper limb, including shoulder: Secondary | ICD-10-CM | POA: Diagnosis not present

## 2019-11-07 DIAGNOSIS — D61818 Other pancytopenia: Secondary | ICD-10-CM | POA: Diagnosis not present

## 2019-11-07 DIAGNOSIS — I129 Hypertensive chronic kidney disease with stage 1 through stage 4 chronic kidney disease, or unspecified chronic kidney disease: Secondary | ICD-10-CM | POA: Diagnosis not present

## 2019-11-07 DIAGNOSIS — Z4801 Encounter for change or removal of surgical wound dressing: Secondary | ICD-10-CM | POA: Diagnosis not present

## 2019-11-07 DIAGNOSIS — J90 Pleural effusion, not elsewhere classified: Secondary | ICD-10-CM | POA: Diagnosis not present

## 2019-11-07 DIAGNOSIS — K7460 Unspecified cirrhosis of liver: Secondary | ICD-10-CM | POA: Diagnosis not present

## 2019-11-07 DIAGNOSIS — R1314 Dysphagia, pharyngoesophageal phase: Secondary | ICD-10-CM | POA: Diagnosis not present

## 2019-11-07 DIAGNOSIS — E440 Moderate protein-calorie malnutrition: Secondary | ICD-10-CM | POA: Diagnosis not present

## 2019-11-07 DIAGNOSIS — Z8572 Personal history of non-Hodgkin lymphomas: Secondary | ICD-10-CM | POA: Diagnosis not present

## 2019-11-07 DIAGNOSIS — G8929 Other chronic pain: Secondary | ICD-10-CM | POA: Diagnosis not present

## 2019-11-07 DIAGNOSIS — M1990 Unspecified osteoarthritis, unspecified site: Secondary | ICD-10-CM | POA: Diagnosis not present

## 2019-11-07 DIAGNOSIS — M549 Dorsalgia, unspecified: Secondary | ICD-10-CM | POA: Diagnosis not present

## 2019-11-07 DIAGNOSIS — E785 Hyperlipidemia, unspecified: Secondary | ICD-10-CM | POA: Diagnosis not present

## 2019-11-07 DIAGNOSIS — Z483 Aftercare following surgery for neoplasm: Secondary | ICD-10-CM | POA: Diagnosis not present

## 2019-11-07 DIAGNOSIS — R188 Other ascites: Secondary | ICD-10-CM | POA: Diagnosis not present

## 2019-11-07 NOTE — Telephone Encounter (Signed)
Returned patient's phone call regarding cancelling 11/27 appointment, per patient's request appointment is cancelled.

## 2019-11-08 DIAGNOSIS — G8929 Other chronic pain: Secondary | ICD-10-CM | POA: Diagnosis not present

## 2019-11-08 DIAGNOSIS — Z4801 Encounter for change or removal of surgical wound dressing: Secondary | ICD-10-CM | POA: Diagnosis not present

## 2019-11-08 DIAGNOSIS — E440 Moderate protein-calorie malnutrition: Secondary | ICD-10-CM | POA: Diagnosis not present

## 2019-11-08 DIAGNOSIS — K7460 Unspecified cirrhosis of liver: Secondary | ICD-10-CM | POA: Diagnosis not present

## 2019-11-08 DIAGNOSIS — M549 Dorsalgia, unspecified: Secondary | ICD-10-CM | POA: Diagnosis not present

## 2019-11-08 DIAGNOSIS — Z85038 Personal history of other malignant neoplasm of large intestine: Secondary | ICD-10-CM | POA: Diagnosis not present

## 2019-11-08 DIAGNOSIS — M1990 Unspecified osteoarthritis, unspecified site: Secondary | ICD-10-CM | POA: Diagnosis not present

## 2019-11-08 DIAGNOSIS — Z433 Encounter for attention to colostomy: Secondary | ICD-10-CM | POA: Diagnosis not present

## 2019-11-08 DIAGNOSIS — Z483 Aftercare following surgery for neoplasm: Secondary | ICD-10-CM | POA: Diagnosis not present

## 2019-11-08 DIAGNOSIS — E785 Hyperlipidemia, unspecified: Secondary | ICD-10-CM | POA: Diagnosis not present

## 2019-11-08 DIAGNOSIS — Z8572 Personal history of non-Hodgkin lymphomas: Secondary | ICD-10-CM | POA: Diagnosis not present

## 2019-11-08 DIAGNOSIS — I129 Hypertensive chronic kidney disease with stage 1 through stage 4 chronic kidney disease, or unspecified chronic kidney disease: Secondary | ICD-10-CM | POA: Diagnosis not present

## 2019-11-08 DIAGNOSIS — H9193 Unspecified hearing loss, bilateral: Secondary | ICD-10-CM | POA: Diagnosis not present

## 2019-11-08 DIAGNOSIS — C44612 Basal cell carcinoma of skin of right upper limb, including shoulder: Secondary | ICD-10-CM | POA: Diagnosis not present

## 2019-11-08 DIAGNOSIS — N183 Chronic kidney disease, stage 3 unspecified: Secondary | ICD-10-CM | POA: Diagnosis not present

## 2019-11-08 DIAGNOSIS — R1314 Dysphagia, pharyngoesophageal phase: Secondary | ICD-10-CM | POA: Diagnosis not present

## 2019-11-08 DIAGNOSIS — J90 Pleural effusion, not elsewhere classified: Secondary | ICD-10-CM | POA: Diagnosis not present

## 2019-11-08 DIAGNOSIS — R188 Other ascites: Secondary | ICD-10-CM | POA: Diagnosis not present

## 2019-11-08 DIAGNOSIS — D61818 Other pancytopenia: Secondary | ICD-10-CM | POA: Diagnosis not present

## 2019-11-08 DIAGNOSIS — J9601 Acute respiratory failure with hypoxia: Secondary | ICD-10-CM | POA: Diagnosis not present

## 2019-11-10 ENCOUNTER — Ambulatory Visit: Payer: Medicare Other | Admitting: Nurse Practitioner

## 2019-11-10 ENCOUNTER — Inpatient Hospital Stay: Payer: Medicare Other | Admitting: Nurse Practitioner

## 2019-11-13 DIAGNOSIS — J90 Pleural effusion, not elsewhere classified: Secondary | ICD-10-CM | POA: Diagnosis not present

## 2019-11-13 DIAGNOSIS — N183 Chronic kidney disease, stage 3 unspecified: Secondary | ICD-10-CM | POA: Diagnosis not present

## 2019-11-13 DIAGNOSIS — C44612 Basal cell carcinoma of skin of right upper limb, including shoulder: Secondary | ICD-10-CM | POA: Diagnosis not present

## 2019-11-13 DIAGNOSIS — C44629 Squamous cell carcinoma of skin of left upper limb, including shoulder: Secondary | ICD-10-CM | POA: Diagnosis not present

## 2019-11-13 DIAGNOSIS — E440 Moderate protein-calorie malnutrition: Secondary | ICD-10-CM | POA: Diagnosis not present

## 2019-11-13 DIAGNOSIS — I129 Hypertensive chronic kidney disease with stage 1 through stage 4 chronic kidney disease, or unspecified chronic kidney disease: Secondary | ICD-10-CM | POA: Diagnosis not present

## 2019-11-13 DIAGNOSIS — Z85038 Personal history of other malignant neoplasm of large intestine: Secondary | ICD-10-CM | POA: Diagnosis not present

## 2019-11-13 DIAGNOSIS — Z433 Encounter for attention to colostomy: Secondary | ICD-10-CM | POA: Diagnosis not present

## 2019-11-13 DIAGNOSIS — C44622 Squamous cell carcinoma of skin of right upper limb, including shoulder: Secondary | ICD-10-CM | POA: Diagnosis not present

## 2019-11-13 DIAGNOSIS — K7460 Unspecified cirrhosis of liver: Secondary | ICD-10-CM | POA: Diagnosis not present

## 2019-11-13 DIAGNOSIS — E785 Hyperlipidemia, unspecified: Secondary | ICD-10-CM | POA: Diagnosis not present

## 2019-11-13 DIAGNOSIS — D1801 Hemangioma of skin and subcutaneous tissue: Secondary | ICD-10-CM | POA: Diagnosis not present

## 2019-11-13 DIAGNOSIS — Z85828 Personal history of other malignant neoplasm of skin: Secondary | ICD-10-CM | POA: Diagnosis not present

## 2019-11-13 DIAGNOSIS — Z8572 Personal history of non-Hodgkin lymphomas: Secondary | ICD-10-CM | POA: Diagnosis not present

## 2019-11-13 DIAGNOSIS — R188 Other ascites: Secondary | ICD-10-CM | POA: Diagnosis not present

## 2019-11-13 DIAGNOSIS — D0462 Carcinoma in situ of skin of left upper limb, including shoulder: Secondary | ICD-10-CM | POA: Diagnosis not present

## 2019-11-13 DIAGNOSIS — D0461 Carcinoma in situ of skin of right upper limb, including shoulder: Secondary | ICD-10-CM | POA: Diagnosis not present

## 2019-11-13 DIAGNOSIS — M1990 Unspecified osteoarthritis, unspecified site: Secondary | ICD-10-CM | POA: Diagnosis not present

## 2019-11-13 DIAGNOSIS — L821 Other seborrheic keratosis: Secondary | ICD-10-CM | POA: Diagnosis not present

## 2019-11-13 DIAGNOSIS — Z483 Aftercare following surgery for neoplasm: Secondary | ICD-10-CM | POA: Diagnosis not present

## 2019-11-13 DIAGNOSIS — M549 Dorsalgia, unspecified: Secondary | ICD-10-CM | POA: Diagnosis not present

## 2019-11-13 DIAGNOSIS — H9193 Unspecified hearing loss, bilateral: Secondary | ICD-10-CM | POA: Diagnosis not present

## 2019-11-13 DIAGNOSIS — R1314 Dysphagia, pharyngoesophageal phase: Secondary | ICD-10-CM | POA: Diagnosis not present

## 2019-11-13 DIAGNOSIS — L570 Actinic keratosis: Secondary | ICD-10-CM | POA: Diagnosis not present

## 2019-11-13 DIAGNOSIS — J9601 Acute respiratory failure with hypoxia: Secondary | ICD-10-CM | POA: Diagnosis not present

## 2019-11-13 DIAGNOSIS — G8929 Other chronic pain: Secondary | ICD-10-CM | POA: Diagnosis not present

## 2019-11-13 DIAGNOSIS — Z4801 Encounter for change or removal of surgical wound dressing: Secondary | ICD-10-CM | POA: Diagnosis not present

## 2019-11-13 DIAGNOSIS — D61818 Other pancytopenia: Secondary | ICD-10-CM | POA: Diagnosis not present

## 2019-11-14 DIAGNOSIS — M1990 Unspecified osteoarthritis, unspecified site: Secondary | ICD-10-CM | POA: Diagnosis not present

## 2019-11-14 DIAGNOSIS — D61818 Other pancytopenia: Secondary | ICD-10-CM | POA: Diagnosis not present

## 2019-11-14 DIAGNOSIS — J9601 Acute respiratory failure with hypoxia: Secondary | ICD-10-CM | POA: Diagnosis not present

## 2019-11-14 DIAGNOSIS — E785 Hyperlipidemia, unspecified: Secondary | ICD-10-CM | POA: Diagnosis not present

## 2019-11-14 DIAGNOSIS — J90 Pleural effusion, not elsewhere classified: Secondary | ICD-10-CM | POA: Diagnosis not present

## 2019-11-14 DIAGNOSIS — R188 Other ascites: Secondary | ICD-10-CM | POA: Diagnosis not present

## 2019-11-14 DIAGNOSIS — H9193 Unspecified hearing loss, bilateral: Secondary | ICD-10-CM | POA: Diagnosis not present

## 2019-11-14 DIAGNOSIS — Z483 Aftercare following surgery for neoplasm: Secondary | ICD-10-CM | POA: Diagnosis not present

## 2019-11-14 DIAGNOSIS — Z433 Encounter for attention to colostomy: Secondary | ICD-10-CM | POA: Diagnosis not present

## 2019-11-14 DIAGNOSIS — C44612 Basal cell carcinoma of skin of right upper limb, including shoulder: Secondary | ICD-10-CM | POA: Diagnosis not present

## 2019-11-14 DIAGNOSIS — Z85038 Personal history of other malignant neoplasm of large intestine: Secondary | ICD-10-CM | POA: Diagnosis not present

## 2019-11-14 DIAGNOSIS — E440 Moderate protein-calorie malnutrition: Secondary | ICD-10-CM | POA: Diagnosis not present

## 2019-11-14 DIAGNOSIS — Z4801 Encounter for change or removal of surgical wound dressing: Secondary | ICD-10-CM | POA: Diagnosis not present

## 2019-11-14 DIAGNOSIS — R1314 Dysphagia, pharyngoesophageal phase: Secondary | ICD-10-CM | POA: Diagnosis not present

## 2019-11-14 DIAGNOSIS — I129 Hypertensive chronic kidney disease with stage 1 through stage 4 chronic kidney disease, or unspecified chronic kidney disease: Secondary | ICD-10-CM | POA: Diagnosis not present

## 2019-11-14 DIAGNOSIS — Z8572 Personal history of non-Hodgkin lymphomas: Secondary | ICD-10-CM | POA: Diagnosis not present

## 2019-11-14 DIAGNOSIS — M549 Dorsalgia, unspecified: Secondary | ICD-10-CM | POA: Diagnosis not present

## 2019-11-14 DIAGNOSIS — G8929 Other chronic pain: Secondary | ICD-10-CM | POA: Diagnosis not present

## 2019-11-14 DIAGNOSIS — N183 Chronic kidney disease, stage 3 unspecified: Secondary | ICD-10-CM | POA: Diagnosis not present

## 2019-11-14 DIAGNOSIS — K7460 Unspecified cirrhosis of liver: Secondary | ICD-10-CM | POA: Diagnosis not present

## 2019-11-16 DIAGNOSIS — R601 Generalized edema: Secondary | ICD-10-CM | POA: Diagnosis not present

## 2019-11-16 DIAGNOSIS — Z23 Encounter for immunization: Secondary | ICD-10-CM | POA: Diagnosis not present

## 2019-11-16 DIAGNOSIS — I951 Orthostatic hypotension: Secondary | ICD-10-CM | POA: Diagnosis not present

## 2019-11-16 DIAGNOSIS — E876 Hypokalemia: Secondary | ICD-10-CM | POA: Diagnosis not present

## 2019-11-16 DIAGNOSIS — C44622 Squamous cell carcinoma of skin of right upper limb, including shoulder: Secondary | ICD-10-CM | POA: Diagnosis not present

## 2019-11-16 DIAGNOSIS — C44529 Squamous cell carcinoma of skin of other part of trunk: Secondary | ICD-10-CM | POA: Diagnosis not present

## 2019-11-16 DIAGNOSIS — K7460 Unspecified cirrhosis of liver: Secondary | ICD-10-CM | POA: Diagnosis not present

## 2019-11-16 DIAGNOSIS — D61818 Other pancytopenia: Secondary | ICD-10-CM | POA: Diagnosis not present

## 2019-11-20 DIAGNOSIS — R7401 Elevation of levels of liver transaminase levels: Secondary | ICD-10-CM | POA: Diagnosis not present

## 2019-11-20 DIAGNOSIS — K7460 Unspecified cirrhosis of liver: Secondary | ICD-10-CM | POA: Diagnosis not present

## 2019-11-20 DIAGNOSIS — C8339 Diffuse large B-cell lymphoma, extranodal and solid organ sites: Secondary | ICD-10-CM | POA: Diagnosis not present

## 2019-11-21 ENCOUNTER — Encounter: Payer: Self-pay | Admitting: *Deleted

## 2019-11-21 DIAGNOSIS — H9193 Unspecified hearing loss, bilateral: Secondary | ICD-10-CM | POA: Diagnosis not present

## 2019-11-21 DIAGNOSIS — G8929 Other chronic pain: Secondary | ICD-10-CM | POA: Diagnosis not present

## 2019-11-21 DIAGNOSIS — R188 Other ascites: Secondary | ICD-10-CM | POA: Diagnosis not present

## 2019-11-21 DIAGNOSIS — M549 Dorsalgia, unspecified: Secondary | ICD-10-CM | POA: Diagnosis not present

## 2019-11-21 DIAGNOSIS — N183 Chronic kidney disease, stage 3 unspecified: Secondary | ICD-10-CM | POA: Diagnosis not present

## 2019-11-21 DIAGNOSIS — R1314 Dysphagia, pharyngoesophageal phase: Secondary | ICD-10-CM | POA: Diagnosis not present

## 2019-11-21 DIAGNOSIS — J90 Pleural effusion, not elsewhere classified: Secondary | ICD-10-CM | POA: Diagnosis not present

## 2019-11-21 DIAGNOSIS — K7460 Unspecified cirrhosis of liver: Secondary | ICD-10-CM | POA: Diagnosis not present

## 2019-11-21 DIAGNOSIS — Z85038 Personal history of other malignant neoplasm of large intestine: Secondary | ICD-10-CM | POA: Diagnosis not present

## 2019-11-21 DIAGNOSIS — M1990 Unspecified osteoarthritis, unspecified site: Secondary | ICD-10-CM | POA: Diagnosis not present

## 2019-11-21 DIAGNOSIS — J9601 Acute respiratory failure with hypoxia: Secondary | ICD-10-CM | POA: Diagnosis not present

## 2019-11-21 DIAGNOSIS — E785 Hyperlipidemia, unspecified: Secondary | ICD-10-CM | POA: Diagnosis not present

## 2019-11-21 DIAGNOSIS — D61818 Other pancytopenia: Secondary | ICD-10-CM | POA: Diagnosis not present

## 2019-11-21 DIAGNOSIS — Z483 Aftercare following surgery for neoplasm: Secondary | ICD-10-CM | POA: Diagnosis not present

## 2019-11-21 DIAGNOSIS — Z433 Encounter for attention to colostomy: Secondary | ICD-10-CM | POA: Diagnosis not present

## 2019-11-21 DIAGNOSIS — C44612 Basal cell carcinoma of skin of right upper limb, including shoulder: Secondary | ICD-10-CM | POA: Diagnosis not present

## 2019-11-21 DIAGNOSIS — Z8572 Personal history of non-Hodgkin lymphomas: Secondary | ICD-10-CM | POA: Diagnosis not present

## 2019-11-21 DIAGNOSIS — I129 Hypertensive chronic kidney disease with stage 1 through stage 4 chronic kidney disease, or unspecified chronic kidney disease: Secondary | ICD-10-CM | POA: Diagnosis not present

## 2019-11-21 DIAGNOSIS — E440 Moderate protein-calorie malnutrition: Secondary | ICD-10-CM | POA: Diagnosis not present

## 2019-11-21 DIAGNOSIS — Z4801 Encounter for change or removal of surgical wound dressing: Secondary | ICD-10-CM | POA: Diagnosis not present

## 2019-11-21 NOTE — Progress Notes (Signed)
Per Dr. Benay Spice: Needs f/u appointment in 1 month. Can be video/telephone as well. Scheduling messages sent.

## 2019-11-22 ENCOUNTER — Telehealth: Payer: Self-pay | Admitting: Oncology

## 2019-11-22 DIAGNOSIS — C44612 Basal cell carcinoma of skin of right upper limb, including shoulder: Secondary | ICD-10-CM | POA: Diagnosis not present

## 2019-11-22 DIAGNOSIS — G8929 Other chronic pain: Secondary | ICD-10-CM | POA: Diagnosis not present

## 2019-11-22 DIAGNOSIS — Z85038 Personal history of other malignant neoplasm of large intestine: Secondary | ICD-10-CM | POA: Diagnosis not present

## 2019-11-22 DIAGNOSIS — E785 Hyperlipidemia, unspecified: Secondary | ICD-10-CM | POA: Diagnosis not present

## 2019-11-22 DIAGNOSIS — R188 Other ascites: Secondary | ICD-10-CM | POA: Diagnosis not present

## 2019-11-22 DIAGNOSIS — Z8572 Personal history of non-Hodgkin lymphomas: Secondary | ICD-10-CM | POA: Diagnosis not present

## 2019-11-22 DIAGNOSIS — J9601 Acute respiratory failure with hypoxia: Secondary | ICD-10-CM | POA: Diagnosis not present

## 2019-11-22 DIAGNOSIS — D61818 Other pancytopenia: Secondary | ICD-10-CM | POA: Diagnosis not present

## 2019-11-22 DIAGNOSIS — I129 Hypertensive chronic kidney disease with stage 1 through stage 4 chronic kidney disease, or unspecified chronic kidney disease: Secondary | ICD-10-CM | POA: Diagnosis not present

## 2019-11-22 DIAGNOSIS — Z483 Aftercare following surgery for neoplasm: Secondary | ICD-10-CM | POA: Diagnosis not present

## 2019-11-22 DIAGNOSIS — R1314 Dysphagia, pharyngoesophageal phase: Secondary | ICD-10-CM | POA: Diagnosis not present

## 2019-11-22 DIAGNOSIS — Z4801 Encounter for change or removal of surgical wound dressing: Secondary | ICD-10-CM | POA: Diagnosis not present

## 2019-11-22 DIAGNOSIS — E440 Moderate protein-calorie malnutrition: Secondary | ICD-10-CM | POA: Diagnosis not present

## 2019-11-22 DIAGNOSIS — Z433 Encounter for attention to colostomy: Secondary | ICD-10-CM | POA: Diagnosis not present

## 2019-11-22 DIAGNOSIS — H9193 Unspecified hearing loss, bilateral: Secondary | ICD-10-CM | POA: Diagnosis not present

## 2019-11-22 DIAGNOSIS — M1990 Unspecified osteoarthritis, unspecified site: Secondary | ICD-10-CM | POA: Diagnosis not present

## 2019-11-22 DIAGNOSIS — K7460 Unspecified cirrhosis of liver: Secondary | ICD-10-CM | POA: Diagnosis not present

## 2019-11-22 DIAGNOSIS — M549 Dorsalgia, unspecified: Secondary | ICD-10-CM | POA: Diagnosis not present

## 2019-11-22 DIAGNOSIS — J90 Pleural effusion, not elsewhere classified: Secondary | ICD-10-CM | POA: Diagnosis not present

## 2019-11-22 DIAGNOSIS — N183 Chronic kidney disease, stage 3 unspecified: Secondary | ICD-10-CM | POA: Diagnosis not present

## 2019-11-22 NOTE — Telephone Encounter (Signed)
Scheduled appt per 12/8 sch message - pt is aware of appt date and time and that is is a phone call

## 2019-11-23 DIAGNOSIS — C44612 Basal cell carcinoma of skin of right upper limb, including shoulder: Secondary | ICD-10-CM | POA: Diagnosis not present

## 2019-11-23 DIAGNOSIS — E440 Moderate protein-calorie malnutrition: Secondary | ICD-10-CM | POA: Diagnosis not present

## 2019-11-23 DIAGNOSIS — Z85038 Personal history of other malignant neoplasm of large intestine: Secondary | ICD-10-CM | POA: Diagnosis not present

## 2019-11-23 DIAGNOSIS — R1314 Dysphagia, pharyngoesophageal phase: Secondary | ICD-10-CM | POA: Diagnosis not present

## 2019-11-23 DIAGNOSIS — M1990 Unspecified osteoarthritis, unspecified site: Secondary | ICD-10-CM | POA: Diagnosis not present

## 2019-11-23 DIAGNOSIS — E785 Hyperlipidemia, unspecified: Secondary | ICD-10-CM | POA: Diagnosis not present

## 2019-11-23 DIAGNOSIS — J9601 Acute respiratory failure with hypoxia: Secondary | ICD-10-CM | POA: Diagnosis not present

## 2019-11-23 DIAGNOSIS — Z433 Encounter for attention to colostomy: Secondary | ICD-10-CM | POA: Diagnosis not present

## 2019-11-23 DIAGNOSIS — Z8572 Personal history of non-Hodgkin lymphomas: Secondary | ICD-10-CM | POA: Diagnosis not present

## 2019-11-23 DIAGNOSIS — N183 Chronic kidney disease, stage 3 unspecified: Secondary | ICD-10-CM | POA: Diagnosis not present

## 2019-11-23 DIAGNOSIS — H9193 Unspecified hearing loss, bilateral: Secondary | ICD-10-CM | POA: Diagnosis not present

## 2019-11-23 DIAGNOSIS — K7460 Unspecified cirrhosis of liver: Secondary | ICD-10-CM | POA: Diagnosis not present

## 2019-11-23 DIAGNOSIS — M549 Dorsalgia, unspecified: Secondary | ICD-10-CM | POA: Diagnosis not present

## 2019-11-23 DIAGNOSIS — R188 Other ascites: Secondary | ICD-10-CM | POA: Diagnosis not present

## 2019-11-23 DIAGNOSIS — G8929 Other chronic pain: Secondary | ICD-10-CM | POA: Diagnosis not present

## 2019-11-23 DIAGNOSIS — D61818 Other pancytopenia: Secondary | ICD-10-CM | POA: Diagnosis not present

## 2019-11-23 DIAGNOSIS — Z4801 Encounter for change or removal of surgical wound dressing: Secondary | ICD-10-CM | POA: Diagnosis not present

## 2019-11-23 DIAGNOSIS — Z483 Aftercare following surgery for neoplasm: Secondary | ICD-10-CM | POA: Diagnosis not present

## 2019-11-23 DIAGNOSIS — J90 Pleural effusion, not elsewhere classified: Secondary | ICD-10-CM | POA: Diagnosis not present

## 2019-11-23 DIAGNOSIS — I129 Hypertensive chronic kidney disease with stage 1 through stage 4 chronic kidney disease, or unspecified chronic kidney disease: Secondary | ICD-10-CM | POA: Diagnosis not present

## 2019-11-28 DIAGNOSIS — Z85038 Personal history of other malignant neoplasm of large intestine: Secondary | ICD-10-CM | POA: Diagnosis not present

## 2019-11-28 DIAGNOSIS — Z433 Encounter for attention to colostomy: Secondary | ICD-10-CM | POA: Diagnosis not present

## 2019-11-28 DIAGNOSIS — N183 Chronic kidney disease, stage 3 unspecified: Secondary | ICD-10-CM | POA: Diagnosis not present

## 2019-11-28 DIAGNOSIS — K7460 Unspecified cirrhosis of liver: Secondary | ICD-10-CM | POA: Diagnosis not present

## 2019-11-28 DIAGNOSIS — R1314 Dysphagia, pharyngoesophageal phase: Secondary | ICD-10-CM | POA: Diagnosis not present

## 2019-11-28 DIAGNOSIS — H9193 Unspecified hearing loss, bilateral: Secondary | ICD-10-CM | POA: Diagnosis not present

## 2019-11-28 DIAGNOSIS — M1990 Unspecified osteoarthritis, unspecified site: Secondary | ICD-10-CM | POA: Diagnosis not present

## 2019-11-28 DIAGNOSIS — R188 Other ascites: Secondary | ICD-10-CM | POA: Diagnosis not present

## 2019-11-28 DIAGNOSIS — J9601 Acute respiratory failure with hypoxia: Secondary | ICD-10-CM | POA: Diagnosis not present

## 2019-11-28 DIAGNOSIS — Z4801 Encounter for change or removal of surgical wound dressing: Secondary | ICD-10-CM | POA: Diagnosis not present

## 2019-11-28 DIAGNOSIS — E440 Moderate protein-calorie malnutrition: Secondary | ICD-10-CM | POA: Diagnosis not present

## 2019-11-28 DIAGNOSIS — I129 Hypertensive chronic kidney disease with stage 1 through stage 4 chronic kidney disease, or unspecified chronic kidney disease: Secondary | ICD-10-CM | POA: Diagnosis not present

## 2019-11-28 DIAGNOSIS — C44612 Basal cell carcinoma of skin of right upper limb, including shoulder: Secondary | ICD-10-CM | POA: Diagnosis not present

## 2019-11-28 DIAGNOSIS — E785 Hyperlipidemia, unspecified: Secondary | ICD-10-CM | POA: Diagnosis not present

## 2019-11-28 DIAGNOSIS — Z483 Aftercare following surgery for neoplasm: Secondary | ICD-10-CM | POA: Diagnosis not present

## 2019-11-28 DIAGNOSIS — J90 Pleural effusion, not elsewhere classified: Secondary | ICD-10-CM | POA: Diagnosis not present

## 2019-11-28 DIAGNOSIS — D61818 Other pancytopenia: Secondary | ICD-10-CM | POA: Diagnosis not present

## 2019-11-28 DIAGNOSIS — M549 Dorsalgia, unspecified: Secondary | ICD-10-CM | POA: Diagnosis not present

## 2019-11-28 DIAGNOSIS — Z8572 Personal history of non-Hodgkin lymphomas: Secondary | ICD-10-CM | POA: Diagnosis not present

## 2019-11-28 DIAGNOSIS — G8929 Other chronic pain: Secondary | ICD-10-CM | POA: Diagnosis not present

## 2019-11-29 DIAGNOSIS — J90 Pleural effusion, not elsewhere classified: Secondary | ICD-10-CM | POA: Diagnosis not present

## 2019-11-29 DIAGNOSIS — E440 Moderate protein-calorie malnutrition: Secondary | ICD-10-CM | POA: Diagnosis not present

## 2019-11-29 DIAGNOSIS — J9601 Acute respiratory failure with hypoxia: Secondary | ICD-10-CM | POA: Diagnosis not present

## 2019-11-29 DIAGNOSIS — Z8572 Personal history of non-Hodgkin lymphomas: Secondary | ICD-10-CM | POA: Diagnosis not present

## 2019-11-29 DIAGNOSIS — Z4801 Encounter for change or removal of surgical wound dressing: Secondary | ICD-10-CM | POA: Diagnosis not present

## 2019-11-29 DIAGNOSIS — E785 Hyperlipidemia, unspecified: Secondary | ICD-10-CM | POA: Diagnosis not present

## 2019-11-29 DIAGNOSIS — H9193 Unspecified hearing loss, bilateral: Secondary | ICD-10-CM | POA: Diagnosis not present

## 2019-11-29 DIAGNOSIS — M549 Dorsalgia, unspecified: Secondary | ICD-10-CM | POA: Diagnosis not present

## 2019-11-29 DIAGNOSIS — R1314 Dysphagia, pharyngoesophageal phase: Secondary | ICD-10-CM | POA: Diagnosis not present

## 2019-11-29 DIAGNOSIS — Z433 Encounter for attention to colostomy: Secondary | ICD-10-CM | POA: Diagnosis not present

## 2019-11-29 DIAGNOSIS — Z85038 Personal history of other malignant neoplasm of large intestine: Secondary | ICD-10-CM | POA: Diagnosis not present

## 2019-11-29 DIAGNOSIS — I129 Hypertensive chronic kidney disease with stage 1 through stage 4 chronic kidney disease, or unspecified chronic kidney disease: Secondary | ICD-10-CM | POA: Diagnosis not present

## 2019-11-29 DIAGNOSIS — D61818 Other pancytopenia: Secondary | ICD-10-CM | POA: Diagnosis not present

## 2019-11-29 DIAGNOSIS — C44612 Basal cell carcinoma of skin of right upper limb, including shoulder: Secondary | ICD-10-CM | POA: Diagnosis not present

## 2019-11-29 DIAGNOSIS — M1990 Unspecified osteoarthritis, unspecified site: Secondary | ICD-10-CM | POA: Diagnosis not present

## 2019-11-29 DIAGNOSIS — G8929 Other chronic pain: Secondary | ICD-10-CM | POA: Diagnosis not present

## 2019-11-29 DIAGNOSIS — R188 Other ascites: Secondary | ICD-10-CM | POA: Diagnosis not present

## 2019-11-29 DIAGNOSIS — N183 Chronic kidney disease, stage 3 unspecified: Secondary | ICD-10-CM | POA: Diagnosis not present

## 2019-11-29 DIAGNOSIS — K7460 Unspecified cirrhosis of liver: Secondary | ICD-10-CM | POA: Diagnosis not present

## 2019-11-29 DIAGNOSIS — Z483 Aftercare following surgery for neoplasm: Secondary | ICD-10-CM | POA: Diagnosis not present

## 2019-11-30 DIAGNOSIS — K7460 Unspecified cirrhosis of liver: Secondary | ICD-10-CM | POA: Diagnosis not present

## 2019-11-30 DIAGNOSIS — D61818 Other pancytopenia: Secondary | ICD-10-CM | POA: Diagnosis not present

## 2019-11-30 DIAGNOSIS — R188 Other ascites: Secondary | ICD-10-CM | POA: Diagnosis not present

## 2019-11-30 DIAGNOSIS — H9193 Unspecified hearing loss, bilateral: Secondary | ICD-10-CM | POA: Diagnosis not present

## 2019-11-30 DIAGNOSIS — E785 Hyperlipidemia, unspecified: Secondary | ICD-10-CM | POA: Diagnosis not present

## 2019-11-30 DIAGNOSIS — Z85038 Personal history of other malignant neoplasm of large intestine: Secondary | ICD-10-CM | POA: Diagnosis not present

## 2019-11-30 DIAGNOSIS — Z433 Encounter for attention to colostomy: Secondary | ICD-10-CM | POA: Diagnosis not present

## 2019-11-30 DIAGNOSIS — R1314 Dysphagia, pharyngoesophageal phase: Secondary | ICD-10-CM | POA: Diagnosis not present

## 2019-11-30 DIAGNOSIS — M549 Dorsalgia, unspecified: Secondary | ICD-10-CM | POA: Diagnosis not present

## 2019-11-30 DIAGNOSIS — Z8572 Personal history of non-Hodgkin lymphomas: Secondary | ICD-10-CM | POA: Diagnosis not present

## 2019-11-30 DIAGNOSIS — E440 Moderate protein-calorie malnutrition: Secondary | ICD-10-CM | POA: Diagnosis not present

## 2019-11-30 DIAGNOSIS — Z483 Aftercare following surgery for neoplasm: Secondary | ICD-10-CM | POA: Diagnosis not present

## 2019-11-30 DIAGNOSIS — N183 Chronic kidney disease, stage 3 unspecified: Secondary | ICD-10-CM | POA: Diagnosis not present

## 2019-11-30 DIAGNOSIS — G8929 Other chronic pain: Secondary | ICD-10-CM | POA: Diagnosis not present

## 2019-11-30 DIAGNOSIS — J9601 Acute respiratory failure with hypoxia: Secondary | ICD-10-CM | POA: Diagnosis not present

## 2019-11-30 DIAGNOSIS — M1990 Unspecified osteoarthritis, unspecified site: Secondary | ICD-10-CM | POA: Diagnosis not present

## 2019-11-30 DIAGNOSIS — Z4801 Encounter for change or removal of surgical wound dressing: Secondary | ICD-10-CM | POA: Diagnosis not present

## 2019-11-30 DIAGNOSIS — J90 Pleural effusion, not elsewhere classified: Secondary | ICD-10-CM | POA: Diagnosis not present

## 2019-11-30 DIAGNOSIS — C44612 Basal cell carcinoma of skin of right upper limb, including shoulder: Secondary | ICD-10-CM | POA: Diagnosis not present

## 2019-11-30 DIAGNOSIS — I129 Hypertensive chronic kidney disease with stage 1 through stage 4 chronic kidney disease, or unspecified chronic kidney disease: Secondary | ICD-10-CM | POA: Diagnosis not present

## 2019-12-04 DIAGNOSIS — G8929 Other chronic pain: Secondary | ICD-10-CM | POA: Diagnosis not present

## 2019-12-04 DIAGNOSIS — Z483 Aftercare following surgery for neoplasm: Secondary | ICD-10-CM | POA: Diagnosis not present

## 2019-12-04 DIAGNOSIS — C44612 Basal cell carcinoma of skin of right upper limb, including shoulder: Secondary | ICD-10-CM | POA: Diagnosis not present

## 2019-12-04 DIAGNOSIS — Z8572 Personal history of non-Hodgkin lymphomas: Secondary | ICD-10-CM | POA: Diagnosis not present

## 2019-12-04 DIAGNOSIS — J9601 Acute respiratory failure with hypoxia: Secondary | ICD-10-CM | POA: Diagnosis not present

## 2019-12-04 DIAGNOSIS — E440 Moderate protein-calorie malnutrition: Secondary | ICD-10-CM | POA: Diagnosis not present

## 2019-12-04 DIAGNOSIS — D61818 Other pancytopenia: Secondary | ICD-10-CM | POA: Diagnosis not present

## 2019-12-04 DIAGNOSIS — H9193 Unspecified hearing loss, bilateral: Secondary | ICD-10-CM | POA: Diagnosis not present

## 2019-12-04 DIAGNOSIS — E785 Hyperlipidemia, unspecified: Secondary | ICD-10-CM | POA: Diagnosis not present

## 2019-12-04 DIAGNOSIS — I129 Hypertensive chronic kidney disease with stage 1 through stage 4 chronic kidney disease, or unspecified chronic kidney disease: Secondary | ICD-10-CM | POA: Diagnosis not present

## 2019-12-04 DIAGNOSIS — R1314 Dysphagia, pharyngoesophageal phase: Secondary | ICD-10-CM | POA: Diagnosis not present

## 2019-12-04 DIAGNOSIS — N183 Chronic kidney disease, stage 3 unspecified: Secondary | ICD-10-CM | POA: Diagnosis not present

## 2019-12-04 DIAGNOSIS — J90 Pleural effusion, not elsewhere classified: Secondary | ICD-10-CM | POA: Diagnosis not present

## 2019-12-04 DIAGNOSIS — Z433 Encounter for attention to colostomy: Secondary | ICD-10-CM | POA: Diagnosis not present

## 2019-12-04 DIAGNOSIS — Z85038 Personal history of other malignant neoplasm of large intestine: Secondary | ICD-10-CM | POA: Diagnosis not present

## 2019-12-04 DIAGNOSIS — Z4801 Encounter for change or removal of surgical wound dressing: Secondary | ICD-10-CM | POA: Diagnosis not present

## 2019-12-04 DIAGNOSIS — R188 Other ascites: Secondary | ICD-10-CM | POA: Diagnosis not present

## 2019-12-04 DIAGNOSIS — K7460 Unspecified cirrhosis of liver: Secondary | ICD-10-CM | POA: Diagnosis not present

## 2019-12-04 DIAGNOSIS — M1990 Unspecified osteoarthritis, unspecified site: Secondary | ICD-10-CM | POA: Diagnosis not present

## 2019-12-04 DIAGNOSIS — M549 Dorsalgia, unspecified: Secondary | ICD-10-CM | POA: Diagnosis not present

## 2019-12-05 DIAGNOSIS — K7460 Unspecified cirrhosis of liver: Secondary | ICD-10-CM | POA: Diagnosis not present

## 2019-12-05 DIAGNOSIS — E785 Hyperlipidemia, unspecified: Secondary | ICD-10-CM | POA: Diagnosis not present

## 2019-12-05 DIAGNOSIS — Z85038 Personal history of other malignant neoplasm of large intestine: Secondary | ICD-10-CM | POA: Diagnosis not present

## 2019-12-05 DIAGNOSIS — H9193 Unspecified hearing loss, bilateral: Secondary | ICD-10-CM | POA: Diagnosis not present

## 2019-12-05 DIAGNOSIS — Z483 Aftercare following surgery for neoplasm: Secondary | ICD-10-CM | POA: Diagnosis not present

## 2019-12-05 DIAGNOSIS — D61818 Other pancytopenia: Secondary | ICD-10-CM | POA: Diagnosis not present

## 2019-12-05 DIAGNOSIS — G8929 Other chronic pain: Secondary | ICD-10-CM | POA: Diagnosis not present

## 2019-12-05 DIAGNOSIS — C44612 Basal cell carcinoma of skin of right upper limb, including shoulder: Secondary | ICD-10-CM | POA: Diagnosis not present

## 2019-12-05 DIAGNOSIS — J90 Pleural effusion, not elsewhere classified: Secondary | ICD-10-CM | POA: Diagnosis not present

## 2019-12-05 DIAGNOSIS — J9601 Acute respiratory failure with hypoxia: Secondary | ICD-10-CM | POA: Diagnosis not present

## 2019-12-05 DIAGNOSIS — I129 Hypertensive chronic kidney disease with stage 1 through stage 4 chronic kidney disease, or unspecified chronic kidney disease: Secondary | ICD-10-CM | POA: Diagnosis not present

## 2019-12-05 DIAGNOSIS — Z8572 Personal history of non-Hodgkin lymphomas: Secondary | ICD-10-CM | POA: Diagnosis not present

## 2019-12-05 DIAGNOSIS — E440 Moderate protein-calorie malnutrition: Secondary | ICD-10-CM | POA: Diagnosis not present

## 2019-12-05 DIAGNOSIS — M549 Dorsalgia, unspecified: Secondary | ICD-10-CM | POA: Diagnosis not present

## 2019-12-05 DIAGNOSIS — Z433 Encounter for attention to colostomy: Secondary | ICD-10-CM | POA: Diagnosis not present

## 2019-12-05 DIAGNOSIS — N183 Chronic kidney disease, stage 3 unspecified: Secondary | ICD-10-CM | POA: Diagnosis not present

## 2019-12-05 DIAGNOSIS — R1314 Dysphagia, pharyngoesophageal phase: Secondary | ICD-10-CM | POA: Diagnosis not present

## 2019-12-05 DIAGNOSIS — M1990 Unspecified osteoarthritis, unspecified site: Secondary | ICD-10-CM | POA: Diagnosis not present

## 2019-12-05 DIAGNOSIS — R188 Other ascites: Secondary | ICD-10-CM | POA: Diagnosis not present

## 2019-12-05 DIAGNOSIS — Z4801 Encounter for change or removal of surgical wound dressing: Secondary | ICD-10-CM | POA: Diagnosis not present

## 2019-12-12 DIAGNOSIS — J90 Pleural effusion, not elsewhere classified: Secondary | ICD-10-CM | POA: Diagnosis not present

## 2019-12-12 DIAGNOSIS — Z8572 Personal history of non-Hodgkin lymphomas: Secondary | ICD-10-CM | POA: Diagnosis not present

## 2019-12-12 DIAGNOSIS — C44612 Basal cell carcinoma of skin of right upper limb, including shoulder: Secondary | ICD-10-CM | POA: Diagnosis not present

## 2019-12-12 DIAGNOSIS — Z4801 Encounter for change or removal of surgical wound dressing: Secondary | ICD-10-CM | POA: Diagnosis not present

## 2019-12-12 DIAGNOSIS — M1990 Unspecified osteoarthritis, unspecified site: Secondary | ICD-10-CM | POA: Diagnosis not present

## 2019-12-12 DIAGNOSIS — R1314 Dysphagia, pharyngoesophageal phase: Secondary | ICD-10-CM | POA: Diagnosis not present

## 2019-12-12 DIAGNOSIS — N183 Chronic kidney disease, stage 3 unspecified: Secondary | ICD-10-CM | POA: Diagnosis not present

## 2019-12-12 DIAGNOSIS — Z85038 Personal history of other malignant neoplasm of large intestine: Secondary | ICD-10-CM | POA: Diagnosis not present

## 2019-12-12 DIAGNOSIS — D61818 Other pancytopenia: Secondary | ICD-10-CM | POA: Diagnosis not present

## 2019-12-12 DIAGNOSIS — Z483 Aftercare following surgery for neoplasm: Secondary | ICD-10-CM | POA: Diagnosis not present

## 2019-12-12 DIAGNOSIS — E440 Moderate protein-calorie malnutrition: Secondary | ICD-10-CM | POA: Diagnosis not present

## 2019-12-12 DIAGNOSIS — I129 Hypertensive chronic kidney disease with stage 1 through stage 4 chronic kidney disease, or unspecified chronic kidney disease: Secondary | ICD-10-CM | POA: Diagnosis not present

## 2019-12-12 DIAGNOSIS — J9601 Acute respiratory failure with hypoxia: Secondary | ICD-10-CM | POA: Diagnosis not present

## 2019-12-12 DIAGNOSIS — H9193 Unspecified hearing loss, bilateral: Secondary | ICD-10-CM | POA: Diagnosis not present

## 2019-12-12 DIAGNOSIS — G8929 Other chronic pain: Secondary | ICD-10-CM | POA: Diagnosis not present

## 2019-12-12 DIAGNOSIS — M549 Dorsalgia, unspecified: Secondary | ICD-10-CM | POA: Diagnosis not present

## 2019-12-12 DIAGNOSIS — E785 Hyperlipidemia, unspecified: Secondary | ICD-10-CM | POA: Diagnosis not present

## 2019-12-12 DIAGNOSIS — K7460 Unspecified cirrhosis of liver: Secondary | ICD-10-CM | POA: Diagnosis not present

## 2019-12-12 DIAGNOSIS — Z433 Encounter for attention to colostomy: Secondary | ICD-10-CM | POA: Diagnosis not present

## 2019-12-12 DIAGNOSIS — R188 Other ascites: Secondary | ICD-10-CM | POA: Diagnosis not present

## 2019-12-13 DIAGNOSIS — Z4801 Encounter for change or removal of surgical wound dressing: Secondary | ICD-10-CM | POA: Diagnosis not present

## 2019-12-13 DIAGNOSIS — R188 Other ascites: Secondary | ICD-10-CM | POA: Diagnosis not present

## 2019-12-13 DIAGNOSIS — C44612 Basal cell carcinoma of skin of right upper limb, including shoulder: Secondary | ICD-10-CM | POA: Diagnosis not present

## 2019-12-13 DIAGNOSIS — J90 Pleural effusion, not elsewhere classified: Secondary | ICD-10-CM | POA: Diagnosis not present

## 2019-12-13 DIAGNOSIS — Z483 Aftercare following surgery for neoplasm: Secondary | ICD-10-CM | POA: Diagnosis not present

## 2019-12-13 DIAGNOSIS — R1314 Dysphagia, pharyngoesophageal phase: Secondary | ICD-10-CM | POA: Diagnosis not present

## 2019-12-13 DIAGNOSIS — Z8572 Personal history of non-Hodgkin lymphomas: Secondary | ICD-10-CM | POA: Diagnosis not present

## 2019-12-13 DIAGNOSIS — J9601 Acute respiratory failure with hypoxia: Secondary | ICD-10-CM | POA: Diagnosis not present

## 2019-12-13 DIAGNOSIS — Z433 Encounter for attention to colostomy: Secondary | ICD-10-CM | POA: Diagnosis not present

## 2019-12-13 DIAGNOSIS — H9193 Unspecified hearing loss, bilateral: Secondary | ICD-10-CM | POA: Diagnosis not present

## 2019-12-13 DIAGNOSIS — I129 Hypertensive chronic kidney disease with stage 1 through stage 4 chronic kidney disease, or unspecified chronic kidney disease: Secondary | ICD-10-CM | POA: Diagnosis not present

## 2019-12-13 DIAGNOSIS — E785 Hyperlipidemia, unspecified: Secondary | ICD-10-CM | POA: Diagnosis not present

## 2019-12-13 DIAGNOSIS — M549 Dorsalgia, unspecified: Secondary | ICD-10-CM | POA: Diagnosis not present

## 2019-12-13 DIAGNOSIS — Z85038 Personal history of other malignant neoplasm of large intestine: Secondary | ICD-10-CM | POA: Diagnosis not present

## 2019-12-13 DIAGNOSIS — N183 Chronic kidney disease, stage 3 unspecified: Secondary | ICD-10-CM | POA: Diagnosis not present

## 2019-12-13 DIAGNOSIS — M1990 Unspecified osteoarthritis, unspecified site: Secondary | ICD-10-CM | POA: Diagnosis not present

## 2019-12-13 DIAGNOSIS — K7460 Unspecified cirrhosis of liver: Secondary | ICD-10-CM | POA: Diagnosis not present

## 2019-12-13 DIAGNOSIS — G8929 Other chronic pain: Secondary | ICD-10-CM | POA: Diagnosis not present

## 2019-12-13 DIAGNOSIS — E440 Moderate protein-calorie malnutrition: Secondary | ICD-10-CM | POA: Diagnosis not present

## 2019-12-13 DIAGNOSIS — D61818 Other pancytopenia: Secondary | ICD-10-CM | POA: Diagnosis not present

## 2019-12-19 DIAGNOSIS — J90 Pleural effusion, not elsewhere classified: Secondary | ICD-10-CM | POA: Diagnosis not present

## 2019-12-19 DIAGNOSIS — E785 Hyperlipidemia, unspecified: Secondary | ICD-10-CM | POA: Diagnosis not present

## 2019-12-19 DIAGNOSIS — H9193 Unspecified hearing loss, bilateral: Secondary | ICD-10-CM | POA: Diagnosis not present

## 2019-12-19 DIAGNOSIS — Z433 Encounter for attention to colostomy: Secondary | ICD-10-CM | POA: Diagnosis not present

## 2019-12-19 DIAGNOSIS — J9601 Acute respiratory failure with hypoxia: Secondary | ICD-10-CM | POA: Diagnosis not present

## 2019-12-19 DIAGNOSIS — C44612 Basal cell carcinoma of skin of right upper limb, including shoulder: Secondary | ICD-10-CM | POA: Diagnosis not present

## 2019-12-19 DIAGNOSIS — Z8572 Personal history of non-Hodgkin lymphomas: Secondary | ICD-10-CM | POA: Diagnosis not present

## 2019-12-19 DIAGNOSIS — N183 Chronic kidney disease, stage 3 unspecified: Secondary | ICD-10-CM | POA: Diagnosis not present

## 2019-12-19 DIAGNOSIS — R1314 Dysphagia, pharyngoesophageal phase: Secondary | ICD-10-CM | POA: Diagnosis not present

## 2019-12-19 DIAGNOSIS — R188 Other ascites: Secondary | ICD-10-CM | POA: Diagnosis not present

## 2019-12-19 DIAGNOSIS — Z85038 Personal history of other malignant neoplasm of large intestine: Secondary | ICD-10-CM | POA: Diagnosis not present

## 2019-12-19 DIAGNOSIS — M1990 Unspecified osteoarthritis, unspecified site: Secondary | ICD-10-CM | POA: Diagnosis not present

## 2019-12-19 DIAGNOSIS — Z483 Aftercare following surgery for neoplasm: Secondary | ICD-10-CM | POA: Diagnosis not present

## 2019-12-19 DIAGNOSIS — Z4801 Encounter for change or removal of surgical wound dressing: Secondary | ICD-10-CM | POA: Diagnosis not present

## 2019-12-19 DIAGNOSIS — I129 Hypertensive chronic kidney disease with stage 1 through stage 4 chronic kidney disease, or unspecified chronic kidney disease: Secondary | ICD-10-CM | POA: Diagnosis not present

## 2019-12-19 DIAGNOSIS — K7460 Unspecified cirrhosis of liver: Secondary | ICD-10-CM | POA: Diagnosis not present

## 2019-12-19 DIAGNOSIS — M549 Dorsalgia, unspecified: Secondary | ICD-10-CM | POA: Diagnosis not present

## 2019-12-19 DIAGNOSIS — D61818 Other pancytopenia: Secondary | ICD-10-CM | POA: Diagnosis not present

## 2019-12-19 DIAGNOSIS — G8929 Other chronic pain: Secondary | ICD-10-CM | POA: Diagnosis not present

## 2019-12-19 DIAGNOSIS — E440 Moderate protein-calorie malnutrition: Secondary | ICD-10-CM | POA: Diagnosis not present

## 2019-12-20 DIAGNOSIS — R609 Edema, unspecified: Secondary | ICD-10-CM | POA: Diagnosis not present

## 2019-12-20 DIAGNOSIS — K7460 Unspecified cirrhosis of liver: Secondary | ICD-10-CM | POA: Diagnosis not present

## 2019-12-21 DIAGNOSIS — M549 Dorsalgia, unspecified: Secondary | ICD-10-CM | POA: Diagnosis not present

## 2019-12-21 DIAGNOSIS — C44612 Basal cell carcinoma of skin of right upper limb, including shoulder: Secondary | ICD-10-CM | POA: Diagnosis not present

## 2019-12-21 DIAGNOSIS — M1990 Unspecified osteoarthritis, unspecified site: Secondary | ICD-10-CM | POA: Diagnosis not present

## 2019-12-21 DIAGNOSIS — Z433 Encounter for attention to colostomy: Secondary | ICD-10-CM | POA: Diagnosis not present

## 2019-12-21 DIAGNOSIS — R1314 Dysphagia, pharyngoesophageal phase: Secondary | ICD-10-CM | POA: Diagnosis not present

## 2019-12-21 DIAGNOSIS — J90 Pleural effusion, not elsewhere classified: Secondary | ICD-10-CM | POA: Diagnosis not present

## 2019-12-21 DIAGNOSIS — H9193 Unspecified hearing loss, bilateral: Secondary | ICD-10-CM | POA: Diagnosis not present

## 2019-12-21 DIAGNOSIS — K7460 Unspecified cirrhosis of liver: Secondary | ICD-10-CM | POA: Diagnosis not present

## 2019-12-21 DIAGNOSIS — J9601 Acute respiratory failure with hypoxia: Secondary | ICD-10-CM | POA: Diagnosis not present

## 2019-12-21 DIAGNOSIS — E440 Moderate protein-calorie malnutrition: Secondary | ICD-10-CM | POA: Diagnosis not present

## 2019-12-21 DIAGNOSIS — E785 Hyperlipidemia, unspecified: Secondary | ICD-10-CM | POA: Diagnosis not present

## 2019-12-21 DIAGNOSIS — R188 Other ascites: Secondary | ICD-10-CM | POA: Diagnosis not present

## 2019-12-21 DIAGNOSIS — Z483 Aftercare following surgery for neoplasm: Secondary | ICD-10-CM | POA: Diagnosis not present

## 2019-12-21 DIAGNOSIS — I129 Hypertensive chronic kidney disease with stage 1 through stage 4 chronic kidney disease, or unspecified chronic kidney disease: Secondary | ICD-10-CM | POA: Diagnosis not present

## 2019-12-21 DIAGNOSIS — Z8572 Personal history of non-Hodgkin lymphomas: Secondary | ICD-10-CM | POA: Diagnosis not present

## 2019-12-21 DIAGNOSIS — G8929 Other chronic pain: Secondary | ICD-10-CM | POA: Diagnosis not present

## 2019-12-21 DIAGNOSIS — N183 Chronic kidney disease, stage 3 unspecified: Secondary | ICD-10-CM | POA: Diagnosis not present

## 2019-12-21 DIAGNOSIS — Z85038 Personal history of other malignant neoplasm of large intestine: Secondary | ICD-10-CM | POA: Diagnosis not present

## 2019-12-21 DIAGNOSIS — D61818 Other pancytopenia: Secondary | ICD-10-CM | POA: Diagnosis not present

## 2019-12-21 DIAGNOSIS — Z4801 Encounter for change or removal of surgical wound dressing: Secondary | ICD-10-CM | POA: Diagnosis not present

## 2019-12-22 ENCOUNTER — Inpatient Hospital Stay: Payer: Medicare Other | Attending: Oncology | Admitting: Oncology

## 2019-12-22 DIAGNOSIS — C8580 Other specified types of non-Hodgkin lymphoma, unspecified site: Secondary | ICD-10-CM | POA: Diagnosis not present

## 2019-12-22 NOTE — Progress Notes (Signed)
[place] OFFICE VISIT PROGRESS NOTE  I connected with Erica Escobar on 12/22/19 at 12:45 PM EST by  telephone and verified that I am speaking with the correct person using two identifiers.   I discussed the limitations, risks, security and privacy concerns of performing an evaluation and management service by telemedicine and the availability of in-person appointments. I also discussed with the patient that there may be a patient responsible charge related to this service. The patient expressed understanding and agreed to proceed.  She was discharged to a skilled nursing facility and has returned home.  Erica Escobar complains of chronic back and abdominal pain.  Her appetite is poor.  No fever or night sweats.  She recently had a "skin cancer "removed from the right shoulder.  Patient's location: Home Provider's location: Office   Diagnosis: Non-Hodgkin's lymphoma  INTERVAL HISTORY:   Erica Escobar is seen today for a telehealth visit.  This is secondary to the Covid pandemic. Erica Escobar was admitted on 10/12/2019 with respiratory failure.  She was diagnosed with liver cirrhosis and volume overload.  She is now followed by Dr. Deatra Ina.  She is maintained on Lasix and Aldactone.  She reports partial improvement in edema.     Lab Results:  Lab Results  Component Value Date   WBC 2.3 (L) 10/17/2019   HGB 10.5 (L) 10/17/2019   HCT 31.0 (L) 10/17/2019   MCV 100.3 (H) 10/17/2019   PLT 86 (L) 10/17/2019   NEUTROABS 1.4 (L) 10/15/2019     Medications: I have reviewed the patient's current medications.  Assessment/Plan: 1. 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 Loss of MLH1 and PMS2, MSI-high, BRAF mutation detected ? Colonoscopy February 2017 ? Colonoscopy 12/16/2017-3 mm polyp in the transverse colon, 10 mm polyp in the sigmoid colon. Diverticulosis  in the sigmoid colon. Transverse colon polyp, tubular adenoma; sigmoid colon polyp, tubular adenoma. ? CT 07/12/2018-probable subcutaneous metastasis at the left abdominal wall, left perinephric mass, destructive thoracic/lumbar paraspinous mass ? CT-guided biopsy of the paraspinous mass 07/13/2018  2. Anemia-likely secondary to bleeding from the colon cancer and surgery. Hemoglobin in normal range 03/20/2016, mild persistent anemia 3. Ascending colon "polyps" removed on the colonoscopy 12/22/2014 with the pathology revealing poorly differentiated adenocarcinoma and a sessile serrated adenoma polyp, similar to the pathology on the hepatic flexure mass 4. History of multiple skin cancers. Seen by dermatology every 3 months. 5. Glaucoma 6.Non-Hodgkin's lymphoma presenting with severe back pain  CTabdomen/pelvis7/30/2019-large destructive right paraspinal chest wall mass lower thoracic/upper lumbar spinewith associated bone destruction involving the right posterior elements of T9, T10 and T11, spinous processes of T8 and T9, and the posterior right 10th and 11th ribs. Probable subcutaneous metastasis anterior left abdominal wall 2.2 cm; enlarging mass in the left perinephric space 3.0 cm in size.  CT L-spine 07/12/2018-paraspinous soft tissue mass on the right at the lower thoracic spine causing destruction of the right T11 and 12 transverse processes; may encroach on the right T11 neural foramen.  CT-guided biopsy paraspinous mass 07/13/2018-poorly differentiated malignant neoplasm; CD45, CD20,bcl-6 andbcl-2positive. 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 FISH-BCL 6 rearrangement, no BCL-2 or MYC rearrangement  Radiationto paraspinous massinitiated 07/14/2018  Cycle 1 CVP/Rituxan 08/02/2018 08/29/2018-decreased size of right paraspinal soft tissue mass,  PET scan 08/07/538-JQBHALPFXT mild metabolic activity within the right paraspinal mass which is decreased  significantly in size from the comparison  exam. No evidence of lymphoma elsewhere. Focal activity within the distal esophagus above the GE junction without mass lesion or obstruction.  Cycle 2 CVP/Rituxan 08/22/2018,udenyca added  CT abdomen/pelvis-decrease in size of the right paraspinal soft tissue mass with adjacent lytic abnormality in the right posterior 10th and 11th ribs.  Cycle 3 CVP/Rituxan 09/12/2018-Cytoxan dose reduced  Cycle 4CVP/rituximab 10/03/2018  Cycle 5 CVP/rituximab 11/01/2018  Cycle 6 CVP/Rituxan 11/23/2018  CTs 12/23/2018- mildly reduced volume of abnormal soft tissue at the medial right 10th rib/paraspinal space, destructive changes at the medial right 10th rib and right 11th rib noted  CT abdomen/pelvis 10/12/2019-cirrhosis with ascites and portal hypertension, right pleural effusion, no lymphadenopathy 7.Anorexia/weight loss 8.Mild elevation of calcium-questionhypercalcemia of malignancy 9.History of severe neutropeniaand anemiasecondary to radiation and chemotherapy; udenyca added with cycle 2CVP/RituxanCytoxan dose reduced with cycle 3  10. Admission 08/29/2018 with failure to thrive and severe anemia/neutropenia, status post red cell transfusions on 08/30/2018 11.  Mild pancytopenia-likely secondary to cirrhosis 12.  Cirrhosis      Disposition: Erica Escobar is in clinical remission from 22 1.  She has been diagnosed with cirrhosis and associated volume overload.  She is followed by Dr. Benson Norway.  She was noted to have mild pancytopenia while hospitalized in October.  The cytopenias are likely related to cirrhosis.  There is probably a component related to the course of chemotherapy thousand 19.  She will be scheduled for an office visit here in 3 months.  She will contact us in the interim as needed.   I discussed the assessment and treatment plan with the patient. The patient was provided an opportunity to ask questions and all were answered. The  patient agreed with the plan and demonstrated an understanding of the instructions.   The patient was advised to call back or seek an in-person evaluation if the symptoms worsen or if the condition fails to improve as anticipated.  I provided 15 minutes of telephone, chart review, and documentation time during this encounter, and > 50% was spent counseling as documented under my assessment & plan.  Betsy Coder ANP/GNP-BC   12/22/2019 12:47 PM

## 2019-12-22 NOTE — Addendum Note (Signed)
Addended by: Betsy Coder B on: 12/22/2019 01:17 PM   Modules accepted: Orders

## 2019-12-25 ENCOUNTER — Telehealth: Payer: Self-pay | Admitting: Oncology

## 2019-12-25 DIAGNOSIS — D61818 Other pancytopenia: Secondary | ICD-10-CM | POA: Diagnosis not present

## 2019-12-25 DIAGNOSIS — R0602 Shortness of breath: Secondary | ICD-10-CM | POA: Diagnosis not present

## 2019-12-25 DIAGNOSIS — I10 Essential (primary) hypertension: Secondary | ICD-10-CM | POA: Diagnosis not present

## 2019-12-25 DIAGNOSIS — J309 Allergic rhinitis, unspecified: Secondary | ICD-10-CM | POA: Diagnosis not present

## 2019-12-25 DIAGNOSIS — Z9889 Other specified postprocedural states: Secondary | ICD-10-CM | POA: Diagnosis not present

## 2019-12-25 DIAGNOSIS — Z0001 Encounter for general adult medical examination with abnormal findings: Secondary | ICD-10-CM | POA: Diagnosis not present

## 2019-12-25 DIAGNOSIS — K7460 Unspecified cirrhosis of liver: Secondary | ICD-10-CM | POA: Diagnosis not present

## 2019-12-25 NOTE — Telephone Encounter (Signed)
Scheduled per los. Called and left msg. Mailed printout  °

## 2019-12-27 DIAGNOSIS — M1990 Unspecified osteoarthritis, unspecified site: Secondary | ICD-10-CM | POA: Diagnosis not present

## 2019-12-27 DIAGNOSIS — D61818 Other pancytopenia: Secondary | ICD-10-CM | POA: Diagnosis not present

## 2019-12-27 DIAGNOSIS — Z8572 Personal history of non-Hodgkin lymphomas: Secondary | ICD-10-CM | POA: Diagnosis not present

## 2019-12-27 DIAGNOSIS — Z85038 Personal history of other malignant neoplasm of large intestine: Secondary | ICD-10-CM | POA: Diagnosis not present

## 2019-12-27 DIAGNOSIS — Z433 Encounter for attention to colostomy: Secondary | ICD-10-CM | POA: Diagnosis not present

## 2019-12-27 DIAGNOSIS — I129 Hypertensive chronic kidney disease with stage 1 through stage 4 chronic kidney disease, or unspecified chronic kidney disease: Secondary | ICD-10-CM | POA: Diagnosis not present

## 2019-12-27 DIAGNOSIS — Z4801 Encounter for change or removal of surgical wound dressing: Secondary | ICD-10-CM | POA: Diagnosis not present

## 2019-12-27 DIAGNOSIS — G8929 Other chronic pain: Secondary | ICD-10-CM | POA: Diagnosis not present

## 2019-12-27 DIAGNOSIS — N183 Chronic kidney disease, stage 3 unspecified: Secondary | ICD-10-CM | POA: Diagnosis not present

## 2019-12-27 DIAGNOSIS — J9601 Acute respiratory failure with hypoxia: Secondary | ICD-10-CM | POA: Diagnosis not present

## 2019-12-27 DIAGNOSIS — K7460 Unspecified cirrhosis of liver: Secondary | ICD-10-CM | POA: Diagnosis not present

## 2019-12-27 DIAGNOSIS — H9193 Unspecified hearing loss, bilateral: Secondary | ICD-10-CM | POA: Diagnosis not present

## 2019-12-27 DIAGNOSIS — C44612 Basal cell carcinoma of skin of right upper limb, including shoulder: Secondary | ICD-10-CM | POA: Diagnosis not present

## 2019-12-27 DIAGNOSIS — R1314 Dysphagia, pharyngoesophageal phase: Secondary | ICD-10-CM | POA: Diagnosis not present

## 2019-12-27 DIAGNOSIS — M549 Dorsalgia, unspecified: Secondary | ICD-10-CM | POA: Diagnosis not present

## 2019-12-27 DIAGNOSIS — Z483 Aftercare following surgery for neoplasm: Secondary | ICD-10-CM | POA: Diagnosis not present

## 2019-12-27 DIAGNOSIS — R188 Other ascites: Secondary | ICD-10-CM | POA: Diagnosis not present

## 2019-12-27 DIAGNOSIS — E785 Hyperlipidemia, unspecified: Secondary | ICD-10-CM | POA: Diagnosis not present

## 2019-12-27 DIAGNOSIS — E440 Moderate protein-calorie malnutrition: Secondary | ICD-10-CM | POA: Diagnosis not present

## 2019-12-27 DIAGNOSIS — J90 Pleural effusion, not elsewhere classified: Secondary | ICD-10-CM | POA: Diagnosis not present

## 2019-12-28 ENCOUNTER — Telehealth: Payer: Self-pay | Admitting: Adult Health Nurse Practitioner

## 2019-12-28 NOTE — Telephone Encounter (Signed)
Spoke with patient's son Alveda Reasons regarding Palliative services and he requested that I call and speak with patient to schedule.  Son did state that he would like to be present as well.  I then call patient and explained why I was calling and she was in agreement with Palliative services.  I have scheduled an In-Person Consult for 01/05/20 @ 3:30 PM.  Called son back to let him know that date and time of the appointment.

## 2020-01-01 ENCOUNTER — Emergency Department (HOSPITAL_COMMUNITY): Payer: Medicare Other

## 2020-01-01 ENCOUNTER — Inpatient Hospital Stay (HOSPITAL_COMMUNITY)
Admission: EM | Admit: 2020-01-01 | Discharge: 2020-01-06 | DRG: 432 | Disposition: A | Discharge status: Home Health | Payer: Medicare Other | Attending: Internal Medicine | Admitting: Internal Medicine

## 2020-01-01 ENCOUNTER — Inpatient Hospital Stay (HOSPITAL_COMMUNITY): Payer: Medicare Other

## 2020-01-01 ENCOUNTER — Encounter (HOSPITAL_COMMUNITY): Payer: Self-pay | Admitting: Internal Medicine

## 2020-01-01 DIAGNOSIS — G2581 Restless legs syndrome: Secondary | ICD-10-CM | POA: Diagnosis not present

## 2020-01-01 DIAGNOSIS — H409 Unspecified glaucoma: Secondary | ICD-10-CM | POA: Diagnosis present

## 2020-01-01 DIAGNOSIS — J9621 Acute and chronic respiratory failure with hypoxia: Secondary | ICD-10-CM | POA: Diagnosis not present

## 2020-01-01 DIAGNOSIS — Z881 Allergy status to other antibiotic agents status: Secondary | ICD-10-CM

## 2020-01-01 DIAGNOSIS — Z8579 Personal history of other malignant neoplasms of lymphoid, hematopoietic and related tissues: Secondary | ICD-10-CM

## 2020-01-01 DIAGNOSIS — F329 Major depressive disorder, single episode, unspecified: Secondary | ICD-10-CM | POA: Diagnosis present

## 2020-01-01 DIAGNOSIS — Z87891 Personal history of nicotine dependence: Secondary | ICD-10-CM

## 2020-01-01 DIAGNOSIS — E785 Hyperlipidemia, unspecified: Secondary | ICD-10-CM | POA: Diagnosis not present

## 2020-01-01 DIAGNOSIS — R188 Other ascites: Secondary | ICD-10-CM | POA: Diagnosis present

## 2020-01-01 DIAGNOSIS — K219 Gastro-esophageal reflux disease without esophagitis: Secondary | ICD-10-CM | POA: Diagnosis present

## 2020-01-01 DIAGNOSIS — N183 Chronic kidney disease, stage 3 unspecified: Secondary | ICD-10-CM | POA: Diagnosis present

## 2020-01-01 DIAGNOSIS — K5909 Other constipation: Secondary | ICD-10-CM | POA: Diagnosis not present

## 2020-01-01 DIAGNOSIS — J90 Pleural effusion, not elsewhere classified: Secondary | ICD-10-CM

## 2020-01-01 DIAGNOSIS — D6959 Other secondary thrombocytopenia: Secondary | ICD-10-CM | POA: Diagnosis not present

## 2020-01-01 DIAGNOSIS — J948 Other specified pleural conditions: Secondary | ICD-10-CM | POA: Diagnosis present

## 2020-01-01 DIAGNOSIS — E1122 Type 2 diabetes mellitus with diabetic chronic kidney disease: Secondary | ICD-10-CM | POA: Diagnosis present

## 2020-01-01 DIAGNOSIS — R161 Splenomegaly, not elsewhere classified: Secondary | ICD-10-CM | POA: Diagnosis not present

## 2020-01-01 DIAGNOSIS — J410 Simple chronic bronchitis: Secondary | ICD-10-CM | POA: Diagnosis not present

## 2020-01-01 DIAGNOSIS — J449 Chronic obstructive pulmonary disease, unspecified: Secondary | ICD-10-CM | POA: Diagnosis not present

## 2020-01-01 DIAGNOSIS — Z8249 Family history of ischemic heart disease and other diseases of the circulatory system: Secondary | ICD-10-CM

## 2020-01-01 DIAGNOSIS — G4700 Insomnia, unspecified: Secondary | ICD-10-CM | POA: Diagnosis not present

## 2020-01-01 DIAGNOSIS — Z833 Family history of diabetes mellitus: Secondary | ICD-10-CM

## 2020-01-01 DIAGNOSIS — H9190 Unspecified hearing loss, unspecified ear: Secondary | ICD-10-CM | POA: Diagnosis not present

## 2020-01-01 DIAGNOSIS — K769 Liver disease, unspecified: Secondary | ICD-10-CM

## 2020-01-01 DIAGNOSIS — H3530 Unspecified macular degeneration: Secondary | ICD-10-CM | POA: Diagnosis present

## 2020-01-01 DIAGNOSIS — Z832 Family history of diseases of the blood and blood-forming organs and certain disorders involving the immune mechanism: Secondary | ICD-10-CM

## 2020-01-01 DIAGNOSIS — Z8572 Personal history of non-Hodgkin lymphomas: Secondary | ICD-10-CM

## 2020-01-01 DIAGNOSIS — Z880 Allergy status to penicillin: Secondary | ICD-10-CM

## 2020-01-01 DIAGNOSIS — Z885 Allergy status to narcotic agent status: Secondary | ICD-10-CM

## 2020-01-01 DIAGNOSIS — K7460 Unspecified cirrhosis of liver: Secondary | ICD-10-CM

## 2020-01-01 DIAGNOSIS — K7469 Other cirrhosis of liver: Principal | ICD-10-CM | POA: Diagnosis present

## 2020-01-01 DIAGNOSIS — Z888 Allergy status to other drugs, medicaments and biological substances status: Secondary | ICD-10-CM

## 2020-01-01 DIAGNOSIS — I129 Hypertensive chronic kidney disease with stage 1 through stage 4 chronic kidney disease, or unspecified chronic kidney disease: Secondary | ICD-10-CM | POA: Diagnosis present

## 2020-01-01 DIAGNOSIS — R0602 Shortness of breath: Secondary | ICD-10-CM | POA: Diagnosis not present

## 2020-01-01 DIAGNOSIS — Z9842 Cataract extraction status, left eye: Secondary | ICD-10-CM

## 2020-01-01 DIAGNOSIS — R0689 Other abnormalities of breathing: Secondary | ICD-10-CM | POA: Diagnosis not present

## 2020-01-01 DIAGNOSIS — Z8709 Personal history of other diseases of the respiratory system: Secondary | ICD-10-CM

## 2020-01-01 DIAGNOSIS — I9589 Other hypotension: Secondary | ICD-10-CM | POA: Diagnosis not present

## 2020-01-01 DIAGNOSIS — Z20822 Contact with and (suspected) exposure to covid-19: Secondary | ICD-10-CM | POA: Diagnosis not present

## 2020-01-01 DIAGNOSIS — R000 Tachycardia, unspecified: Secondary | ICD-10-CM | POA: Diagnosis not present

## 2020-01-01 DIAGNOSIS — E1140 Type 2 diabetes mellitus with diabetic neuropathy, unspecified: Secondary | ICD-10-CM | POA: Diagnosis present

## 2020-01-01 DIAGNOSIS — J9601 Acute respiratory failure with hypoxia: Secondary | ICD-10-CM | POA: Diagnosis present

## 2020-01-01 DIAGNOSIS — N1831 Chronic kidney disease, stage 3a: Secondary | ICD-10-CM

## 2020-01-01 DIAGNOSIS — C182 Malignant neoplasm of ascending colon: Secondary | ICD-10-CM

## 2020-01-01 DIAGNOSIS — Z85828 Personal history of other malignant neoplasm of skin: Secondary | ICD-10-CM

## 2020-01-01 DIAGNOSIS — Z7982 Long term (current) use of aspirin: Secondary | ICD-10-CM

## 2020-01-01 DIAGNOSIS — R069 Unspecified abnormalities of breathing: Secondary | ICD-10-CM | POA: Diagnosis not present

## 2020-01-01 DIAGNOSIS — Z743 Need for continuous supervision: Secondary | ICD-10-CM | POA: Diagnosis not present

## 2020-01-01 DIAGNOSIS — Z9841 Cataract extraction status, right eye: Secondary | ICD-10-CM

## 2020-01-01 DIAGNOSIS — Z66 Do not resuscitate: Secondary | ICD-10-CM | POA: Diagnosis present

## 2020-01-01 DIAGNOSIS — Z981 Arthrodesis status: Secondary | ICD-10-CM

## 2020-01-01 DIAGNOSIS — R609 Edema, unspecified: Secondary | ICD-10-CM | POA: Diagnosis not present

## 2020-01-01 DIAGNOSIS — E8770 Fluid overload, unspecified: Secondary | ICD-10-CM | POA: Diagnosis not present

## 2020-01-01 DIAGNOSIS — R748 Abnormal levels of other serum enzymes: Secondary | ICD-10-CM

## 2020-01-01 DIAGNOSIS — J918 Pleural effusion in other conditions classified elsewhere: Secondary | ICD-10-CM | POA: Diagnosis not present

## 2020-01-01 DIAGNOSIS — Z85038 Personal history of other malignant neoplasm of large intestine: Secondary | ICD-10-CM

## 2020-01-01 DIAGNOSIS — Z9889 Other specified postprocedural states: Secondary | ICD-10-CM

## 2020-01-01 DIAGNOSIS — Z90710 Acquired absence of both cervix and uterus: Secondary | ICD-10-CM

## 2020-01-01 DIAGNOSIS — Z9049 Acquired absence of other specified parts of digestive tract: Secondary | ICD-10-CM

## 2020-01-01 DIAGNOSIS — Z79899 Other long term (current) drug therapy: Secondary | ICD-10-CM

## 2020-01-01 DIAGNOSIS — R601 Generalized edema: Secondary | ICD-10-CM

## 2020-01-01 LAB — PROTEIN, PLEURAL OR PERITONEAL FLUID: Total protein, fluid: <3 g/dL

## 2020-01-01 LAB — AMYLASE, PLEURAL OR PERITONEAL FLUID: Amylase, Fluid: 78 U/L

## 2020-01-01 LAB — CBC WITH DIFFERENTIAL/PLATELET
Abs Immature Granulocytes: 0 10*3/uL (ref 0.00–0.07)
Basophils Absolute: 0 10*3/uL (ref 0.0–0.1)
Basophils Relative: 0 %
Eosinophils Absolute: 0 10*3/uL (ref 0.0–0.5)
Eosinophils Relative: 0 %
HCT: 38.1 % (ref 36.0–46.0)
Hemoglobin: 12.7 g/dL (ref 12.0–15.0)
Immature Granulocytes: 0 %
Lymphocytes Relative: 26 %
Lymphs Abs: 1.2 10*3/uL (ref 0.7–4.0)
MCH: 33.6 pg (ref 26.0–34.0)
MCHC: 33.3 g/dL (ref 30.0–36.0)
MCV: 100.8 fL — ABNORMAL HIGH (ref 80.0–100.0)
Monocytes Absolute: 0.6 10*3/uL (ref 0.1–1.0)
Monocytes Relative: 12 %
Neutro Abs: 2.8 10*3/uL (ref 1.7–7.7)
Neutrophils Relative %: 62 %
Platelets: 122 10*3/uL — ABNORMAL LOW (ref 150–400)
RBC: 3.78 MIL/uL — ABNORMAL LOW (ref 3.87–5.11)
RDW: 14.4 % (ref 11.5–15.5)
WBC: 4.6 10*3/uL (ref 4.0–10.5)
nRBC: 0 % (ref 0.0–0.2)

## 2020-01-01 LAB — RESPIRATORY PANEL BY RT PCR (FLU A&B, COVID)
Influenza A by PCR: NEGATIVE
Influenza B by PCR: NEGATIVE
SARS Coronavirus 2 by RT PCR: NEGATIVE

## 2020-01-01 LAB — GLUCOSE, PLEURAL OR PERITONEAL FLUID: Glucose, Fluid: 123 mg/dL

## 2020-01-01 LAB — URINALYSIS, ROUTINE W REFLEX MICROSCOPIC
Bilirubin Urine: NEGATIVE
Glucose, UA: NEGATIVE mg/dL
Hgb urine dipstick: NEGATIVE
Ketones, ur: NEGATIVE mg/dL
Leukocytes,Ua: NEGATIVE
Nitrite: NEGATIVE
Protein, ur: NEGATIVE mg/dL
Specific Gravity, Urine: 1.008 (ref 1.005–1.030)
pH: 5 (ref 5.0–8.0)

## 2020-01-01 LAB — LACTATE DEHYDROGENASE: LDH: 243 U/L — ABNORMAL HIGH (ref 98–192)

## 2020-01-01 LAB — COMPREHENSIVE METABOLIC PANEL
ALT: 37 U/L (ref 0–44)
AST: 59 U/L — ABNORMAL HIGH (ref 15–41)
Albumin: 3.2 g/dL — ABNORMAL LOW (ref 3.5–5.0)
Alkaline Phosphatase: 323 U/L — ABNORMAL HIGH (ref 38–126)
Anion gap: 12 (ref 5–15)
BUN: 15 mg/dL (ref 8–23)
CO2: 23 mmol/L (ref 22–32)
Calcium: 8.6 mg/dL — ABNORMAL LOW (ref 8.9–10.3)
Chloride: 105 mmol/L (ref 98–111)
Creatinine, Ser: 1.03 mg/dL — ABNORMAL HIGH (ref 0.44–1.00)
GFR calc Af Amer: 59 mL/min — ABNORMAL LOW (ref >60)
GFR calc non Af Amer: 51 mL/min — ABNORMAL LOW (ref >60)
Glucose, Bld: 126 mg/dL — ABNORMAL HIGH (ref 70–99)
Potassium: 3.8 mmol/L (ref 3.5–5.1)
Sodium: 140 mmol/L (ref 135–145)
Total Bilirubin: 2.1 mg/dL — ABNORMAL HIGH (ref 0.3–1.2)
Total Protein: 5.7 g/dL — ABNORMAL LOW (ref 6.5–8.1)

## 2020-01-01 LAB — LIPASE, BLOOD: Lipase: 89 U/L — ABNORMAL HIGH (ref 11–51)

## 2020-01-01 LAB — BODY FLUID CELL COUNT WITH DIFFERENTIAL
Eos, Fluid: 0 %
Lymphs, Fluid: 40 %
Monocyte-Macrophage-Serous Fluid: 51 % (ref 50–90)
Neutrophil Count, Fluid: 9 % (ref 0–25)
Total Nucleated Cell Count, Fluid: 128 cu mm (ref 0–1000)

## 2020-01-01 LAB — TROPONIN I (HIGH SENSITIVITY): Troponin I (High Sensitivity): 6 ng/L (ref <18)

## 2020-01-01 LAB — PROTIME-INR
INR: 1 (ref 0.8–1.2)
Prothrombin Time: 13.2 seconds (ref 11.4–15.2)

## 2020-01-01 LAB — LACTATE DEHYDROGENASE, PLEURAL OR PERITONEAL FLUID: LD, Fluid: 46 U/L — ABNORMAL HIGH (ref 3–23)

## 2020-01-01 LAB — BRAIN NATRIURETIC PEPTIDE: B Natriuretic Peptide: 74.7 pg/mL (ref 0.0–100.0)

## 2020-01-01 MED ORDER — ALBUTEROL SULFATE (2.5 MG/3ML) 0.083% IN NEBU: 2.5000 mg | INHALATION_SOLUTION | RESPIRATORY_TRACT | Status: DC | PRN

## 2020-01-01 MED ORDER — POLYVINYL ALCOHOL 1.4 % OP SOLN: 1.0000 [drp] | OPHTHALMIC | Status: DC | PRN

## 2020-01-01 MED ORDER — SUCRALFATE 1 G PO TABS
1.0000 g | ORAL_TABLET | Freq: Three times a day (TID) | ORAL | Status: DC
  Administered 2020-01-01 – 2020-01-06 (×18): 1 g via ORAL
  Filled 2020-01-01 (×18): qty 1

## 2020-01-01 MED ORDER — ONDANSETRON HCL 4 MG PO TABS: 4.0000 mg | ORAL_TABLET | Freq: Four times a day (QID) | ORAL | Status: DC | PRN

## 2020-01-01 MED ORDER — ENOXAPARIN SODIUM 40 MG/0.4ML ~~LOC~~ SOLN
40.0000 mg | SUBCUTANEOUS | Status: DC
  Administered 2020-01-01 – 2020-01-05 (×5): 40 mg via SUBCUTANEOUS
  Filled 2020-01-01 (×5): qty 0.4

## 2020-01-01 MED ORDER — POLYVINYL ALCOHOL-POVIDONE PF 1.4-0.6 % OP SOLN: 1.0000 [drp] | Freq: Three times a day (TID) | OPHTHALMIC | Status: DC | PRN

## 2020-01-01 MED ORDER — ONDANSETRON HCL 4 MG/2ML IJ SOLN: 4.0000 mg | Freq: Four times a day (QID) | INTRAMUSCULAR | Status: DC | PRN

## 2020-01-01 MED ORDER — POLYETHYLENE GLYCOL 3350 17 G PO PACK: 17.0000 g | PACK | Freq: Every day | ORAL | Status: DC | PRN

## 2020-01-01 MED ORDER — FUROSEMIDE 10 MG/ML IJ SOLN
40.0000 mg | Freq: Once | INTRAMUSCULAR | Status: AC
  Administered 2020-01-01: 40 mg via INTRAVENOUS
  Filled 2020-01-01: qty 4

## 2020-01-01 MED ORDER — FLUDROCORTISONE ACETATE 0.1 MG PO TABS
200.0000 ug | ORAL_TABLET | Freq: Every day | ORAL | Status: DC
  Administered 2020-01-01 – 2020-01-06 (×6): 200 ug via ORAL
  Filled 2020-01-01 (×9): qty 2

## 2020-01-01 MED ORDER — VITAMIN B-12 1000 MCG PO TABS
1000.0000 ug | ORAL_TABLET | Freq: Every day | ORAL | Status: DC
  Administered 2020-01-02 – 2020-01-06 (×5): 1000 ug via ORAL
  Filled 2020-01-01 (×6): qty 1

## 2020-01-01 MED ORDER — SODIUM CHLORIDE 0.9% FLUSH
3.0000 mL | Freq: Two times a day (BID) | INTRAVENOUS | Status: DC
  Administered 2020-01-01 – 2020-01-06 (×10): 3 mL via INTRAVENOUS

## 2020-01-01 MED ORDER — LACTULOSE 10 GM/15ML PO SOLN: 20.0000 g | Freq: Every day | ORAL | Status: DC | PRN

## 2020-01-01 MED ORDER — MIDODRINE HCL 2.5 MG PO TABS
2.5000 mg | ORAL_TABLET | Freq: Two times a day (BID) | ORAL | Status: DC
  Administered 2020-01-01 – 2020-01-06 (×10): 2.5 mg via ORAL
  Filled 2020-01-01 (×14): qty 1

## 2020-01-01 MED ORDER — PANTOPRAZOLE SODIUM 40 MG PO TBEC
40.0000 mg | DELAYED_RELEASE_TABLET | Freq: Every day | ORAL | Status: DC
  Administered 2020-01-01 – 2020-01-06 (×6): 40 mg via ORAL
  Filled 2020-01-01 (×6): qty 1

## 2020-01-01 MED ORDER — FUROSEMIDE 20 MG PO TABS
20.0000 mg | ORAL_TABLET | Freq: Every day | ORAL | Status: DC
  Administered 2020-01-01 – 2020-01-02 (×2): 20 mg via ORAL
  Filled 2020-01-01 (×2): qty 1

## 2020-01-01 MED ORDER — SPIRONOLACTONE 25 MG PO TABS
25.0000 mg | ORAL_TABLET | Freq: Two times a day (BID) | ORAL | Status: DC
  Administered 2020-01-01 – 2020-01-02 (×2): 25 mg via ORAL
  Filled 2020-01-01 (×3): qty 1

## 2020-01-01 NOTE — Plan of Care (Signed)

## 2020-01-01 NOTE — ED Triage Notes (Signed)
Per EMS- Patient c/o SOB x 4 hours. Patient had expiratory wheezing right lung and diminished left lung.  EMS gave Albuterol and Atrovent 5 mg neb and Solumedrol 125 mg IV prior to arrival to the ED.

## 2020-01-01 NOTE — H&P (Addendum)
History and Physical    [name]:[id number] DOB: 1936/05/04 DOA: 01/01/2020  Referring MD/NP/PA:  Lennice Sites, MD  PCP: Deland Pretty, MD  Patient coming from: Skilled nursing facility via EMS  Chief Complaint: Shortness of breath.  I have personally briefly reviewed patient's old medical records in [place]   HPI: Erica Escobar is a 84 y.o. female with medical history significant of hypotention, hyperlipidemia, COPD, nonalcoholic liver cirrhosis, non-Hodgkin's lymphoma, colon cancer status post right colectomy in 02/2015, CKD stage III, anemia, and GERD.  She presents with complaints of shortness of breath.  History is difficult to obtain due to patient being significantly hard of hearing.  She had been complaining of shortness of breath with any kind of exertion along with abdominal distention and chest pain.  Other associated symptoms include a dry nonproductive cough and bilateral lower extremity edema..  She question if she had Covid due to all the home health people that come in and out of her home.  Of note she was only  taking furosemide and spironolactone only if systolic BP was greater than 110.  Her daughter-in-law helps arrange her pills and notes that she takes those medicines may be 3 times a week due to her issues with low blood pressures.  She is being followed by Dr. Benson Norway GI in the outpatient setting.  Last follow-up appointment was on January 6 and her next follow-up appointment is on February 3.  En route with EMS patient was noted to be hypoxic into the 70s on room air and improved after being placed on a nonrebreather.   Patient was given 125 mg of Solu-Medrol IV and breathing treatments prior to arrival.   ED Course: Upon admission to the emergency department patient afebrile, pulse 112-115, respirations 20-24, O2 saturations maintained on 3 L nasal cannula oxygen.  COVID-19 and influenza screening was negative.  Labs significant for creatinine 1.03,  alkaline phosphatase 323, lipase 89, AST 59, ALT 37, total bilirubin 2.1, BNP 74.7, troponin 6, and INR 1.  Chest x-ray revealed complete opacification of the right lung field.  Patient was given 40 mg of Lasix IV.  TRH called to admit.  Review of Systems  Constitutional: Positive for malaise/fatigue. Negative for fever.  HENT: Positive for hearing loss. Negative for nosebleeds.   Eyes: Negative for photophobia and pain.  Respiratory: Positive for shortness of breath.   Cardiovascular: Positive for chest pain and leg swelling.  Gastrointestinal: Positive for abdominal pain.  Genitourinary: Negative for hematuria and urgency.  Musculoskeletal: Negative for falls.  Skin: Negative for itching.  Neurological: Negative for focal weakness and loss of consciousness.  Psychiatric/Behavioral: Negative for hallucinations and substance abuse.    Past Medical History:  Diagnosis Date  . Abnormal tympanic membrane    right ear and mastoid problems  . Arthritis    oseoarthritis  . Cancer of ascending colon ([place]) 02/15/2015  . Constipation, chronic   . COPD (chronic obstructive pulmonary disease) (HCC)    mild COPD  . Depression   . Diverticulosis   . GERD (gastroesophageal reflux disease)   . Glaucoma    bilateral-sugery with laser, using eye drops  . Hemorrhoids    Internal  . History of hiatal hernia    dx. '87 Schatzki's ring  . Hyperlipidemia    good control  . Hypertension   . Insomnia    hx. of  . Keratosis, actinic   . Leg cramps   . Macular degeneration   .  Neuromuscular disorder (HCC)    neuropathy legs,   . On aspirin at home    chronic use  . PONV (postoperative nausea and vomiting)   . Restless leg syndrome   . Schatzki's ring 1987  . Skin cancer    multiple, "tx. area right anterior wrist"  . Urinary incontinence    occ, stress    Past Surgical History:  Procedure Laterality Date  . ABDOMINAL HYSTERECTOMY    . BLADDER REPAIR    . CARDIAC CATHETERIZATION  2007,  2009  . CATARACT EXTRACTION Bilateral   . CHOLECYSTECTOMY    . COLECTOMY    . FINGER NAIL SURGERY    . FOOT SURGERY Right   . HEMORROIDECTOMY    . INNER EAR SURGERY Right    3-4 times  . INTRAOPERATIVE ARTERIOGRAM     right arm  . KNEE SURGERY Right   . LEG SURGERY Bilateral    laser ablation  . MASTOID DEBRIDEMENT     x2  . SKIN CANCER EXCISION     right/left thumb nail, left face, right ear/neck, right leg  . TOE FUSION    . TONSILLECTOMY       reports that she has quit smoking. She has never used smokeless tobacco. She reports that she does not drink alcohol or use drugs.  Allergies  Allergen Reactions  . Azithromycin Itching  . Allopurinol Other (See Comments)    Unknown  . Cephalexin Other (See Comments)    Unknown  . Codeine Itching  . Gabapentin Swelling and Other (See Comments)    Throat and leg swelling  . Niaspan [Niacin] Other (See Comments)    Unknown  . Oxybutynin Other (See Comments)    "made me go crazy"-- had crazy dreams.   . Paxil [Paroxetine Hcl] Other (See Comments)    Unknown reaction  . Penicillins Itching and Other (See Comments)    Has patient had a PCN reaction causing immediate rash, facial/tongue/throat swelling, SOB or lightheadedness with hypotension: No Has patient had a PCN reaction causing severe rash involving mucus membranes or skin necrosis: Yes Has patient had a PCN reaction that required hospitalization: No Has patient had a PCN reaction occurring within the last 10 years: No If all of the above answers are "NO", then may proceed with Cephalosporin use.   Marland Kitchen Pentazocine Itching, Nausea Only and Other (See Comments)    Headache.   . Tizanidine Other (See Comments)    "made me crazy"  . Tramadol Other (See Comments)    Head feels "swimmy" and weakness  . Vicodin [Hydrocodone-Acetaminophen] Other (See Comments)    "made me go crazy"    Family History  Problem Relation Age of Onset  . Diabetes Mother   . Cancer Father   .  Cancer Brother   . Cancer Brother   . Clotting disorder Child   . Hypertension Other     Prior to Admission medications   Medication Sig Start Date End Date Taking? Authorizing Provider  betamethasone valerate ointment (VALISONE) 0.1 % Apply 1 application topically 2 (two) times daily as needed (Heat under breast).    [provider]  calcium-vitamin D (OSCAL WITH D) 250-125 MG-UNIT tablet Take 1 tablet by mouth daily.    [provider]  ENSURE (ENSURE) Take 237 mLs by mouth daily. VANILLA OR STRAWBERRY IF AVAILABLE PO DAILY     [provider]  fludrocortisone (FLORINEF) 0.1 MG tablet Take 200 mcg by mouth daily. 12/12/19   [provider]  furosemide (LASIX) 20 MG tablet Take 1 tablet (20 mg total) by mouth daily. Hold for supine SBP <110 . Hold potassium if holding Lasix 10/20/19   Guilford Shi, MD  gemfibrozil (LOPID) 600 MG tablet Take 600 mg by mouth every morning.    [provider]  loratadine (CLARITIN) 10 MG tablet Take 10 mg by mouth daily as needed for allergies.    [provider]  midodrine (PROAMATINE) 2.5 MG tablet Take 1 tablet (2.5 mg total) by mouth 2 (two) times daily with a meal. 10/19/19   Guilford Shi, MD  mirtazapine (REMERON) 7.5 MG tablet Take 7.5 mg by mouth daily. 11/23/19   [provider]  Multiple Vitamin (MULTIVITAMIN WITH MINERALS) TABS tablet Take 1 tablet by mouth daily.    [provider]  Omega-3 Fatty Acids (FISH OIL) 1000 MG CAPS Take 2,000 mg by mouth daily with breakfast.     [provider]  ondansetron (ZOFRAN) 4 MG tablet Take 1 tablet (4 mg total) by mouth every 8 (eight) hours as needed for nausea or vomiting. 08/24/19   Ladell Pier, MD  pantoprazole (PROTONIX) 40 MG tablet Take 40 mg by mouth daily.    [provider]  polyethylene glycol (MIRALAX / GLYCOLAX) packet Take 17 g by mouth daily as needed for moderate constipation.     [provider]  Polyvinyl Alcohol-Povidone PF (REFRESH) 1.4-0.6 % SOLN Place 1 drop into both eyes 3 (three) times daily as needed (dry eyes).     [provider]  potassium chloride (KLOR-CON) 20 MEQ packet Take 20 mEq by mouth daily. With lasix 10/19/19   Guilford Shi, MD  spironolactone (ALDACTONE) 25 MG tablet Take 1 tablet (25 mg total) by mouth 2 (two) times daily. Hold supine SBP <100 10/19/19   Guilford Shi, MD  sucralfate (CARAFATE) 1 g tablet Take 1 tablet (1 g total) by mouth 4 (four) times daily -  with meals and at bedtime for 7 days. 10/19/19 10/26/19  Guilford Shi, MD  vitamin B-12 (CYANOCOBALAMIN) 1000 MCG tablet Take 1,000 mcg by mouth daily.    [provider]  vitamin C (ASCORBIC ACID) 500 MG tablet Take 500 mg by mouth daily.    [provider]    Physical Exam:  Constitutional: Elderly female who appears to be in no acute distress at this time Vitals:   01/01/20 1037 01/01/20 1121 01/01/20 1123 01/01/20 1200  BP:    114/88  Pulse:  (!) 115 (!) 113   Resp:  20 (!) 24 (!) 21  Temp:    98.2 F (36.8 C)  TempSrc:    Oral  SpO2: 95% 95% 99%    Eyes: PERRL, lids and conjunctivae normal ENMT: Mucous membranes are moist. Posterior pharynx clear of any exudate or lesions.hard of hearing. Neck: normal, supple, no masses, no thyromegaly.  No JVD. Respiratory: Tachypneic with decreased aeration noted on the right lung field.  No significant wheezes at this time.  Patient currently on 2 L nasal cannula oxygen with O2 saturations maintained. Cardiovascular: Regular rate and rhythm, no murmurs / rubs / gallops.  +1 pitting bilateral lower extremity edema extremity edema. 2+ pedal pulses. No carotid bruits.  Abdomen: Mild abdominal distention with positive fluid wave appreciated. No masses palpated. No hepatosplenomegaly. Bowel sounds positive.  Musculoskeletal: no clubbing / cyanosis. No joint deformity upper and lower extremities. Good ROM, no  contractures. Normal muscle tone.  Skin: no rashes, lesions, ulcers. No  induration Neurologic: CN 2-12 grossly intact. Sensation intact, DTR normal. Strength 5/5 in all 4.  Psychiatric: Normal judgment and insight. Alert and oriented x 3. Normal mood.     Labs on Admission: I have personally reviewed following labs and imaging studies  CBC: Recent Labs  Lab 01/01/20 1141  WBC 4.6  NEUTROABS 2.8  HGB 12.7  HCT 38.1  MCV 100.8*  PLT 025*   Basic Metabolic Panel: Recent Labs  Lab 01/01/20 1141  NA 140  K 3.8  CL 105  CO2 23  GLUCOSE 126*  BUN 15  CREATININE 1.03*  CALCIUM 8.6*   GFR: CrCl cannot be calculated (Unknown ideal weight.). Liver Function Tests: Recent Labs  Lab 01/01/20 1141  AST 59*  ALT 37  ALKPHOS 323*  BILITOT 2.1*  PROT 5.7*  ALBUMIN 3.2*   Recent Labs  Lab 01/01/20 1141  LIPASE 89*   No results for input(s): AMMONIA in the last 168 hours. Coagulation Profile: Recent Labs  Lab 01/01/20 1141  INR 1.0   Cardiac Enzymes: No results for input(s): CKTOTAL, CKMB, CKMBINDEX, TROPONINI in the last 168 hours. BNP (last 3 results) No results for input(s): PROBNP in the last 8760 hours. HbA1C: No results for input(s): HGBA1C in the last 72 hours. CBG: No results for input(s): GLUCAP in the last 168 hours. Lipid Profile: No results for input(s): CHOL, HDL, LDLCALC, TRIG, CHOLHDL, LDLDIRECT in the last 72 hours. Thyroid Function Tests: No results for input(s): TSH, T4TOTAL, FREET4, T3FREE, THYROIDAB in the last 72 hours. Anemia Panel: No results for input(s): VITAMINB12, FOLATE, FERRITIN, TIBC, IRON, RETICCTPCT in the last 72 hours. Urine analysis:    Component Value Date/Time   COLORURINE YELLOW 08/28/2018 0753   APPEARANCEUR HAZY (A) 08/28/2018 0753   LABSPEC 1.016 08/28/2018 0753   PHURINE 5.0 08/28/2018 0753   GLUCOSEU NEGATIVE 08/28/2018 0753   HGBUR NEGATIVE 08/28/2018 0753   BILIRUBINUR NEGATIVE 08/28/2018 0753   KETONESUR  NEGATIVE 08/28/2018 0753   PROTEINUR NEGATIVE 08/28/2018 0753   UROBILINOGEN 0.2 03/10/2009 1608   NITRITE NEGATIVE 08/28/2018 0753   LEUKOCYTESUR NEGATIVE 08/28/2018 0753   Sepsis Labs: No results found for this or any previous visit (from the past 240 hour(s)).   Radiological Exams on Admission: DG Chest Portable 1 View  Result Date: 01/01/2020 CLINICAL DATA:  Shortness of breath for 4 hours. Wheezing in the right lung. EXAM: PORTABLE CHEST 1 VIEW COMPARISON:  PA and lateral chest 12/25/2019. FINDINGS: Small right pleural effusion seen on the prior examination has markedly increased. The right chest is now almost completely opacified. The left lung is expanded and clear. No left effusion. Heart size is normal. No acute or focal bony abnormality. IMPRESSION: Near complete whiteout of the right chest is due to a large pleural effusion and airspace disease. The patient had only a small left effusion on the comparison exam. Electronically Signed   By: Inge Rise M.D.   On: 01/01/2020 11:38    Chest x-ray: Independently reviewed.  Complete opacification of the right lung seen  Assessment/Plan Respiratory failure secondary to anasarca with recurrent large right-sided pleural effusion: Acute on chronic.  Patient found to have complete opacification of the right lung on chest x-ray.  O2 saturations noted to be as low as 70% requiring 3L nasal cannula oxygen to maintain O2 saturations.  Hospitalized for the same back in October 2020.  Suspect secondary to intermittent use of diuretics. -Admit to a telemetry bed -Continuous pulse oximetry with nasal cannula oxygen to  maintain O2 saturations greater than 92% -Strict I&Os and daily weights -2 g salt restriction  -Nutrition consult -Schedule Lasix 20 mg daily and spironolactone 50 mg (hold for maps less than 65) -Appreciate Dr. Valeta Harms of PCCM consulted for thoracentesis -Appreciate Dr. Benson Norway of GI consultative services, we will follow-up for  further recommendation.  Nonalcoholic cirrhosis with ascites: Patient had been started on furosemide and spironolactone, but was only taking it systolic blood pressures greater than 110.  Family notes patient may have been taking these medications 3 times a week. -Continue scheduled Lasix and spironolactone as tolerated  COPD: Patient without acute wheezing noted on physical exam at this time. -Albuterol nebulizer as needed  Hypotension: Blood pressures currently are stable. -Continue midodrine and fludrocortisone  Elevated lipase: Acute.  Lipase elevated to 89 on admission.  Abdomen is otherwise soft at this time. -Will check a abdominal ultrasound  History of non-Hodgkin's lymphoma and colon cancer: Patient status post robotic right colectomy in 2016.  She is followed by Dr. Benay Spice of oncology and her non-Hodgkin's lymphoma is thought to be in remission. -Continue outpatient follow-up with Dr. Benay Spice  Chronic kidney disease stage III: Stable. -Continue to monitor with diuresis  GERD -Continue Carafate and Protonix  Thrombocytopenia: Chronic.  Platelet count 122 which appears near patient's baseline.  Suspect likely related to patient's history of cirrhosis. -Continue to monitor  DNR: present on admission  DVT prophylaxis: Lovenox Code Status: DNR Family Communication: Discussed plan of care with the patient's daughter over the phone Disposition Plan: Possible discharge home in 2 to 3 days Consults called: Dr. Benson Norway GI Admission status: observation  Norval Morton MD [facility] Pager [phone]   If 7PM-7AM, please contact night-coverage [email or web address] Password [facility]  01/01/2020, 12:39 PM

## 2020-01-01 NOTE — ED Provider Notes (Signed)
[facility] DEPT Provider Note   CSN: [id number] Arrival date & time: 01/01/20  1026     History Chief Complaint  Patient presents with  . Shortness of Breath    Erica Escobar is a 84 y.o. female.  The history is provided by the patient.  Shortness of Breath Severity:  Moderate Onset quality:  Gradual Timing:  Constant Progression:  Worsening Chronicity:  Recurrent Context comment:  Hx of COPD, colon cancer, recent admisson for same. History is limited due to poor hearing and SOB.  Relieved by:  Rest Worsened by:  Exertion Associated symptoms: no abdominal pain, no chest pain, no cough, no ear pain, no fever, no rash, no sore throat and no vomiting   Risk factors comment:  Large cell lymphoma, colon cancer      Past Medical History:  Diagnosis Date  . Abnormal tympanic membrane    right ear and mastoid problems  . Arthritis    oseoarthritis  . Cancer of ascending colon ([place]) 02/15/2015  . Constipation, chronic   . COPD (chronic obstructive pulmonary disease) (HCC)    mild COPD  . Depression   . Diverticulosis   . GERD (gastroesophageal reflux disease)   . Glaucoma    bilateral-sugery with laser, using eye drops  . Hemorrhoids    Internal  . History of hiatal hernia    dx. '87 Schatzki's ring  . Hyperlipidemia    good control  . Hypertension   . Insomnia    hx. of  . Keratosis, actinic   . Leg cramps   . Macular degeneration   . Neuromuscular disorder (HCC)    neuropathy legs,   . On aspirin at home    chronic use  . PONV (postoperative nausea and vomiting)   . Restless leg syndrome   . Schatzki's ring 1987  . Skin cancer    multiple, "tx. area right anterior wrist"  . Urinary incontinence    occ, stress    Patient Active Problem List   Diagnosis Date Noted  . Acute respiratory failure with hypoxia ([place]) 10/12/2019  . Pleural effusion on right 10/12/2019  . 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  Pancytopenia ([place]) 10/12/2019  . Malnutrition of moderate degree 08/30/2018  . Weakness 08/30/2018  . Symptomatic anemia 08/29/2018  . Acute kidney injury ([place]) 08/14/2018  . Dehydration 08/14/2018  . FTT (failure to thrive) in adult 08/14/2018  . Pressure injury of skin 08/14/2018  . Acute back pain 07/23/2018  . CKD (chronic kidney disease), stage III 07/23/2018  . Anorexia 07/23/2018  . Hyperlipidemia 07/23/2018  . 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 Metastatic cancer ([place]) 07/12/2018  . Chronic back pain 07/12/2018  . Pathologic rib fracture, initial encounter 07/12/2018  . Chest wall mass 07/12/2018  . Cancer of ascending colon s/p robotic colectomy 02/15/2015 02/15/2015  . Restless leg syndrome   . Depression   . Insomnia   . COPD (chronic obstructive pulmonary disease) ([place])   . GERD (gastroesophageal reflux disease)     Past Surgical History:  Procedure Laterality Date  . ABDOMINAL HYSTERECTOMY    . BLADDER REPAIR    . CARDIAC CATHETERIZATION  2007, 2009  . CATARACT EXTRACTION Bilateral   . CHOLECYSTECTOMY    . COLECTOMY    . FINGER NAIL SURGERY    . FOOT SURGERY Right   . HEMORROIDECTOMY    . INNER EAR SURGERY Right    3-4 times  . INTRAOPERATIVE ARTERIOGRAM  right arm  . KNEE SURGERY Right   . LEG SURGERY Bilateral    laser ablation  . MASTOID DEBRIDEMENT     x2  . SKIN CANCER EXCISION     right/left thumb nail, left face, right ear/neck, right leg  . TOE FUSION    . TONSILLECTOMY       OB History   No obstetric history on file.     Family History  Problem Relation Age of Onset  . Diabetes Mother   . Cancer Father   . Cancer Brother   . Cancer Brother   . Clotting disorder Child   . Hypertension Other     Social History   Tobacco Use  . Smoking status: Former Research scientist (life sciences)  . Smokeless tobacco: Never Used  . Tobacco comment: short time x1 - during pregnancy  Substance Use Topics  . Alcohol use: No    Alcohol/week: 0.0 standard drinks    . Drug use: No    Home Medications Prior to Admission medications   Medication Sig Start Date End Date Taking? Authorizing Provider  betamethasone valerate ointment (VALISONE) 0.1 % Apply 1 application topically 2 (two) times daily as needed (Heat under breast).    [provider]  calcium-vitamin D (OSCAL WITH D) 250-125 MG-UNIT tablet Take 1 tablet by mouth daily.    [provider]  ENSURE (ENSURE) Take 237 mLs by mouth daily. VANILLA OR STRAWBERRY IF AVAILABLE PO DAILY     [provider]  furosemide (LASIX) 20 MG tablet Take 1 tablet (20 mg total) by mouth daily. Hold for supine SBP <110 . Hold potassium if holding Lasix 10/20/19   Guilford Shi, MD  gemfibrozil (LOPID) 600 MG tablet Take 600 mg by mouth every morning.    [provider]  loratadine (CLARITIN) 10 MG tablet Take 10 mg by mouth daily as needed for allergies.    [provider]  midodrine (PROAMATINE) 2.5 MG tablet Take 1 tablet (2.5 mg total) by mouth 2 (two) times daily with a meal. 10/19/19   Guilford Shi, MD  Multiple Vitamin (MULTIVITAMIN WITH MINERALS) TABS tablet Take 1 tablet by mouth daily.    [provider]  Omega-3 Fatty Acids (FISH OIL) 1000 MG CAPS Take 2,000 mg by mouth daily with breakfast.     [provider]  ondansetron (ZOFRAN) 4 MG tablet Take 1 tablet (4 mg total) by mouth every 8 (eight) hours as needed for nausea or vomiting. 08/24/19   Ladell Pier, MD  pantoprazole (PROTONIX) 40 MG tablet Take 40 mg by mouth daily.    [provider]  polyethylene glycol (MIRALAX / GLYCOLAX) packet Take 17 g by mouth daily as needed for moderate constipation.     [provider]  Polyvinyl Alcohol-Povidone PF (REFRESH) 1.4-0.6 % SOLN Place 1 drop into both eyes 3 (three) times daily as needed (dry eyes).     [provider]  potassium chloride (KLOR-CON) 20 MEQ packet Take 20 mEq by mouth daily. With lasix 10/19/19    Guilford Shi, MD  spironolactone (ALDACTONE) 25 MG tablet Take 1 tablet (25 mg total) by mouth 2 (two) times daily. Hold supine SBP <100 10/19/19   Guilford Shi, MD  sucralfate (CARAFATE) 1 g tablet Take 1 tablet (1 g total) by mouth 4 (four) times daily -  with meals and at bedtime for 7 days. 10/19/19 10/26/19  Guilford Shi, MD  vitamin B-12 (CYANOCOBALAMIN) 1000 MCG tablet Take 1,000 mcg by mouth daily.  [provider]  vitamin C (ASCORBIC ACID) 500 MG tablet Take 500 mg by mouth daily.    [provider]    Allergies    Azithromycin, Allopurinol, Cephalexin, Codeine, Gabapentin, Niaspan [niacin], Oxybutynin, Paxil [paroxetine hcl], Penicillins, Pentazocine, Tizanidine, Tramadol, and Vicodin [hydrocodone-acetaminophen]  Review of Systems   Review of Systems  Constitutional: Negative for chills and fever.  HENT: Negative for ear pain and sore throat.   Eyes: Negative for pain and visual disturbance.  Respiratory: Positive for shortness of breath. Negative for cough.   Cardiovascular: Negative for chest pain and palpitations.  Gastrointestinal: Positive for abdominal distention. Negative for abdominal pain and vomiting.  Genitourinary: Negative for dysuria and hematuria.  Musculoskeletal: Negative for arthralgias and back pain.  Skin: Negative for color change and rash.  Neurological: Negative for seizures and syncope.  All other systems reviewed and are negative.   Physical Exam Updated Vital Signs BP 114/88   Pulse (!) 113   Temp 98.2 F (36.8 C) (Oral)   Resp (!) 21   SpO2 99%   Physical Exam Vitals and nursing note reviewed.  Constitutional:      General: She is not in acute distress.    Appearance: She is well-developed. She is not ill-appearing.  HENT:     Head: Normocephalic and atraumatic.     Nose: Nose normal.     Mouth/Throat:     Mouth: Mucous membranes are moist.  Eyes:     Extraocular Movements: Extraocular movements  intact.     Conjunctiva/sclera: Conjunctivae normal.     Pupils: Pupils are equal, round, and reactive to light.  Cardiovascular:     Rate and Rhythm: Normal rate and regular rhythm.     Pulses: Normal pulses.     Heart sounds: Normal heart sounds. No murmur.  Pulmonary:     Effort: No respiratory distress.     Comments: Diminished breath sounds on the right, poor air movement on the left, increased work of breathing Abdominal:     General: There is distension.     Palpations: Abdomen is soft.     Tenderness: There is no abdominal tenderness.  Musculoskeletal:        General: No swelling or tenderness.     Cervical back: Normal range of motion and neck supple.     Right lower leg: Edema present.     Left lower leg: Edema present.  Skin:    General: Skin is warm and dry.  Neurological:     General: No focal deficit present.     Mental Status: She is alert.  Psychiatric:        Mood and Affect: Mood normal.     ED Results / Procedures / Treatments   Labs (all labs ordered are listed, but only abnormal results are displayed) Labs Reviewed  CBC WITH DIFFERENTIAL/PLATELET - Abnormal; Notable for the following components:      Result Value   RBC 3.78 (*)    MCV 100.8 (*)    Platelets 122 (*)    All other components within normal limits  COMPREHENSIVE METABOLIC PANEL - Abnormal; Notable for the following components:   Glucose, Bld 126 (*)    Creatinine, Ser 1.03 (*)    Calcium 8.6 (*)    Total Protein 5.7 (*)    Albumin 3.2 (*)    AST 59 (*)    Alkaline Phosphatase 323 (*)    Total Bilirubin 2.1 (*)    GFR calc non Af Wyvonnia Lora  51 (*)    GFR calc Af Amer 59 (*)    All other components within normal limits  LIPASE, BLOOD - Abnormal; Notable for the following components:   Lipase 89 (*)    All other components within normal limits  RESPIRATORY PANEL BY RT PCR (FLU A&B, COVID)  BODY FLUID CULTURE  PROTIME-INR  URINALYSIS, ROUTINE W REFLEX MICROSCOPIC  BRAIN NATRIURETIC  PEPTIDE  CYTOLOGY - NON PAP  TROPONIN I (HIGH SENSITIVITY)    EKG None  Radiology DG Chest Portable 1 View  Result Date: 01/01/2020 CLINICAL DATA:  Shortness of breath for 4 hours. Wheezing in the right lung. EXAM: PORTABLE CHEST 1 VIEW COMPARISON:  PA and lateral chest 12/25/2019. FINDINGS: Small right pleural effusion seen on the prior examination has markedly increased. The right chest is now almost completely opacified. The left lung is expanded and clear. No left effusion. Heart size is normal. No acute or focal bony abnormality. IMPRESSION: Near complete whiteout of the right chest is due to a large pleural effusion and airspace disease. The patient had only a small left effusion on the comparison exam. Electronically Signed   By: Inge Rise M.D.   On: 01/01/2020 11:38    Procedures .Critical Care Performed by: Lennice Sites, DO Authorized by: Lennice Sites, DO   Critical care provider statement:    Critical care time (minutes):  40   Critical care was necessary to treat or prevent imminent or life-threatening deterioration of the following conditions:  Respiratory failure   Critical care was time spent personally by me on the following activities:  Blood draw for specimens, development of treatment plan with patient or surrogate, discussions with primary provider, discussions with consultants, evaluation of patient's response to treatment, examination of patient, obtaining history from patient or surrogate, ordering and performing treatments and interventions, ordering and review of laboratory studies, ordering and review of radiographic studies, pulse oximetry, re-evaluation of patient's condition and review of old charts   I assumed direction of critical care for this patient from another provider in my specialty: no     (including critical care time)  Medications Ordered in ED Medications  furosemide (LASIX) injection 40 mg (40 mg Intravenous Given 01/01/20 1114)     ED Course  I have reviewed the triage vital signs and the nursing notes.  Pertinent labs & imaging results that were available during my care of the patient were reviewed by me and considered in my medical decision making (see chart for details).    MDM Rules/Calculators/A&P  Erica Escobar is an 84 year old female with history of lymphoma, colon cancer, COPD who presents to the ED with shortness of breath, abdominal distention.  Patient hypoxic to the 70s on room air that improved on 3 L of oxygen.  Mildly tachypneic and tachycardic.  Increased work of breathing.  Quick bedside ultrasound showed ascites.  Diminished breath sounds to the right lung.  Poor air movement in the left lung.  Recent admission for similar process.  Patient is difficult of hearing and it appears that she was diagnosed with possibly cirrhosis and had large-volume paracentesis during her hospital stay.  She today appears to be in respiratory failure likely to volume overload as well.  She has edema in her legs.  She has chronic kidney disease and states that she does not want dialysis.  However lab work today shows a creatinine of 1.  Liver enzymes overall mildly elevated.  No leukocytosis or anemia.  INR is normal.  Troponin normal.  Chest x-ray shows complete whiteout of right chest due to large pleural effusion.  Overall likely seems that patient is volume overloaded from multifactorial process.  She does have remote cancer history and this could be a malignant pleural effusion as well.  Talked with interventional radiology and they will perform IR thoracentesis today.  They do not perform both thoracentesis and paracentesis on the same day.  Patient was given Lasix.  To be admitted to medicine for further care.  Suspect that she will need thoracentesis, paracentesis and diuresis.  Possibly this is secondary to malignancy.  This appears to be a gradual process.  This chart was dictated using voice recognition software.   Despite best efforts to proofread,  errors can occur which can change the documentation meaning.    Final Clinical Impression(s) / ED Diagnoses Final diagnoses:  Volume overload  Pleural effusion  Acute respiratory failure with hypoxia [facility])    Rx / DC Orders ED Discharge Orders    None       Lennice Sites, DO 01/01/20 1233

## 2020-01-01 NOTE — Consult Note (Addendum)
NAME:  Erica Escobar, MRN:  [id number], DOB:  1936/11/06, LOS: 0 ADMISSION DATE:  01/01/2020, CONSULTATION DATE:  01/01/20 REFERRING MD:  Tamala Julian - [facility], CHIEF COMPLAINT:  SOB  Brief History   84 yo F with non-hodgkin's lymphoma found to have symptomatic R sided pleural effusion   History of present illness   84 yo F PMH color cancer s/p R colectomy, Non-hodgkin's lymphoma, COPD, CKD III, DM, GERD, Cirrhosis who presents to ED with SOB via EMS. EMS noted patient to be hypoxic SpO2 70s and was placed on NRB.Patient reported that SOB was gradual in onset and is worsened by exertion. She endorses associated loss of appetite which was noted earlier this month.  Of note, patient admitted 10/20-11/20 at which time she was found to have non-alcoholic cirrhosis and underwent a paracentesis.    In ED, patient SpO2 98-100% on 2LNC. CXR reveals R sided pleural effusion and PCCM is consulted for possible thoracentesis.   Influenza A/B, COVID-19 negative   Past Medical History  Arthritis COPD Cancer of ascending colon Depression GERD HLD HTN Insomnia  Cirrhosis of liver  CKD III  FTT Non-Hodgkin's Lymphoma  Chronic back pain RLS  Significant Hospital Events   01/01/20 admitted to hospitalist service. PCCM asked to evaluate in ED for pleural effusion   Consults:  PCCM  Procedures:  01/01/20 Right thoracentesis  Significant Diagnostic Tests:  01/01/20 CXR> R pleural effusion with near complete whiteout of R chest.   Micro Data:  01/01/20 Flu A/B > neg 01/01/20 COVID-19> neg   Antimicrobials:    Interim history/subjective:  Dyspneic in ED  Objective   Blood pressure (!) 158/97, pulse (!) 112, temperature 98.2 F (36.8 C), temperature source Oral, resp. rate (!) 22, SpO2 98 %.       No intake or output data in the 24 hours ending 01/01/20 1306 There were no vitals filed for this visit.  Examination: General: Elderly appearing adult F, reclined in ED stretcher with increased  RR HENT: NCAT anicteric sclera pink mm Lungs: Diminished R sided breath sounds. Bilateral wheezing  Cardiovascular: RRR s1s2 1+ radial pulses  Abdomen: soft round normoactive Extremities: BLE edema. Symmetrical bulk and tone Neuro: Hard of hearing. AAOx3, following commands. 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 Pulmonary will follow up pleural fluid labs and continue to follow this patient.  Best practice:  Diet: Fluid restricted  Pain/Anxiety/Delirium protocol (if indicated): na VAP protocol (if indicated): na DVT prophylaxis: lovenox  GI prophylaxis: na Glucose control: monitor Mobility: Recommend PT/OT Code Status: DNR Family Communication: Patient and patient's son, Carloyn Manner updated Disposition: Telemetry   Labs   CBC: Recent Labs  Lab 01/01/20 1141  WBC 4.6  NEUTROABS 2.8  HGB 12.7  HCT 38.1  MCV 100.8*  PLT 122*    Basic Metabolic Panel: Recent Labs  Lab 01/01/20 1141  NA 140  K 3.8  CL 105  CO2 23  GLUCOSE 126*  BUN 15  CREATININE 1.03*  CALCIUM 8.6*   GFR: CrCl cannot be calculated (Unknown ideal weight.). Recent Labs  Lab 01/01/20 1141  WBC 4.6    Liver Function Tests: Recent Labs  Lab 01/01/20 1141  AST 59*  ALT 37  ALKPHOS 323*  BILITOT 2.1*  PROT 5.7*  ALBUMIN 3.2*   Recent Labs  Lab 01/01/20 1141  LIPASE 89*   No results for input(s): AMMONIA in the last 168 hours.  ABG    Component Value Date/Time   TCO2  26 03/10/2009 1712     Coagulation Profile: Recent Labs  Lab 01/01/20 1141  INR 1.0    Cardiac Enzymes: No results for input(s): CKTOTAL, CKMB, CKMBINDEX, TROPONINI in the last 168 hours.  HbA1C: Hgb A1c MFr Bld  Date/Time Value Ref Range Status  02/11/2015 01:30 PM 5.1 4.8 - 5.6 % Final    Comment:    (NOTE)         Pre-diabetes: 5.7 - 6.4         Diabetes: >6.4         Glycemic control for adults with diabetes: <7.0   03/10/2009 11:59 PM  4.6 - 6.1 % Final   4.9 (NOTE)   The ADA recommends the following therapeutic goal for glycemic   control related to Hgb A1C measurement:   Goal of Therapy:   < 7.0% Hgb A1C   Reference: American Diabetes Association: Clinical Practice   Recommendations 2008, Diabetes Care,  2008, 31:(Suppl 1).    CBG: No results for input(s): GLUCAP in the last 168 hours.  Review of Systems:   Endorses SOB, dry cough, DOE Denies chest pain, palpitations, endorses BLE edema Endorses nausea, loss of appetite. Denies diarrhea, vomiting Denies sick contacts, unintentional weight loss, fevers, chills, nightsweats  Denies skin hair nail changes   Past Medical History  She,  has a past medical history of Abnormal tympanic membrane, Arthritis, Cancer of ascending colon ([place]) (02/15/2015), Constipation, chronic, COPD (chronic obstructive pulmonary disease) ([place]), Depression, Diverticulosis, GERD (gastroesophageal reflux disease), Glaucoma, Hemorrhoids, History of hiatal hernia, Hyperlipidemia, Hypertension, Insomnia, Keratosis, actinic, Leg cramps, Macular degeneration, Neuromuscular disorder ([place]), On aspirin at home, PONV (postoperative nausea and vomiting), Restless leg syndrome, Schatzki's ring (1987), Skin cancer, and Urinary incontinence.   Surgical History    Past Surgical History:  Procedure Laterality Date  . ABDOMINAL HYSTERECTOMY    . BLADDER REPAIR    . CARDIAC CATHETERIZATION  2007, 2009  . CATARACT EXTRACTION Bilateral   . CHOLECYSTECTOMY     . COLECTOMY    . FINGER NAIL SURGERY    . FOOT SURGERY Right   . HEMORROIDECTOMY    . INNER EAR SURGERY Right    3-4 times  . INTRAOPERATIVE ARTERIOGRAM     right arm  . KNEE SURGERY Right   . LEG SURGERY Bilateral    laser ablation  . MASTOID DEBRIDEMENT     x2  . SKIN CANCER EXCISION     right/left thumb nail, left face, right ear/neck, right leg  . TOE FUSION    . TONSILLECTOMY       Social History   reports that she has quit smoking. She has never used smokeless tobacco. She reports that she does not drink alcohol or use drugs.   Family History   Her family history includes Cancer in her brother, brother, and father; Clotting disorder in her child; Diabetes in her mother; Hypertension in an other family member.   Allergies Allergies  Allergen Reactions  .  Azithromycin Itching  . Allopurinol Other (See Comments)    Unknown  . Cephalexin Other (See Comments)    Unknown  . Codeine Itching  . Gabapentin Swelling and Other (See Comments)    Throat and leg swelling  . Niaspan [Niacin] Other (See Comments)    Unknown  . Oxybutynin Other (See Comments)    "made me go crazy"-- had crazy dreams.   . Paxil [Paroxetine Hcl] Other (See Comments)    Unknown reaction  . Penicillins Itching and Other (See Comments)    Has patient had a PCN reaction causing immediate rash, facial/tongue/throat swelling, SOB or lightheadedness with hypotension: No Has patient had a PCN reaction causing severe rash involving mucus membranes or skin necrosis: Yes Has patient had a PCN reaction that required hospitalization: No Has patient had a PCN reaction occurring within the last 10 years: No If all of the above answers are "NO", then may proceed with Cephalosporin use.   Marland Kitchen Pentazocine Itching, Nausea Only and Other (See Comments)    Headache.   . Tizanidine Other (See Comments)    "made me crazy"  . Tramadol Other (See Comments)    Head feels "swimmy" and weakness  . Vicodin  [Hydrocodone-Acetaminophen] Other (See Comments)    "made me go crazy"     Home Medications  Prior to Admission medications   Medication Sig Start Date End Date Taking? Authorizing Provider  calcium-vitamin D (OSCAL WITH D) 250-125 MG-UNIT tablet Take 1 tablet by mouth daily.   Yes [provider]  furosemide (LASIX) 20 MG tablet Take 1 tablet (20 mg total) by mouth daily. Hold for supine SBP <110 . Hold potassium if holding Lasix Patient taking differently: Take 20 mg by mouth daily as needed (If BP is over 110). Hold for supine SBP <110 . Hold potassium if holding Lasix 10/20/19  Yes Guilford Shi, MD  loratadine (CLARITIN) 10 MG tablet Take 10 mg by mouth daily as needed for allergies.   Yes [provider]  midodrine (PROAMATINE) 2.5 MG tablet Take 1 tablet (2.5 mg total) by mouth 2 (two) times daily with a meal. 10/19/19  Yes Guilford Shi, MD  Multiple Vitamin (MULTIVITAMIN WITH MINERALS) TABS tablet Take 1 tablet by mouth daily.   Yes [provider]  Omega-3 Fatty Acids (FISH OIL) 1000 MG CAPS Take 2,000 mg by mouth daily with breakfast.    Yes [provider]  pantoprazole (PROTONIX) 40 MG tablet Take 40 mg by mouth daily.   Yes [provider]  potassium chloride (KLOR-CON) 20 MEQ packet Take 20 mEq by mouth daily. With lasix Patient taking differently: Take 20 mEq by mouth daily as needed (only take if she takes lasix). With lasix 10/19/19  Yes Guilford Shi, MD  vitamin B-12 (CYANOCOBALAMIN) 1000 MCG tablet Take 1,000 mcg by mouth daily.   Yes [provider]  vitamin C (ASCORBIC ACID) 500 MG tablet Take 500 mg by mouth daily.   Yes [provider]  betamethasone valerate ointment (VALISONE) 0.1 % Apply 1 application topically 2 (two) times daily as needed (Heat under breast).    [provider]  ENSURE (ENSURE) Take 237 mLs by mouth daily. VANILLA OR STRAWBERRY IF AVAILABLE PO DAILY     [provider]  fludrocortisone (FLORINEF) 0.1 MG tablet Take 200 mcg by mouth daily. 12/12/19   [provider]  gemfibrozil (LOPID) 600 MG tablet Take 600 mg by mouth every morning.    [provider]  lactulose (CHRONULAC) 10 GM/15ML solution Take 20 g by mouth daily. 11/13/19   [provider]  mirtazapine (REMERON) 7.5 MG tablet Take 7.5 mg by mouth daily. 11/23/19   [provider]  ondansetron (ZOFRAN) 4 MG tablet Take 1 tablet (4 mg total) by mouth every 8 (eight) hours as needed for nausea or vomiting. 08/24/19   Ladell Pier, MD  polyethylene glycol (MIRALAX / Floria Raveling) packet Take 17 g by mouth daily as needed for moderate constipation.     [provider]  Polyvinyl Alcohol-Povidone PF (REFRESH) 1.4-0.6 % SOLN Place 1 drop into both eyes 3 (three) times daily as needed (dry eyes).     [provider]  spironolactone (ALDACTONE) 25 MG tablet Take 1 tablet (25 mg total) by mouth 2 (two) times daily. Hold supine SBP <100 10/19/19   Guilford Shi, MD  sucralfate (CARAFATE) 1 g tablet Take 1 tablet (1 g total) by mouth 4 (four) times daily -  with meals and at bedtime for 7 days. 10/19/19 10/26/19  Guilford Shi, MD        Eliseo Gum MSN, AGACNP-BC [place] [phone] If no answer, [phone] 01/01/2020, 1:06 PM   PCCM attending:  This is a 84 year old female with a past medical history of colon cancer status post right colectomy several years ago, history of non-Hodgkin's lymphoma little over a year ago now in remission, CKD 3, diabetes, gastroesophageal reflux, nonalcoholic cirrhosis.  This was diagnosed a little over a year ago during a hospital admission.  Has image findings concerning for cirrhosis.  Patient has also had a prior paracentesis.  Currently managed with Lasix as an outpatient.  Today presents to the ER with progressive weakness and shortness of breath.  Chest x-ray  reveals a large right-sided pleural effusion.  Ultrasound bedside reveals free-flowing a large right-sided pleural effusion.  Pulmonary was consulted for recommendations of right-sided pleural effusion as well as thoracentesis.  BP 125/77   Pulse (!) 113   Temp 98.2 F (36.8 C) (Oral)   Resp (!) 22   Ht 5' 1"  (1.549 m)   Wt 67.1 kg   SpO2 98%   BMI 27.96 kg/m   General: Elderly female resting in bed respiratory distress, tachypneic HEENT: Tracking appropriately, NCAT Neck: Trachea midline Lungs: Absent breath sounds on the right side as compared to the left Heart: Tachycardic, regular S1-S2 Abdomen: Mildly distended soft Extremities: Bilateral lower extremity edema  Labs: Reviewed Covid negative Flu negative  Assessment: Large right-sided free-flowing simple appearing pleural effusion. 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 History of COPD CKD 3  Plan: Bedside thoracentesis planned today.  At bedside we discussed risk benefits and alternatives of proceeding with pleural fluid analysis.  Son was also called by Eliseo Gum, NP to discuss these risk benefits and alternatives.  Patient also agrees and freely signed consent.  Please see separate procedure note. Sent for usual pleural fluid studies as well as cytology. We will also send for albumin gradient. Continue fluid management for volume overload and anasarca in the setting of cirrhosis per primary.  We did remove 2.4 L of fluid today.  If patient has any transient hypotension would consider giving 50 g bolus of albumin.  Prior to giving her any crystalloid.  We appreciate the consultation.  Pulmonary  will follow up pleural fluid studies.  Garner Nash, DO [place]  [facility] 01/01/2020 3:39 PM

## 2020-01-01 NOTE — Procedures (Signed)
Thoracentesis Procedure Note  Pre-operative Diagnosis: Right sided effusion   Post-operative Diagnosis: same  Indications: SOB, right sided effusion   Procedure Details  Consent: Informed consent was obtained. Risks of the procedure were discussed including: infection, bleeding, pain, pneumothorax.  Under sterile conditions the patient was positioned. Betadine solution and sterile drapes were utilized.  1% buffered lidocaine was used to anesthetize the approximate 8th posterior lateral rib space at the level of fluid identified by ultrasound guidance. Fluid was obtained without any difficulties and minimal blood loss.  A dressing was applied to the wound and wound care instructions were provided.   Findings 2400 ml of clear pleural fluid was obtained. A sample was sent to Pathology for cytogenetics, flow, and cell counts, as well as for infection analysis.  Complications:  None; patient tolerated the procedure well.          Condition: stable  Plan A follow up chest x-ray was ordered. Pending   Attending Attestation: I was present and scrubbed for the entire procedure.  Garner Nash, DO [place] [facility] 01/01/2020 3:25 PM

## 2020-01-02 ENCOUNTER — Inpatient Hospital Stay (HOSPITAL_COMMUNITY): Payer: Medicare Other

## 2020-01-02 ENCOUNTER — Other Ambulatory Visit: Payer: Self-pay

## 2020-01-02 DIAGNOSIS — R188 Other ascites: Secondary | ICD-10-CM

## 2020-01-02 DIAGNOSIS — Z66 Do not resuscitate: Secondary | ICD-10-CM

## 2020-01-02 LAB — COMPREHENSIVE METABOLIC PANEL
ALT: 35 U/L (ref 0–44)
AST: 41 U/L (ref 15–41)
Albumin: 2.9 g/dL — ABNORMAL LOW (ref 3.5–5.0)
Alkaline Phosphatase: 280 U/L — ABNORMAL HIGH (ref 38–126)
Anion gap: 8 (ref 5–15)
BUN: 16 mg/dL (ref 8–23)
CO2: 28 mmol/L (ref 22–32)
Calcium: 8.7 mg/dL — ABNORMAL LOW (ref 8.9–10.3)
Chloride: 104 mmol/L (ref 98–111)
Creatinine, Ser: 1 mg/dL (ref 0.44–1.00)
GFR calc Af Amer: >60 mL/min (ref >60)
GFR calc non Af Amer: 53 mL/min — ABNORMAL LOW (ref >60)
Glucose, Bld: 115 mg/dL — ABNORMAL HIGH (ref 70–99)
Potassium: 4.1 mmol/L (ref 3.5–5.1)
Sodium: 140 mmol/L (ref 135–145)
Total Bilirubin: 1.6 mg/dL — ABNORMAL HIGH (ref 0.3–1.2)
Total Protein: 5.1 g/dL — ABNORMAL LOW (ref 6.5–8.1)

## 2020-01-02 LAB — CBC
HCT: 32.5 % — ABNORMAL LOW (ref 36.0–46.0)
Hemoglobin: 11 g/dL — ABNORMAL LOW (ref 12.0–15.0)
MCH: 33.7 pg (ref 26.0–34.0)
MCHC: 33.8 g/dL (ref 30.0–36.0)
MCV: 99.7 fL (ref 80.0–100.0)
Platelets: 105 10*3/uL — ABNORMAL LOW (ref 150–400)
RBC: 3.26 MIL/uL — ABNORMAL LOW (ref 3.87–5.11)
RDW: 14.3 % (ref 11.5–15.5)
WBC: 5.1 10*3/uL (ref 4.0–10.5)
nRBC: 0 % (ref 0.0–0.2)

## 2020-01-02 LAB — ALBUMIN, FLUID (OTHER): Albumin, Body Fluid Other: 1.1 g/dL

## 2020-01-02 LAB — PH, BODY FLUID: pH, Body Fluid: 7.7

## 2020-01-02 LAB — TRIGLYCERIDES, BODY FLUIDS: Triglycerides, Fluid: 47 mg/dL

## 2020-01-02 MED ORDER — ENSURE ENLIVE PO LIQD
237.0000 mL | Freq: Two times a day (BID) | ORAL | Status: DC
  Administered 2020-01-02 – 2020-01-05 (×6): 237 mL via ORAL

## 2020-01-02 MED ORDER — SPIRONOLACTONE 100 MG PO TABS
100.0000 mg | ORAL_TABLET | Freq: Every day | ORAL | Status: DC
  Administered 2020-01-03 – 2020-01-06 (×4): 100 mg via ORAL
  Filled 2020-01-02 (×4): qty 1

## 2020-01-02 MED ORDER — FUROSEMIDE 40 MG PO TABS
40.0000 mg | ORAL_TABLET | Freq: Every day | ORAL | Status: DC
  Administered 2020-01-03 – 2020-01-06 (×4): 40 mg via ORAL
  Filled 2020-01-02 (×4): qty 1

## 2020-01-02 MED ORDER — MELATONIN 3 MG PO TABS
6.0000 mg | ORAL_TABLET | Freq: Every evening | ORAL | Status: DC | PRN
  Administered 2020-01-02 (×2): 6 mg via ORAL
  Filled 2020-01-02 (×2): qty 2

## 2020-01-02 NOTE — Progress Notes (Signed)
Reason for Consult: Decompensated cirrhosis Referring Physician: [facility]  Erica Escobar HPI: this is an 84 year old female with a PMH of non-alcoholic cirrhosis, non-Hodgkin's lympnoma, colon cancer s/p right hemicolectomy, and multiple other medical problems.  She was admitted for worsening SOB and increasing abdominal girth.  The patient was last evaluated in the office and she was relatively stable, but still fluid overloaded.  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  She is feeling better today.  When she was in the office a couple of weeks ago she was only taking furosemide 20 mg QD and spironolactone 25 mg QD.  She tended not to take the medication if her SBP was low.  Past Medical History:  Diagnosis Date  . Abnormal tympanic membrane    right ear and mastoid problems  . Arthritis    oseoarthritis  . Cancer of ascending colon ([place]) 02/15/2015  . Constipation, chronic   . COPD (chronic obstructive pulmonary disease) (HCC)    mild COPD  . Depression   . Diverticulosis   . GERD (gastroesophageal reflux disease)   . Glaucoma    bilateral-sugery with laser, using eye drops  . Hemorrhoids    Internal  . History of hiatal hernia    dx. '87 Schatzki's ring  . Hyperlipidemia    good control  . Hypertension   . Insomnia    hx. of  . Keratosis, actinic   . Leg cramps   . Macular degeneration   . Neuromuscular disorder (HCC)    neuropathy legs,   . On aspirin at home    chronic use  . PONV (postoperative nausea and vomiting)   . Restless leg syndrome   . Schatzki's ring 1987  . Skin cancer    multiple, "tx. area right anterior wrist"  . Urinary incontinence    occ, stress    Past Surgical History:  Procedure Laterality Date  . ABDOMINAL HYSTERECTOMY    . BLADDER REPAIR    . CARDIAC CATHETERIZATION  2007, 2009  . CATARACT EXTRACTION Bilateral   . CHOLECYSTECTOMY    . COLECTOMY    . FINGER NAIL  SURGERY    . FOOT SURGERY Right   . HEMORROIDECTOMY    . INNER EAR SURGERY Right    3-4 times  . INTRAOPERATIVE ARTERIOGRAM     right arm  . KNEE SURGERY Right   . LEG SURGERY Bilateral    laser ablation  . MASTOID DEBRIDEMENT     x2  . SKIN CANCER EXCISION     right/left thumb nail, left face, right ear/neck, right leg  . TOE FUSION    . TONSILLECTOMY      Family History  Problem Relation Age of Onset  . Diabetes Mother   . Cancer Father   . Cancer Brother   . Cancer Brother   . Clotting disorder Child   . Hypertension Other     Social History:  reports that she has quit smoking. She has never used smokeless tobacco. She reports that she does not drink alcohol or use drugs.  Allergies:  Allergies  Allergen Reactions  . Azithromycin Itching  . Allopurinol Other (See Comments)    Unknown  . Cephalexin Other (See Comments)    Unknown  . Codeine Itching  . Gabapentin Swelling and Other (See Comments)    Throat and leg swelling  . Niaspan [Niacin] Other (See Comments)    Unknown  .  Oxybutynin Other (See Comments)    "made me go crazy"-- had crazy dreams.   . Paxil [Paroxetine Hcl] Other (See Comments)    Unknown reaction  . Penicillins Itching and Other (See Comments)    Has patient had a PCN reaction causing immediate rash, facial/tongue/throat swelling, SOB or lightheadedness with hypotension: No Has patient had a PCN reaction causing severe rash involving mucus membranes or skin necrosis: Yes Has patient had a PCN reaction that required hospitalization: No Has patient had a PCN reaction occurring within the last 10 years: No If all of the above answers are "NO", then may proceed with Cephalosporin use.   Marland Kitchen Pentazocine Itching, Nausea Only and Other (See Comments)    Headache.   . Tizanidine Other (See Comments)    "made me crazy"  . Tramadol Other (See Comments)    Head feels "swimmy" and weakness  . Vicodin [Hydrocodone-Acetaminophen] Other (See Comments)     "made me go crazy"    Medications:  Scheduled: . enoxaparin (LOVENOX) injection  40 mg Subcutaneous Q24H  . feeding supplement (ENSURE ENLIVE)  237 mL Oral BID BM  . fludrocortisone  200 mcg Oral Daily  . [START ON 01/03/2020] furosemide  40 mg Oral Daily  . midodrine  2.5 mg Oral BID WC  . pantoprazole  40 mg Oral Daily  . sodium chloride flush  3 mL Intravenous Q12H  . [START ON 01/03/2020] spironolactone  100 mg Oral Daily  . sucralfate  1 g Oral TID WC & HS  . vitamin B-12  1,000 mcg Oral Daily   Continuous:   Results for orders placed or performed during the hospital encounter of 01/01/20 (from the past 24 hour(s))  Body fluid culture (includes gram stain)     Status: None (Preliminary result)   Collection Time: 01/01/20  3:40 PM   Specimen: Pleural Fluid  Result Value Ref Range   Specimen Description      FLUID PLEURAL Performed at [facility] [address], [place], [place] [address]    Special Requests      NONE Performed at [facility], [place] [address] [place], [place] [address]    Gram Stain NO WBC SEEN NO ORGANISMS SEEN     Culture      NO GROWTH < 12 HOURS Performed at [facility], [place] [address] [place], [place] [address]    Report Status PENDING   Body fluid cell count with differential     Status: Abnormal   Collection Time: 01/01/20  3:40 PM  Result Value Ref Range   Fluid Type-FCT PLEURAL    Color, Fluid RED (A) YELLOW   Appearance, Fluid TURBID (A) CLEAR   Total Nucleated Cell Count, Fluid 128 0 - 1,000 cu mm   Neutrophil Count, Fluid 9 0 - 25 %   Lymphs, Fluid 40 %   Monocyte-Macrophage-Serous Fluid 51 50 - 90 %   Eos, Fluid 0 %  Glucose, pleural or peritoneal fluid     Status: None   Collection Time: 01/01/20  3:40 PM  Result Value Ref Range   Glucose, Fluid 123 mg/dL   Fluid Type-FGLU PLEURAL   Protein, pleural or peritoneal fluid     Status: None   Collection Time: 01/01/20  3:40 PM  Result Value  Ref Range   Total protein, fluid <3.0 g/dL   Fluid Type-FTP PLEURAL   Lactate dehydrogenase (pleural or peritoneal fluid)     Status: Abnormal  Collection Time: 01/01/20  3:40 PM  Result Value Ref Range   LD, Fluid 46 (H) 3 - 23 U/L   Fluid Type-FLDH PLEURAL   Triglycerides, Body Fluid     Status: None   Collection Time: 01/01/20  3:40 PM  Result Value Ref Range   Triglycerides, Fluid 47 Not Estab. mg/dL   Fluid Type-FTRIG PLEURAL   Amylase, pleural or peritoneal fluid     Status: None   Collection Time: 01/01/20  3:40 PM  Result Value Ref Range   Amylase, Fluid 78 U/L   Fluid Type-FAMY PLEURAL   ALBUMIN, FLUID (Other)     Status: None   Collection Time: 01/01/20  3:40 PM  Result Value Ref Range   Source of Sample PLEURAL    Albumin, Body Fluid Other 1.1 Not Estab. g/dL  Lactate dehydrogenase     Status: Abnormal   Collection Time: 01/01/20  4:15 PM  Result Value Ref Range   LDH 243 (H) 98 - 192 U/L  Comprehensive metabolic panel     Status: Abnormal   Collection Time: 01/02/20  5:23 AM  Result Value Ref Range   Sodium 140 135 - 145 mmol/L   Potassium 4.1 3.5 - 5.1 mmol/L   Chloride 104 98 - 111 mmol/L   CO2 28 22 - 32 mmol/L   Glucose, Bld 115 (H) 70 - 99 mg/dL   BUN 16 8 - 23 mg/dL   Creatinine, Ser 1.00 0.44 - 1.00 mg/dL   Calcium 8.7 (L) 8.9 - 10.3 mg/dL   Total Protein 5.1 (L) 6.5 - 8.1 g/dL   Albumin 2.9 (L) 3.5 - 5.0 g/dL   AST 41 15 - 41 U/L   ALT 35 0 - 44 U/L   Alkaline Phosphatase 280 (H) 38 - 126 U/L   Total Bilirubin 1.6 (H) 0.3 - 1.2 mg/dL   GFR calc non Af Amer 53 (L) >60 mL/min   GFR calc Af Amer >60 >60 mL/min   Anion gap 8 5 - 15  CBC     Status: Abnormal   Collection Time: 01/02/20  5:23 AM  Result Value Ref Range   WBC 5.1 4.0 - 10.5 K/uL   RBC 3.26 (L) 3.87 - 5.11 MIL/uL   Hemoglobin 11.0 (L) 12.0 - 15.0 g/dL   HCT 32.5 (L) 36.0 - 46.0 %   MCV 99.7 80.0 - 100.0 fL   MCH 33.7 26.0 - 34.0 pg   MCHC 33.8 30.0 - 36.0 g/dL   RDW 14.3 11.5 -  15.5 %   Platelets 105 (L) 150 - 400 K/uL   nRBC 0.0 0.0 - 0.2 %     US Abdomen Complete  Result Date: 01/02/2020 CLINICAL DATA:  Elevated lipase. EXAM: ABDOMEN ULTRASOUND COMPLETE COMPARISON:  Paracentesis ultrasound 10/13/2019. CT of the abdomen and pelvis with contrast 10/12/2019 FINDINGS: Gallbladder: Surgically absent Common bile duct: Diameter: 6.8 mm, within normal limits following cholecystectomy. Liver: Liver is diffusely echogenic. Nodular border is noted. No discrete lesions are present. Portal vein is patent on color Doppler imaging with normal direction of blood flow towards the liver. IVC: No abnormality visualized. Pancreas: Not well visualized due to overlying bowel gas. Spleen: Mildly enlarged at 14.0 cm maximally. No discrete lesions are present. Right Kidney: Length: 9.3 cm. Echogenicity within normal limits. No mass or hydronephrosis visualized. Left Kidney: Length: 10.3 cm. Echogenicity within normal limits. No mass or hydronephrosis visualized. Abdominal aorta: No aneurysm visualized. Other findings: Moderate diffuse abdominal ascites are present. IMPRESSION: 1.  Small echogenic liver with nodular border consistent with cirrhosis. 2. Splenomegaly 3. Moderate abdominal ascites. Electronically Signed   By: San Morelle M.D.   On: 01/02/2020 06:31   DG CHEST PORT 1 VIEW  Result Date: 01/02/2020 CLINICAL DATA:  Pleural effusion. EXAM: PORTABLE CHEST 1 VIEW COMPARISON:  One-view chest x-ray 01/01/2020 FINDINGS: The heart size is normal. Progressive right pleural effusion and airspace disease is present. The left lung is clear. Lung volumes are low. IMPRESSION: Progressive right pleural effusion and airspace disease. While this may represent atelectasis, infection is not excluded. Electronically Signed   By: San Morelle M.D.   On: 01/02/2020 06:08   DG CHEST PORT 1 VIEW  Result Date: 01/01/2020 CLINICAL DATA:  Shortness of breath for 4 hours. EXAM: PORTABLE CHEST 1 VIEW  COMPARISON:  January 01, 2020 FINDINGS: Skin folds are seen over the lateral left chest. A right-sided pleural effusion with underlying opacity remains but is smaller in the interval. The cardiomediastinal silhouette is stable. No pneumothorax. No other interval changes. IMPRESSION: The right-sided pleural effusion with underlying opacity persists but is smaller in the interval. No other acute interval changes. Electronically Signed   By: Dorise Bullion III M.D   On: 01/01/2020 15:53   DG Chest Portable 1 View  Result Date: 01/01/2020 CLINICAL DATA:  Shortness of breath for 4 hours. Wheezing in the right lung. EXAM: PORTABLE CHEST 1 VIEW COMPARISON:  PA and lateral chest 12/25/2019. FINDINGS: Small right pleural effusion seen on the prior examination has markedly increased. The right chest is now almost completely opacified. The left lung is expanded and clear. No left effusion. Heart size is normal. No acute or focal bony abnormality. IMPRESSION: Near complete whiteout of the right chest is due to a large pleural effusion and airspace disease. The patient had only a small left effusion on the comparison exam. Electronically Signed   By: Inge Rise M.D.   On: 01/01/2020 11:38    ROS:  As stated above in the HPI otherwise negative.  Blood pressure 122/76, pulse 81, temperature 98 F (36.7 C), resp. rate 18, height 5' 1"  (1.549 m), weight 67.1 kg, SpO2 100 %.    PE: Gen: NAD, Alert and Oriented HEENT:  [place]/AT, EOMI Neck: Supple, no LAD Lungs: dullness to percussion in the lower half of the right lung field CV: RRR without M/G/R ABM: Soft, NTND, +BS Ext: No C/C, 4+ pitting edema  Assessment/Plan: 1) Decompensated cirrhosis. 2) SOB - improved. 3) Hepatic hydrothorax. 4) Ascites.   It was difficult to manage the patient as an outpatient.  She stated that she did not take her diuretics on a daily basis and a daily weight was not available.  Being in the hospital will be easier to manage  her fluid overload.  Her diuretics will be increased to Step I dosing:  Lasix 40 mg QD and Spironolactone 100 mg QD.  Her UOP as well as as her electrolytes will be monitored closely.  The daily weights will be a reliable guide for her diuresis.  In her frail state she may not be able to tolerate higher dosing of diuretics, if needed.  Plan: 1) Step I diuretics. 2) Strict 2 gram sodium diet. 3) Follow daily weights. 4) Follow electrolytes and creatinine, daily.  Kieran Arreguin D 01/02/2020, 3:11 PM

## 2020-01-02 NOTE — Consult Note (Signed)
NAME:  Erica Escobar, MRN:  [id number], DOB:  July 05, 1936, LOS: 1 ADMISSION DATE:  01/01/2020, CONSULTATION DATE:  01/01/20 REFERRING MD:  Tamala Julian - [facility], CHIEF COMPLAINT:  SOB  Brief History   84 yo F with non-hodgkin's lymphoma found to have symptomatic R sided pleural effusion   History of present illness   84 yo F PMH color cancer s/p R colectomy, Non-hodgkin's lymphoma, COPD, CKD III, DM, GERD, Cirrhosis who presents to ED with SOB via EMS. EMS noted patient to be hypoxic SpO2 70s and was placed on NRB.Patient reported that SOB was gradual in onset and is worsened by exertion. She endorses associated loss of appetite which was noted earlier this month.  Of note, patient admitted 10/20-11/20 at which time she was found to have non-alcoholic cirrhosis and underwent a paracentesis.    In ED, patient SpO2 98-100% on 2LNC. CXR reveals R sided pleural effusion and PCCM is consulted for possible thoracentesis.   Influenza A/B, COVID-19 negative   Past Medical History  Arthritis COPD Cancer of ascending colon Depression GERD HLD HTN Insomnia  Cirrhosis of liver  CKD III  FTT Non-Hodgkin's Lymphoma  Chronic back pain RLS  Significant Hospital Events   01/01/20 admitted to hospitalist service. PCCM asked to evaluate in ED for pleural effusion   Consults:  PCCM  Procedures:  01/01/20 Right thoracentesis  Significant Diagnostic Tests:  01/01/20 CXR> R pleural effusion with near complete whiteout of R chest.   Micro Data:  01/01/20 Flu A/B > neg 01/01/20 COVID-19> neg   Antimicrobials:    Interim history/subjective:   Comfortable. No distress. Feels better than yesterday.   Objective   Blood pressure (!) 144/89, pulse 71, temperature 98.5 F (36.9 C), temperature source Oral, resp. rate 16, height 5' 1"  (1.549 m), weight 67.1 kg, SpO2 98 %.        Intake/Output Summary (Last 24 hours) at 01/02/2020 1005 Last data filed at 01/02/2020 0933 Gross per 24 hour  Intake 3 ml    Output 950 ml  Net -947 ml   Filed Weights   01/01/20 1312  Weight: 67.1 kg    Examination: General appearance: 84 y.o., female, NAD, conversant  Eyes: anicteric sclerae, tracking  HENT: NCAT; oropharynx, MMM Neck: Trachea midline Lungs: absent breath sounds in the right base  CV: RRR, S1, S2  Abdomen: Soft, non-tender; non-distended, BS present  Extremities: No peripheral edema, radial and DP pulses present bilaterally  Skin: Normal temperature, turgor and texture; no rash Psych: Appropriate affect Neuro: Alert and oriented to person and place, no focal deficit     Resolved Hospital Problem list     Assessment & Plan:   Transudative Right sided Pleural Effusion - likely hepatic hydrothorax due to underlying liver disease  - Non-alcoholic cirrhosis  P - s/p thora yesterday  - still with fluid reaccumulation  - unfortunately this is palliative management with repeat drainage as needed or continued fluid removal with diuretics  - I discussed possible pleur-ex/IPC with patient. She is not interested.  - I would suggest palliative care consultation and possible discharge with home hospice. - cytology still pending  Hx COPD Hx Non-Hodgkin's Lymphoma Hx Adenocarcinoma of ascending colon s/p R colectomy Hx cirrhosis  Hx CKD III P -per primary   Best practice:  Diet: Fluid restricted  Pain/Anxiety/Delirium protocol (if indicated): na VAP protocol (if indicated): na DVT prophylaxis: lovenox  GI prophylaxis: na Glucose control: monitor Mobility: Recommend PT/OT Code Status: DNR Family Communication: per primary  Disposition: Telemetry    Labs   CBC: Recent Labs  Lab 01/01/20 1141 01/02/20 0523  WBC 4.6 5.1  NEUTROABS 2.8  --   HGB 12.7 11.0*  HCT 38.1 32.5*  MCV 100.8* 99.7  PLT 122* 105*    Basic Metabolic Panel: Recent Labs  Lab 01/01/20 1141 01/02/20 0523  NA 140 140  K 3.8 4.1  CL 105 104  CO2 23 28  GLUCOSE 126* 115*  BUN 15 16   CREATININE 1.03* 1.00  CALCIUM 8.6* 8.7*   GFR: Estimated Creatinine Clearance: 38.7 mL/min (by C-G formula based on SCr of 1 mg/dL). Recent Labs  Lab 01/01/20 1141 01/02/20 0523  WBC 4.6 5.1    Liver Function Tests: Recent Labs  Lab 01/01/20 1141 01/02/20 0523  AST 59* 41  ALT 37 35  ALKPHOS 323* 280*  BILITOT 2.1* 1.6*  PROT 5.7* 5.1*  ALBUMIN 3.2* 2.9*   Recent Labs  Lab 01/01/20 1141  LIPASE 89*   No results for input(s): AMMONIA in the last 168 hours.  ABG    Component Value Date/Time   TCO2 26 03/10/2009 1712     Coagulation Profile: Recent Labs  Lab 01/01/20 1141  INR 1.0    Cardiac Enzymes: No results for input(s): CKTOTAL, CKMB, CKMBINDEX, TROPONINI in the last 168 hours.  HbA1C: Hgb A1c MFr Bld  Date/Time Value Ref Range Status  02/11/2015 01:30 PM 5.1 4.8 - 5.6 % Final    Comment:    (NOTE)         Pre-diabetes: 5.7 - 6.4         Diabetes: >6.4         Glycemic control for adults with diabetes: <7.0   03/10/2009 11:59 PM  4.6 - 6.1 % Final   4.9 (NOTE)   The ADA recommends the following therapeutic goal for glycemic   control related to Hgb A1C measurement:   Goal of Therapy:   < 7.0% Hgb A1C   Reference: American Diabetes Association: Clinical Practice   Recommendations 2008, Diabetes Care,  2008, 31:(Suppl 1).    CBG: No results for input(s): GLUCAP in the last 168 hours.  Garner Nash, DO [facility] 01/02/2020 10:05 AM

## 2020-01-02 NOTE — Progress Notes (Signed)
Initial Nutrition Assessment  INTERVENTION:   -Ensure Enlive po BID, each supplement provides 350 kcal and 20 grams of protein -Low Sodium diet information in discharge instructions  NUTRITION DIAGNOSIS:   Increased nutrient needs related to chronic illness, cancer and cancer related treatments(cirrhosis) as evidenced by estimated needs.  GOAL:   Patient will meet greater than or equal to 90% of their needs  MONITOR:   PO intake, Supplement acceptance, Labs, Weight trends, I & O's, GOC  REASON FOR ASSESSMENT:   Consult Diet education  ASSESSMENT:   84 yo F PMH color cancer s/p R colectomy, Non-hodgkin's lymphoma, COPD, CKD III, DM, GERD, Cirrhosis who presents to ED with SOB via EMS.  1/18: s/p thoracentesis, yield: 2.4L  RD consulted to provide low sodium diet education.  Per MD note, pt is severely HOH. Difficult to obtain history. Will place diet information in discharge instructions.  Pt with NHL, and has plans for palliative care meeting 1/22. Per chart review, pt has not been eating well. Pt drinks Ensure at home, will place order for BID.  Per weight records, no weight loss noted.   I/Os: -1.3L since admit UOP: 1350 ml x 24 hrs  Labs reviewed. Medications: Lasix tablet, Carafate tablet, Vitamin B-12  NUTRITION - FOCUSED PHYSICAL EXAM:  Deferred.  Diet Order:   Diet Order            Diet 2 gram sodium Room service appropriate? Yes; Fluid consistency: Thin  Diet effective now              EDUCATION NEEDS:   Not appropriate for education at this time  Skin:  Skin Assessment: Reviewed RN Assessment  Last BM:  1/19 -type 1  Height:   Ht Readings from Last 1 Encounters:  01/01/20 5' 1"  (1.549 m)    Weight:   Wt Readings from Last 1 Encounters:  01/01/20 67.1 kg    Ideal Body Weight:  47.7 kg  BMI:  Body mass index is 27.96 kg/m.  Estimated Nutritional Needs:   Kcal:  1700-1900  Protein:  80-90g  Fluid:  1.9L/day   Erica Bibles, MS, RD, LDN Inpatient Clinical Dietitian Pager: [phone] After Hours Pager: [phone]

## 2020-01-02 NOTE — Progress Notes (Signed)
PROGRESS NOTE    KADA FRIESEN  GUY:[id number] DOB: May 23, 1936 DOA: 01/01/2020 PCP: Deland Pretty, MD    Brief Narrative:  84 y.o. female with medical history significant of hypotention, hyperlipidemia, COPD, nonalcoholic liver cirrhosis, non-Hodgkin's lymphoma, colon cancer status post right colectomy in 02/2015, CKD stage III, anemia, and GERD.  She presents with complaints of shortness of breath.  History is difficult to obtain due to patient being significantly hard of hearing.  She had been complaining of shortness of breath with any kind of exertion along with abdominal distention and chest pain.  Other associated symptoms include a dry nonproductive cough and bilateral lower extremity edema..  She question if she had Covid due to all the home health people that come in and out of her home.  Of note she was only  taking furosemide and spironolactone only if systolic BP was greater than 110.  Her daughter-in-law helps arrange her pills and notes that she takes those medicines may be 3 times a week due to her issues with low blood pressures.  She is being followed by Dr. [facility] in the outpatient setting.  Last follow-up appointment was on January 6 and her next follow-up appointment is on February 3.  En route with EMS patient was noted to be hypoxic into the 70s on room air and improved after being placed on a nonrebreather.   Patient was given 125 mg of Solu-Medrol IV and breathing treatments prior to arrival.   ED Course: Upon admission to the emergency department patient afebrile, pulse 112-115, respirations 20-24, O2 saturations maintained on 3 L nasal cannula oxygen.  COVID-19 and influenza screening was negative.  Labs significant for creatinine 1.03, alkaline phosphatase 323, lipase 89, AST 59, ALT 37, total bilirubin 2.1, BNP 74.7, troponin 6, and INR 1.  Chest x-ray revealed complete opacification of the right lung field.  Patient was given 40 mg of Lasix IV.  TRH called to  admit.  Assessment & Plan:   Principal Problem:   Acute respiratory failure with hypoxia (HCC) Active Problems:   Cancer of ascending colon s/p robotic colectomy 02/15/2015   COPD (chronic obstructive pulmonary disease) (HCC)   GERD (gastroesophageal reflux disease)   CKD (chronic kidney disease), stage III   Cirrhosis of liver with ascites (HCC)   Anasarca   Recurrent right pleural effusion   DNR (do not resuscitate)  Respiratory failure secondary to anasarca with recurrent large right-sided pleural effusion: Acute on chronic.  Patient found to have complete opacification of the right lung on chest x-ray.  O2 saturations noted to be as low as 70% requiring 3L nasal cannula oxygen to maintain O2 saturations.  Hospitalized for the same back in October 2020.  Suspect secondary to intermittent use of diuretics. -Pt s/p thoracentesis by PCCM with clinical improvement -Currently on Lasix 20 mg daily and spironolactone 50 mg --GI consulted. Appreciate input. Recommendation to optimize diuretic regimen while in hospital. Lasix dose increased to 40m with spironolactone increased to 1050m  COPD:  -Patient without acute wheezing noted on physical exam at this time. -continue Albuterol nebulizer as needed -Currently on minimal o2 support  Hypotension: Blood pressures currently are stable. -Continue midodrine and fludrocortisone as tolerated  Elevated lipase: Acute.   -Lipase elevated to 89 on admission.   -Denies abd pain -Abd [place], findings of mod ascites with splenomegaly and small echogenic liver with nodular border consistent with cirrhosis  History of non-Hodgkin's lymphoma and colon cancer: Patient status post robotic right colectomy  in 2016.  She is followed by Dr. Benay Spice of oncology and her non-Hodgkin's lymphoma is thought to be in remission. -Continue outpatient follow-up with Dr. Benay Spice as outpatient  Chronic kidney disease stage III: Stable. -Continue to monitor  with diuresis -Repeat bmet in AM  GERD -Continue Carafate and Protonix as tolerated  Thrombocytopenia: Chronic.  Suspect likely related to patient's history of cirrhosis. -Plts down to 105 today -Repeat cbc in AM  DVT prophylaxis: Lovenox subq Code Status: DNR Family Communication: Pt in room, family not at bedside Disposition Plan: Plan home when cleared by GI  Consultants:   GI  PCCM  Procedures:   Thoracentesis 1/18  Antimicrobials: Anti-infectives (From admission, onward)   None       Subjective: Feeling better today  Objective: Vitals:   01/01/20 2043 01/02/20 0510 01/02/20 1236 01/02/20 1406  BP: (!) 149/82 (!) 144/89  122/76  Pulse: 80 71 73 81  Resp: 17 16 16 18   Temp: (!) 97.3 F (36.3 C) 98.5 F (36.9 C)  98 F (36.7 C)  TempSrc: Oral Oral    SpO2: 100% 98%  100%  Weight:      Height:        Intake/Output Summary (Last 24 hours) at 01/02/2020 1637 Last data filed at 01/02/2020 1602 Gross per 24 hour  Intake 303 ml  Output 1350 ml  Net -1047 ml   Filed Weights   01/01/20 1312  Weight: 67.1 kg    Examination:  General exam: Appears calm and comfortable  Respiratory system: Clear to auscultation. Respiratory effort normal. Cardiovascular system: S1 & S2 heard, Regular Gastrointestinal system: Abdomen is nondistended, soft and nontender. No organomegaly or masses felt. Normal bowel sounds heard. Central nervous system: Alert and oriented. No focal neurological deficits. Extremities: Symmetric 5 x 5 power. Skin: No rashes, lesions Psychiatry: Judgement and insight appear normal. Mood & affect appropriate.   Data Reviewed: I have personally reviewed following labs and imaging studies  CBC: Recent Labs  Lab 01/01/20 1141 01/02/20 0523  WBC 4.6 5.1  NEUTROABS 2.8  --   HGB 12.7 11.0*  HCT 38.1 32.5*  MCV 100.8* 99.7  PLT 122* 354*   Basic Metabolic Panel: Recent Labs  Lab 01/01/20 1141 01/02/20 0523  NA 140 140  K 3.8 4.1   CL 105 104  CO2 23 28  GLUCOSE 126* 115*  BUN 15 16  CREATININE 1.03* 1.00  CALCIUM 8.6* 8.7*   GFR: Estimated Creatinine Clearance: 38.7 mL/min (by C-G formula based on SCr of 1 mg/dL). Liver Function Tests: Recent Labs  Lab 01/01/20 1141 01/02/20 0523  AST 59* 41  ALT 37 35  ALKPHOS 323* 280*  BILITOT 2.1* 1.6*  PROT 5.7* 5.1*  ALBUMIN 3.2* 2.9*   Recent Labs  Lab 01/01/20 1141  LIPASE 89*   No results for input(s): AMMONIA in the last 168 hours. Coagulation Profile: Recent Labs  Lab 01/01/20 1141  INR 1.0   Cardiac Enzymes: No results for input(s): CKTOTAL, CKMB, CKMBINDEX, TROPONINI in the last 168 hours. BNP (last 3 results) No results for input(s): PROBNP in the last 8760 hours. HbA1C: No results for input(s): HGBA1C in the last 72 hours. CBG: No results for input(s): GLUCAP in the last 168 hours. Lipid Profile: No results for input(s): CHOL, HDL, LDLCALC, TRIG, CHOLHDL, LDLDIRECT in the last 72 hours. Thyroid Function Tests: No results for input(s): TSH, T4TOTAL, FREET4, T3FREE, THYROIDAB in the last 72 hours. Anemia Panel: No results for input(s): VITAMINB12, FOLATE,  FERRITIN, TIBC, IRON, RETICCTPCT in the last 72 hours. Sepsis Labs: No results for input(s): PROCALCITON, LATICACIDVEN in the last 168 hours.  Recent Results (from the past 240 hour(s))  Respiratory Panel by RT PCR (Flu A&B, Covid) - Nasopharyngeal Swab     Status: None   Collection Time: 01/01/20 11:18 AM   Specimen: Nasopharyngeal Swab  Result Value Ref Range Status   SARS Coronavirus 2 by RT PCR NEGATIVE NEGATIVE Final    Comment: (NOTE) SARS-CoV-2 target nucleic acids are NOT DETECTED. The SARS-CoV-2 RNA is generally detectable in upper respiratoy specimens during the acute phase of infection. The lowest concentration of SARS-CoV-2 viral copies this assay can detect is 131 copies/mL. A negative result does not preclude SARS-Cov-2 infection and should not be used as the sole  basis for treatment or other patient management decisions. A negative result may occur with  improper specimen collection/handling, submission of specimen other than nasopharyngeal swab, presence of viral mutation(s) within the areas targeted by this assay, and inadequate number of viral copies (<131 copies/mL). A negative result must be combined with clinical observations, patient history, and epidemiological information. The expected result is Negative. Fact Sheet for Patients:  PinkCheek.be Fact Sheet for Healthcare Providers:  GravelBags.it This test is not yet ap proved or cleared by the [place] FDA and  has been authorized for detection and/or diagnosis of SARS-CoV-2 by FDA under an Emergency Use Authorization (EUA). This EUA will remain  in effect (meaning this test can be used) for the duration of the COVID-19 declaration under Section 564(b)(1) of the Act, 21 U.S.C. section 360bbb-3(b)(1), unless the authorization is terminated or revoked sooner.    Influenza A by PCR NEGATIVE NEGATIVE Final   Influenza B by PCR NEGATIVE NEGATIVE Final    Comment: (NOTE) The Xpert Xpress SARS-CoV-2/FLU/RSV assay is intended as an aid in  the diagnosis of influenza from Nasopharyngeal swab specimens and  should not be used as a sole basis for treatment. Nasal washings and  aspirates are unacceptable for Xpert Xpress SARS-CoV-2/FLU/RSV  testing. Fact Sheet for Patients: PinkCheek.be Fact Sheet for Healthcare Providers: GravelBags.it This test is not yet approved or cleared by the [place] FDA and  has been authorized for detection and/or diagnosis of SARS-CoV-2 by  FDA under an Emergency Use Authorization (EUA). This EUA will remain  in effect (meaning this test can be used) for the duration of the  Covid-19 declaration under Section 564(b)(1) of the Act, 21  U.S.C.  section 360bbb-3(b)(1), unless the authorization is  terminated or revoked. Performed at [facility], [place] [address] [place], [place] [address]   Body fluid culture (includes gram stain)     Status: None (Preliminary result)   Collection Time: 01/01/20  3:40 PM   Specimen: Pleural Fluid  Result Value Ref Range Status   Specimen Description   Final    FLUID PLEURAL Performed at [facility], [place] [address], [place], [place] [address]    Special Requests   Final    NONE Performed at [facility], [place] [address] [place], [place] [address]    Gram Stain NO WBC SEEN NO ORGANISMS SEEN   Final   Culture   Final    NO GROWTH < 12 HOURS Performed at [facility], [place] [address], [place], [place] [address]    Report Status PENDING  Incomplete     Radiology Studies: US Abdomen Complete  Result Date: 01/02/2020 CLINICAL DATA:  Elevated lipase. EXAM: ABDOMEN ULTRASOUND COMPLETE COMPARISON:  Paracentesis ultrasound 10/13/2019. CT of the abdomen and pelvis with contrast 10/12/2019 FINDINGS: Gallbladder: Surgically absent Common bile duct: Diameter: 6.8 mm, within normal limits following cholecystectomy. Liver: Liver is diffusely echogenic. Nodular border is noted. No discrete lesions are present. Portal vein is patent on color Doppler imaging with normal direction of blood flow towards the liver. IVC: No abnormality visualized. Pancreas: Not well visualized due to overlying bowel gas. Spleen: Mildly enlarged at 14.0 cm maximally. No discrete lesions are present. Right Kidney: Length: 9.3 cm. Echogenicity within normal limits. No mass or hydronephrosis visualized. Left Kidney: Length: 10.3 cm. Echogenicity within normal limits. No mass or hydronephrosis visualized. Abdominal aorta: No aneurysm visualized. Other findings: Moderate diffuse abdominal ascites are present. IMPRESSION: 1. Small echogenic liver with nodular border consistent with cirrhosis.  2. Splenomegaly 3. Moderate abdominal ascites. Electronically Signed   By: San Morelle M.D.   On: 01/02/2020 06:31   DG CHEST PORT 1 VIEW  Result Date: 01/02/2020 CLINICAL DATA:  Pleural effusion. EXAM: PORTABLE CHEST 1 VIEW COMPARISON:  One-view chest x-ray 01/01/2020 FINDINGS: The heart size is normal. Progressive right pleural effusion and airspace disease is present. The left lung is clear. Lung volumes are low. IMPRESSION: Progressive right pleural effusion and airspace disease. While this may represent atelectasis, infection is not excluded. Electronically Signed   By: San Morelle M.D.   On: 01/02/2020 06:08   DG CHEST PORT 1 VIEW  Result Date: 01/01/2020 CLINICAL DATA:  Shortness of breath for 4 hours. EXAM: PORTABLE CHEST 1 VIEW COMPARISON:  January 01, 2020 FINDINGS: Skin folds are seen over the lateral left chest. A right-sided pleural effusion with underlying opacity remains but is smaller in the interval. The cardiomediastinal silhouette is stable. No pneumothorax. No other interval changes. IMPRESSION: The right-sided pleural effusion with underlying opacity persists but is smaller in the interval. No other acute interval changes. Electronically Signed   By: Dorise Bullion III M.D   On: 01/01/2020 15:53   DG Chest Portable 1 View  Result Date: 01/01/2020 CLINICAL DATA:  Shortness of breath for 4 hours. Wheezing in the right lung. EXAM: PORTABLE CHEST 1 VIEW COMPARISON:  PA and lateral chest 12/25/2019. FINDINGS: Small right pleural effusion seen on the prior examination has markedly increased. The right chest is now almost completely opacified. The left lung is expanded and clear. No left effusion. Heart size is normal. No acute or focal bony abnormality. IMPRESSION: Near complete whiteout of the right chest is due to a large pleural effusion and airspace disease. The patient had only a small left effusion on the comparison exam. Electronically Signed   By: Inge Rise M.D.   On: 01/01/2020 11:38    Scheduled Meds: . enoxaparin (LOVENOX) injection  40 mg Subcutaneous Q24H  . feeding supplement (ENSURE ENLIVE)  237 mL Oral BID BM  . fludrocortisone  200 mcg Oral Daily  . [START ON 01/03/2020] furosemide  40 mg Oral Daily  . midodrine  2.5 mg Oral BID WC  . pantoprazole  40 mg Oral Daily  . sodium chloride flush  3 mL Intravenous Q12H  . [START ON 01/03/2020] spironolactone  100 mg Oral Daily  . sucralfate  1 g Oral TID WC & HS  . vitamin B-12  1,000 mcg Oral Daily   Continuous Infusions:   LOS: 1 day   Marylu Lund, MD [facility] Pager On Amion  If 7PM-7AM, please contact night-coverage 01/02/2020, 4:37 PM

## 2020-01-02 NOTE — Discharge Instructions (Signed)

## 2020-01-02 NOTE — Plan of Care (Signed)

## 2020-01-03 LAB — COMPREHENSIVE METABOLIC PANEL
ALT: 37 U/L (ref 0–44)
AST: 44 U/L — ABNORMAL HIGH (ref 15–41)
Albumin: 2.8 g/dL — ABNORMAL LOW (ref 3.5–5.0)
Alkaline Phosphatase: 235 U/L — ABNORMAL HIGH (ref 38–126)
Anion gap: 8 (ref 5–15)
BUN: 25 mg/dL — ABNORMAL HIGH (ref 8–23)
CO2: 28 mmol/L (ref 22–32)
Calcium: 8.2 mg/dL — ABNORMAL LOW (ref 8.9–10.3)
Chloride: 103 mmol/L (ref 98–111)
Creatinine, Ser: 1.1 mg/dL — ABNORMAL HIGH (ref 0.44–1.00)
GFR calc Af Amer: 55 mL/min — ABNORMAL LOW (ref >60)
GFR calc non Af Amer: 47 mL/min — ABNORMAL LOW (ref >60)
Glucose, Bld: 97 mg/dL (ref 70–99)
Potassium: 3.4 mmol/L — ABNORMAL LOW (ref 3.5–5.1)
Sodium: 139 mmol/L (ref 135–145)
Total Bilirubin: 1.8 mg/dL — ABNORMAL HIGH (ref 0.3–1.2)
Total Protein: 4.8 g/dL — ABNORMAL LOW (ref 6.5–8.1)

## 2020-01-03 LAB — CYTOLOGY - NON PAP

## 2020-01-03 MED ORDER — POTASSIUM CHLORIDE CRYS ER 20 MEQ PO TBCR
40.0000 meq | EXTENDED_RELEASE_TABLET | Freq: Two times a day (BID) | ORAL | Status: DC
  Administered 2020-01-03 – 2020-01-06 (×7): 40 meq via ORAL
  Filled 2020-01-03 (×7): qty 2

## 2020-01-03 NOTE — Progress Notes (Signed)
NAME:  Erica Escobar, MRN:  [id number], DOB:  1936/06/12, LOS: 2 ADMISSION DATE:  01/01/2020, CONSULTATION DATE:  01/01/20 REFERRING MD:  Tamala Julian - [facility], CHIEF COMPLAINT:  SOB  Brief History   84 yo F with non-hodgkin's lymphoma found to have symptomatic R sided pleural effusion   History of present illness   84 yo F PMH color cancer s/p R colectomy, Non-hodgkin's lymphoma, COPD, CKD III, DM, GERD, Cirrhosis who presents to ED with SOB via EMS. EMS noted patient to be hypoxic SpO2 70s and was placed on NRB.Patient reported that SOB was gradual in onset and is worsened by exertion. She endorses associated loss of appetite which was noted earlier this month.  Of note, patient admitted 10/20-11/20 at which time she was found to have non-alcoholic cirrhosis and underwent a paracentesis.    In ED, patient SpO2 98-100% on 2LNC. CXR reveals R sided pleural effusion and PCCM is consulted for possible thoracentesis.   Influenza A/B, COVID-19 negative   Past Medical History  Arthritis COPD Cancer of ascending colon Depression GERD HLD HTN Insomnia  Cirrhosis of liver  CKD III  FTT Non-Hodgkin's Lymphoma  Chronic back pain RLS  Significant Hospital Events   01/01/20 admitted to hospitalist service. PCCM asked to evaluate in ED for pleural effusion   Consults:  PCCM  Procedures:  01/01/20 Right thoracentesis  Significant Diagnostic Tests:  01/01/20 CXR> R pleural effusion with near complete whiteout of R chest.   Micro Data:  01/01/20 Flu A/B > neg 01/01/20 COVID-19> neg   Antimicrobials:    Interim history/subjective:   Patient comfortable sitting up in bed eating breakfast.  No distress  Objective   Blood pressure 139/84, pulse 77, temperature 98.2 F (36.8 C), temperature source Oral, resp. rate 16, height 5' 1"  (1.549 m), weight 67.1 kg, SpO2 97 %.        Intake/Output Summary (Last 24 hours) at 01/03/2020 0944 Last data filed at 01/02/2020 1602 Gross per 24 hour    Intake 300 ml  Output 400 ml  Net -100 ml   Filed Weights   01/01/20 1312  Weight: 67.1 kg    Examination: General appearance: 84 y.o., female, NAD, conversant  Eyes: anicteric sclerae, tracking  HENT: NCAT; oropharynx, MMM Neck: Trachea midline; FROM, supple, lymphadenopathy, no JVD Lungs: Near absent breath sounds in the right base as compared to the left.  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 -Would consider palliative care input  -Appreciate gastroenterology input  Hx COPD Hx Non-Hodgkin's Lymphoma Hx Adenocarcinoma of ascending colon s/p R colectomy Hx cirrhosis  Hx CKD III P -per primary  Pulmonary will sign off.  Please call with any questions or concerns as we are happy to help any way we can.  Best  practice:  Diet: Fluid restricted  Pain/Anxiety/Delirium protocol (if indicated): na VAP protocol (if indicated): na DVT prophylaxis: lovenox  GI prophylaxis: na Glucose control: monitor Mobility: Recommend PT/OT Code Status: DNR Family Communication: per primary  Disposition: Telemetry    Labs   CBC: Recent Labs  Lab 01/01/20 1141 01/02/20 0523  WBC 4.6 5.1   NEUTROABS 2.8  --   HGB 12.7 11.0*  HCT 38.1 32.5*  MCV 100.8* 99.7  PLT 122* 105*    Basic Metabolic Panel: Recent Labs  Lab 01/01/20 1141 01/02/20 0523 01/03/20 0506  NA 140 140 139  K 3.8 4.1 3.4*  CL 105 104 103  CO2 23 28 28   GLUCOSE 126* 115* 97  BUN 15 16 25*  CREATININE 1.03* 1.00 1.10*  CALCIUM 8.6* 8.7* 8.2*   GFR: Estimated Creatinine Clearance: 35.1 mL/min (A) (by C-G formula based on SCr of 1.1 mg/dL (H)). Recent Labs  Lab 01/01/20 1141 01/02/20 0523  WBC 4.6 5.1    Liver Function Tests: Recent Labs  Lab 01/01/20 1141 01/02/20 0523 01/03/20 0506  AST 59* 41 44*  ALT 37 35 37  ALKPHOS 323* 280* 235*  BILITOT 2.1* 1.6* 1.8*  PROT 5.7* 5.1* 4.8*  ALBUMIN 3.2* 2.9* 2.8*   Recent Labs  Lab 01/01/20 1141  LIPASE 89*   No results for input(s): AMMONIA in the last 168 hours.  ABG    Component Value Date/Time   TCO2 26 03/10/2009 1712     Coagulation Profile: Recent Labs  Lab 01/01/20 1141  INR 1.0    Cardiac Enzymes: No results for input(s): CKTOTAL, CKMB, CKMBINDEX, TROPONINI in the last 168 hours.  HbA1C: Hgb A1c MFr Bld  Date/Time Value Ref Range Status  02/11/2015 01:30 PM 5.1 4.8 - 5.6 % Final    Comment:    (NOTE)         Pre-diabetes: 5.7 - 6.4         Diabetes: >6.4         Glycemic control for adults with diabetes: <7.0   03/10/2009 11:59 PM  4.6 - 6.1 % Final   4.9 (NOTE)   The ADA recommends the following therapeutic goal for glycemic   control related to Hgb A1C measurement:   Goal of Therapy:   < 7.0% Hgb A1C   Reference: American Diabetes Association: Clinical Practice   Recommendations 2008, Diabetes Care,  2008, 31:(Suppl 1).    CBG: No results for input(s): GLUCAP in the last 168 hours.  Garner Nash, DO [facility] 01/03/2020 9:44 AM

## 2020-01-03 NOTE — Progress Notes (Signed)
Subjective: Feeling better today.  Objective: Vital signs in last 24 hours: Temp:  [98 F (36.7 C)-98.5 F (36.9 C)] 98.2 F (36.8 C) (01/20 0700) Pulse Rate:  [73-95] 77 (01/20 0700) Resp:  [16-18] 16 (01/20 0700) BP: (96-139)/(64-84) 139/84 (01/20 0700) SpO2:  [97 %-100 %] 97 % (01/20 0700) Last BM Date: 01/02/20  Intake/Output from previous day: 01/19 0701 - 01/20 0700 In: 300 [P.O.:300] Out: 750 [Urine:750] Intake/Output this shift: No intake/output data recorded.  General appearance: alert and no distress Resp: Improved dullness to percussion in the right lung field. Cardio: regular rate and rhythm GI: soft, non-tender; bowel sounds normal; no masses,  no organomegaly Extremities: 4+ edema - improving.  More wrinkles in her skin.  Lab Results: Recent Labs    01/01/20 1141 01/02/20 0523  WBC 4.6 5.1  HGB 12.7 11.0*  HCT 38.1 32.5*  PLT 122* 105*   BMET Recent Labs    01/01/20 1141 01/02/20 0523 01/03/20 0506  NA 140 140 139  K 3.8 4.1 3.4*  CL 105 104 103  CO2 23 28 28   GLUCOSE 126* 115* 97  BUN 15 16 25*  CREATININE 1.03* 1.00 1.10*  CALCIUM 8.6* 8.7* 8.2*   LFT Recent Labs    01/03/20 0506  PROT 4.8*  ALBUMIN 2.8*  AST 44*  ALT 37  ALKPHOS 235*  BILITOT 1.8*   PT/INR Recent Labs    01/01/20 1141  LABPROT 13.2  INR 1.0   Hepatitis Panel No results for input(s): HEPBSAG, HCVAB, HEPAIGM, HEPBIGM in the last 72 hours. C-Diff No results for input(s): CDIFFTOX in the last 72 hours. Fecal Lactopherrin No results for input(s): FECLLACTOFRN in the last 72 hours.  Studies/Results: US Abdomen Complete  Result Date: 01/02/2020 CLINICAL DATA:  Elevated lipase. EXAM: ABDOMEN ULTRASOUND COMPLETE COMPARISON:  Paracentesis ultrasound 10/13/2019. CT of the abdomen and pelvis with contrast 10/12/2019 FINDINGS: Gallbladder: Surgically absent Common bile duct: Diameter: 6.8 mm, within normal limits following cholecystectomy. Liver: Liver is diffusely  echogenic. Nodular border is noted. No discrete lesions are present. Portal vein is patent on color Doppler imaging with normal direction of blood flow towards the liver. IVC: No abnormality visualized. Pancreas: Not well visualized due to overlying bowel gas. Spleen: Mildly enlarged at 14.0 cm maximally. No discrete lesions are present. Right Kidney: Length: 9.3 cm. Echogenicity within normal limits. No mass or hydronephrosis visualized. Left Kidney: Length: 10.3 cm. Echogenicity within normal limits. No mass or hydronephrosis visualized. Abdominal aorta: No aneurysm visualized. Other findings: Moderate diffuse abdominal ascites are present. IMPRESSION: 1. Small echogenic liver with nodular border consistent with cirrhosis. 2. Splenomegaly 3. Moderate abdominal ascites. Electronically Signed   By: San Morelle M.D.   On: 01/02/2020 06:31   DG CHEST PORT 1 VIEW  Result Date: 01/02/2020 CLINICAL DATA:  Pleural effusion. EXAM: PORTABLE CHEST 1 VIEW COMPARISON:  One-view chest x-ray 01/01/2020 FINDINGS: The heart size is normal. Progressive right pleural effusion and airspace disease is present. The left lung is clear. Lung volumes are low. IMPRESSION: Progressive right pleural effusion and airspace disease. While this may represent atelectasis, infection is not excluded. Electronically Signed   By: San Morelle M.D.   On: 01/02/2020 06:08   DG CHEST PORT 1 VIEW  Result Date: 01/01/2020 CLINICAL DATA:  Shortness of breath for 4 hours. EXAM: PORTABLE CHEST 1 VIEW COMPARISON:  January 01, 2020 FINDINGS: Skin folds are seen over the lateral left chest. A right-sided pleural effusion with underlying opacity remains but is smaller  in the interval. The cardiomediastinal silhouette is stable. No pneumothorax. No other interval changes. IMPRESSION: The right-sided pleural effusion with underlying opacity persists but is smaller in the interval. No other acute interval changes. Electronically Signed    By: Dorise Bullion III M.D   On: 01/01/2020 15:53   DG Chest Portable 1 View  Result Date: 01/01/2020 CLINICAL DATA:  Shortness of breath for 4 hours. Wheezing in the right lung. EXAM: PORTABLE CHEST 1 VIEW COMPARISON:  PA and lateral chest 12/25/2019. FINDINGS: Small right pleural effusion seen on the prior examination has markedly increased. The right chest is now almost completely opacified. The left lung is expanded and clear. No left effusion. Heart size is normal. No acute or focal bony abnormality. IMPRESSION: Near complete whiteout of the right chest is due to a large pleural effusion and airspace disease. The patient had only a small left effusion on the comparison exam. Electronically Signed   By: Inge Rise M.D.   On: 01/01/2020 11:38    Medications:  Scheduled: . enoxaparin (LOVENOX) injection  40 mg Subcutaneous Q24H  . feeding supplement (ENSURE ENLIVE)  237 mL Oral BID BM  . fludrocortisone  200 mcg Oral Daily  . furosemide  40 mg Oral Daily  . midodrine  2.5 mg Oral BID WC  . pantoprazole  40 mg Oral Daily  . sodium chloride flush  3 mL Intravenous Q12H  . spironolactone  100 mg Oral Daily  . sucralfate  1 g Oral TID WC & HS  . vitamin B-12  1,000 mcg Oral Daily   Continuous:   Assessment/Plan: 1) Decompensated cirrhosis. 2) Hepatic hydrothorax.   The right lung field demonstrates a drop in the air fluid level.  It is still present, but lower than when examined yesterday.  Her lower extremities also displays more wrinkles in her skin.  This AM's weight is 64.2 kg, which is a drop from 67.13 kg on admission.  Her potassium has dropped and there is a minor elevation in her creatinine.  A target discharge date is Friday to ensure a proper diuresis and stability of electrolytes and creatinine.  Plan: 1) Continue with Step I diuretics. 2) Continue with the 2 gram sodium diet. 3) Start on KCL. 4) Daily weights.  LOS: 2 days   Erica Escobar D 01/03/2020, 7:37 AM

## 2020-01-03 NOTE — Progress Notes (Signed)
PROGRESS NOTE    Erica Escobar  WUX:[id number] DOB: Apr 14, 1936 DOA: 01/01/2020 PCP: Deland Pretty, MD    Brief Narrative:  84 y.o. female with medical history significant of hypotention, hyperlipidemia, COPD, nonalcoholic liver cirrhosis, non-Hodgkin's lymphoma, colon cancer status post right colectomy in 02/2015, CKD stage III, anemia, and GERD.  She presents with complaints of shortness of breath with any kind of exertion along with abdominal distention and chest pain. Other associated symptoms include a dry nonproductive cough and bilateral lower extremity edema. Of note she was only  taking furosemide and spironolactone only if systolic BP was greater than 110.  Her daughter-in-law helps arrange her pills and notes that she takes those medicines may be 3 times a week due to her issues with low blood pressures.  She is being followed by Dr. [facility] in the outpatient setting. En route with EMS patient was noted to be hypoxic into the 70s on room air and improved after being placed on a nonrebreather. In the ED, Chest x-ray revealed complete opacification of the right lung field. TRH called to admit.  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 Complete opacification of the right lung on chest x-ray Pt s/p thoracentesis by PCCM with clinical improvement GI consulted, recommend lasix 40 mg, spironolactone 156m Continue daily potassium supplements, 2 g sodium diet Strict I's and O's, daily  weights Daily BMP  COPD Continue Albuterol nebulizer as needed  Elevated lipase Lipase elevated to 89  Abd UKoreareviewed, findings of mod ascites with splenomegaly and small echogenic liver with nodular border consistent with cirrhosis  Chronic ?orthostatic hypotension Continue Midodrin, Florinef  History of non-Hodgkin's lymphoma and colon cancer Patient status post robotic right colectomy in 2016 She is followed by Dr. SBenay Spiceof oncology and her non-Hodgkin's lymphoma is thought to be in remission. Continue outpatient follow-up with Dr. SBenay Spiceas outpatient  Chronic kidney disease stage III Stable, slight elevation  Continue to monitor with diuresis Daily BMP  GERD Continue Carafate and Protonix as tolerated  Chronic Thrombocytopenia Suspect likely related to patient's history of cirrhosis. Daily CBC     DVT prophylaxis: Lovenox subq Code Status: DNR Family Communication: None at bedside Disposition Plan: TBD  Consultants:   GI  PCCM  Procedures:   Thoracentesis 1/18  Antimicrobials: Anti-infectives (From admission, onward)   None      Subjective: Patient denies any new complaints  Objective: Vitals:   01/02/20 1406 01/02/20 1934 01/03/20 0700 01/03/20 1355  BP: 122/76 96/64 139/84 (!) 117/59  Pulse: 81 95 77 77  Resp: 18 17 16 16   Temp: 98 F (36.7 C) 98.5 F (36.9 C) 98.2 F (36.8 C) 97.6 F (36.4 C)  TempSrc:  Oral Oral Oral  SpO2: 100% 97% 97% 98%  Weight:      Height:       No intake or output data in the 24 hours ending 01/03/20 1644 Filed Weights   01/01/20 1312  Weight: 67.1 kg    Examination:  General: NAD   Cardiovascular:  S1, S2 present  Respiratory:  Diminished breath sounds on the right  Abdomen: Soft, nontender, nondistended, bowel sounds present  Musculoskeletal: 2+ bilateral pedal edema noted  Skin: Normal  Psychiatry: Normal mood    Data Reviewed: I have personally reviewed following labs and  imaging studies  CBC: Recent Labs  Lab 01/01/20 1141 01/02/20 0523  WBC 4.6 5.1  NEUTROABS 2.8  --   HGB 12.7 11.0*  HCT 38.1 32.5*  MCV 100.8* 99.7  PLT 122* 419*   Basic Metabolic Panel: Recent Labs  Lab 01/01/20 1141 01/02/20 0523 01/03/20 0506  NA 140 140 139  K 3.8 4.1 3.4*  CL 105 104 103  CO2 23 28 28   GLUCOSE 126* 115* 97  BUN 15 16 25*  CREATININE 1.03* 1.00 1.10*  CALCIUM 8.6* 8.7* 8.2*   GFR: Estimated Creatinine Clearance: 35.1 mL/min (A) (by C-G formula based on SCr of 1.1 mg/dL (H)). Liver Function Tests: Recent Labs  Lab 01/01/20 1141 01/02/20 0523 01/03/20 0506  AST 59* 41 44*  ALT 37 35 37  ALKPHOS 323* 280* 235*  BILITOT 2.1* 1.6* 1.8*  PROT 5.7* 5.1* 4.8*  ALBUMIN 3.2* 2.9* 2.8*   Recent Labs  Lab 01/01/20 1141  LIPASE 89*   No results for input(s): AMMONIA in the last 168 hours. Coagulation Profile: Recent Labs  Lab 01/01/20 1141  INR 1.0   Cardiac Enzymes: No results for input(s): CKTOTAL, CKMB, CKMBINDEX, TROPONINI in the last 168 hours. BNP (last 3 results) No results for input(s): PROBNP in the last 8760 hours. HbA1C: No results for input(s): HGBA1C in the last 72 hours. CBG: No results for input(s): GLUCAP in the last 168 hours. Lipid Profile: No results for input(s): CHOL, HDL, LDLCALC, TRIG, CHOLHDL, LDLDIRECT in the last 72 hours. Thyroid Function Tests: No results for input(s): TSH, T4TOTAL, FREET4, T3FREE, THYROIDAB in the last 72 hours. Anemia Panel: No results for input(s): VITAMINB12, FOLATE, FERRITIN, TIBC, IRON, RETICCTPCT in the last 72 hours. Sepsis Labs: No results for input(s): PROCALCITON, LATICACIDVEN in the last 168 hours.  Recent Results (from the past 240 hour(s))  Respiratory Panel by RT PCR (Flu A&B, Covid) - Nasopharyngeal Swab     Status: None   Collection Time: 01/01/20 11:18 AM   Specimen: Nasopharyngeal Swab  Result Value Ref Range Status   SARS Coronavirus 2 by RT PCR NEGATIVE NEGATIVE  Final    Comment: (NOTE) SARS-CoV-2 target nucleic acids are NOT DETECTED. The SARS-CoV-2 RNA is generally detectable in upper respiratoy specimens during the acute phase of infection. The lowest concentration of SARS-CoV-2 viral copies this assay can detect is 131 copies/mL. A negative result does not preclude SARS-Cov-2 infection and should not be used as the sole basis for treatment or other patient management decisions. A negative result may occur with  improper specimen collection/handling, submission of specimen other than nasopharyngeal swab, presence of viral mutation(s) within the areas targeted by this assay, and inadequate number of viral copies (<131 copies/mL). A negative result must be combined with clinical observations, patient history, and epidemiological information. The expected result is Negative. Fact Sheet for Patients:  PinkCheek.be Fact Sheet for Healthcare Providers:  GravelBags.it This test is not yet ap proved or cleared by the [place] FDA and  has been authorized for detection and/or diagnosis of SARS-CoV-2 by FDA under an Emergency Use Authorization (EUA). This EUA will remain  in effect (meaning this test can be used) for the duration of the COVID-19 declaration under Section 564(b)(1) of  the Act, 21 U.S.C. section 360bbb-3(b)(1), unless the authorization is terminated or revoked sooner.    Influenza A by PCR NEGATIVE NEGATIVE Final   Influenza B by PCR NEGATIVE NEGATIVE Final    Comment: (NOTE) The Xpert Xpress SARS-CoV-2/FLU/RSV assay is intended as an aid in  the diagnosis of influenza from Nasopharyngeal swab specimens and  should not be used as a sole basis for treatment. Nasal washings and  aspirates are unacceptable for Xpert Xpress SARS-CoV-2/FLU/RSV  testing. Fact Sheet for Patients: PinkCheek.be Fact Sheet for Healthcare  Providers: GravelBags.it This test is not yet approved or cleared by the [place] FDA and  has been authorized for detection and/or diagnosis of SARS-CoV-2 by  FDA under an Emergency Use Authorization (EUA). This EUA will remain  in effect (meaning this test can be used) for the duration of the  Covid-19 declaration under Section 564(b)(1) of the Act, 21  U.S.C. section 360bbb-3(b)(1), unless the authorization is  terminated or revoked. Performed at [facility], [place] [address] [place], [place] [address]   Body fluid culture (includes gram stain)     Status: None (Preliminary result)   Collection Time: 01/01/20  3:40 PM   Specimen: Pleural Fluid  Result Value Ref Range Status   Specimen Description   Final    FLUID PLEURAL Performed at [facility], [place] [address], [place], [place] [address]    Special Requests   Final    NONE Performed at [facility], [place] [address], [place], [place] [address]    Gram Stain NO WBC SEEN NO ORGANISMS SEEN   Final   Culture   Final    NO GROWTH 2 DAYS Performed at [facility], [place] [address] [place], [place] [address]    Report Status PENDING  Incomplete     Radiology Studies: US Abdomen Complete  Result Date: 01/02/2020 CLINICAL DATA:  Elevated lipase. EXAM: ABDOMEN ULTRASOUND COMPLETE COMPARISON:  Paracentesis ultrasound 10/13/2019. CT of the abdomen and pelvis with contrast 10/12/2019 FINDINGS: Gallbladder: Surgically absent Common bile duct: Diameter: 6.8 mm, within normal limits following cholecystectomy. Liver: Liver is diffusely echogenic. Nodular border is noted. No discrete lesions are present. Portal vein is patent on color Doppler imaging with normal direction of blood flow towards the liver. IVC: No abnormality visualized. Pancreas: Not well visualized due to overlying bowel gas. Spleen: Mildly enlarged at 14.0 cm maximally. No discrete lesions are  present. Right Kidney: Length: 9.3 cm. Echogenicity within normal limits. No mass or hydronephrosis visualized. Left Kidney: Length: 10.3 cm. Echogenicity within normal limits. No mass or hydronephrosis visualized. Abdominal aorta: No aneurysm visualized. Other findings: Moderate diffuse abdominal ascites are present. IMPRESSION: 1. Small echogenic liver with nodular border consistent with cirrhosis. 2. Splenomegaly 3. Moderate abdominal ascites. Electronically Signed   By: San Morelle M.D.   On: 01/02/2020 06:31   DG CHEST PORT 1 VIEW  Result Date: 01/02/2020 CLINICAL DATA:  Pleural effusion. EXAM: PORTABLE CHEST 1 VIEW COMPARISON:  One-view chest x-ray 01/01/2020 FINDINGS: The heart size is normal. Progressive right pleural effusion and airspace disease is present. The left lung is clear. Lung volumes are low. IMPRESSION: Progressive right pleural effusion and airspace disease. While this may represent atelectasis, infection is not excluded. Electronically Signed   By: San Morelle M.D.   On: 01/02/2020 06:08    Scheduled Meds: . enoxaparin (LOVENOX) injection  40 mg Subcutaneous Q24H  . feeding supplement (ENSURE ENLIVE)  237 mL Oral  BID BM  . fludrocortisone  200 mcg Oral Daily  . furosemide  40 mg Oral Daily  . midodrine  2.5 mg Oral BID WC  . pantoprazole  40 mg Oral Daily  . potassium chloride  40 mEq Oral BID  . sodium chloride flush  3 mL Intravenous Q12H  . spironolactone  100 mg Oral Daily  . sucralfate  1 g Oral TID WC & HS  . vitamin B-12  1,000 mcg Oral Daily   Continuous Infusions:   LOS: 2 days   Alma Friendly, MD [facility] Pager On Amion  If 7PM-7AM, please contact night-coverage 01/03/2020, 4:44 PM

## 2020-01-04 ENCOUNTER — Inpatient Hospital Stay (HOSPITAL_COMMUNITY): Payer: Medicare Other

## 2020-01-04 LAB — BASIC METABOLIC PANEL
Anion gap: 10 (ref 5–15)
BUN: 27 mg/dL — ABNORMAL HIGH (ref 8–23)
CO2: 28 mmol/L (ref 22–32)
Calcium: 8.3 mg/dL — ABNORMAL LOW (ref 8.9–10.3)
Chloride: 101 mmol/L (ref 98–111)
Creatinine, Ser: 0.92 mg/dL (ref 0.44–1.00)
GFR calc Af Amer: >60 mL/min (ref >60)
GFR calc non Af Amer: 58 mL/min — ABNORMAL LOW (ref >60)
Glucose, Bld: 89 mg/dL (ref 70–99)
Potassium: 3.8 mmol/L (ref 3.5–5.1)
Sodium: 139 mmol/L (ref 135–145)

## 2020-01-04 LAB — CBC WITH DIFFERENTIAL/PLATELET
Abs Immature Granulocytes: 0.01 10*3/uL (ref 0.00–0.07)
Basophils Absolute: 0 10*3/uL (ref 0.0–0.1)
Basophils Relative: 0 %
Eosinophils Absolute: 0.1 10*3/uL (ref 0.0–0.5)
Eosinophils Relative: 3 %
HCT: 33.4 % — ABNORMAL LOW (ref 36.0–46.0)
Hemoglobin: 11.1 g/dL — ABNORMAL LOW (ref 12.0–15.0)
Immature Granulocytes: 0 %
Lymphocytes Relative: 17 %
Lymphs Abs: 0.5 10*3/uL — ABNORMAL LOW (ref 0.7–4.0)
MCH: 33.7 pg (ref 26.0–34.0)
MCHC: 33.2 g/dL (ref 30.0–36.0)
MCV: 101.5 fL — ABNORMAL HIGH (ref 80.0–100.0)
Monocytes Absolute: 0.5 10*3/uL (ref 0.1–1.0)
Monocytes Relative: 18 %
Neutro Abs: 1.8 10*3/uL (ref 1.7–7.7)
Neutrophils Relative %: 62 %
Platelets: 104 10*3/uL — ABNORMAL LOW (ref 150–400)
RBC: 3.29 MIL/uL — ABNORMAL LOW (ref 3.87–5.11)
RDW: 14.5 % (ref 11.5–15.5)
WBC: 3 10*3/uL — ABNORMAL LOW (ref 4.0–10.5)
nRBC: 0 % (ref 0.0–0.2)

## 2020-01-04 NOTE — Progress Notes (Signed)
PROGRESS NOTE    Erica Escobar  PNT:[id number] DOB: February 18, 1936 DOA: 01/01/2020 PCP: Deland Pretty, MD    Brief Narrative:  84 y.o. female with medical history significant of hypotention, hyperlipidemia, COPD, nonalcoholic liver cirrhosis, non-Hodgkin's lymphoma, colon cancer status post right colectomy in 02/2015, CKD stage III, anemia, and GERD.  She presents with complaints of shortness of breath with any kind of exertion along with abdominal distention and chest pain. Other associated symptoms include a dry nonproductive cough and bilateral lower extremity edema. Of note she was only  taking furosemide and spironolactone only if systolic BP was greater than 110.  Her daughter-in-law helps arrange her pills and notes that she takes those medicines may be 3 times a week due to her issues with low blood pressures.  She is being followed by Dr. [facility] in the outpatient setting. En route with EMS patient was noted to be hypoxic into the 70s on room air and improved after being placed on a nonrebreather. In the ED, Chest x-ray revealed complete opacification of the right lung field. TRH called to admit.  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 Complete opacification of the right lung on chest x-ray on presentation  Pt s/p thoracentesis by PCCM on 01/01/20 with drainage of 2400 ml of clear pleural fluid with clinical improvement GI consulted, recommend lasix 40 mg, spironolactone 134m Continue  daily potassium supplements, 2 g sodium diet Strict I's and O's, daily weights Daily BMP  COPD Continue Albuterol nebulizer as needed  Elevated lipase Lipase elevated to 89  Abd UKoreareviewed, findings of mod ascites with splenomegaly and small echogenic liver with nodular border consistent with cirrhosis  Chronic ?orthostatic hypotension Continue Midodrin, Florinef  History of non-Hodgkin's lymphoma and colon cancer Patient status post robotic right colectomy in 2016 She is followed by Dr. SBenay Spiceof oncology and her non-Hodgkin's lymphoma is thought to be in remission. Continue outpatient follow-up with Dr. SBenay Spiceas outpatient  Chronic kidney disease stage III Stable, slight elevation  Continue to monitor with diuresis Daily BMP  GERD Continue Carafate and Protonix as tolerated  Chronic Thrombocytopenia Suspect likely related to patient's history of cirrhosis. Daily CBC     DVT prophylaxis: Lovenox subq Code Status: DNR Family Communication: None at bedside Disposition Plan: Likely home  Consultants:   GI  PCCM  Procedures:   Thoracentesis 1/18  Antimicrobials: Anti-infectives (From admission, onward)   None      Subjective: Patient denies any new complaints, waiting for her breakfast, very hungry  Objective: Vitals:   01/03/20 1355 01/03/20 2147 01/04/20 0538 01/04/20 1338  BP: (!) 117/59 121/70 137/77 121/72  Pulse: 77 82 75 83  Resp: 16 16 18 18   Temp: 97.6 F (36.4 C) 98.5 F (36.9 C) 98.4 F (36.9 C) 98.6 F (37 C)  TempSrc: Oral Oral Oral Oral  SpO2: 98% 97% 97% 96%  Weight:   63 kg   Height:        Intake/Output Summary (Last 24 hours) at 01/04/2020 1559 Last  data filed at 01/04/2020 1300 Gross per 24 hour  Intake 1427 ml  Output 350 ml  Net 1077 ml   Filed Weights   01/01/20 1312 01/03/20 0800 01/04/20 0538  Weight: 67.1 kg 64.2 kg 63 kg    Examination:  General: NAD   Cardiovascular: S1, S2  present  Respiratory:  Diminished breath sounds on the right  Abdomen: Soft, nontender, nondistended, bowel sounds present  Musculoskeletal: 2+ bilateral pedal edema noted  Skin: Normal  Psychiatry: Normal mood    Data Reviewed: I have personally reviewed following labs and imaging studies  CBC: Recent Labs  Lab 01/01/20 1141 01/02/20 0523 01/04/20 0600  WBC 4.6 5.1 3.0*  NEUTROABS 2.8  --  1.8  HGB 12.7 11.0* 11.1*  HCT 38.1 32.5* 33.4*  MCV 100.8* 99.7 101.5*  PLT 122* 105* 025*   Basic Metabolic Panel: Recent Labs  Lab 01/01/20 1141 01/02/20 0523 01/03/20 0506 01/04/20 0600  NA 140 140 139 139  K 3.8 4.1 3.4* 3.8  CL 105 104 103 101  CO2 23 28 28 28   GLUCOSE 126* 115* 97 89  BUN 15 16 25* 27*  CREATININE 1.03* 1.00 1.10* 0.92  CALCIUM 8.6* 8.7* 8.2* 8.3*   GFR: Estimated Creatinine Clearance: 40.8 mL/min (by C-G formula based on SCr of 0.92 mg/dL). Liver Function Tests: Recent Labs  Lab 01/01/20 1141 01/02/20 0523 01/03/20 0506  AST 59* 41 44*  ALT 37 35 37  ALKPHOS 323* 280* 235*  BILITOT 2.1* 1.6* 1.8*  PROT 5.7* 5.1* 4.8*  ALBUMIN 3.2* 2.9* 2.8*   Recent Labs  Lab 01/01/20 1141  LIPASE 89*   No results for input(s): AMMONIA in the last 168 hours. Coagulation Profile: Recent Labs  Lab 01/01/20 1141  INR 1.0   Cardiac Enzymes: No results for input(s): CKTOTAL, CKMB, CKMBINDEX, TROPONINI in the last 168 hours. BNP (last 3 results) No results for input(s): PROBNP in the last 8760 hours. HbA1C: No results for input(s): HGBA1C in the last 72 hours. CBG: No results for input(s): GLUCAP in the last 168 hours. Lipid Profile: No results for input(s): CHOL, HDL, LDLCALC, TRIG, CHOLHDL, LDLDIRECT in the last 72 hours. Thyroid Function Tests: No results for input(s): TSH, T4TOTAL, FREET4, T3FREE, THYROIDAB in the last 72 hours. Anemia Panel: No results for input(s): VITAMINB12, FOLATE, FERRITIN, TIBC, IRON, RETICCTPCT in the last 72  hours. Sepsis Labs: No results for input(s): PROCALCITON, LATICACIDVEN in the last 168 hours.  Recent Results (from the past 240 hour(s))  Respiratory Panel by RT PCR (Flu A&B, Covid) - Nasopharyngeal Swab     Status: None   Collection Time: 01/01/20 11:18 AM   Specimen: Nasopharyngeal Swab  Result Value Ref Range Status   SARS Coronavirus 2 by RT PCR NEGATIVE NEGATIVE Final    Comment: (NOTE) SARS-CoV-2 target nucleic acids are NOT DETECTED. The SARS-CoV-2 RNA is generally detectable in upper respiratoy specimens during the acute phase of infection. The lowest concentration of SARS-CoV-2 viral copies this assay can detect is 131 copies/mL. A negative result does not preclude SARS-Cov-2 infection and should not be used as the sole basis for treatment or other patient management decisions. A negative result may occur with  improper specimen collection/handling, submission of specimen other than nasopharyngeal swab, presence of viral mutation(s) within the areas targeted by this assay, and inadequate number of viral copies (<131 copies/mL). A negative result must be combined with clinical observations, patient history, and epidemiological information. The expected result is Negative. Fact Sheet for  Patients:  PinkCheek.be Fact Sheet for Healthcare Providers:  GravelBags.it This test is not yet ap proved or cleared by the [place] and  has been authorized for detection and/or diagnosis of SARS-CoV-2 by FDA under an Emergency Use Authorization (EUA). This EUA will remain  in effect (meaning this test can be used) for the duration of the COVID-19 declaration under Section 564(b)(1) of the Act, 21 U.S.C. section 360bbb-3(b)(1), unless the authorization is terminated or revoked sooner.    Influenza A by PCR NEGATIVE NEGATIVE Final   Influenza B by PCR NEGATIVE NEGATIVE Final    Comment: (NOTE) The Xpert Xpress  SARS-CoV-2/FLU/RSV assay is intended as an aid in  the diagnosis of influenza from Nasopharyngeal swab specimens and  should not be used as a sole basis for treatment. Nasal washings and  aspirates are unacceptable for Xpert Xpress SARS-CoV-2/FLU/RSV  testing. Fact Sheet for Patients: PinkCheek.be Fact Sheet for Healthcare Providers: GravelBags.it This test is not yet approved or cleared by the [place] FDA and  has been authorized for detection and/or diagnosis of SARS-CoV-2 by  FDA under an Emergency Use Authorization (EUA). This EUA will remain  in effect (meaning this test can be used) for the duration of the  Covid-19 declaration under Section 564(b)(1) of the Act, 21  U.S.C. section 360bbb-3(b)(1), unless the authorization is  terminated or revoked. Performed at [facility], [place] [address], [place], [place] [address]   Body fluid culture (includes gram stain)     Status: None (Preliminary result)   Collection Time: 01/01/20  3:40 PM   Specimen: Pleural Fluid  Result Value Ref Range Status   Specimen Description   Final    FLUID PLEURAL Performed at [facility], [place] [address], [place], [place] [address]    Special Requests   Final    NONE Performed at [facility], [place] [address], [place], [place] [address]    Gram Stain NO WBC SEEN NO ORGANISMS SEEN   Final   Culture   Final    NO GROWTH 3 DAYS Performed at [facility], [place] [address], [place], [place] [address]    Report Status PENDING  Incomplete     Radiology Studies: DG CHEST PORT 1 VIEW  Result Date: 01/04/2020 CLINICAL DATA:  84 year old female with history of pleural effusion. EXAM: PORTABLE CHEST 1 VIEW COMPARISON:  Chest x-ray 01/02/2020. FINDINGS: Large right pleural effusion with associated areas of probable passive atelectasis in the dependent portions of the right lung. No left pleural  effusion. No definite consolidative airspace disease in the left lung. Airspace consolidation in the right base is not excluded. No evidence of pulmonary edema. Heart size is normal. Upper mediastinal contours are within normal limits. IMPRESSION: 1. Enlarging large right pleural effusion with areas of atelectasis and/or consolidation in the base of the right lung. Electronically Signed   By: Vinnie Langton M.D.   On: 01/04/2020 08:46    Scheduled Meds: . enoxaparin (LOVENOX) injection  40 mg Subcutaneous Q24H  . feeding supplement (ENSURE ENLIVE)  237 mL Oral BID BM  . fludrocortisone  200 mcg Oral Daily  . furosemide  40 mg Oral Daily  . midodrine  2.5 mg Oral BID WC  . pantoprazole  40 mg Oral Daily  . potassium chloride  40 mEq Oral BID  . sodium chloride flush  3 mL Intravenous Q12H  . spironolactone  100 mg Oral Daily  . sucralfate  1  g Oral TID WC & HS  . vitamin B-12  1,000 mcg Oral Daily   Continuous Infusions:   LOS: 3 days   Alma Friendly, MD [facility] Pager On Amion  If 7PM-7AM, please contact night-coverage 01/04/2020, 3:59 PM

## 2020-01-04 NOTE — Evaluation (Signed)
Occupational Therapy Evaluation Patient Details Name: Erica Escobar MRN: [id number] DOB: 06-06-1936 Today's Date: 01/04/2020    History of Present Illness 84 y.o. female with medical history significant of hypotention, hyperlipidemia, COPD, nonalcoholic liver cirrhosis, non-Hodgkin's lymphoma, colon cancer status post right colectomy in 02/2015, CKD stage III, anemia, and GERD admitted with shortness of breath. Dx of acute respiratory failure, R pleural effusion s/p thoracentesis, cirrhosis, ascites.   Clinical Impression   Pt admitted with hypoxia. Pt currently with functional limitations due to the deficits listed below (see OT Problem List).  Pt will benefit from skilled OT to increase their safety and independence with ADL and functional mobility for ADL to facilitate discharge to venue listed below.      Follow Up Recommendations  SNF;Home health OT;Supervision/Assistance - 24 hour    Equipment Recommendations  None recommended by OT    Recommendations for Other Services       Precautions / Restrictions Precautions Precautions: Fall Precaution Comments: pt reports h/o 12 falls in past year, she stated falls are 2* dizziness Restrictions Weight Bearing Restrictions: No      Mobility Bed Mobility Overal bed mobility: Needs Assistance Bed Mobility: Supine to Sit     Supine to sit: Min assist     General bed mobility comments: min A for LEs into bed  Transfers Overall transfer level: Needs assistance Equipment used: Rolling walker (2 wheeled) Transfers: Sit to/from [facility] Sit to Stand: Min assist Stand pivot transfers: Min assist       General transfer comment: increased time    Balance Overall balance assessment: Needs assistance;History of Falls Sitting-balance support: Feet supported;Single extremity supported Sitting balance-Leahy Scale: Good       Standing balance-Leahy Scale: Fair                             ADL  either performed or assessed with clinical judgement   ADL Overall ADL's : Needs assistance/impaired Eating/Feeding: Set up;Sitting   Grooming: Minimal assistance;Standing   Upper Body Bathing: Minimal assistance;Standing   Lower Body Bathing: Moderate assistance;Sit to/from stand;Cueing for safety;Cueing for compensatory techniques;Cueing for sequencing   Upper Body Dressing : Minimal assistance;Cueing for safety;Sitting   Lower Body Dressing: Sit to/from stand;Moderate assistance;Sitting/lateral leans   Toilet Transfer: Moderate assistance;RW;BSC;Cueing for safety;Stand-pivot   Toileting- Clothing Manipulation and Hygiene: Moderate assistance;Sit to/from stand;Cueing for safety;Cueing for compensatory techniques;Cueing for sequencing       Functional mobility during ADLs: Minimal assistance;Moderate assistance General ADL Comments: pts lives alone and will need A. OT did call son and commnunicate this. Pt aware she needs A but doesnt want to go to SNF                  Pertinent Vitals/Pain Pain Assessment: No/denies pain     Hand Dominance     Extremity/Trunk Assessment Upper Extremity Assessment Upper Extremity Assessment: Generalized weakness           Communication Communication Communication: HOH   Cognition Arousal/Alertness: Awake/alert Behavior During Therapy: WFL for tasks assessed/performed Overall Cognitive Status: Within Functional Limits for tasks assessed                                                Home Living Family/patient expects to be discharged to:: Private residence Living Arrangements:  Alone Available Help at Discharge: Family;Neighbor;Available PRN/intermittently Type of Home: Mobile home Home Access: Ramped entrance     Home Layout: One level               Home Equipment: [place] - 4 wheels;Bedside commode;Shower seat          Prior Functioning/Environment Level of Independence: Independent with  assistive device(s)        Comments: son drives her to get groceries, bathes and dresses independently, walks with rollator        OT Problem List: Decreased strength;Decreased activity tolerance;Impaired balance (sitting and/or standing);Decreased safety awareness;Decreased knowledge of use of DME or AE      OT Treatment/Interventions: Self-care/ADL training;Patient/family education;DME and/or AE instruction;Therapeutic activities    OT Goals(Current goals can be found in the care plan section) Acute Rehab OT Goals Patient Stated Goal: return home OT Goal Formulation: With patient Time For Goal Achievement: 01/11/20 ADL Goals Pt Will Perform Grooming: with supervision;standing Pt Will Perform Lower Body Dressing: with supervision;sit to/from stand Pt Will Transfer to Toilet: with supervision;regular height toilet Pt Will Perform Toileting - Clothing Manipulation and hygiene: with supervision;sit to/from stand Pt Will Perform Tub/Shower Transfer: Shower transfer;with supervision  OT Frequency: Min 2X/week   Barriers to D/C: Decreased caregiver support             AM-PAC OT "6 Clicks" Daily Activity     Outcome Measure Help from another person eating meals?: A Little Help from another person taking care of personal grooming?: A Little Help from another person toileting, which includes using toliet, bedpan, or urinal?: A Little Help from another person bathing (including washing, rinsing, drying)?: A Little Help from another person to put on and taking off regular upper body clothing?: A Little Help from another person to put on and taking off regular lower body clothing?: A Lot 6 Click Score: 17   End of Session Equipment Utilized During Treatment: Rolling walker;Gait belt Nurse Communication: Mobility status  Activity Tolerance: Patient limited by fatigue Patient left: in chair;with call bell/phone within reach;with chair alarm set  OT Visit Diagnosis: Unsteadiness on  feet (R26.81);Repeated falls (R29.6);Muscle weakness (generalized) (M62.81);History of falling (Z91.81)                Time: 0037-9444 OT Time Calculation (min): 45 min Charges:  OT General Charges $OT Visit: 1 Visit OT Evaluation $OT Eval Moderate Complexity: 1 Mod OT Treatments $Self Care/Home Management : 8-22 mins  Kari Baars, OT Acute Rehabilitation Services [phone] Office- [phone], Edwena Felty D 01/04/2020, 5:14 PM

## 2020-01-04 NOTE — Progress Notes (Signed)
Subjective: Continues to feel well.  Objective: Vital signs in last 24 hours: Temp:  [97.6 F (36.4 C)-98.5 F (36.9 C)] 98.4 F (36.9 C) (01/21 0538) Pulse Rate:  [75-82] 75 (01/21 0538) Resp:  [16-18] 18 (01/21 0538) BP: (117-137)/(59-77) 137/77 (01/21 0538) SpO2:  [97 %-98 %] 97 % (01/21 0538) Weight:  [63 kg-64.2 kg] 63 kg (01/21 0538) Last BM Date: 01/03/20  Intake/Output from previous day: 01/20 0701 - 01/21 0700 In: 944 [P.O.:944] Out: 350 [Urine:350] Intake/Output this shift: No intake/output data recorded.  General appearance: alert and no distress Resp: Decreasing dullness to percussion in the right lung field Cardio: regular rate and rhythm GI: soft, non-tender; bowel sounds normal; no masses,  no organomegaly Extremities: 2+ edema  Lab Results: Recent Labs    01/01/20 1141 01/02/20 0523 01/04/20 0600  WBC 4.6 5.1 3.0*  HGB 12.7 11.0* 11.1*  HCT 38.1 32.5* 33.4*  PLT 122* 105* 104*   BMET Recent Labs    01/02/20 0523 01/03/20 0506 01/04/20 0600  NA 140 139 139  K 4.1 3.4* 3.8  CL 104 103 101  CO2 28 28 28   GLUCOSE 115* 97 89  BUN 16 25* 27*  CREATININE 1.00 1.10* 0.92  CALCIUM 8.7* 8.2* 8.3*   LFT Recent Labs    01/03/20 0506  PROT 4.8*  ALBUMIN 2.8*  AST 44*  ALT 37  ALKPHOS 235*  BILITOT 1.8*   PT/INR Recent Labs    01/01/20 1141  LABPROT 13.2  INR 1.0   Hepatitis Panel No results for input(s): HEPBSAG, HCVAB, HEPAIGM, HEPBIGM in the last 72 hours. C-Diff No results for input(s): CDIFFTOX in the last 72 hours. Fecal Lactopherrin No results for input(s): FECLLACTOFRN in the last 72 hours.  Studies/Results: No results found.  Medications:  Scheduled: . enoxaparin (LOVENOX) injection  40 mg Subcutaneous Q24H  . feeding supplement (ENSURE ENLIVE)  237 mL Oral BID BM  . fludrocortisone  200 mcg Oral Daily  . furosemide  40 mg Oral Daily  . midodrine  2.5 mg Oral BID WC  . pantoprazole  40 mg Oral Daily  . potassium  chloride  40 mEq Oral BID  . sodium chloride flush  3 mL Intravenous Q12H  . spironolactone  100 mg Oral Daily  . sucralfate  1 g Oral TID WC & HS  . vitamin B-12  1,000 mcg Oral Daily   Continuous:   Assessment/Plan: 1) Decompensated cirrhosis. 2) Hepatic hydrothorax.   The patient continues to feel well.  Her weight has declined down to 62.8 kg (I weighed her in bed).  The BMP is pending today.  She is experiencing rapid diuresis and most likely her lasix will be decreased to 20 mg at the time of discharge.  Plan: 1) Continue with Step I diuretics. 2) Continue to monitor her weight. 3) Continue with KCL. 4) CXR today to follow her pleural effusion.   LOS: 3 days   Grethel Zenk D 01/04/2020, 7:13 AM

## 2020-01-04 NOTE — Evaluation (Signed)
Physical Therapy Evaluation Patient Details Name: Erica Escobar MRN: [id number] DOB: [birth year] Today's Date: 01/04/2020   History of Present Illness  84 y.o. female with medical history significant of hypotention, hyperlipidemia, COPD, nonalcoholic liver cirrhosis, non-Hodgkin's lymphoma, colon cancer status post right colectomy in 02/2015, CKD stage III, anemia, and GERD admitted with shortness of breath. Dx of acute respiratory failure, R pleural effusion s/p thoracentesis, cirrhosis, ascites.  Clinical Impression  Pt admitted with above diagnosis. Pt ambulated 36' with RW, no loss of balance, distance limited by fatigue/dyspnea. SaO2 94% on room air while walking, HR 104 max. Pt reports she has needed DME and assistance from son/neighbors. I expect she can safely DC home.  Pt currently with functional limitations due to the deficits listed below (see PT Problem List). Pt will benefit from skilled PT to increase their independence and safety with mobility to allow discharge to the venue listed below.       Follow Up Recommendations Home health PT    Equipment Recommendations  None recommended by PT    Recommendations for Other Services       Precautions / Restrictions Precautions Precautions: Fall Precaution Comments: pt reports h/o 12 falls in past year, she stated falls are 2* dizziness Restrictions Weight Bearing Restrictions: No      Mobility  Bed Mobility Overal bed mobility: Needs Assistance Bed Mobility: Sit to Supine       Sit to supine: Min assist   General bed mobility comments: min A for LEs into bed  Transfers Overall transfer level: Needs assistance Equipment used: Rolling walker (2 wheeled) Transfers: Sit to/from Stand Sit to Stand: Min guard         General transfer comment: min/guard for safety  Ambulation/Gait Ambulation/Gait assistance: Min guard Gait Distance (Feet): 45 Feet Assistive device: Rolling walker (2 wheeled) Gait  Pattern/deviations: Step-through pattern;Decreased stride length Gait velocity: decr   General Gait Details: steady with RW, no loss of balance, SaO2 94% on room air walking, 2/4 dyspnea with wheezing noted, HR 104 walking  Stairs            Wheelchair Mobility    Modified Rankin (Stroke Patients Only)       Balance Overall balance assessment: Needs assistance;History of Falls Sitting-balance support: Feet supported;Single extremity supported Sitting balance-Leahy Scale: Good       Standing balance-Leahy Scale: Fair                               Pertinent Vitals/Pain Pain Assessment: Faces Faces Pain Scale: Hurts a little bit Pain Location: "all over" Pain Descriptors / Indicators: Sore Pain Intervention(s): Limited activity within patient's tolerance;Monitored during session    Home Living Family/patient expects to be discharged to:: Private residence Living Arrangements: Alone Available Help at Discharge: Family;Neighbor;Available PRN/intermittently Type of Home: Mobile home Home Access: Ramped entrance     Home Layout: One level Home Equipment: [employer] - 4 wheels;Bedside commode;Shower seat      Prior Function Level of Independence: Independent with assistive device(s)         Comments: son drives her to get groceries, bathes and dresses independently, walks with rollator     Hand Dominance        Extremity/Trunk Assessment   Upper Extremity Assessment Upper Extremity Assessment: Defer to OT evaluation    Lower Extremity Assessment Lower Extremity Assessment: Overall WFL for tasks assessed    Cervical / Trunk Assessment Cervical /  Trunk Assessment: Normal  Communication   Communication: HOH  Cognition Arousal/Alertness: Awake/alert Behavior During Therapy: WFL for tasks assessed/performed Overall Cognitive Status: Within Functional Limits for tasks assessed                                        General  Comments      Exercises     Assessment/Plan    PT Assessment Patient needs continued PT services  PT Problem List Decreased mobility;Decreased activity tolerance       PT Treatment Interventions Gait training;Therapeutic exercise;Therapeutic activities    PT Goals (Current goals can be found in the Care Plan section)  Acute Rehab PT Goals Patient Stated Goal: return home PT Goal Formulation: With patient Time For Goal Achievement: 01/18/20 Potential to Achieve Goals: Good    Frequency Min 3X/week   Barriers to discharge        Co-evaluation               AM-PAC PT "6 Clicks" Mobility  Outcome Measure Help needed turning from your back to your side while in a flat bed without using bedrails?: None Help needed moving from lying on your back to sitting on the side of a flat bed without using bedrails?: A Little Help needed moving to and from a bed to a chair (including a wheelchair)?: A Little Help needed standing up from a chair using your arms (e.g., wheelchair or bedside chair)?: A Little Help needed to walk in hospital room?: A Little Help needed climbing 3-5 steps with a railing? : A Little 6 Click Score: 19    End of Session Equipment Utilized During Treatment: Gait belt Activity Tolerance: Patient tolerated treatment well Patient left: in bed;with call bell/phone within reach;with bed alarm set Nurse Communication: Mobility status PT Visit Diagnosis: Difficulty in walking, not elsewhere classified (R26.2)    Time: 0940-1010 PT Time Calculation (min) (ACUTE ONLY): 30 min   Charges:   PT Evaluation $PT Eval Low Complexity: 1 Low PT Treatments $Gait Training: 8-22 mins       Blondell Reveal Kistler PT 01/04/2020  Acute Rehabilitation Services Pager [phone] Office [phone]

## 2020-01-05 ENCOUNTER — Inpatient Hospital Stay (HOSPITAL_COMMUNITY): Payer: Medicare Other

## 2020-01-05 ENCOUNTER — Other Ambulatory Visit: Payer: Medicare Other | Admitting: Adult Health Nurse Practitioner

## 2020-01-05 HISTORY — PX: IR THORACENTESIS ASP PLEURAL SPACE W/IMG GUIDE: IMG5380

## 2020-01-05 LAB — CBC WITH DIFFERENTIAL/PLATELET
Abs Immature Granulocytes: 0.01 10*3/uL (ref 0.00–0.07)
Basophils Absolute: 0 10*3/uL (ref 0.0–0.1)
Basophils Relative: 0 %
Eosinophils Absolute: 0.1 10*3/uL (ref 0.0–0.5)
Eosinophils Relative: 3 %
HCT: 33 % — ABNORMAL LOW (ref 36.0–46.0)
Hemoglobin: 11.2 g/dL — ABNORMAL LOW (ref 12.0–15.0)
Immature Granulocytes: 0 %
Lymphocytes Relative: 18 %
Lymphs Abs: 0.5 10*3/uL — ABNORMAL LOW (ref 0.7–4.0)
MCH: 34.3 pg — ABNORMAL HIGH (ref 26.0–34.0)
MCHC: 33.9 g/dL (ref 30.0–36.0)
MCV: 100.9 fL — ABNORMAL HIGH (ref 80.0–100.0)
Monocytes Absolute: 0.4 10*3/uL (ref 0.1–1.0)
Monocytes Relative: 16 %
Neutro Abs: 1.7 10*3/uL (ref 1.7–7.7)
Neutrophils Relative %: 63 %
Platelets: 89 10*3/uL — ABNORMAL LOW (ref 150–400)
RBC: 3.27 MIL/uL — ABNORMAL LOW (ref 3.87–5.11)
RDW: 14.6 % (ref 11.5–15.5)
WBC: 2.7 10*3/uL — ABNORMAL LOW (ref 4.0–10.5)
nRBC: 0 % (ref 0.0–0.2)

## 2020-01-05 LAB — BODY FLUID CELL COUNT WITH DIFFERENTIAL
Eos, Fluid: 0 %
Lymphs, Fluid: 42 %
Monocyte-Macrophage-Serous Fluid: 55 % (ref 50–90)
Neutrophil Count, Fluid: 3 % (ref 0–25)
Total Nucleated Cell Count, Fluid: 84 cu mm (ref 0–1000)

## 2020-01-05 LAB — BASIC METABOLIC PANEL
Anion gap: 8 (ref 5–15)
BUN: 27 mg/dL — ABNORMAL HIGH (ref 8–23)
CO2: 28 mmol/L (ref 22–32)
Calcium: 8.4 mg/dL — ABNORMAL LOW (ref 8.9–10.3)
Chloride: 104 mmol/L (ref 98–111)
Creatinine, Ser: 0.97 mg/dL (ref 0.44–1.00)
GFR calc Af Amer: >60 mL/min (ref >60)
GFR calc non Af Amer: 55 mL/min — ABNORMAL LOW (ref >60)
Glucose, Bld: 92 mg/dL (ref 70–99)
Potassium: 4.2 mmol/L (ref 3.5–5.1)
Sodium: 140 mmol/L (ref 135–145)

## 2020-01-05 LAB — GLUCOSE, PLEURAL OR PERITONEAL FLUID: Glucose, Fluid: 119 mg/dL

## 2020-01-05 LAB — CHOLESTEROL, BODY FLUID: Cholesterol, Fluid: 25 mg/dL

## 2020-01-05 LAB — LACTATE DEHYDROGENASE, PLEURAL OR PERITONEAL FLUID: LD, Fluid: 46 U/L — ABNORMAL HIGH (ref 3–23)

## 2020-01-05 LAB — BODY FLUID CULTURE
Culture: NO GROWTH
Gram Stain: NONE SEEN

## 2020-01-05 LAB — PROTEIN, PLEURAL OR PERITONEAL FLUID: Total protein, fluid: <3 g/dL

## 2020-01-05 LAB — LACTATE DEHYDROGENASE: LDH: 138 U/L (ref 98–192)

## 2020-01-05 MED ORDER — LIDOCAINE HCL 1 % IJ SOLN
INTRAMUSCULAR | Status: AC
  Filled 2020-01-05: qty 20

## 2020-01-05 NOTE — Progress Notes (Signed)
Physical Therapy Treatment Patient Details Name: Erica Escobar MRN: [id number] DOB: 01/17/1936 Today's Date: 01/05/2020    History of Present Illness 84 y.o. female with medical history significant of hypotention, hyperlipidemia, COPD, nonalcoholic liver cirrhosis, non-Hodgkin's lymphoma, colon cancer status post right colectomy in 02/2015, CKD stage III, anemia, and GERD admitted with shortness of breath. Dx of acute respiratory failure, R pleural effusion s/p thoracentesis, cirrhosis, ascites.    PT Comments    Pt in bathroom with NT.  General transfer comment: light assist due to c/o dizzness.  Initial standing BP 89/62 but increased to 105/68 after amb 5 feet.  "I get like that" stated pt.  "I get dizzy when I go to move and even when I lay down".  Stated pt as she was coming out of the bathroom. General Gait Details: only tolerated amb 9 feet from bathroom back to bed due to c/o dizziness. Assisted back to bed per pt request.  Mild dyspnea with activity RA was 96. "I feel like my lungs are building back up" with fluid.   Pt plans to D/C back home with help from son as prior.   Follow Up Recommendations  Home health PT     Equipment Recommendations  None recommended by PT    Recommendations for Other Services       Precautions / Restrictions Precautions Precautions: Fall Precaution Comments: multiple falls and positional dizziness Restrictions Weight Bearing Restrictions: No    Mobility  Bed Mobility Overal bed mobility: Needs Assistance Bed Mobility: Sit to Supine       Sit to supine: Supervision;Min guard   General bed mobility comments: pt self able to get back into bed  Transfers Overall transfer level: Needs assistance Equipment used: Rolling walker (2 wheeled) Transfers: Sit to/from [facility] Sit to Stand: Supervision;Min guard Stand pivot transfers: Supervision;Min guard       General transfer comment: light assist due to c/o dizzness.   Initial standing BP 89/62 but increased to 105/68 after amb 5 feet.  "I get like that" stated pt.  "I get dizzy when I go to move and even when I lay down".  Stated pt as she was coming out of the bathroom.  Ambulation/Gait Ambulation/Gait assistance: Supervision;Min guard Gait Distance (Feet): 9 Feet Assistive device: Rolling walker (2 wheeled) Gait Pattern/deviations: Step-through pattern;Decreased stride length Gait velocity: decr   General Gait Details: only tolerated amb 9 feet from bathroom back to bed due to c/o dizziness.   Stairs             Wheelchair Mobility    Modified Rankin (Stroke Patients Only)       Balance                                            Cognition Arousal/Alertness: Awake/alert Behavior During Therapy: WFL for tasks assessed/performed Overall Cognitive Status: Within Functional Limits for tasks assessed                                 General Comments: so sweet      Exercises      General Comments        Pertinent Vitals/Pain Pain Assessment: No/denies pain    Home Living  Prior Function            PT Goals (current goals can now be found in the care plan section) Progress towards PT goals: Progressing toward goals    Frequency    Min 3X/week      PT Plan Current plan remains appropriate    Co-evaluation              AM-PAC PT "6 Clicks" Mobility   Outcome Measure  Help needed turning from your back to your side while in a flat bed without using bedrails?: None Help needed moving from lying on your back to sitting on the side of a flat bed without using bedrails?: A Little Help needed moving to and from a bed to a chair (including a wheelchair)?: A Little Help needed standing up from a chair using your arms (e.g., wheelchair or bedside chair)?: A Little Help needed to walk in hospital room?: A Little Help needed climbing 3-5 steps with a  railing? : A Little 6 Click Score: 19    End of Session Equipment Utilized During Treatment: Gait belt Activity Tolerance: Patient tolerated treatment well Patient left: in bed;with call bell/phone within reach;with bed alarm set Nurse Communication: Mobility status PT Visit Diagnosis: Difficulty in walking, not elsewhere classified (R26.2)     Time: 0940-1005 PT Time Calculation (min) (ACUTE ONLY): 25 min  Charges:  $Gait Training: 8-22 mins $Therapeutic Activity: 8-22 mins                     Rica Koyanagi  PTA Acute  Rehabilitation Services Pager      [phone] Office      [phone]

## 2020-01-05 NOTE — Progress Notes (Signed)
Occupational Therapy Treatment Patient Details Name: Erica Escobar MRN: [id number] DOB: February 26, 1936 Today's Date: 01/05/2020    History of present illness 84 y.o. female with medical history significant of hypotention, hyperlipidemia, COPD, nonalcoholic liver cirrhosis, non-Hodgkin's lymphoma, colon cancer status post right colectomy in 02/2015, CKD stage III, anemia, and GERD admitted with shortness of breath. Dx of acute respiratory failure, R pleural effusion s/p thoracentesis, cirrhosis, ascites.   OT comments  Pt will need 24/7 A at home or SNF  Follow Up Recommendations  SNF;Home health OT;Supervision/Assistance - 24 hour    Equipment Recommendations  None recommended by OT    Recommendations for Other Services      Precautions / Restrictions Precautions Precautions: Fall Precaution Comments: multiple falls Restrictions Weight Bearing Restrictions: No       Mobility Bed Mobility Overal bed mobility: Needs Assistance Bed Mobility: Supine to Sit     Supine to sit: Min assist Sit to supine: Supervision;Min guard   General bed mobility comments: .  Transfers Overall transfer level: Needs assistance Equipment used: Rolling walker (2 wheeled) Transfers: Sit to/from [facility] Sit to Stand: Min assist Stand pivot transfers: Min assist       General transfer comment: .    Balance Overall balance assessment: Needs assistance;History of Falls Sitting-balance support: Feet supported;Single extremity supported Sitting balance-Leahy Scale: Good       Standing balance-Leahy Scale: Fair                             ADL either performed or assessed with clinical judgement   ADL Overall ADL's : Needs assistance/impaired     Grooming: Minimal assistance;Standing               Lower Body Dressing: Sit to/from stand;Sitting/lateral leans;Minimal assistance   Toilet Transfer: Moderate assistance;RW;BSC;Cueing for  safety;Stand-pivot   Toileting- Clothing Manipulation and Hygiene: Sit to/from stand;Cueing for safety;Cueing for compensatory techniques;Cueing for sequencing;Minimal assistance         General ADL Comments: pt will need A with ADL activity at home.     Vision Baseline Vision/History: No visual deficits Patient Visual Report: No change from baseline            Cognition Arousal/Alertness: Awake/alert Behavior During Therapy: WFL for tasks assessed/performed Overall Cognitive Status: Within Functional Limits for tasks assessed                                 General Comments: .                   Pertinent Vitals/ Pain       Pain Assessment: No/denies pain     Prior Functioning/Environment              Frequency  Min 2X/week        Progress Toward Goals  OT Goals(current goals can now be found in the care plan section)  Progress towards OT goals: Progressing toward goals     Plan Discharge plan remains appropriate       AM-PAC OT "6 Clicks" Daily Activity     Outcome Measure   Help from another person eating meals?: A Little Help from another person taking care of personal grooming?: A Little Help from another person toileting, which includes using toliet, bedpan, or urinal?: A Little Help from another person bathing (including washing, rinsing, drying)?:  A Little Help from another person to put on and taking off regular upper body clothing?: A Little Help from another person to put on and taking off regular lower body clothing?: A Lot 6 Click Score: 17    End of Session Equipment Utilized During Treatment: Rolling walker;Gait belt  OT Visit Diagnosis: Unsteadiness on feet (R26.81);Repeated falls (R29.6);Muscle weakness (generalized) (M62.81);History of falling (Z91.81)   Activity Tolerance Patient limited by fatigue   Patient Left in chair;with call bell/phone within reach;with chair alarm set   Nurse Communication Mobility  status        Time: 1253-1310 OT Time Calculation (min): 17 min  Charges: OT General Charges $OT Visit: 1 Visit OT Treatments $Self Care/Home Management : 8-22 mins  Kari Baars, [place] [phone] Office- [phone]      [place], Edwena Felty D 01/05/2020, 2:24 PM

## 2020-01-05 NOTE — Progress Notes (Signed)
Subjective: No complaints.  Feeling well.  Objective: Vital signs in last 24 hours: Temp:  [97.9 F (36.6 C)-99.1 F (37.3 C)] 97.9 F (36.6 C) (01/22 0616) Pulse Rate:  [79-92] 79 (01/22 0616) Resp:  [18] 18 (01/22 0616) BP: (121-134)/(69-81) 134/81 (01/22 0616) SpO2:  [94 %-100 %] 100 % (01/22 0616) Weight:  [62.4 kg] 62.4 kg (01/22 0616) Last BM Date: 01/04/20  Intake/Output from previous day: 01/21 0701 - 01/22 0700 In: 483 [P.O.:480; I.V.:3] Out: -  Intake/Output this shift: No intake/output data recorded.  General appearance: alert and no distress Resp: Air fluid level in the lower half of the right lung field Cardio: regular rate and rhythm GI: soft, non-tender; bowel sounds normal; no masses,  no organomegaly Extremities: extremities normal, atraumatic, no cyanosis or edema  Lab Results: Recent Labs    01/04/20 0600 01/05/20 0545  WBC 3.0* 2.7*  HGB 11.1* 11.2*  HCT 33.4* 33.0*  PLT 104* 89*   BMET Recent Labs    01/03/20 0506 01/04/20 0600 01/05/20 0545  NA 139 139 140  K 3.4* 3.8 4.2  CL 103 101 104  CO2 28 28 28   GLUCOSE 97 89 92  BUN 25* 27* 27*  CREATININE 1.10* 0.92 0.97  CALCIUM 8.2* 8.3* 8.4*   LFT Recent Labs    01/03/20 0506  PROT 4.8*  ALBUMIN 2.8*  AST 44*  ALT 37  ALKPHOS 235*  BILITOT 1.8*   PT/INR No results for input(s): LABPROT, INR in the last 72 hours. Hepatitis Panel No results for input(s): HEPBSAG, HCVAB, HEPAIGM, HEPBIGM in the last 72 hours. C-Diff No results for input(s): CDIFFTOX in the last 72 hours. Fecal Lactopherrin No results for input(s): FECLLACTOFRN in the last 72 hours.  Studies/Results: DG CHEST PORT 1 VIEW  Result Date: 01/04/2020 CLINICAL DATA:  84 year old female with history of pleural effusion. EXAM: PORTABLE CHEST 1 VIEW COMPARISON:  Chest x-ray 01/02/2020. FINDINGS: Large right pleural effusion with associated areas of probable passive atelectasis in the dependent portions of the right  lung. No left pleural effusion. No definite consolidative airspace disease in the left lung. Airspace consolidation in the right base is not excluded. No evidence of pulmonary edema. Heart size is normal. Upper mediastinal contours are within normal limits. IMPRESSION: 1. Enlarging large right pleural effusion with areas of atelectasis and/or consolidation in the base of the right lung. Electronically Signed   By: Vinnie Langton M.D.   On: 01/04/2020 08:46    Medications:  Scheduled: . enoxaparin (LOVENOX) injection  40 mg Subcutaneous Q24H  . feeding supplement (ENSURE ENLIVE)  237 mL Oral BID BM  . fludrocortisone  200 mcg Oral Daily  . furosemide  40 mg Oral Daily  . midodrine  2.5 mg Oral BID WC  . pantoprazole  40 mg Oral Daily  . potassium chloride  40 mEq Oral BID  . sodium chloride flush  3 mL Intravenous Q12H  . spironolactone  100 mg Oral Daily  . sucralfate  1 g Oral TID WC & HS  . vitamin B-12  1,000 mcg Oral Daily   Continuous:   Assessment/Plan: 1) Decompensated cirrhosis. 2) Hepatic hydrothorax.   The CXR yesterday showed that there was an interval progression of her right pleural effusion, but clinically, she has markedly improved.  Her oxygen saturation is between 94-100% on room air.  She denies any issues with SOB.  With percussion there is an air fluid level in the lower half of the right lung field, but  there is still some mild dullness in the mid to upper right lung fields.  Her lower extremities exhibit wrinkling of her skin and her lower extremity edema has almost dissipated.  Electrolytes and creatinine are normal.  Her weight did not change from yesterday.  It remains stable at 62.8 kg.  Plan: 1) Okay to thoracentesis. 2) She will be maintained on Step I diuretics.  Lasix 40 mg QD and Spironolactone 100 mg QD. 3) 2 gram sodium diet. 4) Currently she can be managed as an outpatient.  Okay to D/C home the next day after the thoracentesis. 5) Follow up in the  office in one week.  LOS: 4 days   Erica Escobar D 01/05/2020, 9:26 AM

## 2020-01-05 NOTE — TOC Initial Note (Addendum)
Transition of Care [facility]) - Initial/Assessment Note    Patient Details  Name: Erica Escobar MRN: [id number] Date of Birth: 1936-09-03  Transition of Care [facility]) CM/SW Contact:    Trish Mage, LCSW Phone Number: 01/05/2020, 3:41 PM  Clinical Narrative:  Ms Declercq is here for acute respiratory failure with hypoxia, is also an oncology patient. She lives alone, has neighbors who check in on her daily, and states she does not want to go back to rehab, citing a traumatic experience related to her stay at [facility] in the past. Although hard of hearing, she relates her desires clearly, and gives me permission to call son.  Mr Susann Givens supports her decision to return home.  In response to my concerns of her living alone, he states he will rally neighbors to look in on her regularly, and will stop by daily after work himself. Both [facility] and [facility] have turned her down for [facility] PT/OT. As did [facility]. TOC will continue to follow during the course of hospitalization.                 Expected Discharge Plan: [place] Barriers to Discharge: No Barriers Identified   Patient Goals and CMS Choice Patient states their goals for this hospitalization and ongoing recovery are:: "I want to go back home." CMS Medicare.gov Compare Post Acute Care list provided to:: Patient Choice offered to / list presented to : Patient  Expected Discharge Plan and Services Expected Discharge Plan: [place]   Discharge Planning Services: CM Consult Post Acute Care Choice: [place] arrangements for the past 2 months: Single Family Home                                      Prior Living Arrangements/Services Living arrangements for the past 2 months: Single Family Home Lives with:: Self Patient language and need for interpreter reviewed:: Yes Do you feel safe going back to the place where you live?: Yes      Need for Family Participation in Patient Care: Yes  (Comment) Care giver support system in place?: Yes (comment) Current home services: DME Criminal Activity/Legal Involvement Pertinent to Current Situation/Hospitalization: No - Comment as needed  Activities of Daily Living Home Assistive Devices/Equipment: Eyeglasses, Dentures (specify type), Bedside commode/3-in-1, Wheelchair, Environmental consultant (specify type), Other (Comment)(ramp entrance, handicap toilet, tub/shower unit, front wheeled walker, 4 wheeled walker) ADL Screening (condition at time of admission) Patient's cognitive ability adequate to safely complete daily activities?: Yes Is the patient deaf or have difficulty hearing?: Yes(HOH) Does the patient have difficulty seeing, even when wearing glasses/contacts?: No Does the patient have difficulty concentrating, remembering, or making decisions?: No Patient able to express need for assistance with ADLs?: Yes Does the patient have difficulty dressing or bathing?: Yes(secondary to shortness of breath) Independently performs ADLs?: No(secondary to shortness of breath) Communication: Independent Dressing (OT): Needs assistance Is this a change from baseline?: Change from baseline, expected to last >3 days Grooming: Needs assistance Is this a change from baseline?: Change from baseline, expected to last >3 days Feeding: Needs assistance Is this a change from baseline?: Change from baseline, expected to last >3 days Bathing: Needs assistance Is this a change from baseline?: Change from baseline, expected to last >3 days Toileting: Needs assistance Is this a change from baseline?: Change from baseline, expected to last >3days In/Out Bed: Needs assistance Is  this a change from baseline?: Change from baseline, expected to last >3 days Walks in Home: Needs assistance Is this a change from baseline?: Change from baseline, expected to last >3 days Does the patient have difficulty walking or climbing stairs?: Yes(secondary to shortness of breath and  weakness) Weakness of Legs: Both Weakness of Arms/Hands: None  Permission Sought/Granted Permission sought to share information with : Family Supports Permission granted to share information with : Yes, Verbal Permission Granted  Share Information with NAME: Alveda Reasons     Permission granted to share info w Relationship: son  Permission granted to share info w Contact Information: [phone]  Emotional Assessment Appearance:: Appears stated age Attitude/Demeanor/Rapport: Engaged Affect (typically observed): Appropriate Orientation: : Oriented to Self, Oriented to Place, Oriented to Situation Alcohol / Substance Use: Not Applicable Psych Involvement: No (comment)  Admission diagnosis:  Anasarca [R60.1] Pleural effusion [J90] Volume overload [E87.70] Acute respiratory failure with hypoxia ([place]) [J96.01] Patient Active Problem List   Diagnosis Date Noted  . Anasarca 01/01/2020  . DNR (do not resuscitate) 01/01/2020  . Recurrent right pleural effusion   . Hepatic cirrhosis ([place])   . Acute respiratory failure with hypoxia ([place]) 10/12/2019  . Pleural effusion on right 10/12/2019  . Cirrhosis of liver with ascites ([place]) 10/12/2019  . Pancytopenia ([place]) 10/12/2019  . Malnutrition of moderate degree 08/30/2018  . Weakness 08/30/2018  . Symptomatic anemia 08/29/2018  . Acute kidney injury ([place]) 08/14/2018  . Dehydration 08/14/2018  . FTT (failure to thrive) in adult 08/14/2018  . Pressure injury of skin 08/14/2018  . Acute back pain 07/23/2018  . CKD (chronic kidney disease), stage III 07/23/2018  . Anorexia 07/23/2018  . Hyperlipidemia 07/23/2018  . 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 Metastatic cancer ([place]) 07/12/2018  . Chronic back pain 07/12/2018  . Pathologic rib fracture, initial encounter 07/12/2018  . Chest wall mass 07/12/2018  . Cancer of ascending colon s/p robotic colectomy 02/15/2015 02/15/2015  . Restless leg syndrome   . Depression   . Insomnia   .  COPD (chronic obstructive pulmonary disease) ([place])   . GERD (gastroesophageal reflux disease)    PCP:  Deland Pretty, MD Pharmacy:   [facility] [address], [place] [address] Phone: [phone] Fax: [place], [place] [address] [place] [address] Phone: [phone] Fax: [phone]  [id number] [id number]: [phone]: [phone]    Social Determinants of Health (SDOH) Interventions    Readmission Risk Interventions No flowsheet data found.

## 2020-01-05 NOTE — Procedures (Signed)
Ultrasound-guided diagnostic and therapeutic right thoracentesis performed yielding 1.8 liters of yellow fluid. No immediate complications. Follow-up chest x-ray pending.  A portion of the fluid was submitted to the lab for preordered studies. EBL none. Due to patient coughing only the above amount of fluid was removed today.

## 2020-01-05 NOTE — Progress Notes (Signed)
PROGRESS NOTE    Erica Escobar  AUQ:[id number] DOB: 10/31/1936 DOA: 01/01/2020 PCP: Deland Pretty, MD    Brief Narrative:  84 y.o. female with medical history significant of hypotention, hyperlipidemia, COPD, nonalcoholic liver cirrhosis, non-Hodgkin's lymphoma, colon cancer status post right colectomy in 02/2015, CKD stage III, anemia, and GERD.  She presents with complaints of shortness of breath with any kind of exertion along with abdominal distention and chest pain. Other associated symptoms include a dry nonproductive cough and bilateral lower extremity edema. Of note she was only  taking furosemide and spironolactone only if systolic BP was greater than 110.  Her daughter-in-law helps arrange her pills and notes that she takes those medicines may be 3 times a week due to her issues with low blood pressures.  She is being followed by Dr. [facility] in the outpatient setting. En route with EMS patient was noted to be hypoxic into the 70s on room air and improved after being placed on a nonrebreather. In the ED, Chest x-ray revealed complete opacification of the right lung field. TRH called to admit.  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 in the setting of liver cirrhosis Complete opacification of the right lung on chest x-ray on presentation  Pt s/p thoracentesis by PCCM on 01/01/20 with drainage of 2400 ml of clear pleural fluid with clinical improvement Repeat CXR showed enlarging large  right pleural effusion with areas of atelectasis and/or consolidation in base of right lung Plan for another thoracentesis on 01/05/2020 by IR GI consulted, recommend lasix 40 mg, spironolactone 159m Continue daily potassium supplements, 2 g sodium diet Strict I's and O's, daily weights Daily BMP  COPD Continue Albuterol nebulizer as needed  Elevated lipase Lipase elevated to 89  Abd UKoreareviewed, findings of mod ascites with splenomegaly and small echogenic liver with nodular border consistent with cirrhosis  Chronic ?orthostatic hypotension Continue Midodrin, Florinef  History of non-Hodgkin's lymphoma and colon cancer Patient status post robotic right colectomy in 2016 She is followed by Dr. SBenay Spiceof oncology and her non-Hodgkin's lymphoma is thought to be in remission. Continue outpatient follow-up with Dr. SBenay Spiceas outpatient  Chronic kidney disease stage III Stable, slight elevation  Continue to monitor with diuresis Daily BMP  GERD Continue Carafate and Protonix as tolerated  Chronic Thrombocytopenia Suspect likely related to patient's history of cirrhosis. Daily CBC     DVT prophylaxis: Lovenox subq Code Status: DNR Family Communication: None at bedside Disposition Plan: Likely home  Consultants:   GI  PCCM  Procedures:   Thoracentesis 1/18  Antimicrobials: Anti-infectives (From admission, onward)   None      Subjective: Patient reports feeling okay, does get intermittently short of breath depending on what she does.  Denies any chest pain, abdominal pain, nausea/vomiting, fever/chills.  Objective: Vitals:   01/04/20 1338 01/04/20 2027 01/05/20 0616 01/05/20 1404  BP: 121/72 125/69 134/81 130/69  Pulse: 83 92 79 90  Resp: 18 18 18 18   Temp: 98.6 F (37 C) 99.1 F (37.3 C)  97.9 F (36.6 C) 98.3 F (36.8 C)  TempSrc: Oral Oral Oral Oral  SpO2: 96% 94% 100% 98%  Weight:   62.4 kg   Height:       No intake or output data in  the 24 hours ending 01/05/20 1558 Filed Weights   01/03/20 0800 01/04/20 0538 01/05/20 0616  Weight: 64.2 kg 63 kg 62.4 kg    Examination:  General: NAD, HOH  Cardiovascular: S1, S2 present  Respiratory:  Diminished breath sounds on the right  Abdomen: Soft, nontender, nondistended, bowel sounds present  Musculoskeletal: 2+ bilateral pedal edema noted  Skin: Normal  Psychiatry: Normal mood   Data Reviewed: I have personally reviewed following labs and imaging studies  CBC: Recent Labs  Lab 01/01/20 1141 01/02/20 0523 01/04/20 0600 01/05/20 0545  WBC 4.6 5.1 3.0* 2.7*  NEUTROABS 2.8  --  1.8 1.7  HGB 12.7 11.0* 11.1* 11.2*  HCT 38.1 32.5* 33.4* 33.0*  MCV 100.8* 99.7 101.5* 100.9*  PLT 122* 105* 104* 89*   Basic Metabolic Panel: Recent Labs  Lab 01/01/20 1141 01/02/20 0523 01/03/20 0506 01/04/20 0600 01/05/20 0545  NA 140 140 139 139 140  K 3.8 4.1 3.4* 3.8 4.2  CL 105 104 103 101 104  CO2 23 28 28 28 28   GLUCOSE 126* 115* 97 89 92  BUN 15 16 25* 27* 27*  CREATININE 1.03* 1.00 1.10* 0.92 0.97  CALCIUM 8.6* 8.7* 8.2* 8.3* 8.4*   GFR: Estimated Creatinine Clearance: 38.5 mL/min (by C-G formula based on SCr of 0.97 mg/dL). Liver Function Tests: Recent Labs  Lab 01/01/20 1141 01/02/20 0523 01/03/20 0506  AST 59* 41 44*  ALT 37 35 37  ALKPHOS 323* 280* 235*  BILITOT 2.1* 1.6* 1.8*  PROT 5.7* 5.1* 4.8*  ALBUMIN 3.2* 2.9* 2.8*   Recent Labs  Lab 01/01/20 1141  LIPASE 89*   No results for input(s): AMMONIA in the last 168 hours. Coagulation Profile: Recent Labs  Lab 01/01/20 1141  INR 1.0   Cardiac Enzymes: No results for input(s): CKTOTAL, CKMB, CKMBINDEX, TROPONINI in the last 168 hours. BNP (last 3 results) No results for input(s): PROBNP in the last 8760 hours. HbA1C: No results for input(s): HGBA1C in the last 72 hours. CBG: No results for input(s): GLUCAP in the last 168 hours. Lipid Profile: No results for input(s): CHOL, HDL,  LDLCALC, TRIG, CHOLHDL, LDLDIRECT in the last 72 hours. Thyroid Function Tests: No results for input(s): TSH, T4TOTAL, FREET4, T3FREE, THYROIDAB in the last 72 hours. Anemia Panel: No results for input(s): VITAMINB12, FOLATE, FERRITIN, TIBC, IRON, RETICCTPCT in the last 72 hours. Sepsis Labs: No results for input(s): PROCALCITON, LATICACIDVEN in the last 168 hours.  Recent Results (from the past 240 hour(s))  Respiratory Panel by RT PCR (Flu A&B, Covid) - Nasopharyngeal Swab     Status: None   Collection Time: 01/01/20 11:18 AM   Specimen: Nasopharyngeal Swab  Result Value Ref Range Status   SARS Coronavirus 2 by RT PCR NEGATIVE NEGATIVE Final    Comment: (NOTE) SARS-CoV-2 target nucleic acids are NOT DETECTED. The SARS-CoV-2 RNA is generally detectable in upper respiratoy specimens during the acute phase of infection. The lowest concentration of SARS-CoV-2 viral copies this assay can detect is 131 copies/mL. A negative result does not preclude SARS-Cov-2 infection and should not be used as the sole basis for treatment or other patient management decisions. A negative result may occur with  improper specimen collection/handling, submission of specimen other than nasopharyngeal swab,  presence of viral mutation(s) within the areas targeted by this assay, and inadequate number of viral copies (<131 copies/mL). A negative result must be combined with clinical observations, patient history, and epidemiological information. The expected result is Negative. Fact Sheet for Patients:  PinkCheek.be Fact Sheet for Healthcare Providers:  GravelBags.it This test is not yet ap proved or cleared by the [place] FDA and  has been authorized for detection and/or diagnosis of SARS-CoV-2 by FDA under an Emergency Use Authorization (EUA). This EUA will remain  in effect (meaning this test can be used) for the duration of the COVID-19  declaration under Section 564(b)(1) of the Act, 21 U.S.C. section 360bbb-3(b)(1), unless the authorization is terminated or revoked sooner.    Influenza A by PCR NEGATIVE NEGATIVE Final   Influenza B by PCR NEGATIVE NEGATIVE Final    Comment: (NOTE) The Xpert Xpress SARS-CoV-2/FLU/RSV assay is intended as an aid in  the diagnosis of influenza from Nasopharyngeal swab specimens and  should not be used as a sole basis for treatment. Nasal washings and  aspirates are unacceptable for Xpert Xpress SARS-CoV-2/FLU/RSV  testing. Fact Sheet for Patients: PinkCheek.be Fact Sheet for Healthcare Providers: GravelBags.it This test is not yet approved or cleared by the [place] FDA and  has been authorized for detection and/or diagnosis of SARS-CoV-2 by  FDA under an Emergency Use Authorization (EUA). This EUA will remain  in effect (meaning this test can be used) for the duration of the  Covid-19 declaration under Section 564(b)(1) of the Act, 21  U.S.C. section 360bbb-3(b)(1), unless the authorization is  terminated or revoked. Performed at [facility], [place] [address] [place], [place] [address]   Body fluid culture (includes gram stain)     Status: None   Collection Time: 01/01/20  3:40 PM   Specimen: Pleural Fluid  Result Value Ref Range Status   Specimen Description   Final    FLUID PLEURAL Performed at [facility], [place] [address], [place], [place] [address]    Special Requests   Final    NONE Performed at [facility], [place] [address] [place], [place] [address]    Gram Stain NO WBC SEEN NO ORGANISMS SEEN   Final   Culture   Final    NO GROWTH Performed at [facility], [place] [address], [place],  [address]    Report Status 01/05/2020 FINAL  Final     Radiology Studies: DG CHEST PORT 1 VIEW  Result Date: 01/04/2020 CLINICAL DATA:  84 year old female with  history of pleural effusion. EXAM: PORTABLE CHEST 1 VIEW COMPARISON:  Chest x-ray 01/02/2020. FINDINGS: Large right pleural effusion with associated areas of probable passive atelectasis in the dependent portions of the right lung. No left pleural effusion. No definite consolidative airspace disease in the left lung. Airspace consolidation in the right base is not excluded. No evidence of pulmonary edema. Heart size is normal. Upper mediastinal contours are within normal limits. IMPRESSION: 1. Enlarging large right pleural effusion with areas of atelectasis and/or consolidation in the base of the right lung. Electronically Signed   By: Vinnie Langton M.D.   On: 01/04/2020 08:46    Scheduled Meds: . enoxaparin (LOVENOX) injection  40 mg Subcutaneous Q24H  . feeding supplement (ENSURE ENLIVE)  237 mL Oral BID BM  . fludrocortisone  200 mcg Oral Daily  . furosemide  40 mg Oral Daily  . midodrine  2.5 mg Oral BID WC  .  pantoprazole  40 mg Oral Daily  . potassium chloride  40 mEq Oral BID  . sodium chloride flush  3 mL Intravenous Q12H  . spironolactone  100 mg Oral Daily  . sucralfate  1 g Oral TID WC & HS  . vitamin B-12  1,000 mcg Oral Daily   Continuous Infusions:   LOS: 4 days   Alma Friendly, MD [facility] Pager On Amion  If 7PM-7AM, please contact night-coverage 01/05/2020, 3:58 PM

## 2020-01-06 LAB — BASIC METABOLIC PANEL
Anion gap: 7 (ref 5–15)
BUN: 28 mg/dL — ABNORMAL HIGH (ref 8–23)
CO2: 30 mmol/L (ref 22–32)
Calcium: 8.6 mg/dL — ABNORMAL LOW (ref 8.9–10.3)
Chloride: 101 mmol/L (ref 98–111)
Creatinine, Ser: 1.13 mg/dL — ABNORMAL HIGH (ref 0.44–1.00)
GFR calc Af Amer: 53 mL/min — ABNORMAL LOW (ref >60)
GFR calc non Af Amer: 46 mL/min — ABNORMAL LOW (ref >60)
Glucose, Bld: 94 mg/dL (ref 70–99)
Potassium: 4.4 mmol/L (ref 3.5–5.1)
Sodium: 138 mmol/L (ref 135–145)

## 2020-01-06 LAB — CBC WITH DIFFERENTIAL/PLATELET
Abs Immature Granulocytes: 0.01 10*3/uL (ref 0.00–0.07)
Basophils Absolute: 0 10*3/uL (ref 0.0–0.1)
Basophils Relative: 0 %
Eosinophils Absolute: 0.1 10*3/uL (ref 0.0–0.5)
Eosinophils Relative: 3 %
HCT: 32.8 % — ABNORMAL LOW (ref 36.0–46.0)
Hemoglobin: 11 g/dL — ABNORMAL LOW (ref 12.0–15.0)
Immature Granulocytes: 0 %
Lymphocytes Relative: 16 %
Lymphs Abs: 0.5 10*3/uL — ABNORMAL LOW (ref 0.7–4.0)
MCH: 33.6 pg (ref 26.0–34.0)
MCHC: 33.5 g/dL (ref 30.0–36.0)
MCV: 100.3 fL — ABNORMAL HIGH (ref 80.0–100.0)
Monocytes Absolute: 0.6 10*3/uL (ref 0.1–1.0)
Monocytes Relative: 18 %
Neutro Abs: 2 10*3/uL (ref 1.7–7.7)
Neutrophils Relative %: 63 %
Platelets: 102 10*3/uL — ABNORMAL LOW (ref 150–400)
RBC: 3.27 MIL/uL — ABNORMAL LOW (ref 3.87–5.11)
RDW: 14.6 % (ref 11.5–15.5)
WBC: 3.2 10*3/uL — ABNORMAL LOW (ref 4.0–10.5)
nRBC: 0 % (ref 0.0–0.2)

## 2020-01-06 MED ORDER — SPIRONOLACTONE 100 MG PO TABS: 100.0000 mg | ORAL_TABLET | Freq: Every day | ORAL | 0 refills | Status: DC

## 2020-01-06 MED ORDER — FUROSEMIDE 40 MG PO TABS: 40.0000 mg | ORAL_TABLET | Freq: Every day | ORAL | 0 refills | Status: AC

## 2020-01-06 MED ORDER — POTASSIUM CHLORIDE CRYS ER 20 MEQ PO TBCR: 40.0000 meq | EXTENDED_RELEASE_TABLET | Freq: Two times a day (BID) | ORAL | 0 refills | Status: DC

## 2020-01-06 NOTE — TOC Transition Note (Addendum)
Transition of Care [facility]) - CM/SW Discharge Note   Patient Details  Name: Erica Escobar MRN: [id number] Date of Birth: 08/05/1936  [facility]) CM/SW Contact:  Trish Mage, LCSW Phone Number: 01/06/2020, 1:36 PM   Clinical Narrative:  Patient to d/c today.  Continue to seek [facility] services.  Left messages for both [facility] and [facility] re services. Neither are able to take her.  However, [facility] [facility] is.  They will see her on Wed of next week.  Packet faxed to them.  No further needs. TOC sign off.      Barriers to Discharge: No Barriers Identified   Patient Goals and CMS Choice Patient states their goals for this hospitalization and ongoing recovery are:: "I want to go back home." CMS Medicare.gov Compare Post Acute Care list provided to:: Patient Choice offered to / list presented to : Patient  Discharge Placement                       Discharge Plan and Services   Discharge Planning Services: CM Consult Post Acute Care Choice: Home Health                               Social Determinants of Health (SDOH) Interventions     Readmission Risk Interventions No flowsheet data found.

## 2020-01-06 NOTE — Discharge Summary (Signed)
Discharge Summary  Erica Escobar WKM:[id number] DOB: Apr 26, 1936  PCP: Deland Pretty, MD  Admit date: 01/01/2020 Discharge date: 01/06/2020  Time spent: 40 mins   Recommendations for Outpatient Follow-up:  1. PCP in 1 week with repeat labs 2. GI, Dr. Benson Norway in 1 week  Discharge Diagnoses:  Active Hospital Problems   Diagnosis Date Noted  . Acute respiratory failure with hypoxia ([place]) 10/12/2019  . Anasarca 01/01/2020  . DNR (do not resuscitate) 01/01/2020  . Recurrent right pleural effusion   . Cirrhosis of liver with ascites ([place]) 10/12/2019  . CKD (chronic kidney disease), stage III 07/23/2018  . Cancer of ascending colon s/p robotic colectomy 02/15/2015 02/15/2015  . GERD (gastroesophageal reflux disease)   . COPD (chronic obstructive pulmonary disease) [facility])     Resolved Hospital Problems  No resolved problems to display.    Discharge Condition: Stable  Diet recommendation: 2 g sodium diet  Vitals:   01/05/20 1942 01/06/20 0547  BP: 113/67 126/71  Pulse: 93 86  Resp: 18 17  Temp: 98.7 F (37.1 C) 98.1 F (36.7 C)  SpO2: 94% 95%    History of present illness:  84 y.o.femalewith medical history significant ofhypotention, hyperlipidemia, COPD, nonalcoholic liver cirrhosis, non-Hodgkin's lymphoma, colon cancer status post right colectomyin 02/2015, CKD stage III, anemia, and GERD. She presents with complaints of shortness of breath with any kind of exertion along with abdominal distention and chest pain. Other associated symptoms include a dry nonproductive cough and bilateral lower extremity edema. Of note she was only taking furosemide andspironolactone onlyif systolic BPwas greater [id number]. Her daughter-in-law helps arrange her pills and notes that she takes those medicines may be 3 times a week due to her issues with low blood pressures. She is being followed by Dr. Benson Norway GI in the outpatient setting. Enroute with EMS patient was noted to be hypoxic into the  70s on room air and improvedafter being placed on a nonrebreather. In the ED, Chest x-ray revealed complete opacification of the right lung field. TR[facility] called to admit.    Today, patient denies any new complaints, eager to be discharged.  Discussed with son extensively, about patient needing to be checked on frequently as she lives alone and refusing to go back to rehab.  Son reported he will rally neighbors to look in on her regularly and will stop by daily after work.  Son advised to educate anyone coming into the house to wear mask with hand hygiene.  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  Follow-up chest x-ray with interval reduction, no pneumothorax, right lung basilar atelectasis GI consulted, recommend lasix 40 mg, spironolactone  100 mg, follow-up in 1 week Continue daily potassium supplements, 2 g sodium diet Follow-up with PCP in 1 week with repeat labs  COPD Continue Albuterol nebulizer as needed  Elevated lipase Lipase elevated to 89 Abd [place]  reviewed, findings of mod ascites with splenomegaly and small echogenic liver with nodular border consistent with cirrhosis  Chronic ?orthostatic hypotension Continue Midodrin, Florinef  History of non-Hodgkin's lymphoma and colon cancer Patient status post robotic right colectomy in 2016 She is followed by Dr. Velora Heckler oncology and her non-Hodgkin's lymphoma is thought to be in remission. Continueoutpatient follow-up with Dr. Benay Spice as outpatient  Chronic kidney disease stage III Stable, creatinine with slight elevation  Follow-up with PCP, GI in 1 week with repeat labs  GERD Continue Carafate and Protonix as tolerated  Chronic Thrombocytopenia Suspect likely related to patient's history of cirrhosis. Follow-up with GI           Malnutrition Type:  Nutrition Problem: Increased nutrient needs Etiology: chronic illness, cancer and cancer related treatments(cirrhosis)   Malnutrition Characteristics:  Signs/Symptoms: estimated needs   Nutrition Interventions:  Interventions: Ensure Enlive (each supplement provides 350kcal and 20 grams of protein), MVI   Estimated body mass index is 25.91 kg/m as calculated from the following:   Height as of this encounter: 5' 1"  (1.549 m).   Weight as of this encounter: 62.2 kg.    Procedures:  Thoracentesis x2  Consultations:  GI  IR  PCCM  Discharge Exam: BP 126/71 (BP Location: Right Arm)   Pulse 86   Temp 98.1 F (36.7 C) (Oral)   Resp 17   Ht 5' 1"  (1.549 m)   Wt 62.2 kg   SpO2 95%   BMI 25.91 kg/m   General: NAD Cardiovascular: S1, S2 present Respiratory: Diminished breath sounds on the right bases  Discharge Instructions You were cared for by a hospitalist during your hospital stay. If you have any questions about your discharge medications or the care you received while you were in the hospital after you are discharged, you can call the unit and asked to speak with the hospitalist on call  if the hospitalist that took care of you is not available. Once you are discharged, your primary care physician will handle any further medical issues. Please note that NO REFILLS for any discharge medications will be authorized once you are discharged, as it is imperative that you return to your primary care physician (or establish a relationship with a primary care physician if you do not have one) for your aftercare needs so that they can reassess your need for medications and monitor your lab values.   Allergies as of 01/06/2020      Reactions   Azithromycin Itching   Allopurinol Other (See Comments)   Unknown   Cephalexin Other (See Comments)   Unknown   Codeine Itching   Gabapentin Swelling, Other (See Comments)   Throat and leg swelling   Niaspan [niacin] Other (See Comments)   Unknown   Oxybutynin Other (See Comments)   "made me go crazy"-- had crazy dreams.    Paxil [paroxetine Hcl] Other (See Comments)   Unknown reaction   Penicillins Itching, Other (See Comments)   Has patient had a PCN reaction causing immediate rash, facial/tongue/throat swelling, SOB or lightheadedness with hypotension: No Has patient had a PCN reaction causing severe rash involving mucus membranes or skin necrosis: Yes Has patient had a PCN reaction that required hospitalization: No Has patient had a PCN reaction occurring within the  last 10 years: No If all of the above answers are "NO", then may proceed with Cephalosporin use.   Pentazocine Itching, Nausea Only, Other (See Comments)   Headache.    Tizanidine Other (See Comments)   "made me crazy"   Tramadol Other (See Comments)   Head feels "swimmy" and weakness   Vicodin [hydrocodone-acetaminophen] Other (See Comments)   "made me go crazy"      Medication List    STOP taking these medications   potassium chloride 20 MEQ packet Commonly known as: KLOR-CON   sucralfate 1 g tablet Commonly known as: CARAFATE     TAKE these medications     betamethasone valerate ointment 0.1 % Commonly known as: VALISONE Apply 1 application topically 2 (two) times daily as needed (Heat under breast).   calcium-vitamin D 250-125 MG-UNIT tablet Commonly known as: OSCAL WITH D Take 1 tablet by mouth daily.   Ensure Take 237-474 mLs by mouth daily. VANILLA OR STRAWBERRY IF AVAILABLE PO DAILY   Fish Oil 1000 MG Caps Take 2,000 mg by mouth daily with breakfast.   fludrocortisone 0.1 MG tablet Commonly known as: FLORINEF Take 200 mcg by mouth daily.   furosemide 40 MG tablet Commonly known as: LASIX Take 1 tablet (40 mg total) by mouth daily. Start taking on: January 07, 2020 What changed:   medication strength  how much to take  additional instructions   lactulose 10 GM/15ML solution Commonly known as: CHRONULAC Take 20 g by mouth daily as needed for mild constipation.   loratadine 10 MG tablet Commonly known as: CLARITIN Take 10 mg by mouth daily as needed for allergies.   midodrine 2.5 MG tablet Commonly known as: PROAMATINE Take 1 tablet (2.5 mg total) by mouth 2 (two) times daily with a meal.   multivitamin with minerals Tabs tablet Take 1 tablet by mouth daily.   ondansetron 4 MG tablet Commonly known as: Zofran Take 1 tablet (4 mg total) by mouth every 8 (eight) hours as needed for nausea or vomiting.   pantoprazole 40 MG tablet Commonly known as: PROTONIX Take 40 mg by mouth daily.   polyethylene glycol 17 g packet Commonly known as: MIRALAX / GLYCOLAX Take 17 g by mouth daily as needed for moderate constipation.   potassium chloride SA 20 MEQ tablet Commonly known as: KLOR-CON Take 2 tablets (40 mEq total) by mouth 2 (two) times daily for 14 days.   Refresh 1.4-0.6 % Soln Generic drug: Polyvinyl Alcohol-Povidone PF Place 1 drop into both eyes 3 (three) times daily as needed (dry eyes).   spironolactone 100 MG tablet Commonly known as: ALDACTONE Take 1 tablet (100 mg total) by mouth daily. Start  taking on: January 07, 2020 What changed:   medication strength  how much to take  when to take this  additional instructions   vitamin B-12 1000 MCG tablet Commonly known as: CYANOCOBALAMIN Take 1,000 mcg by mouth daily.   vitamin C 500 MG tablet Commonly known as: ASCORBIC ACID Take 500 mg by mouth daily.      Allergies  Allergen Reactions  . Azithromycin Itching  . Allopurinol Other (See Comments)    Unknown  . Cephalexin Other (See Comments)    Unknown  . Codeine Itching  . Gabapentin Swelling and Other (See Comments)    Throat and leg swelling  . Niaspan [Niacin] Other (See Comments)    Unknown  . Oxybutynin Other (See Comments)    "made me go crazy"-- had crazy dreams.   Marland Kitchen  Paxil [Paroxetine Hcl] Other (See Comments)    Unknown reaction  . Penicillins Itching and Other (See Comments)    Has patient had a PCN reaction causing immediate rash, facial/tongue/throat swelling, SOB or lightheadedness with hypotension: No Has patient had a PCN reaction causing severe rash involving mucus membranes or skin necrosis: Yes Has patient had a PCN reaction that required hospitalization: No Has patient had a PCN reaction occurring within the last 10 years: No If all of the above answers are "NO", then may proceed with Cephalosporin use.   Marland Kitchen Pentazocine Itching, Nausea Only and Other (See Comments)    Headache.   . Tizanidine Other (See Comments)    "made me crazy"  . Tramadol Other (See Comments)    Head feels "swimmy" and weakness  . Vicodin [Hydrocodone-Acetaminophen] Other (See Comments)    "made me go crazy"   Follow-up Information    Deland Pretty, MD. Schedule an appointment as soon as possible for a visit in 1 week(s).   Specialty: Internal Medicine Contact information: [address] [place] [address] [phone]        Carol Ada, MD. Schedule an appointment as soon as possible for a visit in 1 week(s).   Specialty:  Gastroenterology Contact information: [address] [place]  [address] [phone]            The results of significant diagnostics from this hospitalization (including imaging, microbiology, ancillary and laboratory) are listed below for reference.    Significant Diagnostic Studies: DG Chest 1 View  Result Date: 01/05/2020 CLINICAL DATA:  Post right-sided thoracentesis. EXAM: CHEST  1 VIEW COMPARISON:  Chest radiograph-01/04/2020; 01/02/2020 FINDINGS: Grossly unchanged cardiac silhouette and mediastinal contours with persistent partial obscuration right heart border secondary to residual small right-sided pleural effusion post thoracentesis. Improved aeration of the right upper and mid lung with persistent right basilar opacities, likely atelectasis. No pneumothorax. No new focal airspace opacities. A skin fold overlies the left costophrenic angle. No left-sided pneumothorax. No evidence of edema. No acute osseous IMPRESSION: 1. Interval reduction in persistent small right-sided effusion post thoracentesis. No pneumothorax. 2. Improved aeration the right lung with persistent right basilar opacities, likely atelectasis. Electronically Signed   By: Sandi Mariscal M.D.   On: 01/05/2020 17:39   US Abdomen Complete  Result Date: 01/02/2020 CLINICAL DATA:  Elevated lipase. EXAM: ABDOMEN ULTRASOUND COMPLETE COMPARISON:  Paracentesis ultrasound 10/13/2019. CT of the abdomen and pelvis with contrast 10/12/2019 FINDINGS: Gallbladder: Surgically absent Common bile duct: Diameter: 6.8 mm, within normal limits following cholecystectomy. Liver: Liver is diffusely echogenic. Nodular border is noted. No discrete lesions are present. Portal vein is patent on color Doppler imaging with normal direction of blood flow towards the liver. IVC: No abnormality visualized. Pancreas: Not well visualized due to overlying bowel gas. Spleen: Mildly enlarged at 14.0 cm maximally. No discrete lesions are  present. Right Kidney: Length: 9.3 cm. Echogenicity within normal limits. No mass or hydronephrosis visualized. Left Kidney: Length: 10.3 cm. Echogenicity within normal limits. No mass or hydronephrosis visualized. Abdominal aorta: No aneurysm visualized. Other findings: Moderate diffuse abdominal ascites are present. IMPRESSION: 1. Small echogenic liver with nodular border consistent with cirrhosis. 2. Splenomegaly 3. Moderate abdominal ascites. Electronically Signed   By: San Morelle M.D.   On: 01/02/2020 06:31   DG CHEST PORT 1 VIEW  Result Date: 01/04/2020 CLINICAL DATA:  84 year old female with history of pleural effusion. EXAM: PORTABLE CHEST 1 VIEW COMPARISON:  Chest x-ray 01/02/2020. FINDINGS: Large right  pleural effusion with associated areas of probable passive atelectasis in the dependent portions of the right lung. No left pleural effusion. No definite consolidative airspace disease in the left lung. Airspace consolidation in the right base is not excluded. No evidence of pulmonary edema. Heart size is normal. Upper mediastinal contours are within normal limits. IMPRESSION: 1. Enlarging large right pleural effusion with areas of atelectasis and/or consolidation in the base of the right lung. Electronically Signed   By: Vinnie Langton M.D.   On: 01/04/2020 08:46   DG CHEST PORT 1 VIEW  Result Date: 01/02/2020 CLINICAL DATA:  Pleural effusion. EXAM: PORTABLE CHEST 1 VIEW COMPARISON:  One-view chest x-ray 01/01/2020 FINDINGS: The heart size is normal. Progressive right pleural effusion and airspace disease is present. The left lung is clear. Lung volumes are low. IMPRESSION: Progressive right pleural effusion and airspace disease. While this may represent atelectasis, infection is not excluded. Electronically Signed   By: San Morelle M.D.   On: 01/02/2020 06:08   DG CHEST PORT 1 VIEW  Result Date: 01/01/2020 CLINICAL DATA:  Shortness of breath for 4 hours. EXAM: PORTABLE  CHEST 1 VIEW COMPARISON:  January 01, 2020 FINDINGS: Skin folds are seen over the lateral left chest. A right-sided pleural effusion with underlying opacity remains but is smaller in the interval. The cardiomediastinal silhouette is stable. No pneumothorax. No other interval changes. IMPRESSION: The right-sided pleural effusion with underlying opacity persists but is smaller in the interval. No other acute interval changes. Electronically Signed   By: Dorise Bullion III M.D   On: 01/01/2020 15:53   DG Chest Portable 1 View  Result Date: 01/01/2020 CLINICAL DATA:  Shortness of breath for 4 hours. Wheezing in the right lung. EXAM: PORTABLE CHEST 1 VIEW COMPARISON:  PA and lateral chest 12/25/2019. FINDINGS: Small right pleural effusion seen on the prior examination has markedly increased. The right chest is now almost completely opacified. The left lung is expanded and clear. No left effusion. Heart size is normal. No acute or focal bony abnormality. IMPRESSION: Near complete whiteout of the right chest is due to a large pleural effusion and airspace disease. The patient had only a small left effusion on the comparison exam. Electronically Signed   By: Inge Rise M.D.   On: 01/01/2020 11:38   IR THORACENTESIS ASP PLEURAL SPACE W/IMG GUIDE  Result Date: 01/05/2020 INDICATION: Patient with history of cirrhosis, COPD, non-Hodgkin's lymphoma, colon cancer, chronic kidney disease, recurrent right pleural effusion/hepatic hydrothorax; request made for diagnostic and therapeutic right thoracentesis. EXAM: ULTRASOUND GUIDED DIAGNOSTIC AND THERAPEUTIC RIGHT THORACENTESIS MEDICATIONS: None COMPLICATIONS: None immediate. PROCEDURE: An ultrasound guided thoracentesis was thoroughly discussed with the patient and questions answered. The benefits, risks, alternatives and complications were also discussed. The patient understands and wishes to proceed with the procedure. Written consent was obtained. Ultrasound was  performed to localize and mark an adequate pocket of fluid in the right chest. The area was then prepped and draped in the normal sterile fashion. 1% Lidocaine was used for local anesthesia. Under ultrasound guidance a 6 Fr Safe-T-Centesis catheter was introduced. Thoracentesis was performed. The catheter was removed and a dressing applied. FINDINGS: A total of approximately 1.8 liters of yellow fluid was removed. Samples were sent to the laboratory as requested by the clinical team. Due to patient coughing only the above amount of fluid was removed today. IMPRESSION: Successful ultrasound guided diagnostic and therapeutic right thoracentesis yielding 1.8 liters of pleural fluid. Read by: Rowe Robert, PA-C Electronically Signed  By: Sandi Mariscal M.D.   On: 01/05/2020 17:36    Microbiology: Recent Results (from the past 240 hour(s))  Respiratory Panel by RT PCR (Flu A&B, Covid) - Nasopharyngeal Swab     Status: None   Collection Time: 01/01/20 11:18 AM   Specimen: Nasopharyngeal Swab  Result Value Ref Range Status   SARS Coronavirus 2 by RT PCR NEGATIVE NEGATIVE Final    Comment: (NOTE) SARS-CoV-2 target nucleic acids are NOT DETECTED. The SARS-CoV-2 RNA is generally detectable in upper respiratoy specimens during the acute phase of infection. The lowest concentration of SARS-CoV-2 viral copies this assay can detect is 131 copies/mL. A negative result does not preclude SARS-Cov-2 infection and should not be used as the sole basis for treatment or other patient management decisions. A negative result may occur with  improper specimen collection/handling, submission of specimen other than nasopharyngeal swab, presence of viral mutation(s) within the areas targeted by this assay, and inadequate number of viral copies (<131 copies/mL). A negative result must be combined with clinical observations, patient history, and epidemiological information. The expected result is Negative. Fact Sheet for  Patients:  PinkCheek.be Fact Sheet for Healthcare Providers:  GravelBags.it This test is not yet ap proved or cleared by the [place] FDA and  has been authorized for detection and/or diagnosis of SARS-CoV-2 by FDA under an Emergency Use Authorization (EUA). This EUA will remain  in effect (meaning this test can be used) for the duration of the COVID-19 declaration under Section 564(b)(1) of the Act, 21 U.S.C. section 360bbb-3(b)(1), unless the authorization is terminated or revoked sooner.    Influenza A by PCR NEGATIVE NEGATIVE Final   Influenza B by PCR NEGATIVE NEGATIVE Final    Comment: (NOTE) The Xpert Xpress SARS-CoV-2/FLU/RSV assay is intended as an aid in  the diagnosis of influenza from Nasopharyngeal swab specimens and  should not be used as a sole basis for treatment. Nasal washings and  aspirates are unacceptable for Xpert Xpress SARS-CoV-2/FLU/RSV  testing. Fact Sheet for Patients: PinkCheek.be Fact Sheet for Healthcare Providers: GravelBags.it This test is not yet approved or cleared by the [place] FDA and  has been authorized for detection and/or diagnosis of SARS-CoV-2 by  FDA under an Emergency Use Authorization (EUA). This EUA will remain  in effect (meaning this test can be used) for the duration of the  Covid-19 declaration under Section 564(b)(1) of the Act, 21  U.S.C. section 360bbb-3(b)(1), unless the authorization is  terminated or revoked. Performed at [facility], [place] [address], [place], [place] [address]   Body fluid culture (includes gram stain)     Status: None   Collection Time: 01/01/20  3:40 PM   Specimen: Pleural Fluid  Result Value Ref Range Status   Specimen Description   Final    FLUID PLEURAL Performed at [facility], [place] [address], [place], [place] [address]    Special Requests   Final     NONE Performed at [facility], [place] [address], [place], [place] [address]    Gram Stain NO WBC SEEN NO ORGANISMS SEEN   Final   Culture   Final    NO GROWTH Performed at [facility], [place] [address] [place],  [address]    Report Status 01/05/2020 FINAL  Final  Body fluid culture     Status: None (Preliminary result)   Collection Time: 01/05/20  5:14 PM   Specimen: Lung, Right Lower Lobe; Pleural Fluid  Result Value Ref Range Status   Specimen Description   Final    PLEURAL RIGHT LOWER LOBE Performed at [place] [address] [place], [place] [address]    Special Requests   Final    NONE Performed at [facility], [place] [address], [place], [place] [address]    Gram Stain NO WBC SEEN NO ORGANISMS SEEN   Final   Culture   Final    NO GROWTH < 12 HOURS Performed at [facility], [place] [address] [place],  [address]    Report Status PENDING  Incomplete     Labs: Basic Metabolic Panel: Recent Labs  Lab 01/02/20 0523 01/03/20 0506 01/04/20 0600 01/05/20 0545 01/06/20 0639  NA 140 139 139 140 138  K 4.1 3.4* 3.8 4.2 4.4  CL 104 103 101 104 101  CO2 28 28 28 28 30   GLUCOSE 115* 97 89 92 94  BUN 16 25* 27* 27* 28*  CREATININE 1.00 1.10* 0.92 0.97 1.13*  CALCIUM 8.7* 8.2* 8.3* 8.4* 8.6*   Liver Function Tests: Recent Labs  Lab 01/01/20 1141 01/02/20 0523 01/03/20 0506  AST 59* 41 44*  ALT 37 35 37  ALKPHOS 323* 280* 235*  BILITOT 2.1* 1.6* 1.8*  PROT 5.7* 5.1* 4.8*  ALBUMIN 3.2* 2.9* 2.8*   Recent Labs  Lab 01/01/20 1141  LIPASE 89*   No results for input(s): AMMONIA in the last 168 hours. CBC: Recent Labs  Lab 01/01/20 1141 01/02/20 0523 01/04/20 0600 01/05/20 0545 01/06/20 0639  WBC 4.6 5.1 3.0* 2.7* 3.2*  NEUTROABS 2.8  --  1.8 1.7 2.0  HGB 12.7 11.0* 11.1* 11.2* 11.0*  HCT 38.1 32.5* 33.4* 33.0* 32.8*  MCV 100.8* 99.7 101.5* 100.9* 100.3*  PLT 122*  105* 104* 89* 102*   Cardiac Enzymes: No results for input(s): CKTOTAL, CKMB, CKMBINDEX, TROPONINI in the last 168 hours. BNP: BNP (last 3 results) Recent Labs    09/11/19 1631 10/12/19 1852 01/01/20 1141  BNP 73.2 57.7 74.7    ProBNP (last 3 results) No results for input(s): PROBNP in the last 8760 hours.  CBG: No results for input(s): GLUCAP in the last 168 hours.     Signed:  Alma Friendly, MD [facility] 01/06/2020, 11:20 AM

## 2020-01-06 NOTE — Progress Notes (Signed)
Physical Therapy Treatment Patient Details Name: Erica Escobar MRN: [id number] DOB: 05-Mar-1936 Today's Date: 01/06/2020    History of Present Illness 83 y.o. female with medical history significant of hypotention, hyperlipidemia, COPD, nonalcoholic liver cirrhosis, non-Hodgkin's lymphoma, colon cancer status post right colectomy in 02/2015, CKD stage III, anemia, and GERD admitted with shortness of breath. Dx of acute respiratory failure, R pleural effusion s/p thoracentesis, cirrhosis, ascites.    PT Comments    Pt feeling better after her Thoracentesis but stated she was hurting "a little from where they beat on you".  Assisted pt OOB to [facility] first.  General bed mobility comments: pt mose self able using rail and HOB elevated. General transfer comment: fisr assisted OOB to [facility] pt demonstared good safety cognition and use of hands to ste4 ady self to complete 1/4 pivot without any AD. General Gait Details: tolerated an increased distance before c/o fatigue.  Mild dyspnea.   Follow Up Recommendations  Home health PT     Equipment Recommendations  None recommended by PT    Recommendations for Other Services       Precautions / Restrictions Precautions Precautions: Fall Precaution Comments: multiple falls Restrictions Weight Bearing Restrictions: No    Mobility  Bed Mobility Overal bed mobility: Needs Assistance Bed Mobility: Supine to Sit     Supine to sit: Min guard;Min assist     General bed mobility comments: pt mose self able using rail and HOB elevated.  Transfers Overall transfer level: Needs assistance Equipment used: Rolling walker (2 wheeled);None Transfers: Sit to/from [employer] to Stand: Supervision;Min guard Stand pivot transfers: Min guard       General transfer comment: fisr assisted OOB to [facility] pt demonstared good safety cognition and use of hands to ste4ady self to complete 1/4 pivot without any  AD.  Ambulation/Gait Ambulation/Gait assistance: Supervision;Min guard Gait Distance (Feet): 35 Feet Assistive device: Rolling walker (2 wheeled) Gait Pattern/deviations: Step-through pattern;Decreased stride length Gait velocity: decr   General Gait Details: tolerated anincreased distance before c/o fatigue.  Mild dyspnea.   Stairs             Wheelchair Mobility    Modified Rankin (Stroke Patients Only)       Balance                                            Cognition Arousal/Alertness: Awake/alert Behavior During Therapy: WFL for tasks assessed/performed Overall Cognitive Status: Within Functional Limits for tasks assessed                                 General Comments: AxO x 4 and very bright but also HOH      Exercises      General Comments        Pertinent Vitals/Pain Pain Assessment: No/denies pain    Home Living                      Prior Function            PT Goals (current goals can now be found in the care plan section) Progress towards PT goals: Progressing toward goals    Frequency    Min 3X/week      PT Plan Current plan remains appropriate    Co-evaluation  AM-PAC PT "6 Clicks" Mobility   Outcome Measure  Help needed turning from your back to your side while in a flat bed without using bedrails?: None Help needed moving from lying on your back to sitting on the side of a flat bed without using bedrails?: A Little Help needed moving to and from a bed to a chair (including a wheelchair)?: A Little Help needed standing up from a chair using your arms (e.g., wheelchair or bedside chair)?: A Little Help needed to walk in hospital room?: A Little Help needed climbing 3-5 steps with a railing? : A Little 6 Click Score: 19    End of Session Equipment Utilized During Treatment: Gait belt Activity Tolerance: Patient tolerated treatment well Patient left: in  chair;with call bell/phone within reach;with chair alarm set Nurse Communication: Mobility status PT Visit Diagnosis: Difficulty in walking, not elsewhere classified (R26.2)     Time: 4888-9169 PT Time Calculation (min) (ACUTE ONLY): 24 min  Charges:  $Gait Training: 8-22 mins $Therapeutic Activity: 8-22 mins                     Rica Koyanagi  PTA Acute  Rehabilitation Services Pager      [phone] Office      [phone]

## 2020-01-06 NOTE — Progress Notes (Signed)
Discharge instructions reviewed with patient utilizing teach back method no question at this time. Notified son Carloyn Manner [place])of patient discharge plan. Advised to call GUI and schedule follow up appointment.

## 2020-01-08 LAB — ACID FAST SMEAR (AFB, MYCOBACTERIA): Acid Fast Smear: NEGATIVE

## 2020-01-09 ENCOUNTER — Other Ambulatory Visit: Payer: Self-pay | Admitting: *Deleted

## 2020-01-09 DIAGNOSIS — G4700 Insomnia, unspecified: Secondary | ICD-10-CM | POA: Diagnosis not present

## 2020-01-09 DIAGNOSIS — I9589 Other hypotension: Secondary | ICD-10-CM | POA: Diagnosis not present

## 2020-01-09 DIAGNOSIS — G2581 Restless legs syndrome: Secondary | ICD-10-CM | POA: Diagnosis not present

## 2020-01-09 DIAGNOSIS — N183 Chronic kidney disease, stage 3 unspecified: Secondary | ICD-10-CM | POA: Diagnosis not present

## 2020-01-09 DIAGNOSIS — K219 Gastro-esophageal reflux disease without esophagitis: Secondary | ICD-10-CM | POA: Diagnosis not present

## 2020-01-09 DIAGNOSIS — J918 Pleural effusion in other conditions classified elsewhere: Secondary | ICD-10-CM | POA: Diagnosis not present

## 2020-01-09 DIAGNOSIS — K7460 Unspecified cirrhosis of liver: Secondary | ICD-10-CM | POA: Diagnosis not present

## 2020-01-09 DIAGNOSIS — R601 Generalized edema: Secondary | ICD-10-CM | POA: Diagnosis not present

## 2020-01-09 DIAGNOSIS — J449 Chronic obstructive pulmonary disease, unspecified: Secondary | ICD-10-CM | POA: Diagnosis not present

## 2020-01-09 DIAGNOSIS — D631 Anemia in chronic kidney disease: Secondary | ICD-10-CM | POA: Diagnosis not present

## 2020-01-09 DIAGNOSIS — K5909 Other constipation: Secondary | ICD-10-CM | POA: Diagnosis not present

## 2020-01-09 DIAGNOSIS — M1990 Unspecified osteoarthritis, unspecified site: Secondary | ICD-10-CM | POA: Diagnosis not present

## 2020-01-09 DIAGNOSIS — R188 Other ascites: Secondary | ICD-10-CM | POA: Diagnosis not present

## 2020-01-09 DIAGNOSIS — J9621 Acute and chronic respiratory failure with hypoxia: Secondary | ICD-10-CM | POA: Diagnosis not present

## 2020-01-09 DIAGNOSIS — J9811 Atelectasis: Secondary | ICD-10-CM | POA: Diagnosis not present

## 2020-01-09 DIAGNOSIS — D696 Thrombocytopenia, unspecified: Secondary | ICD-10-CM | POA: Diagnosis not present

## 2020-01-09 LAB — BODY FLUID CULTURE
Culture: NO GROWTH
Gram Stain: NONE SEEN

## 2020-01-09 LAB — CYTOLOGY - NON PAP

## 2020-01-09 NOTE — Patient Outreach (Signed)
[facility]) Care Management  01/09/2020  MISHELL DONALSON 01/12/1936 [id number]   Red on EMMI Red Alert  Day#1 Date: 01/08/20  Who reached Patient  Got discharge papers? I Don't Know  Know who to call about changes in condition? No   Outreach attempt #1 Subjective : Successful outreach call to patient, explained reason for the call and [facility] care management .  Patient with some difficulty in hearing. Discussed automated calls receiving after discharge, addressed EMMI red flag, patient reports having discharge papers in home. When discussed knowing who to call about changes in her condition she states calling her son , 911 if needed like before this admission. She discussed  notifying Dr. Shelia Media office   of changes in condition sometimes, encouraged notifying MD of changes /worsening of condition sooner to address concerns to help avoid hosptial a Patient states her door bell is ringing ,she states it is the physical  therapist coming. She is agreeable to return call later today to complete assessment .   Patient is 84 year old female with admission at [facility] on 1/18-1/23/21 for Acute Respiratory failure with hypoxia, Anasarca, COPD, Right pleural effusion,non alcoholic Cirrhosis of liver with ascites.  PMHx : Non Hodgkins Lymphoma, colon cancer s/p right cololectomy, 01/2015, CKD stage 3, GERD,hypotension .   [place] [facility] Nursing facility stay in the last 3 months.   1500  Returned call to patient , HIPAA verified x 2 identifiers.  Patient discussed Home health therapist was out for an assessment on today and will return on Monday for session.  Social Patient reports living at home alone, she manages with personal care, doing sponge bath, while sitting on her rollator seat. She discussed using her walker at all times. She reports neighbors, pastor brings her food daily, she is able to warm food in microwave. She states her appetite is not  that good, she drinks one to two ensure a day between meals at times.  Patient reports her son makes her appointments and provides transportation.  She discussed recent stay at rehab and wants to be able to stay at home now .  Conditions: Patient discussed recent admission for shortness of breath, with fluid on her lung that was removed twice during her stay. She denies worsening of shortness of breath at this time, speaking in complete sentences, no cough. She report overall feeling better than she did before going to the hospital   Non alcoholic cirrhosis/Anascarca - she denies increase in size of abdominal area, she reports decrease in swelling of lower extremities . Patient reports not being able to weight unless someone was present due to being unsteady when stepping on the scales. History of Non hodgkin's Lymphoma followed by Dr.Sherrill.  Medications  She states her daughter in law has filled her pill box with medication with  list from discharge.   Appointments patient son has scheduled patient post discharge appointments with Dr. Shelia Media, PCP  on 2/3 and Dr. Benson Norway, GI,  on 2/4 he is available to provide transportation as needed.   Reviewed [facility] care management services , no cost  benefit and does not interfere with home health services arranged. She is agreeable to [facility] care management follow up after discharge for education support of chronic conditions ,care coordination and assessment of further care management needs.   Plan Will place referral for telephonic care management services  Will send patient successful outreach letter.    Joylene Draft, RN, BSN  [place] Management Coordinator  [phone]- Mobile [phone]- Toll Free Main Office

## 2020-01-10 ENCOUNTER — Other Ambulatory Visit: Payer: Self-pay | Admitting: *Deleted

## 2020-01-10 NOTE — Patient Outreach (Signed)
Outreach call to pt for initial telephone assessment, no answer to telephone and RN CM can hear loud scratching noise, unable to complete the call.  RN CM mailed unsuccessful outreach letter to pt home.  PLAN Outreach pt in 3-4 business days  Jacqlyn Larsen [facility], [place] [phone]

## 2020-01-15 ENCOUNTER — Other Ambulatory Visit: Payer: Self-pay | Admitting: *Deleted

## 2020-01-15 DIAGNOSIS — M1990 Unspecified osteoarthritis, unspecified site: Secondary | ICD-10-CM | POA: Diagnosis not present

## 2020-01-15 DIAGNOSIS — J918 Pleural effusion in other conditions classified elsewhere: Secondary | ICD-10-CM | POA: Diagnosis not present

## 2020-01-15 DIAGNOSIS — J9811 Atelectasis: Secondary | ICD-10-CM | POA: Diagnosis not present

## 2020-01-15 DIAGNOSIS — G4700 Insomnia, unspecified: Secondary | ICD-10-CM | POA: Diagnosis not present

## 2020-01-15 DIAGNOSIS — K5909 Other constipation: Secondary | ICD-10-CM | POA: Diagnosis not present

## 2020-01-15 DIAGNOSIS — R188 Other ascites: Secondary | ICD-10-CM | POA: Diagnosis not present

## 2020-01-15 DIAGNOSIS — K219 Gastro-esophageal reflux disease without esophagitis: Secondary | ICD-10-CM | POA: Diagnosis not present

## 2020-01-15 DIAGNOSIS — G2581 Restless legs syndrome: Secondary | ICD-10-CM | POA: Diagnosis not present

## 2020-01-15 DIAGNOSIS — R601 Generalized edema: Secondary | ICD-10-CM | POA: Diagnosis not present

## 2020-01-15 DIAGNOSIS — K7460 Unspecified cirrhosis of liver: Secondary | ICD-10-CM | POA: Diagnosis not present

## 2020-01-15 DIAGNOSIS — J9621 Acute and chronic respiratory failure with hypoxia: Secondary | ICD-10-CM | POA: Diagnosis not present

## 2020-01-15 DIAGNOSIS — I9589 Other hypotension: Secondary | ICD-10-CM | POA: Diagnosis not present

## 2020-01-15 DIAGNOSIS — J449 Chronic obstructive pulmonary disease, unspecified: Secondary | ICD-10-CM | POA: Diagnosis not present

## 2020-01-15 DIAGNOSIS — D631 Anemia in chronic kidney disease: Secondary | ICD-10-CM | POA: Diagnosis not present

## 2020-01-15 DIAGNOSIS — N183 Chronic kidney disease, stage 3 unspecified: Secondary | ICD-10-CM | POA: Diagnosis not present

## 2020-01-15 DIAGNOSIS — D696 Thrombocytopenia, unspecified: Secondary | ICD-10-CM | POA: Diagnosis not present

## 2020-01-15 NOTE — Patient Outreach (Signed)
Outreach call to pt/ 2nd attempt for initial telephonic assessment, spoke with pt who states " I can't really hear what you're saying"  RN CM had to talk in loud voice and pt decided it would be better for RN CM to speak with her son Alveda Reasons at [phone].  RN CM called patient's son Carloyn Manner with no answer to telephone and no option to leave voicemail.  PLAN Outreach patient's son in 3-4 business days  Jacqlyn Larsen [facility], [place] Coordinator [phone]

## 2020-01-16 ENCOUNTER — Telehealth: Payer: Self-pay | Admitting: Adult Health Nurse Practitioner

## 2020-01-16 DIAGNOSIS — D696 Thrombocytopenia, unspecified: Secondary | ICD-10-CM | POA: Diagnosis not present

## 2020-01-16 DIAGNOSIS — R42 Dizziness and giddiness: Secondary | ICD-10-CM | POA: Diagnosis not present

## 2020-01-16 DIAGNOSIS — K7460 Unspecified cirrhosis of liver: Secondary | ICD-10-CM | POA: Diagnosis not present

## 2020-01-16 DIAGNOSIS — I10 Essential (primary) hypertension: Secondary | ICD-10-CM | POA: Diagnosis not present

## 2020-01-17 ENCOUNTER — Inpatient Hospital Stay (HOSPITAL_COMMUNITY)
Admission: EM | Admit: 2020-01-17 | Discharge: 2020-01-19 | DRG: 641 | Disposition: A | Discharge status: Hospice | Payer: Medicare Other | Attending: Internal Medicine | Admitting: Internal Medicine

## 2020-01-17 ENCOUNTER — Other Ambulatory Visit: Payer: Self-pay

## 2020-01-17 ENCOUNTER — Emergency Department (HOSPITAL_COMMUNITY): Payer: Medicare Other

## 2020-01-17 ENCOUNTER — Telehealth: Payer: Self-pay | Admitting: Adult Health Nurse Practitioner

## 2020-01-17 ENCOUNTER — Encounter (HOSPITAL_COMMUNITY): Payer: Self-pay

## 2020-01-17 DIAGNOSIS — Z515 Encounter for palliative care: Secondary | ICD-10-CM

## 2020-01-17 DIAGNOSIS — Z885 Allergy status to narcotic agent status: Secondary | ICD-10-CM

## 2020-01-17 DIAGNOSIS — Z87891 Personal history of nicotine dependence: Secondary | ICD-10-CM

## 2020-01-17 DIAGNOSIS — I959 Hypotension, unspecified: Secondary | ICD-10-CM | POA: Diagnosis not present

## 2020-01-17 DIAGNOSIS — Z880 Allergy status to penicillin: Secondary | ICD-10-CM

## 2020-01-17 DIAGNOSIS — R52 Pain, unspecified: Secondary | ICD-10-CM | POA: Diagnosis not present

## 2020-01-17 DIAGNOSIS — N179 Acute kidney failure, unspecified: Secondary | ICD-10-CM

## 2020-01-17 DIAGNOSIS — Z8572 Personal history of non-Hodgkin lymphomas: Secondary | ICD-10-CM | POA: Diagnosis not present

## 2020-01-17 DIAGNOSIS — Z809 Family history of malignant neoplasm, unspecified: Secondary | ICD-10-CM | POA: Diagnosis not present

## 2020-01-17 DIAGNOSIS — K5790 Diverticulosis of intestine, part unspecified, without perforation or abscess without bleeding: Secondary | ICD-10-CM | POA: Diagnosis not present

## 2020-01-17 DIAGNOSIS — Z833 Family history of diabetes mellitus: Secondary | ICD-10-CM

## 2020-01-17 DIAGNOSIS — I129 Hypertensive chronic kidney disease with stage 1 through stage 4 chronic kidney disease, or unspecified chronic kidney disease: Secondary | ICD-10-CM | POA: Diagnosis not present

## 2020-01-17 DIAGNOSIS — H3530 Unspecified macular degeneration: Secondary | ICD-10-CM | POA: Diagnosis present

## 2020-01-17 DIAGNOSIS — J449 Chronic obstructive pulmonary disease, unspecified: Secondary | ICD-10-CM | POA: Diagnosis not present

## 2020-01-17 DIAGNOSIS — Z7189 Other specified counseling: Secondary | ICD-10-CM | POA: Diagnosis not present

## 2020-01-17 DIAGNOSIS — Z85038 Personal history of other malignant neoplasm of large intestine: Secondary | ICD-10-CM | POA: Diagnosis not present

## 2020-01-17 DIAGNOSIS — Z881 Allergy status to other antibiotic agents status: Secondary | ICD-10-CM | POA: Diagnosis not present

## 2020-01-17 DIAGNOSIS — N1832 Chronic kidney disease, stage 3b: Secondary | ICD-10-CM | POA: Diagnosis present

## 2020-01-17 DIAGNOSIS — L89151 Pressure ulcer of sacral region, stage 1: Secondary | ICD-10-CM | POA: Diagnosis present

## 2020-01-17 DIAGNOSIS — G8929 Other chronic pain: Secondary | ICD-10-CM | POA: Diagnosis present

## 2020-01-17 DIAGNOSIS — Z832 Family history of diseases of the blood and blood-forming organs and certain disorders involving the immune mechanism: Secondary | ICD-10-CM | POA: Diagnosis not present

## 2020-01-17 DIAGNOSIS — E785 Hyperlipidemia, unspecified: Secondary | ICD-10-CM | POA: Diagnosis not present

## 2020-01-17 DIAGNOSIS — R627 Adult failure to thrive: Secondary | ICD-10-CM | POA: Diagnosis present

## 2020-01-17 DIAGNOSIS — Z85828 Personal history of other malignant neoplasm of skin: Secondary | ICD-10-CM

## 2020-01-17 DIAGNOSIS — M1990 Unspecified osteoarthritis, unspecified site: Secondary | ICD-10-CM | POA: Diagnosis not present

## 2020-01-17 DIAGNOSIS — E875 Hyperkalemia: Principal | ICD-10-CM

## 2020-01-17 DIAGNOSIS — R109 Unspecified abdominal pain: Secondary | ICD-10-CM | POA: Diagnosis present

## 2020-01-17 DIAGNOSIS — R079 Chest pain, unspecified: Secondary | ICD-10-CM | POA: Diagnosis not present

## 2020-01-17 DIAGNOSIS — M5136 Other intervertebral disc degeneration, lumbar region: Secondary | ICD-10-CM | POA: Diagnosis not present

## 2020-01-17 DIAGNOSIS — R0602 Shortness of breath: Secondary | ICD-10-CM | POA: Diagnosis not present

## 2020-01-17 DIAGNOSIS — K7460 Unspecified cirrhosis of liver: Secondary | ICD-10-CM | POA: Diagnosis present

## 2020-01-17 DIAGNOSIS — H409 Unspecified glaucoma: Secondary | ICD-10-CM | POA: Diagnosis not present

## 2020-01-17 DIAGNOSIS — Z743 Need for continuous supervision: Secondary | ICD-10-CM | POA: Diagnosis not present

## 2020-01-17 DIAGNOSIS — Z66 Do not resuscitate: Secondary | ICD-10-CM | POA: Diagnosis present

## 2020-01-17 DIAGNOSIS — R531 Weakness: Secondary | ICD-10-CM | POA: Diagnosis not present

## 2020-01-17 DIAGNOSIS — H9190 Unspecified hearing loss, unspecified ear: Secondary | ICD-10-CM | POA: Diagnosis present

## 2020-01-17 DIAGNOSIS — M479 Spondylosis, unspecified: Secondary | ICD-10-CM | POA: Diagnosis present

## 2020-01-17 DIAGNOSIS — K5900 Constipation, unspecified: Secondary | ICD-10-CM | POA: Diagnosis present

## 2020-01-17 DIAGNOSIS — Z20822 Contact with and (suspected) exposure to covid-19: Secondary | ICD-10-CM | POA: Diagnosis not present

## 2020-01-17 DIAGNOSIS — Z6827 Body mass index (BMI) 27.0-27.9, adult: Secondary | ICD-10-CM

## 2020-01-17 DIAGNOSIS — R296 Repeated falls: Secondary | ICD-10-CM | POA: Diagnosis present

## 2020-01-17 DIAGNOSIS — I951 Orthostatic hypotension: Secondary | ICD-10-CM | POA: Diagnosis present

## 2020-01-17 DIAGNOSIS — K219 Gastro-esophageal reflux disease without esophagitis: Secondary | ICD-10-CM | POA: Diagnosis not present

## 2020-01-17 DIAGNOSIS — L8990 Pressure ulcer of unspecified site, unspecified stage: Secondary | ICD-10-CM | POA: Diagnosis present

## 2020-01-17 DIAGNOSIS — G2581 Restless legs syndrome: Secondary | ICD-10-CM | POA: Diagnosis present

## 2020-01-17 DIAGNOSIS — Z8249 Family history of ischemic heart disease and other diseases of the circulatory system: Secondary | ICD-10-CM

## 2020-01-17 DIAGNOSIS — D631 Anemia in chronic kidney disease: Secondary | ICD-10-CM | POA: Diagnosis present

## 2020-01-17 DIAGNOSIS — M545 Low back pain: Secondary | ICD-10-CM | POA: Diagnosis not present

## 2020-01-17 DIAGNOSIS — Z888 Allergy status to other drugs, medicaments and biological substances status: Secondary | ICD-10-CM

## 2020-01-17 LAB — COMPREHENSIVE METABOLIC PANEL
ALT: 57 U/L — ABNORMAL HIGH (ref 0–44)
AST: 59 U/L — ABNORMAL HIGH (ref 15–41)
Albumin: 4.2 g/dL (ref 3.5–5.0)
Alkaline Phosphatase: 426 U/L — ABNORMAL HIGH (ref 38–126)
Anion gap: 11 (ref 5–15)
BUN: 48 mg/dL — ABNORMAL HIGH (ref 8–23)
CO2: 26 mmol/L (ref 22–32)
Calcium: 9.7 mg/dL (ref 8.9–10.3)
Chloride: 104 mmol/L (ref 98–111)
Creatinine, Ser: 1.76 mg/dL — ABNORMAL HIGH (ref 0.44–1.00)
GFR calc Af Amer: 31 mL/min — ABNORMAL LOW (ref >60)
GFR calc non Af Amer: 27 mL/min — ABNORMAL LOW (ref >60)
Glucose, Bld: 99 mg/dL (ref 70–99)
Potassium: 5.3 mmol/L — ABNORMAL HIGH (ref 3.5–5.1)
Sodium: 141 mmol/L (ref 135–145)
Total Bilirubin: 1.7 mg/dL — ABNORMAL HIGH (ref 0.3–1.2)
Total Protein: 7.2 g/dL (ref 6.5–8.1)

## 2020-01-17 LAB — CBC
HCT: 44.5 % (ref 36.0–46.0)
Hemoglobin: 15 g/dL (ref 12.0–15.0)
MCH: 33.9 pg (ref 26.0–34.0)
MCHC: 33.7 g/dL (ref 30.0–36.0)
MCV: 100.7 fL — ABNORMAL HIGH (ref 80.0–100.0)
Platelets: 144 10*3/uL — ABNORMAL LOW (ref 150–400)
RBC: 4.42 MIL/uL (ref 3.87–5.11)
RDW: 14.6 % (ref 11.5–15.5)
WBC: 3.3 10*3/uL — ABNORMAL LOW (ref 4.0–10.5)
nRBC: 0 % (ref 0.0–0.2)

## 2020-01-17 LAB — RESPIRATORY PANEL BY RT PCR (FLU A&B, COVID)
Influenza A by PCR: NEGATIVE
Influenza B by PCR: NEGATIVE
SARS Coronavirus 2 by RT PCR: NEGATIVE

## 2020-01-17 MED ORDER — ONDANSETRON HCL 4 MG PO TABS: 4.0000 mg | ORAL_TABLET | Freq: Three times a day (TID) | ORAL | Status: DC | PRN

## 2020-01-17 MED ORDER — MIDODRINE HCL 2.5 MG PO TABS
2.5000 mg | ORAL_TABLET | Freq: Two times a day (BID) | ORAL | Status: DC
  Administered 2020-01-17 – 2020-01-19 (×5): 2.5 mg via ORAL
  Filled 2020-01-17 (×5): qty 1

## 2020-01-17 MED ORDER — ASCORBIC ACID 500 MG PO TABS
500.0000 mg | ORAL_TABLET | Freq: Every day | ORAL | Status: DC
  Administered 2020-01-17 – 2020-01-19 (×3): 500 mg via ORAL
  Filled 2020-01-17 (×3): qty 1

## 2020-01-17 MED ORDER — DOCUSATE SODIUM 100 MG PO CAPS
100.0000 mg | ORAL_CAPSULE | Freq: Every day | ORAL | Status: DC
  Administered 2020-01-17 – 2020-01-19 (×3): 100 mg via ORAL
  Filled 2020-01-17 (×3): qty 1

## 2020-01-17 MED ORDER — FLUDROCORTISONE ACETATE 0.1 MG PO TABS
200.0000 ug | ORAL_TABLET | Freq: Every day | ORAL | Status: DC
  Administered 2020-01-18 – 2020-01-19 (×2): 200 ug via ORAL
  Filled 2020-01-17 (×2): qty 2

## 2020-01-17 MED ORDER — POLYETHYLENE GLYCOL 3350 17 G PO PACK: 17.0000 g | PACK | Freq: Every day | ORAL | Status: DC | PRN

## 2020-01-17 MED ORDER — LACTULOSE 10 GM/15ML PO SOLN: 20.0000 g | Freq: Every day | ORAL | Status: DC | PRN

## 2020-01-17 MED ORDER — PANTOPRAZOLE SODIUM 40 MG PO TBEC
40.0000 mg | DELAYED_RELEASE_TABLET | Freq: Every day | ORAL | Status: DC
  Administered 2020-01-18 – 2020-01-19 (×2): 40 mg via ORAL
  Filled 2020-01-17 (×2): qty 1

## 2020-01-17 MED ORDER — CALCIUM CARBONATE-VITAMIN D 500-200 MG-UNIT PO TABS
1.0000 | ORAL_TABLET | Freq: Every day | ORAL | Status: DC
  Administered 2020-01-18 – 2020-01-19 (×2): 1 via ORAL
  Filled 2020-01-17 (×2): qty 1

## 2020-01-17 MED ORDER — FUROSEMIDE 40 MG PO TABS
40.0000 mg | ORAL_TABLET | Freq: Every day | ORAL | Status: DC
  Administered 2020-01-18 – 2020-01-19 (×2): 40 mg via ORAL
  Filled 2020-01-17 (×2): qty 1

## 2020-01-17 MED ORDER — HEPARIN SODIUM (PORCINE) 5000 UNIT/ML IJ SOLN
5000.0000 [IU] | Freq: Three times a day (TID) | INTRAMUSCULAR | Status: DC
  Administered 2020-01-17 – 2020-01-19 (×5): 5000 [IU] via SUBCUTANEOUS
  Filled 2020-01-17 (×5): qty 1

## 2020-01-17 MED ORDER — VITAMIN B-12 1000 MCG PO TABS
1000.0000 ug | ORAL_TABLET | Freq: Every day | ORAL | Status: DC
  Administered 2020-01-18 – 2020-01-19 (×2): 1000 ug via ORAL
  Filled 2020-01-17 (×2): qty 1

## 2020-01-17 NOTE — Telephone Encounter (Signed)
Left message for son Alveda Reasons that we did receive a Hospice referral for his Mom.

## 2020-01-17 NOTE — ED Provider Notes (Signed)
[place] DEPT Provider Note   CSN: [id number] Arrival date & time: 01/17/20  1119     History Chief Complaint  Patient presents with  . Abnormal Lab    Erica Escobar is a 84 y.o. female.  HPI Patient sent in reportedly for worsening kidney function hyperkalemia and for a hospice consult.  Patient states she is just feeling bad.  Reviewing records hospice has been consulted as an outpatient but is not seen patient yet.  Patient states she will do whatever the doctors tell her to do.  States she is caring less fluid than she normally does.  History of cirrhosis with renal issues.  Has had both thoracentesis and paracentesis.    Past Medical History:  Diagnosis Date  . Abnormal tympanic membrane    right ear and mastoid problems  . Arthritis    oseoarthritis  . Cancer of ascending colon ([place]) 02/15/2015  . Constipation, chronic   . COPD (chronic obstructive pulmonary disease) (HCC)    mild COPD  . Depression   . Diverticulosis   . GERD (gastroesophageal reflux disease)   . Glaucoma    bilateral-sugery with laser, using eye drops  . Hemorrhoids    Internal  . History of hiatal hernia    dx. '87 Schatzki's ring  . Hyperlipidemia    good control  . Hypertension   . Insomnia    hx. of  . Keratosis, actinic   . Leg cramps   . Macular degeneration   . Neuromuscular disorder (HCC)    neuropathy legs,   . On aspirin at home    chronic use  . PONV (postoperative nausea and vomiting)   . Restless leg syndrome   . Schatzki's ring 1987  . Skin cancer    multiple, "tx. area right anterior wrist"  . Urinary incontinence    occ, stress    Patient Active Problem List   Diagnosis Date Noted  . Anasarca 01/01/2020  . DNR (do not resuscitate) 01/01/2020  . Recurrent right pleural effusion   . Hepatic cirrhosis ([place])   . Acute respiratory failure with hypoxia ([place]) 10/12/2019  . Pleural effusion on right 10/12/2019  . Cirrhosis of liver  with ascites ([place]) 10/12/2019  . Pancytopenia ([place]) 10/12/2019  . Malnutrition of moderate degree 08/30/2018  . Weakness 08/30/2018  . Symptomatic anemia 08/29/2018  . Acute kidney injury ([place]) 08/14/2018  . Dehydration 08/14/2018  . FTT (failure to thrive) in adult 08/14/2018  . Pressure injury of skin 08/14/2018  . Acute back pain 07/23/2018  . CKD (chronic kidney disease), stage III 07/23/2018  . Anorexia 07/23/2018  . Hyperlipidemia 07/23/2018  . Large cell lymphoma ([place]) 07/20/2018  . Metastatic cancer ([place]) 07/12/2018  . Chronic back pain 07/12/2018  . Pathologic rib fracture, initial encounter 07/12/2018  . Chest wall mass 07/12/2018  . Cancer of ascending colon s/p robotic colectomy 02/15/2015 02/15/2015  . Restless leg syndrome   . Depression   . Insomnia   . COPD (chronic obstructive pulmonary disease) ([place])   . GERD (gastroesophageal reflux disease)     Past Surgical History:  Procedure Laterality Date  . ABDOMINAL HYSTERECTOMY    . BLADDER REPAIR    . CARDIAC CATHETERIZATION  2007, 2009  . CATARACT EXTRACTION Bilateral   . CHOLECYSTECTOMY    . COLECTOMY    . FINGER NAIL SURGERY    . FOOT SURGERY Right   . HEMORROIDECTOMY    . INNER EAR SURGERY Right  3-4 times  . INTRAOPERATIVE ARTERIOGRAM     right arm  . IR THORACENTESIS ASP PLEURAL SPACE W/IMG GUIDE  01/05/2020  . KNEE SURGERY Right   . LEG SURGERY Bilateral    laser ablation  . MASTOID DEBRIDEMENT     x2  . SKIN CANCER EXCISION     right/left thumb nail, left face, right ear/neck, right leg  . TOE FUSION    . TONSILLECTOMY       OB History   No obstetric history on file.     Family History  Problem Relation Age of Onset  . Diabetes Mother   . Cancer Father   . Cancer Brother   . Cancer Brother   . Clotting disorder Child   . Hypertension Other     Social History   Tobacco Use  . Smoking status: Former Research scientist (life sciences)  . Smokeless tobacco: Never Used  . Tobacco comment: short time x1 -  during pregnancy  Substance Use Topics  . Alcohol use: No    Alcohol/week: 0.0 standard drinks  . Drug use: No    Home Medications Prior to Admission medications   Medication Sig Start Date End Date Taking? Authorizing Provider  betamethasone valerate ointment (VALISONE) 0.1 % Apply 1 application topically 2 (two) times daily as needed (Heat under breast).    [provider]  calcium-vitamin D (OSCAL WITH D) 250-125 MG-UNIT tablet Take 1 tablet by mouth daily.    [provider]  ENSURE (ENSURE) Take [phone] mLs by mouth daily. VANILLA OR STRAWBERRY IF AVAILABLE PO DAILY     [provider]  fludrocortisone (FLORINEF) 0.1 MG tablet Take 200 mcg by mouth daily. 12/12/19   [provider]  furosemide (LASIX) 40 MG tablet Take 1 tablet (40 mg total) by mouth daily. 01/07/20   Alma Friendly, MD  lactulose (CHRONULAC) 10 GM/15ML solution Take 20 g by mouth daily as needed for mild constipation.  11/13/19   [provider]  loratadine (CLARITIN) 10 MG tablet Take 10 mg by mouth daily as needed for allergies.    [provider]  midodrine (PROAMATINE) 2.5 MG tablet Take 1 tablet (2.5 mg total) by mouth 2 (two) times daily with a meal. 10/19/19   Guilford Shi, MD  Multiple Vitamin (MULTIVITAMIN WITH MINERALS) TABS tablet Take 1 tablet by mouth daily.    [provider]  Omega-3 Fatty Acids (FISH OIL) 1000 MG CAPS Take 2,000 mg by mouth daily with breakfast.     [provider]  ondansetron (ZOFRAN) 4 MG tablet Take 1 tablet (4 mg total) by mouth every 8 (eight) hours as needed for nausea or vomiting. 08/24/19   Ladell Pier, MD  pantoprazole (PROTONIX) 40 MG tablet Take 40 mg by mouth daily.    [provider]  polyethylene glycol (MIRALAX / GLYCOLAX) packet Take 17 g by mouth daily as needed for moderate constipation.     [provider]  Polyvinyl Alcohol-Povidone PF (REFRESH) 1.4-0.6 % SOLN Place  1 drop into both eyes 3 (three) times daily as needed (dry eyes).     [provider]  potassium chloride SA (KLOR-CON) 20 MEQ tablet Take 2 tablets (40 mEq total) by mouth 2 (two) times daily for 14 days. 01/06/20 01/20/20  Alma Friendly, MD  spironolactone (ALDACTONE) 100 MG tablet Take 1 tablet (100 mg total) by mouth daily. 01/07/20 02/06/20  Alma Friendly, MD  vitamin B-12 (CYANOCOBALAMIN) 1000 MCG tablet Take 1,000 mcg by  mouth daily.    [provider]  vitamin C (ASCORBIC ACID) 500 MG tablet Take 500 mg by mouth daily.    [provider]    Allergies    Azithromycin, Allopurinol, Cephalexin, Codeine, Gabapentin, Niaspan [niacin], Oxybutynin, Paxil [paroxetine hcl], Penicillins, Pentazocine, Tizanidine, Tramadol, and Vicodin [hydrocodone-acetaminophen]  Review of Systems   Review of Systems  Constitutional: Positive for appetite change and fatigue.  Respiratory: Positive for shortness of breath.   Cardiovascular: Positive for leg swelling. Negative for chest pain.  Gastrointestinal: Negative for abdominal pain.  Genitourinary: Negative for flank pain.  Musculoskeletal: Negative for back pain.  Skin: Negative for wound.  Neurological: Negative for weakness.    Physical Exam Updated Vital Signs BP 106/83   Pulse 92   Temp 97.8 F (36.6 C) (Oral)   Resp 15   SpO2 100%   Physical Exam Vitals and nursing note reviewed.  HENT:     Head: Atraumatic.  Eyes:     Pupils: Pupils are equal, round, and reactive to light.  Cardiovascular:     Rate and Rhythm: Regular rhythm.  Pulmonary:     Breath sounds: No wheezing or rhonchi.  Abdominal:     Tenderness: There is no abdominal tenderness.  Musculoskeletal:     Comments: Mild edema bilateral lower extremities.  Skin:    Capillary Refill: Capillary refill takes less than 2 seconds.  Neurological:     Mental Status: She is alert.     Comments: Patient is awake and appropriate.  Answers  questions.     ED Results / Procedures / Treatments   Labs (all labs ordered are listed, but only abnormal results are displayed) Labs Reviewed  COMPREHENSIVE METABOLIC PANEL - Abnormal; Notable for the following components:      Result Value   Potassium 5.3 (*)    BUN 48 (*)    Creatinine, Ser 1.76 (*)    AST 59 (*)    ALT 57 (*)    Alkaline Phosphatase 426 (*)    Total Bilirubin 1.7 (*)    GFR calc non Af Amer 27 (*)    GFR calc Af Amer 31 (*)    All other components within normal limits  CBC - Abnormal; Notable for the following components:   WBC 3.3 (*)    MCV 100.7 (*)    Platelets 144 (*)    All other components within normal limits  RESPIRATORY PANEL BY RT PCR (FLU A&B, COVID)    EKG EKG Interpretation  Date/Time:  Wednesday January 17 2020 11:53:30 EST Ventricular Rate:  95 PR Interval:  162 QRS Duration: 74 QT Interval:  354 QTC Calculation: 444 R Axis:   35 Text Interpretation: Normal sinus rhythm Normal ECG Confirmed by Davonna Belling [id number]) on 01/17/2020 12:57:19 PM   Radiology DG Chest Portable 1 View  Result Date: 01/17/2020 CLINICAL DATA:  Right-sided abdominal and chest pain, weakness EXAM: PORTABLE CHEST 1 VIEW COMPARISON:  01/05/2020 FINDINGS: The heart size and mediastinal contours are within normal limits. Both lungs are clear. The visualized skeletal structures are unremarkable. IMPRESSION: No active disease. Electronically Signed   By: Randa Ngo M.D.   On: 01/17/2020 12:42    Procedures Procedures (including critical care time)  Medications Ordered in ED Medications - No data to display  ED Course  I have reviewed the triage vital signs and the nursing notes.  Pertinent labs & imaging results that were available during my care of the patient were reviewed by me  and considered in my medical decision making (see chart for details).    MDM Rules/Calculators/A&P                     Discussed with palliative care associate with the  hospital.  Patient cannot be consulted in the ER but may need admission for it.  They discussed with Berkley Harvey [phone] palliative care of [facility] and she can be contacted if needed  Patient's creatinine has increased from previous but not all the way up to 2.  Around 1.8.  Potassium is mildly elevated but not the 6.9 it appears to be prehospital.  However patient is generally weak and lives alone.  Worsening fatigue and plan for hospice although not arranged yet.  With worsening renal function feels the patient benefit for admission to the hospital to get more the sorted out.  Discussed with Berkley Harvey again.  She can be the liaison if needed.  Our palliative care may or may not be involved.  Will discuss with hospitalist   Final Clinical Impression(s) / ED Diagnoses Final diagnoses:  AKI (acute kidney injury) ([place])  Hyperkalemia  Encounter for palliative care    Rx / DC Orders ED Discharge Orders    None       Davonna Belling, MD 01/17/20 1257

## 2020-01-17 NOTE — Progress Notes (Signed)
ED TO INPATIENT HANDOFF REPORT  Name/Age/Gender Erica Escobar 84 y.o. female  Code Status Code Status History    Date Active Date Inactive Code Status Order ID Comments User Context   01/01/2020 1306 01/06/2020 2141 DNR [id number]  Norval Morton, MD ED   10/12/2019 2237 10/19/2019 2234 DNR [id number]  Lenore Cordia, MD ED   08/29/2018 2323 09/02/2018 2036 DNR [id number]  Toy Baker, MD Inpatient   08/14/2018 1643 08/17/2018 2009 Full Code [id number]  Mendel Corning, MD Inpatient   07/12/2018 2005 07/16/2018 1546 Full Code [id number]  Tomma Rakers, MD ED   02/15/2015 1239 02/19/2015 1334 Full Code [id number]  Michael Boston, MD Inpatient   Advance Care Planning Activity    Questions for Most Recent Historical Code Status (Order [id number])    Question Answer Comment   In the event of cardiac or respiratory ARREST Do not call a "code blue"    In the event of cardiac or respiratory ARREST Do not perform Intubation, CPR, defibrillation or ACLS    In the event of cardiac or respiratory ARREST Use medication by any route, position, wound care, and other measures to relive pain and suffering. May use oxygen, suction and manual treatment of airway obstruction as needed for comfort.       Home/SNF/Other Home  Chief Complaint Hyperkalemia [E87.5]  Level of Care/Admitting Diagnosis ED Disposition    ED Disposition Condition Comment   Admit  Hospital Area: [facility] [[id number]]  Level of Care: Telemetry [5]  Admit to tele based on following criteria: Monitor QTC interval  Covid Evaluation: Asymptomatic Screening Protocol (No Symptoms)  Diagnosis: Hyperkalemia [637858]  Admitting Physician: Harold Hedge [[id number]]  Attending Physician: Harold Hedge [[id number]]       Medical History Past Medical History:  Diagnosis Date  . Abnormal tympanic membrane    right ear and mastoid problems  . Arthritis    oseoarthritis  . Cancer of ascending colon ([place])  02/15/2015  . Constipation, chronic   . COPD (chronic obstructive pulmonary disease) (HCC)    mild COPD  . Depression   . Diverticulosis   . GERD (gastroesophageal reflux disease)   . Glaucoma    bilateral-sugery with laser, using eye drops  . Hemorrhoids    Internal  . History of hiatal hernia    dx. '87 Schatzki's ring  . Hyperlipidemia    good control  . Hypertension   . Insomnia    hx. of  . Keratosis, actinic   . Leg cramps   . Macular degeneration   . Neuromuscular disorder (HCC)    neuropathy legs,   . On aspirin at home    chronic use  . PONV (postoperative nausea and vomiting)   . Restless leg syndrome   . Schatzki's ring 1987  . Skin cancer    multiple, "tx. area right anterior wrist"  . Urinary incontinence    occ, stress    Allergies Allergies  Allergen Reactions  . Azithromycin Itching  . Allopurinol Other (See Comments)    Unknown  . Cephalexin Other (See Comments)    Unknown  . Codeine Itching  . Gabapentin Swelling and Other (See Comments)    Throat and leg swelling  . Niaspan [Niacin] Other (See Comments)    Unknown  . Oxybutynin Other (See Comments)    "made me go crazy"-- had crazy dreams.   . Paxil [Paroxetine Hcl] Other (See Comments)    Unknown reaction  . Penicillins  Itching and Other (See Comments)    Has patient had a PCN reaction causing immediate rash, facial/tongue/throat swelling, SOB or lightheadedness with hypotension: No Has patient had a PCN reaction causing severe rash involving mucus membranes or skin necrosis: Yes Has patient had a PCN reaction that required hospitalization: No Has patient had a PCN reaction occurring within the last 10 years: No If all of the above answers are "NO", then may proceed with Cephalosporin use.   Marland Kitchen Pentazocine Itching, Nausea Only and Other (See Comments)    Headache.   . Tizanidine Other (See Comments)    "made me crazy"  . Tramadol Other (See Comments)    Head feels "swimmy" and weakness   . Vicodin [Hydrocodone-Acetaminophen] Other (See Comments)    "made me go crazy"    IV Location/Drains/Wounds Patient Lines/Drains/Airways Status   Active Line/Drains/Airways    Name:   Placement date:   Placement time:   Site:   Days:   Peripheral IV 01/17/20 Left Antecubital   01/17/20    1158    Antecubital   less than 1   External Urinary Catheter   10/13/19    2233    --   96          Labs/Imaging Results for orders placed or performed during the hospital encounter of 01/17/20 (from the past 48 hour(s))  Comprehensive metabolic panel     Status: Abnormal   Collection Time: 01/17/20 11:59 AM  Result Value Ref Range   Sodium 141 135 - 145 mmol/L   Potassium 5.3 (H) 3.5 - 5.1 mmol/L   Chloride 104 98 - 111 mmol/L   CO2 26 22 - 32 mmol/L   Glucose, Bld 99 70 - 99 mg/dL   BUN 48 (H) 8 - 23 mg/dL   Creatinine, Ser 1.76 (H) 0.44 - 1.00 mg/dL   Calcium 9.7 8.9 - 10.3 mg/dL   Total Protein 7.2 6.5 - 8.1 g/dL   Albumin 4.2 3.5 - 5.0 g/dL   AST 59 (H) 15 - 41 U/L   ALT 57 (H) 0 - 44 U/L   Alkaline Phosphatase 426 (H) 38 - 126 U/L   Total Bilirubin 1.7 (H) 0.3 - 1.2 mg/dL   GFR calc non Af Amer 27 (L) >60 mL/min   GFR calc Af Amer 31 (L) >60 mL/min   Anion gap 11 5 - 15    Comment: Performed at [facility], [place] [address], [place], [place] [address]  CBC     Status: Abnormal   Collection Time: 01/17/20 11:59 AM  Result Value Ref Range   WBC 3.3 (L) 4.0 - 10.5 K/uL   RBC 4.42 3.87 - 5.11 MIL/uL   Hemoglobin 15.0 12.0 - 15.0 g/dL   HCT 44.5 36.0 - 46.0 %   MCV 100.7 (H) 80.0 - 100.0 fL   MCH 33.9 26.0 - 34.0 pg   MCHC 33.7 30.0 - 36.0 g/dL   RDW 14.6 11.5 - 15.5 %   Platelets 144 (L) 150 - 400 K/uL   nRBC 0.0 0.0 - 0.2 %    Comment: Performed at [facility], [place] [address], [place], [place] [address]  Respiratory Panel by RT PCR (Flu A&B, Covid) - Nasopharyngeal Swab     Status: None   Collection Time: 01/17/20 12:16 PM    Specimen: Nasopharyngeal Swab  Result Value Ref Range   SARS Coronavirus 2 by RT PCR NEGATIVE NEGATIVE    Comment: (NOTE) SARS-CoV-2 target nucleic  acids are NOT DETECTED. The SARS-CoV-2 RNA is generally detectable in upper respiratoy specimens during the acute phase of infection. The lowest concentration of SARS-CoV-2 viral copies this assay can detect is 131 copies/mL. A negative result does not preclude SARS-Cov-2 infection and should not be used as the sole basis for treatment or other patient management decisions. A negative result may occur with  improper specimen collection/handling, submission of specimen other than nasopharyngeal swab, presence of viral mutation(s) within the areas targeted by this assay, and inadequate number of viral copies (<131 copies/mL). A negative result must be combined with clinical observations, patient history, and epidemiological information. The expected result is Negative. Fact Sheet for Patients:  PinkCheek.be Fact Sheet for Healthcare Providers:  GravelBags.it This test is not yet ap proved or cleared by the [place] FDA and  has been authorized for detection and/or diagnosis of SARS-CoV-2 by FDA under an Emergency Use Authorization (EUA). This EUA will remain  in effect (meaning this test can be used) for the duration of the COVID-19 declaration under Section 564(b)(1) of the Act, 21 U.S.C. section 360bbb-3(b)(1), unless the authorization is terminated or revoked sooner.    Influenza A by PCR NEGATIVE NEGATIVE   Influenza B by PCR NEGATIVE NEGATIVE    Comment: (NOTE) The Xpert Xpress SARS-CoV-2/FLU/RSV assay is intended as an aid in  the diagnosis of influenza from Nasopharyngeal swab specimens and  should not be used as a sole basis for treatment. Nasal washings and  aspirates are unacceptable for Xpert Xpress SARS-CoV-2/FLU/RSV  testing. Fact Sheet for  Patients: PinkCheek.be Fact Sheet for Healthcare Providers: GravelBags.it This test is not yet approved or cleared by the [place] FDA and  has been authorized for detection and/or diagnosis of SARS-CoV-2 by  FDA under an Emergency Use Authorization (EUA). This EUA will remain  in effect (meaning this test can be used) for the duration of the  Covid-19 declaration under Section 564(b)(1) of the Act, 21  U.S.C. section 360bbb-3(b)(1), unless the authorization is  terminated or revoked. Performed at [facility], [place] [address], [place], [place] [address]    DG Chest Portable 1 View  Result Date: 01/17/2020 CLINICAL DATA:  Right-sided abdominal and chest pain, weakness EXAM: PORTABLE CHEST 1 VIEW COMPARISON:  01/05/2020 FINDINGS: The heart size and mediastinal contours are within normal limits. Both lungs are clear. The visualized skeletal structures are unremarkable. IMPRESSION: No active disease. Electronically Signed   By: Randa Ngo M.D.   On: 01/17/2020 12:42    Pending Labs [employer] (From admission, onward)    Start     Ordered   Signed and Held  CBC  (heparin)  Once,   R    Comments: Baseline for heparin therapy IF NOT ALREADY DRAWN.  Notify MD if PLT < 100 K.    Signed and Held   Signed and Held  Creatinine, serum  (heparin)  Once,   R    Comments: Baseline for heparin therapy IF NOT ALREADY DRAWN.    Signed and Held   Signed and Held  Comprehensive metabolic panel  Tomorrow morning,   R     Signed and Held   Signed and Held  Magnesium  Tomorrow morning,   R     Signed and Held   Signed and Held  Phosphorus  Tomorrow morning,   R     Signed and Held   Signed and Held  CBC  Tomorrow morning,   R  Signed and Held   Signed and Held  Na and K (sodium & potassium), rand urine  ONCE - STAT,   STAT     Signed and Held          Vitals/Pain Today's Vitals   01/17/20 1430 01/17/20  1500 01/17/20 1515 01/17/20 1531  BP: 115/78 119/80  118/74  Pulse: 93  100 93  Resp: 16 16 (!) 22 16  Temp:      TempSrc:      SpO2: 100%  100% 98%    Isolation Precautions No active isolations  Medications Medications - No data to display  Mobility walks with device

## 2020-01-17 NOTE — Telephone Encounter (Signed)
Rec'd notification that Dr. Pennie Banter office had called and wanted to know if we had seen the patient yet.  Patient was scheduled for Palliative Consult on 01/05/20 and just found out that patient was in the hospital at the time of the appointment.  Called son Carloyn Manner to see if he wanted to reschedule the Palliative Consult and he stated that he just got off the phone with Dr. Shelia Media and he thought that he was ordering Hospice services for her now.  I went and and rescheduled the Palliative Consult for 01/25/20 @ 10:30 AM just in case the patient wasn't admitted to Hospice services and son was in agreement with this.

## 2020-01-17 NOTE — ED Notes (Signed)
Attempted to call report. Will try again later.  

## 2020-01-17 NOTE — H&P (Signed)
History and Physical        Hospital Admission Note Date: 01/17/2020  Patient name: Erica Escobar Medical record number: [id number] Date of birth: 26-Jul-1936 Age: 84 y.o. Gender: female  PCP: Deland Pretty, MD  Patient coming from: Home Lives with: Alone At baseline, ambulates: Rolling walker  Chief Complaint    Abnormal labs   HPI:   This is an 84 year old female with history of chronic orthostatic hypotension, hyperlipidemia, COPD, nonalcoholic liver cirrhosis, non-Hodgkin's lymphoma, colon cancer status post right colectomy (2016), CKD 3, anemia, GERD, recent admission for acute on chronic respiratory failure secondary to anasarca with recurrent right sided pleural effusions/hepatic hydrothorax status post thoracentesis 1/18 and 01/05/20 and started on Lasix, spironolactone and recommended outpatient follow-up with PCP at that time who was supposed to have initial visit with palliative care outpatient which did not occur yet and had follow-up with PCP with recent lab work showing K6.9 and BUN/creatinine 42/2.0.  Labs not in chart, obtained from daughter-in-law who is an NP who obtained labs over the phone from PCP.  Patient states that she has had multiple falls over the past weekend including fall on Friday where she hit her head and slid down the wall as well as a fall hurting her low back on the sink this past Saturday.  States that she was getting up and had blackout.  Has history of orthostasis.  She recently had multiple medication changes at her recent discharge most notably spironolactone being added, potassium added and Lasix increased.  Otherwise, patient complaining of chronic abdominal pain.  Denies any acute complaints at this time  In ED: ED physician discussed with palliative care associate at the hospital who is to follow-up with patient in a.m.  ED labs: K5.3,  BUN/creatinine 48/1.76, alkaline phosphatase 426, AST/ALT 59/57, total bilirubin 1.7, WBC 3.3, negative COVID-19  Review of Systems:  Review of Systems  Constitutional: Negative for chills and fever.  HENT: Negative.   Eyes: Negative for double vision.  Respiratory: Negative for cough and shortness of breath.   Cardiovascular: Negative for chest pain and palpitations.  Gastrointestinal: Positive for abdominal pain and constipation. Negative for nausea and vomiting.  Genitourinary: Negative for dysuria and urgency.  Musculoskeletal: Positive for falls. Negative for myalgias and neck pain.  Skin: Negative.   Neurological: Negative for dizziness and headaches.  Endo/Heme/Allergies: Negative.   Psychiatric/Behavioral: Negative.   All other systems reviewed and are negative.   Medical/Social/Family History   Past Medical History: Past Medical History:  Diagnosis Date  . Abnormal tympanic membrane    right ear and mastoid problems  . Arthritis    oseoarthritis  . Cancer of ascending colon ([place]) 02/15/2015  . Constipation, chronic   . COPD (chronic obstructive pulmonary disease) (HCC)    mild COPD  . Depression   . Diverticulosis   . GERD (gastroesophageal reflux disease)   . Glaucoma    bilateral-sugery with laser, using eye drops  . Hemorrhoids    Internal  . History of hiatal hernia    dx. '87 Schatzki's ring  . Hyperlipidemia    good control  . Hypertension   . Insomnia    hx. of  . Keratosis, actinic   .  Leg cramps   . Macular degeneration   . Neuromuscular disorder (HCC)    neuropathy legs,   . On aspirin at home    chronic use  . PONV (postoperative nausea and vomiting)   . Restless leg syndrome   . Schatzki's ring 1987  . Skin cancer    multiple, "tx. area right anterior wrist"  . Urinary incontinence    occ, stress    Past Surgical History:  Procedure Laterality Date  . ABDOMINAL HYSTERECTOMY    . BLADDER REPAIR    . CARDIAC CATHETERIZATION  2007,  2009  . CATARACT EXTRACTION Bilateral   . CHOLECYSTECTOMY    . COLECTOMY    . FINGER NAIL SURGERY    . FOOT SURGERY Right   . HEMORROIDECTOMY    . INNER EAR SURGERY Right    3-4 times  . INTRAOPERATIVE ARTERIOGRAM     right arm  . IR THORACENTESIS ASP PLEURAL SPACE W/IMG GUIDE  01/05/2020  . KNEE SURGERY Right   . LEG SURGERY Bilateral    laser ablation  . MASTOID DEBRIDEMENT     x2  . SKIN CANCER EXCISION     right/left thumb nail, left face, right ear/neck, right leg  . TOE FUSION    . TONSILLECTOMY      Medications: Prior to Admission medications   Medication Sig Start Date End Date Taking? Authorizing Provider  betamethasone valerate ointment (VALISONE) 0.1 % Apply 1 application topically 2 (two) times daily as needed (Heat under breast).    [provider]  calcium-vitamin D (OSCAL WITH D) 250-125 MG-UNIT tablet Take 1 tablet by mouth daily.    [provider]  ENSURE (ENSURE) Take [phone] mLs by mouth daily. VANILLA OR STRAWBERRY IF AVAILABLE PO DAILY     [provider]  fludrocortisone (FLORINEF) 0.1 MG tablet Take 200 mcg by mouth daily. 12/12/19   [provider]  furosemide (LASIX) 40 MG tablet Take 1 tablet (40 mg total) by mouth daily. 01/07/20   Alma Friendly, MD  lactulose (CHRONULAC) 10 GM/15ML solution Take 20 g by mouth daily as needed for mild constipation.  11/13/19   [provider]  loratadine (CLARITIN) 10 MG tablet Take 10 mg by mouth daily as needed for allergies.    [provider]  midodrine (PROAMATINE) 2.5 MG tablet Take 1 tablet (2.5 mg total) by mouth 2 (two) times daily with a meal. 10/19/19   Guilford Shi, MD  Multiple Vitamin (MULTIVITAMIN WITH MINERALS) TABS tablet Take 1 tablet by mouth daily.    [provider]  Omega-3 Fatty Acids (FISH OIL) 1000 MG CAPS Take 2,000 mg by mouth daily with breakfast.     [provider]  ondansetron (ZOFRAN) 4 MG tablet Take 1  tablet (4 mg total) by mouth every 8 (eight) hours as needed for nausea or vomiting. 08/24/19   Ladell Pier, MD  pantoprazole (PROTONIX) 40 MG tablet Take 40 mg by mouth daily.    [provider]  polyethylene glycol (MIRALAX / GLYCOLAX) packet Take 17 g by mouth daily as needed for moderate constipation.     [provider]  Polyvinyl Alcohol-Povidone PF (REFRESH) 1.4-0.6 % SOLN Place 1 drop into both eyes 3 (three) times daily as needed (dry eyes).     [provider]  potassium chloride SA (KLOR-CON) 20 MEQ tablet Take 2 tablets (40 mEq total) by mouth 2 (two) times daily for 14 days. 01/06/20 01/20/20  Alma Friendly,  MD  spironolactone (ALDACTONE) 100 MG tablet Take 1 tablet (100 mg total) by mouth daily. 01/07/20 02/06/20  Alma Friendly, MD  vitamin B-12 (CYANOCOBALAMIN) 1000 MCG tablet Take 1,000 mcg by mouth daily.    [provider]  vitamin C (ASCORBIC ACID) 500 MG tablet Take 500 mg by mouth daily.    [provider]    Allergies:   Allergies  Allergen Reactions  . Azithromycin Itching  . Allopurinol Other (See Comments)    Unknown  . Cephalexin Other (See Comments)    Unknown  . Codeine Itching  . Gabapentin Swelling and Other (See Comments)    Throat and leg swelling  . Niaspan [Niacin] Other (See Comments)    Unknown  . Oxybutynin Other (See Comments)    "made me go crazy"-- had crazy dreams.   . Paxil [Paroxetine Hcl] Other (See Comments)    Unknown reaction  . Penicillins Itching and Other (See Comments)    Has patient had a PCN reaction causing immediate rash, facial/tongue/throat swelling, SOB or lightheadedness with hypotension: No Has patient had a PCN reaction causing severe rash involving mucus membranes or skin necrosis: Yes Has patient had a PCN reaction that required hospitalization: No Has patient had a PCN reaction occurring within the last 10 years: No If all of the above answers are "NO", then may  proceed with Cephalosporin use.   Marland Kitchen Pentazocine Itching, Nausea Only and Other (See Comments)    Headache.   . Tizanidine Other (See Comments)    "made me crazy"  . Tramadol Other (See Comments)    Head feels "swimmy" and weakness  . Vicodin [Hydrocodone-Acetaminophen] Other (See Comments)    "made me go crazy"    Social History:  reports that she has quit smoking. She has never used smokeless tobacco. She reports that she does not drink alcohol or use drugs.  Family History: Family History  Problem Relation Age of Onset  . Diabetes Mother   . Cancer Father   . Cancer Brother   . Cancer Brother   . Clotting disorder Child   . Hypertension Other      Objective   Physical Exam: Blood pressure 115/78, pulse 93, temperature 97.8 F (36.6 C), temperature source Oral, resp. rate 16, SpO2 100 %.  Physical Exam Vitals and nursing note reviewed.  Constitutional:      Comments: Frail elderly female  HENT:     Head: Normocephalic.     Mouth/Throat:     Mouth: Mucous membranes are moist.  Eyes:     Extraocular Movements: Extraocular movements intact.     Conjunctiva/sclera: Conjunctivae normal.  Cardiovascular:     Rate and Rhythm: Normal rate and regular rhythm.  Pulmonary:     Effort: Pulmonary effort is normal.     Breath sounds: Normal breath sounds.  Abdominal:     General: Abdomen is flat.     Comments: Generalized tenderness to palpation  Musculoskeletal:        General: No swelling or tenderness.     Cervical back: Normal range of motion. No rigidity.     Right lower leg: No edema.     Left lower leg: No edema.  Skin:    General: Skin is warm.     Coloration: Skin is not jaundiced.  Neurological:     General: No focal deficit present.     Mental Status: She is alert. Mental status is at baseline.  Psychiatric:  Mood and Affect: Mood normal.        Behavior: Behavior normal.     LABS on Admission: I have personally reviewed all the labs and  imagings below    Basic Metabolic Panel: Recent Labs  Lab 01/17/20 1159  NA 141  K 5.3*  CL 104  CO2 26  GLUCOSE 99  BUN 48*  CREATININE 1.76*  CALCIUM 9.7   Liver Function Tests: Recent Labs  Lab 01/17/20 1159  AST 59*  ALT 57*  ALKPHOS 426*  BILITOT 1.7*  PROT 7.2  ALBUMIN 4.2   No results for input(s): LIPASE, AMYLASE in the last 168 hours. No results for input(s): AMMONIA in the last 168 hours. CBC: Recent Labs  Lab 01/17/20 1159  WBC 3.3*  HGB 15.0  HCT 44.5  MCV 100.7*  PLT 144*   Cardiac Enzymes: No results for input(s): CKTOTAL, CKMB, CKMBINDEX, TROPONINI in the last 168 hours. BNP: Invalid input(s): POCBNP CBG: No results for input(s): GLUCAP in the last 168 hours.  Radiological Exams on Admission:  DG Chest Portable 1 View  Result Date: 01/17/2020 CLINICAL DATA:  Right-sided abdominal and chest pain, weakness EXAM: PORTABLE CHEST 1 VIEW COMPARISON:  01/05/2020 FINDINGS: The heart size and mediastinal contours are within normal limits. Both lungs are clear. The visualized skeletal structures are unremarkable. IMPRESSION: No active disease. Electronically Signed   By: Randa Ngo M.D.   On: 01/17/2020 12:42      EKG: Independently reviewed.  Regular rate and rhythm, normal axis   A & P   Active Problems:   * No active hospital problems. *   1. Hyperkalemia a. Reportedly outpatient lab K6.9, possibly lab error as K5.3 today b. Hyperkalemia in setting of potassium supplementation and spironolactone c. Hold spironolactone d. hold potassium supplement e. Continue Lasix f. Follow-up BMP in a.m. g. Continue telemetry 2. Multiple falls, likely orthostatic, in setting of history of chronic orthostasis  a. Continue home midodrine and Florinef b. PT/OT eval 3. Nonalcoholic cirrhosis a. Hold spironolactone b. Continue Lasix c. Continue lactulose d. Palliative care consulted 4. Constipation a. Continue bowel regimen 5. History of colon  cancer and non-Hodgkin's lymphoma    DVT prophylaxis: Heparin   Code Status: Prior  Diet: Low-salt Family Communication: Admission, patients condition and plan of care including tests being ordered have been discussed with the patient who indicates understanding and agrees with the plan and Code Status. Patient's daughter-in-law was updated by phone Disposition Plan: The appropriate patient status for this patient is OBSERVATION. Observation status is judged to be reasonable and necessary in order to provide the required intensity of service to ensure the patient's safety. The patient's presenting symptoms, physical exam findings, and initial radiographic and laboratory data in the context of their medical condition is felt to place them at decreased risk for further clinical deterioration. Furthermore, it is anticipated that the patient will be medically stable for discharge from the hospital within 2 midnights of admission. The following factors support the patient status of observation.   " The patient's presenting symptoms include falls. " The physical exam findings include abdominal pain. " The initial radiographic and laboratory data are hyperkalemia.   Consultants  . Palliative  Procedures  . None  Time Spent on Admission: 50 minutes     Harold Hedge, DO [facility] 01/17/2020, 2:45 PM

## 2020-01-17 NOTE — Progress Notes (Signed)
Hydrologist [facility])  Hospital Liaison: RN note    This patient is a new referral to [facility] home hospice.  [facility] hospital liaisons will follow to coordinate initial hospice visit after discharge and any DME needs prior to discharge.    Please call with any hospice related questions.   Farrel Gordon, RN, CCM  [facility] Hospital Liaison (listed on AMION under Hospice and [facility] [place])  [phone]

## 2020-01-17 NOTE — ED Notes (Signed)
Pure wick has been placed. Suction set to 45mmHg.  

## 2020-01-17 NOTE — ED Triage Notes (Signed)
Patient was discharged from the hospital 01/06/20. She was discharged with medication change. Yesterday she had a follow up appointment with her MD. Lab results were as follows: potassium 6.9, Creatinine 2.0, BUN 42. Her Md would like her admitted. She also complains of chronic right side pain.   EMS vitals: 108/70 BP 88 HR 99% O2 sat on room air

## 2020-01-18 ENCOUNTER — Other Ambulatory Visit: Payer: Self-pay | Admitting: *Deleted

## 2020-01-18 ENCOUNTER — Observation Stay (HOSPITAL_COMMUNITY): Payer: Medicare Other

## 2020-01-18 DIAGNOSIS — H409 Unspecified glaucoma: Secondary | ICD-10-CM | POA: Diagnosis present

## 2020-01-18 DIAGNOSIS — G2581 Restless legs syndrome: Secondary | ICD-10-CM | POA: Diagnosis present

## 2020-01-18 DIAGNOSIS — Z832 Family history of diseases of the blood and blood-forming organs and certain disorders involving the immune mechanism: Secondary | ICD-10-CM | POA: Diagnosis not present

## 2020-01-18 DIAGNOSIS — M545 Low back pain: Secondary | ICD-10-CM | POA: Diagnosis not present

## 2020-01-18 DIAGNOSIS — Z515 Encounter for palliative care: Secondary | ICD-10-CM

## 2020-01-18 DIAGNOSIS — E785 Hyperlipidemia, unspecified: Secondary | ICD-10-CM | POA: Diagnosis present

## 2020-01-18 DIAGNOSIS — R531 Weakness: Secondary | ICD-10-CM | POA: Diagnosis not present

## 2020-01-18 DIAGNOSIS — K219 Gastro-esophageal reflux disease without esophagitis: Secondary | ICD-10-CM | POA: Diagnosis present

## 2020-01-18 DIAGNOSIS — Z66 Do not resuscitate: Secondary | ICD-10-CM | POA: Diagnosis present

## 2020-01-18 DIAGNOSIS — Z8249 Family history of ischemic heart disease and other diseases of the circulatory system: Secondary | ICD-10-CM | POA: Diagnosis not present

## 2020-01-18 DIAGNOSIS — Z20822 Contact with and (suspected) exposure to covid-19: Secondary | ICD-10-CM | POA: Diagnosis present

## 2020-01-18 DIAGNOSIS — M5136 Other intervertebral disc degeneration, lumbar region: Secondary | ICD-10-CM | POA: Diagnosis not present

## 2020-01-18 DIAGNOSIS — Z7189 Other specified counseling: Secondary | ICD-10-CM | POA: Diagnosis not present

## 2020-01-18 DIAGNOSIS — Z87891 Personal history of nicotine dependence: Secondary | ICD-10-CM | POA: Diagnosis not present

## 2020-01-18 DIAGNOSIS — Z85038 Personal history of other malignant neoplasm of large intestine: Secondary | ICD-10-CM | POA: Diagnosis not present

## 2020-01-18 DIAGNOSIS — N1832 Chronic kidney disease, stage 3b: Secondary | ICD-10-CM | POA: Diagnosis present

## 2020-01-18 DIAGNOSIS — Z8572 Personal history of non-Hodgkin lymphomas: Secondary | ICD-10-CM | POA: Diagnosis not present

## 2020-01-18 DIAGNOSIS — H3530 Unspecified macular degeneration: Secondary | ICD-10-CM | POA: Diagnosis present

## 2020-01-18 DIAGNOSIS — Z833 Family history of diabetes mellitus: Secondary | ICD-10-CM | POA: Diagnosis not present

## 2020-01-18 DIAGNOSIS — Z809 Family history of malignant neoplasm, unspecified: Secondary | ICD-10-CM | POA: Diagnosis not present

## 2020-01-18 DIAGNOSIS — G8929 Other chronic pain: Secondary | ICD-10-CM

## 2020-01-18 DIAGNOSIS — Z881 Allergy status to other antibiotic agents status: Secondary | ICD-10-CM | POA: Diagnosis not present

## 2020-01-18 DIAGNOSIS — E875 Hyperkalemia: Secondary | ICD-10-CM | POA: Diagnosis present

## 2020-01-18 DIAGNOSIS — N179 Acute kidney failure, unspecified: Secondary | ICD-10-CM | POA: Diagnosis present

## 2020-01-18 DIAGNOSIS — Z85828 Personal history of other malignant neoplasm of skin: Secondary | ICD-10-CM | POA: Diagnosis not present

## 2020-01-18 DIAGNOSIS — J449 Chronic obstructive pulmonary disease, unspecified: Secondary | ICD-10-CM | POA: Diagnosis present

## 2020-01-18 DIAGNOSIS — I129 Hypertensive chronic kidney disease with stage 1 through stage 4 chronic kidney disease, or unspecified chronic kidney disease: Secondary | ICD-10-CM | POA: Diagnosis present

## 2020-01-18 DIAGNOSIS — K5790 Diverticulosis of intestine, part unspecified, without perforation or abscess without bleeding: Secondary | ICD-10-CM | POA: Diagnosis present

## 2020-01-18 DIAGNOSIS — M1990 Unspecified osteoarthritis, unspecified site: Secondary | ICD-10-CM | POA: Diagnosis present

## 2020-01-18 DIAGNOSIS — K7460 Unspecified cirrhosis of liver: Secondary | ICD-10-CM | POA: Diagnosis present

## 2020-01-18 LAB — COMPREHENSIVE METABOLIC PANEL
ALT: 47 U/L — ABNORMAL HIGH (ref 0–44)
AST: 43 U/L — ABNORMAL HIGH (ref 15–41)
Albumin: 3.3 g/dL — ABNORMAL LOW (ref 3.5–5.0)
Alkaline Phosphatase: 320 U/L — ABNORMAL HIGH (ref 38–126)
Anion gap: 9 (ref 5–15)
BUN: 54 mg/dL — ABNORMAL HIGH (ref 8–23)
CO2: 25 mmol/L (ref 22–32)
Calcium: 8.9 mg/dL (ref 8.9–10.3)
Chloride: 104 mmol/L (ref 98–111)
Creatinine, Ser: 2.18 mg/dL — ABNORMAL HIGH (ref 0.44–1.00)
GFR calc Af Amer: 24 mL/min — ABNORMAL LOW (ref >60)
GFR calc non Af Amer: 21 mL/min — ABNORMAL LOW (ref >60)
Glucose, Bld: 103 mg/dL — ABNORMAL HIGH (ref 70–99)
Potassium: 5.1 mmol/L (ref 3.5–5.1)
Sodium: 138 mmol/L (ref 135–145)
Total Bilirubin: 2 mg/dL — ABNORMAL HIGH (ref 0.3–1.2)
Total Protein: 5.8 g/dL — ABNORMAL LOW (ref 6.5–8.1)

## 2020-01-18 LAB — MAGNESIUM: Magnesium: 2 mg/dL (ref 1.7–2.4)

## 2020-01-18 LAB — PHOSPHORUS: Phosphorus: 3.6 mg/dL (ref 2.5–4.6)

## 2020-01-18 LAB — CBC
HCT: 36.1 % (ref 36.0–46.0)
Hemoglobin: 12.5 g/dL (ref 12.0–15.0)
MCH: 34.7 pg — ABNORMAL HIGH (ref 26.0–34.0)
MCHC: 34.6 g/dL (ref 30.0–36.0)
MCV: 100.3 fL — ABNORMAL HIGH (ref 80.0–100.0)
Platelets: 127 10*3/uL — ABNORMAL LOW (ref 150–400)
RBC: 3.6 MIL/uL — ABNORMAL LOW (ref 3.87–5.11)
RDW: 14.5 % (ref 11.5–15.5)
WBC: 3.3 10*3/uL — ABNORMAL LOW (ref 4.0–10.5)
nRBC: 0 % (ref 0.0–0.2)

## 2020-01-18 MED ORDER — FENTANYL CITRATE (PF) 100 MCG/2ML IJ SOLN
12.5000 ug | INTRAMUSCULAR | Status: DC | PRN
  Administered 2020-01-18 – 2020-01-19 (×2): 12.5 ug via INTRAVENOUS
  Filled 2020-01-18 (×3): qty 2

## 2020-01-18 MED ORDER — SODIUM CHLORIDE 0.9 % IV SOLN: INTRAVENOUS | Status: AC

## 2020-01-18 NOTE — Progress Notes (Signed)
Patient suffers from orthostatic hypotension, lymphoma, h/o multiple falls which impairs their ability to perform daily activities like walking in the home.  A walker alone will not resolve the issues with performing activities of daily living. A lightweight wheelchair will allow patient to safely perform daily activities.  The patient can self propel in the home or has a caregiver who can provide assistance.      Blondell Reveal Kistler PT 01/18/2020  Acute Rehabilitation Services Pager [phone] Office [phone]

## 2020-01-18 NOTE — Patient Outreach (Signed)
Outreach call to patient's son Carloyn Manner [place]/ 3rd attempt for initial telephonic assessment, Mr. Susann Givens reports pt has been admitted to [facility] for elevated potassium, he reports pt has multiple chronic health conditions, states palliative care ([facility]) did reschedule home visit for 01/25/20 and pt is also to be assessed by hospice when she returns home.  Mr. Susann Givens is okay with RN CM contacting him when pt is discharged from hospital.  RN CM sent in basket to hospital liason informing of inpatient admit.  PLAN Continue to follow   Jacqlyn Larsen [facility], BSN [place] Coordinator [phone]

## 2020-01-18 NOTE — Consult Note (Signed)
Consultation Note Date: 01/18/2020   Patient Name: Erica Escobar  DOB: May 10, 1936  MRN: [id number]  Age / Sex: 84 y.o., female  PCP: Deland Pretty, MD Referring Physician: Harold Hedge, MD  Reason for Consultation: Establishing goals of care  HPI/Patient Profile: 84 y.o. female  with past medical history of   admitted on 01/17/2020   84 year old female with history of chronic orthostatic hypotension, hyperlipidemia, COPD, nonalcoholic liver cirrhosis, non-Hodgkin's lymphoma, colon cancer status post right colectomy (2016), CKD 3, anemia, GERD, recent admission for acute on chronic respiratory failure secondary to anasarca with recurrent right sided pleural effusions/hepatic hydrothorax status post thoracentesis 1/18 and 01/05/20 and started on Lasix, spironolactone and recommended outpatient follow-up with PCP at that time who was supposed to have initial visit with palliative care outpatient which did not occur yet and had follow-up with PCP with recent lab work showing K6.9 and BUN/creatinine 42/2.0.  Labs not in chart, obtained from daughter-in-law who is an NP who obtained labs over the phone from PCP.  Patient states that she has had multiple falls over the past weekend including fall on Friday where she hit her head and slid down the wall as well as a fall hurting her low back on the sink this past Saturday.  States that she was getting up and had blackout.  Has history of orthostasis.  She recently had multiple medication changes at her recent discharge most notably spironolactone being added, potassium added and Lasix increased.  Clinical Assessment and Goals of Care:  Patient admitted with hyperkalemia, recent multiple falls, orthostasis, non alcoholic cirrhosis and constipation. She also has history of colon cancer and non hodgkin's lymphoma. Patient was recently at [facility] rehab.   Palliative consult  for goals of care and disposition discussions.   Patient is hard of hearing, she is able to feed herself breakfast. She wants to be home and not in a SNF, she is afraid of standing up/walking due to recent falls, she has some pain in her back and abdomen which is chronic in nature.  Call placed and discussed with daughter in law Vaughan Basta at [phone].   Palliative medicine is specialized medical care for people living with serious illness. It focuses on providing relief from the symptoms and stress of a serious illness. The goal is to improve quality of life for both the patient and the family.  Goals of care: Broad aims of medical therapy in relation to the patient's values and preferences. Our aim is to provide medical care aimed at enabling patients to achieve the goals that matter most to them, given the circumstances of their particular medical situation and their constraints.   Vaughan Basta has experience working for a hospice agency, and she is also an NP. We talked about the patient's current condition, her underlying chronic irreversible illness of CKD, worsening renal indices.   We talked about patient's frailty and deconditioning, and possible limited prognosis. Hospice eligibility was discussed and agreed upon.   See below.   NEXT OF  KIN  son and daughter in law.   SUMMARY OF RECOMMENDATIONS    DNR DNI Continue current mode of care, agree with adjusting her diuretic regimen and monitoring her chemistry profile.  Home with hospice on discharge, discussed with daughter in law Vaughan Basta who is an NP. Patient recently completed rehab attempt at [place], doesn't want SNF. She has worsening kidney function, ongoing pain and non pain symptom burden, frailty deconditioning and failure to thrive.  Plain film X rays of her back, as per daughter in [place] requests, patient has been complaining of a lot of back pain.  Add low dose Fentanyl IV PRN for pain while she is in the hospital. Patient with  worsening serum creatinine, would not add NSAIDs, she has allergy to opioids with tylenol. Continue to monitor.  Thank you for the consult.   Code Status/Advance Care Planning:  DNR    Symptom Management:    as above   Palliative Prophylaxis:   Delirium Protocol  Additional Recommendations (Limitations, Scope, Preferences):  Home with hospice  Psycho-social/Spiritual:   Desire for further Chaplaincy support:yes  Additional Recommendations: Education on Hospice  Prognosis:   Few weeks to some very limited number of months is possible. Could be much shorter if patient's kidney function continues to worsen. We talked about the transition from home with hospice to residential hospice becoming appropriate in the near future.   Discharge Planning: Home with Hospice      Primary Diagnoses: Present on Admission: . Hyperkalemia   I have reviewed the medical record, interviewed the patient and family, and examined the patient. The following aspects are pertinent.  Past Medical History:  Diagnosis Date  . Abnormal tympanic membrane    right ear and mastoid problems  . Arthritis    oseoarthritis  . Cancer of ascending colon ([place]) 02/15/2015  . Constipation, chronic   . COPD (chronic obstructive pulmonary disease) (HCC)    mild COPD  . Depression   . Diverticulosis   . GERD (gastroesophageal reflux disease)   . Glaucoma    bilateral-sugery with laser, using eye drops  . Hemorrhoids    Internal  . History of hiatal hernia    dx. '87 Schatzki's ring  . Hyperlipidemia    good control  . Hypertension   . Insomnia    hx. of  . Keratosis, actinic   . Leg cramps   . Macular degeneration   . Neuromuscular disorder (HCC)    neuropathy legs,   . On aspirin at home    chronic use  . PONV (postoperative nausea and vomiting)   . Restless leg syndrome   . Schatzki's ring 1987  . Skin cancer    multiple, "tx. area right anterior wrist"  . Urinary incontinence    occ,  stress   Social History   Socioeconomic History  . Marital status: Widowed    Spouse name: Not on file  . Number of children: Not on file  . Years of education: Not on file  . Highest education level: Not on file  Occupational History  . Not on file  Tobacco Use  . Smoking status: Former Research scientist (life sciences)  . Smokeless tobacco: Never Used  . Tobacco comment: short time x1 - during pregnancy  Substance and Sexual Activity  . Alcohol use: No    Alcohol/week: 0.0 standard drinks  . Drug use: No  . Sexual activity: Not Currently  Other Topics Concern  . Not on file  Social History Narrative  . Not on  file   Social Determinants of Health   Financial Resource Strain:   . Difficulty of Paying Living Expenses: Not on file  Food Insecurity:   . Worried About Charity fundraiser in the Last Year: Not on file  . Ran Out of Food in the Last Year: Not on file  Transportation Needs:   . Lack of Transportation (Medical): Not on file  . Lack of Transportation (Non-Medical): Not on file  Physical Activity:   . Days of Exercise per Week: Not on file  . Minutes of Exercise per Session: Not on file  Stress:   . Feeling of Stress : Not on file  Social Connections:   . Frequency of Communication with Friends and Family: Not on file  . Frequency of Social Gatherings with Friends and Family: Not on file  . Attends Religious Services: Not on file  . Active Member of Clubs or Organizations: Not on file  . Attends Archivist Meetings: Not on file  . Marital Status: Not on file   Family History  Problem Relation Age of Onset  . Diabetes Mother   . Cancer Father   . Cancer Brother   . Cancer Brother   . Clotting disorder Child   . Hypertension Other    Scheduled Meds: . vitamin C  500 mg Oral Daily  . calcium-vitamin D  1 tablet Oral Daily  . docusate sodium  100 mg Oral Daily  . fludrocortisone  200 mcg Oral Daily  . furosemide  40 mg Oral Daily  . heparin  5,000 Units Subcutaneous  Q8H  . midodrine  2.5 mg Oral BID WC  . pantoprazole  40 mg Oral Daily  . vitamin B-12  1,000 mcg Oral Daily   Continuous Infusions: . sodium chloride 75 mL/hr at 01/18/20 0841   PRN Meds:.lactulose, ondansetron, polyethylene glycol Medications Prior to Admission:  Prior to Admission medications   Medication Sig Start Date End Date Taking? Authorizing Provider  betamethasone valerate ointment (VALISONE) 0.1 % Apply 1 application topically 2 (two) times daily as needed (Heat under breast).   Yes [provider]  calcium-vitamin D (OSCAL WITH D) 250-125 MG-UNIT tablet Take 1 tablet by mouth daily.   Yes [provider]  ENSURE (ENSURE) Take [phone] mLs by mouth daily. VANILLA OR STRAWBERRY IF AVAILABLE PO DAILY    Yes [provider]  fludrocortisone (FLORINEF) 0.1 MG tablet Take 200 mcg by mouth daily. 12/12/19  Yes [provider]  furosemide (LASIX) 40 MG tablet Take 1 tablet (40 mg total) by mouth daily. 01/07/20  Yes Alma Friendly, MD  lactulose (CHRONULAC) 10 GM/15ML solution Take 20 g by mouth daily as needed for mild constipation.  11/13/19  Yes [provider]  loratadine (CLARITIN) 10 MG tablet Take 10 mg by mouth daily as needed for allergies.   Yes [provider]  midodrine (PROAMATINE) 2.5 MG tablet Take 1 tablet (2.5 mg total) by mouth 2 (two) times daily with a meal. 10/19/19  Yes Guilford Shi, MD  Multiple Vitamin (MULTIVITAMIN WITH MINERALS) TABS tablet Take 1 tablet by mouth daily.   Yes [provider]  Omega-3 Fatty Acids (FISH OIL) 1000 MG CAPS Take 2,000 mg by mouth daily with breakfast.    Yes [provider]  ondansetron (ZOFRAN) 4 MG tablet Take 1 tablet (4 mg total) by mouth every 8 (eight) hours as needed for nausea or vomiting. 08/24/19  Yes Ladell Pier, MD  pantoprazole ([place])  40 MG tablet Take 40 mg by mouth daily.   Yes [provider]  polyethylene glycol (MIRALAX  / GLYCOLAX) packet Take 17 g by mouth daily as needed for moderate constipation.    Yes [provider]  Polyvinyl Alcohol-Povidone PF (REFRESH) 1.4-0.6 % SOLN Place 1 drop into both eyes 3 (three) times daily as needed (dry eyes).    Yes [provider]  potassium chloride SA (KLOR-CON) 20 MEQ tablet Take 2 tablets (40 mEq total) by mouth 2 (two) times daily for 14 days. 01/06/20 01/20/20 Yes Alma Friendly, MD  spironolactone (ALDACTONE) 100 MG tablet Take 1 tablet (100 mg total) by mouth daily. 01/07/20 02/06/20 Yes Alma Friendly, MD  vitamin B-12 (CYANOCOBALAMIN) 1000 MCG tablet Take 1,000 mcg by mouth daily.   Yes [provider]  vitamin C (ASCORBIC ACID) 500 MG tablet Take 500 mg by mouth daily.   Yes [provider]   Allergies  Allergen Reactions  . Azithromycin Itching  . Allopurinol Other (See Comments)    Unknown  . Cephalexin Other (See Comments)    Unknown  . Codeine Itching  . Gabapentin Swelling and Other (See Comments)    Throat and leg swelling  . Niaspan [Niacin] Other (See Comments)    Unknown  . Oxybutynin Other (See Comments)    "made me go crazy"-- had crazy dreams.   . Paxil [Paroxetine Hcl] Other (See Comments)    Unknown reaction  . Penicillins Itching and Other (See Comments)    Has patient had a PCN reaction causing immediate rash, facial/tongue/throat swelling, SOB or lightheadedness with hypotension: No Has patient had a PCN reaction causing severe rash involving mucus membranes or skin necrosis: Yes Has patient had a PCN reaction that required hospitalization: No Has patient had a PCN reaction occurring within the last 10 years: No If all of the above answers are "NO", then may proceed with Cephalosporin use.   Marland Kitchen Pentazocine Itching, Nausea Only and Other (See Comments)    Headache.   . Tizanidine Other (See Comments)    "made me crazy"  . Tramadol Other (See Comments)    Head feels "swimmy" and weakness  .  Vicodin [Hydrocodone-Acetaminophen] Other (See Comments)    "made me go crazy"   Review of Systems har dof hearing +feeling dizzy at times  Physical Exam Frail elderly lady Sitting up in bed She is able to feed herself breakfast Complains of back pain Complains of generalized abdominal pain No edema Awake alert S1 S2 Regular work of breathing  Vital Signs: BP 111/70 (BP Location: Left Arm)   Pulse 73   Temp 98.2 F (36.8 C) (Oral)   Resp 17   Wt 66.5 kg   SpO2 100%   BMI 27.70 kg/m  Pain Scale: 0-10   Pain Score: Asleep   SpO2: SpO2: 100 % O2 Device:SpO2: 100 % O2 Flow Rate: .   IO: Intake/output summary:   Intake/Output Summary (Last 24 hours) at 01/18/2020 1147 Last data filed at 01/17/2020 1830 Gross per 24 hour  Intake 120 ml  Output --  Net 120 ml    LBM:   Baseline Weight: Weight: 66.5 kg Most recent weight: Weight: 66.5 kg     Palliative Assessment/Data:   PPS 30%  Time In:  10 Time Out:  11 Time Total:  60  Greater than 50%  of this time was spent counseling and coordinating care related to the above assessment and plan.  Signed by: Smitty Pluck  Rowe Pavy, MD   Please contact Palliative Medicine Team phone at [phone] for questions and concerns.  For individual provider: See Shea Evans

## 2020-01-18 NOTE — Progress Notes (Addendum)
Manufacturing engineer [facility]) Hospital Liaison: RN note   Notified by [place] of patient/family request for [employer] services at home after discharge. Chart and patient information under review by [facility] physician. Hospice eligibility pending at this time. UPDATE: Patient is Hospice eligible per [facility] MD at 1700.   Writer spoke with son Carloyn Manner via telephone to initiate education related to hospice philosophy, services,  team approach to care and COVID 19 pandemic restrictions regarding limited voluntee/nursing services. Son verbalized understanding of information given. Per discussion, plan is for discharge to home by private car possibly in next 2 days.   Please send signed and completed DNR form home with patient/family. Patient will need prescriptions for discharge comfort medications.   DME needs have been discussed, patient currently has the following equipment in the home: walker, bsc, and wheelchair. Family has no further DME needs at this time. Home address has been verified and is correct in the chart.  Alveda Reasons is the family member to contact.   [facility] Referral Center aware of the above. Please notify [facility] when patient is ready to leave the unit at discharge. Call [phone] .  [facility] information and contact numbers given to patient son at time of call.    Please call with any hospice related questions.   Thank you for this referral,   Gar Ponto, RN  RN PRN/Hospital Liaison  [phone], [phone]

## 2020-01-18 NOTE — TOC Initial Note (Addendum)
Transition of Care [facility]) - Initial/Assessment Note    Patient Details  Name: Erica Escobar MRN: [id number] Date of Birth: 1936-06-02  Transition [facility]) CM/SW Contact:    Trish Mage, LCSW Phone Number: 01/18/2020, 11:32 AM  Clinical Narrative:   This patient was just discharged 2 weeks ago; at that time she returned home by herself and was set up with [facility] services through [facility].  She is readmitted with hyperkalemia and falls.  Notes in chart indicate she was just opened for services by [facility] for hospice services.  Have been unable to reach son so far this AM.   Left message for [facility] with [facility]. TOC will continue to follow during the course of hospitalization.  Addendum:  Reached out to Lawerance Bach, [phone], who confirmed wish for [facility] to follow for hospice services. She states patient will be returning home at d/c, that they do have a wheelchair in the home, and that, although there is not 24 hour care in the home, she and her husband live 3 minutes away and go check on Erica Escobar multiple times during the day. Called Sherry with [facility] to confirm the plan.             Expected Discharge Plan: Home w Hospice Care Barriers to Discharge: Continued Medical Work up(Unable to reach son so far)   Patient Goals and CMS Choice        Expected Discharge Plan and Services Expected Discharge Plan: Home w Hospice Care   Discharge Planning Services: CM Consult     Expected Discharge Date: (unknown)                                    Prior Living Arrangements/Services     Patient language and need for interpreter reviewed:: Yes        Need for Family Participation in Patient Care: Yes (Comment) Care giver support system in place?: Yes (comment) Current home services: Hospice Criminal Activity/Legal Involvement Pertinent to Current Situation/Hospitalization: No - Comment as needed  Activities of Daily Living Home Assistive  Devices/Equipment: Other (Comment), Walker (specify type), Wheelchair, Bedside commode/3-in-1, Shower chair without back, Eyeglasses(ramp entrance, 4 wheeled walker, tub/shower unit, handicap toilet (patient lives in a mobile home)) ADL Screening (condition at time of admission) Patient's cognitive ability adequate to safely complete daily activities?: Yes Is the patient deaf or have difficulty hearing?: Yes(patient hoh) Does the patient have difficulty seeing, even when wearing glasses/contacts?: No Does the patient have difficulty concentrating, remembering, or making decisions?: No Patient able to express need for assistance with ADLs?: Yes Does the patient have difficulty dressing or bathing?: No Independently performs ADLs?: Yes (appropriate for developmental age) Does the patient have difficulty walking or climbing stairs?: No Weakness of Legs: Both Weakness of Arms/Hands: None  Permission Sought/Granted Permission sought to share information with : Family Supports Permission granted to share information with : Yes, Verbal Permission Granted  Share Information with NAME: Alveda Reasons     Permission granted to share info w Relationship: son  Permission granted to share info w Contact Information: [phone]  Emotional Assessment Appearance:: Appears stated age Attitude/Demeanor/Rapport: Engaged Affect (typically observed): Appropriate Orientation: : Oriented to Self, Oriented to Place Alcohol / Substance Use: Not Applicable Psych Involvement: No (comment)  Admission diagnosis:  Encounter for palliative care [Z51.5] Hyperkalemia [E87.5] AKI (acute kidney injury) ([place]) [N17.9] Patient Active Problem  List   Diagnosis Date Noted  . Hyperkalemia 01/17/2020  . Anasarca 01/01/2020  . DNR (do not resuscitate) 01/01/2020  . Recurrent right pleural effusion   . Hepatic cirrhosis ([place])   . Acute respiratory failure with hypoxia ([place]) 10/12/2019  . Pleural effusion on right  10/12/2019  . Cirrhosis of liver with ascites ([place]) 10/12/2019  . Pancytopenia ([place]) 10/12/2019  . Malnutrition of moderate degree 08/30/2018  . Weakness 08/30/2018  . Symptomatic anemia 08/29/2018  . Acute kidney injury ([place]) 08/14/2018  . Dehydration 08/14/2018  . FTT (failure to thrive) in adult 08/14/2018  . Pressure injury of skin 08/14/2018  . Acute back pain 07/23/2018  . CKD (chronic kidney disease), stage III 07/23/2018  . Anorexia 07/23/2018  . Hyperlipidemia 07/23/2018  . Large cell lymphoma ([place]) 07/20/2018  . Metastatic cancer ([place]) 07/12/2018  . Chronic back pain 07/12/2018  . Pathologic rib fracture, initial encounter 07/12/2018  . Chest wall mass 07/12/2018  . Cancer of ascending colon s/p robotic colectomy 02/15/2015 02/15/2015  . Restless leg syndrome   . Depression   . Insomnia   . COPD (chronic obstructive pulmonary disease) ([place])   . GERD (gastroesophageal reflux disease)    PCP:  Deland Pretty, MD Pharmacy:   [facility] [address], [place] [address] Phone: [phone] Fax: [place], [place] - [place] [address] Phone: [phone] Fax: [phone]  [id number] [id number]: [phone]: [phone]    Social Determinants of Health (SDOH) Interventions    Readmission Risk Interventions Readmission Risk Prevention Plan 01/06/2020  Transportation Screening Complete  PCP or Specialist Appt within 3-5 Days Not Complete  Not Complete comments Offices not open day of d/c  HRI or H[place]view[place]  Social Work Consult for RFarwellPlanning/Counseling CArmstrongNot Applicable  Medication Review (Press photographer Complete  Some recent data might be hidden

## 2020-01-18 NOTE — Progress Notes (Signed)
PROGRESS NOTE    Erica Escobar    Code Status: DNR  DJM:[id number] DOB: 04/15/1936 DOA: 01/17/2020  PCP: Deland Pretty, MD    Hospital Summary  This is an 84 year old female who lives at home alone with history of chronic orthostatic hypotension, hyperlipidemia, COPD, nonalcoholic liver cirrhosis, non-Hodgkin's lymphoma, colon cancer status post right colectomy (2016), CKD 3, anemia, GERD, recent admission for acute on chronic respiratory failure secondary to anasarca with recurrent right sided pleural effusions/hepatic hydrothorax status post thoracentesis 1/18 and 01/05/20 and started on Lasix, spironolactone with recent outpatient lab work showing K6.9 and BUN/creatinine 42/2.0.  K 5.3 on presentation to [facility] ED. Also reported multiple falls at home Given Lasix and held potassium and sprionolactone.   Palliative care consulted and recommending Home with Hospice and given Fentanyl PRN for right hip pain  A & P   Active Problems:   Hyperkalemia    1. Hyperkalemia likely from potassium supplementation and spironolactone, improved a. Reportedly outpatient lab K6.9, possibly lab error as K5.3 on admission->5.1 today with lasix b. Continue to hold spironolactone and potassium supplement c. Continue Lasix 2. Multiple falls, likely orthostatic, in setting of history of chronic orthostasis  a. Continue home midodrine and Florinef b. PT recommending SNF c. Palliative evaluated patient today and recommending Home with Hospice at discharge. SW consulted and working on dispo. Not safe for discharge as of yet. 3. Back pain, probably from degenerative disc disease and recent fall a. No fracture on Xray b. Fentanyl Prn added by palliative c. Continue inpatient management to optimize pain control prior to discharge 4. AKI 1. Given IV fluids this AM, but also received Lasix 2. Continue IV fluids for now 5. Nonalcoholic cirrhosis a. Hold spironolactone b. Continue Lasix c. Continue lactulose d.  Palliative care consulted 6. Constipation a. Continue bowel regimen 7. History of colon cancer and non-Hodgkin's lymphoma  DVT prophylaxis: heparin Diet: low sodium diet Family Communication: Patient's daughter in law has been updated by palliative and SW Disposition Plan: not safe for discharge home due to ongoing pain management. Likely discharge home tomorrow to home with hospice.  Consultants  palliative  Procedures  none  Antibiotics   Anti-infectives (From admission, onward)   None           Subjective   Seen and examined at bedside. Continues to have low back/right hip pain, xray ordered by palliative. Admits to BM. Recently worked with PT prior to my arrival. Denies any acute complaints at this time.  Objective   Vitals:   01/17/20 2052 01/18/20 0500 01/18/20 0507 01/18/20 1445  BP: 98/66  111/70 106/70  Pulse: 90  73 83  Resp: 17  17 18   Temp: 98.1 F (36.7 C)  98.2 F (36.8 C) 97.9 F (36.6 C)  TempSrc: Oral  Oral Oral  SpO2: 99%  100% 99%  Weight:  66.5 kg      Intake/Output Summary (Last 24 hours) at 01/18/2020 1527 Last data filed at 01/17/2020 1830 Gross per 24 hour  Intake 120 ml  Output -  Net 120 ml   Filed Weights   01/18/20 0500  Weight: 66.5 kg    Examination:  Physical Exam Vitals and nursing note reviewed.  Constitutional:      Comments: Frail elderly female Hard of hearing  HENT:     Head: Normocephalic.  Eyes:     Conjunctiva/sclera: Conjunctivae normal.  Cardiovascular:     Rate and Rhythm: Normal rate and regular rhythm.  Pulmonary:  Effort: Pulmonary effort is normal.     Breath sounds: Normal breath sounds.  Abdominal:     General: Abdomen is flat.     Comments: Abdominal pain with palpation  Musculoskeletal:        General: No swelling or tenderness.  Neurological:     Mental Status: She is alert. Mental status is at baseline.  Psychiatric:        Mood and Affect: Mood normal.        Behavior: Behavior  normal.     Data Reviewed: I have personally reviewed following labs and imaging studies  CBC: Recent Labs  Lab 01/17/20 1159 01/18/20 0546  WBC 3.3* 3.3*  HGB 15.0 12.5  HCT 44.5 36.1  MCV 100.7* 100.3*  PLT 144* 342*   Basic Metabolic Panel: Recent Labs  Lab 01/17/20 1159 01/18/20 0546  NA 141 138  K 5.3* 5.1  CL 104 104  CO2 26 25  GLUCOSE 99 103*  BUN 48* 54*  CREATININE 1.76* 2.18*  CALCIUM 9.7 8.9  MG  --  2.0  PHOS  --  3.6   GFR: Estimated Creatinine Clearance: 17.7 mL/min (A) (by C-G formula based on SCr of 2.18 mg/dL (H)). Liver Function Tests: Recent Labs  Lab 01/17/20 1159 01/18/20 0546  AST 59* 43*  ALT 57* 47*  ALKPHOS 426* 320*  BILITOT 1.7* 2.0*  PROT 7.2 5.8*  ALBUMIN 4.2 3.3*   No results for input(s): LIPASE, AMYLASE in the last 168 hours. No results for input(s): AMMONIA in the last 168 hours. Coagulation Profile: No results for input(s): INR, PROTIME in the last 168 hours. Cardiac Enzymes: No results for input(s): CKTOTAL, CKMB, CKMBINDEX, TROPONINI in the last 168 hours. BNP (last 3 results) No results for input(s): PROBNP in the last 8760 hours. HbA1C: No results for input(s): HGBA1C in the last 72 hours. CBG: No results for input(s): GLUCAP in the last 168 hours. Lipid Profile: No results for input(s): CHOL, HDL, LDLCALC, TRIG, CHOLHDL, LDLDIRECT in the last 72 hours. Thyroid Function Tests: No results for input(s): TSH, T4TOTAL, FREET4, T3FREE, THYROIDAB in the last 72 hours. Anemia Panel: No results for input(s): VITAMINB12, FOLATE, FERRITIN, TIBC, IRON, RETICCTPCT in the last 72 hours. Sepsis Labs: No results for input(s): PROCALCITON, LATICACIDVEN in the last 168 hours.  Recent Results (from the past 240 hour(s))  Respiratory Panel by RT PCR (Flu A&B, Covid) - Nasopharyngeal Swab     Status: None   Collection Time: 01/17/20 12:16 PM   Specimen: Nasopharyngeal Swab  Result Value Ref Range Status   SARS Coronavirus 2  by RT PCR NEGATIVE NEGATIVE Final    Comment: (NOTE) SARS-CoV-2 target nucleic acids are NOT DETECTED. The SARS-CoV-2 RNA is generally detectable in upper respiratoy specimens during the acute phase of infection. The lowest concentration of SARS-CoV-2 viral copies this assay can detect is 131 copies/mL. A negative result does not preclude SARS-Cov-2 infection and should not be used as the sole basis for treatment or other patient management decisions. A negative result may occur with  improper specimen collection/handling, submission of specimen other than nasopharyngeal swab, presence of viral mutation(s) within the areas targeted by this assay, and inadequate number of viral copies (<131 copies/mL). A negative result must be combined with clinical observations, patient history, and epidemiological information. The expected result is Negative. Fact Sheet for Patients:  PinkCheek.be Fact Sheet for Healthcare Providers:  GravelBags.it This test is not yet ap proved or cleared by the [place] and  has been authorized for detection and/or diagnosis of SARS-CoV-2 by FDA under an Emergency Use Authorization (EUA). This EUA will remain  in effect (meaning this test can be used) for the duration of the COVID-19 declaration under Section 564(b)(1) of the Act, 21 U.S.C. section 360bbb-3(b)(1), unless the authorization is terminated or revoked sooner.    Influenza A by PCR NEGATIVE NEGATIVE Final   Influenza B by PCR NEGATIVE NEGATIVE Final    Comment: (NOTE) The Xpert Xpress SARS-CoV-2/FLU/RSV assay is intended as an aid in  the diagnosis of influenza from Nasopharyngeal swab specimens and  should not be used as a sole basis for treatment. Nasal washings and  aspirates are unacceptable for Xpert Xpress SARS-CoV-2/FLU/RSV  testing. Fact Sheet for Patients: PinkCheek.be Fact Sheet for Healthcare  Providers: GravelBags.it This test is not yet approved or cleared by the [place] FDA and  has been authorized for detection and/or diagnosis of SARS-CoV-2 by  FDA under an Emergency Use Authorization (EUA). This EUA will remain  in effect (meaning this test can be used) for the duration of the  Covid-19 declaration under Section 564(b)(1) of the Act, 21  U.S.C. section 360bbb-3(b)(1), unless the authorization is  terminated or revoked. Performed at [facility], [place] [address], [place], [place] [address]          Radiology Studies: DG Lumbar Spine 1 View  Result Date: 01/18/2020 CLINICAL DATA:  Right-sided low back pain EXAM: LUMBAR SPINE - 1 VIEW COMPARISON:  08/14/2018 FINDINGS: Single view of the lumbar spine demonstrates lumbar dextrocurvature with advanced degenerative disc disease throughout the lumbar spine, not significantly changed when compared to the prior study. No fractures appreciated on frontal view. Bilateral SI joints appear intact. IMPRESSION: Stable lumbar dextrocurvature with advanced degenerative disc disease throughout the lumbar spine. Electronically Signed   By: Davina Poke D.O.   On: 01/18/2020 12:55   DG Chest Portable 1 View  Result Date: 01/17/2020 CLINICAL DATA:  Right-sided abdominal and chest pain, weakness EXAM: PORTABLE CHEST 1 VIEW COMPARISON:  01/05/2020 FINDINGS: The heart size and mediastinal contours are within normal limits. Both lungs are clear. The visualized skeletal structures are unremarkable. IMPRESSION: No active disease. Electronically Signed   By: Randa Ngo M.D.   On: 01/17/2020 12:42        Scheduled Meds: . vitamin C  500 mg Oral Daily  . calcium-vitamin D  1 tablet Oral Daily  . docusate sodium  100 mg Oral Daily  . fludrocortisone  200 mcg Oral Daily  . furosemide  40 mg Oral Daily  . heparin  5,000 Units Subcutaneous Q8H  . midodrine  2.5 mg Oral BID WC  .  pantoprazole  40 mg Oral Daily  . vitamin B-12  1,000 mcg Oral Daily   Continuous Infusions: . sodium chloride 75 mL/hr at 01/18/20 0841     LOS: 0 days    Time spent: 20 minutes with over 50% of the time coordinating the patient's care    Harold Hedge, DO [facility] Pager [phone]  If 7PM-7AM, please contact night-coverage [email or web address] Password [facility] 01/18/2020, 3:27 PM

## 2020-01-18 NOTE — Evaluation (Signed)
Physical Therapy Evaluation Patient Details Name: Erica Escobar MRN: [id number] DOB: 1936/06/12 Today's Date: 01/18/2020   History of Present Illness  84 year old female with history of chronic orthostatic hypotension, hyperlipidemia, COPD, nonalcoholic liver cirrhosis, non-Hodgkin's lymphoma, colon cancer status post right colectomy (2016), CKD 3, anemia, GERD, recent admission for acute on chronic respiratory failure secondary to anasarca with recurrent right sided pleural effusions/hepatic hydrothorax status post thoracentesis 1/18 and 01/05/20. Pt readmitted with hyperkalemia and falls.  Clinical Impression  Pt admitted with above diagnosis. Min assist for bed mobility and stand pivot transfer x2 with RW. Pt unable to tolerate standing more than ~90 seconds 2* symptomatic orthostatic hypotension (standing BP 55/43, see flowsheets for complete set of orthostatics). WC and 24* supervision recommended due to high fall risk.  Pt currently with functional limitations due to the deficits listed below (see PT Problem List). Pt will benefit from skilled PT to increase their independence and safety with mobility to allow discharge to the venue listed below.       Follow Up Recommendations Supervision/Assistance - 24 hour;Supervision for mobility/OOB;Home health PT;SNF(HHPT vs SNF depending on progress and support available upon DC; 24* assist recommended 2* h/o falls)    Equipment Recommendations  Wheelchair cushion (measurements PT);Wheelchair (measurements PT)    Recommendations for Other Services       Precautions / Restrictions Precautions Precautions: Fall Precaution Comments: multiple falls PTA 2* orthostatic hypotension Restrictions Weight Bearing Restrictions: No      Mobility  Bed Mobility Overal bed mobility: Needs Assistance Bed Mobility: Supine to Sit     Supine to sit: Min assist     General bed mobility comments: assist to raise trunk and pivot hips to  EOB  Transfers Overall transfer level: Needs assistance Equipment used: Rolling walker (2 wheeled) Transfers: Sit to/from [employer] Transfers Sit to Stand: +2 safety/equipment;Min assist Stand pivot transfers: +2 safety/equipment;Min assist       General transfer comment: min A to rise, VCs hand placement, standing tolerance limited by symptomatic orthostatic hypotension (BP 55/43 standing, see flowsheets), stand pivot transfer bed to 3 in 1 then to recliner  Ambulation/Gait                Stairs            Wheelchair Mobility    Modified Rankin (Stroke Patients Only)       Balance Overall balance assessment: Needs assistance;History of Falls Sitting-balance support: Feet supported;Single extremity supported Sitting balance-Leahy Scale: Good     Standing balance support: Single extremity supported Standing balance-Leahy Scale: Fair Standing balance comment: relies on at least single UE support                             Pertinent Vitals/Pain Faces Pain Scale: Hurts even more Pain Location: lower abdomen extending to  R lower back Pain Descriptors / Indicators: Aching Pain Intervention(s): Limited activity within patient's tolerance;Monitored during session;Patient requesting pain meds-RN notified    Home Living Family/patient expects to be discharged to:: Private residence Living Arrangements: Alone Available Help at Discharge: Family;Neighbor;Available PRN/intermittently Type of Home: Mobile home Home Access: Ramped entrance     Home Layout: One level Home Equipment: [place] - 4 wheels;Bedside commode;Shower seat      Prior Function Level of Independence: Independent with assistive device(s)         Comments: son drives her to get groceries, bathes and dresses independently, walks with rollator  Hand Dominance        Extremity/Trunk Assessment   Upper Extremity Assessment Upper Extremity Assessment: Generalized  weakness    Lower Extremity Assessment Lower Extremity Assessment: Generalized weakness(B knee ext +4/5)    Cervical / Trunk Assessment Cervical / Trunk Assessment: Normal  Communication   Communication: HOH  Cognition Arousal/Alertness: Awake/alert Behavior During Therapy: WFL for tasks assessed/performed Overall Cognitive Status: Within Functional Limits for tasks assessed                                 General Comments: so sweet, very HOH (doesn't have hearing aides)      General Comments      Exercises     Assessment/Plan    PT Assessment Patient needs continued PT services  PT Problem List Decreased activity tolerance;Decreased balance;Decreased mobility;Pain       PT Treatment Interventions      PT Goals (Current goals can be found in the Care Plan section)  Acute Rehab PT Goals Patient Stated Goal: return home PT Goal Formulation: With patient Time For Goal Achievement: 02/01/20 Potential to Achieve Goals: Fair    Frequency Min 3X/week   Barriers to discharge Decreased caregiver support pt lives alone, son not available 24* due to work    Co-evaluation               AM-PAC PT "6 Clicks" Mobility  Outcome Measure Help needed turning from your back to your side while in a flat bed without using bedrails?: A Little Help needed moving from lying on your back to sitting on the side of a flat bed without using bedrails?: A Little Help needed moving to and from a bed to a chair (including a wheelchair)?: A Little Help needed standing up from a chair using your arms (e.g., wheelchair or bedside chair)?: A Little Help needed to walk in hospital room?: Total Help needed climbing 3-5 steps with a railing? : Total 6 Click Score: 14    End of Session Equipment Utilized During Treatment: Gait belt Activity Tolerance: Treatment limited secondary to medical complications (Comment)(dizziness in standing 2* hypotension) Patient left: in  chair;with call bell/phone within reach;with chair alarm set Nurse Communication: Mobility status PT Visit Diagnosis: Difficulty in walking, not elsewhere classified (R26.2)    Time: 2683-4196 PT Time Calculation (min) (ACUTE ONLY): 28 min   Charges:   PT Evaluation $PT Eval Low Complexity: 1 Low PT Treatments $Therapeutic Activity: 8-22 mins        Blondell Reveal Kistler PT 01/18/2020  Acute Rehabilitation Services Pager [phone] Office [phone]

## 2020-01-18 NOTE — Consult Note (Signed)
   [facility] CM Inpatient Consult   01/18/2020  Erica Escobar February 12, 1936 [id number]    THN: ACTIVE status:   Patient is currently active with [facility]) Care Management services. Alerted of admission by [facility] RN CM coordinator who recently engaged patient forcare coordinationand chronic disease management.  Patientchecked for24%highrisk score for unplanned readmission, 3 hospitalizations in the past 6 months and a 30 day readmission, under her [employer].  Review of medical record shows MDnotes as follows: Patient had recent admission for acute on chronic respiratory failure secondary to anasarca with recurrent right sided pleural effusions/hepatic hydrothorax status post thoracentesis 1/18 and 01/05/20 (was discharged home with home health- [facility]). Patient readmitted with hyperkalemia, recent multiple falls, orthostasis, non alcoholic cirrhosis and constipation.    Primary [place] with [facility].  PT evaluation note showing current recommendation for home healthPT vs SNF (skilled nursing facility). Transition of care SW note reviewed, reveals current discharge plan is home with Hospice Care [facility]).  Plan: Follow for disposition recommendation.  If patient will remain with [facility] for Hospice care, will sign off. If plan changes, will make outreach attempts. Will update [facility])RN care Nurse, children's of disposition plan.   For questions oradditional information,please contact:  Fraida Veldman A. Intisar Claudio, BSN, RN-BC [facility] Liaison Cell: [phone]

## 2020-01-19 LAB — BASIC METABOLIC PANEL
Anion gap: 9 (ref 5–15)
BUN: 53 mg/dL — ABNORMAL HIGH (ref 8–23)
CO2: 24 mmol/L (ref 22–32)
Calcium: 8.5 mg/dL — ABNORMAL LOW (ref 8.9–10.3)
Chloride: 105 mmol/L (ref 98–111)
Creatinine, Ser: 1.84 mg/dL — ABNORMAL HIGH (ref 0.44–1.00)
GFR calc Af Amer: 29 mL/min — ABNORMAL LOW (ref >60)
GFR calc non Af Amer: 25 mL/min — ABNORMAL LOW (ref >60)
Glucose, Bld: 106 mg/dL — ABNORMAL HIGH (ref 70–99)
Potassium: 3.8 mmol/L (ref 3.5–5.1)
Sodium: 138 mmol/L (ref 135–145)

## 2020-01-19 MED ORDER — ACETAMINOPHEN 500 MG PO TABS: 500.0000 mg | ORAL_TABLET | Freq: Four times a day (QID) | ORAL | 2 refills | Status: DC | PRN

## 2020-01-19 MED ORDER — LIDOCAINE 5 % EX PTCH: 1.0000 | MEDICATED_PATCH | Freq: Two times a day (BID) | CUTANEOUS | 0 refills | Status: AC

## 2020-01-19 MED ORDER — POTASSIUM CHLORIDE CRYS ER 10 MEQ PO TBCR: 10.0000 meq | EXTENDED_RELEASE_TABLET | Freq: Every day | ORAL | 0 refills | Status: DC

## 2020-01-19 MED ORDER — ACETAMINOPHEN 325 MG PO TABS
650.0000 mg | ORAL_TABLET | Freq: Once | ORAL | Status: AC
  Administered 2020-01-19: 16:00:00 650 mg via ORAL
  Filled 2020-01-19: qty 2

## 2020-01-19 NOTE — Plan of Care (Signed)

## 2020-01-19 NOTE — TOC Transition Note (Signed)
Transition of Care [facility]) - CM/SW Discharge Note   Patient Details  Name: Erica Escobar MRN: [id number] Date of Birth: 12-15-1935  Transition of Care [facility]) CM/SW Contact:  Trish Mage, LCSW Phone Number: 01/19/2020, 10:51 AM   Clinical Narrative:   Patient to d/c home today with hospice care.  Oriska at [facility] to confirm, and she will contact [place] office to alert them, and they will then coordinate with family. Thanks to RN, who contacted family to confirm d/c, as well as transportation. TOC sign off.          Final next level of care: Home w Hospice Care Barriers to Discharge: No Barriers Identified   Patient Goals and CMS Choice        Discharge Placement                       Discharge Plan and Services   Discharge Planning Services: CM Consult                                 Social Determinants of Health (SDOH) Interventions     Readmission Risk Interventions Readmission Risk Prevention Plan 01/19/2020 01/06/2020  Transportation Screening Complete Complete  PCP or Specialist Appt within 3-5 Days Complete Not Complete  Not Complete comments - Offices not open day of d/c  [place] or [place] Complete Complete  Social Work Consult for [place] Planning/Counseling Complete Complete  Palliative Care Screening Complete Not Applicable  Medication Review [employer]) Complete Complete  Some recent data might be hidden

## 2020-01-19 NOTE — Discharge Summary (Addendum)
Physician Discharge Summary  Erica Escobar DJS:[id number] DOB: Jun 24, 1936 DOA: 01/17/2020  PCP: Deland Pretty, MD  Admit date: 01/17/2020 Discharge date: 01/19/2020   Code Status: DNR  Admitted From: Home Discharged to: Home with hospice Discharge Condition: Stable, poor prognosis  Recommendations for Outpatient Follow-up   Pain management per hospice services  Hospital Summary  This is a very pleasant 84 year old female who lives at home alone with history of chronic orthostatic hypotension, hyperlipidemia, COPD, nonalcoholic liver cirrhosis, non-Hodgkin's lymphoma, colon cancer status post right colectomy (2016), CKD 3, anemia, GERD, recent admission for acute on chronic respiratory failure secondary to anasarca with recurrent right sided pleural effusions/hepatic hydrothorax status post thoracentesis 1/18 and 01/05/20 and started on Lasix, spironolactone with recent outpatient lab work showing K6.9 and BUN/creatinine 42/2.0.  K 5.3 on presentation to [facility] ED. Also reported multiple falls at home Given Lasix and held potassium and sprionolactone.    Palliative care consulted and recommending Home with Hospice and given Fentanyl PRN for right hip pain  Patient discharged with medication reconciliation as below and off spironolactone.  Hospice is to follow-up with patient in a.m.  A & P   Active Problems:   Pressure injury of skin   Hyperkalemia     Hyperkalemia likely from potassium supplementation and spironolactone, resolved with Lasix Reportedly outpatient lab K6.9, possibly lab error as K5.3 on admission Continue to hold spironolactone and decreased potassium supplement while on Lasix at home Multiple falls, likely orthostatic, in setting of history of chronic orthostasis  Continue home midodrine and Florinef Home with hospice Back pain, probably from degenerative disc disease and recent fall No fracture on Xray Fentanyl Prn added by palliative during hospitalization, has multiple  adverse reactions to opiates and discharged with Tylenol and Lidoderm patch Further pain management per hospice AKI on CKD 3b, probably from dehydration Improved with IV fluids Stage I sacral pressure ulcer noted by nursing on 2/3, present on admission Agree with management Nonalcoholic cirrhosis Hold spironolactone Continue Lasix and low-dose potassium for now Constipation Continue bowel regimen History of colon cancer and non-Hodgkin's lymphoma Poor prognosis, home with hospice     Consultants  Palliative care  Procedures  None  Antibiotics  None   Subjective  Patient evaluated at bedside, resting comfortably.  States she just does not feel well in general but is anticipating discharged home.  She states that her pain was somewhat improved with pain regimen but has been having chronic pain for months to years.  No fever, chills or night sweats.  No shortness of breath or chest pain.  She is very hard of hearing.  Objective   Discharge Exam: Vitals:   01/19/20 0558 01/19/20 1405  BP: (!) 120/53 107/65  Pulse: 77 84  Resp: 18 16  Temp: 97.9 F (36.6 C) 98.6 F (37 C)  SpO2: 100% 96%   Vitals:   01/18/20 1945 01/18/20 1951 01/19/20 0558 01/19/20 1405  BP:  110/64 (!) 120/53 107/65  Pulse:  72 77 84  Resp:  20 18 16   Temp:  98.2 F (36.8 C) 97.9 F (36.6 C) 98.6 F (37 C)  TempSrc:   Oral Oral  SpO2:  100% 100% 96%  Weight:      Height: 5' 3"  (1.6 m)       Physical Exam Vitals and nursing note reviewed.  Constitutional:      Comments: Frail elderly female  HENT:     Head: Normocephalic.     Mouth/Throat:  Comments: Wearing mask Eyes:     Conjunctiva/sclera: Conjunctivae normal.  Cardiovascular:     Rate and Rhythm: Normal rate and regular rhythm.  Pulmonary:     Effort: Pulmonary effort is normal.     Breath sounds: Normal breath sounds.  Abdominal:     General: There is no distension.     Tenderness: There is no abdominal tenderness.   Musculoskeletal:     Comments: Tenderness to palpation of right hip  Skin:    Coloration: Skin is not jaundiced or pale.  Neurological:     Mental Status: She is alert. Mental status is at baseline.  Psychiatric:        Mood and Affect: Mood normal.        Behavior: Behavior normal.       The results of significant diagnostics from this hospitalization (including imaging, microbiology, ancillary and laboratory) are listed below for reference.     Microbiology: Recent Results (from the past 240 hour(s))  Respiratory Panel by RT PCR (Flu A&B, Covid) - Nasopharyngeal Swab     Status: None   Collection Time: 01/17/20 12:16 PM   Specimen: Nasopharyngeal Swab  Result Value Ref Range Status   SARS Coronavirus 2 by RT PCR NEGATIVE NEGATIVE Final    Comment: (NOTE) SARS-CoV-2 target nucleic acids are NOT DETECTED. The SARS-CoV-2 RNA is generally detectable in upper respiratoy specimens during the acute phase of infection. The lowest concentration of SARS-CoV-2 viral copies this assay can detect is 131 copies/mL. A negative result does not preclude SARS-Cov-2 infection and should not be used as the sole basis for treatment or other patient management decisions. A negative result may occur with  improper specimen collection/handling, submission of specimen other than nasopharyngeal swab, presence of viral mutation(s) within the areas targeted by this assay, and inadequate number of viral copies (<131 copies/mL). A negative result must be combined with clinical observations, patient history, and epidemiological information. The expected result is Negative. Fact Sheet for Patients:  PinkCheek.be Fact Sheet for Healthcare Providers:  GravelBags.it This test is not yet ap proved or cleared by the [place] FDA and  has been authorized for detection and/or diagnosis of SARS-CoV-2 by FDA under an Emergency Use Authorization  (EUA). This EUA will remain  in effect (meaning this test can be used) for the duration of the COVID-19 declaration under Section 564(b)(1) of the Act, 21 U.S.C. section 360bbb-3(b)(1), unless the authorization is terminated or revoked sooner.    Influenza A by PCR NEGATIVE NEGATIVE Final   Influenza B by PCR NEGATIVE NEGATIVE Final    Comment: (NOTE) The Xpert Xpress SARS-CoV-2/FLU/RSV assay is intended as an aid in  the diagnosis of influenza from Nasopharyngeal swab specimens and  should not be used as a sole basis for treatment. Nasal washings and  aspirates are unacceptable for Xpert Xpress SARS-CoV-2/FLU/RSV  testing. Fact Sheet for Patients: PinkCheek.be Fact Sheet for Healthcare Providers: GravelBags.it This test is not yet approved or cleared by the [place] FDA and  has been authorized for detection and/or diagnosis of SARS-CoV-2 by  FDA under an Emergency Use Authorization (EUA). This EUA will remain  in effect (meaning this test can be used) for the duration of the  Covid-19 declaration under Section 564(b)(1) of the Act, 21  U.S.C. section 360bbb-3(b)(1), unless the authorization is  terminated or revoked. Performed at [facility], [place] [address], [place], [place] [address]      Labs: BNP (last 3 results) Recent Labs  09/11/19 1631 10/12/19 1852 01/01/20 1141  BNP 73.2 57.7 30.8   Basic Metabolic Panel: Recent Labs  Lab 01/17/20 1159 01/18/20 0546 01/19/20 0530  NA 141 138 138  K 5.3* 5.1 3.8  CL 104 104 105  CO2 26 25 24   GLUCOSE 99 103* 106*  BUN 48* 54* 53*  CREATININE 1.76* 2.18* 1.84*  CALCIUM 9.7 8.9 8.5*  MG  --  2.0  --   PHOS  --  3.6  --    Liver Function Tests: Recent Labs  Lab 01/17/20 1159 01/18/20 0546  AST 59* 43*  ALT 57* 47*  ALKPHOS 426* 320*  BILITOT 1.7* 2.0*  PROT 7.2 5.8*  ALBUMIN 4.2 3.3*   No results for input(s): LIPASE, AMYLASE  in the last 168 hours. No results for input(s): AMMONIA in the last 168 hours. CBC: Recent Labs  Lab 01/17/20 1159 01/18/20 0546  WBC 3.3* 3.3*  HGB 15.0 12.5  HCT 44.5 36.1  MCV 100.7* 100.3*  PLT 144* 127*   Cardiac Enzymes: No results for input(s): CKTOTAL, CKMB, CKMBINDEX, TROPONINI in the last 168 hours. BNP: Invalid input(s): POCBNP CBG: No results for input(s): GLUCAP in the last 168 hours. D-Dimer No results for input(s): DDIMER in the last 72 hours. Hgb A1c No results for input(s): HGBA1C in the last 72 hours. Lipid Profile No results for input(s): CHOL, HDL, LDLCALC, TRIG, CHOLHDL, LDLDIRECT in the last 72 hours. Thyroid function studies No results for input(s): TSH, T4TOTAL, T3FREE, THYROIDAB in the last 72 hours.  Invalid input(s): FREET3 Anemia work up No results for input(s): VITAMINB12, FOLATE, FERRITIN, TIBC, IRON, RETICCTPCT in the last 72 hours. Urinalysis    Component Value Date/Time   COLORURINE YELLOW 01/01/2020 [place] 01/01/2020 1313   LABSPEC 1.008 01/01/2020 1313   PHURINE 5.0 01/01/2020 1313   GLUCOSEU NEGATIVE 01/01/2020 1313   HGBUR NEGATIVE 01/01/2020 1313   BILIRUBINUR NEGATIVE 01/01/2020 1313   KETONESUR NEGATIVE 01/01/2020 1313   PROTEINUR NEGATIVE 01/01/2020 1313   UROBILINOGEN 0.2 03/10/2009 1608   NITRITE NEGATIVE 01/01/2020 1313   LEUKOCYTESUR NEGATIVE 01/01/2020 1313   Sepsis Labs Invalid input(s): PROCALCITONIN,  WBC,  LACTICIDVEN Microbiology Recent Results (from the past 240 hour(s))  Respiratory Panel by RT PCR (Flu A&B, Covid) - Nasopharyngeal Swab     Status: None   Collection Time: 01/17/20 12:16 PM   Specimen: Nasopharyngeal Swab  Result Value Ref Range Status   SARS Coronavirus 2 by RT PCR NEGATIVE NEGATIVE Final    Comment: (NOTE) SARS-CoV-2 target nucleic acids are NOT DETECTED. The SARS-CoV-2 RNA is generally detectable in upper respiratoy specimens during the acute phase of infection. The  lowest concentration of SARS-CoV-2 viral copies this assay can detect is 131 copies/mL. A negative result does not preclude SARS-Cov-2 infection and should not be used as the sole basis for treatment or other patient management decisions. A negative result may occur with  improper specimen collection/handling, submission of specimen other than nasopharyngeal swab, presence of viral mutation(s) within the areas targeted by this assay, and inadequate number of viral copies (<131 copies/mL). A negative result must be combined with clinical observations, patient history, and epidemiological information. The expected result is Negative. Fact Sheet for Patients:  PinkCheek.be Fact Sheet for Healthcare Providers:  GravelBags.it This test is not yet ap proved or cleared by the [place] FDA and  has been authorized for detection and/or diagnosis of SARS-CoV-2 by FDA under an Emergency Use Authorization (EUA). This EUA will  remain  in effect (meaning this test can be used) for the duration of the COVID-19 declaration under Section 564(b)(1) of the Act, 21 U.S.C. section 360bbb-3(b)(1), unless the authorization is terminated or revoked sooner.    Influenza A by PCR NEGATIVE NEGATIVE Final   Influenza B by PCR NEGATIVE NEGATIVE Final    Comment: (NOTE) The Xpert Xpress SARS-CoV-2/FLU/RSV assay is intended as an aid in  the diagnosis of influenza from Nasopharyngeal swab specimens and  should not be used as a sole basis for treatment. Nasal washings and  aspirates are unacceptable for Xpert Xpress SARS-CoV-2/FLU/RSV  testing. Fact Sheet for Patients: PinkCheek.be Fact Sheet for Healthcare Providers: GravelBags.it This test is not yet approved or cleared by the [place] FDA and  has been authorized for detection and/or diagnosis of SARS-CoV-2 by  FDA under an Emergency  Use Authorization (EUA). This EUA will remain  in effect (meaning this test can be used) for the duration of the  Covid-19 declaration under Section 564(b)(1) of the Act, 21  U.S.C. section 360bbb-3(b)(1), unless the authorization is  terminated or revoked. Performed at [facility], [place] [address] [place], [place] [address]     Discharge Instructions     Discharge Instructions     Diet - low sodium heart healthy   Complete by: As directed    Discharge instructions   Complete by: As directed    You were seen and examined in the hospital for high potassium and cared for by a hospitalist.   Upon Discharge:  -Stop taking spironolactone -Decrease your potassium supplement use -You have had multiple other medication changes, please see discharge summary for full list  Read the complete instructions along with all the possible side effects for all the medicines you take and that have been prescribed to you. Take any new medicines after you have completely understood and accept all the possible adverse reactions/side effects.   If you have any questions about your discharge medications or the care you received while you were in the hospital, you can call the unit and asked to speak with the hospitalist on call. Once you are discharged, your primary care physician will handle any further medical issues. Please note that NO REFILLS for any discharge medications will be authorized, as it is imperative that you return to your primary care physician (or establish a relationship with a primary care physician if you do not have one) for your aftercare needs so that they can reassess your need for medications and monitor your lab values.   Do not drive, operate heavy machinery, perform activities at heights, swimming or participation in water activities or provide baby sitting services if your were admitted for loss of consciousness/seizures or if you are on sedating medications  including, but not limited to benzodiazepines, sleep medications, narcotic pain medications, etc., until you have been cleared to do so by a medical doctor.    Do not take more than prescribed medications.   Wear a seat belt while driving.  If you have smoked or chewed Tobacco  in the last 2 years please stop smoking; also stop any regular Alcohol and/or any Recreational drug use including marijuana.  If you experience worsening of your admission symptoms or develop shortness of breath, chest pain, suicidal or homicidal thoughts or experience a life threatening emergency, you must seek medical attention immediately by calling 911 or calling your PCP immediately.   Increase activity slowly   Complete by: As directed  Allergies as of 01/19/2020       Reactions   Azithromycin Itching   Allopurinol Other (See Comments)   Unknown   Cephalexin Other (See Comments)   Unknown   Codeine Itching   Gabapentin Swelling, Other (See Comments)   Throat and leg swelling   Niaspan [niacin] Other (See Comments)   Unknown   Oxybutynin Other (See Comments)   "made me go crazy"-- had crazy dreams.    Paxil [paroxetine Hcl] Other (See Comments)   Unknown reaction   Penicillins Itching, Other (See Comments)   Has patient had a PCN reaction causing immediate rash, facial/tongue/throat swelling, SOB or lightheadedness with hypotension: No Has patient had a PCN reaction causing severe rash involving mucus membranes or skin necrosis: Yes Has patient had a PCN reaction that required hospitalization: No Has patient had a PCN reaction occurring within the last 10 years: No If all of the above answers are "NO", then may proceed with Cephalosporin use.   Pentazocine Itching, Nausea Only, Other (See Comments)   Headache.    Tizanidine Other (See Comments)   "made me crazy"   Tramadol Other (See Comments)   Head feels "swimmy" and weakness   Vicodin [hydrocodone-acetaminophen] Other (See Comments)    "made me go crazy"        Medication List     STOP taking these medications    betamethasone valerate ointment 0.1 % Commonly known as: VALISONE   Fish Oil 1000 MG Caps   loratadine 10 MG tablet Commonly known as: CLARITIN   multivitamin with minerals Tabs tablet   Refresh 1.4-0.6 % Soln Generic drug: Polyvinyl Alcohol-Povidone PF   spironolactone 100 MG tablet Commonly known as: ALDACTONE   vitamin B-12 1000 MCG tablet Commonly known as: CYANOCOBALAMIN   vitamin C 500 MG tablet Commonly known as: ASCORBIC ACID       TAKE these medications    acetaminophen 500 MG tablet Commonly known as: TYLENOL Take 1 tablet (500 mg total) by mouth every 6 (six) hours as needed for mild pain or moderate pain.   calcium-vitamin D 250-125 MG-UNIT tablet Commonly known as: OSCAL WITH D Take 1 tablet by mouth daily.   Ensure Take 237-474 mLs by mouth daily. VANILLA OR STRAWBERRY IF AVAILABLE PO DAILY   fludrocortisone 0.1 MG tablet Commonly known as: FLORINEF Take 200 mcg by mouth daily.   furosemide 40 MG tablet Commonly known as: LASIX Take 1 tablet (40 mg total) by mouth daily.   lactulose 10 GM/15ML solution Commonly known as: CHRONULAC Take 20 g by mouth daily as needed for mild constipation.   lidocaine 5 % Commonly known as: Lidoderm Place 1 patch onto the skin every 12 (twelve) hours for 5 days. Remove & Discard patch within 12 hours or as directed by MD   midodrine 2.5 MG tablet Commonly known as: PROAMATINE Take 1 tablet (2.5 mg total) by mouth 2 (two) times daily with a meal.   ondansetron 4 MG tablet Commonly known as: Zofran Take 1 tablet (4 mg total) by mouth every 8 (eight) hours as needed for nausea or vomiting.   pantoprazole 40 MG tablet Commonly known as: PROTONIX Take 40 mg by mouth daily.   polyethylene glycol 17 g packet Commonly known as: MIRALAX / GLYCOLAX Take 17 g by mouth daily as needed for moderate constipation.   potassium  chloride 10 MEQ tablet Commonly known as: KLOR-CON Take 1 tablet (10 mEq total) by mouth daily for 14 days. What changed:  medication strength how much to take when to take this         Allergies  Allergen Reactions   Azithromycin Itching   Allopurinol Other (See Comments)    Unknown   Cephalexin Other (See Comments)    Unknown   Codeine Itching   Gabapentin Swelling and Other (See Comments)    Throat and leg swelling   Niaspan [Niacin] Other (See Comments)    Unknown   Oxybutynin Other (See Comments)    "made me go crazy"-- had crazy dreams.    Paxil [Paroxetine Hcl] Other (See Comments)    Unknown reaction   Penicillins Itching and Other (See Comments)    Has patient had a PCN reaction causing immediate rash, facial/tongue/throat swelling, SOB or lightheadedness with hypotension: No Has patient had a PCN reaction causing severe rash involving mucus membranes or skin necrosis: Yes Has patient had a PCN reaction that required hospitalization: No Has patient had a PCN reaction occurring within the last 10 years: No If all of the above answers are "NO", then may proceed with Cephalosporin use.    Pentazocine Itching, Nausea Only and Other (See Comments)    Headache.    Tizanidine Other (See Comments)    "made me crazy"   Tramadol Other (See Comments)    Head feels "swimmy" and weakness   Vicodin [Hydrocodone-Acetaminophen] Other (See Comments)    "made me go crazy"    Time coordinating discharge: Less than 30 minutes   SIGNED:   Harold Hedge, D.O. [facility] Pager: [phone]  01/19/2020, 4:14 PM

## 2020-01-22 ENCOUNTER — Other Ambulatory Visit: Payer: Self-pay | Admitting: *Deleted

## 2020-01-22 NOTE — Patient Outreach (Signed)
Outreach call to patient's son Alveda Reasons to confirm pt did discharge from hospital on 01/19/20 with hospice at home through [facility], per Mr. Susann Givens, pt does now have hospice services and no further needs for [facility] care management.   RN CM faxed case closure letter to primary care provider and mailed case closure letter to pt home.  Case closed  Jacqlyn Larsen [facility], [place] Coordinator [phone]

## 2020-01-25 ENCOUNTER — Other Ambulatory Visit: Payer: Medicare Other | Admitting: Adult Health Nurse Practitioner

## 2020-02-03 ENCOUNTER — Emergency Department (HOSPITAL_COMMUNITY)

## 2020-02-03 ENCOUNTER — Encounter (HOSPITAL_COMMUNITY): Payer: Self-pay | Admitting: Emergency Medicine

## 2020-02-03 ENCOUNTER — Emergency Department (HOSPITAL_COMMUNITY)
Admission: EM | Admit: 2020-02-03 | Discharge: 2020-02-03 | Disposition: A | Discharge status: Home / Self Care | Attending: Emergency Medicine | Admitting: Emergency Medicine

## 2020-02-03 ENCOUNTER — Other Ambulatory Visit: Payer: Self-pay

## 2020-02-03 DIAGNOSIS — J90 Pleural effusion, not elsewhere classified: Secondary | ICD-10-CM

## 2020-02-03 DIAGNOSIS — Z85038 Personal history of other malignant neoplasm of large intestine: Secondary | ICD-10-CM | POA: Insufficient documentation

## 2020-02-03 DIAGNOSIS — R0600 Dyspnea, unspecified: Secondary | ICD-10-CM | POA: Diagnosis not present

## 2020-02-03 DIAGNOSIS — Z8572 Personal history of non-Hodgkin lymphomas: Secondary | ICD-10-CM | POA: Insufficient documentation

## 2020-02-03 DIAGNOSIS — N183 Chronic kidney disease, stage 3 unspecified: Secondary | ICD-10-CM | POA: Insufficient documentation

## 2020-02-03 DIAGNOSIS — Z85828 Personal history of other malignant neoplasm of skin: Secondary | ICD-10-CM | POA: Insufficient documentation

## 2020-02-03 DIAGNOSIS — Z515 Encounter for palliative care: Secondary | ICD-10-CM | POA: Diagnosis not present

## 2020-02-03 DIAGNOSIS — J910 Malignant pleural effusion: Secondary | ICD-10-CM | POA: Diagnosis not present

## 2020-02-03 DIAGNOSIS — Z87891 Personal history of nicotine dependence: Secondary | ICD-10-CM | POA: Diagnosis not present

## 2020-02-03 DIAGNOSIS — Z79899 Other long term (current) drug therapy: Secondary | ICD-10-CM | POA: Diagnosis not present

## 2020-02-03 DIAGNOSIS — J449 Chronic obstructive pulmonary disease, unspecified: Secondary | ICD-10-CM | POA: Diagnosis not present

## 2020-02-03 DIAGNOSIS — R0602 Shortness of breath: Secondary | ICD-10-CM | POA: Diagnosis not present

## 2020-02-03 DIAGNOSIS — R918 Other nonspecific abnormal finding of lung field: Secondary | ICD-10-CM | POA: Diagnosis not present

## 2020-02-03 DIAGNOSIS — R0609 Other forms of dyspnea: Secondary | ICD-10-CM

## 2020-02-03 DIAGNOSIS — I129 Hypertensive chronic kidney disease with stage 1 through stage 4 chronic kidney disease, or unspecified chronic kidney disease: Secondary | ICD-10-CM | POA: Diagnosis not present

## 2020-02-03 LAB — COMPREHENSIVE METABOLIC PANEL
ALT: 56 U/L — ABNORMAL HIGH (ref 0–44)
AST: 71 U/L — ABNORMAL HIGH (ref 15–41)
Albumin: 3.1 g/dL — ABNORMAL LOW (ref 3.5–5.0)
Alkaline Phosphatase: 537 U/L — ABNORMAL HIGH (ref 38–126)
Anion gap: 9 (ref 5–15)
BUN: 40 mg/dL — ABNORMAL HIGH (ref 8–23)
CO2: 25 mmol/L (ref 22–32)
Calcium: 8.4 mg/dL — ABNORMAL LOW (ref 8.9–10.3)
Chloride: 108 mmol/L (ref 98–111)
Creatinine, Ser: 1.44 mg/dL — ABNORMAL HIGH (ref 0.44–1.00)
GFR calc Af Amer: 39 mL/min — ABNORMAL LOW (ref >60)
GFR calc non Af Amer: 34 mL/min — ABNORMAL LOW (ref >60)
Glucose, Bld: 104 mg/dL — ABNORMAL HIGH (ref 70–99)
Potassium: 3.5 mmol/L (ref 3.5–5.1)
Sodium: 142 mmol/L (ref 135–145)
Total Bilirubin: 2.3 mg/dL — ABNORMAL HIGH (ref 0.3–1.2)
Total Protein: 5.8 g/dL — ABNORMAL LOW (ref 6.5–8.1)

## 2020-02-03 LAB — CBC
HCT: 34.9 % — ABNORMAL LOW (ref 36.0–46.0)
Hemoglobin: 11.9 g/dL — ABNORMAL LOW (ref 12.0–15.0)
MCH: 34.4 pg — ABNORMAL HIGH (ref 26.0–34.0)
MCHC: 34.1 g/dL (ref 30.0–36.0)
MCV: 100.9 fL — ABNORMAL HIGH (ref 80.0–100.0)
Platelets: 113 10*3/uL — ABNORMAL LOW (ref 150–400)
RBC: 3.46 MIL/uL — ABNORMAL LOW (ref 3.87–5.11)
RDW: 15.9 % — ABNORMAL HIGH (ref 11.5–15.5)
WBC: 4.1 10*3/uL (ref 4.0–10.5)
nRBC: 0 % (ref 0.0–0.2)

## 2020-02-03 LAB — BRAIN NATRIURETIC PEPTIDE: B Natriuretic Peptide: 84.1 pg/mL (ref 0.0–100.0)

## 2020-02-03 LAB — TROPONIN I (HIGH SENSITIVITY): Troponin I (High Sensitivity): 8 ng/L (ref <18)

## 2020-02-03 MED ORDER — FENTANYL CITRATE (PF) 100 MCG/2ML IJ SOLN
25.0000 ug | Freq: Once | INTRAMUSCULAR | Status: DC
  Filled 2020-02-03: qty 2

## 2020-02-03 NOTE — ED Triage Notes (Signed)
Patient BIB son, sent by hospice as patient complains of worsening SOB. Patient is hard of hearing.

## 2020-02-03 NOTE — Consult Note (Addendum)
NAME:  Erica Escobar, MRN:  [id number], DOB:  20-Feb-1936, LOS: 0 ADMISSION DATE:  02/03/2020, CONSULTATION DATE:  02/03/2020 REFERRING MD:  EDP, CHIEF COMPLAINT: Pleural effusion, thoracentesis  Brief History   84 year old female with history of chronic orthostatic hypotension, hyperlipidemia, COPD, nonalcoholic liver cirrhosis, non-Hodgkin's lymphoma, colon cancer status post right colectomy (2016), CKD 3, anemia, GERD, recent admissions for acute on chronic respiratory failure secondary to anasarca with recurrent right sided pleural effusions/hepatic hydrothorax status post thoracentesis 1/18 and 01/05/20  She had another admission in early February 2021 and goals of care changed to comfort and transitioned to hospice at home.  She now presents to the ED with increasing dyspnea and large right-sided effusion.  PCCM consulted for help with thoracentesis  Past Medical History    has a past medical history of Abnormal tympanic membrane, Arthritis, Cancer of ascending colon ([place]) (02/15/2015), Constipation, chronic, COPD (chronic obstructive pulmonary disease) ([place]), Depression, Diverticulosis, GERD (gastroesophageal reflux disease), Glaucoma, Hemorrhoids, History of hiatal hernia, Hyperlipidemia, Hypertension, Insomnia, Keratosis, actinic, Leg cramps, Macular degeneration, Neuromuscular disorder ([place]), On aspirin at home, PONV (postoperative nausea and vomiting), Restless leg syndrome, Schatzki's ring (1987), Skin cancer, and Urinary incontinence.  Significant Hospital Events   2/20- EDP eval for thoracentesis  Consults:  PCCM  Procedures:    Significant Diagnostic Tests:    Micro Data:    Antimicrobials:    Interim history/subjective:    Objective   Blood pressure 130/74, pulse 88, temperature (!) 97.3 F (36.3 C), resp. rate 19, SpO2 98 %.       No intake or output data in the 24 hours ending 02/03/20 1832 There were no vitals filed for this visit.  Examination: Blood  pressure 130/74, pulse 88, temperature (!) 97.3 F (36.3 C), resp. rate 19, SpO2 98 %. Gen:      No acute distress HEENT:  EOMI, sclera anicteric Neck:     No masses; no thyromegaly Lungs:    Clear to auscultation bilaterally; normal respiratory effort CV:         Regular rate and rhythm; no murmurs Abd:      + bowel sounds; soft, non-tender; no palpable masses, no distension Ext:    No edema; adequate peripheral perfusion Skin:      Warm and dry; no rash Neuro: alert and oriented x 3 Psych: normal mood and affect  Resolved Hospital Problem list     Assessment & Plan:  Recurrent right pleural effusion, hepatic hydrothorax Respiratory failure  It would be reasonable to do Pleurx catheter for comfort as she is in hospice to prevent reaccumulation and recurrent ED visits/hospitalization and thoracentesis I have discussed this in detail with patient at bedside and her son over telephone but they are not sure if they want to go ahead with the procedure.  They want to learn more about it and consider if the fluid accumulates again.  We will proceed with thoracentesis for now.  If stable post procedure and post chest x ray is ok she can be discharged back to home from the ED  Labs   CBC: Recent Labs  Lab 02/03/20 1716  WBC 4.1  HGB 11.9*  HCT 34.9*  MCV 100.9*  PLT 113*    Basic Metabolic Panel: Recent Labs  Lab 02/03/20 1553  NA 142  K 3.5  CL 108  CO2 25  GLUCOSE 104*  BUN 40*  CREATININE 1.44*  CALCIUM 8.4*   GFR: CrCl cannot be calculated (Unknown ideal weight.). Recent Labs  Lab 02/03/20 1716  WBC 4.1    Liver Function Tests: Recent Labs  Lab 02/03/20 1553  AST 71*  ALT 56*  ALKPHOS 537*  BILITOT 2.3*  PROT 5.8*  ALBUMIN 3.1*   No results for input(s): LIPASE, AMYLASE in the last 168 hours. No results for input(s): AMMONIA in the last 168 hours.  ABG    Component Value Date/Time   TCO2 26 03/10/2009 1712     Coagulation Profile: No results  for input(s): INR, PROTIME in the last 168 hours.  Cardiac Enzymes: No results for input(s): CKTOTAL, CKMB, CKMBINDEX, TROPONINI in the last 168 hours.  HbA1C: Hgb A1c MFr Bld  Date/Time Value Ref Range Status  02/11/2015 01:30 PM 5.1 4.8 - 5.6 % Final    Comment:    (NOTE)         Pre-diabetes: 5.7 - 6.4         Diabetes: >6.4         Glycemic control for adults with diabetes: <7.0   03/10/2009 11:59 PM  4.6 - 6.1 % Final   4.9 (NOTE)   The ADA recommends the following therapeutic goal for glycemic   control related to Hgb A1C measurement:   Goal of Therapy:   < 7.0% Hgb A1C   Reference: American Diabetes Association: Clinical Practice   Recommendations 2008, Diabetes Care,  2008, 31:(Suppl 1).    CBG: No results for input(s): GLUCAP in the last 168 hours.  Review of Systems:   REVIEW OF SYSTEMS:   All negative; except for those that are bolded, which indicate positives.  Constitutional: weight loss, weight gain, night sweats, fevers, chills, fatigue, weakness.  HEENT: headaches, sore throat, sneezing, nasal congestion, post nasal drip, difficulty swallowing, tooth/dental problems, visual complaints, visual changes, ear aches. Neuro: difficulty with speech, weakness, numbness, ataxia. CV:  chest pain, orthopnea, PND, swelling in lower extremities, dizziness, palpitations, syncope.  Resp: cough, hemoptysis, dyspnea, wheezing. GI: heartburn, indigestion, abdominal pain, nausea, vomiting, diarrhea, constipation, change in bowel habits, loss of appetite, hematemesis, melena, hematochezia.  GU: dysuria, change in color of urine, urgency or frequency, flank pain, hematuria. MSK: joint pain or swelling, decreased range of motion. Psych: change in mood or affect, depression, anxiety, suicidal ideations, homicidal ideations. Skin: rash, itching, bruising.  Past Medical History  She,  has a past medical history of Abnormal tympanic membrane, Arthritis, Cancer of ascending colon ([place])  (02/15/2015), Constipation, chronic, COPD (chronic obstructive pulmonary disease) ([place]), Depression, Diverticulosis, GERD (gastroesophageal reflux disease), Glaucoma, Hemorrhoids, History of hiatal hernia, Hyperlipidemia, Hypertension, Insomnia, Keratosis, actinic, Leg cramps, Macular degeneration, Neuromuscular disorder ([place]), On aspirin at home, PONV (postoperative nausea and vomiting), Restless leg syndrome, Schatzki's ring (1987), Skin cancer, and Urinary incontinence.   Surgical History    Past Surgical History:  Procedure Laterality Date  . ABDOMINAL HYSTERECTOMY    . BLADDER REPAIR    . CARDIAC CATHETERIZATION  2007, 2009  . CATARACT EXTRACTION Bilateral   . CHOLECYSTECTOMY    . COLECTOMY    . FINGER NAIL SURGERY    . FOOT SURGERY Right   . HEMORROIDECTOMY    . INNER EAR SURGERY Right    3-4 times  . INTRAOPERATIVE ARTERIOGRAM     right arm  . IR THORACENTESIS ASP PLEURAL SPACE W/IMG GUIDE  01/05/2020  . KNEE SURGERY Right   . LEG SURGERY Bilateral    laser ablation  . MASTOID DEBRIDEMENT     x2  . SKIN CANCER EXCISION     right/left  thumb nail, left face, right ear/neck, right leg  . TOE FUSION    . TONSILLECTOMY       Social History   reports that she has quit smoking. She has never used smokeless tobacco. She reports that she does not drink alcohol or use drugs.   Family History   Her family history includes Cancer in her brother, brother, and father; Clotting disorder in her child; Diabetes in her mother; Hypertension in an other family member.   Allergies Allergies  Allergen Reactions  . Azithromycin Itching  . Allopurinol Other (See Comments)    Unknown  . Cephalexin Other (See Comments)    Unknown  . Codeine Itching  . Gabapentin Swelling and Other (See Comments)    Throat and leg swelling  . Niaspan [Niacin] Other (See Comments)    Unknown  . Oxybutynin Other (See Comments)    "made me go crazy"-- had crazy dreams.   . Paxil [Paroxetine Hcl] Other (See  Comments)    Unknown reaction  . Penicillins Itching and Other (See Comments)    Has patient had a PCN reaction causing immediate rash, facial/tongue/throat swelling, SOB or lightheadedness with hypotension: No Has patient had a PCN reaction causing severe rash involving mucus membranes or skin necrosis: Yes Has patient had a PCN reaction that required hospitalization: No Has patient had a PCN reaction occurring within the last 10 years: No If all of the above answers are "NO", then may proceed with Cephalosporin use.   Marland Kitchen Pentazocine Itching, Nausea Only and Other (See Comments)    Headache.   . Tizanidine Other (See Comments)    "made me crazy"  . Tramadol Other (See Comments)    Head feels "swimmy" and weakness  . Vicodin [Hydrocodone-Acetaminophen] Other (See Comments)    "made me go crazy"     Home Medications  Prior to Admission medications   Medication Sig Start Date End Date Taking? Authorizing Provider  acetaminophen (TYLENOL) 500 MG tablet Take 1 tablet (500 mg total) by mouth every 6 (six) hours as needed for mild pain or moderate pain. 01/19/20 01/18/21 Yes Harold Hedge, MD  calcium-vitamin D (OSCAL WITH D) 250-125 MG-UNIT tablet Take 1 tablet by mouth daily.   Yes [provider]  ENSURE (ENSURE) Take [id number] mLs by mouth daily. VANILLA OR STRAWBERRY IF AVAILABLE PO DAILY    Yes [provider]  fludrocortisone (FLORINEF) 0.1 MG tablet Take 200 mcg by mouth daily. 12/12/19  Yes [provider]  furosemide (LASIX) 40 MG tablet Take 1 tablet (40 mg total) by mouth daily. 01/07/20  Yes Alma Friendly, MD  midodrine (PROAMATINE) 2.5 MG tablet Take 1 tablet (2.5 mg total) by mouth 2 (two) times daily with a meal. 10/19/19  Yes Guilford Shi, MD  lactulose (CHRONULAC) 10 GM/15ML solution Take 20 g by mouth daily as needed for mild constipation.  11/13/19   [provider]  Morphine Sulfate (MORPHINE CONCENTRATE) 10 mg / 0.5 ml concentrated  solution Take 2 mLs by mouth every 2 (two) hours as needed. 01/23/20   [provider]  ondansetron (ZOFRAN) 4 MG tablet Take 1 tablet (4 mg total) by mouth every 8 (eight) hours as needed for nausea or vomiting. 08/24/19   Ladell Pier, MD  pantoprazole (PROTONIX) 40 MG tablet Take 40 mg by mouth daily.    [provider]  polyethylene glycol (MIRALAX / GLYCOLAX) packet Take 17 g by mouth daily as needed for moderate constipation.  [provider]  potassium chloride SA (KLOR-CON) 10 MEQ tablet Take 1 tablet (10 mEq total) by mouth daily for 14 days. Patient not taking: Reported on 02/03/2020 01/19/20 02/02/20  Harold Hedge, MD    Marshell Garfinkel MD [facility] Please see [email or web address] for pager details.  02/03/2020, 6:39 PM

## 2020-02-03 NOTE — Procedures (Addendum)
Thoracentesis Procedure Note  Pre-operative Diagnosis: Pleural effusion   Post-operative Diagnosis: same  Indications: Resp insufficiency  Procedure Details  Consent: Informed consent was obtained. Risks of the procedure were discussed including: infection, bleeding, pain, pneumothorax.  Under sterile conditions the patient was positioned. Betadine solution and sterile drapes were utilized.  1% buffered lidocaine was used to anesthetize the inter rib space. Fluid was obtained without any difficulties and minimal blood loss.  A dressing was applied to the wound and wound care instructions were provided.   Findings 2000 ml of clear yellow transudate appearing pleural fluid was obtained.  Complications:  None; patient tolerated the procedure well.      Condition: stable  Plan A follow up chest x-ray was ordered. Bed Rest for 1 hours.  Marshell Garfinkel MD [facility] Please see [email or web address] for pager details.  02/03/2020, 7:20 PM

## 2020-02-03 NOTE — ED Provider Notes (Signed)
[facility] DEPT Provider Note   CSN: [id number] Arrival date & time: 02/03/20  1519     History Chief Complaint  Patient presents with  . Shortness of Breath    Erica Escobar is a 84 y.o. female.  Pt presents to the ED today with sob.  Pt has a hx of multiple medical problems including chronic orthostatic hypotension, hyperlipidemia, COPD, nonalcoholic liver cirrhosis, non-Hodgkin's lymphoma, colon cancer status post right colectomy (2016), CKD 3, anemia, GERD, recurrent right sided pleural effusions/hepatic hydrothorax status post thoracentesis 1/18 and 01/05/20.  Pt was admitted from 2/3 to 2/5 with anasarca and AKI.  She was d/c home with hospice.  Pt said hospice told her she might be getting full of fluid again.  Pt lives alone, but her son brought her.  I attempted to call him to get additional hx, but he did not answer.        Past Medical History:  Diagnosis Date  . Abnormal tympanic membrane    right ear and mastoid problems  . Arthritis    oseoarthritis  . Cancer of ascending colon ([place]) 02/15/2015  . Constipation, chronic   . COPD (chronic obstructive pulmonary disease) ([facility])    mild COPD  . Depression   . Diverticulosis   . GERD (gastroesophageal reflux disease)   . Glaucoma    bilateral-sugery with laser, using eye drops  . Hemorrhoids    Internal  . History of hiatal hernia    dx. '87 Schatzki's ring  . Hyperlipidemia    good control  . Hypertension   . Insomnia    hx. of  . Keratosis, actinic   . Leg cramps   . Macular degeneration   . Neuromuscular disorder ([facility])    neuropathy legs,   . On aspirin at home    chronic use  . PONV (postoperative nausea and vomiting)   . Restless leg syndrome   . Schatzki's ring 1987  . Skin cancer    multiple, "tx. area right anterior wrist"  . Urinary incontinence    occ, stress    Patient Active Problem List   Diagnosis Date Noted  . Hyperkalemia 01/17/2020  . Anasarca  01/01/2020  . DNR (do not resuscitate) 01/01/2020  . Recurrent right pleural effusion   . Hepatic cirrhosis ([place])   . Acute respiratory failure with hypoxia ([place]) 10/12/2019  . Pleural effusion on right 10/12/2019  . Cirrhosis of liver with ascites ([place]) 10/12/2019  . Pancytopenia ([place]) 10/12/2019  . Malnutrition of moderate degree 08/30/2018  . Weakness 08/30/2018  . Symptomatic anemia 08/29/2018  . Acute kidney injury ([place]) 08/14/2018  . Dehydration 08/14/2018  . FTT (failure to thrive) in adult 08/14/2018  . Pressure injury of skin 08/14/2018  . Acute back pain 07/23/2018  . CKD (chronic kidney disease), stage III 07/23/2018  . Anorexia 07/23/2018  . Hyperlipidemia 07/23/2018  . 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 Metastatic cancer ([place]) 07/12/2018  . Chronic back pain 07/12/2018  . Pathologic rib fracture, initial encounter 07/12/2018  . Chest wall mass 07/12/2018  . Cancer of ascending colon s/p robotic colectomy 02/15/2015 02/15/2015  . Restless leg syndrome   . Depression   . Insomnia   . COPD (chronic obstructive pulmonary disease) ([place])   . GERD (gastroesophageal reflux disease)     Past Surgical History:  Procedure Laterality Date  . ABDOMINAL HYSTERECTOMY    . BLADDER REPAIR    . CARDIAC CATHETERIZATION  2007, 2009  .  CATARACT EXTRACTION Bilateral   . CHOLECYSTECTOMY    . COLECTOMY    . FINGER NAIL SURGERY    . FOOT SURGERY Right   . HEMORROIDECTOMY    . INNER EAR SURGERY Right    3-4 times  . INTRAOPERATIVE ARTERIOGRAM     right arm  . IR THORACENTESIS ASP PLEURAL SPACE W/IMG GUIDE  01/05/2020  . KNEE SURGERY Right   . LEG SURGERY Bilateral    laser ablation  . MASTOID DEBRIDEMENT     x2  . SKIN CANCER EXCISION     right/left thumb nail, left face, right ear/neck, right leg  . TOE FUSION    . TONSILLECTOMY       OB History   No obstetric history on file.     Family History  Problem Relation Age of Onset  . Diabetes Mother   . Cancer  Father   . Cancer Brother   . Cancer Brother   . Clotting disorder Child   . Hypertension Other     Social History   Tobacco Use  . Smoking status: Former Research scientist (life sciences)  . Smokeless tobacco: Never Used  . Tobacco comment: short time x1 - during pregnancy  Substance Use Topics  . Alcohol use: No    Alcohol/week: 0.0 standard drinks  . Drug use: No    Home Medications Prior to Admission medications   Medication Sig Start Date End Date Taking? Authorizing Provider  acetaminophen (TYLENOL) 500 MG tablet Take 1 tablet (500 mg total) by mouth every 6 (six) hours as needed for mild pain or moderate pain. 01/19/20 01/18/21 Yes Harold Hedge, MD  calcium-vitamin D (OSCAL WITH D) 250-125 MG-UNIT tablet Take 1 tablet by mouth daily.   Yes [provider]  ENSURE (ENSURE) Take [phone] mLs by mouth daily. VANILLA OR STRAWBERRY IF AVAILABLE PO DAILY    Yes [provider]  fludrocortisone (FLORINEF) 0.1 MG tablet Take 200 mcg by mouth daily. 12/12/19  Yes [provider]  furosemide (LASIX) 40 MG tablet Take 1 tablet (40 mg total) by mouth daily. 01/07/20  Yes Alma Friendly, MD  lactulose (CHRONULAC) 10 GM/15ML solution Take 20 g by mouth daily as needed for mild constipation.  11/13/19  Yes [provider]  Lidocaine 4 % PTCH Place 1 patch onto the skin daily. Remove & Discard patch within 12 hours or as directed by MD   Yes [provider]  midodrine (PROAMATINE) 2.5 MG tablet Take 1 tablet (2.5 mg total) by mouth 2 (two) times daily with a meal. 10/19/19  Yes Guilford Shi, MD  mirtazapine (REMERON) 15 MG tablet Take 15 mg by mouth at bedtime.   Yes [provider]  Morphine Sulfate (MORPHINE CONCENTRATE) 10 mg / 0.5 ml concentrated solution Take 2 mLs by mouth every 2 (two) hours as needed. 01/23/20  Yes [provider]  ondansetron (ZOFRAN) 4 MG tablet Take 1 tablet (4 mg total) by mouth every 8 (eight) hours as needed for nausea or  vomiting. 08/24/19  Yes Ladell Pier, MD  pantoprazole (PROTONIX) 40 MG tablet Take 40 mg by mouth daily.   Yes [provider]  potassium chloride SA (KLOR-CON) 10 MEQ tablet Take 1 tablet (10 mEq total) by mouth daily for 14 days. Patient not taking: Reported on 02/03/2020 01/19/20 02/02/20  Harold Hedge, MD    Allergies    Azithromycin, Allopurinol, Cephalexin, Codeine, Gabapentin, Niaspan [niacin], Oxybutynin, Paxil [paroxetine hcl], Penicillins, Pentazocine, Tizanidine, Tramadol, and Vicodin [  hydrocodone-acetaminophen]  Review of Systems   Review of Systems  Respiratory: Positive for shortness of breath.   All other systems reviewed and are negative.   Physical Exam Updated Vital Signs BP (!) 108/94   Pulse 89   Temp (!) 97.3 F (36.3 C)   Resp 17   SpO2 98%   Physical Exam Vitals and nursing note reviewed.  Constitutional:      Appearance: She is well-developed.     Comments: Chronically ill appearing  HENT:     Head: Normocephalic and atraumatic.     Mouth/Throat:     Mouth: Mucous membranes are moist.     Pharynx: Oropharynx is clear.  Eyes:     Extraocular Movements: Extraocular movements intact.     Pupils: Pupils are equal, round, and reactive to light.  Cardiovascular:     Rate and Rhythm: Normal rate and regular rhythm.  Pulmonary:     Effort: Tachypnea present.  Abdominal:     General: Bowel sounds are normal.     Palpations: Abdomen is soft.  Musculoskeletal:        General: Normal range of motion.     Cervical back: Normal range of motion and neck supple.     Right lower leg: Edema present.     Left lower leg: Edema present.  Skin:    General: Skin is warm.     Capillary Refill: Capillary refill takes less than 2 seconds.  Neurological:     General: No focal deficit present.     Mental Status: She is alert and oriented to person, place, and time.  Psychiatric:        Mood and Affect: Mood normal.        Behavior: Behavior normal.      ED Results / Procedures / Treatments   Labs (all labs ordered are listed, but only abnormal results are displayed) Labs Reviewed  COMPREHENSIVE METABOLIC PANEL - Abnormal; Notable for the following components:      Result Value   Glucose, Bld 104 (*)    BUN 40 (*)    Creatinine, Ser 1.44 (*)    Calcium 8.4 (*)    Total Protein 5.8 (*)    Albumin 3.1 (*)    AST 71 (*)    ALT 56 (*)    Alkaline Phosphatase 537 (*)    Total Bilirubin 2.3 (*)    GFR calc non Af Amer 34 (*)    GFR calc Af Amer 39 (*)    All other components within normal limits  CBC - Abnormal; Notable for the following components:   RBC 3.46 (*)    Hemoglobin 11.9 (*)    HCT 34.9 (*)    MCV 100.9 (*)    MCH 34.4 (*)    RDW 15.9 (*)    Platelets 113 (*)    All other components within normal limits  BRAIN NATRIURETIC PEPTIDE  TROPONIN I (HIGH SENSITIVITY)    EKG EKG Interpretation  Date/Time:  Saturday February 03 2020 16:05:54 EST Ventricular Rate:  97 PR Interval:  166 QRS Duration: 68 QT Interval:  380 QTC Calculation: 482 R Axis:   7 Text Interpretation: Normal sinus rhythm Nonspecific ST and T wave abnormality Abnormal ECG No significant change since last tracing Confirmed by Isla Pence [phone]) on 02/03/2020 4:12:15 PM   Radiology DG Chest 2 View  Result Date: 02/03/2020 CLINICAL DATA:  Shortness of breath EXAM: CHEST - 2 VIEW COMPARISON:  01/17/2020 FINDINGS: Large right pleural  effusion is new since the prior study. There is collapse of the right lower lobe and middle lobes. Left lung is clear. Cardiomediastinal contours are normal. IMPRESSION: New large right pleural effusion with collapse of the right middle and lower lobes. Electronically Signed   By: Ulyses Jarred M.D.   On: 02/03/2020 16:33   DG CHEST PORT 1 VIEW  Result Date: 02/03/2020 CLINICAL DATA:  Status post right thoracentesis. EXAM: PORTABLE CHEST 1 VIEW COMPARISON:  Radiograph earlier this day. FINDINGS: Decreased right  pleural effusion post thoracentesis, small to moderate volume of pleural fluid persists. Residual pleural fluid may be partially loculated and tracking laterally. Associated right basilar opacity. No definite pneumothorax. Left lung hypoaerated but clear. Heart size normal. No large left pleural effusion. IMPRESSION: 1. Decreased right pleural effusion post thoracentesis. No definite pneumothorax. 2. Associated right basilar opacity likely atelectasis. Electronically Signed   By: Keith Rake M.D.   On: 02/03/2020 19:37    Procedures Procedures (including critical care time)  Medications Ordered in ED Medications  fentaNYL (SUBLIMAZE) injection 25 mcg (has no administration in time range)    ED Course  I have reviewed the triage vital signs and the nursing notes.  Pertinent labs & imaging results that were available during my care of the patient were reviewed by me and considered in my medical decision making (see chart for details).    MDM Rules/Calculators/A&P                      I was able to get ahold of son.  He was under the impression that she was sent here just to get her pleural fluid drained.  He said the hospice nurse spoke to someone.  This information was not communicated to the ED.  I spoke with Dr. Annamaria Boots (IR) who can't do it tonight, but can do it tomorrow if she's admitted by the hospitalists.  Pt is very sob with movement, so I don't think she can wait to have it done as an outpatient.  I spoke with Dr. Marthenia Rolling who recommended that I call CCM.  I spoke with Dr. Wonda Amis (pulm) who saw pt.  Pt likely needs a pleurx catheter to be placed so hospice can drain the fluid as needed.  Pt wants to go home and have that done as an outpatient, so he did the bedside thoracentesis.  Pt tolerated the thoracentesis.  No ptx on CXR.  Pt's son interested in the pleurx catheter, so I put in a referral for IR to do this as an outpatient.   Final Clinical Impression(s) / ED  Diagnoses Final diagnoses:  Pleural effusion, malignant  Dyspnea on exertion  Hospice care patient    Rx / DC Orders ED Discharge Orders         Ordered    Ambulatory referral to Interventional Radiology    Comments: Pleurx catheter for recurrent right sided malignant effusion.   02/03/20 2012           Isla Pence, MD 02/03/20 2014

## 2020-02-06 LAB — FUNGUS CULTURE RESULT

## 2020-02-06 LAB — FUNGAL ORGANISM REFLEX

## 2020-02-06 LAB — FUNGUS CULTURE WITH STAIN

## 2020-02-07 ENCOUNTER — Telehealth: Payer: Self-pay | Admitting: *Deleted

## 2020-02-07 NOTE — Telephone Encounter (Signed)
PCCM: hopefully we can get a hold of her Thanks Garner Nash, DO [facility] 02/07/2020 5:46 PM

## 2020-02-07 NOTE — Telephone Encounter (Signed)
[facility] and they stated they did not have a patient by that name. I left a message on her sons cell phone number per DPR. Patient still needs covid test set up

## 2020-02-07 NOTE — Telephone Encounter (Signed)
Will route to Dr. Vaughan Browner and Dr. Valeta Harms as [facility]

## 2020-02-07 NOTE — Telephone Encounter (Signed)
Katie calling to speak w/ Lauren about this pt She can be reached @ [phone].Hillery Hunter

## 2020-02-07 NOTE — Telephone Encounter (Signed)
-----   Message from Garner Nash, DO sent at 02/07/2020  8:49 AM EST ----- Regarding: RE: Pleurex Yes, I am happy too. I saw her in the hospital on her initial consult. Sweet lady.   Plumer Mittelstaedt, this is still a work in progress for scheduling. But can you try to schedule the pleur-ex with me on Tuesday March 2nd? We will have to use [facility] department to schedule for the time being. We will need to arrange covid testing as well.   Thanks,  Brad      ----- Message ----- From: Candee Furbish, MD Sent: 02/07/2020   8:43 AM EST To: Amado Coe, RN, Garner Nash, DO, # Subject: FW: Pleurex                                    Can you guys help praveen get this set up? I'm off for next few days. 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 I spoke to you about her last weekend. She was hesitant then but now wants to go ahead with the procedure. Can you see how it can be scheduled. I would like to do it with one of yall  ----- Message ----- From: Marshell Garfinkel, MD Sent: 02/06/2020   1:34 PM EST To: [facility] Subject: Pleurex                                        Can you arrange pleurex procedure for this patient at next available? Thanks

## 2020-02-07 NOTE — Telephone Encounter (Signed)
Called and spoke with Katie at [facility]. Pt has been scheduled for the pleurX cath insertion for 02/13/20 at 11:30 at [facility] for Dr. Valeta Harms. ATC pt at number listed, however it was a busy signal. In the contacts tab it states pt lives at [facility]. Called them at [phone] and left a VM to see if pt is a resident. Will need to contact pt to be sure she is aware of the procedure and will need to get her COVID tested prior to procedure.    Will forward to Lauren to follow.

## 2020-02-08 ENCOUNTER — Telehealth: Payer: Self-pay | Admitting: Nurse Practitioner

## 2020-02-08 NOTE — Telephone Encounter (Signed)
Thanks for arranging it.  Son Alveda Reasons is the one I have been speaking too. He told me his wife is also helping arrange this. Can you call the daughter in law as well?

## 2020-02-08 NOTE — Telephone Encounter (Signed)
Erica Escobar is returning phone call.  Patient phone number is [phone].

## 2020-02-08 NOTE — Telephone Encounter (Signed)
[phone] is her number Vaughan Basta calling back

## 2020-02-08 NOTE — Telephone Encounter (Signed)
ATC the daughter in law, unable to reach. Left message to have call office to inform details of when procedure is, to be there 1.5 hours before procedure, and to schedule for covid.

## 2020-02-08 NOTE — Telephone Encounter (Signed)
Called and spoke to Paradise, pt's daughter-in-law. Informed her of the procedure date, time, arrival date, and the need for COVID test. COVID test has been scheduled for this weekend 2/27. Vaughan Basta verbalized understanding.   Will forward to Dr. Valeta Harms and Dr. Vaughan Browner as Juluis Rainier.

## 2020-02-08 NOTE — Telephone Encounter (Signed)
Scheduled apt per 2/23 sch message - unable to reach pt - left message with appt date and time

## 2020-02-09 ENCOUNTER — Emergency Department (HOSPITAL_COMMUNITY)

## 2020-02-09 ENCOUNTER — Other Ambulatory Visit: Payer: Self-pay

## 2020-02-09 ENCOUNTER — Inpatient Hospital Stay (HOSPITAL_COMMUNITY)
Admission: EM | Admit: 2020-02-09 | Discharge: 2020-02-11 | DRG: 433 | Disposition: A | Discharge status: Hospice | Attending: Internal Medicine | Admitting: Internal Medicine

## 2020-02-09 ENCOUNTER — Encounter (HOSPITAL_COMMUNITY): Payer: Self-pay | Admitting: Emergency Medicine

## 2020-02-09 DIAGNOSIS — M546 Pain in thoracic spine: Secondary | ICD-10-CM

## 2020-02-09 DIAGNOSIS — R0602 Shortness of breath: Secondary | ICD-10-CM | POA: Diagnosis not present

## 2020-02-09 DIAGNOSIS — Z87891 Personal history of nicotine dependence: Secondary | ICD-10-CM | POA: Diagnosis not present

## 2020-02-09 DIAGNOSIS — J910 Malignant pleural effusion: Secondary | ICD-10-CM | POA: Diagnosis present

## 2020-02-09 DIAGNOSIS — R0902 Hypoxemia: Secondary | ICD-10-CM | POA: Diagnosis not present

## 2020-02-09 DIAGNOSIS — Z9221 Personal history of antineoplastic chemotherapy: Secondary | ICD-10-CM

## 2020-02-09 DIAGNOSIS — G2581 Restless legs syndrome: Secondary | ICD-10-CM | POA: Diagnosis not present

## 2020-02-09 DIAGNOSIS — J90 Pleural effusion, not elsewhere classified: Secondary | ICD-10-CM | POA: Diagnosis not present

## 2020-02-09 DIAGNOSIS — I129 Hypertensive chronic kidney disease with stage 1 through stage 4 chronic kidney disease, or unspecified chronic kidney disease: Secondary | ICD-10-CM | POA: Diagnosis present

## 2020-02-09 DIAGNOSIS — N183 Chronic kidney disease, stage 3 unspecified: Secondary | ICD-10-CM | POA: Diagnosis not present

## 2020-02-09 DIAGNOSIS — Z20822 Contact with and (suspected) exposure to covid-19: Secondary | ICD-10-CM | POA: Diagnosis present

## 2020-02-09 DIAGNOSIS — H9190 Unspecified hearing loss, unspecified ear: Secondary | ICD-10-CM | POA: Diagnosis not present

## 2020-02-09 DIAGNOSIS — D61818 Other pancytopenia: Secondary | ICD-10-CM | POA: Diagnosis present

## 2020-02-09 DIAGNOSIS — Z515 Encounter for palliative care: Secondary | ICD-10-CM | POA: Diagnosis not present

## 2020-02-09 DIAGNOSIS — J9811 Atelectasis: Secondary | ICD-10-CM | POA: Diagnosis not present

## 2020-02-09 DIAGNOSIS — K7469 Other cirrhosis of liver: Secondary | ICD-10-CM | POA: Diagnosis not present

## 2020-02-09 DIAGNOSIS — K7460 Unspecified cirrhosis of liver: Secondary | ICD-10-CM | POA: Diagnosis not present

## 2020-02-09 DIAGNOSIS — Z66 Do not resuscitate: Secondary | ICD-10-CM | POA: Diagnosis not present

## 2020-02-09 DIAGNOSIS — Z85828 Personal history of other malignant neoplasm of skin: Secondary | ICD-10-CM | POA: Diagnosis not present

## 2020-02-09 DIAGNOSIS — C8580 Other specified types of non-Hodgkin lymphoma, unspecified site: Secondary | ICD-10-CM | POA: Diagnosis not present

## 2020-02-09 DIAGNOSIS — M5440 Lumbago with sciatica, unspecified side: Secondary | ICD-10-CM | POA: Diagnosis not present

## 2020-02-09 DIAGNOSIS — Z85038 Personal history of other malignant neoplasm of large intestine: Secondary | ICD-10-CM

## 2020-02-09 DIAGNOSIS — K7581 Nonalcoholic steatohepatitis (NASH): Secondary | ICD-10-CM | POA: Diagnosis present

## 2020-02-09 DIAGNOSIS — N1832 Chronic kidney disease, stage 3b: Secondary | ICD-10-CM | POA: Diagnosis not present

## 2020-02-09 DIAGNOSIS — Z8572 Personal history of non-Hodgkin lymphomas: Secondary | ICD-10-CM | POA: Diagnosis not present

## 2020-02-09 DIAGNOSIS — G8929 Other chronic pain: Secondary | ICD-10-CM | POA: Diagnosis not present

## 2020-02-09 DIAGNOSIS — J918 Pleural effusion in other conditions classified elsewhere: Secondary | ICD-10-CM | POA: Diagnosis not present

## 2020-02-09 DIAGNOSIS — I951 Orthostatic hypotension: Secondary | ICD-10-CM | POA: Diagnosis not present

## 2020-02-09 DIAGNOSIS — Z9689 Presence of other specified functional implants: Secondary | ICD-10-CM

## 2020-02-09 DIAGNOSIS — J449 Chronic obstructive pulmonary disease, unspecified: Secondary | ICD-10-CM | POA: Diagnosis present

## 2020-02-09 DIAGNOSIS — E1122 Type 2 diabetes mellitus with diabetic chronic kidney disease: Secondary | ICD-10-CM | POA: Diagnosis present

## 2020-02-09 DIAGNOSIS — Z90710 Acquired absence of both cervix and uterus: Secondary | ICD-10-CM

## 2020-02-09 DIAGNOSIS — E785 Hyperlipidemia, unspecified: Secondary | ICD-10-CM | POA: Diagnosis not present

## 2020-02-09 DIAGNOSIS — Z923 Personal history of irradiation: Secondary | ICD-10-CM | POA: Diagnosis not present

## 2020-02-09 DIAGNOSIS — M549 Dorsalgia, unspecified: Secondary | ICD-10-CM | POA: Diagnosis present

## 2020-02-09 DIAGNOSIS — K219 Gastro-esophageal reflux disease without esophagitis: Secondary | ICD-10-CM | POA: Diagnosis present

## 2020-02-09 LAB — CBC
HCT: 34.5 % — ABNORMAL LOW (ref 36.0–46.0)
Hemoglobin: 11.9 g/dL — ABNORMAL LOW (ref 12.0–15.0)
MCH: 35.2 pg — ABNORMAL HIGH (ref 26.0–34.0)
MCHC: 34.5 g/dL (ref 30.0–36.0)
MCV: 102.1 fL — ABNORMAL HIGH (ref 80.0–100.0)
Platelets: 138 10*3/uL — ABNORMAL LOW (ref 150–400)
RBC: 3.38 MIL/uL — ABNORMAL LOW (ref 3.87–5.11)
RDW: 15.7 % — ABNORMAL HIGH (ref 11.5–15.5)
WBC: 3.1 10*3/uL — ABNORMAL LOW (ref 4.0–10.5)
nRBC: 0 % (ref 0.0–0.2)

## 2020-02-09 LAB — COMPREHENSIVE METABOLIC PANEL
ALT: 52 U/L — ABNORMAL HIGH (ref 0–44)
AST: 69 U/L — ABNORMAL HIGH (ref 15–41)
Albumin: 2.8 g/dL — ABNORMAL LOW (ref 3.5–5.0)
Alkaline Phosphatase: 548 U/L — ABNORMAL HIGH (ref 38–126)
Anion gap: 8 (ref 5–15)
BUN: 32 mg/dL — ABNORMAL HIGH (ref 8–23)
CO2: 25 mmol/L (ref 22–32)
Calcium: 8.1 mg/dL — ABNORMAL LOW (ref 8.9–10.3)
Chloride: 105 mmol/L (ref 98–111)
Creatinine, Ser: 1.43 mg/dL — ABNORMAL HIGH (ref 0.44–1.00)
GFR calc Af Amer: 40 mL/min — ABNORMAL LOW (ref >60)
GFR calc non Af Amer: 34 mL/min — ABNORMAL LOW (ref >60)
Glucose, Bld: 95 mg/dL (ref 70–99)
Potassium: 3.2 mmol/L — ABNORMAL LOW (ref 3.5–5.1)
Sodium: 138 mmol/L (ref 135–145)
Total Bilirubin: 1.7 mg/dL — ABNORMAL HIGH (ref 0.3–1.2)
Total Protein: 5.5 g/dL — ABNORMAL LOW (ref 6.5–8.1)

## 2020-02-09 MED ORDER — LIDOCAINE 5 % EX PTCH
1.0000 | MEDICATED_PATCH | Freq: Every day | CUTANEOUS | Status: DC
  Administered 2020-02-09 – 2020-02-10 (×2): 1 via TRANSDERMAL
  Filled 2020-02-09 (×3): qty 1

## 2020-02-09 MED ORDER — ENSURE ENLIVE PO LIQD
237.0000 mL | Freq: Two times a day (BID) | ORAL | Status: DC
  Administered 2020-02-09 – 2020-02-10 (×2): 237 mL via ORAL

## 2020-02-09 MED ORDER — ONDANSETRON HCL 4 MG PO TABS: 4.0000 mg | ORAL_TABLET | Freq: Three times a day (TID) | ORAL | Status: DC | PRN

## 2020-02-09 MED ORDER — MIRTAZAPINE 15 MG PO TABS
7.5000 mg | ORAL_TABLET | Freq: Every day | ORAL | Status: DC
  Administered 2020-02-09 – 2020-02-10 (×2): 7.5 mg via ORAL
  Filled 2020-02-09 (×2): qty 1

## 2020-02-09 MED ORDER — SODIUM CHLORIDE 0.9% FLUSH
3.0000 mL | Freq: Once | INTRAVENOUS | Status: AC
  Administered 2020-02-09: 20:00:00 3 mL via INTRAVENOUS

## 2020-02-09 MED ORDER — FUROSEMIDE 40 MG PO TABS
40.0000 mg | ORAL_TABLET | Freq: Every day | ORAL | Status: DC
  Administered 2020-02-10 – 2020-02-11 (×2): 40 mg via ORAL
  Filled 2020-02-09 (×2): qty 1

## 2020-02-09 MED ORDER — ACETAMINOPHEN 325 MG PO TABS
325.0000 mg | ORAL_TABLET | Freq: Two times a day (BID) | ORAL | Status: DC
  Administered 2020-02-09 – 2020-02-11 (×4): 325 mg via ORAL
  Filled 2020-02-09 (×4): qty 1

## 2020-02-09 MED ORDER — LACTULOSE 10 GM/15ML PO SOLN: 20.0000 g | Freq: Every day | ORAL | Status: DC | PRN

## 2020-02-09 MED ORDER — MIDODRINE HCL 2.5 MG PO TABS
2.5000 mg | ORAL_TABLET | Freq: Two times a day (BID) | ORAL | Status: DC
  Administered 2020-02-10 – 2020-02-11 (×4): 2.5 mg via ORAL
  Filled 2020-02-09 (×4): qty 1

## 2020-02-09 MED ORDER — FLUDROCORTISONE ACETATE 0.1 MG PO TABS
200.0000 ug | ORAL_TABLET | Freq: Every day | ORAL | Status: DC
  Administered 2020-02-10 – 2020-02-11 (×2): 200 ug via ORAL
  Filled 2020-02-09 (×2): qty 2

## 2020-02-09 MED ORDER — POLYETHYLENE GLYCOL 3350 17 G PO PACK: 17.0000 g | PACK | Freq: Every day | ORAL | Status: DC | PRN

## 2020-02-09 MED ORDER — PANTOPRAZOLE SODIUM 40 MG PO TBEC
40.0000 mg | DELAYED_RELEASE_TABLET | Freq: Every day | ORAL | Status: DC
  Administered 2020-02-10 – 2020-02-11 (×2): 40 mg via ORAL
  Filled 2020-02-09 (×2): qty 1

## 2020-02-09 NOTE — H&P (Addendum)
History and Physical    Erica Escobar WLS:[id number] DOB: Feb 16, 1936 DOA: 02/09/2020  PCP: Deland Pretty, MD Patient coming from: Home with hospice  I have personally briefly reviewed patient's old medical records in [place]  Chief Complaint: worsening shortness of breath  HPI: Erica Escobar is a 84 y.o. 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  Patient is a poor historian due to her hearing impairment and difficulty understanding questions.  She says that she was sent here for acute worsening shortness of breath although she feels no worse than usual.  She has been home with hospice and received oxygen from them yesterday but did not need it until today.  Patient was recently seen on 02/03/2020 in the ED for malignant pleural effusion and had beside thoracentesis done by critical care. Outpatient referral was made to IR to get pleurx catheter but appointment is not until next week.  In the ED, She was afebrile, tachycardia and tachypneic requiring 3L. Pancytopenia with WBC of 3.1, hemoglobin of 11.9 and platelet of 138 K of 3.2, creatinine of 1.43 which is better than baseline AST of 69, ALT of 52, alkaline phosphatase of 548, Total bilirubin of 1.7  CXR shows large pleural effusion of the right hemithorax.  ED physician discussed case with critical care and they recommended her to be admitted so that she can get her thoracentesis and Pleurx catheter rather than wait during until her outpatient appointment with [facility] next week.   Review of Systems:  Constitutional: No Weight Change, No  Fever ENT/Mouth: No sore throat, No Rhinorrhea Eyes: No Eye Pain, No Vision Changes Cardiovascular: No Chest Pain, +sob Respiratory: No Cough, No Sputum, Gastrointestinal: No Nausea, No Vomiting, No Diarrhea, No Constipation, No Pain Genitourinary: no Urinary Incontinence,  Musculoskeletal: No Arthralgias, No Myalgias Skin: No Skin Lesions, No Pruritus, Neuro: no Weakness, No Numbness Psych: No Anxiety/Panic, No Depression,+ decrease appetite Heme/Lymph: No Bruising, No Bleeding  Past Medical History:  Diagnosis Date  . Abnormal tympanic membrane    right ear and mastoid problems  . Arthritis    oseoarthritis  . Cancer of ascending colon ([place]) 02/15/2015  . Constipation, chronic   . COPD (chronic obstructive pulmonary disease) (HCC)    mild COPD  . Depression   . Diverticulosis   . GERD (gastroesophageal reflux disease)   . Glaucoma    bilateral-sugery with laser, using eye drops  . Hemorrhoids    Internal  . History of hiatal hernia    dx. '87 Schatzki's ring  . Hyperlipidemia    good control  . Hypertension   . Insomnia    hx. of  . Keratosis, actinic   . Leg cramps   . Macular degeneration   . Neuromuscular disorder (HCC)    neuropathy legs,   . On aspirin at home    chronic use  . PONV (postoperative nausea and vomiting)   . Restless leg syndrome   . Schatzki's ring 1987  . Skin cancer    multiple, "tx. area right anterior wrist"  . Urinary incontinence    occ, stress    Past Surgical History:  Procedure Laterality Date  . ABDOMINAL HYSTERECTOMY    .  BLADDER REPAIR    . CARDIAC CATHETERIZATION  2007, 2009  . CATARACT EXTRACTION Bilateral   . CHOLECYSTECTOMY    . COLECTOMY    . FINGER NAIL SURGERY    . FOOT SURGERY Right   . HEMORROIDECTOMY    . INNER EAR SURGERY Right    3-4 times  . INTRAOPERATIVE ARTERIOGRAM     right arm  . IR THORACENTESIS ASP PLEURAL SPACE W/IMG GUIDE  01/05/2020  . KNEE SURGERY Right   . LEG SURGERY Bilateral    laser  ablation  . MASTOID DEBRIDEMENT     x2  . SKIN CANCER EXCISION     right/left thumb nail, left face, right ear/neck, right leg  . TOE FUSION    . TONSILLECTOMY       reports that she has quit smoking. She has never used smokeless tobacco. She reports that she does not drink alcohol or use drugs.  Allergies  Allergen Reactions  . Azithromycin Itching  . Allopurinol Other (See Comments)    Unknown  . Cephalexin Other (See Comments)    Unknown  . Codeine Itching  . Gabapentin Swelling and Other (See Comments)    Throat and leg swelling  . Niaspan [Niacin] Other (See Comments)    Unknown  . Oxybutynin Other (See Comments)    "made me go crazy"-- had crazy dreams.   . Paxil [Paroxetine Hcl] Other (See Comments)    Unknown reaction  . Penicillins Itching and Other (See Comments)    Did it involve swelling of the face/tongue/throat, SOB, or low BP? No Did it involve sudden or severe rash/hives, skin peeling, or any reaction on the inside of your mouth or nose? No Did you need to seek medical attention at a hospital or doctor's office? No When did it last happen?10 + years If all above answers are "NO", may proceed with cephalosporin use.    Marland Kitchen Pentazocine Itching, Nausea Only and Other (See Comments)    Headache.   . Tizanidine Other (See Comments)    "made me crazy"  . Tramadol Other (See Comments)    Head feels "swimmy" and weakness  . Vicodin [Hydrocodone-Acetaminophen] Other (See Comments)    "made me go crazy"    Family History  Problem Relation Age of Onset  . Diabetes Mother   . Cancer Father   . Cancer Brother   . Cancer Brother   . Clotting disorder Child   . Hypertension Other      Prior to Admission medications   Medication Sig Start Date End Date Taking? Authorizing Provider  acetaminophen (TYLENOL) 325 MG tablet Take 325 mg by mouth in the morning and at bedtime.    Yes [provider]  betamethasone valerate (VALISONE) 0.1 % cream Apply  1 application topically 2 (two) times daily as needed (rash under breasts.).    Yes [provider]  calcium-vitamin D (OSCAL WITH D) 250-125 MG-UNIT tablet Take 1 tablet by mouth daily.   Yes [provider]  ENSURE (ENSURE) Take 237 mLs by mouth in the morning and at bedtime. VANILLA OR STRAWBERRY IF AVAILABLE PO DAILY    Yes [provider]  fludrocortisone (FLORINEF) 0.1 MG tablet Take 200 mcg by mouth daily. 12/12/19  Yes [provider]  furosemide (LASIX) 40 MG tablet Take 1 tablet (40 mg total) by mouth daily. 01/07/20  Yes Alma Friendly, MD  lactulose (CHRONULAC) 10 GM/15ML solution Take 20 g by mouth daily as needed for mild constipation.  11/13/19  Yes [provider]  Lidocaine 4 % PTCH Place 1 patch onto the skin daily. Remove & Discard patch within 12 hours or as directed by MD   Yes [provider]  loratadine (CLARITIN) 10 MG tablet Take 10 mg by mouth daily as needed for allergies.   Yes [provider]  midodrine (PROAMATINE) 2.5 MG tablet Take 1 tablet (2.5 mg total) by mouth 2 (two) times daily with a meal. 10/19/19  Yes Guilford Shi, MD  mirtazapine (REMERON) 7.5 MG tablet Take 7.5 mg by mouth at bedtime.   Yes [provider]  Morphine Sulfate (MORPHINE CONCENTRATE) 10 mg / 0.5 ml concentrated solution Take 5 mg by mouth every 2 (two) hours as needed for shortness of breath.  01/23/20  Yes [provider]  Multiple Vitamin (MULTIVITAMIN WITH MINERALS) TABS tablet Take 1 tablet by mouth daily.   Yes [provider]  Multiple Vitamins-Minerals (OCUVITE PO) Take 1 tablet by mouth daily.   Yes [provider]  Omega-3 Fatty Acids (FISH OIL) 1000 MG CAPS Take 2,000 mg by mouth daily.    Yes [provider]  ondansetron (ZOFRAN) 4 MG tablet Take 1 tablet (4 mg total) by mouth every 8 (eight) hours as needed for nausea or vomiting. 08/24/19  Yes Ladell Pier, MD   pantoprazole (PROTONIX) 40 MG tablet Take 40 mg by mouth daily.   Yes [provider]  polyethylene glycol (MIRALAX / GLYCOLAX) 17 g packet Take 17 g by mouth daily as needed for moderate constipation.   Yes [provider]  acetaminophen (TYLENOL) 500 MG tablet Take 1 tablet (500 mg total) by mouth every 6 (six) hours as needed for mild pain or moderate pain. Patient not taking: Reported on 02/09/2020 01/19/20 01/18/21  Harold Hedge, MD  potassium chloride SA (KLOR-CON) 10 MEQ tablet Take 1 tablet (10 mEq total) by mouth daily for 14 days. Patient not taking: Reported on 02/08/2020 01/19/20 02/08/20  Harold Hedge, MD    Physical Exam: Vitals:   02/09/20 1903 02/09/20 1904 02/09/20 1915 02/09/20 1930  BP:   (!) 142/72 (!) 118/99  Pulse:  99    Resp:  (!) 22  (!) 23  Temp:  98.3 F (36.8 C)    TempSrc:  Oral    SpO2:  91%    Weight: 59 kg     Height: 5' 3"  (1.6 m)       Constitutional: Thin frail, chronically ill-appearing female laying flat in bed on her right side Vitals:   02/09/20 1903 02/09/20 1904 02/09/20 1915 02/09/20 1930  BP:   (!) 142/72 (!) 118/99  Pulse:  99    Resp:  (!) 22  (!) 23  Temp:  98.3 F (36.8 C)    TempSrc:  Oral    SpO2:  91%    Weight: 59 kg     Height: 5' 3"  (1.6 m)      Eyes: PERRL, lids and conjunctivae normal ENMT: Mucous membranes are moist.  Neck: normal, supple Respiratory: Diminished right upper and lower lobe lung sound and no wheezing or crackles on 4 L via nasal cannula.  Patient is tachypneic when speaking. Cardiovascular: Regular rate and rhythm, no murmurs / rubs / gallops. No extremity edema. 2+ pedal pulses.   Abdomen: no tenderness but moderately distended. Bowel sounds positive.  Musculoskeletal: no clubbing / cyanosis. No joint deformity upper and lower extremities. Good ROM, no contractures. Normal muscle tone.  Skin:  no rashes, lesions, ulcers. No induration Neurologic: CN 2-12 grossly intact. Sensation intact.  Strength 5/5 in all 4.  Psychiatric: Normal judgment and insight. Alert and oriented x 3.  Depressed mood.    Labs on Admission: I have personally reviewed following labs and imaging studies  CBC: Recent Labs  Lab 02/03/20 1716 02/09/20 1941  WBC 4.1 3.1*  HGB 11.9* 11.9*  HCT 34.9* 34.5*  MCV 100.9* 102.1*  PLT 113* 073*   Basic Metabolic Panel: Recent Labs  Lab 02/03/20 1553 02/09/20 1941  NA 142 138  K 3.5 3.2*  CL 108 105  CO2 25 25  GLUCOSE 104* 95  BUN 40* 32*  CREATININE 1.44* 1.43*  CALCIUM 8.4* 8.1*   GFR: Estimated Creatinine Clearance: 25.5 mL/min (A) (by C-G formula based on SCr of 1.43 mg/dL (H)). Liver Function Tests: Recent Labs  Lab 02/03/20 1553 02/09/20 1941  AST 71* 69*  ALT 56* 52*  ALKPHOS 537* 548*  BILITOT 2.3* 1.7*  PROT 5.8* 5.5*  ALBUMIN 3.1* 2.8*   No results for input(s): LIPASE, AMYLASE in the last 168 hours. No results for input(s): AMMONIA in the last 168 hours. Coagulation Profile: No results for input(s): INR, PROTIME in the last 168 hours. Cardiac Enzymes: No results for input(s): CKTOTAL, CKMB, CKMBINDEX, TROPONINI in the last 168 hours. BNP (last 3 results) No results for input(s): PROBNP in the last 8760 hours. HbA1C: No results for input(s): HGBA1C in the last 72 hours. CBG: No results for input(s): GLUCAP in the last 168 hours. Lipid Profile: No results for input(s): CHOL, HDL, LDLCALC, TRIG, CHOLHDL, LDLDIRECT in the last 72 hours. Thyroid Function Tests: No results for input(s): TSH, T4TOTAL, FREET4, T3FREE, THYROIDAB in the last 72 hours. Anemia Panel: No results for input(s): VITAMINB12, FOLATE, FERRITIN, TIBC, IRON, RETICCTPCT in the last 72 hours. Urine analysis:    Component Value Date/Time   COLORURINE YELLOW 01/01/2020 [place] 01/01/2020 1313   LABSPEC 1.008 01/01/2020 1313   PHURINE 5.0 01/01/2020 [place] 01/01/2020 1313   HGBUR NEGATIVE 01/01/2020 1313    BILIRUBINUR NEGATIVE 01/01/2020 1313   KETONESUR NEGATIVE 01/01/2020 1313   PROTEINUR NEGATIVE 01/01/2020 1313   UROBILINOGEN 0.2 03/10/2009 1608   NITRITE NEGATIVE 01/01/2020 1313   LEUKOCYTESUR NEGATIVE 01/01/2020 1313    Radiological Exams on Admission: DG Chest Portable 1 View  Result Date: 02/09/2020 CLINICAL DATA:  Shortness of breath and effusion EXAM: PORTABLE CHEST 1 VIEW COMPARISON:  February 03, 2020 FINDINGS: The heart size and mediastinal contours are partially obscured. There is near complete opacification of the right hemithorax with a small pocket of air seen within the right apex. The left lung is clear. No shift of the mediastinal structures. No acute osseous abnormality. IMPRESSION: Near complete opacification of the right hemithorax which is likely due to large pleural effusion and atelectasis. Electronically Signed   By: Prudencio Pair M.D.   On: 02/09/2020 19:29    EKG: Independently reviewed.   Assessment/Plan  Recurrent right-sided pleural effusion/hepatic hydrothorax in the setting of NASH Cirrhosis will arrange for therapuetic thoracentesis with no fluid studies [facility] needs to be consulted in the morning for Pleurx catheter placement Patient to be discharged back to hospice at home  Pancytopenia Likely secondary to cirrhosis  Chronic postural hypotension continue fludrocortisone and midodrine  History of non-Hodgkin's lymphoma In remission  History of ascending colon s/p right colectomy Asymptomatic  Chronic kidney disease stage IIIb Creatinine stable Avoid nephrotoxic agent  Chronic back pain Continue lidocaine patches  DVT prophylaxis:SCD Code Status: Full Family Communication: Plan discussed with patient at bedside and with son over the phone disposition Plan: Home with hospice after observation Consults called:  Admission status: Observation   Yaneliz Radebaugh T Axelle Szwed DO [facility]   If 7PM-7AM, please contact  night-coverage [email or web address]   02/09/2020, 10:15 PM

## 2020-02-09 NOTE — ED Provider Notes (Signed)
[place] DEPT Provider Note   CSN: [id number] Arrival date & time: 02/09/20  1855     History Chief Complaint  Patient presents with  . Shortness of Breath  . Chest Pain    Erica Escobar is a 84 y.o. female.  HPI Patient presents with shortness of breath.  Has cirrhosis lymphoma and recurrent pleural effusions.  Is currently on hospice and is due to get Pleurx catheter placed 5 days from now.  However has had worsening shortness of breath.  This occultly doing anything.  Hypoxic on arrival requiring oxygen.  States she is feeling nervous doing anything now.  She is on hospice [place].  No fevers.  Does have swelling in her legs.  She is not on oxygen at home.    Past Medical History:  Diagnosis Date  . Abnormal tympanic membrane    right ear and mastoid problems  . Arthritis    oseoarthritis  . Cancer of ascending colon ([place]) 02/15/2015  . Constipation, chronic   . COPD (chronic obstructive pulmonary disease) (HCC)    mild COPD  . Depression   . Diverticulosis   . GERD (gastroesophageal reflux disease)   . Glaucoma    bilateral-sugery with laser, using eye drops  . Hemorrhoids    Internal  . History of hiatal hernia    dx. '87 Schatzki's ring  . Hyperlipidemia    good control  . Hypertension   . Insomnia    hx. of  . Keratosis, actinic   . Leg cramps   . Macular degeneration   . Neuromuscular disorder (HCC)    neuropathy legs,   . On aspirin at home    chronic use  . PONV (postoperative nausea and vomiting)   . Restless leg syndrome   . Schatzki's ring 1987  . Skin cancer    multiple, "tx. area right anterior wrist"  . Urinary incontinence    occ, stress    Patient Active Problem List   Diagnosis Date Noted  . Malignant pleural effusion 02/09/2020  . Hyperkalemia 01/17/2020  . Anasarca 01/01/2020  . DNR (do not resuscitate) 01/01/2020  . Recurrent right pleural effusion   . Hepatic cirrhosis ([place])   . Acute  respiratory failure with hypoxia ([place]) 10/12/2019  . Pleural effusion on right 10/12/2019  . Cirrhosis of liver with ascites ([place]) 10/12/2019  . Pancytopenia ([place]) 10/12/2019  . Malnutrition of moderate degree 08/30/2018  . Weakness 08/30/2018  . Symptomatic anemia 08/29/2018  . Acute kidney injury ([place]) 08/14/2018  . Dehydration 08/14/2018  . FTT (failure to thrive) in adult 08/14/2018  . Pressure injury of skin 08/14/2018  . Acute back pain 07/23/2018  . CKD (chronic kidney disease), stage III 07/23/2018  . Anorexia 07/23/2018  . Hyperlipidemia 07/23/2018  . Large cell lymphoma ([place]) 07/20/2018  . Metastatic cancer ([place]) 07/12/2018  . Chronic back pain 07/12/2018  . Pathologic rib fracture, initial encounter 07/12/2018  . Chest wall mass 07/12/2018  . Cancer of ascending colon s/p robotic colectomy 02/15/2015 02/15/2015  . Restless leg syndrome   . Depression   . Insomnia   . COPD (chronic obstructive pulmonary disease) ([place])   . GERD (gastroesophageal reflux disease)     Past Surgical History:  Procedure Laterality Date  . ABDOMINAL HYSTERECTOMY    . BLADDER REPAIR    . CARDIAC CATHETERIZATION  2007, 2009  . CATARACT EXTRACTION Bilateral   . CHOLECYSTECTOMY    . COLECTOMY    . FINGER NAIL  SURGERY    . FOOT SURGERY Right   . HEMORROIDECTOMY    . INNER EAR SURGERY Right    3-4 times  . INTRAOPERATIVE ARTERIOGRAM     right arm  . IR THORACENTESIS ASP PLEURAL SPACE W/IMG GUIDE  01/05/2020  . KNEE SURGERY Right   . LEG SURGERY Bilateral    laser ablation  . MASTOID DEBRIDEMENT     x2  . SKIN CANCER EXCISION     right/left thumb nail, left face, right ear/neck, right leg  . TOE FUSION    . TONSILLECTOMY       OB History   No obstetric history on file.     Family History  Problem Relation Age of Onset  . Diabetes Mother   . Cancer Father   . Cancer Brother   . Cancer Brother   . Clotting disorder Child   . Hypertension Other     Social History    Tobacco Use  . Smoking status: Former Research scientist (life sciences)  . Smokeless tobacco: Never Used  . Tobacco comment: short time x1 - during pregnancy  Substance Use Topics  . Alcohol use: No    Alcohol/week: 0.0 standard drinks  . Drug use: No    Home Medications Prior to Admission medications   Medication Sig Start Date End Date Taking? Authorizing Provider  acetaminophen (TYLENOL) 325 MG tablet Take 325 mg by mouth in the morning and at bedtime.    Yes [provider]  betamethasone valerate (VALISONE) 0.1 % cream Apply 1 application topically 2 (two) times daily as needed (rash under breasts.).    Yes [provider]  calcium-vitamin D (OSCAL WITH D) 250-125 MG-UNIT tablet Take 1 tablet by mouth daily.   Yes [provider]  ENSURE (ENSURE) Take 237 mLs by mouth in the morning and at bedtime. VANILLA OR STRAWBERRY IF AVAILABLE PO DAILY    Yes [provider]  fludrocortisone (FLORINEF) 0.1 MG tablet Take 200 mcg by mouth daily. 12/12/19  Yes [provider]  furosemide (LASIX) 40 MG tablet Take 1 tablet (40 mg total) by mouth daily. 01/07/20  Yes Alma Friendly, MD  lactulose (CHRONULAC) 10 GM/15ML solution Take 20 g by mouth daily as needed for mild constipation.  11/13/19  Yes [provider]  Lidocaine 4 % PTCH Place 1 patch onto the skin daily. Remove & Discard patch within 12 hours or as directed by MD   Yes [provider]  loratadine (CLARITIN) 10 MG tablet Take 10 mg by mouth daily as needed for allergies.   Yes [provider]  midodrine (PROAMATINE) 2.5 MG tablet Take 1 tablet (2.5 mg total) by mouth 2 (two) times daily with a meal. 10/19/19  Yes Guilford Shi, MD  mirtazapine (REMERON) 7.5 MG tablet Take 7.5 mg by mouth at bedtime.   Yes [provider]  Morphine Sulfate (MORPHINE CONCENTRATE) 10 mg / 0.5 ml concentrated solution Take 5 mg by mouth every 2 (two) hours as needed for shortness of breath.   01/23/20  Yes [provider]  Multiple Vitamin (MULTIVITAMIN WITH MINERALS) TABS tablet Take 1 tablet by mouth daily.   Yes [provider]  Multiple Vitamins-Minerals (OCUVITE PO) Take 1 tablet by mouth daily.   Yes [provider]  Omega-3 Fatty Acids (FISH OIL) 1000 MG CAPS Take 2,000 mg by mouth daily.    Yes [provider]  ondansetron (ZOFRAN) 4 MG tablet Take 1 tablet (4 mg total) by mouth every  8 (eight) hours as needed for nausea or vomiting. 08/24/19  Yes Ladell Pier, MD  pantoprazole (PROTONIX) 40 MG tablet Take 40 mg by mouth daily.   Yes [provider]  polyethylene glycol (MIRALAX / GLYCOLAX) 17 g packet Take 17 g by mouth daily as needed for moderate constipation.   Yes [provider]  acetaminophen (TYLENOL) 500 MG tablet Take 1 tablet (500 mg total) by mouth every 6 (six) hours as needed for mild pain or moderate pain. Patient not taking: Reported on 02/09/2020 01/19/20 01/18/21  Harold Hedge, MD  potassium chloride SA (KLOR-CON) 10 MEQ tablet Take 1 tablet (10 mEq total) by mouth daily for 14 days. Patient not taking: Reported on 02/08/2020 01/19/20 02/08/20  Harold Hedge, MD    Allergies    Azithromycin, Allopurinol, Cephalexin, Codeine, Gabapentin, Niaspan [niacin], Oxybutynin, Paxil [paroxetine hcl], Penicillins, Pentazocine, Tizanidine, Tramadol, and Vicodin [hydrocodone-acetaminophen]  Review of Systems   Review of Systems  Constitutional: Positive for fatigue. Negative for fever.  Respiratory: Positive for shortness of breath.   Cardiovascular: Positive for leg swelling. Negative for chest pain.  Gastrointestinal: Negative for abdominal pain.  Genitourinary: Negative for flank pain.  Musculoskeletal: Negative for back pain.  Neurological: Negative for weakness.  Psychiatric/Behavioral: Negative for confusion.    Physical Exam Updated Vital Signs BP 130/77 (BP Location: Left Arm)   Pulse 94   Temp 98.2 F  (36.8 C) (Oral)   Resp 20   Ht 5' 3"  (1.6 m)   Wt 59 kg   SpO2 96%   BMI 23.03 kg/m   Physical Exam Vitals reviewed.  HENT:     Head: Normocephalic.  Cardiovascular:     Rate and Rhythm: Regular rhythm.  Pulmonary:     Comments: Decreased breath sounds on right side. Chest:     Chest wall: No tenderness.  Abdominal:     Comments:  mild upper abdominal tenderness.  Musculoskeletal:     Right lower leg: Edema present.     Left lower leg: Edema present.     Comments: Pitting edema bilateral lower extremities.  Patient states this is actually not too bad for her however.  Neurological:     Mental Status: She is alert.     Comments: Appears somewhat anxious and mildly difficult to get history from.     ED Results / Procedures / Treatments   Labs (all labs ordered are listed, but only abnormal results are displayed) Labs Reviewed  COMPREHENSIVE METABOLIC PANEL - Abnormal; Notable for the following components:      Result Value   Potassium 3.2 (*)    BUN 32 (*)    Creatinine, Ser 1.43 (*)    Calcium 8.1 (*)    Total Protein 5.5 (*)    Albumin 2.8 (*)    AST 69 (*)    ALT 52 (*)    Alkaline Phosphatase 548 (*)    Total Bilirubin 1.7 (*)    GFR calc non Af Amer 34 (*)    GFR calc Af Amer 40 (*)    All other components within normal limits  CBC - Abnormal; Notable for the following components:   WBC 3.1 (*)    RBC 3.38 (*)    Hemoglobin 11.9 (*)    HCT 34.5 (*)    MCV 102.1 (*)    MCH 35.2 (*)    RDW 15.7 (*)    Platelets 138 (*)    All other components within normal limits  SARS CORONAVIRUS 2 (TAT 6-24 HRS)    EKG None  Radiology DG Chest Portable 1 View  Result Date: 02/09/2020 CLINICAL DATA:  Shortness of breath and effusion EXAM: PORTABLE CHEST 1 VIEW COMPARISON:  February 03, 2020 FINDINGS: The heart size and mediastinal contours are partially obscured. There is near complete opacification of the right hemithorax with a small pocket of air seen within  the right apex. The left lung is clear. No shift of the mediastinal structures. No acute osseous abnormality. IMPRESSION: Near complete opacification of the right hemithorax which is likely due to large pleural effusion and atelectasis. Electronically Signed   By: Prudencio Pair M.D.   On: 02/09/2020 19:29    Procedures Procedures (including critical care time)  Medications Ordered in ED Medications  acetaminophen (TYLENOL) tablet 325 mg (has no administration in time range)  furosemide (LASIX) tablet 40 mg (has no administration in time range)  midodrine (PROAMATINE) tablet 2.5 mg (has no administration in time range)  mirtazapine (REMERON) tablet 7.5 mg (has no administration in time range)  fludrocortisone (FLORINEF) tablet 200 mcg (has no administration in time range)  lactulose (CHRONULAC) 10 GM/15ML solution 20 g (has no administration in time range)  ondansetron (ZOFRAN) tablet 4 mg (has no administration in time range)  pantoprazole (PROTONIX) EC tablet 40 mg (has no administration in time range)  polyethylene glycol (MIRALAX / GLYCOLAX) packet 17 g (has no administration in time range)  feeding supplement (ENSURE ENLIVE) (ENSURE ENLIVE) liquid 237 mL (has no administration in time range)  lidocaine (LIDODERM) 5 % 1 patch (has no administration in time range)  sodium chloride flush (NS) 0.9 % injection 3 mL (3 mLs Intravenous Given 02/09/20 1942)    ED Course  I have reviewed the triage vital signs and the nursing notes.  Pertinent labs & imaging results that were available during my care of the patient were reviewed by me and considered in my medical decision making (see chart for details).    MDM Rules/Calculators/A&P                      Patient with shortness of breath.  Known pleural effusions due to get a Pleurx catheter on Tuesday, however became more short of breath with right chest almost totally opacified.  Discussed with pulmonology and will get either drain or  hopefully Pleurx catheter this weekend.  Also discussed with hospice since patient is a hospice patient.  If has catheter can be drained at home.  Will admit to hospitalist  Final Clinical Impression(s) / ED Diagnoses Final diagnoses:  Pleural effusion on right  Hospice care    Rx / DC Orders ED Discharge Orders    None       Davonna Belling, MD 02/09/20 2315

## 2020-02-09 NOTE — ED Notes (Addendum)
Completed covid test 24 hr and walked down  V/s updated

## 2020-02-09 NOTE — ED Notes (Signed)
Patients brief changed and patient cleaned up.

## 2020-02-09 NOTE — ED Notes (Signed)
X-ray at bedside

## 2020-02-09 NOTE — ED Notes (Signed)
Patient verbalized that if we are not going to do anything for her that she can just go home and be uncomfortable in her bed. This nurse explained that we are working with a provider to create the best plan of care for her.

## 2020-02-09 NOTE — ED Triage Notes (Signed)
Patient is complaining of sob. Patient was sent to the ED by her pcp

## 2020-02-09 NOTE — Telephone Encounter (Signed)
Son called back to follow up on missed call yesterday. Called back and left VM with appointment for 02/14/20 at 10:15 pm with arrival at 10:00 to check in.

## 2020-02-10 ENCOUNTER — Other Ambulatory Visit (HOSPITAL_COMMUNITY): Payer: Medicare Other

## 2020-02-10 ENCOUNTER — Observation Stay (HOSPITAL_COMMUNITY)

## 2020-02-10 DIAGNOSIS — C8580 Other specified types of non-Hodgkin lymphoma, unspecified site: Secondary | ICD-10-CM | POA: Diagnosis not present

## 2020-02-10 DIAGNOSIS — D61818 Other pancytopenia: Secondary | ICD-10-CM | POA: Diagnosis present

## 2020-02-10 DIAGNOSIS — M5440 Lumbago with sciatica, unspecified side: Secondary | ICD-10-CM | POA: Diagnosis not present

## 2020-02-10 DIAGNOSIS — Z87891 Personal history of nicotine dependence: Secondary | ICD-10-CM | POA: Diagnosis not present

## 2020-02-10 DIAGNOSIS — R918 Other nonspecific abnormal finding of lung field: Secondary | ICD-10-CM | POA: Diagnosis not present

## 2020-02-10 DIAGNOSIS — Z9689 Presence of other specified functional implants: Secondary | ICD-10-CM | POA: Diagnosis not present

## 2020-02-10 DIAGNOSIS — I129 Hypertensive chronic kidney disease with stage 1 through stage 4 chronic kidney disease, or unspecified chronic kidney disease: Secondary | ICD-10-CM | POA: Diagnosis present

## 2020-02-10 DIAGNOSIS — N183 Chronic kidney disease, stage 3 unspecified: Secondary | ICD-10-CM

## 2020-02-10 DIAGNOSIS — N1832 Chronic kidney disease, stage 3b: Secondary | ICD-10-CM | POA: Diagnosis present

## 2020-02-10 DIAGNOSIS — E1122 Type 2 diabetes mellitus with diabetic chronic kidney disease: Secondary | ICD-10-CM | POA: Diagnosis present

## 2020-02-10 DIAGNOSIS — Z85038 Personal history of other malignant neoplasm of large intestine: Secondary | ICD-10-CM | POA: Diagnosis not present

## 2020-02-10 DIAGNOSIS — K7581 Nonalcoholic steatohepatitis (NASH): Secondary | ICD-10-CM | POA: Diagnosis present

## 2020-02-10 DIAGNOSIS — Z8572 Personal history of non-Hodgkin lymphomas: Secondary | ICD-10-CM | POA: Diagnosis not present

## 2020-02-10 DIAGNOSIS — J9811 Atelectasis: Secondary | ICD-10-CM | POA: Diagnosis not present

## 2020-02-10 DIAGNOSIS — Z66 Do not resuscitate: Secondary | ICD-10-CM | POA: Diagnosis not present

## 2020-02-10 DIAGNOSIS — G8929 Other chronic pain: Secondary | ICD-10-CM | POA: Diagnosis present

## 2020-02-10 DIAGNOSIS — J90 Pleural effusion, not elsewhere classified: Secondary | ICD-10-CM | POA: Diagnosis not present

## 2020-02-10 DIAGNOSIS — R0902 Hypoxemia: Secondary | ICD-10-CM | POA: Diagnosis present

## 2020-02-10 DIAGNOSIS — K7460 Unspecified cirrhosis of liver: Secondary | ICD-10-CM | POA: Diagnosis present

## 2020-02-10 DIAGNOSIS — Z9221 Personal history of antineoplastic chemotherapy: Secondary | ICD-10-CM | POA: Diagnosis not present

## 2020-02-10 DIAGNOSIS — Z85828 Personal history of other malignant neoplasm of skin: Secondary | ICD-10-CM | POA: Diagnosis not present

## 2020-02-10 DIAGNOSIS — Z515 Encounter for palliative care: Secondary | ICD-10-CM | POA: Diagnosis present

## 2020-02-10 DIAGNOSIS — I951 Orthostatic hypotension: Secondary | ICD-10-CM | POA: Diagnosis present

## 2020-02-10 DIAGNOSIS — H9190 Unspecified hearing loss, unspecified ear: Secondary | ICD-10-CM | POA: Diagnosis present

## 2020-02-10 DIAGNOSIS — Z923 Personal history of irradiation: Secondary | ICD-10-CM | POA: Diagnosis not present

## 2020-02-10 DIAGNOSIS — G2581 Restless legs syndrome: Secondary | ICD-10-CM | POA: Diagnosis present

## 2020-02-10 DIAGNOSIS — Z90710 Acquired absence of both cervix and uterus: Secondary | ICD-10-CM | POA: Diagnosis not present

## 2020-02-10 DIAGNOSIS — J449 Chronic obstructive pulmonary disease, unspecified: Secondary | ICD-10-CM | POA: Diagnosis not present

## 2020-02-10 DIAGNOSIS — Z20822 Contact with and (suspected) exposure to covid-19: Secondary | ICD-10-CM | POA: Diagnosis present

## 2020-02-10 DIAGNOSIS — E785 Hyperlipidemia, unspecified: Secondary | ICD-10-CM | POA: Diagnosis present

## 2020-02-10 DIAGNOSIS — J918 Pleural effusion in other conditions classified elsewhere: Secondary | ICD-10-CM | POA: Diagnosis present

## 2020-02-10 LAB — SARS CORONAVIRUS 2 (TAT 6-24 HRS): SARS Coronavirus 2: NEGATIVE

## 2020-02-10 MED ORDER — MORPHINE SULFATE (CONCENTRATE) 10 MG/0.5ML PO SOLN
5.0000 mg | ORAL | Status: DC | PRN
  Administered 2020-02-10 – 2020-02-11 (×2): 5 mg via ORAL
  Filled 2020-02-10 (×2): qty 0.5

## 2020-02-10 MED ORDER — LIDOCAINE HCL 1 % IJ SOLN
INTRAMUSCULAR | Status: AC
  Filled 2020-02-10: qty 20

## 2020-02-10 NOTE — Progress Notes (Addendum)
[facility]                                                                              Patient Demographics  Erica Escobar, is a 84 y.o. female, DOB - [birth year], PRX:[id number]  Admit date - 02/09/2020   Admitting Physician Orene Desanctis, DO  Outpatient Primary MD for the patient is Deland Pretty, MD  Outpatient specialists:   LOS - 0  days   Medical records reviewed and are as summarized below:    Chief Complaint  Patient presents with  . Shortness of Breath  . Chest Pain       Brief summary   Patient is a 84 year old female with history of non-Hodgkin lymphoma status post radiation, chemo in remission, adenocarcinoma of ascending colon status post right colectomy 2016, history of multiple skin cancers, chronic anemia, CKD 3, COPD, Karlene Lineman cirrhosis with recurrent right sided pleural effusion/hepatic hydrothorax status post thoracentesis on 1/18, 1/22, 02/03/2020, presented with acute worsening shortness of breath.  She has been home with hospice. Patient was recently seen on 02/03/2020 in ED for malignant pleural effusion and had a bedside thoracentesis done by critical care.  Outpatient referral was made to IR to get Pleurx catheter, scheduled next week In ED, afebrile, tachycardia, tachypnea requiring 3 L O2, chest x-ray showed large pleural effusion of the right hemithorax. Patient was admitted for thoracentesis and Pleurx catheter.  IR consulted  Assessment & Plan    Recurrent right-sided pleural effusion/hepatic hydrothorax in the setting of [place] cirrhosis - Recurrent pleural effusion requiring thoracentesis - Placed consult to IR for thoracentesis and Pleurx catheter drain placement - will keep n.p.o. if procedure can be done today  Addendum Ultrasound-guided right thoracentesis done yielding 1.2 L fluid Per IR, tunneled pleural catheter will be done on Monday, 02/12/2020  Active Problems:   Large cell lymphoma ([place]), non-Hodgkin's lymphoma -Currently  in remission  Pancytopenia Likely secondary to cirrhosis, lymphoma  Chronic postural hypotension Continue midodrine, fludrocortisone  History of adenocarcinoma ascending colon status post right colectomy -Currently no acute issues  CKD stage IIIb -Creatinine currently stable, close to baseline, 1.4-1.8  Chronic back pain Continue lidocaine patches  Code Status: DNR DVT Prophylaxis: SCDs Family Communication: Discussed all imaging results, lab results, explained to the patient and son over the phone    Disposition Plan: Patient from home with hospice.  Anticipated discharge to home with hospice after thoracentesis and Pleurx catheter drainage placement, likely tomorrow.  Currently awaiting procedure. Pleurx catheter to be placed on 3/1.   Time Spent in minutes 35 minutes  Procedures:  *None  Consultants:   Interventional radiology  Antimicrobials:   Anti-infectives (From admission, onward)   None          Medications  Scheduled Meds: . acetaminophen  325 mg Oral BID  . feeding supplement (ENSURE ENLIVE)  237 mL Oral BID  . fludrocortisone  200 mcg Oral Daily  . furosemide  40 mg Oral Daily  . lidocaine  1 patch Transdermal QHS  . midodrine  2.5 mg Oral BID WC  . mirtazapine  7.5 mg Oral QHS  . pantoprazole  40 mg  Oral Daily   Continuous Infusions: PRN Meds:.lactulose, ondansetron, polyethylene glycol      Subjective:   Erica Escobar was seen and examined today.  Not feeling well, chronically ill, short of breath.  Patient denies dizziness, chest pain, N/V/D/C, new weakness, numbess, tingling. No acute events overnight.    Objective:   Vitals:   02/09/20 2215 02/09/20 2216 02/09/20 2259 02/10/20 0509  BP: 123/72 123/72 130/77 (!) 121/59  Pulse: 81 81 94 76  Resp: 17 17 20 18   Temp: 98.3 F (36.8 C)  98.2 F (36.8 C) 98 F (36.7 C)  TempSrc: Oral  Oral Oral  SpO2: 95% 100% 96% 99%  Weight:      Height:        Intake/Output Summary (Last  24 hours) at 02/10/2020 1123 Last data filed at 02/10/2020 1030 Gross per 24 hour  Intake 3 ml  Output --  Net 3 ml     Wt Readings from Last 3 Encounters:  02/09/20 59 kg  01/18/20 66.5 kg  01/06/20 62.2 kg     Exam  General: Alert and oriented x 3, NAD, feeling uncomfortable, hard of hearing  Cardiovascular: S1 S2 auscultated, no murmurs, RRR  Respiratory: Diminished right upper and lower lobe BS, no wheezing.  Gastrointestinal: Soft, nontender, moderately distended, + bowel sounds  Ext: 1+ pedal edema bilaterally, chronic  Neuro: No new deficits, moving all 4 extremities  Musculoskeletal: No digital cyanosis, clubbing  Skin: No rashes  Psych: Somewhat anxious,  alert and oriented x3    Data Reviewed:  I have personally reviewed following labs and imaging studies  Micro Results Recent Results (from the past 240 hour(s))  SARS CORONAVIRUS 2 (TAT 6-24 HRS) Nasopharyngeal Nasopharyngeal Swab     Status: None   Collection Time: 02/09/20  9:03 PM   Specimen: Nasopharyngeal Swab  Result Value Ref Range Status   SARS Coronavirus 2 NEGATIVE NEGATIVE Final    Comment: (NOTE) SARS-CoV-2 target nucleic acids are NOT DETECTED. The SARS-CoV-2 RNA is generally detectable in upper and lower respiratory specimens during the acute phase of infection. Negative results do not preclude SARS-CoV-2 infection, do not rule out co-infections with other pathogens, and should not be used as the sole basis for treatment or other patient management decisions. Negative results must be combined with clinical observations, patient history, and epidemiological information. The expected result is Negative. Fact Sheet for Patients: SugarRoll.be Fact Sheet for Healthcare Providers: https://www.woods-mathews.com/ This test is not yet approved or cleared by the [place] FDA and  has been authorized for detection and/or diagnosis of SARS-CoV-2  by FDA under an Emergency Use Authorization (EUA). This EUA will remain  in effect (meaning this test can be used) for the duration of the COVID-19 declaration under Section 56 4(b)(1) of the Act, 21 U.S.C. section 360bbb-3(b)(1), unless the authorization is terminated or revoked sooner. Performed at [facility], [place] [address] [place],  [address]     Radiology Reports DG Chest 2 View  Result Date: 02/03/2020 CLINICAL DATA:  Shortness of breath EXAM: CHEST - 2 VIEW COMPARISON:  01/17/2020 FINDINGS: Large right pleural effusion is new since the prior study. There is collapse of the right lower lobe and middle lobes. Left lung is clear. Cardiomediastinal contours are normal. IMPRESSION: New large right pleural effusion with collapse of the right middle and lower lobes. Electronically Signed   By: Ulyses Jarred M.D.   On: 02/03/2020 16:33   DG Lumbar Spine 1 View  Result  Date: 01/18/2020 CLINICAL DATA:  Right-sided low back pain EXAM: LUMBAR SPINE - 1 VIEW COMPARISON:  08/14/2018 FINDINGS: Single view of the lumbar spine demonstrates lumbar dextrocurvature with advanced degenerative disc disease throughout the lumbar spine, not significantly changed when compared to the prior study. No fractures appreciated on frontal view. Bilateral SI joints appear intact. IMPRESSION: Stable lumbar dextrocurvature with advanced degenerative disc disease throughout the lumbar spine. Electronically Signed   By: Davina Poke D.O.   On: 01/18/2020 12:55   DG Chest Portable 1 View  Result Date: 02/09/2020 CLINICAL DATA:  Shortness of breath and effusion EXAM: PORTABLE CHEST 1 VIEW COMPARISON:  February 03, 2020 FINDINGS: The heart size and mediastinal contours are partially obscured. There is near complete opacification of the right hemithorax with a small pocket of air seen within the right apex. The left lung is clear. No shift of the mediastinal structures. No acute osseous abnormality.  IMPRESSION: Near complete opacification of the right hemithorax which is likely due to large pleural effusion and atelectasis. Electronically Signed   By: Prudencio Pair M.D.   On: 02/09/2020 19:29   DG CHEST PORT 1 VIEW  Result Date: 02/03/2020 CLINICAL DATA:  Status post right thoracentesis. EXAM: PORTABLE CHEST 1 VIEW COMPARISON:  Radiograph earlier this day. FINDINGS: Decreased right pleural effusion post thoracentesis, small to moderate volume of pleural fluid persists. Residual pleural fluid may be partially loculated and tracking laterally. Associated right basilar opacity. No definite pneumothorax. Left lung hypoaerated but clear. Heart size normal. No large left pleural effusion. IMPRESSION: 1. Decreased right pleural effusion post thoracentesis. No definite pneumothorax. 2. Associated right basilar opacity likely atelectasis. Electronically Signed   By: Keith Rake M.D.   On: 02/03/2020 19:37   DG Chest Portable 1 View  Result Date: 01/17/2020 CLINICAL DATA:  Right-sided abdominal and chest pain, weakness EXAM: PORTABLE CHEST 1 VIEW COMPARISON:  01/05/2020 FINDINGS: The heart size and mediastinal contours are within normal limits. Both lungs are clear. The visualized skeletal structures are unremarkable. IMPRESSION: No active disease. Electronically Signed   By: Randa Ngo M.D.   On: 01/17/2020 12:42    Lab Data:  CBC: Recent Labs  Lab 02/03/20 1716 02/09/20 1941  WBC 4.1 3.1*  HGB 11.9* 11.9*  HCT 34.9* 34.5*  MCV 100.9* 102.1*  PLT 113* 740*   Basic Metabolic Panel: Recent Labs  Lab 02/03/20 1553 02/09/20 1941  NA 142 138  K 3.5 3.2*  CL 108 105  CO2 25 25  GLUCOSE 104* 95  BUN 40* 32*  CREATININE 1.44* 1.43*  CALCIUM 8.4* 8.1*   GFR: Estimated Creatinine Clearance: 25.5 mL/min (A) (by C-G formula based on SCr of 1.43 mg/dL (H)). Liver Function Tests: Recent Labs  Lab 02/03/20 1553 02/09/20 1941  AST 71* 69*  ALT 56* 52*  ALKPHOS 537* 548*  BILITOT  2.3* 1.7*  PROT 5.8* 5.5*  ALBUMIN 3.1* 2.8*   No results for input(s): LIPASE, AMYLASE in the last 168 hours. No results for input(s): AMMONIA in the last 168 hours. Coagulation Profile: No results for input(s): INR, PROTIME in the last 168 hours. Cardiac Enzymes: No results for input(s): CKTOTAL, CKMB, CKMBINDEX, TROPONINI in the last 168 hours. BNP (last 3 results) No results for input(s): PROBNP in the last 8760 hours. HbA1C: No results for input(s): HGBA1C in the last 72 hours. CBG: No results for input(s): GLUCAP in the last 168 hours. Lipid Profile: No results for input(s): CHOL, HDL, LDLCALC, TRIG, CHOLHDL, LDLDIRECT in the  last 72 hours. Thyroid Function Tests: No results for input(s): TSH, T4TOTAL, FREET4, T3FREE, THYROIDAB in the last 72 hours. Anemia Panel: No results for input(s): VITAMINB12, FOLATE, FERRITIN, TIBC, IRON, RETICCTPCT in the last 72 hours. Urine analysis:    Component Value Date/Time   COLORURINE YELLOW 01/01/2020 [place] 01/01/2020 1313   LABSPEC 1.008 01/01/2020 1313   PHURINE 5.0 01/01/2020 1313   GLUCOSEU NEGATIVE 01/01/2020 1313   HGBUR NEGATIVE 01/01/2020 1313   BILIRUBINUR NEGATIVE 01/01/2020 1313   KETONESUR NEGATIVE 01/01/2020 1313   PROTEINUR NEGATIVE 01/01/2020 1313   UROBILINOGEN 0.2 03/10/2009 1608   NITRITE NEGATIVE 01/01/2020 [place] 01/01/2020 1313     Brolin Dambrosia M.D. [facility] 02/10/2020, 11:23 AM   Call night coverage person covering after 7pm

## 2020-02-10 NOTE — Procedures (Signed)
PROCEDURE SUMMARY:  Successful US guided right thoracentesis. Yielded 1.2 L of hazy yellow fluid. Patient tolerated procedure well. No immediate complications. EBL = trace  Post procedure chest X-ray reveals no pneumothorax  Moderate effusion remaining to allow for placement of tunneled pleural catheter next week.  Erica Escobar S Aasha Dina PA-C 02/10/2020 1:00 PM

## 2020-02-10 NOTE — H&P (Signed)
Chief Complaint: Recurrent pleural effusion  Referring Physician(s): Rai, Vernelle Emerald, MD  Supervising Physician: Corrie Mckusick  Patient Status: [facility] - In-pt  History of Present Illness: Erica Escobar is a 84 y.o. female with history of non-hodgkin's lymphoma and adenocarcinoma of the ascending colon.  She is s/p right colectomy 2016. And has undergone chemotherapy for her lymphoma.  She also has chronic anemia,  CKD 3, COPD, and NASH cirrhosis.  She is known to our service for previous thoracentesis procedures on 1/18, 01/05/20, 02/03/20.  She presented to the ED yesterday with worsening shortness of breath.  Xray showed large effusion on the right.  She was apparently already on the schedule for placement of a PleurX.  We are asked to proceed with the pleural catheter placement sooner.  Per chart, she is a hospice patient with care in the home.  Past Medical History:  Diagnosis Date  . Abnormal tympanic membrane    right ear and mastoid problems  . Arthritis    oseoarthritis  . Cancer of ascending colon ([place]) 02/15/2015  . Constipation, chronic   . COPD (chronic obstructive pulmonary disease) (HCC)    mild COPD  . Depression   . Diverticulosis   . GERD (gastroesophageal reflux disease)   . Glaucoma    bilateral-sugery with laser, using eye drops  . Hemorrhoids    Internal  . History of hiatal hernia    dx. '87 Schatzki's ring  . Hyperlipidemia    good control  . Hypertension   . Insomnia    hx. of  . Keratosis, actinic   . Leg cramps   . Macular degeneration   . Neuromuscular disorder (HCC)    neuropathy legs,   . On aspirin at home    chronic use  . PONV (postoperative nausea and vomiting)   . Restless leg syndrome   . Schatzki's ring 1987  . Skin cancer    multiple, "tx. area right anterior wrist"  . Urinary incontinence    occ, stress    Past Surgical History:  Procedure Laterality Date  . ABDOMINAL HYSTERECTOMY    . BLADDER REPAIR      . CARDIAC CATHETERIZATION  2007, 2009  . CATARACT EXTRACTION Bilateral   . CHOLECYSTECTOMY    . COLECTOMY    . FINGER NAIL SURGERY    . FOOT SURGERY Right   . HEMORROIDECTOMY    . INNER EAR SURGERY Right    3-4 times  . INTRAOPERATIVE ARTERIOGRAM     right arm  . IR THORACENTESIS ASP PLEURAL SPACE W/IMG GUIDE  01/05/2020  . KNEE SURGERY Right   . LEG SURGERY Bilateral    laser ablation  . MASTOID DEBRIDEMENT     x2  . SKIN CANCER EXCISION     right/left thumb nail, left face, right ear/neck, right leg  . TOE FUSION    . TONSILLECTOMY      Allergies: Azithromycin, Allopurinol, Cephalexin, Codeine, Gabapentin, Niaspan [niacin], Oxybutynin, Paxil [paroxetine hcl], Penicillins, Pentazocine, Tizanidine, Tramadol, and Vicodin [hydrocodone-acetaminophen]  Medications: Prior to Admission medications   Medication Sig Start Date End Date Taking? Authorizing Provider  acetaminophen (TYLENOL) 325 MG tablet Take 325 mg by mouth in the morning and at bedtime.    Yes [provider]  betamethasone valerate (VALISONE) 0.1 % cream Apply 1 application topically 2 (two) times daily as needed (rash under breasts.).    Yes [provider]  calcium-vitamin D (OSCAL WITH D) 250-125 MG-UNIT tablet Take 1  tablet by mouth daily.   Yes [provider]  ENSURE (ENSURE) Take 237 mLs by mouth in the morning and at bedtime. VANILLA OR STRAWBERRY IF AVAILABLE PO DAILY    Yes [provider]  fludrocortisone (FLORINEF) 0.1 MG tablet Take 200 mcg by mouth daily. 12/12/19  Yes [provider]  furosemide (LASIX) 40 MG tablet Take 1 tablet (40 mg total) by mouth daily. 01/07/20  Yes Alma Friendly, MD  lactulose (CHRONULAC) 10 GM/15ML solution Take 20 g by mouth daily as needed for mild constipation.  11/13/19  Yes [provider]  Lidocaine 4 % PTCH Place 1 patch onto the skin daily. Remove & Discard patch within 12 hours or as directed by MD   Yes  [provider]  loratadine (CLARITIN) 10 MG tablet Take 10 mg by mouth daily as needed for allergies.   Yes [provider]  midodrine (PROAMATINE) 2.5 MG tablet Take 1 tablet (2.5 mg total) by mouth 2 (two) times daily with a meal. 10/19/19  Yes Guilford Shi, MD  mirtazapine (REMERON) 7.5 MG tablet Take 7.5 mg by mouth at bedtime.   Yes [provider]  Morphine Sulfate (MORPHINE CONCENTRATE) 10 mg / 0.5 ml concentrated solution Take 5 mg by mouth every 2 (two) hours as needed for shortness of breath.  01/23/20  Yes [provider]  Multiple Vitamin (MULTIVITAMIN WITH MINERALS) TABS tablet Take 1 tablet by mouth daily.   Yes [provider]  Multiple Vitamins-Minerals (OCUVITE PO) Take 1 tablet by mouth daily.   Yes [provider]  Omega-3 Fatty Acids (FISH OIL) 1000 MG CAPS Take 2,000 mg by mouth daily.    Yes [provider]  ondansetron (ZOFRAN) 4 MG tablet Take 1 tablet (4 mg total) by mouth every 8 (eight) hours as needed for nausea or vomiting. 08/24/19  Yes Ladell Pier, MD  pantoprazole (PROTONIX) 40 MG tablet Take 40 mg by mouth daily.   Yes [provider]  polyethylene glycol (MIRALAX / GLYCOLAX) 17 g packet Take 17 g by mouth daily as needed for moderate constipation.   Yes [provider]  acetaminophen (TYLENOL) 500 MG tablet Take 1 tablet (500 mg total) by mouth every 6 (six) hours as needed for mild pain or moderate pain. Patient not taking: Reported on 02/09/2020 01/19/20 01/18/21  Harold Hedge, MD  potassium chloride SA (KLOR-CON) 10 MEQ tablet Take 1 tablet (10 mEq total) by mouth daily for 14 days. Patient not taking: Reported on 02/08/2020 01/19/20 02/08/20  Harold Hedge, MD     Family History  Problem Relation Age of Onset  . Diabetes Mother   . Cancer Father   . Cancer Brother   . Cancer Brother   . Clotting disorder Child   . Hypertension Other     Social History   Socioeconomic  History  . Marital status: Widowed    Spouse name: Not on file  . Number of children: Not on file  . Years of education: Not on file  . Highest education level: Not on file  Occupational History  . Not on file  Tobacco Use  . Smoking status: Former Research scientist (life sciences)  . Smokeless tobacco: Never Used  . Tobacco comment: short time x1 - during pregnancy  Substance and Sexual Activity  . Alcohol use: No    Alcohol/week: 0.0 standard drinks  . Drug use: No  . Sexual activity: Not Currently  Other Topics Concern  . Not on  file  Social History Narrative  . Not on file   Social Determinants of Health   Financial Resource Strain:   . Difficulty of Paying Living Expenses: Not on file  Food Insecurity:   . Worried About Charity fundraiser in the Last Year: Not on file  . Ran Out of Food in the Last Year: Not on file  Transportation Needs:   . Lack of Transportation (Medical): Not on file  . Lack of Transportation (Non-Medical): Not on file  Physical Activity:   . Days of Exercise per Week: Not on file  . Minutes of Exercise per Session: Not on file  Stress:   . Feeling of Stress : Not on file  Social Connections:   . Frequency of Communication with Friends and Family: Not on file  . Frequency of Social Gatherings with Friends and Family: Not on file  . Attends Religious Services: Not on file  . Active Member of Clubs or Organizations: Not on file  . Attends Archivist Meetings: Not on file  . Marital Status: Not on file     Review of Systems: A 12 point ROS discussed and pertinent positives are indicated in the HPI above.  All other systems are negative.  Review of Systems  Vital Signs: BP 119/83 (BP Location: Left Arm)   Pulse 76   Temp 98 F (36.7 C) (Oral)   Resp 18   Ht 5' 3"  (1.6 m)   Wt 59 kg   SpO2 (!) 88%   BMI 23.03 kg/m   Physical Exam Vitals reviewed.  HENT:     Head: Normocephalic and atraumatic.  Eyes:     Extraocular Movements: Extraocular  movements intact.  Cardiovascular:     Rate and Rhythm: Normal rate and regular rhythm.  Pulmonary:     Effort: Pulmonary effort is normal. No respiratory distress.     Comments: Breath sounds diminished on the right secondary to effusion. Abdominal:     General: There is no distension.     Palpations: Abdomen is soft.     Tenderness: There is no abdominal tenderness.  Musculoskeletal:        General: Normal range of motion.  Skin:    General: Skin is warm and dry.  Neurological:     General: No focal deficit present.     Mental Status: She is alert and oriented to person, place, and time.  Psychiatric:        Mood and Affect: Mood normal.        Behavior: Behavior normal.        Thought Content: Thought content normal.        Judgment: Judgment normal.     Imaging: DG Chest 2 View  Result Date: 02/03/2020 CLINICAL DATA:  Shortness of breath EXAM: CHEST - 2 VIEW COMPARISON:  01/17/2020 FINDINGS: Large right pleural effusion is new since the prior study. There is collapse of the right lower lobe and middle lobes. Left lung is clear. Cardiomediastinal contours are normal. IMPRESSION: New large right pleural effusion with collapse of the right middle and lower lobes. Electronically Signed   By: Ulyses Jarred M.D.   On: 02/03/2020 16:33   DG Lumbar Spine 1 View  Result Date: 01/18/2020 CLINICAL DATA:  Right-sided low back pain EXAM: LUMBAR SPINE - 1 VIEW COMPARISON:  08/14/2018 FINDINGS: Single view of the lumbar spine demonstrates lumbar dextrocurvature with advanced degenerative disc disease throughout the lumbar spine, not significantly changed when compared to the  prior study. No fractures appreciated on frontal view. Bilateral SI joints appear intact. IMPRESSION: Stable lumbar dextrocurvature with advanced degenerative disc disease throughout the lumbar spine. Electronically Signed   By: Davina Poke D.O.   On: 01/18/2020 12:55   DG Chest Portable 1 View  Result Date:  02/09/2020 CLINICAL DATA:  Shortness of breath and effusion EXAM: PORTABLE CHEST 1 VIEW COMPARISON:  February 03, 2020 FINDINGS: The heart size and mediastinal contours are partially obscured. There is near complete opacification of the right hemithorax with a small pocket of air seen within the right apex. The left lung is clear. No shift of the mediastinal structures. No acute osseous abnormality. IMPRESSION: Near complete opacification of the right hemithorax which is likely due to large pleural effusion and atelectasis. Electronically Signed   By: Prudencio Pair M.D.   On: 02/09/2020 19:29   DG CHEST PORT 1 VIEW  Result Date: 02/03/2020 CLINICAL DATA:  Status post right thoracentesis. EXAM: PORTABLE CHEST 1 VIEW COMPARISON:  Radiograph earlier this day. FINDINGS: Decreased right pleural effusion post thoracentesis, small to moderate volume of pleural fluid persists. Residual pleural fluid may be partially loculated and tracking laterally. Associated right basilar opacity. No definite pneumothorax. Left lung hypoaerated but clear. Heart size normal. No large left pleural effusion. IMPRESSION: 1. Decreased right pleural effusion post thoracentesis. No definite pneumothorax. 2. Associated right basilar opacity likely atelectasis. Electronically Signed   By: Keith Rake M.D.   On: 02/03/2020 19:37   DG Chest Portable 1 View  Result Date: 01/17/2020 CLINICAL DATA:  Right-sided abdominal and chest pain, weakness EXAM: PORTABLE CHEST 1 VIEW COMPARISON:  01/05/2020 FINDINGS: The heart size and mediastinal contours are within normal limits. Both lungs are clear. The visualized skeletal structures are unremarkable. IMPRESSION: No active disease. Electronically Signed   By: Randa Ngo M.D.   On: 01/17/2020 12:42    Labs:  CBC: Recent Labs    01/17/20 1159 01/18/20 0546 02/03/20 1716 02/09/20 1941  WBC 3.3* 3.3* 4.1 3.1*  HGB 15.0 12.5 11.9* 11.9*  HCT 44.5 36.1 34.9* 34.5*  PLT 144* 127* 113*  138*    COAGS: Recent Labs    10/13/19 0453 01/01/20 1141  INR 1.0 1.0    BMP: Recent Labs    01/18/20 0546 01/19/20 0530 02/03/20 1553 02/09/20 1941  NA 138 138 142 138  K 5.1 3.8 3.5 3.2*  CL 104 105 108 105  CO2 25 24 25 25   GLUCOSE 103* 106* 104* 95  BUN 54* 53* 40* 32*  CALCIUM 8.9 8.5* 8.4* 8.1*  CREATININE 2.18* 1.84* 1.44* 1.43*  GFRNONAA 21* 25* 34* 34*  GFRAA 24* 29* 39* 40*    LIVER FUNCTION TESTS: Recent Labs    01/17/20 1159 01/18/20 0546 02/03/20 1553 02/09/20 1941  BILITOT 1.7* 2.0* 2.3* 1.7*  AST 59* 43* 71* 69*  ALT 57* 47* 56* 52*  ALKPHOS 426* 320* 537* 548*  PROT 7.2 5.8* 5.8* 5.5*  ALBUMIN 4.2 3.3* 3.1* 2.8*    TUMOR MARKERS: No results for input(s): AFPTM, CEA, CA199, CHROMGRNA in the last 8760 hours.  Assessment and Plan:  Recurrent pleural effusion. Has hospice care in the home.  Will proceed with placement of a tunneled pleural catheter next week as IR schedule allows.  Will make NPO after MN Sunday night in case it can be done Monday.  Thoracentesis done today, 1.2 liters removed. Moderate effusion remaining to allow for placement of PleurX.  Risks and benefits discussed with the patient  including bleeding, infection, damage to adjacent structures, malfunction of the catheter with need for additional procedures.  All of the patient's questions were answered, patient is agreeable to proceed. Consent signed and in chart.  Thank you for this interesting consult.  I greatly enjoyed meeting KALEY JUTRAS and look forward to participating in their care.  A copy of this report was sent to the requesting provider on this date.  Electronically Signed: Murrell Redden, PA-C   02/10/2020, 12:39 PM      I spent a total of  25 Minutes in face to face in clinical consultation, greater than 50% of which was counseling/coordinating care for PleurX placement.

## 2020-02-11 ENCOUNTER — Inpatient Hospital Stay (HOSPITAL_COMMUNITY)

## 2020-02-11 DIAGNOSIS — Z9689 Presence of other specified functional implants: Secondary | ICD-10-CM

## 2020-02-11 DIAGNOSIS — Z66 Do not resuscitate: Secondary | ICD-10-CM

## 2020-02-11 DIAGNOSIS — J90 Pleural effusion, not elsewhere classified: Secondary | ICD-10-CM

## 2020-02-11 LAB — CBC
HCT: 29.1 % — ABNORMAL LOW (ref 36.0–46.0)
Hemoglobin: 9.8 g/dL — ABNORMAL LOW (ref 12.0–15.0)
MCH: 34.6 pg — ABNORMAL HIGH (ref 26.0–34.0)
MCHC: 33.7 g/dL (ref 30.0–36.0)
MCV: 102.8 fL — ABNORMAL HIGH (ref 80.0–100.0)
Platelets: 115 10*3/uL — ABNORMAL LOW (ref 150–400)
RBC: 2.83 MIL/uL — ABNORMAL LOW (ref 3.87–5.11)
RDW: 15.4 % (ref 11.5–15.5)
WBC: 2.7 10*3/uL — ABNORMAL LOW (ref 4.0–10.5)
nRBC: 0 % (ref 0.0–0.2)

## 2020-02-11 LAB — BASIC METABOLIC PANEL
Anion gap: 5 (ref 5–15)
BUN: 33 mg/dL — ABNORMAL HIGH (ref 8–23)
CO2: 27 mmol/L (ref 22–32)
Calcium: 7.9 mg/dL — ABNORMAL LOW (ref 8.9–10.3)
Chloride: 107 mmol/L (ref 98–111)
Creatinine, Ser: 1.19 mg/dL — ABNORMAL HIGH (ref 0.44–1.00)
GFR calc Af Amer: 50 mL/min — ABNORMAL LOW (ref >60)
GFR calc non Af Amer: 43 mL/min — ABNORMAL LOW (ref >60)
Glucose, Bld: 94 mg/dL (ref 70–99)
Potassium: 4 mmol/L (ref 3.5–5.1)
Sodium: 139 mmol/L (ref 135–145)

## 2020-02-11 MED ORDER — MORPHINE SULFATE (CONCENTRATE) 10 MG/0.5ML PO SOLN
5.0000 mg | Freq: Four times a day (QID) | ORAL | Status: DC | PRN
  Administered 2020-02-11: 5 mg via ORAL
  Filled 2020-02-11: qty 0.5

## 2020-02-11 NOTE — Progress Notes (Signed)
Pleurex drain put in by Dr. Valeta Harms. Dressing is clean and dry. Located Right mid back, 1600 ml removed by Pleurex. Dr. Tana Coast notified that this procedure was done this afternoon instead of tomorrow which it was originally planned for and she spoke to her son and was told she can be discharged now. Son did not come up to the floor, he waited in his car for his mother to be discharged via wheelchair. Discharged instructions put in patient's bag with her belongings, medicated for pain prior to leaving. Patient states she doesn't use O2 at home. O2 sats  was 94% on room air.

## 2020-02-11 NOTE — Discharge Summary (Signed)
Physician Discharge Summary   Patient ID: Erica Escobar MRN: [id number] DOB/AGE: 1936/03/29 84 y.o.  Admit date: 02/09/2020 Discharge date: 02/11/2020  Primary Care Physician:  Deland Pretty, MD   Recommendations for Outpatient Follow-up:  1. Follow up with PCP in 1-2 weeks 2. Pleurx catheter placed by Dr Valeta Harms, pulmonology, needs follow-up in 10 days for suture removal.  3. Pleural effusion to be drained via Plerux by home hospice   Home Health: patient currently under home hospice  Equipment/Devices:   Discharge Condition: stable for discharge  CODE STATUS: DNR   Diet recommendation: regular    Discharge Diagnoses:       Recurrent right sided pleural effusion s/p thoracentesis and pleurx catheter  . CKD (chronic kidney disease), stage III . Pancytopenia ([place]) . Recurrent right pleural effusion . Hepatic cirrhosis ([place]) . DNR (do not resuscitate) . 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 chronic back pain . Malignant pleural effusion   Consults:   IR  Pulmonology, Dr Valeta Harms    Allergies:   Allergies  Allergen Reactions  . Azithromycin Itching  . Allopurinol Other (See Comments)    Unknown  . Cephalexin Other (See Comments)    Unknown  . Codeine Itching  . Gabapentin Swelling and Other (See Comments)    Throat and leg swelling  . Niaspan [Niacin] Other (See Comments)    Unknown  . Oxybutynin Other (See Comments)    "made me go crazy"-- had crazy dreams.   . Paxil [Paroxetine Hcl] Other (See Comments)    Unknown reaction  . Penicillins Itching and Other (See Comments)    Did it involve swelling of the face/tongue/throat, SOB, or low BP? No Did it involve sudden or severe rash/hives, skin peeling, or any reaction on the inside of your mouth or nose? No Did you need to seek medical attention at a hospital or doctor's office? No When did it last happen?10 + years If all above answers are "NO", may proceed with cephalosporin use.    Marland Kitchen Pentazocine Itching,  Nausea Only and Other (See Comments)    Headache.   . Tizanidine Other (See Comments)    "made me crazy"  . Tramadol Other (See Comments)    Head feels "swimmy" and weakness  . Vicodin [Hydrocodone-Acetaminophen] Other (See Comments)    "made me go crazy"     DISCHARGE MEDICATIONS: Allergies as of 02/11/2020      Reactions   Azithromycin Itching   Allopurinol Other (See Comments)   Unknown   Cephalexin Other (See Comments)   Unknown   Codeine Itching   Gabapentin Swelling, Other (See Comments)   Throat and leg swelling   Niaspan [niacin] Other (See Comments)   Unknown   Oxybutynin Other (See Comments)   "made me go crazy"-- had crazy dreams.    Paxil [paroxetine Hcl] Other (See Comments)   Unknown reaction   Penicillins Itching, Other (See Comments)   Did it involve swelling of the face/tongue/throat, SOB, or low BP? No Did it involve sudden or severe rash/hives, skin peeling, or any reaction on the inside of your mouth or nose? No Did you need to seek medical attention at a hospital or doctor's office? No When did it last happen?10 + years If all above answers are "NO", may proceed with cephalosporin use.   Pentazocine Itching, Nausea Only, Other (See Comments)   Headache.    Tizanidine Other (See Comments)   "made me crazy"   Tramadol Other (See Comments)   Head feels "swimmy"  and weakness   Vicodin [hydrocodone-acetaminophen] Other (See Comments)   "made me go crazy"      Medication List    TAKE these medications   acetaminophen 325 MG tablet Commonly known as: TYLENOL Take 325 mg by mouth in the morning and at bedtime. What changed: Another medication with the same name was removed. Continue taking this medication, and follow the directions you see here.   betamethasone valerate 0.1 % cream Commonly known as: VALISONE Apply 1 application topically 2 (two) times daily as needed (rash under breasts.).   calcium-vitamin D 250-125 MG-UNIT tablet Commonly  known as: OSCAL WITH D Take 1 tablet by mouth daily.   Ensure Take 237 mLs by mouth in the morning and at bedtime. VANILLA OR STRAWBERRY IF AVAILABLE PO DAILY   Fish Oil 1000 MG Caps Take 2,000 mg by mouth daily.   fludrocortisone 0.1 MG tablet Commonly known as: FLORINEF Take 200 mcg by mouth daily.   furosemide 40 MG tablet Commonly known as: LASIX Take 1 tablet (40 mg total) by mouth daily.   lactulose 10 GM/15ML solution Commonly known as: CHRONULAC Take 20 g by mouth daily as needed for mild constipation.   Lidocaine 4 % Ptch Place 1 patch onto the skin daily. Remove & Discard patch within 12 hours or as directed by MD   loratadine 10 MG tablet Commonly known as: CLARITIN Take 10 mg by mouth daily as needed for allergies.   midodrine 2.5 MG tablet Commonly known as: PROAMATINE Take 1 tablet (2.5 mg total) by mouth 2 (two) times daily with a meal.   mirtazapine 7.5 MG tablet Commonly known as: REMERON Take 7.5 mg by mouth at bedtime.   morphine CONCENTRATE 10 mg / 0.5 ml concentrated solution Take 5 mg by mouth every 2 (two) hours as needed for shortness of breath.   multivitamin with minerals Tabs tablet Take 1 tablet by mouth daily.   OCUVITE PO Take 1 tablet by mouth daily.   ondansetron 4 MG tablet Commonly known as: Zofran Take 1 tablet (4 mg total) by mouth every 8 (eight) hours as needed for nausea or vomiting.   pantoprazole 40 MG tablet Commonly known as: PROTONIX Take 40 mg by mouth daily.   polyethylene glycol 17 g packet Commonly known as: MIRALAX / GLYCOLAX Take 17 g by mouth daily as needed for moderate constipation.        Brief H and P: For complete details please refer to admission H and P, but in brief Patient is a 84 year old female with history of non-Hodgkin lymphoma status post radiation, chemo in remission, adenocarcinoma of ascending colon status post right colectomy 2016, history of multiple skin cancers, chronic anemia, CKD  3, COPD, Karlene Lineman cirrhosis with recurrent right sided pleural effusion/hepatic hydrothorax status post thoracentesis on 1/18, 1/22, 02/03/2020, presented with acute worsening shortness of breath.  She has been home with hospice. Patient was recently seen on 02/03/2020 in ED for malignant pleural effusion and had a bedside thoracentesis done by critical care.  Outpatient referral was made to IR to get Pleurx catheter, scheduled next week In ED, afebrile, tachycardia, tachypnea requiring 3 L O2, chest x-ray showed large pleural effusion of the right hemithorax. Patient was admitted for thoracentesis and Pleurx catheter.   Hospital Course:   Recurrent right-sided pleural effusion/hepatic hydrothorax in the setting of [place] cirrhosis - Recurrent pleural effusion requiring thoracentesis - Status post Ultrasound-guided right thoracentesis done on 2/27 yielding 1.2 L fluid - She was scheduled  to have pleurx cath on Tuesday 3/2 outpatient with Dr Valeta Harms.  - Dr Valeta Harms placed the pleurx drainage inpatient on the right. Hospice nurse to drain at home, outpatient follow-up in 10 days.   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 No chest pain or fever or chills. Pain controlled   BP 119/84 (BP Location: Left Arm)   Pulse 63   Temp 98.6 F (37 C) (Oral)   Resp 16   Ht 5' 3"  (1.6 m)   Wt 59 kg   SpO2 98%   BMI 23.03 kg/m   Physical Exam: General: Alert and awake oriented x3 not in any acute distress. HEENT: anicteric sclera, pupils  reactive to light and accommodation CVS: S1-S2 clear no murmur rubs or gallops Chest: diminished bs on the right  Abdomen: soft nontender, nondistended, normal bowel sounds Extremities: no cyanosis, clubbing or edema noted bilaterally Neuro: Cranial nerves II-XII intact, no focal neurological deficits    Get Medicines reviewed and adjusted: Please take all your medications with you for your next visit with your Primary MD  Please request your Primary MD to go over all hospital tests and procedure/radiological results at the follow up. Please ask your Primary MD to get all Hospital records sent to his/her office.  If you experience worsening of your admission symptoms, develop shortness of breath, life threatening emergency, suicidal or homicidal thoughts you must seek medical attention immediately by calling 911 or calling your MD immediately  if symptoms less severe.  You must read complete instructions/literature along with all the possible adverse reactions/side effects for all the Medicines you take and that have been prescribed to you. Take any new Medicines after you have completely understood and accept all the possible adverse reactions/side effects.   Do not drive when taking pain medications.   Do not take more than prescribed Pain, Sleep and Anxiety Medications  Special Instructions: If you have smoked or chewed Tobacco  in the last 2 yrs please stop smoking, stop any regular Alcohol  and or any Recreational drug use.  Wear Seat belts while driving.  Please note  You were cared for by a hospitalist during your hospital stay. Once you are discharged, your primary care physician will handle any further medical issues. Please note that NO REFILLS for any discharge medications will be authorized once you are discharged, as it is imperative that you return to your primary care physician (or establish a relationship with a primary care physician if you do not have one) for your  aftercare needs so that they can reassess your need for medications and monitor your lab values.   The results of significant diagnostics from this hospitalization (including imaging, microbiology, ancillary and laboratory) are listed below for reference.      Procedures/Studies:  DG Chest 1 View  Result Date: 02/11/2020 CLINICAL DATA:  Status post PleurX catheter insertion. COPD. Hypertension. Ex-smoker. EXAM: CHEST  1 VIEW COMPARISON:  1 day prior FINDINGS: Right-sided pleural catheter in place. Significantly decreased right-sided pleural fluid. Normal heart size. No pneumothorax. Improved right-sided aeration with mild areas of infrahilar and basilar atelectasis remaining. IMPRESSION:  Placement of a right-sided pleural catheter with significantly decreased pleural fluid and improved aeration. No pneumothorax or other acute complication. Electronically Signed   By: Abigail Miyamoto M.D.   On: 02/11/2020 17:15   DG Chest 1 View  Result Date: 02/10/2020 CLINICAL DATA:  84 year old female status post thoracentesis EXAM: CHEST  1 VIEW COMPARISON:  02/09/2020 FINDINGS: Cardiomediastinal silhouette unchanged though partially obscured by overlying lung and pleural disease. Opacity in the right chest persists though decreased from the prior with no pneumothorax status post thoracentesis. Left lung relatively well aerated. IMPRESSION: Decreased right-sided pleural fluid status post thoracentesis with no pneumothorax. Electronically Signed   By: Corrie Mckusick D.O.   On: 02/10/2020 12:50   DG Chest 2 View  Result Date: 02/03/2020 CLINICAL DATA:  Shortness of breath EXAM: CHEST - 2 VIEW COMPARISON:  01/17/2020 FINDINGS: Large right pleural effusion is new since the prior study. There is collapse of the right lower lobe and middle lobes. Left lung is clear. Cardiomediastinal contours are normal. IMPRESSION: New large right pleural effusion with collapse of the right middle and lower lobes. Electronically Signed    By: Ulyses Jarred M.D.   On: 02/03/2020 16:33   DG Lumbar Spine 1 View  Result Date: 01/18/2020 CLINICAL DATA:  Right-sided low back pain EXAM: LUMBAR SPINE - 1 VIEW COMPARISON:  08/14/2018 FINDINGS: Single view of the lumbar spine demonstrates lumbar dextrocurvature with advanced degenerative disc disease throughout the lumbar spine, not significantly changed when compared to the prior study. No fractures appreciated on frontal view. Bilateral SI joints appear intact. IMPRESSION: Stable lumbar dextrocurvature with advanced degenerative disc disease throughout the lumbar spine. Electronically Signed   By: Davina Poke D.O.   On: 01/18/2020 12:55   DG Chest Portable 1 View  Result Date: 02/09/2020 CLINICAL DATA:  Shortness of breath and effusion EXAM: PORTABLE CHEST 1 VIEW COMPARISON:  February 03, 2020 FINDINGS: The heart size and mediastinal contours are partially obscured. There is near complete opacification of the right hemithorax with a small pocket of air seen within the right apex. The left lung is clear. No shift of the mediastinal structures. No acute osseous abnormality. IMPRESSION: Near complete opacification of the right hemithorax which is likely due to large pleural effusion and atelectasis. Electronically Signed   By: Prudencio Pair M.D.   On: 02/09/2020 19:29   DG CHEST PORT 1 VIEW  Result Date: 02/03/2020 CLINICAL DATA:  Status post right thoracentesis. EXAM: PORTABLE CHEST 1 VIEW COMPARISON:  Radiograph earlier this day. FINDINGS: Decreased right pleural effusion post thoracentesis, small to moderate volume of pleural fluid persists. Residual pleural fluid may be partially loculated and tracking laterally. Associated right basilar opacity. No definite pneumothorax. Left lung hypoaerated but clear. Heart size normal. No large left pleural effusion. IMPRESSION: 1. Decreased right pleural effusion post thoracentesis. No definite pneumothorax. 2. Associated right basilar opacity likely  atelectasis. Electronically Signed   By: Keith Rake M.D.   On: 02/03/2020 19:37   DG Chest Portable 1 View  Result Date: 01/17/2020 CLINICAL DATA:  Right-sided abdominal and chest pain, weakness EXAM: PORTABLE CHEST 1 VIEW COMPARISON:  01/05/2020 FINDINGS: The heart size and mediastinal contours are within normal limits. Both lungs are clear. The visualized skeletal structures are unremarkable. IMPRESSION: No active disease. Electronically Signed   By: Randa Ngo M.D.   On: 01/17/2020 12:42   US THORACENTESIS ASP PLEURAL SPACE W/IMG GUIDE  Result Date: 02/10/2020 INDICATION: Cirrhosis, COPD, non-Hodgkin's lymphoma, colon cancer, chronic kidney disease,  recurrent right pleural effusion/hepatic hydrothorax; request for therapeutic right thoracentesis. EXAM: ULTRASOUND GUIDED RIGHT THORACENTESIS MEDICATIONS: 1% Lidocaine, 10 mL COMPLICATIONS: None immediate. PROCEDURE: An ultrasound guided thoracentesis was thoroughly discussed with the patient and questions answered. The benefits, risks, alternatives and complications were also discussed. The patient understands and wishes to proceed with the procedure. Written consent was obtained. Ultrasound was performed to localize and mark an adequate pocket of fluid in the right chest. The area was then prepped and draped in the normal sterile fashion. 1% Lidocaine was used for local anesthesia. Under ultrasound guidance a 6 Fr Safe-T-Centesis catheter was introduced. Thoracentesis was performed. The catheter was removed and a dressing applied. FINDINGS: A total of approximately 1.2 L of hazy yellow fluid was removed. IMPRESSION: Successful ultrasound guided right thoracentesis yielding 1.2 L of pleural fluid. Moderate effusion remaining to allow for placement for tunneled pleural catheter No pneumothorax on post-procedure chest x-ray. Read by: Gareth Eagle, PA-C Electronically Signed   By: Corrie Mckusick D.O.   On: 02/10/2020 13:00      LAB RESULTS: Basic  Metabolic Panel: Recent Labs  Lab 02/09/20 1941 02/11/20 0517  NA 138 139  K 3.2* 4.0  CL 105 107  CO2 25 27  GLUCOSE 95 94  BUN 32* 33*  CREATININE 1.43* 1.19*  CALCIUM 8.1* 7.9*   Liver Function Tests: Recent Labs  Lab 02/09/20 1941  AST 69*  ALT 52*  ALKPHOS 548*  BILITOT 1.7*  PROT 5.5*  ALBUMIN 2.8*   No results for input(s): LIPASE, AMYLASE in the last 168 hours. No results for input(s): AMMONIA in the last 168 hours. CBC: Recent Labs  Lab 02/09/20 1941 02/09/20 1941 02/11/20 0517  WBC 3.1*  --  2.7*  HGB 11.9*  --  9.8*  HCT 34.5*  --  29.1*  MCV 102.1*   < > 102.8*  PLT 138*  --  115*   < > = values in this interval not displayed.   Cardiac Enzymes: No results for input(s): CKTOTAL, CKMB, CKMBINDEX, TROPONINI in the last 168 hours. BNP: Invalid input(s): POCBNP CBG: No results for input(s): GLUCAP in the last 168 hours.     Disposition and Follow-up: Discharge Instructions    Ambulatory Pleural Drainage Schedule   Complete by: As directed    Drain daily, up to max of 1L until patient is only able to drain out 12m. If <154mfor 3 consecutive drains every other day, then call Interventional Radiology [phone]) for evaluation and possible removal.   Diet - low sodium heart healthy   Complete by: As directed    Increase activity slowly   Complete by: As directed        DISPOSITION: home with hospice    DISCHARGE FOLLOW-UP Follow-up Information    Icard, Bradley L, DO Follow up in 10 day(s).   Specialty: Pulmonary Disease Why: please call office tomorrow to confirm follow-up appt in 10 days  Contact information: [phone]          Time coordinating discharge:  35 mins   Signed:   RiEstill Cotta.D. [facility] 02/11/2020, 5:32 PM

## 2020-02-11 NOTE — Consult Note (Signed)
NAME:  Erica Escobar, MRN:  [id number], DOB:  1936/03/10, LOS: 1 ADMISSION DATE:  02/09/2020, CONSULTATION DATE: 02/11/2020 REFERRING MD: Dr. Nira Conn, CHIEF COMPLAINT: Right-sided pleural effusion  Brief History   84 year old female past medical history of cirrhosis, recently seen in hospital a few weeks ago discharged on hospice.  Now with continued recurrent hepatic hydrothorax several thoracentesis drainages and now plans for Pleurx catheter placement.  History of present illness   This is an 84 year old female past medical history of cirrhosis, recently seen in the hospital 2 weeks ago discharged on hospice found to have recurrent hepatic hydrothorax.  She had several drainages.  Has been to the ER since her discharge.  She was plan to follow-up with me.  Patient with plans for insertion of new when indwelling pleural catheter on Tuesday of next week.  However patient was admitted over the weekend to [facility] long hospital due to increasing shortness of breath.  Additionally has medical history of COPD, CKD 3, type 2 diabetes, GERD, cirrhosis.  Developed nonalcoholic cirrhosis and underwent paracentesis back in October.  Ultimately has recurrent right-sided effusion.  Covid negative on admission.  Past Medical History  Arthritis COPD Cancer of ascending colon Depression GERD HLD HTN Insomnia  Cirrhosis of liver  CKD III  FTT Non-Hodgkin's Lymphoma  Chronic back pain RLS  Significant Hospital Events     Consults:  IR Pulmonary  Procedures:  Pleurx catheter placement 02/11/2020 Thoracentesis  Significant Diagnostic Tests:    Micro Data:    Antimicrobials:     Interim history/subjective:  Per HPI above.  Patient states that she like to have her catheter placed that she could go home again.  Objective   Blood pressure 119/84, pulse 63, temperature 98.6 F (37 C), temperature source Oral, resp. rate 16, height 5' 3"  (1.6 m), weight 59 kg, SpO2 98 %.         Intake/Output Summary (Last 24 hours) at 02/11/2020 1658 Last data filed at 02/11/2020 1034 Gross per 24 hour  Intake 240 ml  Output --  Net 240 ml   Filed Weights   02/09/20 1903  Weight: 59 kg    Examination: General: Elderly female, somewhat cachectic.  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 DNR on home hospice care. Plan: Discussed risk benefits and alternatives of proceeding with invasive procedure. Discussed utilization of palliative indwelling pleural catheter with patient. Patient agreed to have this placed today at bedside here at [facility]. I called and spoke with the patient's son Carloyn Manner.  This will help facilitate patient's discharge from the hospital as she no longer needs to wait for this procedure. She had plans to have this done on Tuesday of next week with me outpatient.  This will also save her an admission to the hospital for an outpatient procedure. No additional samples needed from this pleural fluid. Our office will arrange Pleurx catheter follow-up. Patient to see Dr. Tamala Julian on March 9 or 10 in office for suture removal.  And site check.   Labs   CBC: Recent Labs  Lab 02/09/20 1941 02/11/20 0517  WBC 3.1* 2.7*  HGB 11.9* 9.8*  HCT 34.5* 29.1*  MCV 102.1* 102.8*  PLT 138* 115*    Basic Metabolic Panel: Recent Labs  Lab 02/09/20 1941 02/11/20 0517  NA 138 139  K 3.2* 4.0  CL 105 107  CO2 25 27  GLUCOSE 95 94  BUN 32* 33*  CREATININE 1.43* 1.19*  CALCIUM 8.1* 7.9*    GFR: Estimated Creatinine Clearance: 30.7 mL/min (A) (by C-G formula based on SCr of 1.19 mg/dL (H)). Recent Labs  Lab 02/09/20 1941 02/11/20 0517  WBC 3.1* 2.7*    Liver Function Tests: Recent Labs  Lab 02/09/20 1941  AST 69*  ALT 52*  ALKPHOS 548*  BILITOT 1.7*  PROT 5.5*  ALBUMIN 2.8*   No results for input(s): LIPASE, AMYLASE in the last 168 hours. No results for input(s): AMMONIA in the last 168 hours.  ABG    Component Value Date/Time   TCO2 26 03/10/2009 1712     Coagulation Profile: No results for input(s): INR, PROTIME in the last 168 hours.  Cardiac Enzymes: No results for input(s): CKTOTAL, CKMB, CKMBINDEX, TROPONINI in the last 168 hours.  HbA1C: Hgb A1c MFr Bld  Date/Time Value Ref Range Status  02/11/2015 01:30 PM 5.1 4.8 - 5.6 % Final    Comment:    (NOTE)         Pre-diabetes: 5.7 - 6.4         Diabetes: >6.4         Glycemic control for adults with diabetes: <7.0   03/10/2009 11:59 PM  4.6 - 6.1 % Final   4.9 (NOTE)   The ADA recommends the following therapeutic goal for glycemic   control related to Hgb A1C measurement:   Goal of Therapy:   < 7.0% Hgb A1C   Reference: American Diabetes Association: Clinical Practice   Recommendations 2008, Diabetes Care,  2008, 31:(Suppl 1).    CBG: No results for input(s): GLUCAP in the last 168 hours.  Review of Systems:   Review of Systems  Constitutional: Negative for chills, fever, malaise/fatigue and weight loss.  HENT: Negative for hearing loss, sore throat and tinnitus.   Eyes: Negative for blurred vision and double vision.  Respiratory: Positive for cough and shortness of breath. Negative for hemoptysis, sputum production, wheezing and stridor.   Cardiovascular: Negative for chest pain, palpitations, orthopnea, leg swelling and PND.  Gastrointestinal: Negative for abdominal pain, constipation, diarrhea, heartburn, nausea and vomiting.  Genitourinary: Negative for dysuria, hematuria and  urgency.  Musculoskeletal: Negative for joint pain and myalgias.  Skin: Negative for itching and rash.  Neurological: Negative for dizziness, tingling, weakness and headaches.  Endo/Heme/Allergies: Negative for environmental allergies. Does not bruise/bleed easily.  Psychiatric/Behavioral: Negative for depression. The patient is not nervous/anxious and does not have insomnia.   All other systems reviewed and are negative.    Past Medical History  She,  has a past medical history of Abnormal tympanic membrane, Arthritis, Cancer of ascending colon ([place]) (02/15/2015), Constipation, chronic, COPD (chronic obstructive pulmonary disease) ([place]), Depression, Diverticulosis, GERD (gastroesophageal reflux disease), Glaucoma, Hemorrhoids, History of hiatal hernia, Hyperlipidemia, Hypertension, Insomnia, Keratosis, actinic, Leg cramps, Macular degeneration, Neuromuscular disorder ([place]), On aspirin at home, PONV (postoperative nausea and vomiting), Restless leg syndrome, Schatzki's ring (1987), Skin cancer, and Urinary incontinence.   Surgical History    Past Surgical History:  Procedure Laterality Date  . ABDOMINAL HYSTERECTOMY    . BLADDER REPAIR    . CARDIAC CATHETERIZATION  2007, 2009  . CATARACT EXTRACTION Bilateral   .  CHOLECYSTECTOMY    . COLECTOMY    . FINGER NAIL SURGERY    . FOOT SURGERY Right   . HEMORROIDECTOMY    . INNER EAR SURGERY Right    3-4 times  . INTRAOPERATIVE ARTERIOGRAM     right arm  . IR THORACENTESIS ASP PLEURAL SPACE W/IMG GUIDE  01/05/2020  . KNEE SURGERY Right   . LEG SURGERY Bilateral    laser ablation  . MASTOID DEBRIDEMENT     x2  . SKIN CANCER EXCISION     right/left thumb nail, left face, right ear/neck, right leg  . TOE FUSION    . TONSILLECTOMY       Social History   reports that she has quit smoking. She has never used smokeless tobacco. She reports that she does not drink alcohol or use drugs.   Family History   Her family history includes Cancer  in her brother, brother, and father; Clotting disorder in her child; Diabetes in her mother; Hypertension in an other family member.   Allergies Allergies  Allergen Reactions  . Azithromycin Itching  . Allopurinol Other (See Comments)    Unknown  . Cephalexin Other (See Comments)    Unknown  . Codeine Itching  . Gabapentin Swelling and Other (See Comments)    Throat and leg swelling  . Niaspan [Niacin] Other (See Comments)    Unknown  . Oxybutynin Other (See Comments)    "made me go crazy"-- had crazy dreams.   . Paxil [Paroxetine Hcl] Other (See Comments)    Unknown reaction  . Penicillins Itching and Other (See Comments)    Did it involve swelling of the face/tongue/throat, SOB, or low BP? No Did it involve sudden or severe rash/hives, skin peeling, or any reaction on the inside of your mouth or nose? No Did you need to seek medical attention at a hospital or doctor's office? No When did it last happen?10 + years If all above answers are "NO", may proceed with cephalosporin use.    Marland Kitchen Pentazocine Itching, Nausea Only and Other (See Comments)    Headache.   . Tizanidine Other (See Comments)    "made me crazy"  . Tramadol Other (See Comments)    Head feels "swimmy" and weakness  . Vicodin [Hydrocodone-Acetaminophen] Other (See Comments)    "made me go crazy"     Home Medications  Prior to Admission medications   Medication Sig Start Date End Date Taking? Authorizing Provider  acetaminophen (TYLENOL) 325 MG tablet Take 325 mg by mouth in the morning and at bedtime.    Yes [provider]  betamethasone valerate (VALISONE) 0.1 % cream Apply 1 application topically 2 (two) times daily as needed (rash under breasts.).    Yes [provider]  calcium-vitamin D (OSCAL WITH D) 250-125 MG-UNIT tablet Take 1 tablet by mouth daily.   Yes [provider]  ENSURE (ENSURE) Take 237 mLs by mouth in the morning and at bedtime. VANILLA OR STRAWBERRY IF  AVAILABLE PO DAILY    Yes [provider]  fludrocortisone (FLORINEF) 0.1 MG tablet Take 200 mcg by mouth daily. 12/12/19  Yes [provider]  furosemide (LASIX) 40 MG tablet Take 1 tablet (40 mg total) by mouth daily. 01/07/20  Yes Alma Friendly, MD  lactulose (CHRONULAC) 10 GM/15ML solution Take 20 g by mouth daily as needed for mild constipation.  11/13/19  Yes [provider]  Lidocaine 4 % PTCH Place 1 patch onto the skin daily. Remove &  Discard patch within 12 hours or as directed by MD   Yes [provider]  loratadine (CLARITIN) 10 MG tablet Take 10 mg by mouth daily as needed for allergies.   Yes [provider]  midodrine (PROAMATINE) 2.5 MG tablet Take 1 tablet (2.5 mg total) by mouth 2 (two) times daily with a meal. 10/19/19  Yes Guilford Shi, MD  mirtazapine (REMERON) 7.5 MG tablet Take 7.5 mg by mouth at bedtime.   Yes [provider]  Morphine Sulfate (MORPHINE CONCENTRATE) 10 mg / 0.5 ml concentrated solution Take 5 mg by mouth every 2 (two) hours as needed for shortness of breath.  01/23/20  Yes [provider]  Multiple Vitamin (MULTIVITAMIN WITH MINERALS) TABS tablet Take 1 tablet by mouth daily.   Yes [provider]  Multiple Vitamins-Minerals (OCUVITE PO) Take 1 tablet by mouth daily.   Yes [provider]  Omega-3 Fatty Acids (FISH OIL) 1000 MG CAPS Take 2,000 mg by mouth daily.    Yes [provider]  ondansetron (ZOFRAN) 4 MG tablet Take 1 tablet (4 mg total) by mouth every 8 (eight) hours as needed for nausea or vomiting. 08/24/19  Yes Ladell Pier, MD  pantoprazole (PROTONIX) 40 MG tablet Take 40 mg by mouth daily.   Yes [provider]  polyethylene glycol (MIRALAX / GLYCOLAX) 17 g packet Take 17 g by mouth daily as needed for moderate constipation.   Yes [provider]  acetaminophen (TYLENOL) 500 MG tablet Take 1 tablet (500 mg total) by mouth every 6  (six) hours as needed for mild pain or moderate pain. Patient not taking: Reported on 02/09/2020 01/19/20 01/18/21  Harold Hedge, MD  potassium chloride SA (KLOR-CON) 10 MEQ tablet Take 1 tablet (10 mEq total) by mouth daily for 14 days. Patient not taking: Reported on 02/08/2020 01/19/20 02/08/20  Harold Hedge, MD     Garner Nash, DO [facility] 02/11/2020 7:04 PM

## 2020-02-11 NOTE — Progress Notes (Addendum)
[facility]                                                                              Patient Demographics  Erica Escobar, is a 84 y.o. female, DOB - [birth year], BUL:[id number]  Admit date - 02/09/2020   Admitting Physician Orene Desanctis, DO  Outpatient Primary MD for the patient is Erica Pretty, MD  Outpatient specialists:   LOS - 1  days   Medical records reviewed and are as summarized below:    Chief Complaint  Patient presents with  . Shortness of Breath  . Chest Pain       Brief summary   Patient is a 84 year old female with history of non-Hodgkin lymphoma status post radiation, chemo in remission, adenocarcinoma of ascending colon status post right colectomy 2016, history of multiple skin cancers, chronic anemia, CKD 3, COPD, Karlene Lineman cirrhosis with recurrent right sided pleural effusion/hepatic hydrothorax status post thoracentesis on 1/18, 1/22, 02/03/2020, presented with acute worsening shortness of breath.  She has been home with hospice. Patient was recently seen on 02/03/2020 in ED for malignant pleural effusion and had a bedside thoracentesis done by critical care.  Outpatient referral was made to IR to get Pleurx catheter, scheduled next week In ED, afebrile, tachycardia, tachypnea requiring 3 L O2, chest x-ray showed large pleural effusion of the right hemithorax. Patient was admitted for thoracentesis and Pleurx catheter.  IR consulted  Assessment & Plan    Recurrent right-sided pleural effusion/hepatic hydrothorax in the setting of Nash cirrhosis - Recurrent pleural effusion requiring thoracentesis - Status post Ultrasound-guided right thoracentesis done on 2/27 yielding 1.2 L fluid - Per IR, tunneled pleural catheter will be done on Monday, 02/12/2020, will keep n.p.o. after midnight Addendum: Just spoke with Dr Valeta Harms (CCM), will do pleurx cath today if possible. Very much appreciative of Dr. Juline Patch assistance.   Active Problems:   Large cell  lymphoma ([place]), non-Hodgkin's lymphoma -Currently in remission  Pancytopenia Likely secondary to cirrhosis, lymphoma  Chronic postural hypotension Continue midodrine, fludrocortisone  History of adenocarcinoma ascending colon status post right colectomy -Currently no acute issues  CKD stage IIIb -Creatinine currently stable, close to baseline, 1.4-1.8 -Creatinine 1.1  Chronic back pain Continue lidocaine patches  Code Status: DNR DVT Prophylaxis: SCDs Family Communication: Discussed all imaging results, lab results, explained to the patient and son over the phone    Disposition Plan: Patient from home with hospice.  Anticipated discharge to home with hospice after Pleurx catheter drainage placement. Currently awaiting procedure. Pleurx catheter to be placed on 3/1.   Time Spent in minutes 35 minutes  Procedures:  US thoracentesis   Consultants:   Interventional radiology  Antimicrobials:   Anti-infectives (From admission, onward)   None         Medications  Scheduled Meds: . acetaminophen  325 mg Oral BID  . feeding supplement (ENSURE ENLIVE)  237 mL Oral BID  . fludrocortisone  200 mcg Oral Daily  . furosemide  40 mg Oral Daily  . lidocaine  1 patch Transdermal QHS  . midodrine  2.5 mg Oral BID WC  . mirtazapine  7.5 mg Oral  QHS  . pantoprazole  40 mg Oral Daily   Continuous Infusions: PRN Meds:.lactulose, morphine CONCENTRATE, ondansetron, polyethylene glycol      Subjective:   [name] was seen and examined today.  Somewhat sleepy at the time of examination but no complaints.  Patient denies dizziness, chest pain, N/V/D/C, new weakness, numbess, tingling.  No fevers or chills or acute events overnight. Objective:   Vitals:   02/10/20 1214 02/10/20 1336 02/10/20 2101 02/11/20 0600  BP: 119/83 135/84 99/60 110/71  Pulse:  73 79 72  Resp:  16 20 20   Temp:  98.3 F (36.8 C) 98.4 F (36.9 C) 97.9 F (36.6 C)  TempSrc:  Oral Oral Oral    SpO2: (!) 88% 100% 94% 100%  Weight:      Height:        Intake/Output Summary (Last 24 hours) at 02/11/2020 1248 Last data filed at 02/11/2020 1034 Gross per 24 hour  Intake 480 ml  Output --  Net 480 ml     Wt Readings from Last 3 Encounters:  02/09/20 59 kg  01/18/20 66.5 kg  01/06/20 62.2 kg    Physical Exam  General: Somewhat sleepy but easily arousable, oriented, hard of hearing  Cardiovascular: S1 S2 clear, RRR. No pedal edema b/l  Respiratory: Diminished breath sounds right upper and lower lobe, no wheezing  Gastrointestinal: Soft, nontender, nondistended, NBS  Ext: 1+ pedal edema bilaterally  Neuro: no new deficits  Musculoskeletal: No cyanosis, clubbing  Skin: No rashes  Psych: Somewhat sleepy today     Data Reviewed:  I have personally reviewed following labs and imaging studies  Micro Results Recent Results (from the past 240 hour(s))  SARS CORONAVIRUS 2 (TAT 6-24 HRS) Nasopharyngeal Nasopharyngeal Swab     Status: None   Collection Time: 02/09/20  9:03 PM   Specimen: Nasopharyngeal Swab  Result Value Ref Range Status   SARS Coronavirus 2 NEGATIVE NEGATIVE Final    Comment: (NOTE) SARS-CoV-2 target nucleic acids are NOT DETECTED. The SARS-CoV-2 RNA is generally detectable in upper and lower respiratory specimens during the acute phase of infection. Negative results do not preclude SARS-CoV-2 infection, do not rule out co-infections with other pathogens, and should not be used as the sole basis for treatment or other patient management decisions. Negative results must be combined with clinical observations, patient history, and epidemiological information. The expected result is Negative. Fact Sheet for Patients: SugarRoll.be Fact Sheet for Healthcare Providers: https://www.woods-mathews.com/ This test is not yet approved or cleared by the [place] FDA and  has been authorized for detection  and/or diagnosis of SARS-CoV-2 by FDA under an Emergency Use Authorization (EUA). This EUA will remain  in effect (meaning this test can be used) for the duration of the COVID-19 declaration under Section 56 4(b)(1) of the Act, 21 U.S.C. section 360bbb-3(b)(1), unless the authorization is terminated or revoked sooner. Performed at [facility], [place] [address], [place], [place] [address]     Radiology Reports DG Chest 1 View  Result Date: 02/10/2020 CLINICAL DATA:  84 year old female status post thoracentesis EXAM: CHEST  1 VIEW COMPARISON:  02/09/2020 FINDINGS: Cardiomediastinal silhouette unchanged though partially obscured by overlying lung and pleural disease. Opacity in the right chest persists though decreased from the prior with no pneumothorax status post thoracentesis. Left lung relatively well aerated. IMPRESSION: Decreased right-sided pleural fluid status post thoracentesis with no pneumothorax. Electronically Signed   By: Corrie Mckusick D.O.   On: 02/10/2020 12:50   DG  Chest 2 View  Result Date: 02/03/2020 CLINICAL DATA:  Shortness of breath EXAM: CHEST - 2 VIEW COMPARISON:  01/17/2020 FINDINGS: Large right pleural effusion is new since the prior study. There is collapse of the right lower lobe and middle lobes. Left lung is clear. Cardiomediastinal contours are normal. IMPRESSION: New large right pleural effusion with collapse of the right middle and lower lobes. Electronically Signed   By: Ulyses Jarred M.D.   On: 02/03/2020 16:33   DG Lumbar Spine 1 View  Result Date: 01/18/2020 CLINICAL DATA:  Right-sided low back pain EXAM: LUMBAR SPINE - 1 VIEW COMPARISON:  08/14/2018 FINDINGS: Single view of the lumbar spine demonstrates lumbar dextrocurvature with advanced degenerative disc disease throughout the lumbar spine, not significantly changed when compared to the prior study. No fractures appreciated on frontal view. Bilateral SI joints appear intact. IMPRESSION: Stable lumbar  dextrocurvature with advanced degenerative disc disease throughout the lumbar spine. Electronically Signed   By: Davina Poke D.O.   On: 01/18/2020 12:55   DG Chest Portable 1 View  Result Date: 02/09/2020 CLINICAL DATA:  Shortness of breath and effusion EXAM: PORTABLE CHEST 1 VIEW COMPARISON:  February 03, 2020 FINDINGS: The heart size and mediastinal contours are partially obscured. There is near complete opacification of the right hemithorax with a small pocket of air seen within the right apex. The left lung is clear. No shift of the mediastinal structures. No acute osseous abnormality. IMPRESSION: Near complete opacification of the right hemithorax which is likely due to large pleural effusion and atelectasis. Electronically Signed   By: Prudencio Pair M.D.   On: 02/09/2020 19:29   DG CHEST PORT 1 VIEW  Result Date: 02/03/2020 CLINICAL DATA:  Status post right thoracentesis. EXAM: PORTABLE CHEST 1 VIEW COMPARISON:  Radiograph earlier this day. FINDINGS: Decreased right pleural effusion post thoracentesis, small to moderate volume of pleural fluid persists. Residual pleural fluid may be partially loculated and tracking laterally. Associated right basilar opacity. No definite pneumothorax. Left lung hypoaerated but clear. Heart size normal. No large left pleural effusion. IMPRESSION: 1. Decreased right pleural effusion post thoracentesis. No definite pneumothorax. 2. Associated right basilar opacity likely atelectasis. Electronically Signed   By: Keith Rake M.D.   On: 02/03/2020 19:37   DG Chest Portable 1 View  Result Date: 01/17/2020 CLINICAL DATA:  Right-sided abdominal and chest pain, weakness EXAM: PORTABLE CHEST 1 VIEW COMPARISON:  01/05/2020 FINDINGS: The heart size and mediastinal contours are within normal limits. Both lungs are clear. The visualized skeletal structures are unremarkable. IMPRESSION: No active disease. Electronically Signed   By: Randa Ngo M.D.   On: 01/17/2020  12:42   US THORACENTESIS ASP PLEURAL SPACE W/IMG GUIDE  Result Date: 02/10/2020 INDICATION: Cirrhosis, COPD, non-Hodgkin's lymphoma, colon cancer, chronic kidney disease, recurrent right pleural effusion/hepatic hydrothorax; request for therapeutic right thoracentesis. EXAM: ULTRASOUND GUIDED RIGHT THORACENTESIS MEDICATIONS: 1% Lidocaine, 10 mL COMPLICATIONS: None immediate. PROCEDURE: An ultrasound guided thoracentesis was thoroughly discussed with the patient and questions answered. The benefits, risks, alternatives and complications were also discussed. The patient understands and wishes to proceed with the procedure. Written consent was obtained. Ultrasound was performed to localize and mark an adequate pocket of fluid in the right chest. The area was then prepped and draped in the normal sterile fashion. 1% Lidocaine was used for local anesthesia. Under ultrasound guidance a 6 Fr Safe-T-Centesis catheter was introduced. Thoracentesis was performed. The catheter was removed and a dressing applied. FINDINGS: A total of approximately 1.2 L  of hazy yellow fluid was removed. IMPRESSION: Successful ultrasound guided right thoracentesis yielding 1.2 L of pleural fluid. Moderate effusion remaining to allow for placement for tunneled pleural catheter No pneumothorax on post-procedure chest x-ray. Read by: Gareth Eagle, PA-C Electronically Signed   By: Corrie Mckusick D.O.   On: 02/10/2020 13:00    Lab Data:  CBC: Recent Labs  Lab 02/09/20 1941 02/11/20 0517  WBC 3.1* 2.7*  HGB 11.9* 9.8*  HCT 34.5* 29.1*  MCV 102.1* 102.8*  PLT 138* 166*   Basic Metabolic Panel: Recent Labs  Lab 02/09/20 1941 02/11/20 0517  NA 138 139  K 3.2* 4.0  CL 105 107  CO2 25 27  GLUCOSE 95 94  BUN 32* 33*  CREATININE 1.43* 1.19*  CALCIUM 8.1* 7.9*   GFR: Estimated Creatinine Clearance: 30.7 mL/min (A) (by C-G formula based on SCr of 1.19 mg/dL (H)). Liver Function Tests: Recent Labs  Lab 02/09/20 1941  AST  69*  ALT 52*  ALKPHOS 548*  BILITOT 1.7*  PROT 5.5*  ALBUMIN 2.8*   No results for input(s): LIPASE, AMYLASE in the last 168 hours. No results for input(s): AMMONIA in the last 168 hours. Coagulation Profile: No results for input(s): INR, PROTIME in the last 168 hours. Cardiac Enzymes: No results for input(s): CKTOTAL, CKMB, CKMBINDEX, TROPONINI in the last 168 hours. BNP (last 3 results) No results for input(s): PROBNP in the last 8760 hours. HbA1C: No results for input(s): HGBA1C in the last 72 hours. CBG: No results for input(s): GLUCAP in the last 168 hours. Lipid Profile: No results for input(s): CHOL, HDL, LDLCALC, TRIG, CHOLHDL, LDLDIRECT in the last 72 hours. Thyroid Function Tests: No results for input(s): TSH, T4TOTAL, FREET4, T3FREE, THYROIDAB in the last 72 hours. Anemia Panel: No results for input(s): VITAMINB12, FOLATE, FERRITIN, TIBC, IRON, RETICCTPCT in the last 72 hours. Urine analysis:    Component Value Date/Time   COLORURINE YELLOW 01/01/2020 [place] 01/01/2020 1313   LABSPEC 1.008 01/01/2020 1313   PHURINE 5.0 01/01/2020 1313   GLUCOSEU NEGATIVE 01/01/2020 1313   HGBUR NEGATIVE 01/01/2020 1313   BILIRUBINUR NEGATIVE 01/01/2020 1313   KETONESUR NEGATIVE 01/01/2020 1313   PROTEINUR NEGATIVE 01/01/2020 1313   UROBILINOGEN 0.2 03/10/2009 1608   NITRITE NEGATIVE 01/01/2020 [place] 01/01/2020 1313     Sly Parlee M.D. [facility] 02/11/2020, 12:48 PM   Call night coverage person covering after 7pm

## 2020-02-11 NOTE — Progress Notes (Signed)
PCCM:  Patient seen and evaluated. Full consult note to follow.  Was planned for IPC with me on Tuesday of next week.  Planned IPC today at bedside.  Consented and discussed with son Carloyn Manner via phone  Garner Nash, DO [facility] 02/11/2020 4:57 PM

## 2020-02-11 NOTE — Procedures (Signed)
PleurX Catheter Instertion Procedure Note  Date of Operation: 02/11/2020  Pre-op Diagnosis: Hepatic hydrothorax  Post-op Diagnosis: Same  Surgeon: Garner Nash  Assistants: Aura Fey, MD   Anesthesia: Local, lidocaine   Meds Given: 1% lidocaine 5cc total  Operation: PleurX catheter insertion on the right   Estimated Blood Loss: 2cc  Complications: none noted  Indications and History: Erica Escobar is 84 y.o. with a history of malignant pleural effusion.  Recommendation was to perform Pleurx catheter insertion. The risks, benefits, potential complications, treatment options, and expected outcomes were discussed with the patient.  The possibilities of pneumothorax, infection, or damage to surrounding structures were discussed with the patient who freely signed the consent.    Description of Procedure: The patient was seen in the preoperative area, was examined and was deemed appropriate to proceed.  The patient was at bedside, identified as Erica Escobar with date of birth 1935-12-25  and MRN [id number], and the procedure verified as Pleurx catheter placement.  A Time Out was held and the above information confirmed.   Conscious sedation was initiated as indicated above.  With the patient in the semirecumbent position, ultrasound was used to locate fluid in the pleural space.  The site was marked and the area thoroughly cleaned with chlorhexidine.  The area was anesthetized with 1% lidocaine, including through subcutaneous tissues to the pleura.  Pleural fluid was withdrawn with the finder needle.  The procedure needle was advanced through the same tract into the pleural fluid, where fluid was again withdrawn.  The guidewire was advanced into the pleural space with subsequent removal of the needle.  A few centimeters anteriorly a skin incision was made, followed by a skin incision by the guidewire.  A Pleurx catheter attached to a soft tissue dissector was advanced through the  anterior incision with tunneling through the subcutaneous tissues to the posterior incision.  The Pleurx catheter was placed with the cuff appropriately about 1 cm from the anterior incision.  The soft tissue dissector was separated from a the Pleurx catheter.  Dilator was advanced over the guidewire into the pleural space and withdrawn.  The guidewire and peel-away catheter were advanced over the guidewire into the pleural space, followed by removal of the dilator and guidewire while leaving the peel-away catheter in place.  The Pleurx catheter was then advanced through the peel-away catheter with the catheter removed in its entirety. The pleurX catheter was connected to a sterile pleurX bottle with successful drainage of pleural fluid. The catheter was secured in place with sutures. The posterior incision was closed with 1 suture. Following successful drainage of 1600cc of pleural fluid, the bottle was removed from the catheter. A sterile cap was placed over the catheter opening and a sterile dressing was applied.   Samples: 1. No additional samples needed at this time.   Plans:  Home health has been ordered, as well as bottles for home drainage. Follow up in Pulmonary clinic has been arranged on March 9th/10th.   Octavio Graves Antwone Capozzoli 02/11/20 4:53 PM [facility]

## 2020-02-12 ENCOUNTER — Telehealth: Payer: Self-pay | Admitting: *Deleted

## 2020-02-12 ENCOUNTER — Encounter: Payer: Self-pay | Admitting: *Deleted

## 2020-02-12 ENCOUNTER — Telehealth: Payer: Self-pay | Admitting: Oncology

## 2020-02-12 ENCOUNTER — Telehealth: Payer: Self-pay | Admitting: Pulmonary Disease

## 2020-02-12 DIAGNOSIS — J910 Malignant pleural effusion: Secondary | ICD-10-CM

## 2020-02-12 NOTE — Telephone Encounter (Signed)
PCCM:  Please schedule office visit for suture removal, drainage and wound check with Dr. Tamala Julian on March 9/10th in office.   Thanks  Garner Nash, DO [facility] 02/12/2020 4:53 PM

## 2020-02-12 NOTE — Telephone Encounter (Signed)
Cancelled appt per 3/1 sch msg. Pt's daughter in-law  is aware of the cancellation.

## 2020-02-12 NOTE — Telephone Encounter (Signed)
----- Message from Candee Furbish, MD sent at 02/11/2020  5:07 PM EST ----- Regarding: RE: Pleurex She's already in hospice so can arrange with them if they are willing. ----- Message ----- From: Garner Nash, DO Sent: 02/07/2020  12:40 PM EST To: Amado Coe, RN, Marshell Garfinkel, MD, # Subject: RE: Pleurex                                    I think so? Rowen Wilmer can try to schedule at [facility] if RT able to staff I think it could be done in ENDO at [facility] if they would let us Scheduling for outpatient pleurex is still a mess   Thanks Brad ----- Message ----- From: Marshell Garfinkel, MD Sent: 02/07/2020   9:34 AM EST To: Amado Coe, RN, Garner Nash, DO, # Subject: RE: Pleurex                                    If we are doing on 3/2 then can we schedule for 3 pm.   I am doing a bronch at [place] 1 pm and can come over to [facility] after. Can it be done at [facility]? ----- Message ----- From: Garner Nash, DO Sent: 02/07/2020   8:49 AM EST To: Amado Coe, RN, Marshell Garfinkel, MD, # Subject: RE: Pleurex                                    Yes, I am happy too. I saw her in the hospital on her initial consult. Sweet lady.   Sabatino Williard, this is still a work in progress for scheduling. But can you try to schedule the pleur-ex with me on Tuesday March 2nd? We will have to use RT department to schedule for the time being. We will need to arrange covid testing as well.   Thanks,  Brad      ----- Message ----- From: Candee Furbish, MD Sent: 02/07/2020   8:43 AM EST To: Amado Coe, RN, Garner Nash, DO, # Subject: FW: Pleurex                                    Can you guys help praveen get this set up? I'm off for next few days. Appreciated  Dan ----- Message ----- From: Marshell Garfinkel, MD Sent: 02/07/2020   8:39 AM EST To: Amado Coe, RN, Candee Furbish, MD Subject: Melton Alar: Pleurex                                    Dan-  This is the hepatic hydrothorax  on hospice with multiple thoras. I spoke to you about her last weekend. She was hesitant then but now wants to go ahead with the procedure. Can you see how it can be scheduled. I would like to do it with one of yall  ----- Message ----- From: Marshell Garfinkel, MD Sent: 02/06/2020   1:34 PM EST To: [facility] Procedure Subject: Pleurex  Can you arrange pleurex procedure for this patient at next available? Thanks

## 2020-02-12 NOTE — Telephone Encounter (Signed)
Called and spoke with Patient's son, Carloyn Manner ([place]).  Dr Juline Patch recommendations given.  Patient scheduled with Dr. Tamala Julian, 02/21/20 at 1630.  Carloyn Manner given office address and directions.  Nothing further at this time.

## 2020-02-12 NOTE — Telephone Encounter (Signed)
Called spoke with Son and Daughter in [employer]. Patient is setup with hospice out of [place] ([facility]).  The patient was not sent home with any supplies from hospital Patient was discharged 02/11/20 in the evening.   Called hospice, they are going to supply patient. They too had called and found out patient had no supplies.   Notified Dr. Valeta Harms and Dr. Tamala Julian for clarification for draining, Dr. Tamala Julian states to drain up to 1L daily at this time.   Called Crystal back at [employer] let her know the verbal order for draining. Read back order, verbalized understanding.  Nothing further needed at this time,.

## 2020-02-13 ENCOUNTER — Inpatient Hospital Stay (HOSPITAL_COMMUNITY): Admission: RE | Admit: 2020-02-13 | Payer: Medicare Other | Source: Ambulatory Visit

## 2020-02-13 ENCOUNTER — Encounter (HOSPITAL_COMMUNITY): Admission: RE | Payer: Self-pay | Source: Home / Self Care

## 2020-02-13 ENCOUNTER — Ambulatory Visit (HOSPITAL_COMMUNITY): Admission: RE | Admit: 2020-02-13 | Payer: Medicare Other | Source: Home / Self Care | Admitting: Pulmonary Disease

## 2020-02-13 SURGERY — INSERTION, PLEURAL DRAINAGE CATHETER
Anesthesia: Moderate Sedation

## 2020-02-14 ENCOUNTER — Ambulatory Visit: Payer: Medicare Other | Admitting: Nurse Practitioner

## 2020-02-19 ENCOUNTER — Telehealth: Payer: Self-pay | Admitting: Internal Medicine

## 2020-02-19 LAB — ACID FAST CULTURE WITH REFLEXED SENSITIVITIES (MYCOBACTERIA): Acid Fast Culture: NEGATIVE

## 2020-02-19 NOTE — Telephone Encounter (Signed)
Okay for hospice folks to take out suture, just need scissors Can try draining every other day, it really depends on her level of shortness of breath. If short of breath when draining every other day, then drain every day.  Thanks

## 2020-02-19 NOTE — Telephone Encounter (Signed)
I called and spoke with Erica Escobar and made her aware that I will be reaching out to Dr. Tamala Julian and get back to her.  Dr. Tamala Julian, Please advise on a recommendation for patient and her suture removal plan of action and the amount of drainage she is getting.

## 2020-02-19 NOTE — Telephone Encounter (Signed)
LMTCB x 1 

## 2020-02-20 NOTE — Telephone Encounter (Signed)
LMTCB x2 for [employer].

## 2020-02-21 ENCOUNTER — Ambulatory Visit: Payer: Medicare Other | Admitting: Internal Medicine

## 2020-02-21 NOTE — Telephone Encounter (Signed)
LMTCB x3 for [employer]. We have attempted to contact [facility] several times with no success or call back from her. Per triage protocol, message will be closed.

## 2020-03-14 DEATH — deceased

## 2020-03-21 ENCOUNTER — Inpatient Hospital Stay: Payer: Medicare Other

## 2020-03-21 ENCOUNTER — Inpatient Hospital Stay: Payer: Medicare Other | Attending: Oncology | Admitting: Oncology

## 2020-03-21 ENCOUNTER — Telehealth: Payer: Self-pay | Admitting: *Deleted

## 2020-03-21 NOTE — Telephone Encounter (Signed)
Called son to f/u on missed appointment today. He reports she died last week in hospice home.
